# Patient Record
Sex: Female | Born: 1980
Health system: Southern US, Community
[De-identification: ages and names within clinical notes are randomized; demographics above are authoritative.]

## PROBLEM LIST (undated history)

## (undated) ENCOUNTER — Emergency Department (HOSPITAL_BASED_OUTPATIENT_CLINIC_OR_DEPARTMENT_OTHER): Admission: EM | Payer: BLUE CROSS/BLUE SHIELD | Source: Home / Self Care

## (undated) DIAGNOSIS — M1990 Unspecified osteoarthritis, unspecified site: Secondary | ICD-10-CM

## (undated) DIAGNOSIS — G932 Benign intracranial hypertension: Secondary | ICD-10-CM

## (undated) DIAGNOSIS — Z87442 Personal history of urinary calculi: Secondary | ICD-10-CM

## (undated) DIAGNOSIS — Z9889 Other specified postprocedural states: Secondary | ICD-10-CM

## (undated) DIAGNOSIS — Z8719 Personal history of other diseases of the digestive system: Secondary | ICD-10-CM

## (undated) DIAGNOSIS — G43909 Migraine, unspecified, not intractable, without status migrainosus: Secondary | ICD-10-CM

## (undated) DIAGNOSIS — F32A Depression, unspecified: Secondary | ICD-10-CM

## (undated) DIAGNOSIS — F419 Anxiety disorder, unspecified: Secondary | ICD-10-CM

## (undated) DIAGNOSIS — M722 Plantar fascial fibromatosis: Secondary | ICD-10-CM

## (undated) DIAGNOSIS — R112 Nausea with vomiting, unspecified: Secondary | ICD-10-CM

## (undated) DIAGNOSIS — Z8711 Personal history of peptic ulcer disease: Secondary | ICD-10-CM

## (undated) DIAGNOSIS — E785 Hyperlipidemia, unspecified: Secondary | ICD-10-CM

## (undated) DIAGNOSIS — T7840XA Allergy, unspecified, initial encounter: Secondary | ICD-10-CM

## (undated) DIAGNOSIS — D649 Anemia, unspecified: Secondary | ICD-10-CM

## (undated) DIAGNOSIS — J329 Chronic sinusitis, unspecified: Secondary | ICD-10-CM

## (undated) DIAGNOSIS — Z8489 Family history of other specified conditions: Secondary | ICD-10-CM

## (undated) DIAGNOSIS — F329 Major depressive disorder, single episode, unspecified: Secondary | ICD-10-CM

## (undated) DIAGNOSIS — K589 Irritable bowel syndrome without diarrhea: Secondary | ICD-10-CM

## (undated) DIAGNOSIS — M869 Osteomyelitis, unspecified: Secondary | ICD-10-CM

## (undated) DIAGNOSIS — T17308A Unspecified foreign body in larynx causing other injury, initial encounter: Secondary | ICD-10-CM

## (undated) DIAGNOSIS — I739 Peripheral vascular disease, unspecified: Secondary | ICD-10-CM

## (undated) DIAGNOSIS — R1011 Right upper quadrant pain: Secondary | ICD-10-CM

## (undated) DIAGNOSIS — Z972 Presence of dental prosthetic device (complete) (partial): Secondary | ICD-10-CM

## (undated) DIAGNOSIS — F909 Attention-deficit hyperactivity disorder, unspecified type: Secondary | ICD-10-CM

## (undated) DIAGNOSIS — G4733 Obstructive sleep apnea (adult) (pediatric): Secondary | ICD-10-CM

## (undated) DIAGNOSIS — K219 Gastro-esophageal reflux disease without esophagitis: Secondary | ICD-10-CM

## (undated) DIAGNOSIS — N393 Stress incontinence (female) (male): Secondary | ICD-10-CM

## (undated) DIAGNOSIS — N946 Dysmenorrhea, unspecified: Secondary | ICD-10-CM

## (undated) DIAGNOSIS — M899 Disorder of bone, unspecified: Secondary | ICD-10-CM

## (undated) DIAGNOSIS — Z973 Presence of spectacles and contact lenses: Secondary | ICD-10-CM

## (undated) DIAGNOSIS — M949 Disorder of cartilage, unspecified: Secondary | ICD-10-CM

## (undated) HISTORY — DX: Major depressive disorder, single episode, unspecified: F32.9

## (undated) HISTORY — DX: Hyperlipidemia, unspecified: E78.5

## (undated) HISTORY — DX: Depression, unspecified: F32.A

## (undated) HISTORY — PX: ANKLE ARTHROSCOPY: SHX545

## (undated) HISTORY — DX: Allergy, unspecified, initial encounter: T78.40XA

## (undated) HISTORY — DX: Right upper quadrant pain: R10.11

## (undated) HISTORY — DX: Irritable bowel syndrome, unspecified: K58.9

## (undated) HISTORY — DX: Attention-deficit hyperactivity disorder, unspecified type: F90.9

## (undated) HISTORY — PX: BLADDER SURGERY: SHX569

## (undated) HISTORY — DX: Unspecified foreign body in larynx causing other injury, initial encounter: T17.308A

## (undated) HISTORY — PX: TONSILLECTOMY AND ADENOIDECTOMY: SUR1326

## (undated) HISTORY — PX: WISDOM TOOTH EXTRACTION: SHX21

---
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
## 2001-03-22 ENCOUNTER — Other Ambulatory Visit: Admission: RE | Admit: 2001-03-22 | Discharge: 2001-03-22 | Payer: Self-pay | Admitting: Obstetrics and Gynecology

## 2001-03-23 ENCOUNTER — Encounter (INDEPENDENT_AMBULATORY_CARE_PROVIDER_SITE_OTHER): Payer: Self-pay | Admitting: Specialist

## 2001-03-23 ENCOUNTER — Other Ambulatory Visit: Admission: RE | Admit: 2001-03-23 | Discharge: 2001-03-23 | Payer: Self-pay | Admitting: Obstetrics and Gynecology

## 2001-11-15 ENCOUNTER — Other Ambulatory Visit: Admission: RE | Admit: 2001-11-15 | Discharge: 2001-11-15 | Payer: Self-pay | Admitting: Obstetrics and Gynecology

## 2001-12-29 ENCOUNTER — Other Ambulatory Visit: Admission: RE | Admit: 2001-12-29 | Discharge: 2001-12-29 | Payer: Self-pay | Admitting: Obstetrics and Gynecology

## 2002-01-28 ENCOUNTER — Ambulatory Visit (HOSPITAL_COMMUNITY): Admission: RE | Admit: 2002-01-28 | Discharge: 2002-01-28 | Payer: Self-pay | Admitting: Gastroenterology

## 2002-01-29 ENCOUNTER — Emergency Department (HOSPITAL_COMMUNITY): Admission: EM | Admit: 2002-01-29 | Discharge: 2002-01-29 | Payer: Self-pay | Admitting: Emergency Medicine

## 2002-03-31 ENCOUNTER — Ambulatory Visit (HOSPITAL_COMMUNITY): Admission: RE | Admit: 2002-03-31 | Discharge: 2002-03-31 | Payer: Self-pay | Admitting: Gastroenterology

## 2002-09-29 ENCOUNTER — Other Ambulatory Visit: Admission: RE | Admit: 2002-09-29 | Discharge: 2002-09-29 | Payer: Self-pay | Admitting: Obstetrics and Gynecology

## 2002-11-07 ENCOUNTER — Emergency Department (HOSPITAL_COMMUNITY): Admission: EM | Admit: 2002-11-07 | Discharge: 2002-11-07 | Payer: Self-pay | Admitting: Emergency Medicine

## 2003-03-02 ENCOUNTER — Ambulatory Visit (HOSPITAL_COMMUNITY): Admission: RE | Admit: 2003-03-02 | Discharge: 2003-03-02 | Payer: Self-pay | Admitting: Pediatrics

## 2003-03-23 ENCOUNTER — Other Ambulatory Visit: Admission: RE | Admit: 2003-03-23 | Discharge: 2003-03-23 | Payer: Self-pay | Admitting: Obstetrics and Gynecology

## 2004-03-20 ENCOUNTER — Observation Stay (HOSPITAL_COMMUNITY): Admission: AD | Admit: 2004-03-20 | Discharge: 2004-03-21 | Payer: Self-pay | Admitting: Pediatrics

## 2004-06-19 ENCOUNTER — Encounter: Admission: RE | Admit: 2004-06-19 | Discharge: 2004-06-19 | Payer: Self-pay | Admitting: Internal Medicine

## 2004-08-19 ENCOUNTER — Other Ambulatory Visit: Admission: RE | Admit: 2004-08-19 | Discharge: 2004-08-19 | Payer: Self-pay | Admitting: Obstetrics and Gynecology

## 2004-10-16 ENCOUNTER — Ambulatory Visit: Payer: Self-pay | Admitting: Family Medicine

## 2004-11-13 ENCOUNTER — Ambulatory Visit: Payer: Self-pay | Admitting: Internal Medicine

## 2004-12-25 ENCOUNTER — Encounter: Admission: RE | Admit: 2004-12-25 | Discharge: 2004-12-25 | Payer: Self-pay | Admitting: Obstetrics and Gynecology

## 2005-01-11 ENCOUNTER — Ambulatory Visit: Payer: Self-pay | Admitting: Family Medicine

## 2005-01-19 ENCOUNTER — Emergency Department (HOSPITAL_COMMUNITY): Admission: EM | Admit: 2005-01-19 | Discharge: 2005-01-20 | Payer: Self-pay | Admitting: Emergency Medicine

## 2005-01-20 ENCOUNTER — Emergency Department (HOSPITAL_COMMUNITY): Admission: EM | Admit: 2005-01-20 | Discharge: 2005-01-20 | Payer: Self-pay | Admitting: Emergency Medicine

## 2005-01-31 ENCOUNTER — Ambulatory Visit: Payer: Self-pay | Admitting: Internal Medicine

## 2005-04-22 ENCOUNTER — Encounter: Admission: RE | Admit: 2005-04-22 | Discharge: 2005-04-22 | Payer: Self-pay | Admitting: Obstetrics and Gynecology

## 2005-04-24 ENCOUNTER — Ambulatory Visit: Payer: Self-pay | Admitting: Internal Medicine

## 2005-07-04 ENCOUNTER — Ambulatory Visit: Payer: Self-pay | Admitting: Hematology & Oncology

## 2005-09-02 ENCOUNTER — Ambulatory Visit: Payer: Self-pay | Admitting: Hematology & Oncology

## 2005-10-31 ENCOUNTER — Ambulatory Visit: Payer: Self-pay | Admitting: Hematology & Oncology

## 2005-11-01 ENCOUNTER — Emergency Department (HOSPITAL_COMMUNITY): Admission: EM | Admit: 2005-11-01 | Discharge: 2005-11-02 | Payer: Self-pay | Admitting: Emergency Medicine

## 2005-11-03 ENCOUNTER — Inpatient Hospital Stay (HOSPITAL_COMMUNITY): Admission: AD | Admit: 2005-11-03 | Discharge: 2005-11-06 | Payer: Self-pay | Admitting: Pediatrics

## 2005-12-03 ENCOUNTER — Ambulatory Visit: Payer: Self-pay | Admitting: Internal Medicine

## 2006-02-13 ENCOUNTER — Ambulatory Visit: Payer: Self-pay | Admitting: Internal Medicine

## 2006-07-31 ENCOUNTER — Ambulatory Visit: Payer: Self-pay | Admitting: Internal Medicine

## 2006-07-31 ENCOUNTER — Encounter: Admission: RE | Admit: 2006-07-31 | Discharge: 2006-07-31 | Payer: Self-pay | Admitting: Internal Medicine

## 2006-08-01 ENCOUNTER — Emergency Department (HOSPITAL_COMMUNITY): Admission: EM | Admit: 2006-08-01 | Discharge: 2006-08-02 | Payer: Self-pay | Admitting: Emergency Medicine

## 2006-08-01 ENCOUNTER — Ambulatory Visit: Payer: Self-pay | Admitting: Family Medicine

## 2006-08-10 ENCOUNTER — Ambulatory Visit (HOSPITAL_BASED_OUTPATIENT_CLINIC_OR_DEPARTMENT_OTHER): Admission: RE | Admit: 2006-08-10 | Discharge: 2006-08-10 | Payer: Self-pay | Admitting: Urology

## 2006-08-10 HISTORY — PX: CYSTOSCOPY WITH RETROGRADE PYELOGRAM, URETEROSCOPY AND STENT PLACEMENT: SHX5789

## 2006-12-17 ENCOUNTER — Ambulatory Visit: Payer: Self-pay | Admitting: Internal Medicine

## 2007-03-05 HISTORY — PX: OVARIAN CYST SURGERY: SHX726

## 2007-03-09 ENCOUNTER — Ambulatory Visit: Payer: Self-pay | Admitting: Internal Medicine

## 2007-06-07 ENCOUNTER — Telehealth: Payer: Self-pay | Admitting: Internal Medicine

## 2007-07-06 ENCOUNTER — Telehealth: Payer: Self-pay | Admitting: Internal Medicine

## 2007-07-09 DIAGNOSIS — R51 Headache: Secondary | ICD-10-CM | POA: Insufficient documentation

## 2007-07-09 DIAGNOSIS — R519 Headache, unspecified: Secondary | ICD-10-CM

## 2007-07-09 HISTORY — DX: Headache, unspecified: R51.9

## 2007-07-15 ENCOUNTER — Ambulatory Visit: Payer: Self-pay | Admitting: Internal Medicine

## 2007-07-17 ENCOUNTER — Emergency Department (HOSPITAL_COMMUNITY): Admission: EM | Admit: 2007-07-17 | Discharge: 2007-07-17 | Payer: Self-pay | Admitting: Emergency Medicine

## 2007-08-18 ENCOUNTER — Ambulatory Visit: Payer: Self-pay | Admitting: Internal Medicine

## 2007-11-09 ENCOUNTER — Ambulatory Visit: Payer: Self-pay | Admitting: Internal Medicine

## 2007-11-09 DIAGNOSIS — F411 Generalized anxiety disorder: Secondary | ICD-10-CM | POA: Insufficient documentation

## 2007-11-23 ENCOUNTER — Telehealth: Payer: Self-pay | Admitting: Internal Medicine

## 2007-12-14 ENCOUNTER — Ambulatory Visit: Payer: Self-pay | Admitting: Internal Medicine

## 2007-12-21 ENCOUNTER — Encounter: Payer: Self-pay | Admitting: Internal Medicine

## 2007-12-24 ENCOUNTER — Encounter: Payer: Self-pay | Admitting: Internal Medicine

## 2007-12-25 ENCOUNTER — Emergency Department (HOSPITAL_COMMUNITY): Admission: EM | Admit: 2007-12-25 | Discharge: 2007-12-26 | Payer: Self-pay | Admitting: Emergency Medicine

## 2008-01-04 ENCOUNTER — Telehealth: Payer: Self-pay | Admitting: Internal Medicine

## 2008-01-08 ENCOUNTER — Ambulatory Visit: Payer: Self-pay | Admitting: Internal Medicine

## 2008-01-09 ENCOUNTER — Telehealth: Payer: Self-pay | Admitting: Internal Medicine

## 2008-01-11 ENCOUNTER — Ambulatory Visit: Payer: Self-pay | Admitting: Internal Medicine

## 2008-01-18 ENCOUNTER — Telehealth: Payer: Self-pay | Admitting: Internal Medicine

## 2008-01-18 ENCOUNTER — Encounter: Payer: Self-pay | Admitting: Internal Medicine

## 2008-01-20 ENCOUNTER — Ambulatory Visit: Payer: Self-pay | Admitting: Internal Medicine

## 2008-01-25 ENCOUNTER — Ambulatory Visit: Payer: Self-pay | Admitting: Internal Medicine

## 2008-01-25 DIAGNOSIS — R635 Abnormal weight gain: Secondary | ICD-10-CM | POA: Insufficient documentation

## 2008-01-25 DIAGNOSIS — E669 Obesity, unspecified: Secondary | ICD-10-CM | POA: Insufficient documentation

## 2008-01-26 ENCOUNTER — Telehealth (INDEPENDENT_AMBULATORY_CARE_PROVIDER_SITE_OTHER): Payer: Self-pay | Admitting: *Deleted

## 2008-01-27 LAB — CONVERTED CEMR LAB
ALT: 95 units/L — ABNORMAL HIGH (ref 0–35)
AST: 68 units/L — ABNORMAL HIGH (ref 0–37)
Alkaline Phosphatase: 74 units/L (ref 39–117)
BUN: 13 mg/dL (ref 6–23)
Basophils Absolute: 0 10*3/uL (ref 0.0–0.1)
Calcium: 9.3 mg/dL (ref 8.4–10.5)
Creatinine, Ser: 0.7 mg/dL (ref 0.4–1.2)
Eosinophils Absolute: 0.4 10*3/uL (ref 0.0–0.6)
GFR calc Af Amer: 131 mL/min
Glucose, Bld: 100 mg/dL — ABNORMAL HIGH (ref 70–99)
MCHC: 33.8 g/dL (ref 30.0–36.0)
MCV: 88.3 fL (ref 78.0–100.0)
Monocytes Absolute: 0.3 10*3/uL (ref 0.2–0.7)
Neutro Abs: 5.4 10*3/uL (ref 1.4–7.7)
Potassium: 4.4 meq/L (ref 3.5–5.1)
RDW: 13.6 % (ref 11.5–14.6)
Sodium: 137 meq/L (ref 135–145)
Total Protein: 6.5 g/dL (ref 6.0–8.3)

## 2008-02-09 ENCOUNTER — Encounter: Payer: Self-pay | Admitting: Internal Medicine

## 2008-02-14 ENCOUNTER — Ambulatory Visit: Payer: Self-pay | Admitting: Internal Medicine

## 2008-02-14 LAB — CONVERTED CEMR LAB
Bilirubin Urine: NEGATIVE
Inflenza A Ag: NEGATIVE
Nitrite: NEGATIVE
Urobilinogen, UA: 0.2

## 2008-02-16 ENCOUNTER — Ambulatory Visit: Payer: Self-pay | Admitting: Internal Medicine

## 2008-02-17 ENCOUNTER — Telehealth: Payer: Self-pay | Admitting: Internal Medicine

## 2008-02-18 LAB — CONVERTED CEMR LAB
ALT: 21 units/L (ref 0–35)
Alkaline Phosphatase: 61 units/L (ref 39–117)
Basophils Absolute: 0 10*3/uL (ref 0.0–0.1)
CO2: 28 meq/L (ref 19–32)
Calcium: 8.9 mg/dL (ref 8.4–10.5)
Eosinophils Absolute: 0.3 10*3/uL (ref 0.0–0.7)
Eosinophils Relative: 4.7 % (ref 0.0–5.0)
GFR calc Af Amer: 98 mL/min
Glucose, Bld: 95 mg/dL (ref 70–99)
Hemoglobin: 12.4 g/dL (ref 12.0–15.0)
Lymphocytes Relative: 33.8 % (ref 12.0–46.0)
Monocytes Absolute: 0.5 10*3/uL (ref 0.1–1.0)
Monocytes Relative: 8.5 % (ref 3.0–12.0)
Neutro Abs: 2.8 10*3/uL (ref 1.4–7.7)
Sodium: 137 meq/L (ref 135–145)

## 2008-03-08 ENCOUNTER — Encounter: Payer: Self-pay | Admitting: Internal Medicine

## 2008-03-11 ENCOUNTER — Emergency Department (HOSPITAL_COMMUNITY): Admission: EM | Admit: 2008-03-11 | Discharge: 2008-03-11 | Payer: Self-pay | Admitting: Emergency Medicine

## 2008-03-16 ENCOUNTER — Telehealth: Payer: Self-pay | Admitting: Internal Medicine

## 2008-03-28 ENCOUNTER — Ambulatory Visit: Payer: Self-pay | Admitting: Internal Medicine

## 2008-03-28 DIAGNOSIS — M549 Dorsalgia, unspecified: Secondary | ICD-10-CM | POA: Insufficient documentation

## 2008-03-28 LAB — CONVERTED CEMR LAB
Bilirubin Urine: NEGATIVE
Blood in Urine, dipstick: NEGATIVE
Glucose, Urine, Semiquant: NEGATIVE
Nitrite: NEGATIVE
Protein, U semiquant: NEGATIVE
Urobilinogen, UA: 0.2
pH: 5.5

## 2008-04-13 ENCOUNTER — Encounter: Admission: RE | Admit: 2008-04-13 | Discharge: 2008-04-13 | Payer: Self-pay | Admitting: Neurology

## 2008-04-17 ENCOUNTER — Telehealth: Payer: Self-pay | Admitting: Internal Medicine

## 2008-04-18 ENCOUNTER — Ambulatory Visit (HOSPITAL_COMMUNITY): Admission: RE | Admit: 2008-04-18 | Discharge: 2008-04-18 | Payer: Self-pay | Admitting: Neurology

## 2008-04-20 ENCOUNTER — Encounter: Payer: Self-pay | Admitting: Internal Medicine

## 2008-04-24 ENCOUNTER — Encounter (INDEPENDENT_AMBULATORY_CARE_PROVIDER_SITE_OTHER): Payer: Self-pay | Admitting: Radiology

## 2008-04-24 ENCOUNTER — Ambulatory Visit (HOSPITAL_COMMUNITY): Admission: RE | Admit: 2008-04-24 | Discharge: 2008-04-24 | Payer: Self-pay | Admitting: Neurology

## 2008-04-25 ENCOUNTER — Encounter: Payer: Self-pay | Admitting: Internal Medicine

## 2008-05-02 ENCOUNTER — Encounter: Admission: RE | Admit: 2008-05-02 | Discharge: 2008-05-02 | Payer: Self-pay | Admitting: Neurology

## 2008-05-03 ENCOUNTER — Telehealth: Payer: Self-pay | Admitting: Internal Medicine

## 2008-05-11 ENCOUNTER — Encounter: Payer: Self-pay | Admitting: Internal Medicine

## 2008-05-16 ENCOUNTER — Encounter: Payer: Self-pay | Admitting: Internal Medicine

## 2008-05-23 ENCOUNTER — Encounter: Payer: Self-pay | Admitting: Internal Medicine

## 2008-05-24 ENCOUNTER — Encounter: Payer: Self-pay | Admitting: Internal Medicine

## 2008-06-08 ENCOUNTER — Encounter: Payer: Self-pay | Admitting: Internal Medicine

## 2008-06-16 ENCOUNTER — Ambulatory Visit: Payer: Self-pay | Admitting: Internal Medicine

## 2008-06-16 DIAGNOSIS — G932 Benign intracranial hypertension: Secondary | ICD-10-CM

## 2008-06-16 HISTORY — DX: Benign intracranial hypertension: G93.2

## 2008-06-16 LAB — CONVERTED CEMR LAB
Bilirubin Urine: NEGATIVE
Ketones, urine, test strip: NEGATIVE
Protein, U semiquant: NEGATIVE
pH: 5.5

## 2008-06-18 LAB — CONVERTED CEMR LAB
BUN: 15 mg/dL (ref 6–23)
Calcium: 8.6 mg/dL (ref 8.4–10.5)
Chloride: 111 meq/L (ref 96–112)
Creatinine, Ser: 0.68 mg/dL (ref 0.40–1.20)
Eosinophils Absolute: 0.5 10*3/uL (ref 0.0–0.7)
Eosinophils Relative: 8 % — ABNORMAL HIGH (ref 0–5)
HCT: 33.5 % — ABNORMAL LOW (ref 36.0–46.0)
Sodium, Ur: 58 meq/L
Sodium: 138 meq/L (ref 135–145)

## 2008-06-21 ENCOUNTER — Telehealth: Payer: Self-pay | Admitting: Internal Medicine

## 2008-06-22 ENCOUNTER — Encounter: Payer: Self-pay | Admitting: Internal Medicine

## 2008-06-26 ENCOUNTER — Encounter: Payer: Self-pay | Admitting: Internal Medicine

## 2008-06-30 ENCOUNTER — Emergency Department (HOSPITAL_COMMUNITY): Admission: EM | Admit: 2008-06-30 | Discharge: 2008-06-30 | Payer: Self-pay | Admitting: Emergency Medicine

## 2008-07-07 ENCOUNTER — Encounter: Payer: Self-pay | Admitting: Internal Medicine

## 2008-09-01 ENCOUNTER — Ambulatory Visit: Payer: Self-pay | Admitting: Family Medicine

## 2008-09-01 DIAGNOSIS — J209 Acute bronchitis, unspecified: Secondary | ICD-10-CM | POA: Insufficient documentation

## 2008-09-29 ENCOUNTER — Telehealth: Payer: Self-pay | Admitting: Family Medicine

## 2008-10-02 ENCOUNTER — Ambulatory Visit: Payer: Self-pay | Admitting: Family Medicine

## 2008-10-02 DIAGNOSIS — F1191 Opioid use, unspecified, in remission: Secondary | ICD-10-CM | POA: Insufficient documentation

## 2008-10-02 DIAGNOSIS — F1120 Opioid dependence, uncomplicated: Secondary | ICD-10-CM | POA: Insufficient documentation

## 2008-10-02 DIAGNOSIS — F32A Depression, unspecified: Secondary | ICD-10-CM | POA: Insufficient documentation

## 2008-10-02 DIAGNOSIS — F322 Major depressive disorder, single episode, severe without psychotic features: Secondary | ICD-10-CM | POA: Insufficient documentation

## 2008-10-04 ENCOUNTER — Ambulatory Visit: Payer: Self-pay | Admitting: Family Medicine

## 2008-10-06 ENCOUNTER — Telehealth: Payer: Self-pay | Admitting: Family Medicine

## 2008-10-09 ENCOUNTER — Telehealth: Payer: Self-pay | Admitting: Family Medicine

## 2008-10-23 ENCOUNTER — Encounter: Payer: Self-pay | Admitting: Family Medicine

## 2008-11-21 ENCOUNTER — Ambulatory Visit: Payer: Self-pay | Admitting: Family Medicine

## 2008-11-29 ENCOUNTER — Ambulatory Visit: Payer: Self-pay | Admitting: Family Medicine

## 2008-12-26 ENCOUNTER — Ambulatory Visit: Payer: Self-pay | Admitting: Family Medicine

## 2009-01-15 ENCOUNTER — Other Ambulatory Visit: Admission: RE | Admit: 2009-01-15 | Discharge: 2009-01-15 | Payer: Self-pay | Admitting: Obstetrics & Gynecology

## 2009-05-11 ENCOUNTER — Ambulatory Visit (HOSPITAL_COMMUNITY): Admission: RE | Admit: 2009-05-11 | Discharge: 2009-05-11 | Payer: Self-pay | Admitting: Obstetrics & Gynecology

## 2009-08-03 ENCOUNTER — Ambulatory Visit: Payer: Self-pay | Admitting: Family Medicine

## 2009-08-18 ENCOUNTER — Emergency Department (HOSPITAL_COMMUNITY): Admission: EM | Admit: 2009-08-18 | Discharge: 2009-08-19 | Payer: Self-pay | Admitting: Emergency Medicine

## 2009-12-24 ENCOUNTER — Inpatient Hospital Stay (HOSPITAL_COMMUNITY): Admission: RE | Admit: 2009-12-24 | Discharge: 2009-12-27 | Payer: Self-pay | Admitting: Obstetrics and Gynecology

## 2009-12-30 ENCOUNTER — Encounter: Admission: RE | Admit: 2009-12-30 | Discharge: 2010-01-07 | Payer: Self-pay | Admitting: Obstetrics and Gynecology

## 2010-04-02 ENCOUNTER — Telehealth: Payer: Self-pay | Admitting: Family Medicine

## 2010-05-01 ENCOUNTER — Telehealth: Payer: Self-pay | Admitting: Family Medicine

## 2010-07-01 ENCOUNTER — Encounter: Admission: RE | Admit: 2010-07-01 | Discharge: 2010-07-01 | Payer: Self-pay | Admitting: Obstetrics and Gynecology

## 2010-07-02 ENCOUNTER — Encounter: Admission: RE | Admit: 2010-07-02 | Discharge: 2010-07-02 | Payer: Self-pay | Admitting: Obstetrics and Gynecology

## 2010-11-29 ENCOUNTER — Ambulatory Visit
Admission: RE | Admit: 2010-11-29 | Discharge: 2010-11-29 | Payer: Self-pay | Source: Home / Self Care | Attending: Family Medicine | Admitting: Family Medicine

## 2010-12-05 ENCOUNTER — Ambulatory Visit
Admission: RE | Admit: 2010-12-05 | Discharge: 2010-12-05 | Payer: Self-pay | Source: Home / Self Care | Attending: Internal Medicine | Admitting: Internal Medicine

## 2010-12-08 ENCOUNTER — Encounter: Payer: Self-pay | Admitting: Internal Medicine

## 2010-12-09 ENCOUNTER — Encounter: Payer: Self-pay | Admitting: Obstetrics & Gynecology

## 2010-12-16 ENCOUNTER — Telehealth: Payer: Self-pay | Admitting: Family Medicine

## 2010-12-17 NOTE — Progress Notes (Signed)
Summary: refill Topamax  Phone Note Call from Patient   Caller: Patient Call For: [name] Summary of Call: Pt is asking that [name] refill her Topamax.  She is completely out, and her Neurologist will not fill it until she sees them.  They gave her an appt in 2 weeks, and she cannot wait that long. Please call. [phone] Initial call taken by: Lynann Beaver CMA,  Apr 02, 2010 12:24 PM  Follow-up for Phone Call        what is her dose?  Topamax 200 mg. one q hs [facility] ([place])  LMTCB for dosage and pharmacy.   Follow-up by: [name],  Apr 02, 2010 1:21 PM  Additional Follow-up for Phone Call Additional follow up Details #1::        call in #30 with no rf Additional Follow-up by: [name],  Apr 02, 2010 2:34 PM    New/Updated Medications: TOPAMAX 200 MG TABS (TOPIRAMATE) 1 at bedtime Prescriptions: TOPAMAX 200 MG TABS (TOPIRAMATE) 1 at bedtime  #30 x 0   Entered by:   Raechel Ache, RN   Authorized by:   [name]   Signed by:   Raechel Ache, RN on 04/02/2010   Method used:   Electronically to        [facility] [address] [phone]* (retail)       [address] [place], [place]  [address]       Ph: [phone]       Fax: [phone]   RxID:   [id number]

## 2010-12-17 NOTE — Progress Notes (Signed)
Summary: refill  Phone Note Call from Patient Call back at Home Phone [phone]   Caller: vm Reason for Call: Refill Medication Summary of Call: Topamax 200mg  [facility] [place].  Please call me back.   Initial call taken by: Rudy Jew, RN,  May 01, 2010 1:14 PM  Follow-up for Phone Call        this was being prescribed by her neurologist, so ask them  Follow-up by: [name],  May 02, 2010 8:20 AM  Additional Follow-up for Phone Call Additional follow up Details #1::        Phone Call Completed Additional Follow-up by: Raechel Ache, RN,  May 02, 2010 9:49 AM

## 2010-12-18 ENCOUNTER — Ambulatory Visit (INDEPENDENT_AMBULATORY_CARE_PROVIDER_SITE_OTHER): Payer: BC Managed Care – PPO | Admitting: Family Medicine

## 2010-12-18 ENCOUNTER — Encounter: Payer: Self-pay | Admitting: Family Medicine

## 2010-12-18 VITALS — BP 110/76 | HR 102 | Temp 98.5°F | Wt 158.0 lb

## 2010-12-18 DIAGNOSIS — J40 Bronchitis, not specified as acute or chronic: Secondary | ICD-10-CM

## 2010-12-18 MED ORDER — CHLORPHENIRAMINE-HYDROCODONE 8-10 MG/5ML PO LQCR: 5.0000 mL | Freq: Two times a day (BID) | ORAL | Status: DC | PRN

## 2010-12-18 MED ORDER — PREDNISONE (PAK) 10 MG PO TABS: ORAL_TABLET | ORAL | Status: DC

## 2010-12-18 NOTE — Progress Notes (Signed)
  Subjective:    Patient ID: Diane [name], female    DOB: 12-10-1980, 30 y.o.   MRN: [id number]  HPI Here for 2 days of ST, stuffy head, and a dry cough. No fever. She finished a course of Biaxin several weeks ago for a bronchitis, and this completely resolved.    Review of Systems  Constitutional: Negative.   HENT: Positive for sore throat.   Eyes: Negative.   Respiratory: Positive for cough. Negative for chest tightness, shortness of breath, wheezing and stridor.   Cardiovascular: Negative.        Objective:   Physical Exam  Constitutional: She appears well-developed and well-nourished.  HENT:  Head: Normocephalic and atraumatic.  Right Ear: External ear normal.  Left Ear: External ear normal.  Nose: Nose normal.  Mouth/Throat: Oropharynx is clear and moist.  Eyes: Conjunctivae and EOM are normal.  Neck: Normal range of motion. Neck supple. No thyromegaly present.  Cardiovascular: Normal rate, regular rhythm and normal heart sounds.  Exam reveals no gallop and no friction rub.   No murmur heard. Pulmonary/Chest: Effort normal and breath sounds normal. No respiratory distress. She has no wheezes. She has no rales. She exhibits no tenderness.  Lymphadenopathy:    She has no cervical adenopathy.          Assessment & Plan:  This sounds like a viral URI. Drink fluids. Try a steroid dose pack and cough meds.

## 2010-12-19 NOTE — Miscellaneous (Signed)
  Medications Added TARON-C DHA 53.5-38-1 MG CAPS (PRENAT-FEFUM-FEPO-FA-OMEGA 3) once daily       Clinical Lists Changes  Medications: Added new medication of TARON-C DHA 53.5-38-1 MG CAPS (PRENAT-FEFUM-FEPO-FA-OMEGA 3) once daily - Signed Removed medication of GNP PRENATAL VITAMINS   TABS (PRENATAL VIT-FE FUMARATE-FA) once daily Rx of TARON-C DHA 53.5-38-1 MG CAPS (PRENAT-FEFUM-FEPO-FA-OMEGA 3) once daily;  #30 x 6;  Signed;  Entered by: Kyung Rudd, CMA;  Authorized by: Edwyna Perfect MD;  Method used[facility]  [place] [address], [place], [place]  [address], Ph: [phone] or [phone], Fax: [phone]    Prescriptions: TARON-C DHA 53.5-38-1 MG CAPS (PRENAT-FEFUM-FEPO-FA-OMEGA 3) once daily  #30 x 6   Entered by:   Kyung Rudd, CMA   Authorized by:   Edwyna Perfect MD   Signed by:   Kyung Rudd, CMA on 12/12/2010   Method used:   [facility]        [facility]  [place] [address] [facility]       [address]       [place], [place]  [address]       Ph: [phone] or [phone]       Fax: [phone]   RxID:   [id number]

## 2010-12-19 NOTE — Assessment & Plan Note (Signed)
Summary: vomitting/diarrhea/bronchitus/cjr   Vital Signs:  Patient profile:   30 year old female Weight:      158 pounds O2 Sat:      97 % on Room air Temp:     98.1 degrees F oral Pulse rate:   97 / minute Pulse rhythm:   regular BP supine:   100 / 70  (right arm)  Vitals Entered By: Kyung Rudd, CMA (December 05, 2010 11:44 AM)  O2 Flow:  Room air CC: pt c/o vomiting and diarrhea this morning, has had bronchitis for 2wks    CC:  pt c/o vomiting and diarrhea this morning and has had bronchitis for 2wks .  History of Present Illness: Patient presents to clinic as a workin for evaluation of n/v. Notes 1d h/o n/v/d. +sick exposure with child having diarrhea. Recently treated for bronchitis with augmentin now biaxin. Sx predated abx.  Requests rf of tussionex for continued cough. No dizziness/syncope. Unable to keep all medications down.   Current Medications (verified): 1)  Prevacid 30 Mg  Cpdr (Lansoprazole) .... Take 1 Tablet By Mouth Twice A Day 2)  Labetalol Hcl 200 Mg Tabs (Labetalol Hcl) .... Take Half Tab By Mouth Two Times A Day 3)  Ferrous Sulfate 325 (65 Fe) Mg  Tabs (Ferrous Sulfate) .... One Tab By Mouth Two Times A Day 4)  Gnp Prenatal Vitamins   Tabs (Prenatal Vit-Fe Fumarate-Fa) .... Once Daily 5)  Wellbutrin Xl 300 Mg Xr24h-Tab (Bupropion Hcl) .... Once Daily 6)  Topamax 200 Mg Tabs (Topiramate) .Marland Kitchen.. 1 At Bedtime 7)  Biaxin 500 Mg Tabs (Clarithromycin) .... Two Times A Day 8)  Proair Hfa 108 (90 Base) Mcg/act Aers (Albuterol Sulfate) .... 2 Puffs Q 4 Hours As Needed For Sob 9)  Hydromet 5-1.5 Mg/77ml Syrp (Hydrocodone-Homatropine) .Marland Kitchen.. 1 Tsp Q 4 Hours As Needed Cough  Allergies (verified): 1)  ! Reglan 2)  ! Compazine  Past History:  Past medical, surgical, family and social histories (including risk factors) reviewed for relevance to current acute and chronic problems.  Past Medical History: Reviewed history from 08/03/2009 and no changes  required. ADD Depression ? PTSD IBS Headache, sees Dr. Annia Belt at [facility] narcotic abuse sees Dr. Billy Coast for ObGyn care pseudotumor cerebri  Past Surgical History: Reviewed history from 07/09/2007 and no changes required. EGD-03/31/2002  Family History: Reviewed history from 07/09/2007 and no changes required. Family History of Alcoholism/Addiction Family History of Arthritis Family Hsitory Headaches Family History of Melanoma Fam hx Cerebral aneurysm Father-brain tumor  Social History: Reviewed history from 01/08/2008 and no changes required. Married Alcohol use-yes former smoker-2009  Review of Systems General:  Complains of chills and weakness; denies fever and sweats. Eyes:  Denies discharge, light sensitivity, and red eye. ENT:  Denies ear discharge, earache, and sinus pressure. Resp:  Complains of cough and wheezing; denies chest discomfort, coughing up blood, and shortness of breath. GI:  Complains of diarrhea, nausea, and vomiting; denies abdominal pain, bloody stools, dark tarry stools, vomiting blood, and yellowish skin color.  Physical Exam  General:  Well-developed,well-nourished,in no acute distress; alert,appropriate and cooperative throughout examinationwell-hydrated.   Head:  Normocephalic and atraumatic without obvious abnormalities. No apparent alopecia or balding. Eyes:  pupils equal, pupils round, and corneas and lenses clear.   Ears:  External ear exam shows no significant lesions or deformities.  Otoscopic examination reveals clear canals, tympanic membranes are intact bilaterally without bulging, retraction, inflammation or discharge. Hearing is grossly normal bilaterally. Nose:  External  nasal examination shows no deformity or inflammation. Nasal mucosa are pink and moist without lesions or exudates. Mouth:  Oral mucosa and oropharynx without lesions or exudates.  Teeth in good repair. Neck:  No deformities, masses, or tenderness  noted. Lungs:  Normal respiratory effort, chest expands symmetrically. Lungs are clear to auscultation, no crackles or wheezes. Heart:  Normal rate and regular rhythm. S1 and S2 normal without gallop, murmur, click, rub or other extra sounds. Abdomen:  Bowel sounds positive,abdomen soft and non-tender without masses, organomegaly or hernias noted. Neurologic:  alert & oriented X3 and gait normal.   Skin:  turgor normal, color normal, and no rashes.   Psych:  Oriented X3, normally interactive, good eye contact, not anxious appearing, and not depressed appearing.     Impression & Recommendations:  Problem # 1:  NAUSEA WITH VOMITING (ICD-787.01) Assessment New Suspect viral gastroenteritis.Phenergan IM given (has ride home). Phenregan supp as needed. Cautioned ZO:XWRUEAVW sedation. Maintain adequate hydration.Followup if no improvement or worsening.  Orders: Promethazine up to 50mg  (J2550) Admin of Therapeutic Inj  intramuscular or subcutaneous ([address])  Problem # 2:  ACUTE BRONCHITIS (ICD-466.0) Assessment: Unchanged Continue abx. Rf tussionex but cautioned with h/o narcotic dependence should be used very short term only. States understanding. Followup if no improvement or worsening.  The following medications were removed from the medication list:    Hydromet 5-1.5 Mg/76ml Syrp (Hydrocodone-homatropine) .Marland Kitchen... 1 tsp q 4 hours as needed cough Her updated medication list for this problem includes:    Biaxin 500 Mg Tabs (Clarithromycin) .Marland Kitchen..Marland Kitchen Two times a day    Proair Hfa 108 (90 Base) Mcg/act Aers (Albuterol sulfate) .Marland Kitchen... 2 puffs q 4 hours as needed for sob    Tussionex Pennkinetic Er 10-8 Mg/71ml Lqcr (Hydrocod polst-chlorphen polst) .Marland KitchenMarland KitchenMarland KitchenMarland Kitchen 5ml poq12 hours as needed cough  Complete Medication List: 1)  Prevacid 30 Mg Cpdr (Lansoprazole) .... Take 1 tablet by mouth twice a day 2)  Labetalol Hcl 200 Mg Tabs (Labetalol hcl) .... Take half tab by mouth two times a day 3)  Ferrous Sulfate 325  (65 Fe) Mg Tabs (Ferrous sulfate) .... One tab by mouth two times a day 4)  Gnp Prenatal Vitamins Tabs (Prenatal vit-fe fumarate-fa) .... Once daily 5)  Wellbutrin Xl 300 Mg Xr24h-tab (Bupropion hcl) .... Once daily 6)  Topamax 200 Mg Tabs (Topiramate) .Marland Kitchen.. 1 at bedtime 7)  Biaxin 500 Mg Tabs (Clarithromycin) .... Two times a day 8)  Proair Hfa 108 (90 Base) Mcg/act Aers (Albuterol sulfate) .... 2 puffs q 4 hours as needed for sob 9)  Tussionex Pennkinetic Er 10-8 Mg/71ml Lqcr (Hydrocod polst-chlorphen polst) .... 5ml poq12 hours as needed cough 10)  Promethazine Hcl 25 Mg Supp (Promethazine hcl) .Marland Kitchen.. 1 pr q 4-6 hours as needed n/v Prescriptions: WELLBUTRIN XL 300 MG XR24H-TAB (BUPROPION HCL) once daily  #30 Tablet x 3   Entered and Authorized by:   Edwyna Perfect MD   Signed by:   Edwyna Perfect MD on 12/05/2010   Method used:   [facility]  [address] [address]       [address]       [place], [place]  [address]       Ph: [phone] or [phone]       Fax: [phone]   RxID:   [id number] LABETALOL HCL 200 MG TABS (LABETALOL HCL) take half tab by mouth two times a day  #60 Tablet  x 3   Entered and Authorized by:   Edwyna Perfect MD   Signed by:   Edwyna Perfect MD on 12/05/2010   Method used:   [facility]  [place] [address]       [address]       [place], [place]  [address]       Ph: [phone] or [phone]       Fax: [phone]   RxID:   [id number] GNP PRENATAL VITAMINS   TABS (PRENATAL VIT-FE FUMARATE-FA) once daily  #30 x 6   Entered and Authorized by:   Edwyna Perfect MD   Signed by:   Edwyna Perfect MD on 12/05/2010   Method used:   [facility]        [facility]  [place] [address] [facility]       [address]       [place], [place]  [address]       Ph: [phone] or [phone]       Fax: [phone]   RxID:   [id number] FERROUS SULFATE 325 (65 FE) MG  TABS (FERROUS SULFATE) One tab by mouth  two times a day  #60 Tablet x 6   Entered and Authorized by:   Edwyna Perfect MD   Signed by:   Edwyna Perfect MD on 12/05/2010   Method used:   [facility]        [facility]  [place] [address]       [address]       [place], [place]  [address]       Ph: [phone] or [phone]       Fax: [phone]   RxID:   [id number] PROMETHAZINE HCL 25 MG SUPP (PROMETHAZINE HCL) 1 pr q 4-6 hours as needed n/v  #12 x 0   Entered and Authorized by:   Edwyna Perfect MD   Signed by:   Edwyna Perfect MD on 12/05/2010   Method used:   [facility]        [facility]  [place] [address]       [address]       [place], [place]  [address]       Ph: [phone] or [phone]       Fax: [phone]   RxID:   [id number] Sandria Senter ER 10-8 MG/5ML LQCR (HYDROCOD POLST-CHLORPHEN POLST) 5ml poq12 hours as needed cough  #2 ounces x 0   Entered and Authorized by:   Edwyna Perfect MD   Signed by:   Edwyna Perfect MD on 12/05/2010   Method used:   Print then Give to Patient   RxID:   [id number]    Medication Administration  Injection # 1:    Medication: Promethazine up to 50mg     Diagnosis: NAUSEA WITH VOMITING (ICD-787.01)    Route: IM    Site: RUOQ gluteus    Exp Date: 06/2012    Lot #: [id number]    Mfr: [employer]    Patient tolerated injection without complications    Given by: Kyung Rudd, CMA (December 05, 2010 12:56 PM)  Orders Added: 1)  Promethazine up to 50mg  [J2550] 2)  Admin of Therapeutic Inj  intramuscular or subcutaneous [[address]] 3)  Est. Patient Level IV [700[address]]

## 2010-12-19 NOTE — Assessment & Plan Note (Signed)
Summary: ? bronchitis//ccm   Vital Signs:  Patient profile:   30 year old female Weight:      161 pounds O2 Sat:      97 % Temp:     98.5 degrees F BP sitting:   120 / 76  (left arm) Cuff size:   regular  Vitals Entered By: Pura Spice, RN (November 29, 2010 3:18 PM) CC: cough sore throat went to [facility] weekend toldy had bronchitis heard something pop in rt ear    History of Present Illness: Here for 10 days of sinus pressure, ST, HA, chest congesiton, SOB, and coughing up yellow sputum. She saw Urgent Care last weekend and was given Augmentin. She has been on this for 5 days with no improvement. On fluids.   Allergies: 1)  ! Reglan 2)  ! Compazine  Past History:  Past Medical History: Reviewed history from 08/03/2009 and no changes required. ADD Depression ? PTSD IBS Headache, sees Dr. Annia Belt at [facility] narcotic abuse sees Dr. Billy Coast for ObGyn care pseudotumor cerebri  Review of Systems  The patient denies anorexia, weight loss, weight gain, vision loss, decreased hearing, hoarseness, syncope, dyspnea on exertion, peripheral edema, hemoptysis, abdominal pain, melena, hematochezia, severe indigestion/heartburn, hematuria, incontinence, genital sores, muscle weakness, suspicious skin lesions, transient blindness, difficulty walking, depression, unusual weight change, abnormal bleeding, enlarged lymph nodes, angioedema, breast masses, and testicular masses.    Physical Exam  General:  Well-developed,well-nourished,in no acute distress; alert,appropriate and cooperative throughout examination Head:  Normocephalic and atraumatic without obvious abnormalities. No apparent alopecia or balding. Eyes:  No corneal or conjunctival inflammation noted. EOMI. Perrla. Funduscopic exam benign, without hemorrhages, exudates or papilledema. Vision grossly normal. Ears:  External ear exam shows no significant lesions or deformities.  Otoscopic examination reveals  clear canals, tympanic membranes are intact bilaterally without bulging, retraction, inflammation or discharge. Hearing is grossly normal bilaterally. Nose:  External nasal examination shows no deformity or inflammation. Nasal mucosa are pink and moist without lesions or exudates. Mouth:  Oral mucosa and oropharynx without lesions or exudates.  Teeth in good repair. Neck:  No deformities, masses, or tenderness noted. Lungs:  scattered rhonchi and wheezes   Impression & Recommendations:  Problem # 1:  ACUTE BRONCHITIS (ICD-466.0)  Her updated medication list for this problem includes:    Biaxin 500 Mg Tabs (Clarithromycin) .Marland Kitchen..Marland Kitchen Two times a day    Proair Hfa 108 (90 Base) Mcg/act Aers (Albuterol sulfate) .Marland Kitchen... 2 puffs q 4 hours as needed for sob    Hydromet 5-1.5 Mg/8ml Syrp (Hydrocodone-homatropine) .Marland Kitchen... 1 tsp q 4 hours as needed cough  Complete Medication List: 1)  Prevacid 30 Mg Cpdr (Lansoprazole) .... Take 1 tablet by mouth twice a day 2)  Labetalol Hcl 200 Mg Tabs (Labetalol hcl) .... Take half tab by mouth two times a day 3)  Ferrous Sulfate 325 (65 Fe) Mg Tabs (Ferrous sulfate) .... One tab by mouth two times a day 4)  Gnp Prenatal Vitamins Tabs (Prenatal vit-fe fumarate-fa) .... Once daily 5)  Wellbutrin Xl 300 Mg Xr24h-tab (Bupropion hcl) .... Once daily 6)  Topamax 200 Mg Tabs (Topiramate) .Marland Kitchen.. 1 at bedtime 7)  Biaxin 500 Mg Tabs (Clarithromycin) .... Two times a day 8)  Proair Hfa 108 (90 Base) Mcg/act Aers (Albuterol sulfate) .... 2 puffs q 4 hours as needed for sob 9)  Hydromet 5-1.5 Mg/57ml Syrp (Hydrocodone-homatropine) .Marland Kitchen.. 1 tsp q 4 hours as needed cough  Other Orders: Depo- Medrol  40mg  (J1030) Depo- Medrol 80mg  (J1040) Admin of Therapeutic Inj  intramuscular or subcutaneous ([address])  Patient Instructions: 1)  Please schedule a follow-up appointment as needed .  Prescriptions: HYDROMET 5-1.5 MG/5ML SYRP (HYDROCODONE-HOMATROPINE) 1 tsp q 4 hours as needed cough  #240  x 0   Entered and Authorized by:   [name]   Signed by:   [name] on 11/29/2010   Method used:   Print then Give to Patient   RxID:   [id number] PROAIR HFA 108 (90 BASE) MCG/ACT AERS (ALBUTEROL SULFATE) 2 puffs q 4 hours as needed for sob  #1 x 2   Entered and Authorized by:   [name]   Signed by:   [name] on 11/29/2010   Method used:   Print then Give to Patient   RxID:   [id number] BIAXIN 500 MG TABS (CLARITHROMYCIN) two times a day  #20 x 0   Entered and Authorized by:   [name]   Signed by:   [name] on 11/29/2010   Method used:   Print then Give to Patient   RxID:   [id number]    Medication Administration  Injection # 1:    Medication: Depo- Medrol 40mg     Diagnosis: ACUTE SINUSITIS, UNSPECIFIED (ICD-461.9)    Route: IM    Site: RUOQ gluteus    Exp Date: 05/2013    Lot #: Gaspar Bidding    Mfr: [employer]    Patient tolerated injection without complications    Given by: Pura Spice, RN (November 29, 2010 4:20 PM)  Injection # 2:    Medication: Depo- Medrol 80mg     Diagnosis: ACUTE SINUSITIS, UNSPECIFIED (ICD-461.9)    Route: IM    Site: RUOQ gluteus    Exp Date: 05/2013    Lot #: Gaspar Bidding    Mfr: [employer]    Patient tolerated injection without complications    Given by: Pura Spice, RN (November 29, 2010 4:20 PM)  Orders Added: 1)  Est. Patient Level IV [25366] 2)  Depo- Medrol 40mg  [J1030] 3)  Depo- Medrol 80mg  [J1040] 4)  Admin of Therapeutic Inj  intramuscular or subcutaneous [[address]]

## 2010-12-23 ENCOUNTER — Other Ambulatory Visit: Payer: Self-pay | Admitting: Family Medicine

## 2010-12-23 DIAGNOSIS — K219 Gastro-esophageal reflux disease without esophagitis: Secondary | ICD-10-CM

## 2010-12-25 NOTE — Progress Notes (Signed)
Summary: new rx [facility] [place]   Phone Note From Pharmacy   Caller: [facility]  Summary of Call: new rx for labetol 200mg   transferred [facility] to [facility] Initial call taken by: Pura Spice, RN,  December 16, 2010 5:05 PM  Follow-up for Phone Call        completed  Follow-up by: Pura Spice, RN,  December 16, 2010 5:05 PM    Prescriptions: LABETALOL HCL 200 MG TABS (LABETALOL HCL) take half tab by mouth two times a day  #30 x 6   Entered by:   Pura Spice, RN   Authorized by:   [name]   Signed by:   Pura Spice, RN on 12/16/2010   Method used:   [facility]  [place] [address]       [address]       [place], [place]  [address]       Ph: [phone] or [phone]       Fax: [phone]   RxID:   [id number]

## 2011-01-27 ENCOUNTER — Other Ambulatory Visit: Payer: Self-pay | Admitting: Obstetrics and Gynecology

## 2011-02-05 LAB — CBC
HCT: 28.7 % — ABNORMAL LOW (ref 36.0–46.0)
HCT: 31.3 % — ABNORMAL LOW (ref 36.0–46.0)
Hemoglobin: 10.5 g/dL — ABNORMAL LOW (ref 12.0–15.0)
Hemoglobin: 9.8 g/dL — ABNORMAL LOW (ref 12.0–15.0)
MCHC: 33.6 g/dL (ref 30.0–36.0)
MCHC: 34.3 g/dL (ref 30.0–36.0)
MCV: 88.5 fL (ref 78.0–100.0)
MCV: 88.6 fL (ref 78.0–100.0)
Platelets: 160 10*3/uL (ref 150–400)
RBC: 3.53 MIL/uL — ABNORMAL LOW (ref 3.87–5.11)
RDW: 12.7 % (ref 11.5–15.5)
WBC: 9.4 10*3/uL (ref 4.0–10.5)

## 2011-02-05 LAB — RPR: RPR Ser Ql: NONREACTIVE

## 2011-02-06 ENCOUNTER — Telehealth: Payer: Self-pay | Admitting: Family Medicine

## 2011-02-06 NOTE — Telephone Encounter (Signed)
Pt has sore throat, cough and chest congestion. [facility] [place]. Pt going to to go to walk in clinic if med can't be called in. Walk in Clinic closes at 4:30pm and needs to know if med can be called in before then. Pls advise.

## 2011-02-07 NOTE — Telephone Encounter (Signed)
She needs an OV first. I  suppose she went to Urgent Care already

## 2011-02-07 NOTE — Telephone Encounter (Signed)
Close chart

## 2011-02-10 NOTE — Telephone Encounter (Signed)
Mess left on cell phone that she would need office visit and to call back if we could assist it with that appt.

## 2011-02-19 ENCOUNTER — Other Ambulatory Visit: Payer: Self-pay

## 2011-02-19 DIAGNOSIS — K219 Gastro-esophageal reflux disease without esophagitis: Secondary | ICD-10-CM

## 2011-02-19 MED ORDER — LABETALOL HCL 200 MG PO TABS: 200.0000 mg | ORAL_TABLET | Freq: Two times a day (BID) | ORAL | Status: DC

## 2011-02-19 MED ORDER — BUPROPION HCL ER (XL) 300 MG PO TB24: 300.0000 mg | ORAL_TABLET | Freq: Every day | ORAL | Status: DC

## 2011-02-19 MED ORDER — LANSOPRAZOLE 30 MG PO CPDR: 30.0000 mg | DELAYED_RELEASE_CAPSULE | Freq: Two times a day (BID) | ORAL | Status: DC

## 2011-02-19 MED ORDER — TARON-C DHA 53.5-38-1 MG PO CAPS: 1.0000 | ORAL_CAPSULE | Freq: Every day | ORAL | Status: DC

## 2011-02-19 NOTE — Telephone Encounter (Signed)
[facility]-- all ok'd #90 and no refills by Dr Clent Ridges

## 2011-02-20 LAB — URINALYSIS, ROUTINE W REFLEX MICROSCOPIC
Bilirubin Urine: NEGATIVE
Glucose, UA: NEGATIVE mg/dL
Hgb urine dipstick: NEGATIVE
Specific Gravity, Urine: 1.008 (ref 1.005–1.030)

## 2011-02-20 LAB — COMPREHENSIVE METABOLIC PANEL
ALT: 20 U/L (ref 0–35)
Albumin: 3.4 g/dL — ABNORMAL LOW (ref 3.5–5.2)
Alkaline Phosphatase: 78 U/L (ref 39–117)
Calcium: 8.9 mg/dL (ref 8.4–10.5)
GFR calc Af Amer: >60 mL/min (ref >60)
Glucose, Bld: 101 mg/dL — ABNORMAL HIGH (ref 70–99)
Potassium: 3.4 mEq/L — ABNORMAL LOW (ref 3.5–5.1)
Sodium: 133 mEq/L — ABNORMAL LOW (ref 135–145)
Total Protein: 7.2 g/dL (ref 6.0–8.3)

## 2011-02-20 LAB — DIFFERENTIAL
Basophils Relative: 0 % (ref 0–1)
Eosinophils Absolute: 0.5 10*3/uL (ref 0.0–0.7)
Lymphs Abs: 2.5 10*3/uL (ref 0.7–4.0)
Monocytes Absolute: 0.5 10*3/uL (ref 0.1–1.0)
Monocytes Relative: 5 % (ref 3–12)
Neutrophils Relative %: 64 % (ref 43–77)

## 2011-02-20 LAB — CBC
MCHC: 34.3 g/dL (ref 30.0–36.0)
Platelets: 225 10*3/uL (ref 150–400)
RDW: 13.2 % (ref 11.5–15.5)

## 2011-04-04 NOTE — Assessment & Plan Note (Signed)
[facility]                                   ON-CALL NOTE   Diane [name], Diane [name]                 MRN:          [id number]  DATE:07/30/2006                            DOB:          November 02, 1981    TIME:  Was 8:58 p.m.   PHONE NUMBER:  Is [phone].   OBJECTIVE:  The patient has abdominal pain on the left side laterally.  It  has been going on since last night, not sure why she did not call today.  It  goes through to the back.  She has chills.  Her thermometer is not working.  She is not eating.  Her last bowel movement was actually this afternoon.  Urination is okay with no pain and no blood.  She also just stopped  menstruating for over 1 year, is currently on Premarin, is not hungry, does  not have fever at the current time, has no other real symptoms, does not  think she has done anything to cause muscular strain or pain.   ASSESSMENT:  Left flank discomfort, suprapelvic.   PLAN:  It sounds presently like she could wait till tomorrow to be seen in  the office.  She does not have insurance and would rather not go to the  emergency room.  She can take Tylenol for pain, nothing stronger than that  as it will mask symptoms that we need to know.  If her symptoms increase or  she has fever she needs to go to the emergency room this evening.   PRIMARY CARE Zachariah Pavek:  Valetta Mole. Swords, M.D.   HOME OFFICE:  [facility].                                   Arta Silence, MD   RNS/MedQ  DD:  07/30/2006  DT:  07/31/2006  Job #:  [id number]   cc:   Valetta Mole. Swords, MD

## 2011-04-04 NOTE — H&P (Signed)
Diane [name], Diane [name]               ACCOUNT NO.:  [id number]   MEDICAL RECORD NO.:  [id number]          PATIENT TYPE:  INP   LOCATION:  5730                         FACILITY:  [facility]   PHYSICIAN:  Deanna Artis. Hickling, M.D.DATE OF BIRTH:  11-Oct-1981   DATE OF ADMISSION:  11/03/2005  DATE OF DISCHARGE:                                HISTORY & PHYSICAL   CHIEF COMPLAINT:  One week of migraines.   HISTORY OF PRESENT CONDITION:  Diane [name] is a 30 year old right-handed single  Caucasian woman with a one-week history of migraines.  She has had severe  pounding frontally predominant headaches associated with nausea and  vomiting, intolerance to light.  Patient has come to the office on one  occasion and responded modestly to dihydroxyergotamine and Phenergan.  Headaches did not resolve.  The patient was in the emergency room on  Saturday night, given Decadron two treatments as well as Phenergan which  made her headache tolerable, but did not resolve it.  She has had persistent  pain and presented to the office today.  She was treated with IV  dihydroxyergotamine and Phenergan which failed to improve her headache.  She  was then given 500 mg of IV Depacon which helped, but did not relieve her  headaches.  Nausea continues.  Decision was made to admit her to the  hospital for prolonged dihydroxyergotamine and Phenergan therapy to abolish  the headache.   PAST MEDICAL HISTORY:  In addition to migraines which began in 53 when she  was 30 years of age, she has had a mixture of migraine without aura and  tension-type headaches.  CT scan of the brain May 08, 1998 and May 2005  have been normal.  She has been responsive to IV and nasal DHE, to high-dose  prednisone, to Imitrex subcutaneous and nasal if she takes it early in her  course.  She has not responded to any oral triptans.  She has had failure of  amitriptyline and propranolol prophylaxis.  Patient has never had a closed  head injury or  nervous system infection.  She developed ulcers from  excessive use of Excedrin and also had rebound headaches.  She had endoscopy  to evaluate her gastritis.   Patient's last hospitalization was Mar 20, 2004.  She has had several trips  to the office since that time and has responded very nicely to IV DHE.  Patient has developed significant menstrual bleeding and with this a  diagnosis of Von Willebrand's disease was made.  She has been followed  initially by the [facility] at [facility] and has been referred  to [facility] for further evaluation and treatment.  DDAVP helped her  monthly menstrual cycles, but unfortunately worsened her headaches.  Every  time it was given she had a severe migraine.   Patient's hemoglobin had dropped to 10.5 as of November 2.  Plans were made  to try tranexamic acid.  If that failed factor VIII concentrate containing  Von Willebrand's factor (Humate P) was also another option.  I think that  these options are being evaluated at this  time.   PAST SURGICAL HISTORY:  None.   CURRENT MEDICATIONS:  1.  Atenolol 50 mg per day.  2.  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  3.  Levbid 0.375 mg at bedtime for gastric motility problems.  4.  Oral contraceptives.   ALLERGIES:  None.  Intolerances VICODIN (nausea) and REGLAN (jitteriness).   REVIEW OF SYSTEMS:  12-system review is negative except as noted above.   FAMILY HISTORY:  Positive for migraines in her mother and other family  members.   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  She  will transfer to [employer].  She has a stable boyfriend.  She does not use  tobacco and rarely drinks alcohol.   PHYSICAL EXAMINATION:  GENERAL:  Attractive blonde-haired, blue-eyed young  woman in severe distress.  VITAL SIGNS:  Blood pressure 118/74, resting pulse 70, respirations 18,  temperature 98.7.  HEENT:  No signs of  infection.  NECK:  Supple.  No cranial or cervical bruits.  LUNGS:  Clear to auscultation.  HEART:  No murmurs.  Pulses normal.  ABDOMEN:  Soft, nontender.  Bowel sounds normal.  EXTREMITIES:  Well formed without edema, cyanosis, alterations in tone or  tight heel cords.  NEUROLOGIC:  Mental status:  Awake, alert, attentive, appropriate, subdued  because of her pain without dysphagia or dyspraxia.  Cranial nerves:  Severe  photophobia.  Did not examine her eyes until the day after admission.  Fundi  at that time were normal with sharp disk margins.  Visual fields full to  double simultaneous stimuli.  Extraocular movements full and conjugate.  Symmetric facial strength and sensation.  Air conduction greater than bone  conduction.  She is able to protrude her tongue and elevate her uvula in  midline.  Motor examination:  Normal strength, tone, and mass.  Good fine  motor movements.  No pronator drift.  Sensation intact to cold, vibration,  stereoagnosis.  Cerebellar examination:  Good finger to nose.  Rapid  repetitive movements.  No tremor.  Dystaxia, dysmetria, gait, and station  normal.  She is able to walk on her heels, toes, perform a tandem without  difficulty.  Reflexes were symmetric, diminished.  Patient had bilateral  flexor plantar responses.   IMPRESSION:  1.  Intractable migraines (346.11).  2.  Tension type headaches (307.81).   PLAN:  Patient will be admitted for DHE-45 1 mg IV q.8h. preceded by  Phenergan 25 mg IV q.8h.  We will also give a single dose of Decadron 10 mg  IV.  Will observe her response and if she progresses well will discharge her  from home when the headaches have subsided completely.  I do not think that  she needs any further neuro diagnostic work-up.  This is a primary headache  disorder.  I appreciate the opportunity to participate in her care.      Deanna Artis. Sharene Skeans, M.D.  Electronically Signed     [facility]  D:  11/04/2005  T:   11/05/2005  Job:  [id number]

## 2011-04-04 NOTE — H&P (Signed)
[name], Diane [name]                         ACCOUNT NO.:  [id number]   MEDICAL RECORD NO.:  [id number]                   PATIENT TYPE:  INP   LOCATION:  3001                                 FACILITY:  [facility]   PHYSICIAN:  Deanna Artis. Sharene Skeans, M.D.           DATE OF BIRTH:  Jan 15, 1981   DATE OF ADMISSION:  03/20/2004  DATE OF DISCHARGE:                                HISTORY & PHYSICAL   CHIEF COMPLAINT:  Migraines since Friday (sixth day).   HISTORY OF THE PRESENT CONDITION:  Diane [name] is a 30 year old right handed,  single, Caucasian woman with a six day history of migraines.  The likely  precipitating factor is a serious illness in her maternal grandmother who is  hospitalized at [facility] with probable ovarian cancer.  The patient complains of  bifrontal radiating to the occipital region pounding pain that it is very  intense.  She has sensitive to light, sound, and movement, nausea and  vomiting.  CT scan of the brain was negative.  She was seen on two occasions  at [facility], on Friday when the episode started and again on Sunday.  She was  given narcotics and IV caffeine on both occasions, despite the fact that she  mentioned that she has responded to IV D.H.E. on other occasions.  She  called yesterday and was advised to come in today for admission to [facility] for D.H.E. protocol.  She tried Imitrex twice on Saturday with  no help.  It is not uncommon for her to have poor response to Imitrex after  her headache has been well established.  Her last serious headache was a  year and a half ago.  Her last office visit was February 08, 2002.   PAST MEDICAL HISTORY:  1. Onset of migraines, in 1990, when she was 8.  She has had a mixture  of     migraine without aura and tension type headaches.     a. CT scan of the brain, May 08, 1998, normal, also normal in May 2005.     b. The patient  has been responsive to DHE, both IV and nasal (after IV).     c. She has also been responsive to  high dose prednisone.     d. She has responded to Imitrex both subcutaneous and nasal, if she takes        it early enough in the course.     e. She has not responded to any of the oral triptans.     f. She has had failure of amitriptyline and propranolol prophylaxis.     g. She has never had a closed head injury, nervous system infection.  2. She developed ulcers from excessive use of Excedrin and had rebound     headaches.  She needed endoscopy to evaluate and treatment to heal her     stomach.   PAST SURGICAL HISTORY:  None.  CURRENT MEDICATIONS:  1. Atenolol 50 mg per day.  2. Adderall 20 XR in the morning.  3. Levbid 0.375 mg at bedtime.  4. She is also on oral contraceptives.  There may be other medicines, but she can not recall them at this time.   DRUG ALLERGIES:  None.   INTOLERANCES:  VICODIN (nausea.   REVIEW OF SYSTEMS:  No fever or sinus infection.  No arthritis, rash,  bleeding dyscrasias, diabetes, thyroid disease.  A 12 system review is  otherwise negative.   FAMILY HISTORY:  Positive for migraines.   SOCIAL HISTORY:  This is her second year at [employer].  She will be transferring  to [employer].  She has finals next week.  She is worried about her classes at  this time.  She has had a stable boyfriend for five years and there are no  problems with the relationship.  She does not use tobacco and rarely drinks  alcohol.   PHYSICAL EXAMINATION:  GENERAL:  Attractive, blond-haired, blue-eyed, right-  handed, young woman in no distress.  VITAL SIGNS:  Temperature 98.8, pulse 73, respirations 18, blood pressure  129/69, pulse oximetry 99% on room air.  EARS/NOSE/THROAT:  Supple neck.  No bruits.  No infections.  LUNGS:  Clear.  HEART:  No murmurs.  Pulses normal.  ABDOMEN:  Soft.  Bowel sounds normal.  No hepatosplenomegaly.  EXTREMITIES:  Unremarkable.  MENTAL STATUS:  Awake, alert, no dysphagia, dyspraxia.  Normal fund of  knowledge, memory, orientation.  Cranial  nerves:  Round reactive pupils.  Fundi normal.  Visual fields full to double simultaneous stimuli.  She had  photophobia to eye exam.  Symmetric facial strength.  Midline tongue and  uvula.  Air conduction greater than bone conduction bilaterally.  MOTOR EXAMINATION:  Normal strength, tone, and mass.  Good fine motor  movements.  No pronator drift.  Sensation intact to cold, vibration,  stereognosis.  CEREBELLAR EXAMINATION:  Good finger-to-nose, rapid repetitive movements.  Gait was not tested.  Deep tendon reflexes were normal to diminished.  The  patient had bilateral flexor plantar responses.   IMPRESSION:  Status migrainous.   PLAN:  D.H.E. protocol.  We will also check basic labs including CBC with  diff, sedimentation rate, and comprehensive metabolic, although I have every  confidence that this is just a straight forward persistent migraine which  has happened to [place] on numerous other occasions.   I appreciate the opportunity to see her.  I hope the D.H.E. will work within  a short, and that she can go home.                                                Deanna Artis. Sharene Skeans, M.D.    [facility]  D:  03/20/2004  T:  03/20/2004  Job:  [id number]   cc:   Valetta Mole. Swords, M.D. [facility]

## 2011-04-04 NOTE — Op Note (Signed)
NAMEIEISHA, GAO               ACCOUNT NO.:  [id number]   MEDICAL RECORD NO.:  [id number]          PATIENT TYPE:  AMB   LOCATION:  NESC                         FACILITY:  [facility]   PHYSICIAN:  Valetta Fuller, M.D.  DATE OF BIRTH:  23-Jan-1981   DATE OF PROCEDURE:  08/10/2006  DATE OF DISCHARGE:                                 OPERATIVE REPORT   PREOPERATIVE DIAGNOSIS:  1. Left hydronephrosis.  2. Distal left ureteral calculus.   POSTOPERATIVE DIAGNOSIS:  1. Left hydronephrosis.  2. No stone definitively seen.   PROCEDURE:  Cystoscopy, left retrograde pyelography, ureteroscopy, double-J  stent placement.   SURGEON:  Valetta Fuller, M.D.   ANESTHESIA:  General.   INDICATIONS:  Ms. Gillentine is a 30 year old female who had developed sudden  onset of fairly severe left sided flank pain approximately ten days ago.  The description of the discomfort seemed very typical for renal colic.  She  was noted to have microhematuria and was sent for a CT scan at [facility].  Initially that had been written by her primary care doctor for IV  contrast, but they were unable to get access, and so a stone protocol CT was  performed.  This showed what was felt to be mild left sided hydronephrosis  and hydroureter.  They did not see a definitive ureteral calculus, however,  although multiple phleboliths were noted.  The patient persisted in having  pain and presented to one of the emergency rooms.  I believe it was at  [facility] and a CT was repeated and, at this time, she was felt to have a  very small distal left ureteral calculus.  The patient subsequently came in  to see me in the [place] office.  At that time, she was doing better  clinically although continuing to have discomfort.  We felt that she,  indeed, based on the description of the symptoms, had very classic ureteral  colic.  She had had hydronephrosis noted on two CTs and the second CT did  suggest, again, a distal  ureteral calculus which certainly made sense..  We  felt given the small size of the stone that she had an excellent chance for  spontaneous passage and really encouraged her to do so.  We gave her some  Flomax samples and asked her to strain her urine and continue to try to pass  the stone spontaneously.  The patient ended up calling several times last  week complaining of ongoing pain and requested that something be done.  We  suggested to her that we wait until at least today to see if she can pass  the stone.  She tells me that she has had continued pressure on the left  side and has been straining her urine and has not noted calculus at this  point.  She continues to request intervention.  She appears to understand  the advantages and disadvantages of this approach.   TECHNIQUE AND FINDINGS:  The patient was brought to the operating room where  she had successful induction of general anesthesia.  She was  placed in the  lithotomy position, prepped and draped in the usual manner.  Cystoscopy  showed an unremarkable urethra and normal appearing bladder.  I did not  appreciate any evidence of significant trigonal edema.   Attention was first turned towards retrograde pyelogram on the left.  An 8-  [place] cone-tip catheter was used.  The ureter distally seemed to be quite  delicate.  There was a little area of narrowing about 2 cm above the  ureterovesical junction but not a definitive filling defect.  More  proximally, the ureter seemed to be of reasonably normal caliber, but the  collecting system and calices appeared to be minimally dilated.  Because of  the question of whether a stone was present or not, we went ahead and placed  a sensor wire up to the renal pelvis with fluoroscopic guidance.   Initial attempts to engage the distal ureter with the ureteroscope were  successful in that we were able to get into the most distal aspect of the  ureter but then about 2 cm above that, the  area narrowed and we did not feel  we could safely proceed.  For that reason, the ureteroscope was removed and  we used the inside portion of an access sheath to provide one-step gentle  ureteral dilation of the distal ureter which occurred without any  difficulties or problems.  The ureteroscope was then re-engaged in the  distal ureter.  One could see some areas where there was some mucosal edema  and erythema.  Certainly, this could have been secondary to the dilator but  it seemed to be located in one focal area and I suspect the patient did,  indeed, have a stone which probably has passed at some point and she gives  has residual edema and inflammation.  We went ahead ureteroscoped the entire  ureter up to just beneath the ureteropelvic junction and saw no evidence of  a stone.  The rest of the ureter was completely normal.  Again, it is  conceivable that the stone broke up partly with the dilation, it is also  conceivable that the stone did pass prior to initiation of the procedure  today.  Again, the only objective finding was some ureteral edema and  mucosal inflammation in that area.  We felt that given the need for the  dilation, it would be prudent to leave a stent in at least for a few days.  Over the guidewire, we placed a 4.8 French 24 cm stent and left the dangle  string attached to her inner thigh.  Lidocaine jelly and a B&O suppository  were used to help with any discomfort.  The patient appeared to tolerate the  procedure well.  There were no obvious complications or problems.           ______________________________  Valetta Fuller, M.D.  Electronically Signed     DSG/MEDQ  D:  08/10/2006  T:  08/11/2006  Job:  [id number]

## 2011-04-04 NOTE — Discharge Summary (Signed)
NAMEDARCELLA, SHIFFMAN               ACCOUNT NO.:  [id number]   MEDICAL RECORD NO.:  [id number]          PATIENT TYPE:  INP   LOCATION:  5730                         FACILITY:  [facility]   PHYSICIAN:  Deanna Artis. Hickling, M.D.DATE OF BIRTH:  [birth year]   DATE OF ADMISSION:  11/03/2005  DATE OF DISCHARGE:  11/06/2005                                 DISCHARGE SUMMARY   FINAL DIAGNOSIS:  1.  Intractable migraines.  2.  Von Willebrand's disease.  3.  Severe menstrual bleeding.  4.  Hypoalbuminemia.   PROCEDURES:  Nine doses of dihydroergotamine at eight hour intervals over  three days.   COMPLICATIONS:  None.   HOSPITAL COURSE:  The patient is a 30 year old woman with intermittent  migraines that have become intractable.  This time, she had a one week  history of migraine with nausea, vomiting, and inability to keep down  fluids.  The patient had received treatments both in our office and in the  emergency room  and was admitted for definitive treatment.   She responded well to the dihydroergotamine, but Phenergan was not enough to  prevent significant nausea from it, so Zofran was added to the treatment.  Whether or not Zofran could be used alone is not clear.  At the time of this  dictation, the patient has one more dose of dihydroergotamine and will be  discharged.  During the hospitalization, the patient complained of myalgias  in her legs and was treated with Flexeril which helped.  She also had a sore  throat and low grade fever which suggested a viral pharyngitis which has  improved.  During the hospitalization, the patient required insertion of a  PICC line for IV access.  She also had smoking cessation consultation placed  due to her 1/2 pack per day smoking history.  I also counseled her  concerning improving her diet, increasing the amount of protein in it given  her hypoalbuminemia.   On examination today, blood pressure 112/78, resting pulse 78, respirations  18,  temperature 98, oxygen saturation 98%.  HEENT:  No signs of infection.  Lungs clear.  Heart no murmurs.  Pulses normal.  Abdomen soft, nontender,  bowel sounds normal.  Extremities are well formed without edema, cyanosis.  Neurologic examination:  Awake, alert, appropriate, no dysphagia.  Cranial  nerves with round reactive pupils, visual fields full, extraocular movements  full, symmetric facial strength, midline tongue and uvula, air conduction  greater than bone conduction.  Motor examination with normal strength, tone,  and mass, good fine motor movements, no pronator drift.  Sensation intact to  cold, vibration.  Cerebellar examination with good finger-to-nose, rapid  alternating movements.  Gait was not tested.  Deep tendon reflexes were  diminished or absent.  The patient had bilateral flexor plantar responses.   Review of her laboratory revealed sodium 138, potassium 3.9, chloride 105,  CO2 28, glucose slightly elevated at 121, this was random level.  BUN 7,  creatinine 0.8, calcium 8.8, total protein 5.6, albumin 3.2, AST 27, ALT 14,  alkaline phos 34, total bilirubin 0.5.  CBC showed white count  elevated at  12,100, hemoglobin 11.1, hematocrit 31.5, MCV 88.4, platelet count 297,000,  74% neutrophils (total absolute granulocyte count 9000), 19% lymphs, 3%  monos, 3% eosinophils.   The patient is discharged in improved condition.  We will add Flexeril to  her other medications which include Adoral XR 20 daily, Atenolol 50 mg  daily, AcipHex 20 mg daily, MiraLax 17 grams daily, Levbid 0.375 mg b.i.d.  The Flexeril dose will be 10 mg t.i.d. as needed for cramps.  During this  hospitalization, she had blood drawn and sent to the [facility] that included one purple top and ten blue top tubes to help assist in  her further workup and treatment of her condition.      Deanna Artis. Sharene Skeans, M.D.  Electronically Signed     [facility]/MED[facility]  D:  11/06/2005  T:  11/07/2005   Job:  [id number]

## 2011-05-29 ENCOUNTER — Telehealth: Payer: Self-pay | Admitting: Family Medicine

## 2011-05-29 MED ORDER — BUPROPION HCL ER (XL) 300 MG PO TB24: 300.0000 mg | ORAL_TABLET | Freq: Every day | ORAL | Status: DC

## 2011-05-29 NOTE — Telephone Encounter (Signed)
90 day refills should come through Dr Clent Ridges. Can call in local   1 month in meantime if cant wait until Dr Clent Ridges gets back.

## 2011-05-29 NOTE — Telephone Encounter (Signed)
Pt is requesting refills thru [facility] for Wellbutrin and Labetalol. Pt last here on 02/19/11 and last filled on that same day.

## 2011-05-29 NOTE — Telephone Encounter (Signed)
I spoke with pt and gave message. Pt requested that I call in the 30 day supply of Wellbutrin and then do a mail order next week for both medication. ( called in Wellbutrin )

## 2011-06-02 ENCOUNTER — Other Ambulatory Visit: Payer: Self-pay | Admitting: Family Medicine

## 2011-06-02 MED ORDER — BUPROPION HCL ER (XL) 300 MG PO TB24: 300.0000 mg | ORAL_TABLET | Freq: Every day | ORAL | Status: DC

## 2011-06-02 MED ORDER — LABETALOL HCL 200 MG PO TABS: 200.0000 mg | ORAL_TABLET | Freq: Two times a day (BID) | ORAL | Status: DC

## 2011-06-02 NOTE — Telephone Encounter (Signed)
Addended by: Beverely Low on: 06/02/2011 12:01 PM   Modules accepted: Orders

## 2011-06-02 NOTE — Telephone Encounter (Signed)
Please call in 90 days of each with refills for one year

## 2011-06-02 NOTE — Telephone Encounter (Signed)
Addended by: Aniceto Boss A on: 06/02/2011 01:09 PM   Modules accepted: Orders

## 2011-06-02 NOTE — Telephone Encounter (Signed)
Both scripts were approved and sent to [facility] and I left a voice message with this info.

## 2011-06-28 ENCOUNTER — Other Ambulatory Visit: Payer: Self-pay | Admitting: Internal Medicine

## 2011-07-14 ENCOUNTER — Encounter: Payer: Self-pay | Admitting: Family Medicine

## 2011-07-14 ENCOUNTER — Telehealth: Payer: Self-pay | Admitting: Family Medicine

## 2011-07-14 ENCOUNTER — Ambulatory Visit (INDEPENDENT_AMBULATORY_CARE_PROVIDER_SITE_OTHER): Payer: BC Managed Care – PPO | Admitting: Family Medicine

## 2011-07-14 VITALS — BP 108/70 | HR 98 | Temp 98.6°F | Wt 178.0 lb

## 2011-07-14 DIAGNOSIS — F429 Obsessive-compulsive disorder, unspecified: Secondary | ICD-10-CM

## 2011-07-14 DIAGNOSIS — R4689 Other symptoms and signs involving appearance and behavior: Secondary | ICD-10-CM

## 2011-07-14 DIAGNOSIS — F411 Generalized anxiety disorder: Secondary | ICD-10-CM

## 2011-07-14 DIAGNOSIS — F419 Anxiety disorder, unspecified: Secondary | ICD-10-CM

## 2011-07-14 MED ORDER — LABETALOL HCL 100 MG PO TABS: 100.0000 mg | ORAL_TABLET | Freq: Two times a day (BID) | ORAL | Status: DC

## 2011-07-14 MED ORDER — BUPROPION HCL ER (XL) 150 MG PO TB24: 150.0000 mg | ORAL_TABLET | Freq: Two times a day (BID) | ORAL | Status: DC

## 2011-07-14 NOTE — Telephone Encounter (Signed)
Pharmacy needs clarification on buPROPion (WELLBUTRIN XL) 150 MG  Please contact pharmacy

## 2011-07-14 NOTE — Progress Notes (Signed)
  Subjective:    Patient ID: Diane [name], female    DOB: 05-18-1981, 30 y.o.   MRN: [id number]  HPI Here for anxiety and for some compulsive noises she is making with her mouth. She is [redacted] weeks pregnant with her second child, and she is seeing Dr. Billy Coast for this. For the past few weeks she has felt very anxious, especially at night. She has also started making a clicking noise with her tongue against the roof of her mouth. She tries to stop this but  cannot. She is still taking Wellbutrin each morning, and Dr. Billy Coast told her that this would be safe for the pregnancy. She feels better in the mornings but then gets worse at night. She is sleeping well.    Review of Systems  Constitutional: Negative.   Neurological: Negative.   Psychiatric/Behavioral: Positive for behavioral problems. The patient is nervous/anxious.        Objective:   Physical Exam  Constitutional: She is oriented to person, place, and time. She appears well-developed and well-nourished.  Neurological: She is alert and oriented to person, place, and time. No cranial nerve deficit. She exhibits normal muscle tone. Coordination normal.  Psychiatric: Judgment and thought content normal.       Anxious, good eye contact, She does "click" frequently with her mouth           Assessment & Plan:  This seems to be a compulsive behavior that is rooted in some anxiety. We will rearrange her Wellbutrin to take a 150 mg XL tablet bid to avoid drops in her blood levels. I also suggested she see a therapist to work on the anxiety and on the behaviors.

## 2011-07-15 ENCOUNTER — Telehealth: Payer: Self-pay | Admitting: Family Medicine

## 2011-07-15 NOTE — Telephone Encounter (Signed)
[facility] left message on triage line. Need clarification on Wellbutrin. Is it XL 150mg  or SR 150mg ? Please call.

## 2011-07-15 NOTE — Telephone Encounter (Signed)
[facility] called back again to ask if Wellbutrin is XL or SR. Need to know asap. Pharmacist has to call pt back before end of day today.

## 2011-07-15 NOTE — Telephone Encounter (Signed)
Spoke with pharmacy and directions are bid for the XL per [name]

## 2011-07-18 ENCOUNTER — Telehealth: Payer: Self-pay | Admitting: Family Medicine

## 2011-07-18 NOTE — Telephone Encounter (Signed)
Pt said that insurance will only cover Wellbutrin for 1 x a day, not 2 x a day. I will resend the new order to [facility].

## 2011-07-18 NOTE — Telephone Encounter (Signed)
We did get a prior authorization fax and filled out. [name] wants script for 1 po bid and form was faxed back.

## 2011-08-14 LAB — BASIC METABOLIC PANEL
Calcium: 9.2
GFR calc Af Amer: >60
GFR calc non Af Amer: >60
Glucose, Bld: 87
Sodium: 138

## 2011-08-14 LAB — CSF CELL COUNT WITH DIFFERENTIAL
RBC Count, CSF: 21 — ABNORMAL HIGH
RBC Count, CSF: 238 — ABNORMAL HIGH
Tube #: 1
WBC, CSF: 2
WBC, CSF: 6 — ABNORMAL HIGH

## 2011-08-14 LAB — CBC
Hemoglobin: 11.8 — ABNORMAL LOW
RBC: 3.82 — ABNORMAL LOW
RDW: 13.7
WBC: 9.1

## 2011-08-14 LAB — INDIA INK, CSF (CRYPTOCOCCUS) (CONVERTED LAB): India Ink:: NONE SEEN

## 2011-08-14 LAB — BLEEDING TIME: Bleeding Time: 11 — ABNORMAL HIGH

## 2011-08-14 LAB — PROTEIN AND GLUCOSE, CSF: Glucose, CSF: 55

## 2011-08-14 LAB — CSF CULTURE W GRAM STAIN

## 2011-08-14 LAB — APTT: aPTT: 29

## 2011-08-14 LAB — PROTIME-INR: Prothrombin Time: 12.4

## 2011-08-29 LAB — PREGNANCY, URINE: Preg Test, Ur: NEGATIVE

## 2011-09-02 ENCOUNTER — Encounter: Payer: Self-pay | Admitting: Nurse Practitioner

## 2011-09-02 ENCOUNTER — Ambulatory Visit (INDEPENDENT_AMBULATORY_CARE_PROVIDER_SITE_OTHER): Payer: BC Managed Care – PPO | Admitting: Nurse Practitioner

## 2011-09-02 DIAGNOSIS — F32A Depression, unspecified: Secondary | ICD-10-CM

## 2011-09-02 DIAGNOSIS — F329 Major depressive disorder, single episode, unspecified: Secondary | ICD-10-CM

## 2011-09-02 DIAGNOSIS — F411 Generalized anxiety disorder: Secondary | ICD-10-CM

## 2011-09-02 DIAGNOSIS — F419 Anxiety disorder, unspecified: Secondary | ICD-10-CM

## 2011-09-02 DIAGNOSIS — G43909 Migraine, unspecified, not intractable, without status migrainosus: Secondary | ICD-10-CM

## 2011-09-02 MED ORDER — PREDNISONE 20 MG PO TABS: 20.0000 mg | ORAL_TABLET | Freq: Two times a day (BID) | ORAL | Status: AC

## 2011-09-02 MED ORDER — VENLAFAXINE HCL ER 75 MG PO CP24: 75.0000 mg | ORAL_CAPSULE | Freq: Every day | ORAL | Status: DC

## 2011-09-02 MED ORDER — SUMATRIPTAN SUCCINATE 100 MG PO TABS: 100.0000 mg | ORAL_TABLET | Freq: Once | ORAL | Status: DC | PRN

## 2011-09-02 NOTE — Progress Notes (Signed)
Diagnosis: Migraine without aura in [redacted] week pregnant female  History: Diane [name] was diagnosed with migraine at age 30. She has been well controlled up until pregnancy on Topamax 200mg  and Maxalt. She has been [facility] [place] who gave her a prednisone taper which helped for 4 days. She has a planned pregnancy with [facility] and sees Dr Rosemary Holms. She tapered off the topamax with no difficulty and remained on Labetatol. She remained on Wellbutrin and the dose has been increased to 300mg . She has significant anxiety. Her headaches became progressively worse in second trimester. She is now taking 1-2 Fioricet multiple times per day and was given  vicoden in addition for headache. There is some suggestion from a past note that there may be some issue with narcotic overuse.    Location: bilateral temples and occipital regions  Number of Headache days/month Daily migraine since second trimester Severe:daily Moderate:daily Mild:daily  Current Outpatient Prescriptions on File Prior to Visit  Medication Sig Dispense Refill  . buPROPion (WELLBUTRIN XL) 150 MG 24 hr tablet Take 1 tablet (150 mg total) by mouth 2 (two) times daily.  180 tablet  3  . butalbital-acetaminophen-caffeine (FIORICET, ESGIC) 50-325-40 MG per tablet Take 1 tablet by mouth as needed.        . Ferrous Fumarate 150 MG TABS Take 1 each by mouth daily.        Marland Kitchen labetalol (NORMODYNE) 100 MG tablet Take 1 tablet (100 mg total) by mouth 2 (two) times daily.  180 tablet  3  . lansoprazole (PREVACID) 30 MG capsule Take 1 capsule (30 mg total) by mouth 2 (two) times daily at 10 AM and 5 PM.  90 capsule  0  . ondansetron (ZOFRAN) 8 MG tablet Take by mouth as needed.        . Prenat-FeFum-FePo-FA-Omega 3 (TARON-C DHA) 53.5-38-1 MG CAPS Take 1 capsule by mouth at bedtime.  90 capsule  0  . promethazine (PHENERGAN) 25 MG suppository Place 25 mg rectally every 4 (four) hours as needed.        Marland Kitchen albuterol  (PROAIR HFA) 108 (90 BASE) MCG/ACT inhaler Inhale 2 puffs into the lungs every 4 (four) hours as needed.        . chlorpheniramine-hydrocodone (TUSSIONEX) 8-10 MG/5ML suspension Take 5 mLs by mouth every 12 (twelve) hours as needed for Cough.  240 mL  0  . ferrous gluconate (FERGON) 325 MG tablet Take 325 mg by mouth daily with breakfast.        . predniSONE (DELTASONE) 10 MG tablet pack Take as directed for 12 days. 10 mg tablets  1 tablet  0  . topiramate (TOPAMAX) 200 MG tablet Take 200 mg by mouth at bedtime.          Acute prevention: Fioricet and Vicoden  Past Medical History  Diagnosis Date  . ADD (attention deficit disorder with hyperactivity)   . Depression   . IBS (irritable bowel syndrome)   . Headache     Dr Annia Belt at  [facility]   History reviewed. No pertinent past surgical history. Family History  Problem Relation Age of Onset  . Alcohol abuse      family hx  . Arthritis      family hx  . Melanoma      family hx  . Aneurysm      family hx  . Brain cancer Father     TUMOR   Social History:  reports that she quit smoking about 3 years ago. She has never used smokeless tobacco. She reports that she does not drink alcohol or use illicit drugs.  She works one day per week out of home in [employer]. She has a 24 month old son who is very active. She does have good support from husband and mother Allergies: compazine and reglan Allergies  Allergen Reactions  . Metoclopramide Hcl   . Prochlorperazine Edisylate     Triggers: Stress  Birth control:pregnant now, will consider paraguard iud after delivery  ROS: positive for headache, pregnancy, anemia, anxiety, depression, GERD, endometrosis  Exam: General alert, oriented, pregnant Cauc female in NAD HEENT negative Neuro negative Cardiac RRR Lungs: Clear FHT 138 Skin warm and dry  Impression:migraine - common. Chronic migraine in Pregnancy. Anxiety, depression. Narcotic overuse  Plan We had a very  lengthy discussion today weighing the risk and benefit ratio of medications and their safety in pregnancy. We will use a predinsone taper today to help get her off the narcotics. She is asked to stop the evening dose of Wellbutrin and start Effexor 75mg  in am. Our goal is to discontinue the Wellbutrin completely as it is not an effective headache prevention and maybe making her more anxious. She will be given a prescription for Imitrex to use when she has severe migraine. She should use narcotics for rescue only. She may in fact be rebounding for high doses of fioricet.  We also discussed at length non medication management of anxiety including walking for one hour daily and she will be referred to [facility] for massage, stress management, biofeedback. She will return in one week and call if she needs anything   Time Spent:over one hour

## 2011-09-02 NOTE — Progress Notes (Signed)
New headache consult.

## 2011-09-06 ENCOUNTER — Other Ambulatory Visit: Payer: Self-pay | Admitting: Family Medicine

## 2011-09-08 NOTE — Telephone Encounter (Signed)
There are still refills left.

## 2011-09-09 ENCOUNTER — Ambulatory Visit (INDEPENDENT_AMBULATORY_CARE_PROVIDER_SITE_OTHER): Payer: BC Managed Care – PPO | Admitting: Nurse Practitioner

## 2011-09-09 ENCOUNTER — Encounter: Payer: Self-pay | Admitting: Nurse Practitioner

## 2011-09-09 DIAGNOSIS — F419 Anxiety disorder, unspecified: Secondary | ICD-10-CM

## 2011-09-09 DIAGNOSIS — G43909 Migraine, unspecified, not intractable, without status migrainosus: Secondary | ICD-10-CM

## 2011-09-09 DIAGNOSIS — F411 Generalized anxiety disorder: Secondary | ICD-10-CM

## 2011-09-09 MED ORDER — VENLAFAXINE HCL ER 150 MG PO CP24: 150.0000 mg | ORAL_CAPSULE | Freq: Every day | ORAL | Status: DC

## 2011-09-09 MED ORDER — SUMATRIPTAN SUCCINATE 100 MG PO TABS: 100.0000 mg | ORAL_TABLET | Freq: Once | ORAL | Status: DC | PRN

## 2011-09-09 NOTE — Progress Notes (Signed)
S: Pt returns to office today for follow up on migraine headaches. She has had 2-3 headaches since our last visit. Today is her last dose of prednisone. She has decreased the dose of wellbutrin to half and started effexor 75mg . Other than slight nausea she has done well with effexor. She has only taken fiorcet once yesterday. She had been taking up to 6 per day. She takes 1/2 tab of imitrex when she gets a migraine. She has met with Dr Rosemary Holms and explained care plan to him and he has agreed with treatments. She has an appointment with [facility] Thursday and they will evaluate and treat anxiety and headaches. She has been walking daily  O: Alert, oriented, NAD. Seems to be feeling much better today  A: Migraine and anxiety in pregnancy  P: She is really doing better with narcotic overuse. She will stop Wellbutrin and go up on Effexor to 150mg . Continue walking and non medication management of anxiety. She can call if any problems arise otherwise we will see her back in one month

## 2011-10-01 ENCOUNTER — Telehealth: Payer: Self-pay

## 2011-10-01 DIAGNOSIS — F419 Anxiety disorder, unspecified: Secondary | ICD-10-CM

## 2011-10-01 MED ORDER — VENLAFAXINE HCL ER 75 MG PO CP24: 225.0000 mg | ORAL_CAPSULE | Freq: Every day | ORAL | Status: DC

## 2011-10-01 NOTE — Telephone Encounter (Signed)
PATIENT WAS SUPPOSE TO COME IN TO DISCUSS INCREASING HER DOSE ON THE EFFEXOR 150MG , 300. [facility] OF [facility]

## 2011-10-01 NOTE — Telephone Encounter (Signed)
Medication was called in as directed.

## 2011-10-01 NOTE — Telephone Encounter (Signed)
PLEASE CALL PATIENT EFFXOR XR 75MG , TAKE 3 TAB IN THE AM #90 WITH 3 REFILLS. PLEASE SEND IT TO HER PHARMACY IT SHOULD BE IN THE SYSTEM PER LINDA BAREFOOT, THANKS!

## 2011-10-01 NOTE — Telephone Encounter (Signed)
Addended by: Barbara Cower on: 10/01/2011 05:38 PM   Modules accepted: Orders

## 2011-10-07 ENCOUNTER — Encounter: Payer: BC Managed Care – PPO | Admitting: Nurse Practitioner

## 2011-10-21 ENCOUNTER — Telehealth: Payer: Self-pay

## 2011-10-21 NOTE — Telephone Encounter (Signed)
Patient spoke with someone on Monday about her Effexor, pharmacy is wanting pre-authorization and will not fill it till someone calls. She only enough for Wednesday. Please call her pharmacy to pre-authorize. Jannifer Rodney prescribed it.

## 2011-11-08 ENCOUNTER — Encounter (HOSPITAL_COMMUNITY): Payer: Self-pay | Admitting: *Deleted

## 2011-11-08 ENCOUNTER — Inpatient Hospital Stay (HOSPITAL_COMMUNITY)
Admission: AD | Admit: 2011-11-08 | Discharge: 2011-11-08 | Disposition: A | Discharge status: Home / Self Care | Payer: BC Managed Care – PPO | Source: Ambulatory Visit | Attending: Obstetrics & Gynecology | Admitting: Obstetrics & Gynecology

## 2011-11-08 DIAGNOSIS — O4700 False labor before 37 completed weeks of gestation, unspecified trimester: Secondary | ICD-10-CM | POA: Insufficient documentation

## 2011-11-08 HISTORY — DX: Other specified postprocedural states: Z98.890

## 2011-11-08 HISTORY — DX: Nausea with vomiting, unspecified: R11.2

## 2011-11-08 NOTE — H&P (Signed)
[name], Diane [name]               ACCOUNT NO.:  [id number]  MEDICAL RECORD NO.:  [id number]  LOCATION:  JX91                          FACILITY:  [facility]  PHYSICIAN:  Lenoard Aden, M.D.DATE OF BIRTH:  11-24-1980  DATE OF ADMISSION:  11/08/2011 DATE OF DISCHARGE:                             HISTORY & PHYSICAL   CHIEF COMPLAINT:  Premature contractions.  HISTORY OF PRESENT ILLNESS:  She is a 30 year old white female, G2, P1, at 33-2/7th weeks' gestation who presents with increased frequency of contractions today.  She denies bleeding or leakage of fluid.  ALLERGIES:  She has allergies to ADHESIVE TAPE, REGLAN, and COMPAZINE.  MEDICATIONS:  Fioricet as needed, Tussionex as needed for cough, Zofran as needed, Valtrex as needed, iron and prenatal vitamins, labetalol, Effexor, Prevacid, Phenergan as needed, and magnesium.  Her prenatal course today has been uncomplicated.  Her family history is remarkable for COPD, breast cancer, rheumatoid arthritis, melanoma, heart disease, migraines, ovarian cancer, drug abuse, prostate cancer, anemia, chronic hypertension, diabetes, and depression.  PHYSICAL EXAMINATION:  GENERAL:  This is a well-developed, well- nourished, white female, in no acute distress. VITAL SIGNS:  Stable.  Patient is afebrile. HEENT:  Normal. NECK:  Supple.  Full range of motion. LUNGS:  Clear. HEART:  Regular rhythm. ABDOMEN:  Soft, gravid, nontender.  No CVA tenderness. EXTREMITIES:  No cords. NEUROLOGIC:  Nonfocal. SKIN:  Intact.  IMPRESSION:  A 33-2/7th week intrauterine pregnancy with no evidence of preterm cervical change.  Fetal fibronectin and urine culture both negative on November 05, 2011.  PLAN:  We will monitor tocolytics as needed.  Likely discharge home.     Lenoard Aden, M.D.     RJT/MEDQ  D:  11/08/2011  T:  11/08/2011  Job:  [id number]

## 2011-11-08 NOTE — Progress Notes (Signed)
Pt reports having contractions  Since Tuesday night 9 min apart. Today they are closer together 4-6 min apart and feel stronger.

## 2011-11-08 NOTE — ED Notes (Signed)
Dr. Billy Coast in with pt

## 2011-11-08 NOTE — ED Provider Notes (Signed)
History   G2P1 with premature contractions. No bleeding or LOF. Neg FFN and neg Urine CS on 12/19. Chief Complaint  Patient presents with  . Contractions   HPI Per above.  OB History    Grav Para Term Preterm Abortions TAB SAB Ect Mult Living   2 1 1  0 0 0 0 0 0 1      Past Medical History  Diagnosis Date  . ADD (attention deficit disorder with hyperactivity)   . Depression   . IBS (irritable bowel syndrome)   . Headache     Dr Annia Belt at  [facility]  . PONV (postoperative nausea and vomiting)     Past Surgical History  Procedure Date  . Cesarean section   . Extraction of wisdom teeth    . Tonsillectomy     Family History  Problem Relation Age of Onset  . Alcohol abuse      family hx  . Arthritis      family hx  . Melanoma      family hx  . Aneurysm      family hx  . Brain cancer Father     TUMOR  . Anesthesia problems Father   . Anesthesia problems Mother     History  Substance Use Topics  . Smoking status: Former Smoker    Quit date: 12/19/2007  . Smokeless tobacco: Never Used  . Alcohol Use: No    Allergies:  Allergies  Allergen Reactions  . Metoclopramide Hcl   . Prochlorperazine Edisylate     Prescriptions prior to admission  Medication Sig Dispense Refill  . albuterol (PROAIR HFA) 108 (90 BASE) MCG/ACT inhaler Inhale 2 puffs into the lungs every 4 (four) hours as needed.        Marland Kitchen buPROPion (WELLBUTRIN XL) 150 MG 24 hr tablet Take 1 tablet (150 mg total) by mouth 2 (two) times daily.  180 tablet  3  . butalbital-acetaminophen-caffeine (FIORICET, ESGIC) 50-325-40 MG per tablet Take 1 tablet by mouth as needed.        . chlorpheniramine-hydrocodone (TUSSIONEX) 8-10 MG/5ML suspension Take 5 mLs by mouth every 12 (twelve) hours as needed for Cough.  240 mL  0  . Ferrous Fumarate 150 MG TABS Take 1 each by mouth daily.        . ferrous gluconate (FERGON) 325 MG tablet Take 325 mg by mouth daily with breakfast.        . labetalol  (NORMODYNE) 100 MG tablet Take 1 tablet (100 mg total) by mouth 2 (two) times daily.  180 tablet  3  . lansoprazole (PREVACID) 30 MG capsule Take 1 capsule (30 mg total) by mouth 2 (two) times daily at 10 AM and 5 PM.  90 capsule  0  . ondansetron (ZOFRAN) 8 MG tablet Take by mouth as needed.        . predniSONE (DELTASONE) 10 MG tablet pack Take as directed for 12 days. 10 mg tablets  1 tablet  0  . Prenat-FeFum-FePo-FA-Omega 3 (TARON-C DHA) 53.5-38-1 MG CAPS Take 1 capsule by mouth at bedtime.  90 capsule  0  . promethazine (PHENERGAN) 25 MG suppository Place 25 mg rectally every 4 (four) hours as needed.        . SUMAtriptan (IMITREX) 100 MG tablet Take 1 tablet (100 mg total) by mouth once as needed for migraine.  27 tablet  1  . topiramate (TOPAMAX) 200 MG tablet Take 200 mg by mouth at bedtime.       Marland Kitchen  venlafaxine (EFFEXOR-XR) 75 MG 24 hr capsule Take 3 capsules (225 mg total) by mouth daily.  90 capsule  6    ROS: otherwise negative Physical Exam Dictated   Blood pressure 116/79, pulse 92, temperature 98.6 F (37 C), temperature source Oral, resp. rate 18, height 5\' 7"  (1.702 m), weight 97.796 kg (215 lb 9.6 oz).  Physical Exam dictated.  MAU Course  Procedures NST  MDM na  Assessment and Plan  Premature contractions- no cervical change DC home  Camden Mazzaferro J 11/08/2011, 5:31 PM

## 2011-11-20 ENCOUNTER — Encounter (HOSPITAL_COMMUNITY): Payer: Self-pay | Admitting: Anesthesiology

## 2011-11-20 ENCOUNTER — Inpatient Hospital Stay (HOSPITAL_COMMUNITY): Payer: BC Managed Care – PPO

## 2011-11-20 ENCOUNTER — Inpatient Hospital Stay (HOSPITAL_COMMUNITY)
Admission: AD | Admit: 2011-11-20 | Discharge: 2011-11-23 | DRG: 650 | Disposition: A | Discharge status: Home / Self Care | Payer: BC Managed Care – PPO | Source: Ambulatory Visit | Attending: Obstetrics and Gynecology | Admitting: Obstetrics and Gynecology

## 2011-11-20 ENCOUNTER — Inpatient Hospital Stay (HOSPITAL_COMMUNITY): Payer: BC Managed Care – PPO | Admitting: Anesthesiology

## 2011-11-20 ENCOUNTER — Encounter (HOSPITAL_COMMUNITY): Payer: Self-pay | Admitting: *Deleted

## 2011-11-20 ENCOUNTER — Encounter (HOSPITAL_COMMUNITY)
Admission: AD | Disposition: A | Discharge status: Home / Self Care | Payer: Self-pay | Source: Ambulatory Visit | Attending: Obstetrics and Gynecology

## 2011-11-20 ENCOUNTER — Encounter (HOSPITAL_COMMUNITY): Payer: Self-pay | Admitting: Obstetrics and Gynecology

## 2011-11-20 ENCOUNTER — Other Ambulatory Visit: Payer: Self-pay | Admitting: Obstetrics and Gynecology

## 2011-11-20 DIAGNOSIS — F419 Anxiety disorder, unspecified: Secondary | ICD-10-CM

## 2011-11-20 DIAGNOSIS — O9903 Anemia complicating the puerperium: Secondary | ICD-10-CM | POA: Diagnosis not present

## 2011-11-20 DIAGNOSIS — J40 Bronchitis, not specified as acute or chronic: Secondary | ICD-10-CM

## 2011-11-20 DIAGNOSIS — O9902 Anemia complicating childbirth: Secondary | ICD-10-CM

## 2011-11-20 DIAGNOSIS — O4590 Premature separation of placenta, unspecified, unspecified trimester: Principal | ICD-10-CM | POA: Diagnosis present

## 2011-11-20 DIAGNOSIS — O1092 Unspecified pre-existing hypertension complicating childbirth: Secondary | ICD-10-CM | POA: Diagnosis present

## 2011-11-20 DIAGNOSIS — G932 Benign intracranial hypertension: Secondary | ICD-10-CM | POA: Diagnosis present

## 2011-11-20 DIAGNOSIS — O34219 Maternal care for unspecified type scar from previous cesarean delivery: Secondary | ICD-10-CM | POA: Diagnosis present

## 2011-11-20 DIAGNOSIS — D649 Anemia, unspecified: Secondary | ICD-10-CM | POA: Diagnosis not present

## 2011-11-20 DIAGNOSIS — O10919 Unspecified pre-existing hypertension complicating pregnancy, unspecified trimester: Secondary | ICD-10-CM | POA: Diagnosis present

## 2011-11-20 LAB — COMPREHENSIVE METABOLIC PANEL
AST: 14 U/L (ref 0–37)
Albumin: 2.7 g/dL — ABNORMAL LOW (ref 3.5–5.2)
Alkaline Phosphatase: 103 U/L (ref 39–117)
Chloride: 105 mEq/L (ref 96–112)
Potassium: 3.7 mEq/L (ref 3.5–5.1)
Sodium: 133 mEq/L — ABNORMAL LOW (ref 135–145)
Total Bilirubin: 0.1 mg/dL — ABNORMAL LOW (ref 0.3–1.2)
Total Protein: 5.9 g/dL — ABNORMAL LOW (ref 6.0–8.3)

## 2011-11-20 LAB — CBC
HCT: 31.3 % — ABNORMAL LOW (ref 36.0–46.0)
Hemoglobin: 10.6 g/dL — ABNORMAL LOW (ref 12.0–15.0)
RBC: 3.46 MIL/uL — ABNORMAL LOW (ref 3.87–5.11)
WBC: 12.2 10*3/uL — ABNORMAL HIGH (ref 4.0–10.5)

## 2011-11-20 LAB — TYPE AND SCREEN
ABO/RH(D): A POS
Antibody Screen: NEGATIVE

## 2011-11-20 LAB — DIC (DISSEMINATED INTRAVASCULAR COAGULATION)PANEL
Smear Review: NONE SEEN
aPTT: 28 seconds (ref 24–37)

## 2011-11-20 LAB — ABO/RH: ABO/RH(D): A POS

## 2011-11-20 SURGERY — Surgical Case
Anesthesia: Spinal | Site: Abdomen | Wound class: Clean Contaminated

## 2011-11-20 MED ORDER — SCOPOLAMINE 1 MG/3DAYS TD PT72
MEDICATED_PATCH | TRANSDERMAL | Status: AC
  Filled 2011-11-20: qty 1

## 2011-11-20 MED ORDER — ONDANSETRON HCL 4 MG/2ML IJ SOLN
INTRAMUSCULAR | Status: AC
  Administered 2011-11-20: 4 mg via INTRAVENOUS
  Filled 2011-11-20: qty 2

## 2011-11-20 MED ORDER — NALBUPHINE HCL 10 MG/ML IJ SOLN
5.0000 mg | INTRAMUSCULAR | Status: DC | PRN
  Filled 2011-11-20: qty 1

## 2011-11-20 MED ORDER — PHENYLEPHRINE HCL 10 MG/ML IJ SOLN
INTRAMUSCULAR | Status: DC | PRN
  Administered 2011-11-20: 40 ug via INTRAVENOUS
  Administered 2011-11-20: 120 ug via INTRAVENOUS

## 2011-11-20 MED ORDER — FAMOTIDINE IN NACL 20-0.9 MG/50ML-% IV SOLN
INTRAVENOUS | Status: AC
  Administered 2011-11-20: 20 mg
  Filled 2011-11-20: qty 50

## 2011-11-20 MED ORDER — IBUPROFEN 600 MG PO TABS: 600.0000 mg | ORAL_TABLET | Freq: Four times a day (QID) | ORAL | Status: DC

## 2011-11-20 MED ORDER — IBUPROFEN 600 MG PO TABS: 600.0000 mg | ORAL_TABLET | Freq: Four times a day (QID) | ORAL | Status: DC | PRN

## 2011-11-20 MED ORDER — SIMETHICONE 80 MG PO CHEW
80.0000 mg | CHEWABLE_TABLET | Freq: Three times a day (TID) | ORAL | Status: DC
  Administered 2011-11-21 – 2011-11-23 (×8): 80 mg via ORAL

## 2011-11-20 MED ORDER — CEFAZOLIN SODIUM 1-5 GM-% IV SOLN
INTRAVENOUS | Status: AC
  Administered 2011-11-20: 1000 mg
  Filled 2011-11-20: qty 50

## 2011-11-20 MED ORDER — OXYTOCIN 20 UNITS IN LACTATED RINGERS INFUSION - SIMPLE
INTRAVENOUS | Status: DC | PRN
  Administered 2011-11-20: 20 [IU] via INTRAVENOUS

## 2011-11-20 MED ORDER — FAMOTIDINE IN NACL 20-0.9 MG/50ML-% IV SOLN
INTRAVENOUS | Status: AC
  Filled 2011-11-20: qty 50

## 2011-11-20 MED ORDER — TETANUS-DIPHTH-ACELL PERTUSSIS 5-2.5-18.5 LF-MCG/0.5 IM SUSP
0.5000 mL | Freq: Once | INTRAMUSCULAR | Status: AC
  Administered 2011-11-21: 0.5 mL via INTRAMUSCULAR
  Filled 2011-11-20: qty 0.5

## 2011-11-20 MED ORDER — DIPHENHYDRAMINE HCL 50 MG/ML IJ SOLN: 25.0000 mg | INTRAMUSCULAR | Status: DC | PRN

## 2011-11-20 MED ORDER — HYDROMORPHONE HCL PF 1 MG/ML IJ SOLN
INTRAMUSCULAR | Status: AC
  Filled 2011-11-20: qty 1

## 2011-11-20 MED ORDER — OXYTOCIN 20 UNITS IN LACTATED RINGERS INFUSION - SIMPLE
INTRAVENOUS | Status: AC
  Filled 2011-11-20: qty 1000

## 2011-11-20 MED ORDER — LACTATED RINGERS IV SOLN
INTRAVENOUS | Status: DC
  Administered 2011-11-21: 04:00:00 via INTRAVENOUS

## 2011-11-20 MED ORDER — SODIUM CHLORIDE 0.9 % IJ SOLN
INTRAMUSCULAR | Status: AC
  Filled 2011-11-20: qty 3

## 2011-11-20 MED ORDER — SODIUM CHLORIDE 0.9 % IJ SOLN: 3.0000 mL | INTRAMUSCULAR | Status: DC | PRN

## 2011-11-20 MED ORDER — PRENATAL MULTIVITAMIN CH
1.0000 | ORAL_TABLET | Freq: Every day | ORAL | Status: DC
  Administered 2011-11-21 – 2011-11-23 (×3): 1 via ORAL
  Filled 2011-11-20 (×3): qty 1

## 2011-11-20 MED ORDER — KETOROLAC TROMETHAMINE 30 MG/ML IJ SOLN: 30.0000 mg | Freq: Four times a day (QID) | INTRAMUSCULAR | Status: DC | PRN

## 2011-11-20 MED ORDER — ONDANSETRON HCL 4 MG/2ML IJ SOLN
INTRAMUSCULAR | Status: DC | PRN
  Administered 2011-11-20: 4 mg via INTRAVENOUS

## 2011-11-20 MED ORDER — LACTATED RINGERS IV SOLN: INTRAVENOUS | Status: DC

## 2011-11-20 MED ORDER — SCOPOLAMINE 1 MG/3DAYS TD PT72
1.0000 | MEDICATED_PATCH | Freq: Once | TRANSDERMAL | Status: DC
  Administered 2011-11-20: 1.5 mg via TRANSDERMAL

## 2011-11-20 MED ORDER — SENNOSIDES-DOCUSATE SODIUM 8.6-50 MG PO TABS
2.0000 | ORAL_TABLET | Freq: Every day | ORAL | Status: DC
  Administered 2011-11-21 – 2011-11-22 (×2): 2 via ORAL

## 2011-11-20 MED ORDER — KETOROLAC TROMETHAMINE 60 MG/2ML IM SOLN
60.0000 mg | Freq: Once | INTRAMUSCULAR | Status: AC | PRN
  Administered 2011-11-20: 60 mg via INTRAMUSCULAR

## 2011-11-20 MED ORDER — FENTANYL CITRATE 0.05 MG/ML IJ SOLN
INTRAMUSCULAR | Status: DC | PRN
  Administered 2011-11-20: 75 ug via INTRAVENOUS

## 2011-11-20 MED ORDER — HYDROMORPHONE HCL PF 1 MG/ML IJ SOLN
0.2500 mg | INTRAMUSCULAR | Status: DC | PRN
  Administered 2011-11-20: 0.5 mg via INTRAVENOUS

## 2011-11-20 MED ORDER — NALOXONE HCL 0.4 MG/ML IJ SOLN: 0.4000 mg | INTRAMUSCULAR | Status: DC | PRN

## 2011-11-20 MED ORDER — MENTHOL 3 MG MT LOZG: 1.0000 | LOZENGE | OROMUCOSAL | Status: DC | PRN

## 2011-11-20 MED ORDER — LACTATED RINGERS IV SOLN
INTRAVENOUS | Status: DC | PRN
  Administered 2011-11-20: 20:00:00 via INTRAVENOUS

## 2011-11-20 MED ORDER — ONDANSETRON HCL 4 MG PO TABS: 4.0000 mg | ORAL_TABLET | ORAL | Status: DC | PRN

## 2011-11-20 MED ORDER — DIPHENHYDRAMINE HCL 50 MG/ML IJ SOLN
12.5000 mg | INTRAMUSCULAR | Status: DC | PRN
  Administered 2011-11-20 – 2011-11-21 (×2): 12.5 mg via INTRAVENOUS
  Filled 2011-11-20: qty 1

## 2011-11-20 MED ORDER — CEFAZOLIN SODIUM 1-5 GM-% IV SOLN: 1.0000 g | INTRAVENOUS | Status: DC

## 2011-11-20 MED ORDER — SODIUM CHLORIDE 0.9 % IV SOLN
1.0000 ug/kg/h | INTRAVENOUS | Status: DC | PRN
  Filled 2011-11-20: qty 2.5

## 2011-11-20 MED ORDER — DIPHENHYDRAMINE HCL 25 MG PO CAPS
25.0000 mg | ORAL_CAPSULE | ORAL | Status: DC | PRN
  Filled 2011-11-20: qty 1

## 2011-11-20 MED ORDER — DIBUCAINE 1 % RE OINT: 1.0000 "application " | TOPICAL_OINTMENT | RECTAL | Status: DC | PRN

## 2011-11-20 MED ORDER — METOCLOPRAMIDE HCL 5 MG/ML IJ SOLN: 10.0000 mg | Freq: Three times a day (TID) | INTRAMUSCULAR | Status: DC | PRN

## 2011-11-20 MED ORDER — HYDROMORPHONE HCL PF 1 MG/ML IJ SOLN
0.2500 mg | INTRAMUSCULAR | Status: DC | PRN
  Administered 2011-11-20 (×7): 0.5 mg via INTRAVENOUS

## 2011-11-20 MED ORDER — ONDANSETRON HCL 4 MG/2ML IJ SOLN: 4.0000 mg | INTRAMUSCULAR | Status: DC | PRN

## 2011-11-20 MED ORDER — BUPIVACAINE HCL (PF) 0.25 % IJ SOLN
INTRAMUSCULAR | Status: DC | PRN
  Administered 2011-11-20: 30 mL

## 2011-11-20 MED ORDER — OXYCODONE-ACETAMINOPHEN 5-325 MG PO TABS
1.0000 | ORAL_TABLET | ORAL | Status: DC | PRN
  Administered 2011-11-21 – 2011-11-23 (×13): 2 via ORAL
  Filled 2011-11-20 (×13): qty 2

## 2011-11-20 MED ORDER — KETOROLAC TROMETHAMINE 60 MG/2ML IM SOLN
INTRAMUSCULAR | Status: AC
  Administered 2011-11-20: 60 mg via INTRAMUSCULAR
  Filled 2011-11-20: qty 2

## 2011-11-20 MED ORDER — METOCLOPRAMIDE HCL 5 MG/ML IJ SOLN
INTRAMUSCULAR | Status: AC
  Filled 2011-11-20: qty 2

## 2011-11-20 MED ORDER — MORPHINE SULFATE (PF) 0.5 MG/ML IJ SOLN
INTRAMUSCULAR | Status: DC | PRN
  Administered 2011-11-20: 4.85 mg via INTRAVENOUS

## 2011-11-20 MED ORDER — OXYTOCIN 20 UNITS IN LACTATED RINGERS INFUSION - SIMPLE
125.0000 mL/h | INTRAVENOUS | Status: DC
  Administered 2011-11-20 – 2011-11-21 (×2): 125 mL/h via INTRAVENOUS
  Filled 2011-11-20: qty 1000

## 2011-11-20 MED ORDER — VENLAFAXINE HCL ER 75 MG PO CP24
225.0000 mg | ORAL_CAPSULE | Freq: Every day | ORAL | Status: DC
  Administered 2011-11-21 – 2011-11-23 (×3): 225 mg via ORAL
  Filled 2011-11-20 (×3): qty 3

## 2011-11-20 MED ORDER — MEPERIDINE HCL 25 MG/ML IJ SOLN: 6.2500 mg | INTRAMUSCULAR | Status: DC | PRN

## 2011-11-20 MED ORDER — DEXAMETHASONE SODIUM PHOSPHATE 10 MG/ML IJ SOLN
INTRAMUSCULAR | Status: DC | PRN
  Administered 2011-11-20: 10 mg via INTRAVENOUS

## 2011-11-20 MED ORDER — ONDANSETRON HCL 4 MG/2ML IJ SOLN
4.0000 mg | Freq: Three times a day (TID) | INTRAMUSCULAR | Status: DC | PRN
  Administered 2011-11-20: 4 mg via INTRAVENOUS

## 2011-11-20 MED ORDER — LABETALOL HCL 100 MG PO TABS
100.0000 mg | ORAL_TABLET | Freq: Two times a day (BID) | ORAL | Status: DC
  Administered 2011-11-21 – 2011-11-23 (×6): 100 mg via ORAL
  Filled 2011-11-20 (×6): qty 1

## 2011-11-20 MED ORDER — FENTANYL CITRATE 0.05 MG/ML IJ SOLN
INTRAMUSCULAR | Status: DC | PRN
  Administered 2011-11-20: 25 ug via INTRATHECAL

## 2011-11-20 MED ORDER — ZOLPIDEM TARTRATE 5 MG PO TABS: 5.0000 mg | ORAL_TABLET | Freq: Every evening | ORAL | Status: DC | PRN

## 2011-11-20 MED ORDER — DIPHENHYDRAMINE HCL 25 MG PO CAPS: 25.0000 mg | ORAL_CAPSULE | Freq: Four times a day (QID) | ORAL | Status: DC | PRN

## 2011-11-20 MED ORDER — DIPHENHYDRAMINE HCL 50 MG/ML IJ SOLN
INTRAMUSCULAR | Status: AC
  Filled 2011-11-20: qty 1

## 2011-11-20 MED ORDER — WITCH HAZEL-GLYCERIN EX PADS: 1.0000 "application " | MEDICATED_PAD | CUTANEOUS | Status: DC | PRN

## 2011-11-20 MED ORDER — SIMETHICONE 80 MG PO CHEW: 80.0000 mg | CHEWABLE_TABLET | ORAL | Status: DC | PRN

## 2011-11-20 MED ORDER — LANOLIN HYDROUS EX OINT: 1.0000 "application " | TOPICAL_OINTMENT | CUTANEOUS | Status: DC | PRN

## 2011-11-20 MED ORDER — NALBUPHINE HCL 10 MG/ML IJ SOLN
5.0000 mg | INTRAMUSCULAR | Status: DC | PRN
  Administered 2011-11-21: 10 mg via INTRAVENOUS
  Filled 2011-11-20 (×2): qty 1

## 2011-11-20 MED ORDER — CITRIC ACID-SODIUM CITRATE 334-500 MG/5ML PO SOLN
ORAL | Status: AC
  Administered 2011-11-20: 30 mL
  Filled 2011-11-20: qty 15

## 2011-11-20 MED ORDER — LACTATED RINGERS IV SOLN
INTRAVENOUS | Status: DC | PRN
  Administered 2011-11-20 (×2): via INTRAVENOUS

## 2011-11-20 MED ORDER — CITRIC ACID-SODIUM CITRATE 334-500 MG/5ML PO SOLN
ORAL | Status: AC
  Filled 2011-11-20: qty 15

## 2011-11-20 MED ORDER — MORPHINE SULFATE (PF) 0.5 MG/ML IJ SOLN
INTRAMUSCULAR | Status: DC | PRN
  Administered 2011-11-20: .15 mg via INTRATHECAL

## 2011-11-20 SURGICAL SUPPLY — 30 items
CLOTH BEACON ORANGE TIMEOUT ST (SAFETY) ×2 IMPLANT
CONTAINER PREFILL 10% NBF 15ML (MISCELLANEOUS) IMPLANT
DRESSING TELFA 8X3 (GAUZE/BANDAGES/DRESSINGS) ×1 IMPLANT
ELECT REM PT RETURN 9FT ADLT (ELECTROSURGICAL) ×2
ELECTRODE REM PT RTRN 9FT ADLT (ELECTROSURGICAL) ×1 IMPLANT
EXTRACTOR VACUUM M CUP 4 TUBE (SUCTIONS) IMPLANT
GAUZE SPONGE 4X4 12PLY STRL LF (GAUZE/BANDAGES/DRESSINGS) ×4 IMPLANT
GLOVE BIO SURGEON STRL SZ7.5 (GLOVE) ×4 IMPLANT
GOWN PREVENTION PLUS LG XLONG (DISPOSABLE) ×4 IMPLANT
GOWN PREVENTION PLUS XLARGE (GOWN DISPOSABLE) ×2 IMPLANT
KIT ABG SYR 3ML LUER SLIP (SYRINGE) ×1 IMPLANT
NDL HYPO 25X1 1.5 SAFETY (NEEDLE) ×1 IMPLANT
NDL HYPO 25X5/8 SAFETYGLIDE (NEEDLE) IMPLANT
NEEDLE HYPO 25X1 1.5 SAFETY (NEEDLE) ×2 IMPLANT
NEEDLE HYPO 25X5/8 SAFETYGLIDE (NEEDLE) ×2 IMPLANT
NS IRRIG 1000ML POUR BTL (IV SOLUTION) ×2 IMPLANT
PACK C SECTION WH (CUSTOM PROCEDURE TRAY) ×2 IMPLANT
PAD ABD 7.5X8 STRL (GAUZE/BANDAGES/DRESSINGS) ×1 IMPLANT
SLEEVE SCD COMPRESS KNEE MED (MISCELLANEOUS) ×2 IMPLANT
STAPLER VISISTAT 35W (STAPLE) ×2 IMPLANT
SUT MNCRL 0 VIOLET CTX 36 (SUTURE) ×2 IMPLANT
SUT MON AB 2-0 CT1 27 (SUTURE) ×2 IMPLANT
SUT MON AB-0 CT1 36 (SUTURE) ×4 IMPLANT
SUT MONOCRYL 0 CTX 36 (SUTURE) ×3
SUT PLAIN 0 NONE (SUTURE) IMPLANT
SUT PLAIN 2 0 XLH (SUTURE) ×1 IMPLANT
SYR CONTROL 10ML LL (SYRINGE) ×2 IMPLANT
TOWEL OR 17X24 6PK STRL BLUE (TOWEL DISPOSABLE) ×4 IMPLANT
TRAY FOLEY CATH 14FR (SET/KITS/TRAYS/PACK) ×2 IMPLANT
WATER STERILE IRR 1000ML POUR (IV SOLUTION) ×2 IMPLANT

## 2011-11-20 NOTE — Anesthesia Preprocedure Evaluation (Signed)
Anesthesia Evaluation  Patient identified by MRN, date of birth, ID band Patient awake    Reviewed: Allergy & Precautions, H&P , Patient's Chart, lab work & pertinent test results  Airway Mallampati: II TM Distance: >3 FB Neck ROM: full    Dental No notable dental hx.    Pulmonary  clear to auscultation  Pulmonary exam normal       Cardiovascular Exercise Tolerance: Good regular Normal    Neuro/Psych    GI/Hepatic   Endo/Other    Renal/GU      Musculoskeletal   Abdominal   Peds  Hematology   Anesthesia Other Findings   Reproductive/Obstetrics                           Anesthesia Physical Anesthesia Plan  ASA: II  Anesthesia Plan: Spinal   Post-op Pain Management:    Induction:   Airway Management Planned:   Additional Equipment:   Intra-op Plan:   Post-operative Plan:   Informed Consent: I have reviewed the patients History and Physical, chart, labs and discussed the procedure including the risks, benefits and alternatives for the proposed anesthesia with the patient or authorized representative who has indicated his/her understanding and acceptance.   Dental Advisory Given  Plan Discussed with: CRNA  Anesthesia Plan Comments: (Lab work confirmed with CRNA in room. Platelets okay. Discussed spinal anesthetic, and patient consents to the procedure:  included risk of possible headache,backache, failed block, allergic reaction, and nerve injury. This patient was asked if she had any questions or concerns before the procedure started. )        Anesthesia Quick Evaluation  

## 2011-11-20 NOTE — ED Provider Notes (Signed)
Diane [name] is a 31 y.o. female @ [redacted]w[redacted]d gestation who presents to MAU via EMS for sudden onset of heavy vaginal bleeding and abdominal pain. Medical screening exam completed. Marlinda Mike, CNM here to evaluate patient.  Labs and ultrasound ordered.  [place], [place] 11/20/11 1918

## 2011-11-20 NOTE — Progress Notes (Signed)
T. Baily CNM seeing another patient at this time and will be by to see patient when finished.

## 2011-11-20 NOTE — Progress Notes (Signed)
Called to evaluate patient - presents with SROM with gross bleeding at 5:30pm tonight. HX previous cesarean section for breech - planned repeat c-section.  No hx previa or low-lying placenta per prenatals / patient confirms no issues with low placenta  + FM   Constant contractions  Bloody gushes of fluid and moderate bleeding on arrival to MAU  SONO ordered by NP - cancelled - not appropriate evaluation of acute bleeding with ctx / SROM  Spec exam - moderate bloody watery discharge Clots visible in cervical OS Sample obtained for ferning - Fern +  Cervix - 1cm / 50% - clots felt in in os  FHR 130 baseline  TOCO ctx every 1-2 minutes / firm to palpation   Fundus firm between contractions   A/P : 35 weeks SROM confirmed          Marginal abruption          Previous cesarean section  1) Dr Billy Coast notifed / OR notifed 2) move for immediate cesarean section

## 2011-11-20 NOTE — Progress Notes (Signed)
Dr. Billy Coast notified of patient arrival by EMS, bright red bleeding, 35 weeks, positive fetal heart tones. He will call Kenney Houseman to come.

## 2011-11-20 NOTE — Op Note (Signed)
Cesarean Section Procedure Note  Indications: abruptio placenta  Pre-operative Diagnosis: 35 week 3 day pregnancy.  Post-operative Diagnosis: same  Surgeon: Lenoard Aden   Assistants: Fredric Mare, CNM  Anesthesia: Local anesthesia 0.25.% bupivacaine and Spinal anesthesia  ASA Class: 2  Procedure Details  The patient was seen in the Holding Room. The risks, benefits, complications, treatment options, and expected outcomes were discussed with the patient.  The patient concurred with the proposed plan, giving informed consent. The risks of anesthesia, infection, bleeding and possible injury to other organs discussed. Injury to bowel, bladder, or ureter with possible need for repair discussed. Possible need for transfusion with secondary risks of hepatitis or HIV acquisition discussed. Post operative complications to include but not limited to DVT, PE and Pneumonia noted. The site of surgery properly noted/marked. The patient was taken to Operating Room # 1, identified as Diane [name] and the procedure verified as C-Section Delivery. A Time Out was held and the above information confirmed.  After induction of anesthesia, the patient was draped and prepped in the usual sterile manner. A Pfannenstiel incision was made and carried down through the subcutaneous tissue to the fascia. Fascial incision was made and extended transversely using Mayo scissors. The fascia was separated from the underlying rectus tissue superiorly and inferiorly. The peritoneum was identified and entered. Peritoneal incision was extended longitudinally. The utero-vesical peritoneal reflection was incised transversely and the bladder flap was bluntly freed from the lower uterine segment. A low transverse uterine incision(Kerr hysterotomy) was made.Large amount of blood encountered upon entry.Delivered from OA presentation was a  Female with Apgar scores of 9 at one minute and 9 at five minutes. After the umbilical cord was clamped  and cut cord blood was obtained for evaluation. The placenta was removed intact and appeared abnormal c/w abruptio. The uterine outline, tubes and ovaries appeared normal. The uterine incision was closed with running locked sutures of 0 Monocryl x 2 layers. Hemostasis was observed. Lavage was carried out until clear. The fascia was then reapproximated with running sutures of 0 Monocryl. The skin was reapproximated with staples. Cord ph pending.  Instrument, sponge, and needle counts were correct prior the abdominal closure and at the conclusion of the case.   Findings: abruption  Estimated Blood Loss:  500         Drains: foley                 Specimens: placenta                 Complications:  None; patient tolerated the procedure well.         Disposition: PACU - hemodynamically stable.         Condition: stable  Attending Attestation: I performed the procedure.

## 2011-11-20 NOTE — Progress Notes (Signed)
Pt presents to MAU with chief complaint of vaginal bleeding, pt presents by EMS. Pt says she first noticed the bright red bleeding around 1530. Pt is [redacted]w[redacted]d, G2P1 prior c-section for breech presentation. Regular prenatal care, pt of Dr. Billy Coast.

## 2011-11-20 NOTE — Transfer of Care (Signed)
Immediate Anesthesia Transfer of Care Note  Patient: Diane [name]  Procedure(s) Performed: * No procedures listed *  Patient Location: PACU  Anesthesia Type: Spinal  Level of Consciousness: alert  and oriented  Airway & Oxygen Therapy: Patient Spontanous Breathing  Post-op Assessment: Report given to PACU RN and Post -op Vital signs reviewed and stable  Post vital signs: stable  Complications: No apparent anesthesia complications

## 2011-11-20 NOTE — Anesthesia Postprocedure Evaluation (Signed)
  Anesthesia Post-op Note  Patient: Diane [name]  Procedure(s) Performed:  CESAREAN SECTION  Patient is awake, responsive, moving her legs, and has signs of resolution of her numbness. Pain and nausea are reasonably well controlled. Vital signs are stable and clinically acceptable. Oxygen saturation is clinically acceptable. There are no apparent anesthetic complications at this time. Patient is ready for discharge.

## 2011-11-20 NOTE — Anesthesia Procedure Notes (Signed)
Spinal  Patient location during procedure: OR Preanesthetic Checklist Completed: patient identified, site marked, surgical consent, pre-op evaluation, timeout performed, IV checked, risks and benefits discussed and monitors and equipment checked Spinal Block Patient position: sitting Prep: DuraPrep Patient monitoring: heart rate, cardiac monitor, continuous pulse ox and blood pressure Approach: midline Location: L3-4 Injection technique: single-shot Needle Needle type: Sprotte  Needle gauge: 24 G Needle length: 9 cm Assessment Sensory level: T4 Additional Notes Spinal Dosage in OR  Bupivicaine ml       1.6 PFMS04   mcg        150 Fentanyl mcg            25    

## 2011-11-21 ENCOUNTER — Encounter (HOSPITAL_COMMUNITY): Payer: Self-pay | Admitting: Anesthesiology

## 2011-11-21 LAB — CBC
Hemoglobin: 9.4 g/dL — ABNORMAL LOW (ref 12.0–15.0)
MCH: 30 pg (ref 26.0–34.0)
MCHC: 33.6 g/dL (ref 30.0–36.0)
Platelets: 150 10*3/uL (ref 150–400)
RDW: 12.7 % (ref 11.5–15.5)

## 2011-11-21 MED ORDER — POLYSACCHARIDE IRON 150 MG PO CAPS
150.0000 mg | ORAL_CAPSULE | Freq: Every day | ORAL | Status: DC
  Administered 2011-11-21 – 2011-11-23 (×3): 150 mg via ORAL
  Filled 2011-11-21 (×3): qty 1

## 2011-11-21 MED ORDER — LACTATED RINGERS IV SOLN
Freq: Once | INTRAVENOUS | Status: AC
  Administered 2011-11-21: 11:00:00 via INTRAVENOUS

## 2011-11-21 NOTE — Anesthesia Postprocedure Evaluation (Signed)
  Anesthesia Post-op Note  Patient: Diane [name]  Procedure(s) Performed:  CESAREAN SECTION  Patient Location: PACU and Mother/Baby  Anesthesia Type: Spinal  Level of Consciousness: awake, alert  and oriented  Airway and Oxygen Therapy: Patient Spontanous Breathing  Post-op Pain: none  Post-op Assessment: Post-op Vital signs reviewed  Post-op Vital Signs: Reviewed and stable  Complications: No apparent anesthesia complications

## 2011-11-21 NOTE — Plan of Care (Signed)
Problem: Phase II Progression Outcomes Goal: Incision intact & without signs/symptoms of infection Outcome: Completed/Met Date Met:  11/21/11 staples

## 2011-11-21 NOTE — Progress Notes (Signed)
Patient was referred for history of depression/anxiety. * Referral screened out by Clinical Social Worker because none of the following criteria appear to apply: ~ History of anxiety/depression during this pregnancy, or of post-partum depression. ~ Diagnosis of anxiety and/or depression within last 3 years ~ History of depression due to pregnancy loss/loss of child OR * Patient's symptoms currently being treated with medication and/or therapy. Please contact the Clinical Social Worker if needs arise, or if patient requests.  Patient is currently taking Effexor.  Pt was also referred for rx drug abuse, but chart notes that she received treatment and this is a past hx.  The baby was positive for Barbiturates so SW contacted [facility] pharmacist to see if there is anything in pt's list of medications that would cause this.  She verified that pt is on a medication for headaches that would cause a positive screen for barbiturates.  Therefore, SW has screened out this referral at this time. 

## 2011-11-21 NOTE — Progress Notes (Signed)
Patient ID: Diane Diane [name] Diane [name], female   DOB: 03/23/1981, 31 y.o.   MRN: [id number] POD # 1  Subjective: Pt reports feeling well/ Pain controlled with narcotic analgesics including percocet Tolerating po/ Foley patent/ No n/v/Flatus neg Activity: out of bed and ambulate Bleeding is light Newborn info:  Information for the patient's newborn:  Diane Diane [name], Diane [name] [[id number]]  female Feeding: breast   Objective: VS: Blood pressure 130/69, pulse 75, temperature 98.8 F (37.1 C), temperature source Oral, resp. rate 18, height 5\' 7"  (1.702 m), weight 93.441 kg (206 lb), SpO2 96.00%, unknown if currently breastfeeding.    Intake/Output Summary (Last 24 hours) at 11/21/11 1020 Last data filed at 11/21/11 0650  Gross per 24 hour  Intake 4054.57 ml  Output   2250 ml  Net 1804.57 ml      Basename 11/21/11 0525 11/20/11 1900  WBC 14.2* 12.2*  HGB 9.4* 10.6*  HCT 28.0* 31.3*  PLT 150 [id number]    Blood type: --/--/A POS, A POS (01/03 1900) Rubella:      Physical Exam:  General: alert, cooperative and no distress CV: Regular rate and rhythm Resp: clear Abdomen: soft, nontender, normal bowel sounds Incision: Dressing in place and dry/intact Uterine Fundus: firm, below umbilicus, nontender Lochia: minimal Ext: edema trace and Homans sign is negative, no sign of DVT    A/P: POD # 1/ [id number] S/P C/Section d/t Mild Abruption @ 35 weeks and planned repeat Mild ABL Anemia; will start Fe supplement Will d/c pitocin and give IV bolus; observe output Hx HA's (Benign Intracranial HTN) and on labetalol Hx depression and anxiety; on Effexor Doing well Continue routine post op orders   Sign: Diane Diane [name] Diane [name], [facility] 11/21/11 1015

## 2011-11-22 ENCOUNTER — Encounter (HOSPITAL_COMMUNITY): Payer: Self-pay

## 2011-11-22 MED ORDER — PANTOPRAZOLE SODIUM 20 MG PO TBEC
20.0000 mg | DELAYED_RELEASE_TABLET | Freq: Every day | ORAL | Status: DC
  Administered 2011-11-22: 20 mg via ORAL
  Filled 2011-11-22 (×2): qty 1

## 2011-11-22 MED ORDER — IBUPROFEN 600 MG PO TABS
600.0000 mg | ORAL_TABLET | Freq: Four times a day (QID) | ORAL | Status: DC
  Administered 2011-11-22 – 2011-11-23 (×3): 600 mg via ORAL
  Filled 2011-11-22 (×3): qty 1

## 2011-11-22 NOTE — Progress Notes (Signed)
Subjective: POD# 2 Information for the patient's newborn:  Diane [name], Diane [name] [[id number]]  female   Reports feeling well. Feeding: breast, working on latch Patient reports tolerating PO.  Breast symptoms: (+) colostrum Pain controlled with Percocet and motrin Denies HA/SOB/C/P/N/V/dizziness. Flatus present. She reports vaginal bleeding as normal, without clots.  She is ambulating, urinating without difficult.     Objective:   VS:  Filed Vitals:   11/21/11 1245 11/21/11 1700 11/21/11 2100 11/22/11 0651  BP: 124/71 110/68 110/68 122/78  Pulse: 82 74 75 75  Temp: 99.1 F (37.3 C) 98.2 F (36.8 C) 98.7 F (37.1 C) 98.2 F (36.8 C)  TempSrc: Oral Oral Oral Oral  Resp: 18 24 20 18   Height:      Weight:      SpO2: 97%        Intake/Output Summary (Last 24 hours) at 11/22/11 1001 Last data filed at 11/21/11 1700  Gross per 24 hour  Intake      0 ml  Output   1950 ml  Net  -1950 ml        Basename 11/21/11 0525 11/20/11 1900  WBC 14.2* 12.2*  HGB 9.4* 10.6*  HCT 28.0* 31.3*  PLT 150 [id number]     Blood type: --/--/A POS, A POS (01/03 1900)  Rubella:   immune    Physical Exam:  General: alert, cooperative, no distress and appears tired CV: Regular rate and rhythm Resp: clear Abdomen: soft, nontender, normal bowel sounds Incision: clean, dry, intact, no drainage present and staples in place Uterine Fundus: firm, below umbilicus, nontender Lochia: minimal Ext: edema trace and Homans sign is negative, no sign of DVT      Assessment/Plan: 31 y.o.  status post Cesarean section. POD# 2.  s/p Cesarean Delivery.  Indications: partial abruption at 35 wks, previous c/s x1                Active Problems:  Postpartum care following cesarean delivery (1/3)  Maternal anemia, with delivery  -asymptomatic, continue oral Fe and colace Doing well, stable.    Ambulate Routine post-op care Lactation support today for latch  PAUL,DANIELA 11/22/2011, 10:01 AM

## 2011-11-23 MED ORDER — IBUPROFEN 600 MG PO TABS: 600.0000 mg | ORAL_TABLET | Freq: Four times a day (QID) | ORAL | Status: AC

## 2011-11-23 MED ORDER — OXYCODONE-ACETAMINOPHEN 5-325 MG PO TABS: 1.0000 | ORAL_TABLET | ORAL | Status: AC | PRN

## 2011-11-23 NOTE — Discharge Summary (Signed)
POSTOPERATIVE DISCHARGE SUMMARY:  Patient ID: Diane [name] MRN: [id number] DOB/AGE: June 26, 1981 31 y.o.  Admit date: 11/20/2011 Discharge date:  11/23/2011   Admission Diagnoses: 1. 35 weeks SROM confirmed  2. Marginal abruption  3. Previous cesarean section   Discharge Diagnoses:  1. Post-op day 3, s/p emergent cesarean section, repeat cesarean section 2. Maternal anemia - asymptomatic  Prenatal history: [id number]   EDC : 12/25/2011, Alternate EDD Entry  Prenatal care at [facility] since 1st trimester  Prenatal course complicated by hx previous cesarean section, chronic hypertension, benign intracranial hypertension, preterm contractions, hx of prescription drug abuse.   Prenatal Labs: ABO, Rh: A (01/03 0000)  Antibody: NEG (01/03 1900) Rubella:   immune RPR: Nonreactive (07/23 0000)  HBsAg: Negative (07/23 0000)  HIV: Non-reactive (07/23 0000)  GBS:   unknown 1 hr Glucola : normal   Medical / Surgical History :  Past medical history:  Past Medical History  Diagnosis Date  . ADD (attention deficit disorder with hyperactivity)   . Depression   . IBS (irritable bowel syndrome)   . Headache     Dr Annia Belt at  [facility]  . PONV (postoperative nausea and vomiting)   . Maternal anemia, with delivery 11/22/2011    Past surgical history:  Past Surgical History  Procedure Date  . Cesarean section   . Extraction of wisdom teeth    . Tonsillectomy     Family History:  Family History  Problem Relation Age of Onset  . Alcohol abuse      family hx  . Arthritis      family hx  . Melanoma      family hx  . Aneurysm      family hx  . Brain cancer Father     TUMOR  . Anesthesia problems Father   . Anesthesia problems Mother     Social History:  reports that she quit smoking about 3 years ago. She has never used smokeless tobacco. She reports that she does not drink alcohol or use illicit drugs.   Allergies: Metoclopramide hcl;  Nsaids; and Prochlorperazine edisylate    Current Medications at time of admission:  Prescriptions prior to admission  Medication Sig Dispense Refill  . butalbital-acetaminophen-caffeine (FIORICET, ESGIC) 50-325-40 MG per tablet Take 1 tablet by mouth daily as needed. For headache       . chlorpheniramine-hydrocodone (TUSSIONEX) 8-10 MG/5ML suspension Take 5 mLs by mouth every 12 (twelve) hours as needed for Cough.  240 mL  0  . cyclobenzaprine (FLEXERIL) 10 MG tablet Take 1 tablet by mouth Every 6 hours as needed. For headache      . Fe Bisgly-Succ-C-Thre-B12-FA (IRON-150 PO) Take 1 capsule by mouth daily.        Marland Kitchen labetalol (NORMODYNE) 100 MG tablet Take 1 tablet (100 mg total) by mouth 2 (two) times daily.  180 tablet  3  . lansoprazole (PREVACID) 30 MG capsule Take 1 capsule (30 mg total) by mouth 2 (two) times daily at 10 AM and 5 PM.  90 capsule  0  . loratadine (CLARITIN) 10 MG tablet Take 10 mg by mouth daily as needed. For allergies       . ondansetron (ZOFRAN-ODT) 8 MG disintegrating tablet Take 8 mg by mouth every 12 (twelve) hours as needed. For nausea       . polyethylene glycol powder (GLYCOLAX/MIRALAX) powder Take 17 g by mouth daily as needed. For constipation      . Prenat-FeFum-FePo-FA-Omega  3 (TARON-C DHA) 53.5-38-1 MG CAPS Take 1 capsule by mouth at bedtime.  90 capsule  0  . pyridoxine (B-6) 100 MG tablet Take 100 mg by mouth daily.        . Riboflavin (VITAMIN B-2 PO) Take 1 tablet by mouth 2 (two) times daily. To prevent migraine       . SUMAtriptan (IMITREX) 100 MG tablet Take 1 tablet (100 mg total) by mouth once as needed for migraine.  27 tablet  1  . venlafaxine (EFFEXOR-XR) 75 MG 24 hr capsule Take 3 capsules (225 mg total) by mouth daily.  90 capsule  6  . DISCONTD: Magnesium 500 MG TABS Take 1,000 mg by mouth daily.        Marland Kitchen DISCONTD: NIFEdipine (PROCARDIA) 10 MG capsule Take 10 mg by mouth 3 (three) times daily as needed. For contractions             Intrapartum Course: Patient presented to [facility] via emergency medical transport with acute vaginal bleeding and spontaneous preterm rupture of membranes, uterine contractions every 1-2 minutes and uterus palpated firm. Fetal heart tones stable at 130 BPM but given history of previous cesarean section and suspect abruptio, patient was counseled and prepared for emergent cesarean section.   Procedures: Cesarean section delivery of female newborn by Dr Billy Coast  See operative report for further details  Postoperative / postpartum course:  Uneventful. Mild maternal anemia post-operative managed with oral iron supplementation. Patient experienced moderate pain control until non-steroidal antiinflammatory regimen was added which patient had initially declined due to a remote history of gastric ulcers. Adequate pain control reported after ibuprofen regimen was started.   Physical Exam:  VSS: Blood pressure 132/85, pulse 75, temperature 98.6 F (37 C), temperature source Oral, resp. rate 18, height 5\' 7"  (1.702 m), weight 93.441 kg (206 lb), SpO2 97.00%, unknown if currently breastfeeding.  Admit labs: hgb 10.6, hct 31.3, plts 167  LABS:  Lab Results  Component Value Date   WBC 14.2* 11/21/2011   HGB 9.4* 11/21/2011   HCT 28.0* 11/21/2011   MCV 89.5 11/21/2011   PLT 150 11/21/2011      Incision:  approximated with staples / no erythema / no ecchymosis / no drainage Staples: for removal in office on post-op day 5  Discharge Instructions:  Discharged Condition: good Activity: pelvic rest, weight lifting restrictions x 2 weeks Diet: routine Medications:  Current Discharge Medication List    START taking these medications   Details  ibuprofen (ADVIL,MOTRIN) 600 MG tablet Take 1 tablet (600 mg total) by mouth every 6 (six) hours. Qty: 60 tablet, Refills: 0    oxyCODONE-acetaminophen (PERCOCET) 5-325 MG per tablet Take 1-2 tablets by mouth every 3 (three) hours as needed (moderate - severe  pain). Qty: 30 tablet, Refills: 0      CONTINUE these medications which have NOT CHANGED   Details  butalbital-acetaminophen-caffeine (FIORICET, ESGIC) 50-325-40 MG per tablet Take 1 tablet by mouth daily as needed. For headache     chlorpheniramine-hydrocodone (TUSSIONEX) 8-10 MG/5ML suspension Take 5 mLs by mouth every 12 (twelve) hours as needed for Cough. Qty: 240 mL, Refills: 0   Associated Diagnoses: Bronchitis    cyclobenzaprine (FLEXERIL) 10 MG tablet Take 1 tablet by mouth Every 6 hours as needed. For headache    Fe Bisgly-Succ-C-Thre-B12-FA (IRON-150 PO) Take 1 capsule by mouth daily.      labetalol (NORMODYNE) 100 MG tablet Take 1 tablet (100 mg total) by mouth 2 (two) times daily. Qty:  180 tablet, Refills: 3    lansoprazole (PREVACID) 30 MG capsule Take 1 capsule (30 mg total) by mouth 2 (two) times daily at 10 AM and 5 PM. Qty: 90 capsule, Refills: 0   Associated Diagnoses: GERD (gastroesophageal reflux disease)    loratadine (CLARITIN) 10 MG tablet Take 10 mg by mouth daily as needed. For allergies     ondansetron (ZOFRAN-ODT) 8 MG disintegrating tablet Take 8 mg by mouth every 12 (twelve) hours as needed. For nausea     polyethylene glycol powder (GLYCOLAX/MIRALAX) powder Take 17 g by mouth daily as needed. For constipation    Prenat-FeFum-FePo-FA-Omega 3 (TARON-C DHA) 53.5-38-1 MG CAPS Take 1 capsule by mouth at bedtime. Qty: 90 capsule, Refills: 0    pyridoxine (B-6) 100 MG tablet Take 100 mg by mouth daily.      Riboflavin (VITAMIN B-2 PO) Take 1 tablet by mouth 2 (two) times daily. To prevent migraine     SUMAtriptan (IMITREX) 100 MG tablet Take 1 tablet (100 mg total) by mouth once as needed for migraine. Qty: 27 tablet, Refills: 1   Associated Diagnoses: Migraine    venlafaxine (EFFEXOR-XR) 75 MG 24 hr capsule Take 3 capsules (225 mg total) by mouth daily. Qty: 90 capsule, Refills: 6   Associated Diagnoses: Migraine; Anxiety      STOP taking these  medications     Magnesium 500 MG TABS      NIFEdipine (PROCARDIA) 10 MG capsule        Condition: stable Postpartum Instructions: refer to practice specific booklet Discharge to: home Disposition: Home or Self Care Follow up :  Follow-up Information    Follow up with Lenoard Aden, MD in 6 weeks.   Contact information:   [address] [place] [address] [phone]       Follow up with [facility] in 2 days. (for staple removal)           Signed: Jermon Chalfant 11/23/2011, 8:55 AM

## 2011-11-23 NOTE — Progress Notes (Signed)
Subjective: POD# 3 Information for the patient's newborn:  Diane [name], Diane [name] [[id number]]  female   Reports feeling better today, pain better-controlled since adding Motrin last night. Patient had refused NSAID d/t hx of gastric ulcers in remote past. Hx narcotics abuse in the past, patient had max'ed out on Percocet last night Feeding: breast / pumping and finger feeding Patient reports tolerating PO.  Breast symptoms: milk is in  Denies HA/SOB/C/P/N/V/dizziness. Flatus present, no BM. She reports vaginal bleeding as normal, without clots.  She is ambulating, urinating without difficult.     Objective:   VS:  Filed Vitals:   11/22/11 0651 11/22/11 1500 11/22/11 2120 11/23/11 0510  BP: 122/78 121/73 128/76 132/85  Pulse: 75 77 83 75  Temp: 98.2 F (36.8 C) 97.8 F (36.6 C) 98.6 F (37 C) 98.6 F (37 C)  TempSrc: Oral Oral Oral Oral  Resp: 18 20 20 18   Height:      Weight:      SpO2:        No intake or output data in the 24 hours ending 11/23/11 0839      Basename 11/21/11 0525 11/20/11 1900  WBC 14.2* 12.2*  HGB 9.4* 10.6*  HCT 28.0* 31.3*  PLT 150 [id number]     Blood type: --/--/A POS, A POS (01/03 1900)  Rubella:   immune    Physical Exam:  General: alert, cooperative and no distress CV: Regular rate and rhythm Resp: clear Abdomen: soft, nontender, normal bowel sounds Incision: clean, dry, intact and staples in place Uterine Fundus: firm, below umbilicus, appropriately tender Lochia: minimal Ext: edema trace and Homans sign is negative, no sign of DVT   Assessment/Plan: 31 y.o.  status post Cesarean section. POD# 3.  s/p Cesarean Delivery.  Indications: abruptio at 35 wks                Active Problems:  Postpartum care following cesarean delivery (1/3)  Maternal anemia, with delivery  -asymptomatic, continue oral Fe and colace Doing well, stable post-op Pain controlled improved with added NSAID Protonix added for gastric protection                Routine post-op care D/C home  Staples for removal at [facility] in 2 days  Nocholas Damaso 11/23/2011, 8:39 AM

## 2011-11-24 ENCOUNTER — Encounter (HOSPITAL_COMMUNITY): Payer: Self-pay | Admitting: Obstetrics and Gynecology

## 2011-11-24 ENCOUNTER — Encounter (HOSPITAL_COMMUNITY)
Admission: RE | Admit: 2011-11-24 | Discharge: 2011-11-24 | Disposition: A | Discharge status: Home / Self Care | Payer: BC Managed Care – PPO | Source: Ambulatory Visit | Attending: Obstetrics and Gynecology | Admitting: Obstetrics and Gynecology

## 2011-11-24 DIAGNOSIS — O923 Agalactia: Secondary | ICD-10-CM | POA: Insufficient documentation

## 2011-12-03 ENCOUNTER — Encounter (HOSPITAL_COMMUNITY): Payer: BC Managed Care – PPO

## 2011-12-12 ENCOUNTER — Ambulatory Visit (INDEPENDENT_AMBULATORY_CARE_PROVIDER_SITE_OTHER): Payer: BC Managed Care – PPO | Admitting: Family Medicine

## 2011-12-12 ENCOUNTER — Encounter: Payer: Self-pay | Admitting: Family Medicine

## 2011-12-12 DIAGNOSIS — J111 Influenza due to unidentified influenza virus with other respiratory manifestations: Secondary | ICD-10-CM

## 2011-12-12 MED ORDER — HYDROCODONE-HOMATROPINE 5-1.5 MG/5ML PO SYRP: 5.0000 mL | ORAL_SOLUTION | ORAL | Status: AC | PRN

## 2011-12-12 MED ORDER — PROMETHAZINE HCL 50 MG/ML IJ SOLN
50.0000 mg | Freq: Once | INTRAMUSCULAR | Status: AC
  Administered 2011-12-12: 50 mg via INTRAMUSCULAR

## 2011-12-12 MED ORDER — PROMETHAZINE HCL 25 MG RE SUPP: 25.0000 mg | RECTAL | Status: DC | PRN

## 2011-12-12 NOTE — Progress Notes (Signed)
  Subjective:    Patient ID: Diane [name], female    DOB: 1981/07/22, 31 y.o.   MRN: [id number]  HPI Here with her husband for 2 days of fevers, body aches, HA, ST, dry cough, nausea and vomiting, and diarrhea. She did get a flu shot last fall. Drinking fluids. Taking Ibuprofen and Mucinex. She had a baby several weeks ago, and this went well. She is not breast feeding.    Review of Systems  Constitutional: Positive for fever.  HENT: Positive for sore throat.   Eyes: Negative.   Respiratory: Positive for cough.   Gastrointestinal: Positive for nausea, vomiting and diarrhea. Negative for abdominal pain, constipation and blood in stool.       Objective:   Physical Exam  Constitutional: She appears well-developed and well-nourished.  HENT:  Right Ear: External ear normal.  Left Ear: External ear normal.  Nose: Nose normal.  Mouth/Throat: Oropharynx is clear and moist. No oropharyngeal exudate.  Eyes: Conjunctivae are normal.  Pulmonary/Chest: Effort normal and breath sounds normal.  Abdominal: Soft. Bowel sounds are normal. She exhibits no distension and no mass. There is no tenderness. There is no rebound and no guarding.  Lymphadenopathy:    She has no cervical adenopathy.          Assessment & Plan:  Influenza. Continue fluids at home. Given a shot of Phenergan, as well as suppositories. Recheck prn

## 2011-12-19 ENCOUNTER — Encounter (HOSPITAL_COMMUNITY)
Admission: AD | Disposition: A | Discharge status: Home / Self Care | Payer: Self-pay | Source: Ambulatory Visit | Attending: Obstetrics and Gynecology

## 2011-12-19 SURGERY — Surgical Case
Anesthesia: Spinal

## 2011-12-25 ENCOUNTER — Encounter (HOSPITAL_COMMUNITY)
Admission: RE | Admit: 2011-12-25 | Discharge: 2011-12-25 | Disposition: A | Discharge status: Home / Self Care | Payer: BC Managed Care – PPO | Source: Ambulatory Visit | Attending: Obstetrics and Gynecology | Admitting: Obstetrics and Gynecology

## 2011-12-25 DIAGNOSIS — O923 Agalactia: Secondary | ICD-10-CM | POA: Insufficient documentation

## 2012-01-23 ENCOUNTER — Encounter (HOSPITAL_COMMUNITY)
Admission: RE | Admit: 2012-01-23 | Discharge: 2012-01-23 | Disposition: A | Discharge status: Home / Self Care | Payer: BC Managed Care – PPO | Source: Ambulatory Visit | Attending: Obstetrics and Gynecology | Admitting: Obstetrics and Gynecology

## 2012-01-23 DIAGNOSIS — O923 Agalactia: Secondary | ICD-10-CM | POA: Insufficient documentation

## 2012-02-23 ENCOUNTER — Encounter (HOSPITAL_COMMUNITY)
Admission: RE | Admit: 2012-02-23 | Discharge: 2012-02-23 | Disposition: A | Discharge status: Home / Self Care | Payer: BC Managed Care – PPO | Source: Ambulatory Visit | Attending: Obstetrics and Gynecology | Admitting: Obstetrics and Gynecology

## 2012-02-23 DIAGNOSIS — O923 Agalactia: Secondary | ICD-10-CM | POA: Insufficient documentation

## 2012-03-12 ENCOUNTER — Other Ambulatory Visit: Payer: Self-pay | Admitting: Family Medicine

## 2012-03-25 ENCOUNTER — Encounter (HOSPITAL_COMMUNITY)
Admission: RE | Admit: 2012-03-25 | Discharge: 2012-03-25 | Disposition: A | Discharge status: Home / Self Care | Payer: BC Managed Care – PPO | Source: Ambulatory Visit | Attending: Obstetrics and Gynecology | Admitting: Obstetrics and Gynecology

## 2012-03-25 DIAGNOSIS — O923 Agalactia: Secondary | ICD-10-CM | POA: Insufficient documentation

## 2012-04-25 ENCOUNTER — Encounter (HOSPITAL_COMMUNITY)
Admission: RE | Admit: 2012-04-25 | Discharge: 2012-04-25 | Disposition: A | Discharge status: Home / Self Care | Payer: BC Managed Care – PPO | Source: Ambulatory Visit | Attending: Obstetrics and Gynecology | Admitting: Obstetrics and Gynecology

## 2012-04-25 DIAGNOSIS — O923 Agalactia: Secondary | ICD-10-CM | POA: Insufficient documentation

## 2012-04-30 ENCOUNTER — Ambulatory Visit (INDEPENDENT_AMBULATORY_CARE_PROVIDER_SITE_OTHER): Payer: BC Managed Care – PPO | Admitting: Family Medicine

## 2012-04-30 ENCOUNTER — Encounter: Payer: Self-pay | Admitting: Family Medicine

## 2012-04-30 VITALS — BP 116/70 | HR 84 | Temp 98.3°F | Wt 192.0 lb

## 2012-04-30 DIAGNOSIS — D649 Anemia, unspecified: Secondary | ICD-10-CM

## 2012-04-30 DIAGNOSIS — R5383 Other fatigue: Secondary | ICD-10-CM

## 2012-04-30 LAB — BASIC METABOLIC PANEL
BUN: 14 mg/dL (ref 6–23)
Chloride: 109 mEq/L (ref 96–112)
Glucose, Bld: 55 mg/dL — ABNORMAL LOW (ref 70–99)
Potassium: 4.1 mEq/L (ref 3.5–5.1)

## 2012-04-30 LAB — HEPATIC FUNCTION PANEL
ALT: 20 U/L (ref 0–35)
AST: 22 U/L (ref 0–37)
Albumin: 4 g/dL (ref 3.5–5.2)
Total Bilirubin: 0.4 mg/dL (ref 0.3–1.2)

## 2012-04-30 LAB — CBC WITH DIFFERENTIAL/PLATELET
Basophils Absolute: 0 10*3/uL (ref 0.0–0.1)
Eosinophils Absolute: 0.4 10*3/uL (ref 0.0–0.7)
HCT: 33.1 % — ABNORMAL LOW (ref 36.0–46.0)
Lymphs Abs: 1.5 10*3/uL (ref 0.7–4.0)
MCHC: 33.7 g/dL (ref 30.0–36.0)
Monocytes Relative: 8.1 % (ref 3.0–12.0)
Platelets: 219 10*3/uL (ref 150.0–400.0)
RDW: 12.8 % (ref 11.5–14.6)

## 2012-04-30 LAB — VITAMIN B12: Vitamin B-12: 535 pg/mL (ref 211–911)

## 2012-04-30 LAB — TSH: TSH: 0.79 u[IU]/mL (ref 0.35–5.50)

## 2012-04-30 NOTE — Progress Notes (Signed)
  Subjective:    Patient ID: Diane [name], female    DOB: 1981/07/26, 31 y.o.   MRN: [id number]  HPI Here to discuss fatigue. She feels tired all the time despite getting plenty of sleep. No other specific issues. She is no longer breast feeding. Trying to get some exercise. She has known anemia, and she takes one iron capsule daily. The last Hgb I can find on her chart was a 9.4 in January around the time she delivered her baby.    Review of Systems  Constitutional: Positive for fatigue. Negative for activity change, appetite change and unexpected weight change.  Respiratory: Negative.   Cardiovascular: Negative.        Objective:   Physical Exam  Constitutional: She appears well-developed and well-nourished.  Neck: No thyromegaly present.  Cardiovascular: Normal rate, regular rhythm, normal heart sounds and intact distal pulses.   Pulmonary/Chest: Effort normal and breath sounds normal.  Lymphadenopathy:    She has no cervical adenopathy.          Assessment & Plan:  Increase the iron capsules to twice a day. Get labs

## 2012-05-04 ENCOUNTER — Telehealth: Payer: Self-pay | Admitting: Family Medicine

## 2012-05-04 NOTE — Telephone Encounter (Signed)
Pt called req to get lab results.  °

## 2012-05-04 NOTE — Telephone Encounter (Signed)
I spoke with pt's mom and gave results, okay per the pt.

## 2012-05-04 NOTE — Progress Notes (Signed)
Quick Note:  I spoke with pt's mom and gave results. ______ 

## 2012-05-26 ENCOUNTER — Encounter (HOSPITAL_COMMUNITY)
Admission: RE | Admit: 2012-05-26 | Discharge: 2012-05-26 | Disposition: A | Discharge status: Home / Self Care | Payer: BC Managed Care – PPO | Source: Ambulatory Visit | Attending: Obstetrics and Gynecology | Admitting: Obstetrics and Gynecology

## 2012-05-26 DIAGNOSIS — O923 Agalactia: Secondary | ICD-10-CM | POA: Insufficient documentation

## 2012-06-26 ENCOUNTER — Encounter (HOSPITAL_COMMUNITY)
Admission: RE | Admit: 2012-06-26 | Discharge: 2012-06-26 | Disposition: A | Discharge status: Home / Self Care | Payer: BC Managed Care – PPO | Source: Ambulatory Visit | Attending: Obstetrics and Gynecology | Admitting: Obstetrics and Gynecology

## 2012-06-26 DIAGNOSIS — O923 Agalactia: Secondary | ICD-10-CM | POA: Insufficient documentation

## 2012-07-11 ENCOUNTER — Other Ambulatory Visit: Payer: Self-pay | Admitting: Family Medicine

## 2012-07-27 ENCOUNTER — Encounter (HOSPITAL_COMMUNITY)
Admission: RE | Admit: 2012-07-27 | Discharge: 2012-07-27 | Disposition: A | Discharge status: Home / Self Care | Payer: BC Managed Care – PPO | Source: Ambulatory Visit | Attending: Obstetrics and Gynecology | Admitting: Obstetrics and Gynecology

## 2012-07-27 DIAGNOSIS — O923 Agalactia: Secondary | ICD-10-CM | POA: Insufficient documentation

## 2012-08-27 ENCOUNTER — Encounter (HOSPITAL_COMMUNITY)
Admission: RE | Admit: 2012-08-27 | Discharge: 2012-08-27 | Disposition: A | Discharge status: Home / Self Care | Payer: BC Managed Care – PPO | Source: Ambulatory Visit | Attending: Obstetrics and Gynecology | Admitting: Obstetrics and Gynecology

## 2012-08-27 DIAGNOSIS — O923 Agalactia: Secondary | ICD-10-CM | POA: Insufficient documentation

## 2012-09-27 ENCOUNTER — Encounter (HOSPITAL_COMMUNITY)
Admission: RE | Admit: 2012-09-27 | Discharge: 2012-09-27 | Disposition: A | Discharge status: Home / Self Care | Payer: BC Managed Care – PPO | Source: Ambulatory Visit | Attending: Obstetrics and Gynecology | Admitting: Obstetrics and Gynecology

## 2012-09-27 DIAGNOSIS — O923 Agalactia: Secondary | ICD-10-CM | POA: Insufficient documentation

## 2012-09-28 ENCOUNTER — Encounter: Payer: Self-pay | Admitting: Family Medicine

## 2012-09-28 ENCOUNTER — Ambulatory Visit (INDEPENDENT_AMBULATORY_CARE_PROVIDER_SITE_OTHER): Payer: Self-pay | Admitting: Family Medicine

## 2012-09-28 VITALS — BP 110/70 | HR 85 | Temp 98.7°F | Wt 194.0 lb

## 2012-09-28 DIAGNOSIS — M722 Plantar fascial fibromatosis: Secondary | ICD-10-CM

## 2012-09-28 DIAGNOSIS — Z23 Encounter for immunization: Secondary | ICD-10-CM

## 2012-09-28 MED ORDER — DICLOFENAC SODIUM 75 MG PO TBEC: 75.0000 mg | DELAYED_RELEASE_TABLET | Freq: Two times a day (BID) | ORAL | Status: DC

## 2012-09-28 MED ORDER — FLUTICASONE PROPIONATE 50 MCG/ACT NA SUSP: 2.0000 | Freq: Every day | NASAL | Status: DC

## 2012-09-28 NOTE — Addendum Note (Signed)
Addended by: Aniceto Boss A on: 09/28/2012 12:16 PM   Modules accepted: Orders

## 2012-09-28 NOTE — Progress Notes (Signed)
  Subjective:    Patient ID: Diane [name], female    DOB: [birth year], 31 y.o.   MRN: [id number]  HPI Here for 2 weeks of intermittent pains in the left heel and arch. No recent trauma. She wears mostly athletic shoes or boots. She has tried Advil for the pain.    Review of Systems  Constitutional: Negative.        Objective:   Physical Exam  Constitutional: She appears well-developed and well-nourished.  Musculoskeletal:       Tender over the inferior left heel, no swelling           Assessment & Plan:  Wear gel arch supports, try hot soaks, and use Diclofenac. Recheck prn

## 2012-10-27 ENCOUNTER — Encounter (HOSPITAL_COMMUNITY): Payer: BC Managed Care – PPO

## 2012-10-27 ENCOUNTER — Encounter (HOSPITAL_COMMUNITY)
Admission: RE | Admit: 2012-10-27 | Discharge: 2012-10-27 | Disposition: A | Discharge status: Home / Self Care | Payer: BC Managed Care – PPO | Source: Ambulatory Visit | Attending: Obstetrics and Gynecology | Admitting: Obstetrics and Gynecology

## 2012-10-27 DIAGNOSIS — O923 Agalactia: Secondary | ICD-10-CM | POA: Insufficient documentation

## 2012-11-26 ENCOUNTER — Encounter (HOSPITAL_COMMUNITY): Payer: BC Managed Care – PPO

## 2012-11-27 ENCOUNTER — Encounter (HOSPITAL_COMMUNITY)
Admission: RE | Admit: 2012-11-27 | Discharge: 2012-11-27 | Disposition: A | Discharge status: Home / Self Care | Payer: BC Managed Care – PPO | Source: Ambulatory Visit | Attending: Obstetrics and Gynecology | Admitting: Obstetrics and Gynecology

## 2012-11-27 DIAGNOSIS — O923 Agalactia: Secondary | ICD-10-CM | POA: Insufficient documentation

## 2012-12-27 ENCOUNTER — Ambulatory Visit (INDEPENDENT_AMBULATORY_CARE_PROVIDER_SITE_OTHER): Payer: Self-pay | Admitting: Family Medicine

## 2012-12-27 ENCOUNTER — Encounter: Payer: Self-pay | Admitting: Family Medicine

## 2012-12-27 VITALS — BP 114/68 | HR 101 | Temp 98.5°F | Wt 198.0 lb

## 2012-12-27 DIAGNOSIS — J209 Acute bronchitis, unspecified: Secondary | ICD-10-CM

## 2012-12-27 MED ORDER — FE BISGLY-FE POLYSAC-C-B12-FA PO CAPS: 1.0000 | ORAL_CAPSULE | Freq: Every day | ORAL | Status: DC

## 2012-12-27 MED ORDER — CLARITHROMYCIN 500 MG PO TABS: 500.0000 mg | ORAL_TABLET | Freq: Two times a day (BID) | ORAL | Status: DC

## 2012-12-27 MED ORDER — HYDROCOD POLST-CHLORPHEN POLST 10-8 MG/5ML PO LQCR: 5.0000 mL | Freq: Two times a day (BID) | ORAL | Status: DC | PRN

## 2012-12-27 MED ORDER — METHYLPREDNISOLONE 4 MG PO KIT: PACK | ORAL | Status: AC

## 2012-12-27 NOTE — Progress Notes (Signed)
  Subjective:    Patient ID: Diane [name], female    DOB: Jan 19, 1981, 32 y.o.   MRN: [id number]  HPI Here for 4 days of sinus pressure, PND, hoarseness, and a dry cough. No fever.    Review of Systems  Constitutional: Negative.   HENT: Positive for congestion, postnasal drip and sinus pressure.   Eyes: Negative.   Respiratory: Positive for cough.        Objective:   Physical Exam  Constitutional: She appears well-developed and well-nourished.  HENT:  Right Ear: External ear normal.  Left Ear: External ear normal.  Nose: Nose normal.  Mouth/Throat: Oropharynx is clear and moist.  Eyes: Conjunctivae are normal.  Pulmonary/Chest: Effort normal and breath sounds normal.  Lymphadenopathy:    She has no cervical adenopathy.          Assessment & Plan:  Drink fluids and add Mucinex.

## 2012-12-28 ENCOUNTER — Encounter (HOSPITAL_COMMUNITY)
Admission: RE | Admit: 2012-12-28 | Discharge: 2012-12-28 | Disposition: A | Discharge status: Home / Self Care | Payer: Self-pay | Source: Ambulatory Visit | Attending: Obstetrics and Gynecology | Admitting: Obstetrics and Gynecology

## 2012-12-28 DIAGNOSIS — O923 Agalactia: Secondary | ICD-10-CM | POA: Insufficient documentation

## 2012-12-29 ENCOUNTER — Telehealth: Payer: Self-pay | Admitting: Family Medicine

## 2012-12-29 NOTE — Telephone Encounter (Signed)
Patient seen in office on Mon 2/10, started and took all of Medrol Dose Pak for first day dosage yesterday 2/11. Took morning dose this am 2/12,  Now 31 y.o. son has taken Rx and hidden it. Can't find it anywhere, even when he said threw it outside in snow.  Spoke [facility] to [facility] [phone], okay remainder of Medrol Dosepak 4mg  that has been lost.  Patient connected to pharmacist to arrange pickup of med due to snow/transportation.

## 2012-12-29 NOTE — Telephone Encounter (Signed)
Opened in error//slm

## 2013-01-03 NOTE — Telephone Encounter (Signed)
noted 

## 2013-01-12 ENCOUNTER — Telehealth: Payer: Self-pay | Admitting: Family Medicine

## 2013-01-12 MED ORDER — CEFUROXIME AXETIL 500 MG PO TABS: 500.0000 mg | ORAL_TABLET | Freq: Two times a day (BID) | ORAL | Status: DC

## 2013-01-12 MED ORDER — HYDROCOD POLST-CHLORPHEN POLST 10-8 MG/5ML PO LQCR: 5.0000 mL | Freq: Two times a day (BID) | ORAL | Status: DC | PRN

## 2013-01-12 MED ORDER — FLUCONAZOLE 150 MG PO TABS: 150.0000 mg | ORAL_TABLET | Freq: Once | ORAL | Status: DC

## 2013-01-12 NOTE — Telephone Encounter (Signed)
[facility] Triage Call Report Triage Record Num: [id number] Operator: Sula Rumple Patient Name: Diane [name] Call Date & Time: 01/11/2013 5:18:11PM Patient Phone: [phone] PCP: Tera Mater. Clent Ridges Patient Gender: Female PCP Fax : [phone] Patient DOB: 15-Sep-1981 Practice Name: [facility] Reason for Call: Caller: [name]/Patient; PCP: Gershon Cr[facility]); CB#: [phone]; Call regarding Cough/Congestion; Pt calling on 01/11/13 states she had bronchitis 2 wks ago/on antibiotic/prednisone/pt states she improved but symptoms did not go away/ today she is worse again/ husband is also sick/ pt is afebrile/ is coughing/no energy/congestion/stuffy nose/iexpectorating green. Pharmacy [facility] [phone]/ LMP 4 wks ago due to financial concerns pt is asking to have something called in. per Dr Berenice Bouton not order any medication/advised Mucinex DM and pt to call and speak with PCP/all emergent sx ruled out per Cough Protocol Protocol(s) Used: Cough - Adult Recommended Outcome per Protocol: See Provider within 24 hours Reason for Outcome: Productive cough with colored sputum (other than clear or white sputum) Care Advice: ~ 02/25/

## 2013-01-12 NOTE — Telephone Encounter (Signed)
I sent in scripts and spoke with pt.

## 2013-01-12 NOTE — Telephone Encounter (Signed)
Per [name] okay to fill.

## 2013-01-12 NOTE — Telephone Encounter (Signed)
Please see CAN progress note from yesterday. Pt decline appt due to no Insurance . [facility]

## 2013-01-12 NOTE — Telephone Encounter (Signed)
I sent script e-scribe and spoke with pt. She is still coughing and would like a refill on the Tussionex and also a script for Diflucan, she did get a yeast infection after taking the last antibiotic.

## 2013-01-12 NOTE — Telephone Encounter (Signed)
Call in Ceftin 500 mg bid for 10 days  

## 2013-01-25 ENCOUNTER — Encounter (HOSPITAL_COMMUNITY)
Admission: RE | Admit: 2013-01-25 | Discharge: 2013-01-25 | Disposition: A | Discharge status: Home / Self Care | Payer: Self-pay | Source: Ambulatory Visit | Attending: Obstetrics and Gynecology | Admitting: Obstetrics and Gynecology

## 2013-01-25 DIAGNOSIS — O923 Agalactia: Secondary | ICD-10-CM | POA: Insufficient documentation

## 2013-02-26 ENCOUNTER — Encounter (HOSPITAL_COMMUNITY)
Admission: RE | Admit: 2013-02-26 | Discharge: 2013-02-26 | Disposition: A | Discharge status: Home / Self Care | Payer: Self-pay | Source: Ambulatory Visit | Attending: Obstetrics and Gynecology | Admitting: Obstetrics and Gynecology

## 2013-02-26 DIAGNOSIS — O923 Agalactia: Secondary | ICD-10-CM | POA: Insufficient documentation

## 2013-03-29 ENCOUNTER — Encounter (HOSPITAL_COMMUNITY)
Admission: RE | Admit: 2013-03-29 | Discharge: 2013-03-29 | Disposition: A | Discharge status: Home / Self Care | Payer: Self-pay | Source: Ambulatory Visit | Attending: Obstetrics and Gynecology | Admitting: Obstetrics and Gynecology

## 2013-03-29 DIAGNOSIS — O923 Agalactia: Secondary | ICD-10-CM | POA: Insufficient documentation

## 2013-04-01 DIAGNOSIS — G43709 Chronic migraine without aura, not intractable, without status migrainosus: Secondary | ICD-10-CM | POA: Insufficient documentation

## 2013-04-12 ENCOUNTER — Other Ambulatory Visit: Payer: Self-pay | Admitting: Obstetrics and Gynecology

## 2013-04-12 DIAGNOSIS — N63 Unspecified lump in unspecified breast: Secondary | ICD-10-CM

## 2013-04-25 ENCOUNTER — Other Ambulatory Visit: Payer: Self-pay | Admitting: *Deleted

## 2013-04-25 DIAGNOSIS — N6320 Unspecified lump in the left breast, unspecified quadrant: Secondary | ICD-10-CM

## 2013-04-26 ENCOUNTER — Ambulatory Visit (HOSPITAL_COMMUNITY): Payer: Self-pay

## 2013-04-29 ENCOUNTER — Encounter (HOSPITAL_COMMUNITY)
Admission: RE | Admit: 2013-04-29 | Discharge: 2013-04-29 | Disposition: A | Discharge status: Home / Self Care | Payer: Self-pay | Source: Ambulatory Visit | Attending: Obstetrics and Gynecology | Admitting: Obstetrics and Gynecology

## 2013-04-29 DIAGNOSIS — O923 Agalactia: Secondary | ICD-10-CM | POA: Insufficient documentation

## 2013-05-09 ENCOUNTER — Other Ambulatory Visit: Payer: Self-pay | Admitting: Obstetrics and Gynecology

## 2013-05-09 ENCOUNTER — Ambulatory Visit
Admission: RE | Admit: 2013-05-09 | Discharge: 2013-05-09 | Disposition: A | Discharge status: Home / Self Care | Payer: No Typology Code available for payment source | Source: Ambulatory Visit | Attending: Obstetrics and Gynecology | Admitting: Obstetrics and Gynecology

## 2013-05-09 DIAGNOSIS — N6320 Unspecified lump in the left breast, unspecified quadrant: Secondary | ICD-10-CM

## 2013-05-29 ENCOUNTER — Encounter (HOSPITAL_COMMUNITY)
Admission: RE | Admit: 2013-05-29 | Discharge: 2013-05-29 | Disposition: A | Discharge status: Home / Self Care | Payer: Self-pay | Source: Ambulatory Visit | Attending: Obstetrics and Gynecology | Admitting: Obstetrics and Gynecology

## 2013-05-29 DIAGNOSIS — O923 Agalactia: Secondary | ICD-10-CM | POA: Insufficient documentation

## 2013-06-29 ENCOUNTER — Encounter (HOSPITAL_COMMUNITY)
Admission: RE | Admit: 2013-06-29 | Discharge: 2013-06-29 | Disposition: A | Discharge status: Home / Self Care | Payer: Self-pay | Source: Ambulatory Visit | Attending: Obstetrics and Gynecology | Admitting: Obstetrics and Gynecology

## 2013-06-29 DIAGNOSIS — O923 Agalactia: Secondary | ICD-10-CM | POA: Insufficient documentation

## 2013-07-03 ENCOUNTER — Other Ambulatory Visit: Payer: Self-pay | Admitting: Family Medicine

## 2013-07-27 ENCOUNTER — Telehealth: Payer: Self-pay | Admitting: Family Medicine

## 2013-07-27 NOTE — Telephone Encounter (Signed)
Pt has constant stomach pain and diarrhea. Dr Renella Cunas has pt do cat scan and advised pt to follow up w/ pcp or GI doc, which is Dr Ky Barban. Pt would like to know if she needs referral to GI doc medolf, due to the fact she has not seen him since 2003. Also, pt would like to know if she should see you first. Pt requested Fri appt. Only same day appts. pls advise.

## 2013-07-27 NOTE — Telephone Encounter (Signed)
She would need an OV with me for this first. A Friday appt is okay

## 2013-07-28 NOTE — Telephone Encounter (Signed)
appt scheduled

## 2013-07-29 ENCOUNTER — Encounter: Payer: Self-pay | Admitting: Family Medicine

## 2013-07-29 ENCOUNTER — Ambulatory Visit (INDEPENDENT_AMBULATORY_CARE_PROVIDER_SITE_OTHER): Payer: BC Managed Care – PPO | Admitting: Family Medicine

## 2013-07-29 VITALS — BP 116/70 | HR 82 | Temp 98.5°F | Wt 198.0 lb

## 2013-07-29 DIAGNOSIS — R197 Diarrhea, unspecified: Secondary | ICD-10-CM

## 2013-07-29 DIAGNOSIS — R51 Headache: Secondary | ICD-10-CM

## 2013-07-29 MED ORDER — TIZANIDINE HCL 4 MG PO CAPS: 4.0000 mg | ORAL_CAPSULE | Freq: Three times a day (TID) | ORAL | Status: DC | PRN

## 2013-07-29 MED ORDER — TOPIRAMATE 100 MG PO TABS: 400.0000 mg | ORAL_TABLET | Freq: Every day | ORAL | Status: DC

## 2013-07-29 MED ORDER — GABAPENTIN 300 MG PO CAPS: 300.0000 mg | ORAL_CAPSULE | Freq: Three times a day (TID) | ORAL | Status: DC

## 2013-07-29 NOTE — Progress Notes (Signed)
  Subjective:    Patient ID: Diane [name], female    DOB: [birth year], 32 y.o.   MRN: [id number]  HPI Here asking for a referral to see Dr. Kinnie Scales for chronic diarrhea and abdominal pain. She has a hx of GERD and ulcers, and she is supposed to avoid taking any NSAID medications. However she has daily HAs and has no choice but to take [facility] daily for the pain. She sees Dr. Catalina Lunger for her chronic HAs, and is on a number of medications for this. She gets Botox injections every 3 months. She cannot take narcotic meds due to a hx of narcotic abuse. She does take Prevacid.   Review of Systems  Constitutional: Negative.   Gastrointestinal: Positive for abdominal pain and diarrhea. Negative for nausea, vomiting, constipation, blood in stool, abdominal distention and rectal pain.  Neurological: Positive for headaches.       Objective:   Physical Exam  Constitutional: She is oriented to person, place, and time. She appears well-developed and well-nourished. No distress.  Abdominal: Soft. Bowel sounds are normal. She exhibits no distension and no mass. There is no tenderness. There is no rebound and no guarding.  Neurological: She is alert and oriented to person, place, and time.          Assessment & Plan:  I cannot give her any better ideas about pain control today, although I did tell her to ask Dr. Catalina Lunger if she thought either Diclofenac gel or Lidoderm patches may be possible options for pain relief. Refer to Dr. Kinnie Scales

## 2013-07-30 ENCOUNTER — Encounter (HOSPITAL_COMMUNITY)
Admission: RE | Admit: 2013-07-30 | Discharge: 2013-07-30 | Disposition: A | Discharge status: Home / Self Care | Payer: BC Managed Care – PPO | Source: Ambulatory Visit | Attending: Obstetrics and Gynecology | Admitting: Obstetrics and Gynecology

## 2013-07-30 DIAGNOSIS — O923 Agalactia: Secondary | ICD-10-CM | POA: Insufficient documentation

## 2013-08-01 ENCOUNTER — Telehealth: Payer: Self-pay | Admitting: Family Medicine

## 2013-08-01 MED ORDER — LORAZEPAM 1 MG PO TABS: 1.0000 mg | ORAL_TABLET | Freq: Four times a day (QID) | ORAL | Status: DC | PRN
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
## 2013-08-01 NOTE — Telephone Encounter (Signed)
Call in Lorazepam 1 mg q 6 hours prn anxiety, #60 with no rf. Make an OV sometime soon for this

## 2013-08-01 NOTE — Telephone Encounter (Signed)
I called in script and spoke with pt. 

## 2013-08-03 ENCOUNTER — Other Ambulatory Visit: Payer: Self-pay | Admitting: Family Medicine

## 2013-08-03 ENCOUNTER — Ambulatory Visit: Payer: BC Managed Care – PPO | Admitting: Family Medicine

## 2013-08-12 ENCOUNTER — Telehealth: Payer: Self-pay | Admitting: Family Medicine

## 2013-08-12 MED ORDER — HYDROCODONE-HOMATROPINE 5-1.5 MG/5ML PO SYRP: 5.0000 mL | ORAL_SOLUTION | ORAL | Status: DC | PRN

## 2013-08-12 NOTE — Telephone Encounter (Signed)
Call in Hydromet 5 ml q 4 hours prn cough, 240 ml with no rf

## 2013-08-12 NOTE — Telephone Encounter (Signed)
Pt decline appt. Pt has a cough and chest congestion requesting cough med call [facility].  Pt is going out of town today for 9 days.

## 2013-08-12 NOTE — Telephone Encounter (Signed)
I called in script and spoke with pt. 

## 2013-08-22 ENCOUNTER — Telehealth: Payer: Self-pay | Admitting: Family Medicine

## 2013-08-22 NOTE — Telephone Encounter (Signed)
Refill request for Ferrex 150 Forte Plus Capsule, pt says she has been increased to 2 daily. [facility]?

## 2013-08-23 NOTE — Telephone Encounter (Signed)
Call in #60 with 11 rf 

## 2013-08-24 MED ORDER — FERREX 150 FORTE PLUS 50-100 MG PO CAPS: 150.0000 mg | ORAL_CAPSULE | Freq: Two times a day (BID) | ORAL | Status: DC

## 2013-08-24 NOTE — Telephone Encounter (Signed)
I sent script e-scribe. 

## 2013-08-25 ENCOUNTER — Telehealth: Payer: Self-pay | Admitting: Family Medicine

## 2013-08-25 NOTE — Telephone Encounter (Signed)
Pt has upper respiratory, cough, bad sore throat,sounds awlful. Needs appt fri afternoon. 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 Is it ok to sche?

## 2013-08-25 NOTE — Telephone Encounter (Signed)
yes

## 2013-08-25 NOTE — Telephone Encounter (Signed)
Done

## 2013-08-26 ENCOUNTER — Ambulatory Visit (INDEPENDENT_AMBULATORY_CARE_PROVIDER_SITE_OTHER): Payer: BC Managed Care – PPO | Admitting: Family Medicine

## 2013-08-26 ENCOUNTER — Encounter: Payer: Self-pay | Admitting: Family Medicine

## 2013-08-26 VITALS — BP 110/64 | HR 92 | Temp 98.3°F | Wt 199.0 lb

## 2013-08-26 DIAGNOSIS — J209 Acute bronchitis, unspecified: Secondary | ICD-10-CM

## 2013-08-26 MED ORDER — HYDROCOD POLST-CHLORPHEN POLST 10-8 MG/5ML PO LQCR: 5.0000 mL | Freq: Two times a day (BID) | ORAL | Status: DC | PRN

## 2013-08-26 MED ORDER — AZITHROMYCIN 250 MG PO TABS: ORAL_TABLET | ORAL | Status: DC

## 2013-08-26 NOTE — Progress Notes (Signed)
  Subjective:    Patient ID: Diane [name], female    DOB: 1981/07/28, 32 y.o.   MRN: [id number]  HPI Here for 4 days of PND, ST, and coughing up green sputum. No fever.    Review of Systems  Constitutional: Negative.   HENT: Positive for congestion and postnasal drip.   Eyes: Negative.   Respiratory: Positive for cough.        Objective:   Physical Exam  Constitutional: She appears well-developed and well-nourished.  HENT:  Right Ear: External ear normal.  Left Ear: External ear normal.  Nose: Nose normal.  Mouth/Throat: Oropharynx is clear and moist.  Eyes: Conjunctivae are normal.  Pulmonary/Chest: Effort normal. No respiratory distress. She has no wheezes. She has no rales.  Scattered rhonchi  Lymphadenopathy:    She has no cervical adenopathy.          Assessment & Plan:  Add Mucinex

## 2013-08-30 ENCOUNTER — Encounter (HOSPITAL_COMMUNITY)
Admission: RE | Admit: 2013-08-30 | Discharge: 2013-08-30 | Disposition: A | Discharge status: Home / Self Care | Payer: BC Managed Care – PPO | Source: Ambulatory Visit | Attending: Obstetrics and Gynecology | Admitting: Obstetrics and Gynecology

## 2013-08-30 DIAGNOSIS — O923 Agalactia: Secondary | ICD-10-CM | POA: Insufficient documentation

## 2013-09-06 ENCOUNTER — Telehealth: Payer: Self-pay | Admitting: *Deleted

## 2013-09-06 MED ORDER — HYDROCOD POLST-CHLORPHEN POLST 10-8 MG/5ML PO LQCR: 5.0000 mL | Freq: Two times a day (BID) | ORAL | Status: DC | PRN

## 2013-09-06 NOTE — Telephone Encounter (Signed)
Done

## 2013-09-06 NOTE — Telephone Encounter (Signed)
Pt would like a refill of chlorpheniramine-HYDROcodone (TUSSIONEX PENNKINETIC ER) 10-8 MG/5ML LQCR Pharm states she needs to pu rx.

## 2013-09-07 NOTE — Telephone Encounter (Signed)
Script is ready for pick up and I left a voice message.  

## 2013-09-08 ENCOUNTER — Telehealth: Payer: Self-pay | Admitting: Family Medicine

## 2013-09-08 NOTE — Telephone Encounter (Signed)
I spoke with pharmacy and they are going to fill script.  

## 2013-09-08 NOTE — Telephone Encounter (Signed)
Pt dropped off rx for tussionex and pharm said its to early to refill unless MD office call. [facility]

## 2013-09-08 NOTE — Telephone Encounter (Signed)
I spoke with pt  

## 2013-09-15 ENCOUNTER — Telehealth: Payer: Self-pay | Admitting: Family Medicine

## 2013-09-15 NOTE — Telephone Encounter (Signed)
We have a order it looks like from Dr. Kinnie Scales for labs. I left a voice message to confirm that pt does indeed need to have lab order put in computer and where is she going to have labs drawn at?

## 2013-09-16 NOTE — Telephone Encounter (Signed)
I spoke with pt and she has already had these labs drawn.

## 2013-09-20 ENCOUNTER — Encounter: Payer: Self-pay | Admitting: Family Medicine

## 2013-09-30 ENCOUNTER — Encounter (HOSPITAL_COMMUNITY)
Admission: RE | Admit: 2013-09-30 | Discharge: 2013-09-30 | Disposition: A | Discharge status: Home / Self Care | Payer: BC Managed Care – PPO | Source: Ambulatory Visit | Attending: Obstetrics and Gynecology | Admitting: Obstetrics and Gynecology

## 2013-09-30 DIAGNOSIS — O923 Agalactia: Secondary | ICD-10-CM | POA: Insufficient documentation

## 2013-10-31 ENCOUNTER — Other Ambulatory Visit (HOSPITAL_COMMUNITY): Payer: Self-pay | Admitting: Chiropractic Medicine

## 2013-10-31 ENCOUNTER — Encounter (HOSPITAL_COMMUNITY)
Admission: RE | Admit: 2013-10-31 | Discharge: 2013-10-31 | Disposition: A | Discharge status: Home / Self Care | Payer: BC Managed Care – PPO | Source: Ambulatory Visit | Attending: Obstetrics and Gynecology | Admitting: Obstetrics and Gynecology

## 2013-10-31 ENCOUNTER — Other Ambulatory Visit: Payer: Self-pay | Admitting: Family Medicine

## 2013-10-31 ENCOUNTER — Ambulatory Visit (HOSPITAL_COMMUNITY)
Admission: RE | Admit: 2013-10-31 | Discharge: 2013-10-31 | Disposition: A | Discharge status: Home / Self Care | Payer: BC Managed Care – PPO | Source: Ambulatory Visit | Attending: Chiropractic Medicine | Admitting: Chiropractic Medicine

## 2013-10-31 DIAGNOSIS — M545 Low back pain, unspecified: Secondary | ICD-10-CM | POA: Insufficient documentation

## 2013-10-31 DIAGNOSIS — O923 Agalactia: Secondary | ICD-10-CM | POA: Insufficient documentation

## 2013-10-31 DIAGNOSIS — W1789XA Other fall from one level to another, initial encounter: Secondary | ICD-10-CM | POA: Insufficient documentation

## 2013-10-31 DIAGNOSIS — Z975 Presence of (intrauterine) contraceptive device: Secondary | ICD-10-CM | POA: Insufficient documentation

## 2013-10-31 DIAGNOSIS — M4120 Other idiopathic scoliosis, site unspecified: Secondary | ICD-10-CM | POA: Insufficient documentation

## 2013-11-08 ENCOUNTER — Ambulatory Visit (INDEPENDENT_AMBULATORY_CARE_PROVIDER_SITE_OTHER): Payer: BC Managed Care – PPO | Admitting: Family Medicine

## 2013-11-08 ENCOUNTER — Telehealth: Payer: Self-pay | Admitting: Family Medicine

## 2013-11-08 VITALS — BP 90/62 | HR 97 | Temp 99.2°F | Wt 200.0 lb

## 2013-11-08 DIAGNOSIS — J069 Acute upper respiratory infection, unspecified: Secondary | ICD-10-CM

## 2013-11-08 MED ORDER — HYDROCOD POLST-CHLORPHEN POLST 10-8 MG/5ML PO LQCR: 5.0000 mL | Freq: Two times a day (BID) | ORAL | Status: DC | PRN

## 2013-11-08 NOTE — Patient Instructions (Signed)
INSTRUCTIONS FOR UPPER RESPIRATORY INFECTION:  -plenty of rest and fluids  -nasal saline wash 2-3 times daily (use prepackaged nasal saline or bottled/distilled water if making your own)   -clean nose with nasal saline before using the nasal steroid or sinex  -can use afrin or sinex nasal spray for drainage and nasal congestion - but do NOT use longer then 3-4 days  -can use tylenol or ibuprofen as directed for aches and sorethroat  -in the winter time, using a humidifier at night is helpful (please follow cleaning instructions)  -if you are taking a cough medication - use only as directed, may also try a teaspoon of honey to coat the throat and throat lozenges  -for sore throat, salt water gargles can help  -follow up if you have fevers, facial pain, tooth pain, difficulty breathing or are worsening or not getting better in 5-7 days  

## 2013-11-08 NOTE — Telephone Encounter (Signed)
FYI

## 2013-11-08 NOTE — Progress Notes (Signed)
Chief Complaint  Patient presents with  . Cough    congestion    HPI:  -started: 2 days ago -symptoms:nasal congestion, sore throat, cough -denies:fever, SOB, NVD, tooth pain, body aches -has tried: flonase -sick contacts/travel/risks: denies flu exposure or Ebola risks, rest of family with a cold -Hx of: allergies ROS: See pertinent positives and negatives per HPI.  Past Medical History  Diagnosis Date  . ADD (attention deficit disorder with hyperactivity)   . Depression   . IBS (irritable bowel syndrome)   . ZOXWRUEA(540.9)     Dr Annia Belt at  [facility]  . PONV (postoperative nausea and vomiting)   . Maternal anemia, with delivery 11/22/2011    Past Surgical History  Procedure Laterality Date  . Cesarean section    . Extraction of wisdom teeth     . Tonsillectomy    . Cesarean section  11/20/2011    Procedure: CESAREAN SECTION;  Surgeon: Lenoard Aden, MD;  Location: [facility];  Service: Gynecology;  Laterality: N/A;    Family History  Problem Relation Age of Onset  . Alcohol abuse      family hx  . Arthritis      family hx  . Melanoma      family hx  . Aneurysm      family hx  . Brain cancer Father     TUMOR  . Anesthesia problems Father   . Anesthesia problems Mother     History   Social History  . Marital Status: Married    Spouse Name: N/A    Number of Children: N/A  . Years of Education: N/A   Social History Main Topics  . Smoking status: Current Some Day Smoker    Last Attempt to Quit: 12/19/2007  . Smokeless tobacco: Never Used     Comment: occ  . Alcohol Use: Yes     Comment: once a month  . Drug Use: No     Comment: DO NOT PRESCRIBE PAIN MEDS OR ANTIANXIETY MEDS  . Sexual Activity: Yes   Other Topics Concern  . Not on file   Social History Narrative  . No narrative on file    Current outpatient prescriptions:azithromycin (ZITHROMAX) 250 MG tablet, As directed, Disp: 6 tablet, Rfl: 0;  b complex vitamins tablet, Take  2 tablets by mouth daily. , Disp: , Rfl: ;  chlorpheniramine-HYDROcodone (TUSSIONEX PENNKINETIC ER) 10-8 MG/5ML LQCR, Take 5 mLs by mouth every 12 (twelve) hours as needed (cough)., Disp: 240 mL, Rfl: 0;  dexlansoprazole (DEXILANT) 60 MG capsule, Take 60 mg by mouth 2 (two) times daily., Disp: , Rfl:  Fe-Succ Ac-C-Thre Ac-B12-FA (FERREX 150 FORTE PLUS) 50-100 MG CAPS, Take 150 mg by mouth 2 (two) times daily., Disp: 60 each, Rfl: 11;  fluticasone (FLONASE) 50 MCG/ACT nasal spray, USE 2 SPRAYS IN EACH NOSTRIL EVERY DAY, Disp: 16 g, Rfl: 0;  gabapentin (NEURONTIN) 300 MG capsule, Take 1 capsule (300 mg total) by mouth 3 (three) times daily., Disp: 90 capsule, Rfl: 0 IUD's (PARAGARD INTRAUTERINE COPPER) IUD IUD, 1 each by Intrauterine route once., Disp: , Rfl: ;  labetalol (NORMODYNE) 200 MG tablet, TAKE 1/2 TABLET BY MOUTH TWICE A DAY, Disp: 30 tablet, Rfl: 3;  loratadine (CLARITIN) 10 MG tablet, Take 10 mg by mouth daily as needed. For allergies , Disp: , Rfl: ;  LORazepam (ATIVAN) 1 MG tablet, Take 1 tablet (1 mg total) by mouth every 6 (six) hours as needed for anxiety., Disp: 60 tablet,  Rfl: 0 Magnesium 500 MG CAPS, Take 1,000 mg by mouth daily., Disp: , Rfl: ;  Probiotic Product (ALIGN PO), Take by mouth., Disp: , Rfl: ;  promethazine (PHENERGAN) 25 MG suppository, Place 1 suppository (25 mg total) rectally every 4 (four) hours as needed for nausea., Disp: 12 each, Rfl: 2;  promethazine (PHENERGAN) 25 MG tablet, Take 25 mg by mouth every 6 (six) hours as needed for nausea., Disp: , Rfl:  tiZANidine (ZANAFLEX) 4 MG capsule, Take 1 capsule (4 mg total) by mouth 3 (three) times daily as needed for muscle spasms., Disp: 3 capsule, Rfl: 0;  topiramate (TOPAMAX) 100 MG tablet, Take 4 tablets (400 mg total) by mouth daily., Disp: 4 tablet, Rfl: 0;  venlafaxine XR (EFFEXOR-XR) 150 MG 24 hr capsule, Take 150 mg by mouth daily. Take 3 capsules every morning, Disp: , Rfl:   EXAM:  Filed Vitals:   11/08/13 1603   BP: 90/62  Pulse: 97  Temp: 99.2 F (37.3 C)    Body mass index is 31.32 kg/(m^2).  GENERAL: vitals reviewed and listed above, alert, oriented, appears well hydrated and in no acute distress  HEENT: atraumatic, conjunttiva clear, no obvious abnormalities on inspection of external nose and ears, normal appearance of ear canals and TMs, clear nasal congestion, mild post oropharyngeal erythema with PND, no tonsillar edema or exudate, no sinus TTP  NECK: no obvious masses on inspection  LUNGS: clear to auscultation bilaterally, no wheezes, rales or rhonchi, good air movement  CV: HRRR, no peripheral edema  MS: moves all extremities without noticeable abnormality  PSYCH: pleasant and cooperative, no obvious depression or anxiety  ASSESSMENT AND PLAN:  Discussed the following assessment and plan:  Upper respiratory infection - Plan: chlorpheniramine-HYDROcodone (TUSSIONEX PENNKINETIC ER) 10-8 MG/5ML LQCR  -given HPI and exam findings today, a serious infection or illness is unlikely. We discussed potential etiologies, with VURI being most likely, and advised supportive care and monitoring. We discussed treatment side effects, likely course, antibiotic misuse, transmission, and signs of developing a serious illness. -of course, we advised to return or notify a doctor immediately if symptoms worsen or persist or new concerns arise.    Patient Instructions  INSTRUCTIONS FOR UPPER RESPIRATORY INFECTION:  -plenty of rest and fluids  -nasal saline wash 2-3 times daily (use prepackaged nasal saline or bottled/distilled water if making your own)   -clean nose with nasal saline before using the nasal steroid or sinex  -can use afrin or sinex nasal spray for drainage and nasal congestion - but do NOT use longer then 3-4 days  -can use tylenol or ibuprofen as directed for aches and sorethroat  -in the winter time, using a humidifier at night is helpful (please follow cleaning  instructions)  -if you are taking a cough medication - use only as directed, may also try a teaspoon of honey to coat the throat and throat lozenges  -for sore throat, salt water gargles can help  -follow up if you have fevers, facial pain, tooth pain, difficulty breathing or are worsening or not getting better in 5-7 days      Maalik Pinn R.

## 2013-11-08 NOTE — Telephone Encounter (Signed)
Patient Information:  Caller Name: Corrine  Phone: [phone]  Patient: Diane [name]  Gender: Female  DOB: Mar 23, 1981  Age: 32 Years  PCP: Gershon Crane [facility])  Pregnant: No  Office Follow Up:  Does the office need to follow up with this patient?: No  Instructions For The Office: N/A  RN Note:  Reports DM never helps with cough at night; asking for Tussinex and antibiotic without visit.  Informed Dr Clent Ridges is out of the office.  MD orders are must be seen for antibiotics. Agreed to appointment.   Symptoms  Reason For Call & Symptoms: Emergent call:  Chest congestion, frequent cough, head congestion,.  Larey Seat off ladder last week so unable to come into office.  Called to request antibiotic and Tussinex to be called in.  Reviewed Health History In EMR: Yes  Reviewed Medications In EMR: Yes  Reviewed Allergies In EMR: Yes  Reviewed Surgeries / Procedures: Yes  Date of Onset of Symptoms: 11/07/2013  Treatments Tried: Tylenol Cold  Treatments Tried Worked: No OB / GYN:  LMP: 11/07/2013  Guideline(s) Used:  Cough  Disposition Per Guideline:   Home Care  Reason For Disposition Reached:   Cough with cold symptoms (e.g., runny nose, postnasal drip, throat clearing)  Advice Given:  Reassurance  Coughing is the way that our lungs remove irritants and mucus. It helps protect our lungs from getting pneumonia.  You can get a dry hacking cough after a chest cold. Sometimes this type of cough can last 1-3 weeks, and be worse at night.  Cough Medicines:  OTC Cough Syrups: The most common cough suppressant in OTC cough medications is dextromethorphan. Often the letters "DM" appear in the name.  Cough Medicines:  OTC Cough Syrups: The most common cough suppressant in OTC cough medications is dextromethorphan. Often the letters "DM" appear in the name.  Home Remedy - Honey: This old home remedy has been shown to help decrease coughing at night. The adult dosage is 2 teaspoons (10  ml) at bedtime. Honey should not be given to infants under one year of age.  RN Overrode Recommendation:  Make Appointment  Agreed to be seen for Rx  Appointment Scheduled:  11/08/2013 15:45:00 Appointment Scheduled Provider:  [name] (TEXT 1st, after 20 mins can call), Dahlia Client [facility])

## 2013-11-11 ENCOUNTER — Telehealth: Payer: Self-pay | Admitting: Family Medicine

## 2013-11-11 NOTE — Telephone Encounter (Signed)
Pt was seen on 11/08/13 by Dr [name] and was told to call back if she was not feeling any better.  Told pt that Dr Clent Ridges would be in tomorrow running the Saturday clinic and would forward message to him for review.  Told pt if she did not hear back from anyone about mid morning to call back.  She is too weak to get out of the bed and go to urgent care.  Pt has not had flu shot.  Pt verbalized understanding and had no questions.

## 2013-11-11 NOTE — Telephone Encounter (Signed)
Patient Information:  Caller Name: Kajsa  Phone: [phone]  Patient: Diane [name]  Gender: Female  DOB: 06-11-1981  Age: 32 Years  PCP: Gershon Crane [facility])  Pregnant: No  Office Follow Up:  Does the office need to follow up with this patient?: Yes  Instructions For The Office: Pt asking for Tamiflu   Symptoms  Reason For Call & Symptoms: Pt calling with flu sx. Congestion, fever, body aches, chills. Pt seen on 11/09/13 for similar but no body aches or fever then. Please call in Tamiflu per pt's request if appropriate.  Reviewed Health History In EMR: Yes  Reviewed Medications In EMR: Yes  Reviewed Allergies In EMR: Yes  Reviewed Surgeries / Procedures: Yes  Date of Onset of Symptoms: 11/09/2013  Treatments Tried: Cough Syrup Rx., Advil 600mg  po Q 4 hrs.  Treatments Tried Worked: Yes  Any Fever: Yes  Fever Taken: Ear Thermometer  Fever Time Of Reading: 13:00:00  Fever Last Reading: 101 OB / GYN:  LMP: 11/07/2013  Guideline(s) Used:  Influenza - Seasonal  Disposition Per Guideline:   Discuss with PCP and Callback by Nurse Today  Reason For Disposition Reached:   Patient requests antiviral medicine for influenza and flu symptoms present < 48 hours  Advice Given:  N/A  Patient Will Follow Care Advice:  YES

## 2013-11-14 NOTE — Telephone Encounter (Signed)
She apparently did not call back. I hope she has seen a doctor by now

## 2013-11-23 ENCOUNTER — Telehealth: Payer: Self-pay | Admitting: Family Medicine

## 2013-11-23 NOTE — Telephone Encounter (Signed)
Patient Information:  Caller Name: [name]  Phone: [phone]  Patient: Diane [name], Diane [name]  Gender: Female  DOB: 01/13/1981  Age: 33 Years  PCP: [facility]ershon [facility]aneFry, [facility]  Pregnant: No  Office Follow Up:  Does the office need to follow up with this patient?: Yes  Instructions For The Office: Patient requests Rx Tussionex to be sent to her pharmacy.   Symptoms  Reason For Call & Symptoms: Patient reports she had flu like symptoms that turned into bronchitis.  She was seen at  Urgent Care on 11/17/13 for her cough and received Rx cough medication.  Frequent cough noted during the call. See Today in Office per Cough protocol due to Severe coughing spells (caller reports the coughing causes her to have headaches).  Reviewed Health History In EMR: Yes  Reviewed Medications In EMR: Yes  Reviewed Allergies In EMR: Yes  Reviewed Surgeries / Procedures: Yes  Date of Onset of Symptoms: 11/08/2013  Treatments Tried: Rx Tussionex with relief of cough  Treatments Tried Worked: Yes OB / GYN:  LMP: 11/08/2013  Guideline([name]) Used:  Cough  Disposition Per Guideline:   See Today in Office  Reason For Disposition Reached:   Severe coughing spells (e.g., whooping sound after coughing, vomiting after coughing)  Advice Given:  Reassurance  Coughing is the way that our lungs remove irritants and mucus. It helps protect our lungs from getting pneumonia.  Cough Medicines:  OTC Cough Drops: Cough drops can help a lot, especially for mild coughs. They reduce coughing by soothing your irritated throat and removing that tickle sensation in the back of the throat. Cough drops also have the advantage of portability - you can carry them with you.  Home Remedy - Hard Candy: Hard candy works just as well as medicine-flavored OTC cough drops. Diabetics should use sugar-free candy.  Home Remedy - Honey: This old home remedy has been shown to help decrease coughing at night. The adult dosage is 2 teaspoons  (10 ml) at bedtime. Honey should not be given to infants under one year of age.  Coughing Spasms:  Drink warm fluids. Inhale warm mist (Reason: both relax the airway and loosen up the phlegm).  Suck on cough drops or hard candy to coat the irritated throat.  Prevent Dehydration:  Drink adequate liquids.  This will help soothe an irritated or dry throat and loosen up the phlegm.  Avoid Tobacco Smoke:  Smoking or being exposed to smoke makes coughs much worse.  Call Back If:  Difficulty breathing  You become worse.  Patient Refused Recommendation:  Patient Requests Prescription  Caller states she has no babysitter and is not able to come for appointment on 11/23/13 or 11/24/13.   She relates that she has history of bronchitis that requires more than one Rx for Tussionex and requests same.

## 2013-11-23 NOTE — Telephone Encounter (Signed)
She would need an OV for this  

## 2013-11-24 NOTE — Telephone Encounter (Signed)
I spoke with pt and she is going to schedule a office visit.

## 2013-11-25 ENCOUNTER — Ambulatory Visit (INDEPENDENT_AMBULATORY_CARE_PROVIDER_SITE_OTHER): Payer: BC Managed Care – PPO | Admitting: Family Medicine

## 2013-11-25 ENCOUNTER — Encounter: Payer: Self-pay | Admitting: Family Medicine

## 2013-11-25 VITALS — BP 108/64 | HR 89 | Temp 99.5°F | Wt 203.0 lb

## 2013-11-25 DIAGNOSIS — J209 Acute bronchitis, unspecified: Secondary | ICD-10-CM

## 2013-11-25 DIAGNOSIS — J069 Acute upper respiratory infection, unspecified: Secondary | ICD-10-CM

## 2013-11-25 MED ORDER — LEVOFLOXACIN 500 MG PO TABS: 500.0000 mg | ORAL_TABLET | Freq: Every day | ORAL | Status: AC

## 2013-11-25 MED ORDER — HYDROCOD POLST-CHLORPHEN POLST 10-8 MG/5ML PO LQCR: 5.0000 mL | Freq: Two times a day (BID) | ORAL | Status: DC | PRN

## 2013-11-25 NOTE — Progress Notes (Signed)
Pre visit review using our clinic review tool, if applicable. No additional management support is needed unless otherwise documented below in the visit note. 

## 2013-11-25 NOTE — Progress Notes (Signed)
   Subjective:    Patient ID: Diane [name], female    DOB: 03/16/1981, 33 y.o.   MRN: [id number]  HPI Here for continued chest tightness and dry coughing. This started 2 weeks ago with sounds like influenza. She was seen here and given some cough meds but did not improve. She went to Urgent Care and was given Amoxicillin, but this has not helped.    Review of Systems  Constitutional: Negative.   HENT: Negative.   Eyes: Negative.   Respiratory: Positive for cough and chest tightness. Negative for wheezing.        Objective:   Physical Exam  Constitutional: She appears well-developed and well-nourished.  HENT:  Right Ear: External ear normal.  Left Ear: External ear normal.  Nose: Nose normal.  Mouth/Throat: Oropharynx is clear and moist.  Eyes: Conjunctivae are normal.  Pulmonary/Chest: Effort normal. No respiratory distress. She has no wheezes. She has no rales.  Scattered rhonchi   Lymphadenopathy:    She has no cervical adenopathy.          Assessment & Plan:  Add Mucinex

## 2013-12-01 ENCOUNTER — Encounter (HOSPITAL_COMMUNITY): Payer: BC Managed Care – PPO | Attending: Obstetrics and Gynecology

## 2013-12-01 DIAGNOSIS — O923 Agalactia: Secondary | ICD-10-CM | POA: Insufficient documentation

## 2013-12-04 ENCOUNTER — Other Ambulatory Visit: Payer: Self-pay | Admitting: Family Medicine

## 2013-12-06 ENCOUNTER — Encounter: Payer: Self-pay | Admitting: Family Medicine

## 2013-12-06 ENCOUNTER — Ambulatory Visit (INDEPENDENT_AMBULATORY_CARE_PROVIDER_SITE_OTHER): Payer: BC Managed Care – PPO | Admitting: Family Medicine

## 2013-12-06 VITALS — BP 110/68 | HR 95 | Temp 99.1°F | Ht 67.0 in | Wt 203.0 lb

## 2013-12-06 DIAGNOSIS — R0602 Shortness of breath: Secondary | ICD-10-CM

## 2013-12-06 DIAGNOSIS — J069 Acute upper respiratory infection, unspecified: Secondary | ICD-10-CM

## 2013-12-06 MED ORDER — FLUCONAZOLE 150 MG PO TABS: 150.0000 mg | ORAL_TABLET | Freq: Once | ORAL | Status: DC

## 2013-12-06 MED ORDER — HYDROCOD POLST-CHLORPHEN POLST 10-8 MG/5ML PO LQCR: 5.0000 mL | Freq: Two times a day (BID) | ORAL | Status: DC | PRN

## 2013-12-06 MED ORDER — DOXYCYCLINE HYCLATE 100 MG PO CAPS: 100.0000 mg | ORAL_CAPSULE | Freq: Two times a day (BID) | ORAL | Status: AC

## 2013-12-06 MED ORDER — CLARITHROMYCIN 500 MG PO TABS: 500.0000 mg | ORAL_TABLET | Freq: Two times a day (BID) | ORAL | Status: DC

## 2013-12-06 NOTE — Progress Notes (Signed)
Pre visit review using our clinic review tool, if applicable. No additional management support is needed unless otherwise documented below in the visit note. 

## 2013-12-06 NOTE — Progress Notes (Signed)
   Subjective:    Patient ID: Diane [name], female    DOB: 11/21/1980, 33 y.o.   MRN: [id number]  HPI Here for continued upper respiratory problems. This started about a month ago with a deep cough. She has taken Amoxicillin and then Levaquin with no improvement. She is still coughing and is very SOB, even with minimal exertion. No chest pain. She still has fevers.    Review of Systems  Constitutional: Positive for fever.  HENT: Negative.   Eyes: Negative.   Respiratory: Positive for cough, chest tightness and shortness of breath.   Cardiovascular: Negative.        Objective:   Physical Exam  Constitutional:  Appears ill, she is hoarse   HENT:  Right Ear: External ear normal.  Left Ear: External ear normal.  Nose: Nose normal.  Eyes: Conjunctivae are normal.  Pulmonary/Chest: Effort normal and breath sounds normal. No respiratory distress. She has no wheezes. She has no rales.  Lymphadenopathy:    She has no cervical adenopathy.          Assessment & Plan:  It is unclear as to why she still feels bad. She could have a patch of pneumonia, so we will start her on Biaxin and Doxycycline. I am concerned about a possible PE as well due to her SOB, so we will set up a chest CT tomorrow.

## 2013-12-07 ENCOUNTER — Ambulatory Visit: Payer: BC Managed Care – PPO | Admitting: Family Medicine

## 2013-12-07 ENCOUNTER — Telehealth: Payer: Self-pay | Admitting: Family Medicine

## 2013-12-07 NOTE — Telephone Encounter (Signed)
Pt has called stating that if dr fry could increase the dosage amount to every 8 hrs, pharm will fill.pt would like you to call her.

## 2013-12-07 NOTE — Telephone Encounter (Signed)
No we cannot dose this any more frequently. Since she is using so much of this, tell the pharmacy to not fill it at all. We are still waiting for the chest CT report. Based on that, I anticipate referring her to a specialist, either Pulmonary or Allergy.

## 2013-12-07 NOTE — Telephone Encounter (Signed)
Pharm states this refill chlorpheniramine-HYDROcodone (TUSSIONEX PENNKINETIC ER) 10-8 MG/5ML LQCR Is the second early refill for this med. This is the 3rd time since the 12/23 that pt would have this rx.Diane [name]. Pharm states he is not comfortable filling this med for pt. this would be a 72 day supply for pt.  Pharm also states this happened in oct that this med was filled early.  Pharm will not fill.

## 2013-12-08 NOTE — Telephone Encounter (Signed)
I spoke with pt, Dr. Clent RidgesFry is only recommends taking the cough syrup every 12 hours. The CT order is in the computer and pt should here something soon about the appointment for the scan.

## 2013-12-13 ENCOUNTER — Ambulatory Visit (INDEPENDENT_AMBULATORY_CARE_PROVIDER_SITE_OTHER)
Admission: RE | Admit: 2013-12-13 | Discharge: 2013-12-13 | Disposition: A | Discharge status: Home / Self Care | Payer: BC Managed Care – PPO | Source: Ambulatory Visit | Attending: Family Medicine | Admitting: Family Medicine

## 2013-12-13 DIAGNOSIS — R0602 Shortness of breath: Secondary | ICD-10-CM

## 2013-12-13 MED ORDER — IOHEXOL 350 MG/ML SOLN
80.0000 mL | Freq: Once | INTRAVENOUS | Status: AC | PRN
  Administered 2013-12-13: 80 mL via INTRAVENOUS

## 2013-12-14 ENCOUNTER — Institutional Professional Consult (permissible substitution): Payer: BC Managed Care – PPO | Admitting: Internal Medicine

## 2013-12-14 NOTE — Addendum Note (Signed)
Addended by: Gershon CraneFRY, STEPHEN A on: 12/14/2013 08:50 AM   Modules accepted: Orders

## 2013-12-15 ENCOUNTER — Ambulatory Visit (INDEPENDENT_AMBULATORY_CARE_PROVIDER_SITE_OTHER): Payer: BC Managed Care – PPO | Admitting: Pulmonary Disease

## 2013-12-15 ENCOUNTER — Encounter (INDEPENDENT_AMBULATORY_CARE_PROVIDER_SITE_OTHER): Payer: Self-pay

## 2013-12-15 ENCOUNTER — Other Ambulatory Visit (INDEPENDENT_AMBULATORY_CARE_PROVIDER_SITE_OTHER): Payer: BC Managed Care – PPO

## 2013-12-15 ENCOUNTER — Telehealth: Payer: Self-pay | Admitting: Pulmonary Disease

## 2013-12-15 ENCOUNTER — Encounter: Payer: Self-pay | Admitting: Pulmonary Disease

## 2013-12-15 VITALS — BP 102/78 | HR 88 | Temp 97.9°F | Ht 67.0 in | Wt 201.4 lb

## 2013-12-15 DIAGNOSIS — J111 Influenza due to unidentified influenza virus with other respiratory manifestations: Secondary | ICD-10-CM

## 2013-12-15 DIAGNOSIS — J90 Pleural effusion, not elsewhere classified: Secondary | ICD-10-CM

## 2013-12-15 HISTORY — DX: Pleural effusion, not elsewhere classified: J90

## 2013-12-15 LAB — RHEUMATOID FACTOR

## 2013-12-15 LAB — ANGIOTENSIN CONVERTING ENZYME: Angiotensin-Converting Enzyme: 31 U/L (ref 8–52)

## 2013-12-15 LAB — SEDIMENTATION RATE: Sed Rate: 28 mm/hr — ABNORMAL HIGH (ref 0–22)

## 2013-12-15 MED ORDER — PREDNISONE 10 MG PO TABS: ORAL_TABLET | ORAL | Status: DC

## 2013-12-15 MED ORDER — DEXLANSOPRAZOLE 60 MG PO CPDR: 60.0000 mg | DELAYED_RELEASE_CAPSULE | Freq: Two times a day (BID) | ORAL | Status: DC

## 2013-12-15 NOTE — Telephone Encounter (Signed)
I called and spoke with pt. She is requesting an rx for nicotrol inhaler. She is aware KC is out of the office until tomorrow. Please advise KC thanks

## 2013-12-15 NOTE — Assessment & Plan Note (Signed)
The patient's history is most consistent with probable influenza, complicated by a postinfectious bronchiolitis. She has persistent airway symptoms that are improved, but she is not returning to baseline. I think she would benefit from a trial of steroids, and will initiate this. I think her cough also has upper airway sources, including postnasal drip and reflux. She admits that her reflux has been fairly severe recently. I have asked her to increase her proton pump inhibitor to twice a day, with H2 blocker at bedtime. I've also discussed with her the behavioral therapies which can help cyclical coughing.

## 2013-12-15 NOTE — Assessment & Plan Note (Signed)
The patient has tiny bilateral pleural effusions and a pericardial effusion by recent chest CT. This may be all secondary to a viral pleuropericarditis, although I cannot exclude the possibility of an autoimmune process. This may have absolutely nothing to do with her acute symptoms. I will check some autoimmune blood work, and have reassured her this will resolve on its own if it is related to her viral infection. She will need a followup at some point to ensure this has improved.

## 2013-12-15 NOTE — Progress Notes (Signed)
   Subjective:    Patient ID: Diane [name], female    DOB: 07/17/1981, 33 y.o.   MRN: [id number]  HPI The patient is a 33 year old female who I've been asked to see for persistent cough and an abnormal CT chest. She was in her usual state of health with no breathing issues and told December 23 last year, when she developed body aches, temperature 203, as well as chills. She then developed cough with some chest tightness, and ultimately was treated with amoxicillin. She had no improvement in her symptoms, and began to develop some shortness of breath. She also developed laryngitis-like symptoms, and was treated with a no other antibiotic. She had not produced any mucus during this time, but most recently began to cough up scant slightly discolored mucus. She is now back on Biaxin and doxycycline, and has not been treated with a course of prednisone. She continues to have primarily a nonproductive cough with chest congestion and some shortness of breath. She is having bloody discharge from her nose, along with mild sinus pressure and congestion. She denies any postnasal drip, but does have severe reflux symptoms. She has no significant lower extremity edema. There is no history of childhood asthma, nor has she had spirometry. The patient has had a recent chest x-ray followed by a CT chest which showed very tiny bilateral pleural effusions, a small pericardial effusion, and the question was raised of right atrial enlargement.  She has no personal history of autoimmune disease, but her father does. Diagnosis of rheumatoid arthritis. It should also be noted the patient has a history of smoking, and continues to do so.   Review of Systems  Constitutional: Negative for fever and unexpected weight change.  HENT: Negative for congestion, dental problem, ear pain, nosebleeds, postnasal drip, rhinorrhea, sinus pressure, sneezing, sore throat and trouble swallowing.   Eyes: Negative for redness and itching.   Respiratory: Positive for cough, chest tightness, shortness of breath and wheezing.   Cardiovascular: Negative for palpitations and leg swelling.  Gastrointestinal: Negative for nausea and vomiting.  Genitourinary: Negative for dysuria.  Musculoskeletal: Negative for joint swelling.  Skin: Negative for rash.  Neurological: Positive for dizziness and syncope ( episodes). Negative for headaches.  Hematological: Does not bruise/bleed easily.  Psychiatric/Behavioral: Positive for dysphoric mood. The patient is nervous/anxious.        Objective:   Physical Exam Constitutional:  Obese female, no acute distress  HENT:  Nares patent but bloody crusted secretions right > left  Oropharynx without exudate, palate and uvula are normal  Eyes:  Perrla, eomi, no scleral icterus  Neck:  No JVD, no TMG  Cardiovascular:  Normal rate, regular rhythm, no rubs or gallops.  No murmurs        Intact distal pulses  Pulmonary :  Normal breath sounds, no stridor or respiratory distress   No rales, rhonchi, or wheezing  Abdominal:  Soft, nondistended, bowel sounds present.  No tenderness noted.   Musculoskeletal:  No lower extremity edema noted.  Lymph Nodes:  No cervical lymphadenopathy noted  Skin:  No cyanosis noted  Neurologic:  Alert, appropriate, moves all 4 extremities without obvious deficit.         Assessment & Plan:

## 2013-12-15 NOTE — Patient Instructions (Signed)
Will check bloodwork today for autoimmune disease, and will call you with results. Will treat with a course of prednisone Use neilmed sinus rinses am and pm for next 1-2 weeks to help sinus symptoms. Get zyrtec 10mg  otc, and take each night at bedtime. Increase dexilant to am and pm until next visit, and take zantac 150 or 300mg  at bedtime Try and avoid throat clearing, use hard candy to keep tickle minimized. Would like to see you back in 2-3 weeks to check on progress, but call if having worsening symptoms.

## 2013-12-16 LAB — ANA: Anti Nuclear Antibody(ANA): NEGATIVE

## 2013-12-16 MED ORDER — NICOTINE 10 MG IN INHA: RESPIRATORY_TRACT | Status: DC

## 2013-12-16 NOTE — Addendum Note (Signed)
Addended by: Maisie FusGREEN, ASHTYN M on: 12/16/2013 05:23 PM   Modules accepted: Orders

## 2013-12-16 NOTE — Telephone Encounter (Signed)
Called and spoke with pt aware of recs. RX has been called in. Nothing further needed

## 2013-12-16 NOTE — Telephone Encounter (Signed)
Ok to try nicotrol inhaler, just need to start at lower dose range.

## 2013-12-16 NOTE — Telephone Encounter (Addendum)
Spoke with pt--pharmacy did not get Nicotrol inhaler This has been called into [facility] per pt request

## 2013-12-18 ENCOUNTER — Telehealth: Payer: Self-pay | Admitting: Family Medicine

## 2013-12-18 NOTE — Telephone Encounter (Signed)
Relevant patient education mailed to patient.  

## 2013-12-26 ENCOUNTER — Telehealth: Payer: Self-pay | Admitting: Pulmonary Disease

## 2013-12-27 ENCOUNTER — Other Ambulatory Visit: Payer: Self-pay | Admitting: Pulmonary Disease

## 2013-12-27 DIAGNOSIS — R05 Cough: Secondary | ICD-10-CM

## 2013-12-27 DIAGNOSIS — J019 Acute sinusitis, unspecified: Secondary | ICD-10-CM

## 2013-12-27 DIAGNOSIS — R053 Chronic cough: Secondary | ICD-10-CM

## 2013-12-27 HISTORY — DX: Chronic cough: R05.3

## 2013-12-27 MED ORDER — HYDROCODONE-HOMATROPINE 5-1.5 MG/5ML PO SYRP: 5.0000 mL | ORAL_SOLUTION | Freq: Four times a day (QID) | ORAL | Status: DC | PRN

## 2013-12-27 NOTE — Telephone Encounter (Signed)
Part of the problem here is her ongoing smoking.  If she does not stop, this will take FOREVER to go away.  Since she has not responded well, will need to check an xray of her sinuses to see if this is all sinusitis.  I will send in ct request, and call her with results when available.   Ok to send in hydromet 5 cc q6hrs if needed.  4 ounces and NO FILLS.

## 2013-12-27 NOTE — Telephone Encounter (Signed)
lmtcb x1 

## 2013-12-27 NOTE — Telephone Encounter (Signed)
Pt called back. C/o a lot of cough-productive at times green mixed w/ ting of blood, PND, nasal congestion.denies any increase SOB, No wheeznig, no chest tx. She has finished her prednisone. She is doing neil med rinses but reports it makes her nasal congestion worse. She also states her son got into her cabinet and her tussionex cough syrup spilled down the sink drain. She is out of this now. Please advise KC  Thanks  Allergies  Allergen Reactions  . Metoclopramide Hcl Other (See Comments)    Anxiety/restlessness  . Nsaids Other (See Comments)    Pt does not take nsaids bc of a history of ulcers  . Prochlorperazine Edisylate Other (See Comments)    Anxiety/restlessness

## 2013-12-27 NOTE — Telephone Encounter (Signed)
Spoke with pt. Aware of KC recs. She reports she is using ecig now. Aware CT sinus has been ordered. RX for cough signed and palced for pick up. Nothing further needed

## 2014-01-02 ENCOUNTER — Inpatient Hospital Stay: Admission: RE | Admit: 2014-01-02 | Payer: BC Managed Care – PPO | Source: Ambulatory Visit

## 2014-01-05 ENCOUNTER — Other Ambulatory Visit: Payer: BC Managed Care – PPO

## 2014-01-09 ENCOUNTER — Ambulatory Visit (INDEPENDENT_AMBULATORY_CARE_PROVIDER_SITE_OTHER)
Admission: RE | Admit: 2014-01-09 | Discharge: 2014-01-09 | Disposition: A | Discharge status: Home / Self Care | Payer: BC Managed Care – PPO | Source: Ambulatory Visit | Attending: Pulmonary Disease | Admitting: Pulmonary Disease

## 2014-01-09 DIAGNOSIS — J019 Acute sinusitis, unspecified: Secondary | ICD-10-CM

## 2014-01-09 DIAGNOSIS — R05 Cough: Secondary | ICD-10-CM

## 2014-01-09 DIAGNOSIS — R059 Cough, unspecified: Secondary | ICD-10-CM

## 2014-01-10 ENCOUNTER — Ambulatory Visit: Payer: BC Managed Care – PPO | Admitting: Pulmonary Disease

## 2014-01-11 ENCOUNTER — Ambulatory Visit (INDEPENDENT_AMBULATORY_CARE_PROVIDER_SITE_OTHER)
Admission: RE | Admit: 2014-01-11 | Discharge: 2014-01-11 | Disposition: A | Discharge status: Home / Self Care | Payer: BC Managed Care – PPO | Source: Ambulatory Visit | Attending: Pulmonary Disease | Admitting: Pulmonary Disease

## 2014-01-11 ENCOUNTER — Ambulatory Visit (INDEPENDENT_AMBULATORY_CARE_PROVIDER_SITE_OTHER): Payer: BC Managed Care – PPO | Admitting: Pulmonary Disease

## 2014-01-11 ENCOUNTER — Encounter: Payer: Self-pay | Admitting: Pulmonary Disease

## 2014-01-11 VITALS — BP 108/70 | HR 89 | Temp 98.0°F | Ht 67.0 in | Wt 205.0 lb

## 2014-01-11 DIAGNOSIS — R059 Cough, unspecified: Secondary | ICD-10-CM

## 2014-01-11 DIAGNOSIS — R05 Cough: Secondary | ICD-10-CM

## 2014-01-11 DIAGNOSIS — J90 Pleural effusion, not elsewhere classified: Secondary | ICD-10-CM

## 2014-01-11 DIAGNOSIS — R053 Chronic cough: Secondary | ICD-10-CM

## 2014-01-11 MED ORDER — TRAMADOL HCL 50 MG PO TABS: 50.0000 mg | ORAL_TABLET | Freq: Four times a day (QID) | ORAL | Status: DC | PRN

## 2014-01-11 NOTE — Assessment & Plan Note (Signed)
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 I think we need to continue aggressive treatment of the various issues, and will also work on that her cough suppression with tramadol rather than narcotic cough syrups. I have told the patient this may be a slow process, and hope for resolution over time if she remains compliant with the recommendations.

## 2014-01-11 NOTE — Progress Notes (Signed)
   Subjective:    Patient ID: Diane [name], female    DOB: 01/03/1981, 33 y.o.   MRN: [id number]  HPI Patient comes in today for followup of her chronic cough. Last visit, she was felt to have a post viral bronchiolitis, and was treated with a course of prednisone. She was also felt to have cyclical coughing related to postnasal drip, reflux disease, and the irritable larynx syndrome. We increased her meds for reflux, and also discussed behavioral therapies to help with cyclical coughing. She is also had a scan of her sinuses that showed no evidence for acute or chronic sinusitis. She comes in today where her cough has mildly improved, but not by much. She continues to have coughing at night that is keeping her awake, along with hoarseness. Unfortunately, she is continuing to clear her throat, and have counseled her on this again. She is asking for more narcotic cough syrup. She feels that her reflux is better since adding Zantac at bedtime.   Review of Systems  Constitutional: Positive for fatigue. Negative for fever and unexpected weight change.  HENT: Positive for voice change. Negative for congestion, dental problem, ear pain, nosebleeds, postnasal drip, rhinorrhea, sinus pressure, sneezing, sore throat and trouble swallowing.   Eyes: Negative for redness and itching.  Respiratory: Positive for cough and shortness of breath. Negative for chest tightness and wheezing.   Cardiovascular: Negative for palpitations and leg swelling.  Gastrointestinal: Negative for nausea and vomiting.       ACID HEARTBURN/REFLUX  Genitourinary: Negative for dysuria.  Musculoskeletal: Negative for joint swelling.  Skin: Negative for rash.  Neurological: Positive for weakness. Negative for headaches.  Hematological: Does not bruise/bleed easily.  Psychiatric/Behavioral: Negative for dysphoric mood. The patient is not nervous/anxious.        Objective:   Physical Exam Obese female in no acute distress Nose  without purulence or discharge noted Oropharynx clear Neck without lymphadenopathy or thyromegaly Chest totally clear to auscultation, with no wheezing Cardiac exam with regular rate and rhythm Lower extremities without edema, no cyanosis Alert and oriented, moves all 4 extremities.       Assessment & Plan:

## 2014-01-11 NOTE — Patient Instructions (Signed)
Continue with dexilant am and pm, and zantac at bedtime Would take zyrtec d in the am, but would not take at night.  Instead, take chlorpheniramine 4mg ,  2 tablets after dinner.   Make sure you are sleeping upright  No throat clearing, keep using hard candy, minimize voice use. Can use tramadol 50mg  up to every 6 hrs if needed for cough. Will check chest xray today to make sure fluid has resolved on lungs.  Please call me in 2 weeks with how things are going.  I suspect this is going to be a slow process

## 2014-01-13 ENCOUNTER — Telehealth: Payer: Self-pay | Admitting: Pulmonary Disease

## 2014-01-13 NOTE — Telephone Encounter (Signed)
RX for tramadol looks like it was printed on 01/11/14.  [facility]. I was advised no RX was ever brought into them i called and spoke with pt. She was never given RX i called and gave VO to [facility]. Nothing further needed

## 2014-01-17 ENCOUNTER — Telehealth: Payer: Self-pay | Admitting: Pulmonary Disease

## 2014-01-17 MED ORDER — DEXLANSOPRAZOLE 60 MG PO CPDR: 60.0000 mg | DELAYED_RELEASE_CAPSULE | Freq: Two times a day (BID) | ORAL | Status: DC

## 2014-01-17 NOTE — Telephone Encounter (Signed)
Rx has been sent to [facility].

## 2014-01-20 ENCOUNTER — Telehealth: Payer: Self-pay | Admitting: Pulmonary Disease

## 2014-01-20 MED ORDER — DEXLANSOPRAZOLE 60 MG PO CPDR: 60.0000 mg | DELAYED_RELEASE_CAPSULE | Freq: Two times a day (BID) | ORAL | Status: DC

## 2014-01-20 NOTE — Telephone Encounter (Signed)
Pt needed her RX for dexilant sent for #60 and not #30. i advised will send this in. Nothing further needed

## 2014-01-23 ENCOUNTER — Telehealth: Payer: Self-pay | Admitting: Pulmonary Disease

## 2014-01-23 NOTE — Telephone Encounter (Signed)
Called spoke with pt. She reports her reflux is under control. She reports this is coming from her chest. She has seen a GI doc in the past but again she stated she knows this is not from her reflux. She reports she has "not used' in over 5 years and have been giving tussionex to help with bronchitis.  Please advise KC thanks  Allergies  Allergen Reactions  . Metoclopramide Hcl Other (See Comments)    Anxiety/restlessness  . Nsaids Other (See Comments)    Pt does not take nsaids bc of a history of ulcers  . Prochlorperazine Edisylate Other (See Comments)    Anxiety/restlessness

## 2014-01-23 NOTE — Telephone Encounter (Signed)
I am not going to prescribe a narcotic, and do think this is coming from her chest.  Options are for me to refer to GI, or can have an OV.

## 2014-01-23 NOTE — Telephone Encounter (Signed)
Spoke with pt - She states that cough is still not controlled on Tramadol every 6 hours - Only getting 4 hrs sleep per night - Hoarseness - SOB about the same - Some wheezing - Has tried Tussionex in the past and it helped - Pt wanting to know if she can try this - Please advise Allergies  Allergen Reactions  . Metoclopramide Hcl Other (See Comments)    Anxiety/restlessness  . Nsaids Other (See Comments)    Pt does not take nsaids bc of a history of ulcers  . Prochlorperazine Edisylate Other (See Comments)    Anxiety/restlessness
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
## 2014-01-23 NOTE — Telephone Encounter (Signed)
Spoke with pt. appt scheduled with TP tomorrow afternoon. Nothing further needed

## 2014-01-24 ENCOUNTER — Encounter: Payer: Self-pay | Admitting: Adult Health

## 2014-01-24 ENCOUNTER — Ambulatory Visit (INDEPENDENT_AMBULATORY_CARE_PROVIDER_SITE_OTHER): Payer: BC Managed Care – PPO | Admitting: Adult Health

## 2014-01-24 VITALS — BP 102/72 | HR 88 | Ht 67.0 in | Wt 206.2 lb

## 2014-01-24 DIAGNOSIS — R059 Cough, unspecified: Secondary | ICD-10-CM

## 2014-01-24 DIAGNOSIS — R053 Chronic cough: Secondary | ICD-10-CM

## 2014-01-24 DIAGNOSIS — R05 Cough: Secondary | ICD-10-CM

## 2014-01-24 MED ORDER — TRAMADOL HCL 50 MG PO TABS: 50.0000 mg | ORAL_TABLET | Freq: Four times a day (QID) | ORAL | Status: DC | PRN

## 2014-01-24 MED ORDER — PREDNISONE 10 MG PO TABS: ORAL_TABLET | ORAL | Status: DC

## 2014-01-24 MED ORDER — BENZONATATE 200 MG PO CAPS: 200.0000 mg | ORAL_CAPSULE | Freq: Three times a day (TID) | ORAL | Status: DC | PRN

## 2014-01-24 NOTE — Assessment & Plan Note (Signed)
Continue with dexilant am and pm, and zantac at bedtime Would take zyrtec d in the am, but would not take at night.  Instead, take chlorpheniramine 4mg ,  2 tablets after dinner.   No throat clearing, keep using hard candy, minimize voice use. (no mint products)  Begin Delsym 2 tsp Twice daily  For cough  Begin Tessalon 200mg  Three times a day  For cough.  Can use tramadol 50mg  1-2  every 6 hrs if needed for cough. Prednisone taper over next week.  Please contact office for sooner follow up if symptoms do not improve or worsen or seek emergency care  follow up Dr. Shelle Ironlance in 4 weeks .

## 2014-01-24 NOTE — Progress Notes (Signed)
Ov reviewed and agree with plan as outlined.  

## 2014-01-24 NOTE — Patient Instructions (Signed)
Continue with dexilant am and pm, and zantac at bedtime Would take zyrtec d in the am, but would not take at night.  Instead, take chlorpheniramine 4mg,  2 tablets after dinner.   No throat clearing, keep using hard candy, minimize voice use. (no mint products)  Begin Delsym 2 tsp Twice daily  For cough  Begin Tessalon 200mg Three times a day  For cough.  Can use tramadol 50mg 1-2  every 6 hrs if needed for cough. Prednisone taper over next week.  Please contact office for sooner follow up if symptoms do not improve or worsen or seek emergency care  follow up Dr. Clance in 4 weeks .  

## 2014-01-24 NOTE — Progress Notes (Signed)
Subjective:    Patient ID: Diane [name], female    DOB: 11/13/1981, 33 y.o.   MRN: [id number]  HPI The patient is a 33 year old female who I've been asked to see for persistent cough and an abnormal CT chest.   12/15/13 IOV  She was in her usual state of health with no breathing issues and told December 23 last year, when she developed body aches, temperature  103, as well as chills. She then developed cough with some chest tightness, and ultimately was treated with amoxicillin. She had no improvement in her symptoms, and began to develop some shortness of breath. She also developed laryngitis-like symptoms, and was treated with a no other antibiotic. She had not produced any mucus during this time, but most recently began to cough up scant slightly discolored mucus. She is now back on Biaxin and doxycycline, and has not been treated with a course of prednisone. She continues to have primarily a nonproductive cough with chest congestion and some shortness of breath. She is having bloody discharge from her nose, along with mild sinus pressure and congestion. She denies any postnasal drip, but does have severe reflux symptoms. She has no significant lower extremity edema. There is no history of childhood asthma, nor has she had spirometry. The patient has had a recent chest x-ray followed by a CT chest which showed very tiny bilateral pleural effusions, a small pericardial effusion, and the question was raised of right atrial enlargement.  She has no personal history of autoimmune disease, but her father does. Diagnosis of rheumatoid arthritis. It should also be noted the patient has a history of smoking, and continues to do so. >>cyclical cough w/ tramadol   01/24/2014 Acute OV  Complains of producitve cough with "cloudy" white mucous, becoming hoarse x 2 months.  Became dizzy with near syncope this morning with coughing fit.  Has been treated for cyclical cough with tramadol. Patient says it only  helps for short periods have and then cough returned. Patient underwent and autoimmune workup has been unrevealing. Chest x-ray last month showed a resolved pleural effusions. No fever , hemoptysis, chest pain or orthopnea or edema.    Review of Systems Constitutional:   No  weight loss, night sweats,  Fevers, chills, fatigue, or  lassitude.  HEENT:   No headaches,  Difficulty swallowing,  Tooth/dental problems, or  Sore throat,                No sneezing, itching, ear ache,  ++nasal congestion, post nasal drip,   CV:  No chest pain,  Orthopnea, PND, swelling in lower extremities, anasarca, dizziness, palpitations, syncope.   GI  No heartburn, indigestion, abdominal pain, nausea, vomiting, diarrhea, change in bowel habits, loss of appetite, bloody stools.   Resp:    No chest wall deformity  Skin: no rash or lesions.  GU: no dysuria, change in color of urine, no urgency or frequency.  No flank pain, no hematuria   MS:  No joint pain or swelling.  No decreased range of motion.  No back pain.  Psych:  No change in mood or affect. No depression or anxiety.  No memory loss.         Objective:   Physical Exam GEN: A/Ox3; pleasant , NAD, well nourished -hoarse   HEENT:  [place]/AT,  EACs-clear, TMs-wnl, NOSE-clear, THROAT-clear, no lesions, no postnasal drip or exudate noted.   NECK:  Supple w/ fair ROM; no JVD; normal carotid impulses w/o bruits; no thyromegaly  or nodules palpated; no lymphadenopathy.  RESP  Clear  P & A; w/o, wheezes/ rales/ or rhonchi.no accessory muscle use, no dullness to percussion  CARD:  RRR, no m/r/g  , no peripheral edema, pulses intact, no cyanosis or clubbing.  GI:   Soft & nt; nml bowel sounds; no organomegaly or masses detected.  Musco: Warm bil, no deformities or joint swelling noted.   Neuro: alert, no focal deficits noted.    Skin: Warm, no lesions or rashes         Assessment & Plan:

## 2014-02-06 ENCOUNTER — Other Ambulatory Visit: Payer: Self-pay | Admitting: Family Medicine

## 2014-02-13 ENCOUNTER — Ambulatory Visit: Payer: BC Managed Care – PPO | Admitting: Pulmonary Disease

## 2014-03-31 ENCOUNTER — Ambulatory Visit (INDEPENDENT_AMBULATORY_CARE_PROVIDER_SITE_OTHER): Payer: BC Managed Care – PPO | Admitting: Family Medicine

## 2014-03-31 ENCOUNTER — Encounter: Payer: Self-pay | Admitting: Family Medicine

## 2014-03-31 VITALS — BP 100/74 | HR 87 | Temp 98.9°F | Ht 67.0 in | Wt 196.0 lb

## 2014-03-31 DIAGNOSIS — J40 Bronchitis, not specified as acute or chronic: Secondary | ICD-10-CM

## 2014-03-31 DIAGNOSIS — R5381 Other malaise: Secondary | ICD-10-CM

## 2014-03-31 DIAGNOSIS — R5383 Other fatigue: Secondary | ICD-10-CM

## 2014-03-31 LAB — BASIC METABOLIC PANEL
BUN: 16 mg/dL (ref 6–23)
CALCIUM: 8.7 mg/dL (ref 8.4–10.5)
CHLORIDE: 110 meq/L (ref 96–112)
CO2: 21 meq/L (ref 19–32)
Creatinine, Ser: 0.9 mg/dL (ref 0.4–1.2)
GFR: 82.59 mL/min (ref >60.00)
GLUCOSE: 90 mg/dL (ref 70–99)
Potassium: 3.7 mEq/L (ref 3.5–5.1)
SODIUM: 139 meq/L (ref 135–145)

## 2014-03-31 LAB — CBC WITH DIFFERENTIAL/PLATELET
BASOS PCT: 0.8 % (ref 0.0–3.0)
Basophils Absolute: 0.1 10*3/uL (ref 0.0–0.1)
EOS ABS: 1.2 10*3/uL — AB (ref 0.0–0.7)
HCT: 34.9 % — ABNORMAL LOW (ref 36.0–46.0)
Hemoglobin: 11.7 g/dL — ABNORMAL LOW (ref 12.0–15.0)
LYMPHS ABS: 2.1 10*3/uL (ref 0.7–4.0)
Lymphocytes Relative: 30.1 % (ref 12.0–46.0)
MCHC: 33.5 g/dL (ref 30.0–36.0)
MCV: 89.8 fl (ref 78.0–100.0)
Monocytes Absolute: 0.5 10*3/uL (ref 0.1–1.0)
Monocytes Relative: 6.6 % (ref 3.0–12.0)
NEUTROS PCT: 46.2 % (ref 43.0–77.0)
Neutro Abs: 3.3 10*3/uL (ref 1.4–7.7)
Platelets: 265 10*3/uL (ref 150.0–400.0)
RBC: 3.89 Mil/uL (ref 3.87–5.11)
RDW: 13.1 % (ref 11.5–15.5)
WBC: 7.1 10*3/uL (ref 4.0–10.5)

## 2014-03-31 LAB — TSH: TSH: 0.3 u[IU]/mL — ABNORMAL LOW (ref 0.35–4.50)

## 2014-03-31 MED ORDER — AMOXICILLIN-POT CLAVULANATE 875-125 MG PO TABS: 1.0000 | ORAL_TABLET | Freq: Two times a day (BID) | ORAL | Status: DC

## 2014-03-31 MED ORDER — HYDROCODONE-HOMATROPINE 5-1.5 MG/5ML PO SYRP: 5.0000 mL | ORAL_SOLUTION | ORAL | Status: DC | PRN

## 2014-03-31 MED ORDER — METHYLPREDNISOLONE 4 MG PO KIT: PACK | ORAL | Status: AC

## 2014-03-31 NOTE — Progress Notes (Signed)
   Subjective:    Patient ID: Diane [name], female    DOB: 03/23/1981, 33 y.o.   MRN: [id number]  HPI Here for 10 days of low grade fevers, chest tightness, PND, and coughing up green sputum. She saw Urgent Care last Saturday and was given a Zpack. This did not help.    Review of Systems  Constitutional: Positive for fever and fatigue.  HENT: Positive for congestion and postnasal drip.   Eyes: Negative.   Respiratory: Positive for cough.        Objective:   Physical Exam  Constitutional: She appears well-developed and well-nourished. No distress.  HENT:  Right Ear: External ear normal.  Left Ear: External ear normal.  Nose: Nose normal.  Mouth/Throat: Oropharynx is clear and moist.  Eyes: Conjunctivae are normal.  Pulmonary/Chest: Effort normal. No respiratory distress. She has no wheezes. She has no rales.  Scattered rhonchi   Lymphadenopathy:    She has no cervical adenopathy.          Assessment & Plan:  Recheck prn.

## 2014-03-31 NOTE — Progress Notes (Signed)
Pre visit review using our clinic review tool, if applicable. No additional management support is needed unless otherwise documented below in the visit note. 

## 2014-04-04 ENCOUNTER — Telehealth: Payer: Self-pay | Admitting: Family Medicine

## 2014-04-04 NOTE — Telephone Encounter (Signed)
[facility] [place] - [address] is requesting re-fill on labetalol (NORMODYNE) 200 MG tablet ° °

## 2014-04-05 MED ORDER — LABETALOL HCL 200 MG PO TABS: ORAL_TABLET | ORAL | Status: DC

## 2014-04-05 NOTE — Telephone Encounter (Signed)
I sent script e-scribe. 

## 2014-04-12 ENCOUNTER — Telehealth: Payer: Self-pay | Admitting: Family Medicine

## 2014-04-12 NOTE — Telephone Encounter (Signed)
Pt said cough is better requesting a new rx for hydromet

## 2014-04-12 NOTE — Telephone Encounter (Signed)
I spoke with pt  

## 2014-04-12 NOTE — Telephone Encounter (Signed)
NO she should not use any more narcotic cough meds at this time. Follow up with Pulmonary if needed

## 2014-06-05 ENCOUNTER — Ambulatory Visit (INDEPENDENT_AMBULATORY_CARE_PROVIDER_SITE_OTHER): Payer: 59 | Admitting: Family Medicine

## 2014-06-05 ENCOUNTER — Encounter: Payer: Self-pay | Admitting: Family Medicine

## 2014-06-05 VITALS — BP 110/66 | HR 99 | Temp 99.5°F | Ht 67.0 in | Wt 189.0 lb

## 2014-06-05 DIAGNOSIS — J209 Acute bronchitis, unspecified: Secondary | ICD-10-CM

## 2014-06-05 MED ORDER — HYDROCODONE-HOMATROPINE 5-1.5 MG/5ML PO SYRP: 5.0000 mL | ORAL_SOLUTION | ORAL | Status: DC | PRN

## 2014-06-05 MED ORDER — AMOXICILLIN-POT CLAVULANATE 875-125 MG PO TABS: 1.0000 | ORAL_TABLET | Freq: Two times a day (BID) | ORAL | Status: DC

## 2014-06-05 MED ORDER — FLUCONAZOLE 150 MG PO TABS: 150.0000 mg | ORAL_TABLET | Freq: Once | ORAL | Status: DC

## 2014-06-05 MED ORDER — ALBUTEROL SULFATE HFA 108 (90 BASE) MCG/ACT IN AERS: 2.0000 | INHALATION_SPRAY | RESPIRATORY_TRACT | Status: DC | PRN

## 2014-06-05 MED ORDER — METHYLPREDNISOLONE ACETATE 40 MG/ML IJ SUSP
120.0000 mg | Freq: Once | INTRAMUSCULAR | Status: AC
  Administered 2014-06-05: 120 mg via INTRAMUSCULAR

## 2014-06-05 NOTE — Progress Notes (Signed)
   Subjective:    Patient ID: Diane [name], female    DOB: 03/13/1981, 33 y.o.   MRN: [id number]  HPI Here for 3 days of fever, wheezing and coughing up green sputum. She had done well with no coughing for several months, but then her child got a cold and she was exposed.    Review of Systems  Constitutional: Positive for fever.  HENT: Negative.   Eyes: Negative.   Respiratory: Positive for cough, chest tightness, shortness of breath and wheezing.        Objective:   Physical Exam  Constitutional: She appears well-developed and well-nourished.  Cardiovascular: Normal rate, regular rhythm, normal heart sounds and intact distal pulses.   Pulmonary/Chest: Effort normal. No respiratory distress. She has no rales.  Scattered wheezes and rhonchi           Assessment & Plan:  Given a steroid shot and Augmentin. Use albuterol prn.

## 2014-06-05 NOTE — Progress Notes (Signed)
Pre visit review using our clinic review tool, if applicable. No additional management support is needed unless otherwise documented below in the visit note. 

## 2014-06-05 NOTE — Addendum Note (Signed)
Addended by: Aniceto BossNIMMONS, SYLVIA A on: 06/05/2014 11:59 AM   Modules accepted: Orders

## 2014-06-08 ENCOUNTER — Telehealth: Payer: Self-pay | Admitting: *Deleted

## 2014-06-08 NOTE — Telephone Encounter (Signed)
Form for the dexilant PA has been filled out and faxed.  Will forward to jennifer to follow up on.

## 2014-06-11 ENCOUNTER — Encounter (HOSPITAL_COMMUNITY): Payer: 59 | Admitting: Anesthesiology

## 2014-06-11 ENCOUNTER — Encounter (HOSPITAL_BASED_OUTPATIENT_CLINIC_OR_DEPARTMENT_OTHER): Payer: Self-pay | Admitting: Emergency Medicine

## 2014-06-11 ENCOUNTER — Encounter (HOSPITAL_COMMUNITY)
Admission: EM | Disposition: A | Discharge status: Home / Self Care | Payer: Self-pay | Source: Home / Self Care | Attending: Emergency Medicine

## 2014-06-11 ENCOUNTER — Emergency Department (HOSPITAL_BASED_OUTPATIENT_CLINIC_OR_DEPARTMENT_OTHER): Payer: 59

## 2014-06-11 ENCOUNTER — Observation Stay (HOSPITAL_BASED_OUTPATIENT_CLINIC_OR_DEPARTMENT_OTHER)
Admission: EM | Admit: 2014-06-11 | Discharge: 2014-06-13 | Disposition: A | Discharge status: Home / Self Care | Payer: 59 | Attending: Surgery | Admitting: Surgery

## 2014-06-11 ENCOUNTER — Emergency Department (HOSPITAL_COMMUNITY): Payer: 59 | Admitting: Anesthesiology

## 2014-06-11 DIAGNOSIS — F17200 Nicotine dependence, unspecified, uncomplicated: Secondary | ICD-10-CM | POA: Insufficient documentation

## 2014-06-11 DIAGNOSIS — F329 Major depressive disorder, single episode, unspecified: Secondary | ICD-10-CM | POA: Insufficient documentation

## 2014-06-11 DIAGNOSIS — K3533 Acute appendicitis with perforation and localized peritonitis, with abscess: Secondary | ICD-10-CM | POA: Diagnosis present

## 2014-06-11 DIAGNOSIS — R1031 Right lower quadrant pain: Secondary | ICD-10-CM

## 2014-06-11 DIAGNOSIS — R110 Nausea: Secondary | ICD-10-CM

## 2014-06-11 DIAGNOSIS — K219 Gastro-esophageal reflux disease without esophagitis: Secondary | ICD-10-CM | POA: Insufficient documentation

## 2014-06-11 DIAGNOSIS — E669 Obesity, unspecified: Secondary | ICD-10-CM | POA: Insufficient documentation

## 2014-06-11 DIAGNOSIS — D649 Anemia, unspecified: Secondary | ICD-10-CM | POA: Insufficient documentation

## 2014-06-11 DIAGNOSIS — F3289 Other specified depressive episodes: Secondary | ICD-10-CM | POA: Insufficient documentation

## 2014-06-11 DIAGNOSIS — K3580 Unspecified acute appendicitis: Secondary | ICD-10-CM | POA: Diagnosis not present

## 2014-06-11 DIAGNOSIS — F411 Generalized anxiety disorder: Secondary | ICD-10-CM | POA: Insufficient documentation

## 2014-06-11 DIAGNOSIS — K353 Acute appendicitis with localized peritonitis, without perforation or gangrene: Secondary | ICD-10-CM

## 2014-06-11 HISTORY — PX: LAPAROSCOPIC APPENDECTOMY: SHX408

## 2014-06-11 LAB — CBC WITH DIFFERENTIAL/PLATELET
BASOS PCT: 0 % (ref 0–1)
Basophils Absolute: 0 10*3/uL (ref 0.0–0.1)
EOS ABS: 0.6 10*3/uL (ref 0.0–0.7)
Eosinophils Relative: 5 % (ref 0–5)
HCT: 35.1 % — ABNORMAL LOW (ref 36.0–46.0)
HEMOGLOBIN: 11.5 g/dL — AB (ref 12.0–15.0)
Lymphocytes Relative: 17 % (ref 12–46)
Lymphs Abs: 2.2 10*3/uL (ref 0.7–4.0)
MCH: 30.2 pg (ref 26.0–34.0)
MCHC: 32.8 g/dL (ref 30.0–36.0)
MCV: 92.1 fL (ref 78.0–100.0)
Monocytes Absolute: 1.2 10*3/uL — ABNORMAL HIGH (ref 0.1–1.0)
Monocytes Relative: 9 % (ref 3–12)
NEUTROS PCT: 69 % (ref 43–77)
Neutro Abs: 9.2 10*3/uL — ABNORMAL HIGH (ref 1.7–7.7)
Platelets: 264 10*3/uL (ref 150–400)
RBC: 3.81 MIL/uL — ABNORMAL LOW (ref 3.87–5.11)
RDW: 13.1 % (ref 11.5–15.5)
WBC: 13.3 10*3/uL — ABNORMAL HIGH (ref 4.0–10.5)

## 2014-06-11 LAB — COMPREHENSIVE METABOLIC PANEL
ALK PHOS: 91 U/L (ref 39–117)
ALT: 22 U/L (ref 0–35)
AST: 24 U/L (ref 0–37)
Albumin: 4.2 g/dL (ref 3.5–5.2)
Anion gap: 13 (ref 5–15)
BUN: 14 mg/dL (ref 6–23)
CO2: 20 meq/L (ref 19–32)
Calcium: 9.2 mg/dL (ref 8.4–10.5)
Chloride: 106 mEq/L (ref 96–112)
Creatinine, Ser: 0.7 mg/dL (ref 0.50–1.10)
GFR calc Af Amer: >90 mL/min (ref >90)
Glucose, Bld: 96 mg/dL (ref 70–99)
POTASSIUM: 4 meq/L (ref 3.7–5.3)
SODIUM: 139 meq/L (ref 137–147)
Total Bilirubin: 0.3 mg/dL (ref 0.3–1.2)
Total Protein: 7.5 g/dL (ref 6.0–8.3)

## 2014-06-11 LAB — URINALYSIS, ROUTINE W REFLEX MICROSCOPIC
Bilirubin Urine: NEGATIVE
GLUCOSE, UA: NEGATIVE mg/dL
Hgb urine dipstick: NEGATIVE
KETONES UR: NEGATIVE mg/dL
LEUKOCYTES UA: NEGATIVE
Nitrite: NEGATIVE
PH: 7.5 (ref 5.0–8.0)
Protein, ur: NEGATIVE mg/dL
Specific Gravity, Urine: 1.006 (ref 1.005–1.030)
Urobilinogen, UA: 0.2 mg/dL (ref 0.0–1.0)

## 2014-06-11 LAB — PREGNANCY, URINE: Preg Test, Ur: NEGATIVE

## 2014-06-11 LAB — LIPASE, BLOOD: Lipase: 105 U/L — ABNORMAL HIGH (ref 11–59)

## 2014-06-11 SURGERY — APPENDECTOMY, LAPAROSCOPIC
Anesthesia: General

## 2014-06-11 MED ORDER — MORPHINE SULFATE 4 MG/ML IJ SOLN
4.0000 mg | Freq: Once | INTRAMUSCULAR | Status: AC
  Administered 2014-06-11: 4 mg via INTRAVENOUS
  Filled 2014-06-11: qty 1

## 2014-06-11 MED ORDER — SODIUM CHLORIDE 0.9 % IV BOLUS (SEPSIS)
1000.0000 mL | Freq: Once | INTRAVENOUS | Status: AC
  Administered 2014-06-11: 1000 mL via INTRAVENOUS

## 2014-06-11 MED ORDER — HYDROMORPHONE HCL PF 1 MG/ML IJ SOLN
1.0000 mg | Freq: Once | INTRAMUSCULAR | Status: AC
  Administered 2014-06-11: 1 mg via INTRAVENOUS
  Filled 2014-06-11: qty 1

## 2014-06-11 MED ORDER — NEOSTIGMINE METHYLSULFATE 10 MG/10ML IV SOLN
INTRAVENOUS | Status: AC
  Filled 2014-06-11: qty 1

## 2014-06-11 MED ORDER — ONDANSETRON HCL 4 MG/2ML IJ SOLN
INTRAMUSCULAR | Status: AC
  Filled 2014-06-11: qty 2

## 2014-06-11 MED ORDER — BUPIVACAINE-EPINEPHRINE 0.25% -1:200000 IJ SOLN
INTRAMUSCULAR | Status: AC
  Filled 2014-06-11: qty 1

## 2014-06-11 MED ORDER — DEXAMETHASONE SODIUM PHOSPHATE 10 MG/ML IJ SOLN
INTRAMUSCULAR | Status: AC
  Filled 2014-06-11: qty 1

## 2014-06-11 MED ORDER — MIDAZOLAM HCL 2 MG/2ML IJ SOLN
INTRAMUSCULAR | Status: AC
  Filled 2014-06-11: qty 2

## 2014-06-11 MED ORDER — GLYCOPYRROLATE 0.2 MG/ML IJ SOLN
INTRAMUSCULAR | Status: AC
  Filled 2014-06-11: qty 3

## 2014-06-11 MED ORDER — PROPOFOL 10 MG/ML IV BOLUS
INTRAVENOUS | Status: AC
  Filled 2014-06-11: qty 20

## 2014-06-11 MED ORDER — ROCURONIUM BROMIDE 100 MG/10ML IV SOLN
INTRAVENOUS | Status: AC
  Filled 2014-06-11: qty 1

## 2014-06-11 MED ORDER — ONDANSETRON HCL 4 MG/2ML IJ SOLN
4.0000 mg | Freq: Once | INTRAMUSCULAR | Status: AC
  Administered 2014-06-11: 4 mg via INTRAVENOUS
  Filled 2014-06-11: qty 2

## 2014-06-11 MED ORDER — PIPERACILLIN-TAZOBACTAM 3.375 G IVPB 30 MIN
3.3750 g | Freq: Once | INTRAVENOUS | Status: AC
  Administered 2014-06-11: 3.375 g via INTRAVENOUS
  Filled 2014-06-11 (×2): qty 50

## 2014-06-11 MED ORDER — LACTATED RINGERS IV SOLN
INTRAVENOUS | Status: DC | PRN
  Administered 2014-06-11 – 2014-06-12 (×2): via INTRAVENOUS

## 2014-06-11 MED ORDER — FENTANYL CITRATE 0.05 MG/ML IJ SOLN
INTRAMUSCULAR | Status: AC
  Filled 2014-06-11: qty 5

## 2014-06-11 SURGICAL SUPPLY — 42 items
APL SKNCLS STERI-STRIP NONHPOA (GAUZE/BANDAGES/DRESSINGS) ×1
APPLIER CLIP ROT 10 11.4 M/L (STAPLE)
APR CLP MED LRG 11.4X10 (STAPLE)
BAG SPEC RTRVL LRG 6X4 10 (ENDOMECHANICALS) ×1
BENZOIN TINCTURE PRP APPL 2/3 (GAUZE/BANDAGES/DRESSINGS) ×2 IMPLANT
CANISTER SUCTION 2500CC (MISCELLANEOUS) ×2 IMPLANT
CLIP APPLIE ROT 10 11.4 M/L (STAPLE) IMPLANT
CUTTER FLEX LINEAR 45M (STAPLE) ×1 IMPLANT
DECANTER SPIKE VIAL GLASS SM (MISCELLANEOUS) ×2 IMPLANT
DRAPE LAPAROSCOPIC ABDOMINAL (DRAPES) ×2 IMPLANT
ELECT REM PT RETURN 9FT ADLT (ELECTROSURGICAL) ×2
ELECTRODE REM PT RTRN 9FT ADLT (ELECTROSURGICAL) ×1 IMPLANT
ENDOLOOP SUT PDS II  0 18 (SUTURE)
ENDOLOOP SUT PDS II 0 18 (SUTURE) IMPLANT
GAUZE SPONGE 2X2 8PLY STRL LF (GAUZE/BANDAGES/DRESSINGS) IMPLANT
GLOVE BIOGEL PI IND STRL 7.0 (GLOVE) ×1 IMPLANT
GLOVE BIOGEL PI INDICATOR 7.0 (GLOVE) ×1
GLOVE SURG ORTHO 8.0 STRL STRW (GLOVE) ×2 IMPLANT
GOWN STRL REUS W/TWL LRG LVL3 (GOWN DISPOSABLE) ×2 IMPLANT
GOWN STRL REUS W/TWL XL LVL3 (GOWN DISPOSABLE) ×4 IMPLANT
KIT BASIN OR (CUSTOM PROCEDURE TRAY) ×2 IMPLANT
PENCIL BUTTON HOLSTER BLD 10FT (ELECTRODE) IMPLANT
POUCH SPECIMEN RETRIEVAL 10MM (ENDOMECHANICALS) ×1 IMPLANT
RELOAD 45 VASCULAR/THIN (ENDOMECHANICALS) IMPLANT
RELOAD STAPLE 45 2.5 WHT GRN (ENDOMECHANICALS) IMPLANT
RELOAD STAPLE 45 3.5 BLU ETS (ENDOMECHANICALS) IMPLANT
RELOAD STAPLE TA45 3.5 REG BLU (ENDOMECHANICALS) ×2 IMPLANT
SET IRRIG TUBING LAPAROSCOPIC (IRRIGATION / IRRIGATOR) ×1 IMPLANT
SHEARS HARMONIC ACE PLUS 36CM (ENDOMECHANICALS) ×1 IMPLANT
SOLUTION ANTI FOG 6CC (MISCELLANEOUS) ×2 IMPLANT
SPONGE GAUZE 2X2 STER 10/PKG (GAUZE/BANDAGES/DRESSINGS) ×1
STRIP CLOSURE SKIN 1/2X4 (GAUZE/BANDAGES/DRESSINGS) ×2 IMPLANT
SUT MNCRL AB 4-0 PS2 18 (SUTURE) ×2 IMPLANT
TAPE CLOTH SURG 4X10 WHT LF (GAUZE/BANDAGES/DRESSINGS) ×1 IMPLANT
TOWEL OR 17X26 10 PK STRL BLUE (TOWEL DISPOSABLE) ×2 IMPLANT
TRAY FOLEY CATH 14FRSI W/METER (CATHETERS) ×2 IMPLANT
TRAY LAP CHOLE (CUSTOM PROCEDURE TRAY) ×2 IMPLANT
TROCAR BLADELESS OPT 5 75 (ENDOMECHANICALS) ×2 IMPLANT
TROCAR XCEL BLUNT TIP 100MML (ENDOMECHANICALS) ×2 IMPLANT
TROCAR XCEL NON-BLD 11X100MML (ENDOMECHANICALS) ×2 IMPLANT
TUBING INSUFFLATION 10FT LAP (TUBING) ×2 IMPLANT
WATER STERILE IRR 1500ML POUR (IV SOLUTION) ×2 IMPLANT

## 2014-06-11 NOTE — ED Notes (Signed)
Bed: WA21 Expected date:  Expected time:  Means of arrival:  Comments: Pt from [facility]

## 2014-06-11 NOTE — ED Provider Notes (Signed)
CSN: [id number]     Arrival date & time 06/11/14  1746 History  This chart was scribed for Rolan BuccoMelanie Kamdyn Colborn, MD by Nicholos Johnsenise Iheanachor, ED scribe. This patient was seen in room MH09/MH09 and the patient's care was started at 6:20 PM.    Chief Complaint  Patient presents with  . Abdominal Pain   The history is provided by the patient and a parent. No language interpreter was used.   HPI Comments: Diane [name] is a 33 y.o. female w/ hx of kidney stones presents to the Emergency Department complaining of gradual onset, gradually worsening, constant RLQ abdominal pain w/ nausea; onset last PM. Also reports some mild back pain. Took a Phenergan last night without relief. On menstrual cycle; reports it is on time. Last kidney stone had to have a stent placed and lithotripsy. Currently treating bronchitis with amoxicillin. No hx of ovarian cyst. Recovering pain medication addict. Denies fever, dysuria, hematuria, vaginal discharge, or diarrhea.  Past Medical History  Diagnosis Date  . ADD (attention deficit disorder with hyperactivity)   . Depression   . IBS (irritable bowel syndrome)   . WUJWJXBJ(478.2Headache(784.0)     [facility]ine [facility]  [facility]  . PONV (postoperative nausea and vomiting)   . Maternal anemia, with delivery 11/22/2011  . Migraine    Past Surgical History  Procedure Laterality Date  . Cesarean section    . Extraction of wisdom teeth     . Tonsillectomy    . Cesarean section  11/20/2011    Procedure: CESAREAN SECTION;  Surgeon: Lenoard Adenichard J Taavon, MD;  Location: [facility];  Service: Gynecology;  Laterality: N/A;   Family History  Problem Relation Age of Onset  . Alcohol abuse      family hx  . Arthritis      family hx  . Melanoma      family hx  . Aneurysm      family hx  . Brain cancer Father     TUMOR  . Anesthesia problems Father   . Anesthesia problems Mother    History  Substance Use Topics  . Smoking status: Current Every Day Smoker -- 1.00 packs/day for 13 years    Types: Cigarettes    Last Attempt to Quit: 12/28/2013  . Smokeless tobacco: Never Used     Comment: USES VAPOR occ  . Alcohol Use: Yes     Comment: once a month   OB History   Grav Para Term Preterm Abortions TAB SAB Ect Mult Living   2 2 1 1  0 0 0 0 0 2     Review of Systems  Constitutional: Negative for fever, chills, diaphoresis and fatigue.  HENT: Negative for congestion, rhinorrhea and sneezing.   Eyes: Negative.   Respiratory: Negative for cough, chest tightness and shortness of breath.   Cardiovascular: Negative for chest pain and leg swelling.  Gastrointestinal: Positive for nausea and abdominal pain. Negative for vomiting, diarrhea and blood in stool.  Genitourinary: Negative for dysuria, frequency, hematuria, flank pain, vaginal discharge and difficulty urinating.  Musculoskeletal: Positive for back pain. Negative for arthralgias.  Skin: Negative for rash.  Neurological: Negative for dizziness, speech difficulty, weakness, numbness and headaches.   Allergies  Metoclopramide hcl; Nsaids; and Prochlorperazine edisylate  Home Medications   Prior to Admission medications   Medication Sig Start Date End Date Taking? Authorizing Provider  albuterol (PROVENTIL HFA;VENTOLIN HFA) 108 (90 BASE) MCG/ACT inhaler Inhale 2 puffs into the lungs every 4 (four) hours as  needed for wheezing or shortness of breath. 06/05/14  Yes Nelwyn Salisbury, MD  amoxicillin-clavulanate (AUGMENTIN) 875-125 MG per tablet Take 1 tablet by mouth 2 (two) times daily. 06/05/14  Yes Nelwyn Salisbury, MD  b complex vitamins tablet Take 2 tablets by mouth daily.    Yes Historical Provider, MD  cetirizine-pseudoephedrine (ZYRTEC-D) 5-120 MG per tablet Take 1 tablet by mouth daily.   Yes Historical Provider, MD  dexlansoprazole (DEXILANT) 60 MG capsule Take 1 capsule (60 mg total) by mouth 2 (two) times daily. 01/20/14  Yes Barbaraann Share, MD  Fe-Succ Ac-C-Thre Ac-B12-FA (FERREX 150 FORTE PLUS) 50-100 MG CAPS Take 150 mg  by mouth 2 (two) times daily. 08/24/13  Yes Nelwyn Salisbury, MD  fluconazole (DIFLUCAN) 150 MG tablet Take 1 tablet (150 mg total) by mouth once. 06/05/14  Yes Nelwyn Salisbury, MD  fluticasone (FLONASE) 50 MCG/ACT nasal spray as needed.   Yes Historical Provider, MD  gabapentin (NEURONTIN) 300 MG capsule Take 150 mg by mouth 2 (two) times daily.  07/29/13  Yes Nelwyn Salisbury, MD  HYDROcodone-homatropine (HYDROMET) 5-1.5 MG/5ML syrup Take 5 mLs by mouth every 4 (four) hours as needed for cough. 06/05/14  Yes Nelwyn Salisbury, MD  IUD's [facility] INTRAUTERINE COPPER) IUD IUD 1 each by Intrauterine route once.   Yes Historical Provider, MD  labetalol (NORMODYNE) 200 MG tablet TAKE 1/2 TABLET BY MOUTH TWICE DAILY 04/05/14  Yes Nelwyn Salisbury, MD  LORazepam (ATIVAN) 1 MG tablet Take 1 tablet (1 mg total) by mouth every 6 (six) hours as needed for anxiety. 08/01/13  Yes Nelwyn Salisbury, MD  Magnesium 500 MG CAPS Take 1,000 mg by mouth daily.   Yes Historical Provider, MD  promethazine (PHENERGAN) 25 MG tablet Take 25 mg by mouth every 6 (six) hours as needed for nausea.   Yes Historical Provider, MD  ranitidine (ZANTAC) 150 MG capsule Take 150 mg by mouth 2 (two) times daily.   Yes Historical Provider, MD  tiZANidine (ZANAFLEX) 4 MG capsule Take 1 capsule (4 mg total) by mouth 3 (three) times daily as needed for muscle spasms. 07/29/13  Yes Nelwyn Salisbury, MD  topiramate (TOPAMAX) 100 MG tablet Take 4 tablets (400 mg total) by mouth daily. 07/29/13  Yes Nelwyn Salisbury, MD  TOVIAZ 4 MG TB24 tablet Take 1 tablet by mouth daily.   Yes Historical Provider, MD  traMADol (ULTRAM) 50 MG tablet Take 1 tablet (50 mg total) by mouth every 6 (six) hours as needed (cough). 01/24/14  Yes Tammy S Parrett, NP  venlafaxine XR (EFFEXOR-XR) 150 MG 24 hr capsule Take 150 mg by mouth daily. Take 2 capsules every morning   Yes Historical Provider, MD   Triage vitals: BP 111/67  Pulse 88  Resp 18  Ht 5\' 7"  (1.702 m)  Wt 186 lb (84.369 kg)  BMI  29.12 kg/m2  SpO2 100%  Physical Exam  Nursing note and vitals reviewed. Constitutional: She is oriented to person, place, and time. She appears well-developed and well-nourished.  HENT:  Head: Normocephalic and atraumatic.  Eyes: Pupils are equal, round, and reactive to light.  Neck: Normal range of motion. Neck supple.  Cardiovascular: Normal rate, regular rhythm and normal heart sounds.   Pulmonary/Chest: Effort normal and breath sounds normal. No respiratory distress. She has no wheezes. She has no rales. She exhibits no tenderness.  Abdominal: Soft. Bowel sounds are normal. There is tenderness in the right lower quadrant. There is CVA  tenderness. There is no rebound and no guarding.  Moderate tenderness in RLQ.   Musculoskeletal: Normal range of motion. She exhibits no edema.  Lymphadenopathy:    She has no cervical adenopathy.  Neurological: She is alert and oriented to person, place, and time.  Skin: Skin is warm and dry. No rash noted.  Psychiatric: She has a normal mood and affect.    ED Course  Procedures (including critical care time) DIAGNOSTIC STUDIES: Oxygen Saturation is 100% on room air, normal by my interpretation.    COORDINATION OF CARE: At 6:23 PM: Discussed treatment plan with patient which includes CT scan of the abdomen. Patient agrees.    Labs Review Labs Reviewed  CBC WITH DIFFERENTIAL - Abnormal; Notable for the following:    WBC 13.3 (*)    RBC 3.81 (*)    Hemoglobin 11.5 (*)    HCT 35.1 (*)    Neutro Abs 9.2 (*)    Monocytes Absolute 1.2 (*)    All other components within normal limits  LIPASE, BLOOD - Abnormal; Notable for the following:    Lipase 105 (*)    All other components within normal limits  COMPREHENSIVE METABOLIC PANEL  URINALYSIS, ROUTINE W REFLEX MICROSCOPIC  PREGNANCY, URINE    Imaging Review Ct Abdomen Pelvis Wo Contrast  06/11/2014   CLINICAL DATA:  Complaining of right lower quadrant pain since last night, with nausea   EXAM: CT ABDOMEN AND PELVIS WITHOUT CONTRAST  TECHNIQUE: Multidetector CT imaging of the abdomen and pelvis was performed following the standard protocol without IV contrast.  COMPARISON:  07/26/2013  FINDINGS: Minimal dependent atelectasis bilaterally. Lung bases otherwise clear. No acute musculoskeletal findings.  Liver and gallbladder are normal. The gallbladder is mildly distended. Spleen and pancreas are normal.  Adrenal glands are normal. Kidneys are normal. Abdominal aorta shows no dilatation or calcification.  Bladder is normal. There is an intrauterine device present. Reproductive organs otherwise normal.  There stool retained throughout the colon. Small bowel is normal. The appendix is markedly inflamed and distended to 2 cm. There is an at appendicolith in the tip of the appendix. The appendix arises off of the cecum and extends posteriorly and superiorly in a retrocecal position. There is moderate inflammatory change around the appendix with a small volume of free fluid in the pericolic gutter and in the cul-de-sac of the pelvis. There is no evidence of free air or abscess.  IMPRESSION: Acute appendicitis   Electronically Signed   By: Esperanza Heir M.D.   On: 06/11/2014 20:57     EKG Interpretation None      MDM   Final diagnoses:  Acute appendicitis with localized peritonitis   Pt was started on zosyn, given IVF.  Discussed with Dr. Gerrit Friends who has accepted pt for transfer to [facility] ED.  Notified Dr. Fayrene Fearing, EDP at [facility].  I personally performed the services described in this documentation, which was scribed in my presence. The recorded information has been reviewed and is accurate.     Rolan Bucco, MD 06/11/14 2132

## 2014-06-11 NOTE — H&P (Signed)
Diane [name] is an 33 y.o. female.    General Surgery [facility]  Chief Complaint: abdominal pain, acute appendicitis  HPI: patient is a 33 yo WF with greater than 24 hour hx of abdominal pain localized to the RLQ and radiating to the back.  Patient has had nausea but no emesis.  Denies fever or chills.  Presented to the [facility] ER for evaluation.  WBC elevated. CT abd positive for acute appendicitis with a retrocecal appendix.  Previous abdominal surgery includes C-sections.  Unasyn started by ER.  Past Medical History  Diagnosis Date  . ADD (attention deficit disorder with hyperactivity)   . Depression   . IBS (irritable bowel syndrome)   . MBWGYKZL(935.7)     Dr Amil Amen at  [facility]  . PONV (postoperative nausea and vomiting)   . Maternal anemia, with delivery 11/22/2011  . Migraine     Past Surgical History  Procedure Laterality Date  . Cesarean section    . Extraction of wisdom teeth     . Tonsillectomy    . Cesarean section  11/20/2011    Procedure: CESAREAN SECTION;  Surgeon: Lovenia Kim, MD;  Location: [facility];  Service: Gynecology;  Laterality: N/A;    Family History  Problem Relation Age of Onset  . Alcohol abuse      family hx  . Arthritis      family hx  . Melanoma      family hx  . Aneurysm      family hx  . Brain cancer Father     TUMOR  . Anesthesia problems Father   . Anesthesia problems Mother    Social History:  reports that she has been smoking Cigarettes.  She has a 13 pack-year smoking history. She has never used smokeless tobacco. She reports that she drinks alcohol. She reports that she does not use illicit drugs.  Allergies:  Allergies  Allergen Reactions  . Metoclopramide Hcl Other (See Comments)    Anxiety/restlessness  . Nsaids Other (See Comments)    Pt does not take nsaids bc of a history of ulcers  . Prochlorperazine Edisylate Other (See Comments)    Anxiety/restlessness       (Not in a hospital admission)  Results for orders placed during the hospital encounter of 06/11/14 (from the past 48 hour(s))  CBC WITH DIFFERENTIAL     Status: Abnormal   Collection Time    06/11/14  6:14 PM      Result Value Ref Range   WBC 13.3 (*) 4.0 - 10.5 K/uL   RBC 3.81 (*) 3.87 - 5.11 MIL/uL   Hemoglobin 11.5 (*) 12.0 - 15.0 g/dL   HCT 35.1 (*) 36.0 - 46.0 %   MCV 92.1  78.0 - 100.0 fL   MCH 30.2  26.0 - 34.0 pg   MCHC 32.8  30.0 - 36.0 g/dL   RDW 13.1  11.5 - 15.5 %   Platelets 264  150 - 400 K/uL   Neutrophils Relative % 69  43 - 77 %   Neutro Abs 9.2 (*) 1.7 - 7.7 K/uL   Lymphocytes Relative 17  12 - 46 %   Lymphs Abs 2.2  0.7 - 4.0 K/uL   Monocytes Relative 9  3 - 12 %   Monocytes Absolute 1.2 (*) 0.1 - 1.0 K/uL   Eosinophils Relative 5  0 - 5 %   Eosinophils Absolute 0.6  0.0 - 0.7 K/uL  Basophils Relative 0  0 - 1 %   Basophils Absolute 0.0  0.0 - 0.1 K/uL  COMPREHENSIVE METABOLIC PANEL     Status: None   Collection Time    06/11/14  6:14 PM      Result Value Ref Range   Sodium 139  137 - 147 mEq/L   Potassium 4.0  3.7 - 5.3 mEq/L   Chloride 106  96 - 112 mEq/L   CO2 20  19 - 32 mEq/L   Glucose, Bld 96  70 - 99 mg/dL   BUN 14  6 - 23 mg/dL   Creatinine, Ser 0.70  0.50 - 1.10 mg/dL   Calcium 9.2  8.4 - 10.5 mg/dL   Total Protein 7.5  6.0 - 8.3 g/dL   Albumin 4.2  3.5 - 5.2 g/dL   AST 24  0 - 37 U/L   ALT 22  0 - 35 U/L   Alkaline Phosphatase 91  39 - 117 U/L   Total Bilirubin 0.3  0.3 - 1.2 mg/dL   GFR calc non Af Amer >90  >90 mL/min   GFR calc Af Amer >90  >90 mL/min   Comment: (NOTE)     The eGFR has been calculated using the CKD EPI equation.     This calculation has not been validated in all clinical situations.     eGFR's persistently <90 mL/min signify possible Chronic Kidney     Disease.   Anion gap 13  5 - 15  LIPASE, BLOOD     Status: Abnormal   Collection Time    06/11/14  6:14 PM      Result Value Ref Range   Lipase 105 (*) 11  - 59 U/L  URINALYSIS, ROUTINE W REFLEX MICROSCOPIC     Status: None   Collection Time    06/11/14  7:43 PM      Result Value Ref Range   Color, Urine YELLOW  YELLOW   APPearance CLEAR  CLEAR   Specific Gravity, Urine 1.006  1.005 - 1.030   pH 7.5  5.0 - 8.0   Glucose, UA NEGATIVE  NEGATIVE mg/dL   Hgb urine dipstick NEGATIVE  NEGATIVE   Bilirubin Urine NEGATIVE  NEGATIVE   Ketones, ur NEGATIVE  NEGATIVE mg/dL   Protein, ur NEGATIVE  NEGATIVE mg/dL   Urobilinogen, UA 0.2  0.0 - 1.0 mg/dL   Nitrite NEGATIVE  NEGATIVE   Leukocytes, UA NEGATIVE  NEGATIVE   Comment: MICROSCOPIC NOT DONE ON URINES WITH NEGATIVE PROTEIN, BLOOD, LEUKOCYTES, NITRITE, OR GLUCOSE <1000 mg/dL.  PREGNANCY, URINE     Status: None   Collection Time    06/11/14  7:43 PM      Result Value Ref Range   Preg Test, Ur NEGATIVE  NEGATIVE   Comment:            THE SENSITIVITY OF THIS     METHODOLOGY IS >20 mIU/mL.   Ct Abdomen Pelvis Wo Contrast  06/11/2014   CLINICAL DATA:  Complaining of right lower quadrant pain since last night, with nausea  EXAM: CT ABDOMEN AND PELVIS WITHOUT CONTRAST  TECHNIQUE: Multidetector CT imaging of the abdomen and pelvis was performed following the standard protocol without IV contrast.  COMPARISON:  07/26/2013  FINDINGS: Minimal dependent atelectasis bilaterally. Lung bases otherwise clear. No acute musculoskeletal findings.  Liver and gallbladder are normal. The gallbladder is mildly distended. Spleen and pancreas are normal.  Adrenal glands are normal. Kidneys are normal. Abdominal aorta shows  no dilatation or calcification.  Bladder is normal. There is an intrauterine device present. Reproductive organs otherwise normal.  There stool retained throughout the colon. Small bowel is normal. The appendix is markedly inflamed and distended to 2 cm. There is an at appendicolith in the tip of the appendix. The appendix arises off of the cecum and extends posteriorly and superiorly in a retrocecal  position. There is moderate inflammatory change around the appendix with a small volume of free fluid in the pericolic gutter and in the cul-de-sac of the pelvis. There is no evidence of free air or abscess.  IMPRESSION: Acute appendicitis   Electronically Signed   By: Skipper Cliche M.D.   On: 06/11/2014 20:57    Review of Systems  Constitutional: Positive for diaphoresis.  HENT: Negative.   Eyes: Negative.   Respiratory: Positive for cough.   Cardiovascular: Negative.   Gastrointestinal: Positive for nausea and abdominal pain (RLQ). Negative for diarrhea and constipation.  Genitourinary: Negative.   Musculoskeletal: Positive for back pain.  Skin: Negative.   Neurological: Negative.   Endo/Heme/Allergies: Negative.   Psychiatric/Behavioral: Negative.     Blood pressure 93/67, pulse 78, temperature 98.7 F (37.1 C), temperature source Oral, resp. rate 22, height _0  (1.702 m), weight 186 lb (84.369 kg), SpO2 99.00%. Physical Exam  Constitutional: She is oriented to person, place, and time. She appears well-developed and well-nourished. She appears distressed.  HENT:  Head: Normocephalic and atraumatic.  Right Ear: External ear normal.  Left Ear: External ear normal.  Eyes: Conjunctivae are normal. Pupils are equal, round, and reactive to light. No scleral icterus.  Neck: Normal range of motion. Neck supple. No thyromegaly present.  Cardiovascular: Normal rate, regular rhythm and normal heart sounds.   No murmur heard. Respiratory: Effort normal and breath sounds normal. She has no wheezes. She has no rales.  GI: Soft. She exhibits no distension and no mass. There is tenderness (RLQ). There is guarding. There is no rebound.  Musculoskeletal: Normal range of motion. She exhibits no edema.  Lymphadenopathy:    She has no cervical adenopathy.  Neurological: She is alert and oriented to person, place, and time.  Skin: Skin is warm and dry.  Psychiatric: She has a normal mood and  affect. Her behavior is normal.     Assessment/Plan Acute appendicitis  Plan lap appendectomy, possible open  Discussed need for surgery with patient and her mother.  Discussed retrocecal location and possible need for open surgery.  Discussed hospital stay to be anticipated.  They understand and wish to proceed.  The risks and benefits of the procedure have been discussed at length with the patient.  The patient understands the proposed procedure, potential alternative treatments, and the course of recovery to be expected.  All of the patient's questions have been answered at this time.  The patient wishes to proceed with surgery.  Earnstine Regal, MD, [facility] Office: [place] 06/11/2014, 11:11 PM

## 2014-06-11 NOTE — Anesthesia Preprocedure Evaluation (Addendum)
Anesthesia Evaluation  Patient identified by MRN, date of birth, ID band Patient awake    Reviewed: Allergy & Precautions, H&P , NPO status , Patient's Chart, lab work & pertinent test results  History of Anesthesia Complications (+) PONV and history of anesthetic complications  Airway Mallampati: II TM Distance: >3 FB Neck ROM: Full    Dental no notable dental hx. (+) Teeth Intact   Pulmonary neg pulmonary ROS, Current Smoker,  breath sounds clear to auscultation  Pulmonary exam normal       Cardiovascular negative cardio ROS  Rhythm:Regular Rate:Normal     Neuro/Psych  Headaches, PSYCHIATRIC DISORDERS Anxiety Depression    GI/Hepatic Neg liver ROS, GERD-  Medicated and Controlled,(+)     substance abuse  , Acute Appendicitis   Endo/Other  negative endocrine ROS  Renal/GU negative Renal ROS  negative genitourinary   Musculoskeletal negative musculoskeletal ROS (+)   Abdominal (+) + obese,   Peds  Hematology  (+) anemia ,   Anesthesia Other Findings   Reproductive/Obstetrics negative OB ROS                          Anesthesia Physical Anesthesia Plan  ASA: II and emergent  Anesthesia Plan: General   Post-op Pain Management:    Induction: Rapid sequence, Cricoid pressure planned and Intravenous  Airway Management Planned: Oral ETT  Additional Equipment:   Intra-op Plan:   Post-operative Plan: Extubation in OR  Informed Consent: I have reviewed the patients History and Physical, chart, labs and discussed the procedure including the risks, benefits and alternatives for the proposed anesthesia with the patient or authorized representative who has indicated his/her understanding and acceptance.   Dental advisory given  Plan Discussed with: Anesthesiologist, CRNA and Surgeon  Anesthesia Plan Comments:         Anesthesia Quick Evaluation

## 2014-06-11 NOTE — ED Notes (Signed)
Bed: WA21 Expected date:  Expected time:  Means of arrival:  Comments: Transfer from [facility] - acute appendicits

## 2014-06-11 NOTE — ED Notes (Signed)
[facility] on site for transport. Report given.

## 2014-06-11 NOTE — ED Notes (Signed)
[facility],RN [facility]

## 2014-06-11 NOTE — ED Notes (Signed)
Pt presents to ED with complaints of rt lower quadrant pain since last night with nausea.

## 2014-06-12 ENCOUNTER — Encounter (HOSPITAL_COMMUNITY): Payer: Self-pay | Admitting: *Deleted

## 2014-06-12 DIAGNOSIS — K3580 Unspecified acute appendicitis: Secondary | ICD-10-CM | POA: Diagnosis present

## 2014-06-12 MED ORDER — HYDROMORPHONE HCL PF 1 MG/ML IJ SOLN
INTRAMUSCULAR | Status: AC
  Filled 2014-06-12: qty 1

## 2014-06-12 MED ORDER — BUPIVACAINE-EPINEPHRINE 0.25% -1:200000 IJ SOLN
INTRAMUSCULAR | Status: DC | PRN
  Administered 2014-06-12: 50 mL

## 2014-06-12 MED ORDER — FESOTERODINE FUMARATE ER 4 MG PO TB24
4.0000 mg | ORAL_TABLET | Freq: Every day | ORAL | Status: DC
  Administered 2014-06-12 – 2014-06-13 (×2): 4 mg via ORAL
  Filled 2014-06-12 (×2): qty 1

## 2014-06-12 MED ORDER — LORAZEPAM 1 MG PO TABS
1.0000 mg | ORAL_TABLET | Freq: Four times a day (QID) | ORAL | Status: DC | PRN
  Administered 2014-06-12: 1 mg via ORAL
  Filled 2014-06-12: qty 1

## 2014-06-12 MED ORDER — NEOSTIGMINE METHYLSULFATE 10 MG/10ML IV SOLN
INTRAVENOUS | Status: DC | PRN
  Administered 2014-06-12: 4 mg via INTRAVENOUS

## 2014-06-12 MED ORDER — FENTANYL CITRATE 0.05 MG/ML IJ SOLN
INTRAMUSCULAR | Status: DC | PRN
  Administered 2014-06-11: 100 ug via INTRAVENOUS
  Administered 2014-06-12 (×3): 50 ug via INTRAVENOUS

## 2014-06-12 MED ORDER — ONDANSETRON HCL 4 MG PO TABS: 4.0000 mg | ORAL_TABLET | Freq: Four times a day (QID) | ORAL | Status: DC | PRN

## 2014-06-12 MED ORDER — MORPHINE SULFATE 2 MG/ML IJ SOLN: 1.0000 mg | Freq: Four times a day (QID) | INTRAMUSCULAR | Status: DC | PRN

## 2014-06-12 MED ORDER — ONDANSETRON HCL 4 MG/2ML IJ SOLN
INTRAMUSCULAR | Status: DC | PRN
  Administered 2014-06-12: 4 mg via INTRAVENOUS

## 2014-06-12 MED ORDER — DEXAMETHASONE SODIUM PHOSPHATE 10 MG/ML IJ SOLN
INTRAMUSCULAR | Status: DC | PRN
  Administered 2014-06-12: 10 mg via INTRAVENOUS

## 2014-06-12 MED ORDER — PROMETHAZINE HCL 25 MG/ML IJ SOLN: 12.5000 mg | Freq: Once | INTRAMUSCULAR | Status: DC | PRN

## 2014-06-12 MED ORDER — METHOCARBAMOL 500 MG PO TABS
1000.0000 mg | ORAL_TABLET | Freq: Three times a day (TID) | ORAL | Status: DC | PRN
  Administered 2014-06-12 – 2014-06-13 (×3): 1000 mg via ORAL
  Filled 2014-06-12 (×4): qty 2

## 2014-06-12 MED ORDER — HYDROCODONE-ACETAMINOPHEN 5-325 MG PO TABS
1.0000 | ORAL_TABLET | ORAL | Status: DC | PRN
  Administered 2014-06-12 – 2014-06-13 (×6): 2 via ORAL
  Filled 2014-06-12 (×6): qty 2

## 2014-06-12 MED ORDER — PROMETHAZINE HCL 25 MG/ML IJ SOLN
INTRAMUSCULAR | Status: AC
  Filled 2014-06-12: qty 1

## 2014-06-12 MED ORDER — ONDANSETRON HCL 4 MG/2ML IJ SOLN
4.0000 mg | Freq: Four times a day (QID) | INTRAMUSCULAR | Status: DC | PRN
  Administered 2014-06-12 (×2): 4 mg via INTRAVENOUS
  Filled 2014-06-12: qty 2

## 2014-06-12 MED ORDER — ENOXAPARIN SODIUM 40 MG/0.4ML ~~LOC~~ SOLN
40.0000 mg | SUBCUTANEOUS | Status: DC
  Administered 2014-06-12 – 2014-06-13 (×2): 40 mg via SUBCUTANEOUS
  Filled 2014-06-12 (×3): qty 0.4

## 2014-06-12 MED ORDER — LACTATED RINGERS IR SOLN
Status: DC | PRN
  Administered 2014-06-12: 1000 mL

## 2014-06-12 MED ORDER — PROMETHAZINE HCL 25 MG/ML IJ SOLN
6.2500 mg | INTRAMUSCULAR | Status: DC | PRN
  Administered 2014-06-12: 12.5 mg via INTRAVENOUS

## 2014-06-12 MED ORDER — VENLAFAXINE HCL ER 150 MG PO CP24
300.0000 mg | ORAL_CAPSULE | Freq: Every day | ORAL | Status: DC
  Administered 2014-06-12 – 2014-06-13 (×2): 300 mg via ORAL
  Filled 2014-06-12 (×2): qty 2

## 2014-06-12 MED ORDER — ROCURONIUM BROMIDE 100 MG/10ML IV SOLN
INTRAVENOUS | Status: DC | PRN
  Administered 2014-06-12: 20 mg via INTRAVENOUS
  Administered 2014-06-12: 10 mg via INTRAVENOUS

## 2014-06-12 MED ORDER — HYDROMORPHONE HCL PF 1 MG/ML IJ SOLN
0.2500 mg | INTRAMUSCULAR | Status: DC | PRN
  Administered 2014-06-12: 0.5 mg via INTRAVENOUS

## 2014-06-12 MED ORDER — GABAPENTIN 300 MG PO CAPS
300.0000 mg | ORAL_CAPSULE | Freq: Two times a day (BID) | ORAL | Status: DC
  Administered 2014-06-12 – 2014-06-13 (×3): 300 mg via ORAL
  Filled 2014-06-12 (×4): qty 1

## 2014-06-12 MED ORDER — MEPERIDINE HCL 50 MG/ML IJ SOLN: 6.2500 mg | INTRAMUSCULAR | Status: DC | PRN

## 2014-06-12 MED ORDER — LABETALOL HCL 100 MG PO TABS
100.0000 mg | ORAL_TABLET | Freq: Two times a day (BID) | ORAL | Status: DC
  Filled 2014-06-12 (×4): qty 1

## 2014-06-12 MED ORDER — PNEUMOCOCCAL VAC POLYVALENT 25 MCG/0.5ML IJ INJ
0.5000 mL | INJECTION | INTRAMUSCULAR | Status: DC
  Filled 2014-06-12 (×2): qty 0.5

## 2014-06-12 MED ORDER — GLYCOPYRROLATE 0.2 MG/ML IJ SOLN
INTRAMUSCULAR | Status: DC | PRN
  Administered 2014-06-12: 0.6 mg via INTRAVENOUS

## 2014-06-12 MED ORDER — PANTOPRAZOLE SODIUM 40 MG PO TBEC
40.0000 mg | DELAYED_RELEASE_TABLET | Freq: Two times a day (BID) | ORAL | Status: DC
  Administered 2014-06-12 – 2014-06-13 (×3): 40 mg via ORAL
  Filled 2014-06-12 (×4): qty 1

## 2014-06-12 MED ORDER — PROPOFOL 10 MG/ML IV BOLUS
INTRAVENOUS | Status: DC | PRN
  Administered 2014-06-11: 150 mg via INTRAVENOUS

## 2014-06-12 MED ORDER — KCL IN DEXTROSE-NACL 30-5-0.45 MEQ/L-%-% IV SOLN
INTRAVENOUS | Status: DC
  Administered 2014-06-12: 75 mL/h via INTRAVENOUS
  Filled 2014-06-12 (×3): qty 1000

## 2014-06-12 MED ORDER — ALBUTEROL SULFATE HFA 108 (90 BASE) MCG/ACT IN AERS: 2.0000 | INHALATION_SPRAY | RESPIRATORY_TRACT | Status: DC | PRN

## 2014-06-12 MED ORDER — FAMOTIDINE 20 MG PO TABS
20.0000 mg | ORAL_TABLET | Freq: Two times a day (BID) | ORAL | Status: DC
  Administered 2014-06-12 – 2014-06-13 (×3): 20 mg via ORAL
  Filled 2014-06-12 (×4): qty 1

## 2014-06-12 MED ORDER — LACTATED RINGERS IV BOLUS (SEPSIS)
1000.0000 mL | Freq: Once | INTRAVENOUS | Status: AC
  Administered 2014-06-12: 1000 mL via INTRAVENOUS

## 2014-06-12 MED ORDER — PIPERACILLIN-TAZOBACTAM 3.375 G IVPB
3.3750 g | Freq: Three times a day (TID) | INTRAVENOUS | Status: DC
  Administered 2014-06-12 – 2014-06-13 (×4): 3.375 g via INTRAVENOUS
  Filled 2014-06-12 (×5): qty 50

## 2014-06-12 MED ORDER — ACETAMINOPHEN 325 MG PO TABS
650.0000 mg | ORAL_TABLET | ORAL | Status: DC | PRN
  Administered 2014-06-12: 650 mg via ORAL

## 2014-06-12 MED ORDER — MIDAZOLAM HCL 5 MG/5ML IJ SOLN
INTRAMUSCULAR | Status: DC | PRN
  Administered 2014-06-11: 2 mg via INTRAVENOUS

## 2014-06-12 MED ORDER — ONDANSETRON HCL 4 MG/2ML IJ SOLN: 4.0000 mg | Freq: Once | INTRAMUSCULAR | Status: DC | PRN

## 2014-06-12 MED ORDER — TOPIRAMATE 100 MG PO TABS
400.0000 mg | ORAL_TABLET | Freq: Every evening | ORAL | Status: DC | PRN
  Administered 2014-06-12: 400 mg via ORAL
  Filled 2014-06-12: qty 4

## 2014-06-12 MED ORDER — 0.9 % SODIUM CHLORIDE (POUR BTL) OPTIME
TOPICAL | Status: DC | PRN
  Administered 2014-06-12: 1000 mL

## 2014-06-12 MED ORDER — SUCCINYLCHOLINE CHLORIDE 20 MG/ML IJ SOLN
INTRAMUSCULAR | Status: DC | PRN
  Administered 2014-06-11: 100 mg via INTRAVENOUS

## 2014-06-12 MED ORDER — ALBUTEROL SULFATE (2.5 MG/3ML) 0.083% IN NEBU: 2.5000 mg | INHALATION_SOLUTION | RESPIRATORY_TRACT | Status: DC | PRN

## 2014-06-12 MED ORDER — MORPHINE SULFATE 2 MG/ML IJ SOLN
1.0000 mg | INTRAMUSCULAR | Status: DC | PRN
  Administered 2014-06-12: 2 mg via INTRAVENOUS
  Filled 2014-06-12: qty 1

## 2014-06-12 NOTE — Progress Notes (Signed)
Utilization review completed.  

## 2014-06-12 NOTE — Anesthesia Postprocedure Evaluation (Signed)
  Anesthesia Post-op Note  Patient: Diane [name]  Procedure(s) Performed: Procedure(s): APPENDECTOMY LAPAROSCOPIC (N/A)  Patient Location: PACU  Anesthesia Type:General  Level of Consciousness: awake, alert  and oriented  Airway and Oxygen Therapy: Patient Spontanous Breathing  Post-op Pain: mild  Post-op Assessment: Post-op Vital signs reviewed, Patient's Cardiovascular Status Stable, Respiratory Function Stable, Patent Airway, No signs of Nausea or vomiting and Pain level controlled . Had some mild nausea in PACU, treated with Phenergan IV with improvement.  Post-op Vital Signs: Reviewed and stable  Last Vitals:  Filed Vitals:   06/12/14 0215  BP: 93/47  Pulse: 68  Temp:   Resp: 12    Complications: No apparent anesthesia complications

## 2014-06-12 NOTE — Progress Notes (Signed)
Pt feeling nauseated and very sore after walking.  Prob not able to go home today.

## 2014-06-12 NOTE — Transfer of Care (Signed)
Immediate Anesthesia Transfer of Care Note  Patient: Diane [name]  Procedure(s) Performed: Procedure(s): APPENDECTOMY LAPAROSCOPIC (N/A)  Patient Location: PACU  Anesthesia Type:General  Level of Consciousness: awake, alert , oriented and patient cooperative  Airway & Oxygen Therapy: Patient Spontanous Breathing and Patient connected to face mask oxygen  Post-op Assessment: Report given to PACU RN and Post -op Vital signs reviewed and stable  Post vital signs: Reviewed and stable  Complications: No apparent anesthesia complications

## 2014-06-12 NOTE — Op Note (Signed)
OPERATIVE REPORT - LAPAROSCOPIC APPENDECTOMY  Preop diagnosis: Acute appendicitis  Postop diagnosis: Same  Procedure: Laparoscopic appendectomy  Surgeon:  Velora Hecklerodd M. Tyrome Donatelli, MD, FACS  Anesthesia: General endotracheal  Estimated blood loss: Minimal  Preparation: Chlora-prep  Complications: None  Indications:  Patient is a 33 yo WF with greater than 24 hour hx of abdominal pain localized in the RLQ.  CT scan shows acute appendicitis with a retrocecal appendix.  Procedure:  Patient is brought to the operating room and placed in a supine position on the operating room table. Following administration of general anesthesia, a time out was held and the patient's name and procedure is confirmed. Patient is then prepped and draped in the usual strict aseptic fashion.  After ascertaining that an adequate level of anesthesia has been achieved, a peri-umbilical incision is made with a #15 blade. Dissection is carried down to the fascia. Fascia is incised in the midline and the peritoneal cavity is entered cautiously. A #0-vicryl pursestring suture is placed in the fascia. An Hassan cannula is introduced under direct vision and secured with the pursestring suture. The abdomen is insufflated with carbon dioxide. The laparoscope is introduced and the abdomen is explored. Operative ports are placed in the right upper quadrant and left lower quadrant. The appendix is obscured by the cecum and the adherent terminal ileum.  Bowel is gently mobilized and peritoneal attachments to the cecum are divided with the harmonic scalpel.  The base of the cecum is mobilized and the base of the appendix is identified.  A window is created in the mesoappendix and the base is transected with the Endo-GIA stapler. The mesoappendix is then divided with the harmonic scalpel. Dissection is carried down to the tip of the appendix. There is good approximation of tissue along the staple line. There is good hemostasis along the staple  line. The appendix is placed into an endo-catch bag and withdrawn through the umbilical port. The #0-vicryl pursestring suture is tied securely.  Right lower quadrant is irrigated with warm saline which is evacuated. Good hemostasis is noted. Ports are removed under direct vision. Good hemostasis is noted at the port sites. Pneumoperitoneum is released.  Skin incisions are anesthetized with local anesthetic. Wounds are closed with interrupted 4-0 Monocryl subcuticular sutures. Wounds are washed and dried and benzoin and Steri-Strips are applied. Dressings are applied. The patient is awakened from anesthesia and brought to the recovery room. The patient tolerated the procedure well.  Velora Hecklerodd M. Kacey Dysert, MD, [facility] Office: [phone]

## 2014-06-12 NOTE — Progress Notes (Signed)
[facility] Progress Note  1 Day Post-Op  Subjective: Very stiff, hadn't been OOB yet (now walked 2 laps in hall).  Tolerating clears.  No N/V.  Urinating well.  No flatus or BM yet.  Mom at bedside.    Objective: Vital signs in last 24 hours: Temp:  [97.9 F (36.6 C)-98.7 F (37.1 C)] 98.6 F (37 C) (07/27 0609) Pulse Rate:  [62-88] 62 (07/27 0609) Resp:  [12-22] 14 (07/27 0609) BP: (91-127)/(47-84) 105/59 mmHg (07/27 0609) SpO2:  [99 %-100 %] 100 % (07/27 0609) Weight:  [186 lb (84.369 kg)] 186 lb (84.369 kg) (07/26 1758)    Intake/Output from previous day: 07/26 0701 - 07/27 0700 In: 1730 [P.O.:380; I.V.:1300; IV Piggyback:50] Out: 150 [Urine:50; Blood:100] Intake/Output this shift:    PE: Gen:  Alert, NAD, pleasant Abd: Soft, mild distension, mild diffuse tenderness >RLQ, +BS, no HSM, incisions C/D/I   Lab Results:   Recent Labs  06/11/14 1814  WBC 13.3*  HGB 11.5*  HCT 35.1*  PLT 264   BMET  Recent Labs  06/11/14 1814  NA 139  K 4.0  CL 106  CO2 20  GLUCOSE 96  BUN 14  CREATININE 0.70  CALCIUM 9.2   PT/INR No results found for this basename: LABPROT, INR,  in the last 72 hours CMP     Component Value Date/Time   NA 139 06/11/2014 1814   K 4.0 06/11/2014 1814   CL 106 06/11/2014 1814   CO2 20 06/11/2014 1814   GLUCOSE 96 06/11/2014 1814   BUN 14 06/11/2014 1814   CREATININE 0.70 06/11/2014 1814   CALCIUM 9.2 06/11/2014 1814   PROT 7.5 06/11/2014 1814   ALBUMIN 4.2 06/11/2014 1814   AST 24 06/11/2014 1814   ALT 22 06/11/2014 1814   ALKPHOS 91 06/11/2014 1814   BILITOT 0.3 06/11/2014 1814   GFRNONAA >90 06/11/2014 1814   GFRAA >90 06/11/2014 1814   Lipase     Component Value Date/Time   LIPASE 105* 06/11/2014 1814       Studies/Results: Ct Abdomen Pelvis Wo Contrast  06/11/2014   CLINICAL DATA:  Complaining of right lower quadrant pain since last night, with nausea  EXAM: CT ABDOMEN AND PELVIS WITHOUT CONTRAST  TECHNIQUE:  Multidetector CT imaging of the abdomen and pelvis was performed following the standard protocol without IV contrast.  COMPARISON:  07/26/2013  FINDINGS: Minimal dependent atelectasis bilaterally. Lung bases otherwise clear. No acute musculoskeletal findings.  Liver and gallbladder are normal. The gallbladder is mildly distended. Spleen and pancreas are normal.  Adrenal glands are normal. Kidneys are normal. Abdominal aorta shows no dilatation or calcification.  Bladder is normal. There is an intrauterine device present. Reproductive organs otherwise normal.  There stool retained throughout the colon. Small bowel is normal. The appendix is markedly inflamed and distended to 2 cm. There is an at appendicolith in the tip of the appendix. The appendix arises off of the cecum and extends posteriorly and superiorly in a retrocecal position. There is moderate inflammatory change around the appendix with a small volume of free fluid in the pericolic gutter and in the cul-de-sac of the pelvis. There is no evidence of free air or abscess.  IMPRESSION: Acute appendicitis   Electronically Signed   By: Esperanza Heiraymond  Rubner M.D.   On: 06/11/2014 20:57    Anti-infectives: Anti-infectives   Start     Dose/Rate Route Frequency Ordered Stop   06/12/14 0500  piperacillin-tazobactam (ZOSYN) IVPB 3.375 g  3.375 g 12.5 mL/hr over 240 Minutes Intravenous 3 times per day 06/12/14 0332     06/11/14 2130  piperacillin-tazobactam (ZOSYN) IVPB 3.375 g     3.375 g 100 mL/hr over 30 Minutes Intravenous  Once 06/11/14 2116 06/11/14 2154       Assessment/Plan POD 0/1 s/p lap appy Acute appendicitis with retrocecal appendix   Plan: 1.  Advance diet to regular 2.  Ambulate and IS 3.  SCD's and lovenox 4.  May be able to d/c later today or tomorrow am depending on progress 5.  Continue IV antibiotics and send home on 5-7 days of Augmentin (she was already on Augmentin for bronchitis)    LOS: 1 day    DORT,  Wilford Merryfield 06/12/2014, 8:50 AM Pager: [phone]

## 2014-06-13 MED ORDER — HYDROCODONE-ACETAMINOPHEN 5-325 MG PO TABS: 1.0000 | ORAL_TABLET | ORAL | Status: DC | PRN

## 2014-06-13 MED ORDER — ACETAMINOPHEN 325 MG PO TABS: 650.0000 mg | ORAL_TABLET | ORAL | Status: DC | PRN

## 2014-06-13 MED ORDER — AMOXICILLIN-POT CLAVULANATE 875-125 MG PO TABS: 1.0000 | ORAL_TABLET | Freq: Two times a day (BID) | ORAL | Status: DC

## 2014-06-13 NOTE — Discharge Instructions (Signed)

## 2014-06-13 NOTE — Discharge Summary (Signed)
Physician Discharge Summary  Patient ID: Diane [name] MRN: [id number] DOB/AGE: February 19, 1981 33 y.o.  Admit date: 06/11/2014 Discharge date: 06/13/2014  Admitting Diagnosis: Acute appendicitis  Discharge Diagnosis Patient Active Problem List   Diagnosis Date Noted  . Acute appendicitis 06/12/2014  . Chronic cough 12/27/2013  . Pleural effusion 12/15/2013  . Bronchiolitis with influenza 12/15/2013  . Maternal anemia, with delivery 11/22/2011  . Postpartum care following cesarean delivery (1/3) 11/20/2011  . ACUTE SINUSITIS, UNSPECIFIED 11/21/2008  . DEPRESSION, ACUTE 10/02/2008  . NARCOTIC ABUSE 10/02/2008  . ACUTE BRONCHITIS 09/01/2008  . BENIGN INTRACRANIAL HYPERTENSION 06/16/2008  . BACK PAIN 03/28/2008  . WEIGHT GAIN 01/25/2008  . ANXIETY STATE, UNSPECIFIED 11/09/2007  . URI 08/18/2007  . HEADACHE 07/09/2007    Consultants none  Imaging: Ct Abdomen Pelvis Wo Contrast  06/11/2014   CLINICAL DATA:  Complaining of right lower quadrant pain since last night, with nausea  EXAM: CT ABDOMEN AND PELVIS WITHOUT CONTRAST  TECHNIQUE: Multidetector CT imaging of the abdomen and pelvis was performed following the standard protocol without IV contrast.  COMPARISON:  07/26/2013  FINDINGS: Minimal dependent atelectasis bilaterally. Lung bases otherwise clear. No acute musculoskeletal findings.  Liver and gallbladder are normal. The gallbladder is mildly distended. Spleen and pancreas are normal.  Adrenal glands are normal. Kidneys are normal. Abdominal aorta shows no dilatation or calcification.  Bladder is normal. There is an intrauterine device present. Reproductive organs otherwise normal.  There stool retained throughout the colon. Small bowel is normal. The appendix is markedly inflamed and distended to 2 cm. There is an at appendicolith in the tip of the appendix. The appendix arises off of the cecum and extends posteriorly and superiorly in a retrocecal position. There is moderate  inflammatory change around the appendix with a small volume of free fluid in the pericolic gutter and in the cul-de-sac of the pelvis. There is no evidence of free air or abscess.  IMPRESSION: Acute appendicitis   Electronically Signed   By: Esperanza Heir M.D.   On: 06/11/2014 20:57    Procedures Laparoscopic appendectomy---Dr. Gerrit Friends 06/12/14  Hospital Course:  Diane [name] is a 33 year old female with a history of depression, migraine headaches, IBS who presented to [facility] with abdominal pain.  Workup showed acute appendicitis.  Patient was admitted and underwent procedure listed above.  Tolerated procedure well and was transferred to the floor.  Diet was advanced as tolerated.  On POD#2, the patient was voiding well, tolerating diet, ambulating well, pain well controlled, vital signs stable, incisions c/d/i and felt stable for discharge home.  Patient will follow up in our office in 3 weeks and knows to call with questions or concerns.  Physical Exam: General:  Alert, NAD, pleasant, comfortable Abd:  Soft, ND, mild tenderness, incisions C/D/I    Medication List    STOP taking these medications       HYDROcodone-homatropine 5-1.5 MG/5ML syrup  Commonly known as:  HYDROMET      TAKE these medications       acetaminophen 325 MG tablet  Commonly known as:  TYLENOL  Take 2 tablets (650 mg total) by mouth every 4 (four) hours as needed for mild pain, fever or headache.     albuterol 108 (90 BASE) MCG/ACT inhaler  Commonly known as:  PROVENTIL HFA;VENTOLIN HFA  Inhale 2 puffs into the lungs every 4 (four) hours as needed for wheezing or shortness of breath.     amoxicillin-clavulanate 875-125 MG per tablet  Commonly known as:  AUGMENTIN  Take 1 tablet by mouth 2 (two) times daily.     b complex vitamins tablet  Take 2 tablets by mouth daily.     dexlansoprazole 60 MG capsule  Commonly known as:  DEXILANT  Take 1 capsule (60 mg total) by mouth 2 (two) times daily.     FERREX 150  FORTE PLUS 50-100 MG Caps  Take 150 mg by mouth 2 (two) times daily.     fluticasone 50 MCG/ACT nasal spray  Commonly known as:  FLONASE  Place 1 spray into the nose daily as needed for allergies.     gabapentin 300 MG capsule  Commonly known as:  NEURONTIN  Take 300 mg by mouth 2 (two) times daily.     HYDROcodone-acetaminophen 5-325 MG per tablet  Commonly known as:  NORCO/VICODIN  Take 1-2 tablets by mouth every 4 (four) hours as needed for moderate pain.     labetalol 200 MG tablet  Commonly known as:  NORMODYNE  Take 100 mg by mouth 2 (two) times daily.     LORazepam 1 MG tablet  Commonly known as:  ATIVAN  Take 1 tablet (1 mg total) by mouth every 6 (six) hours as needed for anxiety.     Magnesium 500 MG Caps  Take 1,000 mg by mouth daily.     PARAGARD INTRAUTERINE COPPER Iud IUD  1 each by Intrauterine route once.     promethazine 25 MG tablet  Commonly known as:  PHENERGAN  Take 25 mg by mouth every 6 (six) hours as needed for nausea.     ranitidine 150 MG capsule  Commonly known as:  ZANTAC  Take 150 mg by mouth 2 (two) times daily.     tiZANidine 4 MG capsule  Commonly known as:  ZANAFLEX  Take 1 capsule (4 mg total) by mouth 3 (three) times daily as needed for muscle spasms.     topiramate 100 MG tablet  Commonly known as:  TOPAMAX  Take 400 mg by mouth daily.     topiramate 100 MG tablet  Commonly known as:  TOPAMAX  Take 4 tablets (400 mg total) by mouth daily.     TOVIAZ 4 MG Tb24 tablet  Generic drug:  fesoterodine  Take 1 tablet by mouth daily.     traMADol 50 MG tablet  Commonly known as:  ULTRAM  Take 1 tablet (50 mg total) by mouth every 6 (six) hours as needed (cough).     venlafaxine XR 150 MG 24 hr capsule  Commonly known as:  EFFEXOR-XR  Take 300 mg by mouth daily. Take 2 capsules every morning             Follow-up Information   Follow up with [facility] On 07/04/2014. (arrive by 3:15PM for a 3:45PM appt for a  post op check)    Contact information:   [address]   [place] [address] [phone]       Signed: Ashok Norrismina Chariah Bailey, [facility]NP-BC [facility] [phone]  06/13/2014, 8:29 AM

## 2014-06-13 NOTE — Discharge Summary (Signed)
Agree with above. 1 week total antibiotics.

## 2014-06-14 NOTE — Progress Notes (Signed)
Late note:  Dilaudid .5mg  in dummy drawer of PYXIS, waste not recorded while pt in house.  Wasted at 2036 by Rolene ArbourK Gigliotti RN and Cindy HazyBeth George RN after phone clarification with Geanie CooleyK Jablonski RN.

## 2014-06-15 ENCOUNTER — Ambulatory Visit (INDEPENDENT_AMBULATORY_CARE_PROVIDER_SITE_OTHER): Payer: 59 | Admitting: General Surgery

## 2014-06-15 ENCOUNTER — Encounter (INDEPENDENT_AMBULATORY_CARE_PROVIDER_SITE_OTHER): Payer: Self-pay | Admitting: General Surgery

## 2014-06-15 VITALS — BP 124/80 | HR 81 | Temp 97.2°F | Ht 67.0 in | Wt 191.0 lb

## 2014-06-15 DIAGNOSIS — Z9889 Other specified postprocedural states: Secondary | ICD-10-CM

## 2014-06-15 NOTE — Patient Instructions (Addendum)
Keep wound clean and dry.  If blisters open, place neosporin and cover with a dressing.

## 2014-06-15 NOTE — Telephone Encounter (Signed)
Per Message below, PA forms were filled out and faxed back. No forms can be located in scan prep documents or patient chart. [facility], still not approved through insurance-- $ 80 copay for 30 day supply one daily only Dr Shelle Ironlance prescribed for 60mg  BID Insurance will only cover 30 day supply one a day dose not 30 day supply BID dose Need PA completed to get approved Number given to call and have this quantity limitation overridden   [facility] ---- Per [facility] [facility] @ [phone], no PA was received   Patient ID # [id number] Sent to Clinical Review 7-14 days for determination  Will be faxed to triage fax once coverage is decided.   Called to make patient aware--no voicemail set up wcb

## 2014-06-15 NOTE — Progress Notes (Signed)
Diane [name] is a 33 y.o. female who is status post a lap appendectomy on 06/11/14.  She reports increased abd wall pain and blisters occuring at her steristrip sites.  She has not hada BM yet.  She is taking Miralax. She is recovering from bronchitis as well and is coughing a lot.   Objective: Filed Vitals:   06/15/14 1647  BP: 124/80  Pulse: 81  Temp: 97.2 F (36.2 C)    General appearance: alert and cooperative GI: normal findings: soft, appropriately tender  Incision: blisters noted on left side of steristrip.  No erythema.  No hernia.     Assessment: s/p  Patient Active Problem List   Diagnosis Date Noted  . Acute appendicitis 06/12/2014  . Chronic cough 12/27/2013  . Pleural effusion 12/15/2013  . Bronchiolitis with influenza 12/15/2013  . Maternal anemia, with delivery 11/22/2011  . Postpartum care following cesarean delivery (1/3) 11/20/2011  . ACUTE SINUSITIS, UNSPECIFIED 11/21/2008  . DEPRESSION, ACUTE 10/02/2008  . NARCOTIC ABUSE 10/02/2008  . ACUTE BRONCHITIS 09/01/2008  . BENIGN INTRACRANIAL HYPERTENSION 06/16/2008  . BACK PAIN 03/28/2008  . WEIGHT GAIN 01/25/2008  . ANXIETY STATE, UNSPECIFIED 11/09/2007  . URI 08/18/2007  . HEADACHE 07/09/2007    Plan: Diane Heftyshley Guidice is 4 days s/p appy.  I believe her blistering is due to traction during coughing.  I have recommended triple abx ointment once blisters open.  Increase Miralax to BID.  Call the office if her symptoms worsen.      Vanita Panda.Anijah Spohr C Clover Feehan, MD [facility] [place] [phone]   06/15/2014 4:55 PM

## 2014-06-19 ENCOUNTER — Telehealth (INDEPENDENT_AMBULATORY_CARE_PROVIDER_SITE_OTHER): Payer: Self-pay

## 2014-06-19 ENCOUNTER — Other Ambulatory Visit (INDEPENDENT_AMBULATORY_CARE_PROVIDER_SITE_OTHER): Payer: Self-pay

## 2014-06-19 ENCOUNTER — Telehealth: Payer: Self-pay | Admitting: Family Medicine

## 2014-06-19 DIAGNOSIS — G8918 Other acute postprocedural pain: Secondary | ICD-10-CM

## 2014-06-19 MED ORDER — HYDROCODONE-ACETAMINOPHEN 5-325 MG PO TABS: 1.0000 | ORAL_TABLET | ORAL | Status: DC | PRN

## 2014-06-19 NOTE — Telephone Encounter (Signed)
Called pt to Inform her that Rx for Vicodin 5/325mg  # 20 will be ready for her to pickup at the front desk. Pt verbalized understanding

## 2014-06-19 NOTE — Telephone Encounter (Signed)
OK to give vicodin #20 with no further refills.  tmg  Velora Hecklerodd M. Teniyah Seivert, MD, [facility] Office: [phone]

## 2014-06-19 NOTE — Telephone Encounter (Signed)
Called [employer] to see if we could correct the PA that was done for the dexilant.  Since GERD was not used for the DX, this can be done over the phone and this medication has been approved through 06/20/2015.  The pt is aware.   i called the pharmacy and they stated that this medication is still not going through.  Will wait a while and call them back.

## 2014-06-19 NOTE — Telephone Encounter (Signed)
Pt called stating she is being treated by Dr Clent RidgesFry for bronchitis. She states the coughing is making her incisions very sore. Pt request refill of her hydrocodone 5/325. Last refill 7-28 for #30. Pt was seen by Dr Maisie Fushomas on 7-30 due to blisters under and around her steri strips. Pt advised request will be sent to Dr Gerrit FriendsGerkin for review. Pt can be reached at [phone].

## 2014-06-19 NOTE — Telephone Encounter (Signed)
Pharmacy did get this to go through for the pt  Nothing further is needed.

## 2014-06-19 NOTE — Telephone Encounter (Signed)
Pt needs another round of hydrocodone cough med. Pt was seen on 06-05-14 for bronchitis. Pt had appendix removed a wk ago.

## 2014-06-19 NOTE — Telephone Encounter (Signed)
rx request sent to MD for review.

## 2014-06-19 NOTE — Telephone Encounter (Deleted)
Dexilant was denied by insurance for twice daily use. They will cover once a day but it is $80 a month. Please advise. Carron CurieJennifer Ronnie Mallette, CMA

## 2014-06-19 NOTE — Telephone Encounter (Signed)
I received a denial for dexilant. I have given this to Leigh to work on. I will forward message to her to document her findings. Carron CurieJennifer Bland Rudzinski, CMA

## 2014-06-20 MED ORDER — HYDROCODONE-HOMATROPINE 5-1.5 MG/5ML PO SYRP: 5.0000 mL | ORAL_SOLUTION | ORAL | Status: DC | PRN

## 2014-06-20 NOTE — Telephone Encounter (Signed)
Script is ready for pick up and I spoke with pt.  

## 2014-06-20 NOTE — Telephone Encounter (Signed)
done

## 2014-06-29 ENCOUNTER — Other Ambulatory Visit: Payer: Self-pay | Admitting: *Deleted

## 2014-06-29 MED ORDER — DEXLANSOPRAZOLE 60 MG PO CPDR: 60.0000 mg | DELAYED_RELEASE_CAPSULE | Freq: Two times a day (BID) | ORAL | Status: DC

## 2014-07-04 ENCOUNTER — Ambulatory Visit (INDEPENDENT_AMBULATORY_CARE_PROVIDER_SITE_OTHER): Payer: 59 | Admitting: General Surgery

## 2014-07-04 VITALS — BP 110/70 | HR 80 | Temp 97.1°F | Ht 67.0 in | Wt 190.0 lb

## 2014-07-04 DIAGNOSIS — K353 Acute appendicitis with localized peritonitis, without perforation or gangrene: Secondary | ICD-10-CM

## 2014-07-04 DIAGNOSIS — K3533 Acute appendicitis with perforation and localized peritonitis, with abscess: Secondary | ICD-10-CM

## 2014-07-04 NOTE — Patient Instructions (Signed)
Constipation Constipation is when a person has fewer than three bowel movements a week, has difficulty having a bowel movement, or has stools that are dry, hard, or larger than normal. As people grow older, constipation is more common. If you try to fix constipation with medicines that make you have a bowel movement (laxatives), the problem may get worse. Long-term laxative use may cause the muscles of the colon to become weak. A low-fiber diet, not taking in enough fluids, and taking certain medicines may make constipation worse.  CAUSES   Certain medicines, such as antidepressants, pain medicine, iron supplements, antacids, and water pills.   Certain diseases, such as diabetes, irritable bowel syndrome (IBS), thyroid disease, or depression.   Not drinking enough water.   Not eating enough fiber-rich foods.   Stress or travel.   Lack of physical activity or exercise.   Ignoring the urge to have a bowel movement.   Using laxatives too much.  SIGNS AND SYMPTOMS   Having fewer than three bowel movements a week.   Straining to have a bowel movement.   Having stools that are hard, dry, or larger than normal.   High-Fiber Diet Fiber is found in fruits, vegetables, and grains. A high-fiber diet encourages the addition of more whole grains, legumes, fruits, and vegetables in your diet. The recommended amount of fiber for adult males is 38 g per day. For adult females, it is 25 g per day. Pregnant and lactating women should get 28 g of fiber per day. If you have a digestive or bowel problem, ask your caregiver for advice before adding high-fiber foods to your diet. Eat a variety of high-fiber foods instead of only a select few type of foods.  PURPOSE  To increase stool bulk.  To make bowel movements more regular to prevent constipation.  To lower cholesterol.  To prevent overeating. WHEN IS THIS DIET USED?  It may be used if you have constipation and hemorrhoids.  It may  be used if you have uncomplicated diverticulosis (intestine condition) and irritable bowel syndrome.  It may be used if you need help with weight management.  It may be used if you want to add it to your diet as a protective measure against atherosclerosis, diabetes, and cancer. SOURCES OF FIBER  Whole-grain breads and cereals.  Fruits, such as apples, oranges, bananas, berries, prunes, and pears.  Vegetables, such as green peas, carrots, sweet potatoes, beets, broccoli, cabbage, spinach, and artichokes.  Legumes, such split peas, soy, lentils.  Almonds. FIBER CONTENT IN FOODS Starches and Grains / Dietary Fiber (g)  Cheerios, 1 cup / 3 g  Corn Flakes cereal, 1 cup / 0.7 g  Rice crispy treat cereal, 1 cup / 0.3 g  Instant oatmeal (cooked),  cup / 2 g  Frosted wheat cereal, 1 cup / 5.1 g  Brown, long-grain rice (cooked), 1 cup / 3.5 g  White, long-grain rice (cooked), 1 cup / 0.6 g  Enriched macaroni (cooked), 1 cup / 2.5 g Legumes / Dietary Fiber (g)  Baked beans (canned, plain, or vegetarian),  cup / 5.2 g  Kidney beans (canned),  cup / 6.8 g  Pinto beans (cooked),  cup / 5.5 g Breads and Crackers / Dietary Fiber (g)  Plain or honey graham crackers, 2 squares / 0.7 g  Saltine crackers, 3 squares / 0.3 g  Plain, salted pretzels, 10 pieces / 1.8 g  Whole-wheat bread, 1 slice / 1.9 g  White bread, 1 slice / 0.7  g  Raisin bread, 1 slice / 1.2 g  Plain bagel, 3 oz / 2 g  Flour tortilla, 1 oz / 0.9 g  Corn tortilla, 1 small / 1.5 g  Hamburger or hotdog bun, 1 small / 0.9 g Fruits / Dietary Fiber (g)  Apple with skin, 1 medium / 4.4 g  Sweetened applesauce,  cup / 1.5 g  Banana,  medium / 1.5 g  Grapes, 10 grapes / 0.4 g  Orange, 1 small / 2.3 g  Raisin, 1.5 oz / 1.6 g  Melon, 1 cup / 1.4 g Vegetables / Dietary Fiber (g)  Green beans (canned),  cup / 1.3 g  Carrots (cooked),  cup / 2.3 g  Broccoli (cooked),  cup / 2.8 g  Peas  (cooked),  cup / 4.4 g  Mashed potatoes,  cup / 1.6 g  Lettuce, 1 cup / 0.5 g  Corn (canned),  cup / 1.6 g  Tomato,  cup / 1.1 g Document Released: 11/03/2005 Document Revised: 05/04/2012 Document Reviewed: 02/05/2012 [employer] Patient Information 2015 [employer] [place]. This information is not intended to replace advice given to you by your health care provider. Make sure you discuss any questions you have with your health care provider.   Feeling full or bloated.   Pain in the lower abdomen.   Not feeling relief after having a bowel movement.  DIAGNOSIS  Your health care provider will take a medical history and perform a physical exam. Further testing may be done for severe constipation. Some tests may include:  A barium enema X-ray to examine your rectum, colon, and, sometimes, your small intestine.   A sigmoidoscopy to examine your lower colon.   A colonoscopy to examine your entire colon. TREATMENT  Treatment will depend on the severity of your constipation and what is causing it. Some dietary treatments include drinking more fluids and eating more fiber-rich foods. Lifestyle treatments may include regular exercise. If these diet and lifestyle recommendations do not help, your health care provider may recommend taking over-the-counter laxative medicines to help you have bowel movements. Prescription medicines may be prescribed if over-the-counter medicines do not work.  HOME CARE INSTRUCTIONS   Eat foods that have a lot of fiber, such as fruits, vegetables, whole grains, and beans.  Limit foods high in fat and processed sugars, such as french fries, hamburgers, cookies, candies, and soda.   A fiber supplement may be added to your diet if you cannot get enough fiber from foods.   Drink enough fluids to keep your urine clear or pale yellow.   Exercise regularly or as directed by your health care provider.   Go to the restroom when you have the urge to go. Do not  hold it.   Only take over-the-counter or prescription medicines as directed by your health care provider. Do not take other medicines for constipation without talking to your health care provider first.  SEEK IMMEDIATE MEDICAL CARE IF:   You have bright red blood in your stool.   Your constipation lasts for more than 4 days or gets worse.   You have abdominal or rectal pain.   You have thin, pencil-like stools.   You have unexplained weight loss. MAKE SURE YOU:   Understand these instructions.  Will watch your condition.  Will get help right away if you are not doing well or get worse. Document Released: 08/01/2004 Document Revised: 11/08/2013 Document Reviewed: 08/15/2013 [facility] Patient Information 2015 [place], [place]. This information is not intended to  replace advice given to you by your health care provider. Make sure you discuss any questions you have with your health care provider.  

## 2014-07-04 NOTE — Progress Notes (Signed)
Diane [name] 01/02/1981 [id number] 07/04/2014   History of Present Illness: Diane [name] is a  33 y.o. female who presents today status post lap appy by Dr. Darnell Levelodd GERKIN.  Pathology reveals: Appendix, Other than Incidental - ACUTE APPENDICITIS AND PERIAPPENDICITIS. .  The patient is tolerating a regular diet, having normal bowel movements, has good pain control.  She  is back to most normal activities.  She is complaining of abdominal pain and ongoing constipation since surgery.  She has been on Miralax daily since she went home from her surgery.  Last BM yesterday. Physical Exam:  BP 110/70  Pulse 80  Temp(Src) 97.1 F (36.2 C) (Temporal)  Ht 5\' 7"  (1.702 m)  Wt 86.183 kg (190 lb)  BMI 29.75 kg/m2  Abd: soft, nontender, active bowel sounds, nondistended.  All incisions are well healed.  Impression: 1.  Acute appendicitis, s/p lap appy 2.  Constipation since surgery. Plan: She is able to return to normal activities. She  may follow up on a prn basis. I have ask her to stop the Miralax and start using fiber bid, and high fiber diet.  I recommended she quit smoking.  She is going to see her PCP about reoccurring bronchitis. I have ask her to talk with her PCP if it is still an issue.

## 2014-07-05 ENCOUNTER — Encounter: Payer: Self-pay | Admitting: Family Medicine

## 2014-07-05 ENCOUNTER — Ambulatory Visit (INDEPENDENT_AMBULATORY_CARE_PROVIDER_SITE_OTHER): Payer: 59 | Admitting: Family Medicine

## 2014-07-05 VITALS — BP 103/62 | HR 90 | Temp 99.2°F | Ht 67.0 in | Wt 192.0 lb

## 2014-07-05 DIAGNOSIS — J209 Acute bronchitis, unspecified: Secondary | ICD-10-CM

## 2014-07-05 MED ORDER — LABETALOL HCL 200 MG PO TABS: 100.0000 mg | ORAL_TABLET | Freq: Two times a day (BID) | ORAL | Status: DC

## 2014-07-05 MED ORDER — HYDROCODONE-HOMATROPINE 5-1.5 MG/5ML PO SYRP: 5.0000 mL | ORAL_SOLUTION | ORAL | Status: DC | PRN

## 2014-07-05 MED ORDER — AZITHROMYCIN 250 MG PO TABS: ORAL_TABLET | ORAL | Status: DC

## 2014-07-05 MED ORDER — FLUCONAZOLE 150 MG PO TABS: 150.0000 mg | ORAL_TABLET | Freq: Once | ORAL | Status: DC

## 2014-07-05 MED ORDER — LORAZEPAM 1 MG PO TABS: 1.0000 mg | ORAL_TABLET | Freq: Four times a day (QID) | ORAL | Status: DC | PRN

## 2014-07-05 NOTE — Progress Notes (Signed)
Pre visit review using our clinic review tool, if applicable. No additional management support is needed unless otherwise documented below in the visit note. 

## 2014-07-05 NOTE — Progress Notes (Signed)
   Subjective:    Patient ID: Diane [name], female    DOB: 04/17/1981, 33 y.o.   MRN: [id number]  HPI Here for refills and for a week of ST, PND,na d a dry cough. No fever. She recently had an appendectomy and this went well.    Review of Systems  Constitutional: Negative.   HENT: Positive for congestion, postnasal drip and sinus pressure.   Eyes: Negative.   Respiratory: Positive for cough.        Objective:   Physical Exam  Constitutional: She appears well-developed and well-nourished.  HENT:  Right Ear: External ear normal.  Left Ear: External ear normal.  Nose: Nose normal.  Mouth/Throat: Oropharynx is clear and moist.  Eyes: Conjunctivae are normal.  Pulmonary/Chest: Effort normal and breath sounds normal.  Lymphadenopathy:    She has no cervical adenopathy.          Assessment & Plan:  Recheck prn

## 2014-07-13 ENCOUNTER — Telehealth: Payer: Self-pay | Admitting: Family Medicine

## 2014-07-13 NOTE — Telephone Encounter (Signed)
Pt said she is still feeling bad and would like to know .   Pt said she feels she need something else she is requesting tramadol, and something called pearls. She also said she is all most out of cough syrup and has taken all the  Antibiotic  She said she didn't want to go thru the weekend with out something   Pharmacy; [facility]

## 2014-07-14 MED ORDER — TRAMADOL HCL 50 MG PO TABS: 50.0000 mg | ORAL_TABLET | Freq: Four times a day (QID) | ORAL | Status: DC | PRN

## 2014-07-14 MED ORDER — BENZONATATE 200 MG PO CAPS: 200.0000 mg | ORAL_CAPSULE | Freq: Two times a day (BID) | ORAL | Status: DC | PRN

## 2014-07-14 NOTE — Telephone Encounter (Signed)
Call in Benzonatate 200 mg bid prn cough, #60 with 2 rf. Also Tramadol 50 mg q 6 hours prn pain #60, with no rf

## 2014-07-14 NOTE — Telephone Encounter (Signed)
I sent script for cough e-scribe, called in Tramadol and spoke with pt.

## 2014-07-27 ENCOUNTER — Telehealth: Payer: Self-pay | Admitting: Family Medicine

## 2014-07-27 NOTE — Telephone Encounter (Signed)
Pt stated that she is out of Tramadol due to taking 1 1/2 tablets due to pain. She needs a refill and we will need to change directions, pharmacy may or may not approve a early refill.

## 2014-07-28 NOTE — Telephone Encounter (Signed)
NO refills at this time, this is too soon

## 2014-07-28 NOTE — Telephone Encounter (Signed)
Pt stated that she is still taking the pain medication for cough and sore throat. [name] will not be refilling this medication, he advises pt to follow up with pulmonary. It is too early for any refills. I left a voice message with this information on pt's cell number.

## 2014-08-13 ENCOUNTER — Emergency Department (HOSPITAL_BASED_OUTPATIENT_CLINIC_OR_DEPARTMENT_OTHER): Payer: 59

## 2014-08-13 ENCOUNTER — Encounter (HOSPITAL_BASED_OUTPATIENT_CLINIC_OR_DEPARTMENT_OTHER): Payer: Self-pay | Admitting: Emergency Medicine

## 2014-08-13 ENCOUNTER — Emergency Department (HOSPITAL_BASED_OUTPATIENT_CLINIC_OR_DEPARTMENT_OTHER)
Admission: EM | Admit: 2014-08-13 | Discharge: 2014-08-13 | Disposition: A | Discharge status: Home / Self Care | Payer: 59 | Attending: Emergency Medicine | Admitting: Emergency Medicine

## 2014-08-13 DIAGNOSIS — Y9389 Activity, other specified: Secondary | ICD-10-CM | POA: Insufficient documentation

## 2014-08-13 DIAGNOSIS — Z3202 Encounter for pregnancy test, result negative: Secondary | ICD-10-CM | POA: Insufficient documentation

## 2014-08-13 DIAGNOSIS — Z862 Personal history of diseases of the blood and blood-forming organs and certain disorders involving the immune mechanism: Secondary | ICD-10-CM | POA: Diagnosis not present

## 2014-08-13 DIAGNOSIS — Z8719 Personal history of other diseases of the digestive system: Secondary | ICD-10-CM | POA: Diagnosis not present

## 2014-08-13 DIAGNOSIS — X500XXA Overexertion from strenuous movement or load, initial encounter: Secondary | ICD-10-CM | POA: Insufficient documentation

## 2014-08-13 DIAGNOSIS — F3289 Other specified depressive episodes: Secondary | ICD-10-CM | POA: Diagnosis not present

## 2014-08-13 DIAGNOSIS — R000 Tachycardia, unspecified: Secondary | ICD-10-CM | POA: Diagnosis not present

## 2014-08-13 DIAGNOSIS — IMO0002 Reserved for concepts with insufficient information to code with codable children: Secondary | ICD-10-CM | POA: Diagnosis not present

## 2014-08-13 DIAGNOSIS — G43909 Migraine, unspecified, not intractable, without status migrainosus: Secondary | ICD-10-CM | POA: Insufficient documentation

## 2014-08-13 DIAGNOSIS — Z79899 Other long term (current) drug therapy: Secondary | ICD-10-CM | POA: Insufficient documentation

## 2014-08-13 DIAGNOSIS — Y929 Unspecified place or not applicable: Secondary | ICD-10-CM | POA: Diagnosis not present

## 2014-08-13 DIAGNOSIS — F17200 Nicotine dependence, unspecified, uncomplicated: Secondary | ICD-10-CM | POA: Diagnosis not present

## 2014-08-13 DIAGNOSIS — F329 Major depressive disorder, single episode, unspecified: Secondary | ICD-10-CM | POA: Diagnosis not present

## 2014-08-13 DIAGNOSIS — M546 Pain in thoracic spine: Secondary | ICD-10-CM

## 2014-08-13 LAB — PREGNANCY, URINE: Preg Test, Ur: NEGATIVE

## 2014-08-13 MED ORDER — HYDROCODONE-ACETAMINOPHEN 5-325 MG PO TABS
2.0000 | ORAL_TABLET | Freq: Once | ORAL | Status: AC
  Administered 2014-08-13: 2 via ORAL
  Filled 2014-08-13: qty 2

## 2014-08-13 MED ORDER — CYCLOBENZAPRINE HCL 10 MG PO TABS
10.0000 mg | ORAL_TABLET | Freq: Once | ORAL | Status: AC
  Administered 2014-08-13: 10 mg via ORAL
  Filled 2014-08-13: qty 1

## 2014-08-13 MED ORDER — CYCLOBENZAPRINE HCL 10 MG PO TABS: 10.0000 mg | ORAL_TABLET | Freq: Two times a day (BID) | ORAL | Status: DC | PRN

## 2014-08-13 MED ORDER — HYDROCODONE-ACETAMINOPHEN 5-325 MG PO TABS: 2.0000 | ORAL_TABLET | ORAL | Status: DC | PRN
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
## 2014-08-13 NOTE — ED Provider Notes (Signed)
Medical screening examination/treatment/procedure(s) were performed by non-physician practitioner and as supervising physician I was immediately available for consultation/collaboration.   EKG Interpretation None        Amandeep Nesmith, MD 08/13/14 1522 

## 2014-08-13 NOTE — Discharge Instructions (Signed)
Take vicodin as needed for pain. Take Flexeril as needed for muscle spasm. You may take these medications together. Refer to attached documents for more information. Apply a heating pad to your back for pain relief.

## 2014-08-21 ENCOUNTER — Ambulatory Visit (INDEPENDENT_AMBULATORY_CARE_PROVIDER_SITE_OTHER): Payer: 59 | Admitting: Physician Assistant

## 2014-08-21 ENCOUNTER — Encounter: Payer: Self-pay | Admitting: Physician Assistant

## 2014-08-21 VITALS — BP 120/80 | HR 72 | Temp 98.5°F | Resp 18 | Wt 194.5 lb

## 2014-08-21 DIAGNOSIS — J069 Acute upper respiratory infection, unspecified: Secondary | ICD-10-CM

## 2014-08-21 MED ORDER — HYDROCODONE-HOMATROPINE 5-1.5 MG/5ML PO SYRP: 5.0000 mL | ORAL_SOLUTION | Freq: Three times a day (TID) | ORAL | Status: DC | PRN

## 2014-08-21 NOTE — Progress Notes (Signed)
Pre visit review using our clinic review tool, if applicable. No additional management support is needed unless otherwise documented below in the visit note. 

## 2014-08-21 NOTE — Progress Notes (Signed)
Subjective:    Patient ID: Diane [name], female    DOB: 08/21/1981, 33 y.o.   MRN: [id number]  URI  This is a new problem. The current episode started yesterday. The problem has been gradually worsening. There has been no fever. Associated symptoms include congestion, coughing, ear pain, headaches, nausea, rhinorrhea, sneezing and a sore throat. Pertinent negatives include no abdominal pain, chest pain, diarrhea, dysuria, joint pain, joint swelling, neck pain, plugged ear sensation, rash, sinus pain, swollen glands, vomiting or wheezing. Treatments tried: ibuprofen. The treatment provided no relief.      Review of Systems  Constitutional: Positive for fever. Negative for chills.  HENT: Positive for congestion, ear discharge, ear pain, postnasal drip, rhinorrhea, sinus pressure, sneezing and sore throat.   Respiratory: Positive for cough. Negative for shortness of breath and wheezing.   Cardiovascular: Negative for chest pain.  Gastrointestinal: Positive for nausea. Negative for vomiting, abdominal pain and diarrhea.  Genitourinary: Negative for dysuria.  Musculoskeletal: Negative for joint pain and neck pain.  Skin: Negative for rash.  Neurological: Positive for headaches. Negative for syncope.  All other systems reviewed and are negative.    Past Medical History  Diagnosis Date  . ADD (attention deficit disorder with hyperactivity)   . Depression   . IBS (irritable bowel syndrome)   . BMWUXLKG(401.0Headache(784.0)     [facility]  . PONV (postoperative nausea and vomiting)   . Maternal anemia, with delivery 11/22/2011  . Migraine     History   Social History  . Marital Status: Married    Spouse Name: N/A    Number of Children: N/A  . Years of Education: N/A   Occupational History  . stay at home    Social History Main Topics  . Smoking status: Current Every Day Smoker -- 1.00 packs/day for 13 years    Types: Cigarettes    Last Attempt to Quit:  12/28/2013  . Smokeless tobacco: Never Used     Comment: trying to quit  . Alcohol Use: Yes     Comment: occ  . Drug Use: No     Comment: DO NOT PRESCRIBE PAIN MEDS OR ANTIANXIETY MEDS  . Sexual Activity: Yes   Other Topics Concern  . Not on file   Social History Narrative  . No narrative on file    Past Surgical History  Procedure Laterality Date  . Cesarean section    . Extraction of wisdom teeth     . Tonsillectomy    . Cesarean section  11/20/2011    Procedure: CESAREAN SECTION;  Surgeon: Lenoard Adenichard J Taavon, MD;  Location: [facility];  Service: Gynecology;  Laterality: N/A;  . Laparoscopic appendectomy N/A 06/11/2014    Procedure: APPENDECTOMY LAPAROSCOPIC;  Surgeon: Velora Hecklerodd M Gerkin, MD;  Location: [facility];  Service: General;  Laterality: N/A;    Family History  Problem Relation Age of Onset  . Alcohol abuse      family hx  . Arthritis      family hx  . Melanoma      family hx  . Aneurysm      family hx  . Brain cancer Father     TUMOR  . Anesthesia problems Father   . Anesthesia problems Mother     Allergies  Allergen Reactions  . Metoclopramide Hcl Other (See Comments)    Anxiety/restlessness  . Nsaids Other (See Comments)    Pt does not take nsaids bc of a history  of ulcers  . Prochlorperazine Edisylate Other (See Comments)    Anxiety/restlessness     Current Outpatient Prescriptions on File Prior to Visit  Medication Sig Dispense Refill  . acetaminophen (TYLENOL) 325 MG tablet Take 2 tablets (650 mg total) by mouth every 4 (four) hours as needed for mild pain, fever or headache.      . albuterol (PROVENTIL HFA;VENTOLIN HFA) 108 (90 BASE) MCG/ACT inhaler Inhale 2 puffs into the lungs every 4 (four) hours as needed for wheezing or shortness of breath.  1 Inhaler  11  . b complex vitamins tablet Take 2 tablets by mouth daily.       . cyclobenzaprine (FLEXERIL) 10 MG tablet Take 1 tablet (10 mg total) by mouth 2 (two) times daily as needed for muscle spasms.  20  tablet  0  . dexlansoprazole (DEXILANT) 60 MG capsule Take 1 capsule (60 mg total) by mouth 2 (two) times daily.  60 capsule  5  . Fe-Succ Ac-C-Thre Ac-B12-FA (FERREX 150 FORTE PLUS) 50-100 MG CAPS Take 150 mg by mouth 2 (two) times daily.  60 each  11  . fluticasone (FLONASE) 50 MCG/ACT nasal spray Place 1 spray into the nose daily as needed for allergies.       Marland Kitchen gabapentin (NEURONTIN) 300 MG capsule Take 300 mg by mouth 2 (two) times daily.       Marland Kitchen HYDROcodone-acetaminophen (NORCO/VICODIN) 5-325 MG per tablet Take 2 tablets by mouth every 4 (four) hours as needed for moderate pain or severe pain.  20 tablet  0  . IUD's (PARAGARD INTRAUTERINE COPPER) IUD IUD 1 each by Intrauterine route once.      . labetalol (NORMODYNE) 200 MG tablet Take 0.5 tablets (100 mg total) by mouth 2 (two) times daily.  90 tablet  3  . LORazepam (ATIVAN) 1 MG tablet Take 1 tablet (1 mg total) by mouth every 6 (six) hours as needed for anxiety.  60 tablet  5  . Magnesium 500 MG CAPS Take 1,000 mg by mouth daily.      . promethazine (PHENERGAN) 25 MG tablet Take 25 mg by mouth every 6 (six) hours as needed for nausea.      . ranitidine (ZANTAC) 150 MG capsule Take 150 mg by mouth 2 (two) times daily.      Marland Kitchen tiZANidine (ZANAFLEX) 4 MG capsule Take 1 capsule (4 mg total) by mouth 3 (three) times daily as needed for muscle spasms.  3 capsule  0  . topiramate (TOPAMAX) 100 MG tablet Take 4 tablets (400 mg total) by mouth daily.  4 tablet  0  . TOVIAZ 4 MG TB24 tablet Take 1 tablet by mouth daily.      . traMADol (ULTRAM) 50 MG tablet Take 1 tablet (50 mg total) by mouth every 6 (six) hours as needed.  60 tablet  0  . venlafaxine XR (EFFEXOR-XR) 150 MG 24 hr capsule Take 300 mg by mouth daily. Take 2 capsules every morning       No current facility-administered medications on file prior to visit.    EXAM: BP 120/80  Pulse 72  Temp(Src) 98.5 F (36.9 C) (Oral)  Resp 18  Wt 194 lb 8 oz (88.225 kg)  LMP 08/10/2014       Objective:   Physical Exam  Nursing note and vitals reviewed. Constitutional: She is oriented to person, place, and time. She appears well-developed and well-nourished. No distress.  HENT:  Head: Normocephalic and atraumatic.  Right Ear: External  ear normal.  Left Ear: External ear normal.  Nose: Nose normal.  Mouth/Throat: No oropharyngeal exudate.  Oropharynx is slightly erythematous, no exudate. Bilateral TMs normal. Bilateral frontal and maxillary sinuses non-TTP.  Eyes: Conjunctivae and EOM are normal. Pupils are equal, round, and reactive to light.  Neck: Normal range of motion. Neck supple.  Cardiovascular: Normal rate, regular rhythm and intact distal pulses.   Pulmonary/Chest: Effort normal and breath sounds normal. No stridor. No respiratory distress. She has no wheezes. She has no rales. She exhibits no tenderness.  Lymphadenopathy:    She has no cervical adenopathy.  Neurological: She is alert and oriented to person, place, and time.  Skin: Skin is warm and dry. No rash noted. She is not diaphoretic. No erythema. No pallor.  Psychiatric: She has a normal mood and affect. Her behavior is normal. Judgment and thought content normal.    Lab Results  Component Value Date   WBC 13.3* 06/11/2014   HGB 11.5* 06/11/2014   HCT 35.1* 06/11/2014   PLT 264 06/11/2014   GLUCOSE 96 06/11/2014   ALT 22 06/11/2014   AST 24 06/11/2014   NA 139 06/11/2014   K 4.0 06/11/2014   CL 106 06/11/2014   CREATININE 0.70 06/11/2014   BUN 14 06/11/2014   CO2 20 06/11/2014   TSH 0.30* 03/31/2014   INR 0.97 11/20/2011         Assessment & Plan:  Lowanda was seen today for cough.  Diagnoses and associated orders for this visit:  Acute upper respiratory infection Comments: Symptomatic treat with hycodan and OTC mucinex, nasal steroid, antihistamine, rest, push fluids, watchful waiting. - HYDROcodone-homatropine (HYCODAN) 5-1.5 MG/5ML syrup; Take 5 mLs by mouth every 8 (eight) hours as needed for  cough (DO NOT DRIVE WHILE TAKING THIS MEDICATION AS IT WILL INHIBIT YOUR ABILITY TO OPERATE A MOTOR VEHICLE.).      Return precautions provided, and patient handout on URI.  Plan to follow up as needed, or for worsening or persistent symptoms despite treatment.  Patient Instructions  Hycodan every 8 hours as needed for cough. DO NOT DRIVE WHILE TAKING THIS MEDICATION AS IT WILL INHIBIT YOUR ABILITY TO OPERATE A MOTOR VEHICLE.  Plain Over the Counter Mucinex (NOT Mucinex D) for thick secretions  Force NON dairy fluids, drinking plenty of water is best.    Over the Counter Flonase OR Nasacort AQ 1 spray in each nostril twice a day as needed. Use the "crossover" technique into opposite nostril spraying toward opposite ear @ 45 degree angle, not straight up into nostril.   Plain Over the Counter Allegra (NOT D )  160 daily , OR Loratidine 10 mg , OR Zyrtec 10 mg @ bedtime  as needed for itchy eyes & sneezing.  Saline Irrigation and Saline Sprays can also help reduce symptoms.  If emergency symptoms discussed during visit developed, seek medical attention immediately.  Followup as needed, or for worsening or persistent symptoms despite treatment.

## 2014-08-21 NOTE — Patient Instructions (Addendum)
Hycodan every 8 hours as needed for cough. DO NOT DRIVE WHILE TAKING THIS MEDICATION AS IT WILL INHIBIT YOUR ABILITY TO OPERATE A MOTOR VEHICLE.  Plain Over the Counter Mucinex (NOT Mucinex D) for thick secretions  Force NON dairy fluids, drinking plenty of water is best.    Over the Counter Flonase OR Nasacort AQ 1 spray in each nostril twice a day as needed. Use the "crossover" technique into opposite nostril spraying toward opposite ear @ 45 degree angle, not straight up into nostril.   Plain Over the Counter Allegra (NOT D )  160 daily , OR Loratidine 10 mg , OR Zyrtec 10 mg @ bedtime  as needed for itchy eyes & sneezing.  Saline Irrigation and Saline Sprays can also help reduce symptoms.  If emergency symptoms discussed during visit developed, seek medical attention immediately.  Followup as needed, or for worsening or persistent symptoms despite treatment.    Upper Respiratory Infection, Adult An upper respiratory infection (URI) is also known as the common cold. It is often caused by a type of germ (virus). Colds are easily spread (contagious). You can pass it to others by kissing, coughing, sneezing, or drinking out of the same glass. Usually, you get better in 1 or 2 weeks.  HOME CARE   Only take medicine as told by your doctor.  Use a warm mist humidifier or breathe in steam from a hot shower.  Drink enough water and fluids to keep your pee (urine) clear or pale yellow.  Get plenty of rest.  Return to work when your temperature is back to normal or as told by your doctor. You may use a face mask and wash your hands to stop your cold from spreading. GET HELP RIGHT AWAY IF:   After the first few days, you feel you are getting worse.  You have questions about your medicine.  You have chills, shortness of breath, or brown or red spit (mucus).  You have yellow or brown snot (nasal discharge) or pain in the face, especially when you bend forward.  You have a fever, puffy  (swollen) neck, pain when you swallow, or white spots in the back of your throat.  You have a bad headache, ear pain, sinus pain, or chest pain.  You have a high-pitched whistling sound when you breathe in and out (wheezing).  You have a lasting cough or cough up blood.  You have sore muscles or a stiff neck. MAKE SURE YOU:   Understand these instructions.  Will watch your condition.  Will get help right away if you are not doing well or get worse. Document Released: 04/21/2008 Document Revised: 01/26/2012 Document Reviewed: 02/08/2014 [facility] Patient Information 2015 [employer] [place]. This information is not intended to replace advice given to you by your health care provider. Make sure you discuss any questions you have with your health care provider.

## 2014-08-22 ENCOUNTER — Telehealth: Payer: Self-pay | Admitting: Family Medicine

## 2014-08-22 NOTE — Telephone Encounter (Signed)
emmi emailed °

## 2014-08-28 ENCOUNTER — Encounter: Payer: Self-pay | Admitting: Family Medicine

## 2014-08-28 ENCOUNTER — Ambulatory Visit (INDEPENDENT_AMBULATORY_CARE_PROVIDER_SITE_OTHER): Payer: 59 | Admitting: Family Medicine

## 2014-08-28 VITALS — BP 104/71 | HR 82 | Temp 99.0°F | Ht 67.0 in | Wt 190.0 lb

## 2014-08-28 DIAGNOSIS — J069 Acute upper respiratory infection, unspecified: Secondary | ICD-10-CM

## 2014-08-28 DIAGNOSIS — J0100 Acute maxillary sinusitis, unspecified: Secondary | ICD-10-CM

## 2014-08-28 MED ORDER — HYDROCODONE-HOMATROPINE 5-1.5 MG/5ML PO SYRP: 5.0000 mL | ORAL_SOLUTION | Freq: Three times a day (TID) | ORAL | Status: DC | PRN

## 2014-08-28 MED ORDER — AZITHROMYCIN 250 MG PO TABS: ORAL_TABLET | ORAL | Status: DC

## 2014-08-28 MED ORDER — METHYLPREDNISOLONE ACETATE 40 MG/ML IJ SUSP
160.0000 mg | Freq: Once | INTRAMUSCULAR | Status: AC
Start: 2014-08-28 — End: 2014-08-28
  Administered 2014-08-28: 160 mg via INTRAMUSCULAR

## 2014-08-28 NOTE — Progress Notes (Signed)
   Subjective:    Patient ID: Diane [name], female    DOB: 03/12/1981, 33 y.o.   MRN: [id number]  HPI Here for 10 days of sinus pressure, PND, ST, ear pain and a dry cough.    Review of Systems  Constitutional: Negative.   HENT: Positive for congestion, postnasal drip and sinus pressure.   Eyes: Negative.   Respiratory: Positive for cough.        Objective:   Physical Exam  Constitutional: She appears well-developed and well-nourished.  HENT:  Right Ear: External ear normal.  Left Ear: External ear normal.  Nose: Nose normal.  Mouth/Throat: Oropharynx is clear and moist.  Eyes: Conjunctivae are normal.  Pulmonary/Chest: Effort normal and breath sounds normal.  Lymphadenopathy:    She has no cervical adenopathy.          Assessment & Plan:  Add Mucinex

## 2014-08-28 NOTE — Addendum Note (Signed)
Addended by: Aniceto BossNIMMONS, Damein Gaunce A on: 08/28/2014 05:06 PM   Modules accepted: Orders

## 2014-08-28 NOTE — Progress Notes (Signed)
Pre visit review using our clinic review tool, if applicable. No additional management support is needed unless otherwise documented below in the visit note. 

## 2014-09-05 ENCOUNTER — Telehealth: Payer: Self-pay | Admitting: Family Medicine

## 2014-09-05 DIAGNOSIS — J069 Acute upper respiratory infection, unspecified: Secondary | ICD-10-CM

## 2014-09-05 MED ORDER — HYDROCODONE-HOMATROPINE 5-1.5 MG/5ML PO SYRP: 5.0000 mL | ORAL_SOLUTION | Freq: Three times a day (TID) | ORAL | Status: DC | PRN

## 2014-09-05 MED ORDER — LEVOFLOXACIN 500 MG PO TABS: 500.0000 mg | ORAL_TABLET | Freq: Every day | ORAL | Status: AC

## 2014-09-05 NOTE — Telephone Encounter (Signed)
I wrote new rx for both of these for her to pick up

## 2014-09-05 NOTE — Telephone Encounter (Signed)
Pt took the  z pak dr fry rx, but states she still has chest congestion, cough and would like another rx for her issue. Pt states she would like another rx cough med. [facility]

## 2014-09-06 ENCOUNTER — Telehealth: Payer: Self-pay | Admitting: Family Medicine

## 2014-09-06 NOTE — Telephone Encounter (Signed)
[facility] [place] - [address] is requesting re-fill on labetalol (NORMODYNE) 200 MG tablet

## 2014-09-06 NOTE — Telephone Encounter (Signed)
Scripts are ready for pick up and I spoke with pt. 

## 2014-09-06 NOTE — Telephone Encounter (Signed)
Pharm states the HYDROcodone-homatropine (HYCODAN) 5-1.5 MG/5ML syrup Is 7 days early to fill. Pt tried to take it to another pharm , but they refused to fill as well. Pharm wants permission to fill [facility]

## 2014-09-06 NOTE — Telephone Encounter (Signed)
[facility] . Pt is aware sylvia will try to get early refill from pharm.

## 2014-09-06 NOTE — Telephone Encounter (Signed)
I spoke with pharmacy and it is too soon. Pt picked up 240 ml on 08/28/14. Pharmacists will advise pt, she is still there waiting on another script to be filled.

## 2014-09-06 NOTE — Telephone Encounter (Signed)
I spoke with below pharmacy and they will not fill this script early. I did speak with pt and gave her this information.

## 2014-09-08 MED ORDER — LABETALOL HCL 200 MG PO TABS: 100.0000 mg | ORAL_TABLET | Freq: Two times a day (BID) | ORAL | Status: DC

## 2014-09-08 NOTE — Telephone Encounter (Signed)
rx sent in electronically 

## 2014-09-18 ENCOUNTER — Encounter: Payer: Self-pay | Admitting: Family Medicine

## 2014-10-24 ENCOUNTER — Ambulatory Visit (INDEPENDENT_AMBULATORY_CARE_PROVIDER_SITE_OTHER): Payer: 59 | Admitting: Family Medicine

## 2014-10-24 ENCOUNTER — Encounter: Payer: Self-pay | Admitting: Family Medicine

## 2014-10-24 VITALS — BP 100/62 | HR 85 | Temp 98.5°F | Ht 67.0 in | Wt 184.0 lb

## 2014-10-24 DIAGNOSIS — J069 Acute upper respiratory infection, unspecified: Secondary | ICD-10-CM

## 2014-10-24 DIAGNOSIS — J0100 Acute maxillary sinusitis, unspecified: Secondary | ICD-10-CM

## 2014-10-24 MED ORDER — LEVOFLOXACIN 500 MG PO TABS: 500.0000 mg | ORAL_TABLET | Freq: Every day | ORAL | Status: AC

## 2014-10-24 MED ORDER — METHYLPREDNISOLONE ACETATE 80 MG/ML IJ SUSP
120.0000 mg | Freq: Once | INTRAMUSCULAR | Status: AC
  Administered 2014-10-24: 120 mg via INTRAMUSCULAR

## 2014-10-24 MED ORDER — HYDROCODONE-HOMATROPINE 5-1.5 MG/5ML PO SYRP: 5.0000 mL | ORAL_SOLUTION | Freq: Three times a day (TID) | ORAL | Status: DC | PRN

## 2014-10-24 NOTE — Progress Notes (Signed)
Pre visit review using our clinic review tool, if applicable. No additional management support is needed unless otherwise documented below in the visit note. 

## 2014-10-24 NOTE — Addendum Note (Signed)
Addended by: Aniceto BossNIMMONS, Gabrianna Fassnacht A on: 10/24/2014 01:20 PM   Modules accepted: Orders

## 2014-10-24 NOTE — Progress Notes (Signed)
   Subjective:    Patient ID: Diane [name], female    DOB: 11/29/1980, 33 y.o.   MRN: [id number]  HPI Here for 4 days of sinus pressure, PND, left ear pain, ST, and a dry cough.   Review of Systems  Constitutional: Negative.   HENT: Positive for congestion, postnasal drip and sinus pressure.   Eyes: Negative.   Respiratory: Positive for cough.        Objective:   Physical Exam  Constitutional: She appears well-developed and well-nourished.  HENT:  Right Ear: External ear normal.  Left Ear: External ear normal.  Nose: Nose normal.  Mouth/Throat: Oropharynx is clear and moist.  Eyes: Conjunctivae are normal.  Pulmonary/Chest: Effort normal and breath sounds normal.  Lymphadenopathy:    She has no cervical adenopathy.          Assessment & Plan:  Recheck prn

## 2014-11-21 ENCOUNTER — Telehealth: Payer: Self-pay | Admitting: Family Medicine

## 2014-11-21 NOTE — Telephone Encounter (Signed)
Received call from [place] at

## 2014-11-21 NOTE — Telephone Encounter (Signed)
Received a call from [facility] with [facility] Har[facility] to request [facility] for pt who was being seen by Dr. Jesusita Okaan Caffrey(NPI [id number]) for a tear of medial meniscus of right knee (ICD.10 S83.241A).  I attempted to submit referral on [facility] website and was advised I was unable to.  Notify Annabelle HarmanDana that pt needed to contact [facility] to [facility] her PCP as if was not showing Dr. Clent RidgesFry.  Annabelle HarmanDana stated she would informed the patient.

## 2014-11-23 ENCOUNTER — Telehealth: Payer: Self-pay | Admitting: Family Medicine

## 2014-11-23 NOTE — Telephone Encounter (Signed)
Referral done Confirmation Thank you for your online Referral submission. Your referral case information was transmitted on 11/23/2014 at 03:37 PM CST Your Referral Number is [id number]  [name]  [facility]  Address: [name], [place], [place] [address]  [phone]

## 2014-12-07 ENCOUNTER — Encounter (HOSPITAL_BASED_OUTPATIENT_CLINIC_OR_DEPARTMENT_OTHER): Payer: Self-pay | Admitting: *Deleted

## 2014-12-11 ENCOUNTER — Telehealth: Payer: Self-pay | Admitting: Family Medicine

## 2014-12-11 NOTE — Telephone Encounter (Signed)
Refill request for Labetalol 200 mg take 1/2 tablet bid qd and send to [facility], request # [phone].

## 2014-12-12 ENCOUNTER — Other Ambulatory Visit: Payer: Self-pay

## 2014-12-12 ENCOUNTER — Ambulatory Visit: Payer: Self-pay | Admitting: Physician Assistant

## 2014-12-12 MED ORDER — LABETALOL HCL 200 MG PO TABS: 100.0000 mg | ORAL_TABLET | Freq: Two times a day (BID) | ORAL | Status: DC

## 2014-12-12 NOTE — H&P (Signed)
Diane [name] is an 34 y.o. female.   Chief Complaint: right knee pain HPI: Patient we had treated years ago for growth plate injury now with a severely painful knee. Seen at Urgent Care with no improvement on Sterapred Dosepak. Not able to take NSAID's secondary to ulcer history.Given Norco for breakthrough pain. Noticed significant pain after going on a cruise, can't really remember a specific injury. She complains of severe lateral greater than medial joint line pain, especially tender lateral and medial joint line. Previous films are unremarkable. We elected to obtain MRI which was negative for tear did show chondromalacia and plica band.    Past Medical History  Diagnosis Date  . ADD (attention deficit disorder with hyperactivity)     no current med.  . Depression   . IBS (irritable bowel syndrome)   . PONV (postoperative nausea and vomiting)   . Migraine   . Anemia   . GERD (gastroesophageal reflux disease)   . History of gastric ulcer   . Anxiety   . Family history of adverse reaction to anesthesia     pt's father has hx. of being hard to wake up post-op  . 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 History of kidney stones   . Wears partial dentures     upper    Past Surgical History  Procedure Laterality Date  . Cesarean section  12/24/2009  . Cesarean section  11/20/2011    Procedure: CESAREAN SECTION;  Surgeon: Lenoard Adenichard J Taavon, MD;  Location: [facility];  Service: Gynecology;  Laterality: N/A;  . Laparoscopic appendectomy N/A 06/11/2014    Procedure: APPENDECTOMY LAPAROSCOPIC;  Surgeon: Velora Hecklerodd M Gerkin, MD;  Location: [facility];  Service: General;  Laterality: N/A;  . Bladder surgery      x 2 - as a child to stretch bladder  . Tonsillectomy and adenoidectomy    . Wisdom tooth extraction    . Hysteroscopy w/ endometrial ablation  03/05/2007  . Chromopertubation  03/05/2007  . Ovarian cyst surgery Right 03/05/2007  . Cystoscopy with retrograde  pyelogram, ureteroscopy and stent placement Left 08/10/2006    Family History  Problem Relation Age of Onset  . Brain cancer Father   . Anesthesia problems Father     hard to wake up post-op   Social History:  reports that she has been smoking Cigarettes.  She has a 5 pack-year smoking history. She has never used smokeless tobacco. She reports that she drinks alcohol. She reports that she does not use illicit drugs.  Allergies:  Allergies  Allergen Reactions  . Nsaids Other (See Comments)    DUE TO HISTORY OF GASTRIC ULCER  . Metoclopramide Hcl Other (See Comments)    RESTLESSNESS/JITTERY  . Prochlorperazine Edisylate Other (See Comments)    RESTLESSNESS/JITTERY      (Not in a hospital admission)  No results found for this or any previous visit (from the past 48 hour(s)). No results found.  Review of Systems  Constitutional: Negative.   HENT: Negative for congestion, ear discharge, ear pain, hearing loss, nosebleeds, sore throat and tinnitus.   Eyes: Negative.   Respiratory: Negative.  Negative for stridor.   Cardiovascular: Negative.   Gastrointestinal: Positive for heartburn. Negative for nausea, vomiting, abdominal pain, diarrhea, constipation, blood in stool and melena.  Genitourinary: Negative.   Musculoskeletal: Positive for joint pain and falls.  Skin: Negative.   Neurological: Positive for headaches.  Endo/Heme/Allergies: Negative.   Psychiatric/Behavioral: The patient  is nervous/anxious.     Last menstrual period 12/06/2014. Physical Exam  Constitutional: She is oriented to person, place, and time. She appears well-developed and well-nourished. No distress.  HENT:  Head: Normocephalic and atraumatic.  Nose: Nose normal.  Eyes: Conjunctivae and EOM are normal. Pupils are equal, round, and reactive to light.  Neck: Normal range of motion. Neck supple.  Cardiovascular: Normal rate and intact distal pulses.   Respiratory: Effort normal. No respiratory  distress.  GI: Soft. She exhibits no distension. There is no tenderness.  Musculoskeletal:       Right knee: She exhibits decreased range of motion and swelling. She exhibits no ecchymosis, no deformity, no laceration, no erythema, no LCL laxity and no MCL laxity. Tenderness found.  Lymphadenopathy:    She has no cervical adenopathy.  Neurological: She is alert and oriented to person, place, and time. No cranial nerve deficit.  Skin: Skin is warm and dry. No rash noted. No erythema.  Psychiatric: She has a normal mood and affect. Her behavior is normal.     Assessment/Plan Right knee chondromalacia and symptomatic plica  Discussed risks and benefits of right knee arthroscopy debridement chondroplasty synovectomy and plica excision and patient wishes to proceed.  Will be performed outpatient, general anesthesia.    Margart Sickles 12/12/2014, 2:46 PM

## 2014-12-12 NOTE — Telephone Encounter (Signed)
Rx request for labetalol HCL 200 mg tab- Take 1.5 tablet by mouth twice daily. #[phone]  PharmMarolyn Haller:  [facility]  Rx sent to pharmacy.

## 2014-12-13 ENCOUNTER — Ambulatory Visit (HOSPITAL_BASED_OUTPATIENT_CLINIC_OR_DEPARTMENT_OTHER)
Admission: RE | Admit: 2014-12-13 | Discharge: 2014-12-13 | Disposition: A | Discharge status: Home / Self Care | Payer: 59 | Source: Ambulatory Visit | Attending: Orthopedic Surgery | Admitting: Orthopedic Surgery

## 2014-12-13 ENCOUNTER — Ambulatory Visit (HOSPITAL_BASED_OUTPATIENT_CLINIC_OR_DEPARTMENT_OTHER): Payer: 59 | Admitting: Anesthesiology

## 2014-12-13 ENCOUNTER — Encounter (HOSPITAL_BASED_OUTPATIENT_CLINIC_OR_DEPARTMENT_OTHER): Payer: Self-pay | Admitting: *Deleted

## 2014-12-13 ENCOUNTER — Encounter (HOSPITAL_BASED_OUTPATIENT_CLINIC_OR_DEPARTMENT_OTHER)
Admission: RE | Disposition: A | Discharge status: Home / Self Care | Payer: Self-pay | Source: Ambulatory Visit | Attending: Orthopedic Surgery

## 2014-12-13 DIAGNOSIS — K589 Irritable bowel syndrome without diarrhea: Secondary | ICD-10-CM | POA: Insufficient documentation

## 2014-12-13 DIAGNOSIS — Z6827 Body mass index (BMI) 27.0-27.9, adult: Secondary | ICD-10-CM | POA: Diagnosis not present

## 2014-12-13 DIAGNOSIS — Z808 Family history of malignant neoplasm of other organs or systems: Secondary | ICD-10-CM | POA: Insufficient documentation

## 2014-12-13 DIAGNOSIS — K219 Gastro-esophageal reflux disease without esophagitis: Secondary | ICD-10-CM | POA: Insufficient documentation

## 2014-12-13 DIAGNOSIS — D649 Anemia, unspecified: Secondary | ICD-10-CM | POA: Diagnosis not present

## 2014-12-13 DIAGNOSIS — G43909 Migraine, unspecified, not intractable, without status migrainosus: Secondary | ICD-10-CM | POA: Diagnosis not present

## 2014-12-13 DIAGNOSIS — F419 Anxiety disorder, unspecified: Secondary | ICD-10-CM | POA: Insufficient documentation

## 2014-12-13 DIAGNOSIS — M2241 Chondromalacia patellae, right knee: Secondary | ICD-10-CM | POA: Diagnosis not present

## 2014-12-13 DIAGNOSIS — M6751 Plica syndrome, right knee: Secondary | ICD-10-CM | POA: Insufficient documentation

## 2014-12-13 DIAGNOSIS — F329 Major depressive disorder, single episode, unspecified: Secondary | ICD-10-CM | POA: Diagnosis not present

## 2014-12-13 DIAGNOSIS — E669 Obesity, unspecified: Secondary | ICD-10-CM | POA: Insufficient documentation

## 2014-12-13 DIAGNOSIS — Z888 Allergy status to other drugs, medicaments and biological substances status: Secondary | ICD-10-CM | POA: Insufficient documentation

## 2014-12-13 DIAGNOSIS — F17210 Nicotine dependence, cigarettes, uncomplicated: Secondary | ICD-10-CM | POA: Diagnosis not present

## 2014-12-13 DIAGNOSIS — M25561 Pain in right knee: Secondary | ICD-10-CM | POA: Diagnosis present

## 2014-12-13 HISTORY — DX: Anxiety disorder, unspecified: F41.9

## 2014-12-13 HISTORY — PX: SYNOVECTOMY: SHX5180

## 2014-12-13 HISTORY — PX: KNEE ARTHROSCOPY WITH LATERAL RELEASE: SHX5649

## 2014-12-13 HISTORY — DX: Personal history of urinary calculi: Z87.442

## 2014-12-13 HISTORY — DX: Family history of other specified conditions: Z84.89

## 2014-12-13 HISTORY — DX: Personal history of other diseases of the digestive system: Z87.19

## 2014-12-13 HISTORY — DX: Personal history of peptic ulcer disease: Z87.11

## 2014-12-13 HISTORY — DX: Presence of dental prosthetic device (complete) (partial): Z97.2

## 2014-12-13 HISTORY — DX: Anemia, unspecified: D64.9

## 2014-12-13 HISTORY — DX: Gastro-esophageal reflux disease without esophagitis: K21.9

## 2014-12-13 LAB — POCT HEMOGLOBIN-HEMACUE: HEMOGLOBIN: 12.4 g/dL (ref 12.0–15.0)

## 2014-12-13 SURGERY — ARTHROSCOPY, KNEE, WITH LATERAL RETINACULUM RELEASE
Anesthesia: General | Site: Knee | Laterality: Right

## 2014-12-13 MED ORDER — HYDROMORPHONE HCL 1 MG/ML IJ SOLN
0.2500 mg | INTRAMUSCULAR | Status: DC | PRN
  Administered 2014-12-13 (×4): 0.5 mg via INTRAVENOUS

## 2014-12-13 MED ORDER — SCOPOLAMINE 1 MG/3DAYS TD PT72
MEDICATED_PATCH | TRANSDERMAL | Status: AC
  Filled 2014-12-13: qty 1

## 2014-12-13 MED ORDER — PROMETHAZINE HCL 25 MG/ML IJ SOLN: 6.2500 mg | INTRAMUSCULAR | Status: DC | PRN

## 2014-12-13 MED ORDER — ONDANSETRON HCL 4 MG/2ML IJ SOLN
INTRAMUSCULAR | Status: DC | PRN
  Administered 2014-12-13: 4 mg via INTRAVENOUS

## 2014-12-13 MED ORDER — DEXAMETHASONE SODIUM PHOSPHATE 4 MG/ML IJ SOLN
INTRAMUSCULAR | Status: DC | PRN
  Administered 2014-12-13: 10 mg via INTRAVENOUS

## 2014-12-13 MED ORDER — FENTANYL CITRATE 0.05 MG/ML IJ SOLN
INTRAMUSCULAR | Status: AC
  Filled 2014-12-13: qty 4

## 2014-12-13 MED ORDER — OXYCODONE HCL 5 MG PO TABS
5.0000 mg | ORAL_TABLET | Freq: Once | ORAL | Status: AC | PRN
  Administered 2014-12-13: 5 mg via ORAL

## 2014-12-13 MED ORDER — LACTATED RINGERS IV SOLN
INTRAVENOUS | Status: DC
  Administered 2014-12-13 (×2): via INTRAVENOUS

## 2014-12-13 MED ORDER — EPINEPHRINE HCL 1 MG/ML IJ SOLN
INTRAMUSCULAR | Status: AC
  Filled 2014-12-13: qty 1

## 2014-12-13 MED ORDER — PROPOFOL 10 MG/ML IV BOLUS
INTRAVENOUS | Status: DC | PRN
  Administered 2014-12-13: 200 mg via INTRAVENOUS

## 2014-12-13 MED ORDER — MEPERIDINE HCL 25 MG/ML IJ SOLN: 6.2500 mg | INTRAMUSCULAR | Status: DC | PRN

## 2014-12-13 MED ORDER — METHYLPREDNISOLONE ACETATE 40 MG/ML IJ SUSP
INTRAMUSCULAR | Status: DC | PRN
  Administered 2014-12-13: 40 mg

## 2014-12-13 MED ORDER — FENTANYL CITRATE 0.05 MG/ML IJ SOLN: 50.0000 ug | INTRAMUSCULAR | Status: DC | PRN

## 2014-12-13 MED ORDER — MIDAZOLAM HCL 2 MG/2ML IJ SOLN
INTRAMUSCULAR | Status: AC
  Filled 2014-12-13: qty 2

## 2014-12-13 MED ORDER — SODIUM CHLORIDE 0.9 % IV SOLN: INTRAVENOUS | Status: DC

## 2014-12-13 MED ORDER — BUPIVACAINE-EPINEPHRINE (PF) 0.5% -1:200000 IJ SOLN
INTRAMUSCULAR | Status: AC
  Filled 2014-12-13: qty 30

## 2014-12-13 MED ORDER — SCOPOLAMINE 1 MG/3DAYS TD PT72
1.0000 | MEDICATED_PATCH | TRANSDERMAL | Status: DC
  Administered 2014-12-13: 1.5 mg via TRANSDERMAL

## 2014-12-13 MED ORDER — OXYCODONE HCL 5 MG/5ML PO SOLN: 5.0000 mg | Freq: Once | ORAL | Status: AC | PRN

## 2014-12-13 MED ORDER — HYDROMORPHONE HCL 1 MG/ML IJ SOLN
INTRAMUSCULAR | Status: AC
  Filled 2014-12-13: qty 1

## 2014-12-13 MED ORDER — CEFAZOLIN SODIUM-DEXTROSE 2-3 GM-% IV SOLR
2.0000 g | INTRAVENOUS | Status: AC
  Administered 2014-12-13: 2 g via INTRAVENOUS

## 2014-12-13 MED ORDER — OXYCODONE HCL 5 MG PO TABS
ORAL_TABLET | ORAL | Status: AC
  Filled 2014-12-13: qty 1

## 2014-12-13 MED ORDER — PROPOFOL 10 MG/ML IV BOLUS
INTRAVENOUS | Status: AC
  Filled 2014-12-13: qty 20

## 2014-12-13 MED ORDER — BUPIVACAINE-EPINEPHRINE 0.5% -1:200000 IJ SOLN
INTRAMUSCULAR | Status: DC | PRN
  Administered 2014-12-13: 20 mL

## 2014-12-13 MED ORDER — OXYCODONE-ACETAMINOPHEN 5-325 MG PO TABS: 1.0000 | ORAL_TABLET | ORAL | Status: DC | PRN

## 2014-12-13 MED ORDER — CHLORHEXIDINE GLUCONATE 4 % EX LIQD: 60.0000 mL | Freq: Once | CUTANEOUS | Status: DC

## 2014-12-13 MED ORDER — MIDAZOLAM HCL 2 MG/2ML IJ SOLN
1.0000 mg | INTRAMUSCULAR | Status: DC | PRN
Start: 2014-12-13 — End: 2014-12-13

## 2014-12-13 MED ORDER — METHYLPREDNISOLONE ACETATE 40 MG/ML IJ SUSP
INTRAMUSCULAR | Status: AC
  Filled 2014-12-13: qty 1

## 2014-12-13 MED ORDER — FENTANYL CITRATE 0.05 MG/ML IJ SOLN
INTRAMUSCULAR | Status: DC | PRN
  Administered 2014-12-13 (×2): 50 ug via INTRAVENOUS
  Administered 2014-12-13: 100 ug via INTRAVENOUS

## 2014-12-13 MED ORDER — MIDAZOLAM HCL 5 MG/5ML IJ SOLN
INTRAMUSCULAR | Status: DC | PRN
  Administered 2014-12-13: 2 mg via INTRAVENOUS

## 2014-12-13 MED ORDER — SODIUM CHLORIDE 0.9 % IR SOLN
Status: DC | PRN
  Administered 2014-12-13: 3000 mL

## 2014-12-13 MED ORDER — LIDOCAINE HCL (CARDIAC) 20 MG/ML IV SOLN
INTRAVENOUS | Status: DC | PRN
  Administered 2014-12-13: 80 mg via INTRAVENOUS

## 2014-12-13 SURGICAL SUPPLY — 47 items
BANDAGE ELASTIC 6 VELCRO ST LF (GAUZE/BANDAGES/DRESSINGS) ×1 IMPLANT
BANDAGE ESMARK 6X9 LF (GAUZE/BANDAGES/DRESSINGS) IMPLANT
BLADE 4.2CUDA (BLADE) ×1 IMPLANT
BLADE CUDA 5.5 (BLADE) IMPLANT
BLADE CUDA GRT WHITE 3.5 (BLADE) IMPLANT
BLADE CUDA SHAVER 3.5 (BLADE) IMPLANT
BLADE CUTTER MENIS 5.5 (BLADE) IMPLANT
BLADE GREAT WHITE 4.2 (BLADE) IMPLANT
BNDG CMPR 9X6 STRL LF SNTH (GAUZE/BANDAGES/DRESSINGS)
BNDG ESMARK 6X9 LF (GAUZE/BANDAGES/DRESSINGS)
BNDG GAUZE ELAST 4 BULKY (GAUZE/BANDAGES/DRESSINGS) ×3 IMPLANT
BRUSH SCRUB EZ PLAIN DRY (MISCELLANEOUS) ×2 IMPLANT
CANISTER SUCT 3000ML (MISCELLANEOUS) IMPLANT
CUTTER MENISCUS  4.2MM (BLADE)
CUTTER MENISCUS 4.2MM (BLADE) IMPLANT
DRAPE ARTHROSCOPY W/POUCH 114 (DRAPES) ×3 IMPLANT
DRSG EMULSION OIL 3X3 NADH (GAUZE/BANDAGES/DRESSINGS) ×3 IMPLANT
DURAPREP 26ML APPLICATOR (WOUND CARE) ×3 IMPLANT
GAUZE SPONGE 4X4 12PLY STRL (GAUZE/BANDAGES/DRESSINGS) ×3 IMPLANT
GLOVE BIOGEL PI IND STRL 7.0 (GLOVE) IMPLANT
GLOVE BIOGEL PI IND STRL 8 (GLOVE) ×4 IMPLANT
GLOVE BIOGEL PI INDICATOR 7.0 (GLOVE) ×1
GLOVE BIOGEL PI INDICATOR 8 (GLOVE) ×2
GLOVE ECLIPSE 6.5 STRL STRAW (GLOVE) ×1 IMPLANT
GLOVE EXAM NITRILE MD LF STRL (GLOVE) ×1 IMPLANT
GLOVE SURG ORTHO 8.0 STRL STRW (GLOVE) ×3 IMPLANT
GOWN STRL REUS W/ TWL LRG LVL3 (GOWN DISPOSABLE) ×2 IMPLANT
GOWN STRL REUS W/ TWL XL LVL3 (GOWN DISPOSABLE) ×2 IMPLANT
GOWN STRL REUS W/TWL LRG LVL3 (GOWN DISPOSABLE) ×3
GOWN STRL REUS W/TWL XL LVL3 (GOWN DISPOSABLE) ×3
HOLDER KNEE FOAM BLUE (MISCELLANEOUS) ×3 IMPLANT
IMMOBILIZER KNEE 22 UNIV (SOFTGOODS) ×1 IMPLANT
KNEE WRAP E Z 3 GEL PACK (MISCELLANEOUS) ×1 IMPLANT
MANIFOLD NEPTUNE II (INSTRUMENTS) ×1 IMPLANT
NDL SAFETY ECLIPSE 18X1.5 (NEEDLE) ×2 IMPLANT
NEEDLE HYPO 18GX1.5 SHARP (NEEDLE)
PACK ARTHROSCOPY DSU (CUSTOM PROCEDURE TRAY) ×3 IMPLANT
PACK BASIN DAY SURGERY FS (CUSTOM PROCEDURE TRAY) ×3 IMPLANT
SET ARTHROSCOPY TUBING (MISCELLANEOUS) ×3
SET ARTHROSCOPY TUBING LN (MISCELLANEOUS) ×2 IMPLANT
SUT ETHILON 4 0 PS 2 18 (SUTURE) ×3 IMPLANT
SYR 5ML LL (SYRINGE) ×2 IMPLANT
TOWEL OR 17X24 6PK STRL BLUE (TOWEL DISPOSABLE) ×3 IMPLANT
WAND 3.0 CAPSURE 30 DEG W/CORD (SURGICAL WAND) IMPLANT
WAND 30 DEG SABER W/CORD (SURGICAL WAND) ×1 IMPLANT
WAND STAR VAC 90 (SURGICAL WAND) IMPLANT
WATER STERILE IRR 1000ML POUR (IV SOLUTION) ×3 IMPLANT

## 2014-12-13 NOTE — Brief Op Note (Signed)
12/13/2014  11:50 AM  PATIENT:  Diane [name]  34 y.o. female  PRE-OPERATIVE DIAGNOSIS:  CHONDROMALACIA PATELLAE/RIGHT KNEE/PLICA SYNDROME RIGHT KNEE  POST-OPERATIVE DIAGNOSIS:  Chondromalacia patella, right knee plica syndrome   PROCEDURE:  Procedure(s): RIGHT KNEE ARTHROSCOPY CHONDROPLASTY (Right) PLICA SYNOVECTOMY (Right)  Lateral release  SURGEON:  Surgeon(s) and Role:    * W D Carloyn Manneraffrey Jr., MD - Primary  PHYSICIAN ASSISTANT:   ASSISTANTS:  ANESTHESIA:   general  EBL:  Total I/O In: 1300 [I.V.:1300] Out: -   BLOOD ADMINISTERED:none  DRAINS: none   LOCAL MEDICATIONS USED:  NONE  SPECIMEN:  No Specimen  DISPOSITION OF SPECIMEN:  N/A  COUNTS:  YES  TOURNIQUET:  * No tourniquets in log *  DICTATION: .Other Dictation: Dictation Number unknown  PLAN OF CARE: Discharge to home after PACU  PATIENT DISPOSITION:  PACU - hemodynamically stable.   Delay start of Pharmacological VTE agent (>24hrs) due to surgical blood loss or risk of bleeding: not applicable

## 2014-12-13 NOTE — Telephone Encounter (Signed)
This was refilled yesterday.

## 2014-12-13 NOTE — Transfer of Care (Signed)
Immediate Anesthesia Transfer of Care Note  Patient: Diane [name]  Procedure(s) Performed: Procedure(s): RIGHT KNEE ARTHROSCOPY CHONDROPLASTY (Right) PLICA SYNOVECTOMY (Right)  Patient Location: PACU  Anesthesia Type:General  Level of Consciousness: awake and alert   Airway & Oxygen Therapy: Patient Spontanous Breathing and Patient connected to face mask oxygen  Post-op Assessment: Report given to PACU RN and Post -op Vital signs reviewed and stable  Post vital signs: Reviewed and stable  Complications: No apparent anesthesia complications

## 2014-12-13 NOTE — Interval H&P Note (Signed)
History and Physical Interval Note:  12/13/2014 10:05 AM  Diane HeftyAshley Belizaire  has presented today for surgery, with the diagnosis of CHONDROMALACIA PATELLAE/RIGHT KNEE/PLICA SYNDROME RIGHT KNEE  The various methods of treatment have been discussed with the patient and family. After consideration of risks, benefits and other options for treatment, the patient has consented to  Procedure(s): RIGHT KNEE ARTHROSCOPY CHONDROPLASTY (Right) PLICA SYNOVECTOMY (Right) as a surgical intervention .  The patient's history has been reviewed, patient examined, no change in status, stable for surgery.  I have reviewed the patient's chart and labs.  Questions were answered to the patient's satisfaction.     Asuzena Weis JR,W D

## 2014-12-13 NOTE — Anesthesia Procedure Notes (Signed)
Procedure Name: LMA Insertion Date/Time: 12/13/2014 11:03 AM Performed by: Burna CashONRAD, Irisa Grimsley C Pre-anesthesia Checklist: Patient identified, Emergency Drugs available, Suction available and Patient being monitored Patient Re-evaluated:Patient Re-evaluated prior to inductionOxygen Delivery Method: Circle System Utilized Preoxygenation: Pre-oxygenation with 100% oxygen Intubation Type: IV induction Ventilation: Mask ventilation without difficulty LMA: LMA inserted LMA Size: 4.0 Number of attempts: 1 Airway Equipment and Method: Bite block Placement Confirmation: positive ETCO2 Tube secured with: Tape Dental Injury: Teeth and Oropharynx as per pre-operative assessment

## 2014-12-13 NOTE — H&P (View-Only) (Signed)
Diane [name] is an 33 y.o. female.   Chief Complaint: right knee pain HPI: Patient we had treated years ago for growth plate injury now with a severely painful knee. Seen at Urgent Care with no improvement on Sterapred Dosepak. Not able to take NSAID's secondary to ulcer history.Given Norco for breakthrough pain. Noticed significant pain after going on a cruise, can't really remember a specific injury. She complains of severe lateral greater than medial joint line pain, especially tender lateral and medial joint line. Previous films are unremarkable. We elected to obtain MRI which was negative for tear did show chondromalacia and plica band.    Past Medical History  Diagnosis Date  . ADD (attention deficit disorder with hyperactivity)     no current med.  . Depression   . IBS (irritable bowel syndrome)   . PONV (postoperative nausea and vomiting)   . Migraine   . Anemia   . GERD (gastroesophageal reflux disease)   . History of gastric ulcer   . Anxiety   . Family history of adverse reaction to anesthesia     pt's father has hx. of being hard to wake up post-op  . 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 History of kidney stones   . Wears partial dentures     upper    Past Surgical History  Procedure Laterality Date  . Cesarean section  12/24/2009  . Cesarean section  11/20/2011    Procedure: CESAREAN SECTION;  Surgeon: Diane J Taavon, MD;  Location: [facility];  Service: Gynecology;  Laterality: N/A;  . Laparoscopic appendectomy N/A 06/11/2014    Procedure: APPENDECTOMY LAPAROSCOPIC;  Surgeon: Diane M Gerkin, MD;  Location: [facility];  Service: General;  Laterality: N/A;  . Bladder surgery      x 2 - as a child to stretch bladder  . Tonsillectomy and adenoidectomy    . Wisdom tooth extraction    . Hysteroscopy w/ endometrial ablation  03/05/2007  . Chromopertubation  03/05/2007  . Ovarian cyst surgery Right 03/05/2007  . Cystoscopy with retrograde  pyelogram, ureteroscopy and stent placement Left 08/10/2006    Family History  Problem Relation Age of Onset  . Brain cancer Father   . Anesthesia problems Father     hard to wake up post-op   Social History:  reports that she has been smoking Cigarettes.  She has a 5 pack-year smoking history. She has never used smokeless tobacco. She reports that she drinks alcohol. She reports that she does not use illicit drugs.  Allergies:  Allergies  Allergen Reactions  . Nsaids Other (See Comments)    DUE TO HISTORY OF GASTRIC ULCER  . Metoclopramide Hcl Other (See Comments)    RESTLESSNESS/JITTERY  . Prochlorperazine Edisylate Other (See Comments)    RESTLESSNESS/JITTERY      (Not in a hospital admission)  No results found for this or any previous visit (from the past 48 hour(s)). No results found.  Review of Systems  Constitutional: Negative.   HENT: Negative for congestion, ear discharge, ear pain, hearing loss, nosebleeds, sore throat and tinnitus.   Eyes: Negative.   Respiratory: Negative.  Negative for stridor.   Cardiovascular: Negative.   Gastrointestinal: Positive for heartburn. Negative for nausea, vomiting, abdominal pain, diarrhea, constipation, blood in stool and melena.  Genitourinary: Negative.   Musculoskeletal: Positive for joint pain and falls.  Skin: Negative.   Neurological: Positive for headaches.  Endo/Heme/Allergies: Negative.   Psychiatric/Behavioral: The patient   is nervous/anxious.     Last menstrual period 12/06/2014. Physical Exam  Constitutional: She is oriented to person, place, and time. She appears well-developed and well-nourished. No distress.  HENT:  Head: Normocephalic and atraumatic.  Nose: Nose normal.  Eyes: Conjunctivae and EOM are normal. Pupils are equal, round, and reactive to light.  Neck: Normal range of motion. Neck supple.  Cardiovascular: Normal rate and intact distal pulses.   Respiratory: Effort normal. No respiratory  distress.  GI: Soft. She exhibits no distension. There is no tenderness.  Musculoskeletal:       Right knee: She exhibits decreased range of motion and swelling. She exhibits no ecchymosis, no deformity, no laceration, no erythema, no LCL laxity and no MCL laxity. Tenderness found.  Lymphadenopathy:    She has no cervical adenopathy.  Neurological: She is alert and oriented to person, place, and time. No cranial nerve deficit.  Skin: Skin is warm and dry. No rash noted. No erythema.  Psychiatric: She has a normal mood and affect. Her behavior is normal.     Assessment/Plan Right knee chondromalacia and symptomatic plica  Discussed risks and benefits of right knee arthroscopy debridement chondroplasty synovectomy and plica excision and patient wishes to proceed.  Will be performed outpatient, general anesthesia.    Diane [name] 12/12/2014, 2:46 PM    

## 2014-12-13 NOTE — Telephone Encounter (Signed)
Can we refill this and are the directions correct? 

## 2014-12-13 NOTE — Anesthesia Preprocedure Evaluation (Signed)
Anesthesia Evaluation  Patient identified by MRN, date of birth, ID band Patient awake    Reviewed: Allergy & Precautions, H&P , NPO status , Patient's Chart, lab work & pertinent test results  History of Anesthesia Complications (+) PONV and history of anesthetic complications  Airway Mallampati: II  TM Distance: >3 FB Neck ROM: Full    Dental no notable dental hx. (+) Teeth Intact   Pulmonary neg pulmonary ROS, Current Smoker,  breath sounds clear to auscultation  Pulmonary exam normal       Cardiovascular negative cardio ROS  Rhythm:Regular Rate:Normal     Neuro/Psych  Headaches, PSYCHIATRIC DISORDERS Anxiety Depression    GI/Hepatic Neg liver ROS, GERD-  Medicated and Controlled,(+)     substance abuse  , Acute Appendicitis   Endo/Other  negative endocrine ROS  Renal/GU negative Renal ROS  negative genitourinary   Musculoskeletal negative musculoskeletal ROS (+)   Abdominal (+) + obese,   Peds  Hematology  (+) anemia ,   Anesthesia Other Findings   Reproductive/Obstetrics negative OB ROS                             Anesthesia Physical  Anesthesia Plan  ASA: II  Anesthesia Plan: General   Post-op Pain Management:    Induction: Intravenous  Airway Management Planned: LMA  Additional Equipment:   Intra-op Plan:   Post-operative Plan: Extubation in OR  Informed Consent: I have reviewed the patients History and Physical, chart, labs and discussed the procedure including the risks, benefits and alternatives for the proposed anesthesia with the patient or authorized representative who has indicated his/her understanding and acceptance.   Dental advisory given  Plan Discussed with: CRNA  Anesthesia Plan Comments:         Anesthesia Quick Evaluation

## 2014-12-13 NOTE — Discharge Instructions (Signed)
Diet: As you were doing prior to hospitalization   Activity: Increase activity slowly as tolerated  No lifting or driving for 2 weeks   Shower: May shower without a dressing on post op day #2, NO SOAKING in tub   Dressing: You may change your dressing on post op day #2.  Then change the dressing daily with sterile 4"x4"s gauze dressing or band aids   Weight Bearing: weight bearing as tolerated in knee immobilizer only.  Brace:  To wear knee immobilizer while up walking, may come out of the brace 3-4 times a day to work on some gentle range of motion/bending of the knee.  Medications:  Recommend contacting prescriber of Dexilant, Effexor, and Topamax as warnings appear regarding taking over recommended daily dose.  To prevent constipation: you may use a stool softener such as -  Colace ( over the counter) 100 mg by mouth twice a day  Drink plenty of fluids ( prune juice may be helpful) and high fiber foods  Miralax ( over the counter) for constipation as needed.   Precautions: If you experience chest pain or shortness of breath - call 911 immediately For transfer to the hospital emergency department!!  If you develop a fever greater that 101 F, purulent drainage from wound, increased redness or drainage from wound, or calf pain -- Call the office   Follow- Up Appointment: Please call for an appointment to be seen in 1 [place] - [phone]   Post Anesthesia Home Care Instructions  Activity: Get plenty of rest for the remainder of the day. A responsible adult should stay with you for 24 hours following the procedure.  For the next 24 hours, DO NOT: -Drive a car -Advertising copywriterperate machinery -Drink alcoholic beverages -Take any medication unless instructed by your physician -Make any legal decisions or sign important papers.  Meals: Start with liquid foods such as gelatin or soup. Progress to regular foods as tolerated. Avoid greasy, spicy, heavy foods. If nausea and/or vomiting  occur, drink only clear liquids until the nausea and/or vomiting subsides. Call your physician if vomiting continues.  Special Instructions/Symptoms: Your throat may feel dry or sore from the anesthesia or the breathing tube placed in your throat during surgery. If this causes discomfort, gargle with warm salt water. The discomfort should disappear within 24 hours.

## 2014-12-13 NOTE — Anesthesia Postprocedure Evaluation (Signed)
Anesthesia Post Note  Patient: Diane Heftyshley [name]  Procedure(s) Performed: Procedure(s) (LRB): PLICA SYNOVECTOMY (Right) KNEE ARTHROSCOPY WITH LATERAL RELEASE (Right)  Anesthesia type: General  Patient location: PACU  Post pain: Pain level controlled  Post assessment: Post-op Vital signs reviewed  Last Vitals: BP 98/63 mmHg  Pulse 84  Temp(Src) 36.6 C (Oral)  Resp 16  Ht 5\' 7"  (1.702 m)  Wt 176 lb (79.833 kg)  BMI 27.56 kg/m2  SpO2 98%  LMP 12/09/2014  Post vital signs: Reviewed  Level of consciousness: sedated  Complications: No apparent anesthesia complications

## 2014-12-14 NOTE — Op Note (Signed)
NAMMarsa Aris:  Mackins, Chinyere               ACCOUNT NO.:  [id number]  MEDICAL RECORD NO.:  [id number]  LOCATION:                                FACILITY:  [facility]  PHYSICIAN:  Dyke BrackettW. D. Resean Brander, M.D.    DATE OF BIRTH:  05/07/1981  DATE OF PROCEDURE: DATE OF DISCHARGE:  12/13/2014                              OPERATIVE REPORT   INDICATIONS:  The patient is a 34 year old, followed in the office for intractable knee pain with injection.  MRI showing a lateral tilt of the patella, not responding to conservative treatment including PT and including an injection in the office, some improvement with injection. Based on the fact that she was still having complaints of pain, lateral tilt with chondral irregularity noted on MRI, described as central, thought arthroscopy was indicated.  PREOPERATIVE DIAGNOSES: 1. Chondromalacia, patella with lateral tilt. 2. Pathologic __________.  POSTOPERATIVE DIAGNOSES: 1. Chondromalacia, patella with lateral tilt. 2. Pathologic __________.  OPERATIONS: 1. Excision __________. 2. Arthroscopic lateral release, right knee.  SURGEON:  Dyke BrackettW. D. Paula Busenbark, M.D.  ANESTHESIA:  General with local.  DESCRIPTION OF PROCEDURE:  Inspection of the knee through inferomedial and inferolateral portals, medial compartment, lateral compartment, articular cartilage and these areas menisci, ACL, and PCL were all normal.  A thick medial shelf was resected as originally __________ were centered over that.  More recently, she complained of lateral pain. There was obviously lateral translation of patella with tilt with a very stellate lesion of the patella, which really was not an amenable debridement, but it was abutting the lateral femoral condyle and more based on the lateral facet.  Based on the fact that the patient had lateral tilt on the MRI, intractable pain, a small lesion was noted abutting on the lateral condyle, lateral facet of the patella, we elected to perform an arthroscopic  lateral release, which was accomplished without difficulty with a __________ cautery.  Portals were closed with nylon.  The subcutaneous tissues including the release site were infiltrated with 0.5% Marcaine with additional 40 mg of Depo-Medrol placed in the knee, taken to recovery room in stable condition in knee immobilizer.     Dyke BrackettW. D. Latessa Tillis, M.D.     WDC/MEDQ  D:  12/13/2014  T:  12/14/2014  Job:  [id number]

## 2014-12-15 ENCOUNTER — Encounter (HOSPITAL_BASED_OUTPATIENT_CLINIC_OR_DEPARTMENT_OTHER): Payer: Self-pay | Admitting: Orthopedic Surgery

## 2014-12-15 NOTE — Addendum Note (Signed)
Addendum  created 12/15/14 0747 by Lance CoonWesley Sayid Moll, CRNA   Modules edited: Charges VN

## 2015-01-29 ENCOUNTER — Telehealth: Payer: Self-pay | Admitting: Pulmonary Disease

## 2015-01-29 NOTE — Telephone Encounter (Signed)
Spoke with Diane [name] at [facility]. Advised her that pt needs ROV. She will let pt know verbally.

## 2015-02-08 ENCOUNTER — Other Ambulatory Visit: Payer: Self-pay | Admitting: *Deleted

## 2015-02-08 MED ORDER — FESOTERODINE FUMARATE ER 4 MG PO TB24: 4.0000 mg | ORAL_TABLET | Freq: Every day | ORAL | Status: DC

## 2015-02-12 ENCOUNTER — Telehealth: Payer: Self-pay | Admitting: Family Medicine

## 2015-02-12 NOTE — Telephone Encounter (Signed)
Refill request for Phenergan 25 mg take 1 po q 6 hrs prn and send to [facility].

## 2015-02-13 NOTE — Telephone Encounter (Signed)
Ok to refill 24 # further refills from Dr Clent RidgesFry

## 2015-02-13 NOTE — Telephone Encounter (Signed)
Pt suffers from migraines and this is why she needs the script, also was last seen on 10/24/14.

## 2015-02-14 MED ORDER — PROMETHAZINE HCL 25 MG PO TABS: 25.0000 mg | ORAL_TABLET | Freq: Four times a day (QID) | ORAL | Status: DC | PRN

## 2015-02-14 NOTE — Telephone Encounter (Signed)
I sent script e-scribe. 

## 2015-03-16 ENCOUNTER — Ambulatory Visit (INDEPENDENT_AMBULATORY_CARE_PROVIDER_SITE_OTHER): Payer: 59 | Admitting: Family Medicine

## 2015-03-16 ENCOUNTER — Encounter: Payer: Self-pay | Admitting: Family Medicine

## 2015-03-16 VITALS — BP 108/69 | HR 94 | Temp 99.0°F | Ht 67.0 in | Wt 182.0 lb

## 2015-03-16 DIAGNOSIS — R197 Diarrhea, unspecified: Secondary | ICD-10-CM

## 2015-03-16 DIAGNOSIS — K259 Gastric ulcer, unspecified as acute or chronic, without hemorrhage or perforation: Secondary | ICD-10-CM | POA: Diagnosis not present

## 2015-03-16 HISTORY — DX: Gastric ulcer, unspecified as acute or chronic, without hemorrhage or perforation: K25.9

## 2015-03-16 MED ORDER — DEXLANSOPRAZOLE 60 MG PO CPDR: 60.0000 mg | DELAYED_RELEASE_CAPSULE | Freq: Two times a day (BID) | ORAL | Status: DC

## 2015-03-16 MED ORDER — PROMETHAZINE HCL 25 MG PO TABS: 25.0000 mg | ORAL_TABLET | Freq: Four times a day (QID) | ORAL | Status: DC | PRN

## 2015-03-16 MED ORDER — DIPHENOXYLATE-ATROPINE 2.5-0.025 MG PO TABS: 1.0000 | ORAL_TABLET | Freq: Four times a day (QID) | ORAL | Status: DC | PRN

## 2015-03-16 NOTE — Progress Notes (Signed)
Pre visit review using our clinic review tool, if applicable. No additional management support is needed unless otherwise documented below in the visit note. 

## 2015-03-16 NOTE — Progress Notes (Signed)
   Subjective:    Patient ID: Diane [name], female    DOB: 10/16/1981, 34 y.o.   MRN: [id number]  HPI Here for 2 weeks of upper abdominal cramping and diarrhea. She thinks her gastric ulcer has flared up again because this is how she felt when it acted up a few years ago. She normally avoids taking any kind of NSAID due to the hx of ulcers, but she has been struggling with pain in the right knee. She is seeing Dr. Kristeen Missan Caffrey for this, and about 4 weeks ago he prescribed Celebrex for her. She took this for 2 weeks and then stopped when the diarrhea began. She has been nauseated but has not vomited. No fevers. She is taking Prevacid OTC for a total of 30 mg bid.    Review of Systems  Constitutional: Negative.   Respiratory: Negative.   Cardiovascular: Negative.   Gastrointestinal: Positive for nausea, abdominal pain and diarrhea. Negative for vomiting, constipation, blood in stool, abdominal distention, anal bleeding and rectal pain.       Objective:   Physical Exam  Constitutional: She appears well-developed and well-nourished.  Cardiovascular: Normal rate, regular rhythm, normal heart sounds and intact distal pulses.   Pulmonary/Chest: Effort normal and breath sounds normal.  Abdominal: Soft. Bowel sounds are normal. She exhibits no distension and no mass. There is no rebound and no guarding.  Mildly tender diffusely          Assessment & Plan:  I agree that the diarrhea is probably from a flare up of her ulcers, and that this resulted from taking the Celebrex. She will avoid taking any type of NSAID. Try Dexilant 60 mg bid. Use Phenergan for nausea and use Lomotil for diarrhea.

## 2015-04-11 ENCOUNTER — Telehealth: Payer: Self-pay | Admitting: Family Medicine

## 2015-04-11 DIAGNOSIS — G8929 Other chronic pain: Secondary | ICD-10-CM

## 2015-04-11 DIAGNOSIS — M25561 Pain in right knee: Principal | ICD-10-CM

## 2015-04-11 NOTE — Telephone Encounter (Signed)
Patient states Dr. Madelon Lipsaffrey, (p) [phone]  Faxed office notes recommending Cymbalta as medication for patient to start.  Patient is calling to check the status of the medication request.  Patient is currently taking gabapentin (NEURONTIN) 300 MG capsule, 900 mg per day.  She is feeling extra sluggish and tired.  She would like a callback to advise what to do.

## 2015-04-12 DIAGNOSIS — G8929 Other chronic pain: Secondary | ICD-10-CM | POA: Insufficient documentation

## 2015-04-12 DIAGNOSIS — M25561 Pain in right knee: Principal | ICD-10-CM

## 2015-04-12 NOTE — Telephone Encounter (Signed)
Referral was done  

## 2015-04-12 NOTE — Telephone Encounter (Signed)
Stay on Gabapentin for now. Stop the Effexor and switch to Cymbalta 60 mg to take bid. Call in #60 with 2 rf

## 2015-04-12 NOTE — Telephone Encounter (Signed)
I spoke with pt and she does not want to make the change right now to new medication. She had reduced the Gabapentin 600 mg twice a day. She would really like a referral [facility], she does not want to continue care with current doctor. Per Dr. Clent RidgesFry he will put in referral and I did speak with pt and gave this information.

## 2015-05-09 ENCOUNTER — Other Ambulatory Visit: Payer: Self-pay | Admitting: Family Medicine

## 2015-05-09 NOTE — Telephone Encounter (Signed)
Pt request refill of the following: diphenoxylate-atropine (LOMOTIL) 2.5-0.025 MG per tablet   Phamacy: [facility]

## 2015-05-10 NOTE — Telephone Encounter (Signed)
Call in #60 with 2 rf 

## 2015-05-11 MED ORDER — DIPHENOXYLATE-ATROPINE 2.5-0.025 MG PO TABS: 1.0000 | ORAL_TABLET | Freq: Four times a day (QID) | ORAL | Status: DC | PRN

## 2015-05-11 NOTE — Telephone Encounter (Signed)
Rx done. 

## 2015-05-11 NOTE — Addendum Note (Signed)
Addended by: Johnella Moloney on: 05/11/2015 09:38 AM   Modules accepted: Orders

## 2015-08-03 ENCOUNTER — Telehealth: Payer: Self-pay | Admitting: Family Medicine

## 2015-08-03 MED ORDER — DEXLANSOPRAZOLE 60 MG PO CPDR: 60.0000 mg | DELAYED_RELEASE_CAPSULE | Freq: Two times a day (BID) | ORAL | Status: DC

## 2015-08-03 NOTE — Telephone Encounter (Signed)
[facility] - [place] - [phone]  Requesting refill of dexlansoprazole (DEXILANT) 60 MG capsule #60, last filled 05/15/15

## 2015-08-03 NOTE — Telephone Encounter (Signed)
I sent script e-scribe. 

## 2015-08-10 ENCOUNTER — Other Ambulatory Visit: Payer: Self-pay | Admitting: Family Medicine

## 2015-08-14 NOTE — Telephone Encounter (Signed)
Call in #60 with 5 rf 

## 2015-09-04 ENCOUNTER — Ambulatory Visit (INDEPENDENT_AMBULATORY_CARE_PROVIDER_SITE_OTHER): Payer: 59 | Admitting: Family Medicine

## 2015-09-04 ENCOUNTER — Encounter: Payer: Self-pay | Admitting: Family Medicine

## 2015-09-04 VITALS — BP 113/76 | HR 91 | Temp 99.2°F | Ht 67.0 in | Wt 196.0 lb

## 2015-09-04 DIAGNOSIS — G43911 Migraine, unspecified, intractable, with status migrainosus: Secondary | ICD-10-CM

## 2015-09-04 DIAGNOSIS — K921 Melena: Secondary | ICD-10-CM

## 2015-09-04 MED ORDER — SUCRALFATE 1 GM/10ML PO SUSP: 1.0000 g | Freq: Four times a day (QID) | ORAL | Status: DC

## 2015-09-04 MED ORDER — TRAMADOL HCL 50 MG PO TABS: 100.0000 mg | ORAL_TABLET | Freq: Four times a day (QID) | ORAL | Status: DC | PRN

## 2015-09-04 MED ORDER — KETOROLAC TROMETHAMINE 60 MG/2ML IM SOLN
60.0000 mg | Freq: Once | INTRAMUSCULAR | Status: AC
  Administered 2015-09-04: 60 mg via INTRAMUSCULAR

## 2015-09-04 NOTE — Progress Notes (Signed)
   Subjective:    Patient ID: Diane [name], female    DOB: 11/21/1980, 34 y.o.   MRN: [id number]  HPI Here for chronic daily headaches, chronic generalized abdominal pains, and now with bloody stools. For the past 6 months she has had daily migraines despite the treatments provided by Darrow Bussinghristine Hagan at the headache clinic. These include Botox injections and DHE nasal sprays. The only thing she has ever found that gives her relief from the headaches is BC powders. She takes BC powders about 4 times a day every day. She takes Dexilant 60 mg bid and Zantac 300 mg bid but she still has daily abdominal pains. Then 2 days ago she developed some bright red blood per rectum with BMs. She had upper and lower endoscopies many years ago.    Review of Systems  Constitutional: Negative.   Eyes: Negative.   Respiratory: Negative.   Cardiovascular: Negative.   Gastrointestinal: Positive for abdominal pain, abdominal distention and anal bleeding. Negative for nausea, vomiting, diarrhea, constipation and rectal pain.  Neurological: Positive for headaches. Negative for dizziness, tremors, seizures, syncope, facial asymmetry, speech difficulty, weakness, light-headedness and numbness.       Objective:   Physical Exam  Constitutional: She is oriented to person, place, and time. She appears well-developed and well-nourished.  HENT:  Head: Normocephalic and atraumatic.  Eyes: Conjunctivae and EOM are normal. Pupils are equal, round, and reactive to light.  Neck: No thyromegaly present.  Cardiovascular: Normal rate, regular rhythm, normal heart sounds and intact distal pulses.   Pulmonary/Chest: Effort normal and breath sounds normal.  Abdominal: Soft. Bowel sounds are normal. She exhibits no distension and no mass. There is no rebound and no guarding.  Moderate generalized tenderness  Lymphadenopathy:    She has no cervical adenopathy.  Neurological: She is alert and oriented to person, place, and time.           Assessment & Plan:  Her migraines are poorly controlled and I urged her to see Ms. Hagan again ASAP. She needs to avoid all NSAID use and to especially avoid using BC powders. Given a Toradol shot and some Tramadol for pain. Start on Carafate suspension for the abdominal pain. She could have duodenal ulcers or a form of colitis. Refer to GI ASAP

## 2015-09-04 NOTE — Progress Notes (Signed)
Pre visit review using our clinic review tool, if applicable. No additional management support is needed unless otherwise documented below in the visit note. 

## 2015-09-04 NOTE — Addendum Note (Signed)
Addended by: Aniceto BossNIMMONS, Akua Blethen A on: 09/04/2015 12:10 PM   Modules accepted: Orders

## 2015-09-05 ENCOUNTER — Other Ambulatory Visit: Payer: Self-pay

## 2015-09-10 ENCOUNTER — Encounter: Payer: Self-pay | Admitting: Gastroenterology

## 2015-09-10 ENCOUNTER — Ambulatory Visit (INDEPENDENT_AMBULATORY_CARE_PROVIDER_SITE_OTHER): Payer: 59 | Admitting: Gastroenterology

## 2015-09-10 ENCOUNTER — Other Ambulatory Visit (INDEPENDENT_AMBULATORY_CARE_PROVIDER_SITE_OTHER): Payer: 59

## 2015-09-10 ENCOUNTER — Other Ambulatory Visit: Payer: Self-pay | Admitting: *Deleted

## 2015-09-10 VITALS — BP 84/50 | HR 60 | Ht 67.0 in | Wt 203.0 lb

## 2015-09-10 DIAGNOSIS — K589 Irritable bowel syndrome without diarrhea: Secondary | ICD-10-CM | POA: Diagnosis not present

## 2015-09-10 DIAGNOSIS — K921 Melena: Secondary | ICD-10-CM

## 2015-09-10 DIAGNOSIS — R1084 Generalized abdominal pain: Secondary | ICD-10-CM

## 2015-09-10 LAB — BASIC METABOLIC PANEL
BUN: 17 mg/dL (ref 6–23)
CALCIUM: 9.4 mg/dL (ref 8.4–10.5)
CHLORIDE: 109 meq/L (ref 96–112)
CO2: 22 meq/L (ref 19–32)
CREATININE: 0.86 mg/dL (ref 0.40–1.20)
GFR: 80.75 mL/min (ref >60.00)
Glucose, Bld: 102 mg/dL — ABNORMAL HIGH (ref 70–99)
Potassium: 3.6 mEq/L (ref 3.5–5.1)
Sodium: 139 mEq/L (ref 135–145)

## 2015-09-10 LAB — CBC WITH DIFFERENTIAL/PLATELET
BASOS PCT: 0.4 % (ref 0.0–3.0)
Basophils Absolute: 0 10*3/uL (ref 0.0–0.1)
EOS ABS: 0.1 10*3/uL (ref 0.0–0.7)
EOS PCT: 1.4 % (ref 0.0–5.0)
HCT: 35 % — ABNORMAL LOW (ref 36.0–46.0)
Hemoglobin: 11.7 g/dL — ABNORMAL LOW (ref 12.0–15.0)
LYMPHS ABS: 1.8 10*3/uL (ref 0.7–4.0)
Lymphocytes Relative: 17.6 % (ref 12.0–46.0)
MCHC: 33.5 g/dL (ref 30.0–36.0)
MCV: 90 fl (ref 78.0–100.0)
MONO ABS: 0.5 10*3/uL (ref 0.1–1.0)
Monocytes Relative: 4.5 % (ref 3.0–12.0)
NEUTROS ABS: 7.6 10*3/uL (ref 1.4–7.7)
NEUTROS PCT: 76.1 % (ref 43.0–77.0)
PLATELETS: 268 10*3/uL (ref 150.0–400.0)
RBC: 3.88 Mil/uL (ref 3.87–5.11)
RDW: 13.1 % (ref 11.5–15.5)
WBC: 10 10*3/uL (ref 4.0–10.5)

## 2015-09-10 LAB — IGA: IGA: 200 mg/dL (ref 68–378)

## 2015-09-10 LAB — HEPATIC FUNCTION PANEL
ALT: 17 U/L (ref 0–35)
AST: 16 U/L (ref 0–37)
Albumin: 4.3 g/dL (ref 3.5–5.2)
Alkaline Phosphatase: 79 U/L (ref 39–117)
BILIRUBIN TOTAL: 0.2 mg/dL (ref 0.2–1.2)
Bilirubin, Direct: 0 mg/dL (ref 0.0–0.3)
Total Protein: 7.5 g/dL (ref 6.0–8.3)

## 2015-09-10 LAB — TSH: TSH: 1.35 u[IU]/mL (ref 0.35–4.50)

## 2015-09-10 LAB — LIPASE: LIPASE: 45 U/L (ref 11.0–59.0)

## 2015-09-10 MED ORDER — GLYCOPYRROLATE 2 MG PO TABS: 2.0000 mg | ORAL_TABLET | Freq: Two times a day (BID) | ORAL | Status: DC

## 2015-09-10 MED ORDER — FESOTERODINE FUMARATE ER 4 MG PO TB24: 4.0000 mg | ORAL_TABLET | Freq: Every day | ORAL | Status: DC

## 2015-09-10 NOTE — Progress Notes (Addendum)
History of Present Illness: This is a 34 year old female referred by Nelwyn SalisburyFry, Stephen A, MD for the evaluation of generalized abdominal pain and diarrhea. She reports 1 episode of small amounts of BRBPR a few weeks ago. She takes Miralax daily for constipation which she says its worse than having diarrhea. She has a history of gastrointestinal problems for several years and has been followed by Dr. Ritta SlotJeff Medoff. She has undergone EGD and colonoscopy in the past, around 2010, by Dr. Kinnie ScalesMedoff. We requested a records prior to her visit however do not have old records available today. She reports a history of ulcers and irritable bowel syndrome. She takes [facility] powders on a regular basis for treatment of migraine headaches. She underwent appendectomy in July 2015 for acute appendicitis. Abd/pelvic CT performed before surgery in 05/2014 was unremarkable except for appendicitis. Denies weight loss, change in stool caliber, melena, hematochezia, nausea, vomiting, dysphagia, reflux symptoms, chest pain.  Review of Systems: Pertinent positive and negative review of systems were noted in the above HPI section. All other review of systems were otherwise negative.  Current Medications, Allergies, Past Medical History, Past Surgical History, Family History and Social History were reviewed in [facility] electronic medical record.  Physical Exam: General: Well developed, well nourished, obese, no acute distress Head: Normocephalic and atraumatic Eyes:  sclerae anicteric, EOMI Ears: Normal auditory acuity Mouth: No deformity or lesions Neck: Supple, no masses or thyromegaly Lungs: Clear throughout to auscultation Heart: Regular rate and rhythm; no murmurs, rubs or bruits Abdomen: Soft, mild generalized tenderness to palpation and non distended. No masses, hepatosplenomegaly or hernias noted. Normal Bowel sounds Rectal: No lesions, no tenderness Hemoccult-negative stool in the vault Musculoskeletal: Symmetrical with  no gross deformities  Skin: No lesions on visible extremities Pulses:  Normal pulses noted Extremities: No clubbing, cyanosis, edema or deformities noted Neurological: Alert oriented x 4, grossly nonfocal Cervical Nodes:  No significant cervical adenopathy Inguinal Nodes: No significant inguinal adenopathy Psychological:  Alert and cooperative. Normal mood and affect  Assessment and Recommendations:  1. Chronic generalized abdominal pain and diarrhea. One episode of small-volume hematochezia with Hemoccult negative stool today. Suspected IBS and a benign anorectal source of bleeding. Rule out recurrent ulcer, gastritis. CMP, CBC, lipase, TSH, tTG, IgA. Decrease Miralax dose and titrate dose for 1-2 BM per day. Start Robinul 2 mg po bid. Avoid foods that trigger GI symptoms. Avoid, or at least minimize, aspirin and NSAID usage. Follow up with your headache specialist. Continue dexlansoprazole twice daily and ranitidine 300 mg twice daily for now although it is not clear that she needs this high dosing. I would like to review Dr. Jennye BoroughsMedoff's prior records before consideration of further evaluation and before adjusting her acid suppressant medications. REV in 1 month.   Addendum on 09/19/2015.  Records from Dr. Kinnie ScalesMedoff received and reviewed. Colonoscopy performed in November 2001 was normal.  EGD performed in March 2003 showed 2 gastric ulcers, H. pylori negative. EGD performed May 2003 showed healing of gastric ulcers. EGD performed October 2005 showed antral ulcers. EGD performed December 2005 showed healing of gastric ulcers and gastric food retention EGD performed February 2009 erosive gastritis EGD performed October 2014 reactive gastropathy on biopsy, H. pylori negative.  She has a long history of abdominal pain been treated for NSAID induced ulcers and gastritis as well as irritable bowel syndrome. Consider repeat colonoscopy. Might need repeat EGD. Will discuss at REV.   cc: Nelwyn SalisburyStephen A Fry,  MD [address] [place],   [address]

## 2015-09-10 NOTE — Patient Instructions (Addendum)
Your physician has requested that you go to the basement for the following lab work before leaving today:  We have sent the following medications to your pharmacy for you to pick up at your convenience:glyccopyrolate.   You can decrease your Miralax to 1/2 capful or 1/3 capful daily. Titrate depending on your bowel movements.   Your follow up visit with Dr. Russella DarStark is scheduled for 10/11/15 at 2:45pm.   Thank you for choosing me and [name].  Diane LickMalcolm T. Pleas [name], Jr., MD., Clementeen GrahamFACG

## 2015-09-11 LAB — TISSUE TRANSGLUTAMINASE, IGA: Tissue Transglutaminase Ab, IgA: 1 U/mL (ref <4)

## 2015-10-03 ENCOUNTER — Encounter: Payer: Self-pay | Admitting: Family Medicine

## 2015-10-03 ENCOUNTER — Ambulatory Visit (INDEPENDENT_AMBULATORY_CARE_PROVIDER_SITE_OTHER): Payer: 59 | Admitting: Family Medicine

## 2015-10-03 VITALS — BP 103/71 | HR 91 | Temp 99.1°F | Ht 67.0 in | Wt 200.0 lb

## 2015-10-03 DIAGNOSIS — J019 Acute sinusitis, unspecified: Secondary | ICD-10-CM | POA: Diagnosis not present

## 2015-10-03 DIAGNOSIS — H8110 Benign paroxysmal vertigo, unspecified ear: Secondary | ICD-10-CM

## 2015-10-03 MED ORDER — HYDROCODONE-HOMATROPINE 5-1.5 MG/5ML PO SYRP: 5.0000 mL | ORAL_SOLUTION | ORAL | Status: DC | PRN

## 2015-10-03 MED ORDER — MECLIZINE HCL 25 MG PO TABS: 25.0000 mg | ORAL_TABLET | ORAL | Status: DC | PRN

## 2015-10-03 MED ORDER — AZITHROMYCIN 250 MG PO TABS: ORAL_TABLET | ORAL | Status: DC

## 2015-10-03 MED ORDER — METHYLPREDNISOLONE ACETATE 80 MG/ML IJ SUSP
120.0000 mg | Freq: Once | INTRAMUSCULAR | Status: AC
  Administered 2015-10-03: 120 mg via INTRAMUSCULAR

## 2015-10-03 NOTE — Progress Notes (Signed)
   Subjective:    Patient ID: Diane [name], female    DOB: 05/02/1981, 34 y.o.   MRN: [id number]  HPI Here for 4 days of sinus pressure, HA, PND, dizziness which makes the room seem to be spinning, and a dry cough.    Review of Systems  Constitutional: Negative.   HENT: Positive for congestion, postnasal drip and sinus pressure. Negative for ear pain and sore throat.   Eyes: Negative.   Respiratory: Positive for cough.   Neurological: Positive for dizziness.       Objective:   Physical Exam  Constitutional: She is oriented to person, place, and time. She appears well-developed and well-nourished.  Appears ill   HENT:  Right Ear: External ear normal.  Left Ear: External ear normal.  Nose: Nose normal.  Mouth/Throat: Oropharynx is clear and moist.  Eyes: Conjunctivae are normal.  Neck: No thyromegaly present.  Cardiovascular: Normal rate, regular rhythm, normal heart sounds and intact distal pulses.   Pulmonary/Chest: Effort normal and breath sounds normal.  Lymphadenopathy:    She has no cervical adenopathy.  Neurological: She is alert and oriented to person, place, and time. No cranial nerve deficit. Coordination normal.          Assessment & Plan:  She has a sinusitis which is causing vertigo. Treat with a steroid shot, a Zpack, Mucinex D, and Meclizine.

## 2015-10-03 NOTE — Progress Notes (Signed)
Pre visit review using our clinic review tool, if applicable. No additional management support is needed unless otherwise documented below in the visit note. 

## 2015-10-03 NOTE — Addendum Note (Signed)
Addended by: Aniceto BossNIMMONS, Vincient Vanaman A on: 10/03/2015 10:31 AM   Modules accepted: Orders

## 2015-10-05 ENCOUNTER — Telehealth: Payer: Self-pay | Admitting: Family Medicine

## 2015-10-05 MED ORDER — LEVOFLOXACIN 500 MG PO TABS: 500.0000 mg | ORAL_TABLET | Freq: Every day | ORAL | Status: DC

## 2015-10-05 NOTE — Telephone Encounter (Signed)
Patient states that she is on her 3 day of a Z pack and is not getting any better. Patient states that she is dizzy and having left ear pain and still in her.  Patient states that if another [facility] is [facility] [place]

## 2015-10-05 NOTE — Telephone Encounter (Signed)
Call in Levaquin 500 mg a day for 10 days  

## 2015-10-05 NOTE — Telephone Encounter (Signed)
I sent script e-scribe and spoke with pt. 

## 2015-10-15 ENCOUNTER — Telehealth: Payer: Self-pay | Admitting: Family Medicine

## 2015-10-15 NOTE — Telephone Encounter (Signed)
Patient would like a refill on her Hydromet cough syrup please. Please call when ready for pick up.

## 2015-10-16 ENCOUNTER — Ambulatory Visit: Payer: 59 | Admitting: Gastroenterology

## 2015-10-16 ENCOUNTER — Telehealth: Payer: Self-pay

## 2015-10-16 MED ORDER — HYDROCODONE-HOMATROPINE 5-1.5 MG/5ML PO SYRP: 5.0000 mL | ORAL_SOLUTION | ORAL | Status: DC | PRN

## 2015-10-16 NOTE — Telephone Encounter (Signed)
Please charge Lady GaryYesi

## 2015-10-16 NOTE — Telephone Encounter (Signed)
done

## 2015-10-16 NOTE — Telephone Encounter (Signed)
Yes charge 

## 2015-10-16 NOTE — Telephone Encounter (Signed)
Allen Derryhristy D Harris  Reyna Lorenzi L Tondra Reierson, CMA           Pt resch'd bc she is sick    Do you want to charge?

## 2015-10-16 NOTE — Telephone Encounter (Signed)
Script is ready for pick up and I spoke with pt.  

## 2015-11-06 ENCOUNTER — Other Ambulatory Visit: Payer: Self-pay | Admitting: Family Medicine

## 2015-11-06 MED ORDER — DEXLANSOPRAZOLE 60 MG PO CPDR: 60.0000 mg | DELAYED_RELEASE_CAPSULE | Freq: Two times a day (BID) | ORAL | Status: DC

## 2015-11-08 ENCOUNTER — Encounter: Payer: Self-pay | Admitting: Family Medicine

## 2015-11-08 ENCOUNTER — Telehealth: Payer: Self-pay | Admitting: Family Medicine

## 2015-11-08 ENCOUNTER — Ambulatory Visit (INDEPENDENT_AMBULATORY_CARE_PROVIDER_SITE_OTHER): Payer: 59 | Admitting: Family Medicine

## 2015-11-08 VITALS — BP 98/61 | HR 87 | Temp 99.3°F | Ht 67.0 in | Wt 196.0 lb

## 2015-11-08 DIAGNOSIS — J019 Acute sinusitis, unspecified: Secondary | ICD-10-CM

## 2015-11-08 MED ORDER — VALACYCLOVIR HCL 500 MG PO TABS: 500.0000 mg | ORAL_TABLET | Freq: Two times a day (BID) | ORAL | Status: DC

## 2015-11-08 MED ORDER — FERREX 150 FORTE PLUS 50-100 MG PO CAPS: 150.0000 mg | ORAL_CAPSULE | Freq: Two times a day (BID) | ORAL | Status: DC

## 2015-11-08 MED ORDER — LEVOFLOXACIN 500 MG PO TABS: 500.0000 mg | ORAL_TABLET | Freq: Every day | ORAL | Status: DC

## 2015-11-08 MED ORDER — HYDROCOD POLST-CPM POLST ER 10-8 MG/5ML PO SUER: 5.0000 mL | Freq: Two times a day (BID) | ORAL | Status: DC | PRN

## 2015-11-08 MED ORDER — FLUCONAZOLE 150 MG PO TABS: 150.0000 mg | ORAL_TABLET | Freq: Every day | ORAL | Status: DC

## 2015-11-08 MED ORDER — METHYLPREDNISOLONE ACETATE 80 MG/ML IJ SUSP
120.0000 mg | Freq: Once | INTRAMUSCULAR | Status: AC
  Administered 2015-11-08: 120 mg via INTRAMUSCULAR

## 2015-11-08 NOTE — Addendum Note (Signed)
Addended by: Gershon CraneFRY, Milley Vining A on: 11/08/2015 10:20 AM   Modules accepted: Orders

## 2015-11-08 NOTE — Addendum Note (Signed)
Addended by: Aniceto BossNIMMONS, SYLVIA A on: 11/08/2015 10:46 AM   Modules accepted: Orders

## 2015-11-08 NOTE — Progress Notes (Signed)
   Subjective:    Patient ID: Danna Heftyshley Briseno, female    DOB: 11/01/1981, 34 y.o.   MRN: [id number]  HPI Here for 4 days of fever, sinus pressure, PND, ST, and a dry cough. Of note she was treated for similar sx on 10-03-15 with a Zpack. Thi sdid not help and 5 days later we gave er a course of Levaquin. This worked quite well for her. She as a tendency to get sinus infections every winter.    Review of Systems  Constitutional: Positive for fever.  HENT: Positive for congestion, postnasal drip, sinus pressure and sore throat. Negative for ear pain.   Eyes: Negative.   Respiratory: Positive for cough.        Objective:   Physical Exam  Constitutional: She appears well-developed and well-nourished.  HENT:  Right Ear: External ear normal.  Left Ear: External ear normal.  Nose: Nose normal.  Mouth/Throat: Oropharynx is clear and moist. No oropharyngeal exudate.  Eyes: Conjunctivae are normal.  Neck: Neck supple. No thyromegaly present.  Pulmonary/Chest: Effort normal and breath sounds normal.  Lymphadenopathy:    She has no cervical adenopathy.          Assessment & Plan:  Sinusitis, treat with Levaquin. Given a steroid shot. Add Mucinex prn

## 2015-11-08 NOTE — Telephone Encounter (Signed)
Mom said daughter is a addict and does not need hydrocodone. Mom said she is concern and it is very serious about her taking this medicine.    [phone]

## 2015-11-08 NOTE — Progress Notes (Signed)
Pre visit review using our clinic review tool, if applicable. No additional management support is needed unless otherwise documented below in the visit note. 

## 2015-11-09 NOTE — Telephone Encounter (Signed)
Thanks for the input!

## 2015-11-16 ENCOUNTER — Telehealth: Payer: Self-pay | Admitting: Family Medicine

## 2015-11-16 NOTE — Telephone Encounter (Signed)
Yes it is okay to fill the other half

## 2015-11-16 NOTE — Telephone Encounter (Signed)
Ms. Odis Lusteraster called saying she needs a Rx of Tussinex sent to her pharmacy. [facility]. If you have questions, please call the pt.  Pt's ph# [phone] Thank you.

## 2015-11-16 NOTE — Telephone Encounter (Signed)
Ave Filterhandler from [facility] called saying the [employer]pt's insurance only paid for half of the medication. Ave FilterChandler wants to know if she can have permission to fill up half of the prescrition (120mL).   Chandler's ph# [phone] Thank you.

## 2015-11-16 NOTE — Telephone Encounter (Signed)
Chandler notified.

## 2015-11-20 ENCOUNTER — Telehealth: Payer: Self-pay | Admitting: Family Medicine

## 2015-11-20 ENCOUNTER — Other Ambulatory Visit: Payer: Self-pay

## 2015-11-20 ENCOUNTER — Telehealth: Payer: Self-pay

## 2015-11-20 MED ORDER — HYDROCOD POLST-CPM POLST ER 10-8 MG/5ML PO SUER: 5.0000 mL | Freq: Two times a day (BID) | ORAL | Status: DC | PRN

## 2015-11-20 NOTE — Telephone Encounter (Signed)
Pharmacy says that patient needs to rx to get the other half of her Tussionex refilled. They are not able to accept a verbal over the phone. Pt's husband will be here this morning for labs if we can give him a hard copy or the pharmacy says they will accept a fax as well.

## 2015-11-20 NOTE — Telephone Encounter (Signed)
We took care of the Tussionex syrup refill, but I will NOT refill any pain medications for her

## 2015-11-20 NOTE — Telephone Encounter (Signed)
Pt requesting HYDROCODONE Pt last visit 11/08/15 I dont see that medication in her list    Please advise

## 2015-11-20 NOTE — Telephone Encounter (Signed)
rx is ready to be picked up or faxed to the pharmacy

## 2015-11-23 ENCOUNTER — Ambulatory Visit (INDEPENDENT_AMBULATORY_CARE_PROVIDER_SITE_OTHER): Payer: Self-pay | Admitting: Gastroenterology

## 2015-11-23 ENCOUNTER — Encounter: Payer: Self-pay | Admitting: Gastroenterology

## 2015-11-23 VITALS — BP 90/64 | HR 88 | Ht 65.5 in | Wt 195.4 lb

## 2015-11-23 DIAGNOSIS — K219 Gastro-esophageal reflux disease without esophagitis: Secondary | ICD-10-CM

## 2015-11-23 DIAGNOSIS — K589 Irritable bowel syndrome without diarrhea: Secondary | ICD-10-CM

## 2015-11-23 DIAGNOSIS — K921 Melena: Secondary | ICD-10-CM

## 2015-11-23 DIAGNOSIS — R197 Diarrhea, unspecified: Secondary | ICD-10-CM

## 2015-11-23 MED ORDER — NA SULFATE-K SULFATE-MG SULF 17.5-3.13-1.6 GM/177ML PO SOLN: 1.0000 | Freq: Once | ORAL | Status: DC

## 2015-11-23 MED ORDER — DICYCLOMINE HCL 10 MG PO CAPS: 10.0000 mg | ORAL_CAPSULE | Freq: Three times a day (TID) | ORAL | Status: DC

## 2015-11-23 NOTE — Patient Instructions (Signed)
You have been scheduled for an endoscopy and colonoscopy. Please follow the written instructions given to you at your visit today. Please pick up your prep supplies at the pharmacy within the next 1-3 days. If you use inhalers (even only as needed), please bring them with you on the day of your procedure. Your physician has requested that you go to www.startemmi.com and enter the access code given to you at your visit today. This web site gives a general overview about your procedure. However, you should still follow specific instructions given to you by our office regarding your preparation for the procedure.  Discontinue Robinul Start Bentyl three times a day before meals

## 2015-11-23 NOTE — Progress Notes (Signed)
    History of Present Illness: This is a 35 year old female returning for follow-up of diarrhea, abdominal pain and GERD. Her abdominal pain is clearly exacerbated by meals. Robinul has been very helpful but not completely effective. She still relates 5-6 loose stools per day with significant urgency and occasional small amounts of rectal bleeding. Her reflux symptoms are under good control. Bloodwork from her last visit was unremarkable except for mild anemia. She did not return for Fe, TIBC, B12 and folate.  Current Medications, Allergies, Past Medical History, Past Surgical History, Family History and Social History were reviewed in [facility] electronic medical record.  Physical Exam: General: Well developed, well nourished, no acute distress Head: Normocephalic and atraumatic Eyes:  sclerae anicteric, EOMI Ears: Normal auditory acuity Mouth: No deformity or lesions Lungs: Clear throughout to auscultation Heart: Regular rate and rhythm; no murmurs, rubs or bruits Abdomen: Soft and non distended.  Mild generalized tenderness without rebound or guarding. No masses, hepatosplenomegaly or hernias noted. Normal Bowel sounds Rectal: deferred to colonoscopy Musculoskeletal: Symmetrical with no gross deformities  Pulses:  Normal pulses noted Extremities: No clubbing, cyanosis, edema or deformities noted Neurological: Alert oriented x 4, grossly nonfocal Psychological:  Alert and cooperative. Normal mood and affect  Assessment and Recommendations:  1. 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  Rule out IBD and other disorders. Trial of a low FODMAP diet for 10-14 days. Change to dicyclomine 20 mg 3 times a day before meals. Schedule colonoscopy. The risks (including bleeding, perforation, infection, missed lesions, medication reactions and possible hospitalization or surgery if complications occur), benefits, and alternatives to colonoscopy with possible biopsy and possible polypectomy were discussed with the patient  and they consent to proceed.   2. GERD.  History of ulcers. She relates that she is no longer taking any aspirin products she does take Advil infrequently. Decrease Dexilant to once daily. Schedule EGD. The risks (including bleeding, perforation, infection, missed lesions, medication reactions and possible hospitalization or surgery if complications occur), benefits, and alternatives to endoscopy with possible biopsy and possible dilation were discussed with the patient and they consent to proceed.   3.  Mild anemia. Fe, TIBC, B12, folate today.

## 2015-11-29 ENCOUNTER — Telehealth: Payer: Self-pay | Admitting: Family Medicine

## 2015-11-29 NOTE — Telephone Encounter (Signed)
Call in #[phone] with no rf 

## 2015-11-29 NOTE — Telephone Encounter (Signed)
Pt request refill of the following: traMADol (ULTRAM) 50 MG tablet   Phamacy: [facility]

## 2015-11-30 ENCOUNTER — Telehealth: Payer: Self-pay | Admitting: Gastroenterology

## 2015-11-30 MED ORDER — TRAMADOL HCL 50 MG PO TABS: 100.0000 mg | ORAL_TABLET | Freq: Four times a day (QID) | ORAL | Status: DC | PRN

## 2015-11-30 NOTE — Telephone Encounter (Signed)
Informed patient that we have a sample of Suprep she can pick up at our front desk. Pt states she will come by on Monday to pick up prep.

## 2015-11-30 NOTE — Telephone Encounter (Signed)
I called in script 

## 2015-12-20 ENCOUNTER — Telehealth: Payer: Self-pay | Admitting: Family Medicine

## 2015-12-20 NOTE — Telephone Encounter (Signed)
[employer] denied prior authorization for fesoterodine (TOVIAZ) 4 MG TB24 tablet stating:  Restricted access medication may be covered when two alternative medication on the member's formulary have been tired and did not work.  In this case, none of the alternative medications have been tried.  Alternative medications include:  Darifenacin ER, Oxybutynin, Oxybutynin ER, Tolterodine, Tolterodine ER, Trospium, Trospium ER.

## 2015-12-21 MED ORDER — OXYBUTYNIN CHLORIDE ER 10 MG PO TB24: 10.0000 mg | ORAL_TABLET | Freq: Every day | ORAL | Status: DC

## 2015-12-21 NOTE — Telephone Encounter (Signed)
Cancel the Toviaz and instead call in Oxybutynin ER 10 mg daily. One year supply

## 2015-12-21 NOTE — Telephone Encounter (Signed)
I left a voice message with below information. We need to know if pt want's to make the change and if so then what pharmacy, also either a 30 or 90 day supply?

## 2015-12-21 NOTE — Telephone Encounter (Signed)
I spoke with pt and sent script e-scribe and updated medication list.

## 2015-12-21 NOTE — Telephone Encounter (Signed)
Can you check on a prior authorization for Dexilant? Pt has been waiting on this medication.

## 2015-12-24 NOTE — Telephone Encounter (Signed)
PA submitted for Dexilant - will await determination.

## 2015-12-25 ENCOUNTER — Other Ambulatory Visit: Payer: Self-pay | Admitting: Family Medicine

## 2015-12-25 NOTE — Telephone Encounter (Signed)
Pt calling to request refill of traMADol (ULTRAM) 50 MG tablet.  States she needs this today as she is leaving to go out of town early tomorrow morning.  Pharmacy:  [facility].

## 2015-12-26 ENCOUNTER — Other Ambulatory Visit: Payer: Self-pay | Admitting: *Deleted

## 2015-12-26 MED ORDER — TRAMADOL HCL 50 MG PO TABS: 100.0000 mg | ORAL_TABLET | Freq: Four times a day (QID) | ORAL | Status: DC | PRN

## 2015-12-26 NOTE — Telephone Encounter (Signed)
Open in error

## 2015-12-26 NOTE — Telephone Encounter (Signed)
done

## 2015-12-26 NOTE — Telephone Encounter (Signed)
Rx called in to pharmacy.  Called to make pt aware- no answer.

## 2015-12-28 NOTE — Telephone Encounter (Signed)
Please ask Dr. Russella Dar (her GI doctor) to address this issue

## 2015-12-28 NOTE — Telephone Encounter (Signed)
PA for dexilant is denied per [facility] stating:  Restricted access medications may be covered when two alternative medications on the member's formulary have been tried and did not work.  In this case, only one of the alternative medications has been tried, lansoprazole.    Alternative medications include: esomeprazole, pantoprazole, etc.

## 2015-12-30 NOTE — Telephone Encounter (Signed)
Please review notes and help her fill a PPI

## 2015-12-31 MED ORDER — PANTOPRAZOLE SODIUM 40 MG PO TBEC: 40.0000 mg | DELAYED_RELEASE_TABLET | Freq: Every day | ORAL | Status: DC

## 2015-12-31 NOTE — Telephone Encounter (Signed)
Insurance company wants patient to try alternative PPI. Will send in another PPI on patient's formulary.

## 2016-01-04 ENCOUNTER — Encounter (HOSPITAL_COMMUNITY): Payer: Self-pay | Admitting: *Deleted

## 2016-01-04 ENCOUNTER — Emergency Department (HOSPITAL_COMMUNITY): Payer: BLUE CROSS/BLUE SHIELD

## 2016-01-04 ENCOUNTER — Emergency Department (HOSPITAL_COMMUNITY)
Admission: EM | Admit: 2016-01-04 | Discharge: 2016-01-04 | Disposition: A | Discharge status: Home / Self Care | Payer: BLUE CROSS/BLUE SHIELD | Attending: Emergency Medicine | Admitting: Emergency Medicine

## 2016-01-04 ENCOUNTER — Ambulatory Visit (AMBULATORY_SURGERY_CENTER): Payer: BLUE CROSS/BLUE SHIELD | Admitting: Gastroenterology

## 2016-01-04 ENCOUNTER — Encounter: Payer: Self-pay | Admitting: Gastroenterology

## 2016-01-04 VITALS — BP 96/71 | HR 70 | Temp 98.7°F | Resp 35 | Ht 65.5 in | Wt 195.0 lb

## 2016-01-04 DIAGNOSIS — F329 Major depressive disorder, single episode, unspecified: Secondary | ICD-10-CM | POA: Insufficient documentation

## 2016-01-04 DIAGNOSIS — K921 Melena: Secondary | ICD-10-CM | POA: Diagnosis not present

## 2016-01-04 DIAGNOSIS — K219 Gastro-esophageal reflux disease without esophagitis: Secondary | ICD-10-CM

## 2016-01-04 DIAGNOSIS — Y92009 Unspecified place in unspecified non-institutional (private) residence as the place of occurrence of the external cause: Secondary | ICD-10-CM | POA: Insufficient documentation

## 2016-01-04 DIAGNOSIS — R197 Diarrhea, unspecified: Secondary | ICD-10-CM

## 2016-01-04 DIAGNOSIS — F17210 Nicotine dependence, cigarettes, uncomplicated: Secondary | ICD-10-CM | POA: Insufficient documentation

## 2016-01-04 DIAGNOSIS — D649 Anemia, unspecified: Secondary | ICD-10-CM | POA: Diagnosis not present

## 2016-01-04 DIAGNOSIS — Z98811 Dental restoration status: Secondary | ICD-10-CM | POA: Diagnosis not present

## 2016-01-04 DIAGNOSIS — G43909 Migraine, unspecified, not intractable, without status migrainosus: Secondary | ICD-10-CM | POA: Insufficient documentation

## 2016-01-04 DIAGNOSIS — Y998 Other external cause status: Secondary | ICD-10-CM | POA: Diagnosis not present

## 2016-01-04 DIAGNOSIS — Z79899 Other long term (current) drug therapy: Secondary | ICD-10-CM | POA: Diagnosis not present

## 2016-01-04 DIAGNOSIS — R1084 Generalized abdominal pain: Secondary | ICD-10-CM | POA: Diagnosis not present

## 2016-01-04 DIAGNOSIS — K259 Gastric ulcer, unspecified as acute or chronic, without hemorrhage or perforation: Secondary | ICD-10-CM

## 2016-01-04 DIAGNOSIS — Z87442 Personal history of urinary calculi: Secondary | ICD-10-CM | POA: Insufficient documentation

## 2016-01-04 DIAGNOSIS — K2970 Gastritis, unspecified, without bleeding: Secondary | ICD-10-CM | POA: Diagnosis not present

## 2016-01-04 DIAGNOSIS — K2990 Gastroduodenitis, unspecified, without bleeding: Secondary | ICD-10-CM

## 2016-01-04 DIAGNOSIS — F419 Anxiety disorder, unspecified: Secondary | ICD-10-CM | POA: Insufficient documentation

## 2016-01-04 DIAGNOSIS — S99911A Unspecified injury of right ankle, initial encounter: Secondary | ICD-10-CM | POA: Diagnosis present

## 2016-01-04 DIAGNOSIS — Y9389 Activity, other specified: Secondary | ICD-10-CM | POA: Diagnosis not present

## 2016-01-04 DIAGNOSIS — W1839XA Other fall on same level, initial encounter: Secondary | ICD-10-CM | POA: Diagnosis not present

## 2016-01-04 DIAGNOSIS — S82851A Displaced trimalleolar fracture of right lower leg, initial encounter for closed fracture: Secondary | ICD-10-CM | POA: Diagnosis not present

## 2016-01-04 DIAGNOSIS — N189 Chronic kidney disease, unspecified: Secondary | ICD-10-CM | POA: Diagnosis not present

## 2016-01-04 DIAGNOSIS — Z8739 Personal history of other diseases of the musculoskeletal system and connective tissue: Secondary | ICD-10-CM | POA: Diagnosis not present

## 2016-01-04 DIAGNOSIS — K3189 Other diseases of stomach and duodenum: Secondary | ICD-10-CM | POA: Diagnosis not present

## 2016-01-04 DIAGNOSIS — K589 Irritable bowel syndrome without diarrhea: Secondary | ICD-10-CM | POA: Diagnosis not present

## 2016-01-04 MED ORDER — OXYCODONE-ACETAMINOPHEN 5-325 MG PO TABS: 1.0000 | ORAL_TABLET | Freq: Four times a day (QID) | ORAL | Status: DC | PRN

## 2016-01-04 MED ORDER — HYDROMORPHONE HCL 1 MG/ML IJ SOLN
1.0000 mg | Freq: Once | INTRAMUSCULAR | Status: AC
  Administered 2016-01-04: 1 mg via INTRAVENOUS
  Filled 2016-01-04: qty 1

## 2016-01-04 MED ORDER — OXYCODONE-ACETAMINOPHEN 5-325 MG PO TABS
1.0000 | ORAL_TABLET | Freq: Once | ORAL | Status: AC
  Administered 2016-01-04: 1 via ORAL
  Filled 2016-01-04: qty 1

## 2016-01-04 MED ORDER — FENTANYL CITRATE (PF) 100 MCG/2ML IJ SOLN
50.0000 ug | Freq: Once | INTRAMUSCULAR | Status: AC
  Administered 2016-01-04: 50 ug via NASAL
  Filled 2016-01-04: qty 2

## 2016-01-04 MED ORDER — HYDROMORPHONE HCL 1 MG/ML IJ SOLN
INTRAMUSCULAR | Status: AC
  Administered 2016-01-04: 1 mg
  Filled 2016-01-04: qty 1

## 2016-01-04 MED ORDER — HYDROMORPHONE HCL 1 MG/ML IJ SOLN: 1.0000 mg | Freq: Once | INTRAMUSCULAR | Status: DC

## 2016-01-04 NOTE — Progress Notes (Signed)
Pt had called to let [facility] know she was going to be late because she fell and felt her right ankle was broken.  Per Dr. Russella Dar, pt to be evaluated when she arrives to [facility].  When she gets here, taken to bay and Dr. Russella Dar at bedside to assess right ankle.  Right ankle swollen and edematous.  She rates ankle pain as a "7".  She is very willing and insistent to have procedure done since she prepped.  Made aware that she may be repositioned and ankle may be moved but she still wants to have procedures done.  Ok to proceed per Dr. Russella Dar.  Right ankle elevated and ice applied.  Ice removed almost immediately per pt request

## 2016-01-04 NOTE — Op Note (Signed)
[facility] [address] [place], [address]   ENDOSCOPY PROCEDURE REPORT  PATIENT: Diane [name], Diane [name]  MR#: [id number] BIRTHDATE: [birth year] , 34  yrs. old GENDER: female ENDOSCOPIST: Meryl Dare, MD, [facility] REFERRED BY:  [name] PROCEDURE DATE:  01/04/2016 PROCEDURE:  EGD w/ biopsy ASA CLASS:     Class II INDICATIONS:  abdominal pain and history of esophageal reflux. MEDICATIONS: Monitored anesthesia care, Residual sedation present, and Propofol 200 mg IV TOPICAL ANESTHETIC: none DESCRIPTION OF PROCEDURE: After the risks benefits and alternatives of the procedure were thoroughly explained, informed consent was obtained.  The [id number] endoscope was introduced through the mouth and advanced to the second portion of the duodenum , Without limitations.  The instrument was slowly withdrawn as the mucosa was fully examined. See colonoscopy report regarding ankle injury that occured at her home earlier today.   STOMACH: A single non-bleeding, shallow and linear ulcer was found in the prepyloric region of the stomach.  Biopsies were taken at edge of the ulcer.   Moderate erosive gastritis (inflammation) was found in the gastric fundus and gastric body.  Multiple biopsies were performed.   The stomach otherwise appeared normal. ESOPHAGUS: The mucosa of the esophagus appeared normal. DUODENUM: The duodenal mucosa showed no abnormalities in the bulb and 2nd part of the duodenum.  Retroflexed views revealed no abnormalities.   The scope was then withdrawn from the patient and the procedure completed.  COMPLICATIONS: There were no immediate complications.  ENDOSCOPIC IMPRESSION: 1.   Single ulcer in the prepyloric region of the stomach; biopsies were taken 2.   Erosive gastritis in the gastric fundus and gastric body; multiple biopsies performed 3.   The EGD otherwise appeared normal  RECOMMENDATIONS: 1.  Anti-reflux regimen long term 2.   Await pathology results 3.  Continue PPI 4.  Completely avoid all ASA/NSAID products  eSigned:  Meryl Dare, MD, [facility] 01/04/2016 2:53 PM

## 2016-01-04 NOTE — Discharge Instructions (Signed)
Please call Dr. August Saucer from orthopedic surgery to confirm follow-up appointment on Monday. Please keep foot elevated at all times while at rest, and ice. Return without fail for worsening symptoms including numbness, skin discoloration, worsening pain, or any other symptoms concerning to you.   Ankle Fracture A fracture is a break in a bone. A cast or splint may be used to protect the ankle and heal the break. Sometimes, surgery is needed. HOME CARE  Use crutches as told by your doctor. It is very important that you use your crutches correctly.  Do not put weight or pressure on the injured ankle until told by your doctor.  Keep your ankle raised (elevated) when sitting or lying down.  Apply ice to the ankle:  Put ice in a plastic bag.  Place a towel between your cast and the bag.  Leave the ice on for 20 minutes, 2-3 times a day.  If you have a plaster or fiberglass cast:  Do not try to scratch under the cast with any objects.  Check the skin around the cast every day. You may put lotion on red or sore areas.  Keep your cast dry and clean.  If you have a plaster splint:  Wear the splint as told by your doctor.  You can loosen the elastic around the splint if your toes get numb, tingle, or turn cold or blue.  Do not put pressure on any part of your cast or splint. It may break. Rest your plaster splint or cast only on a pillow the first 24 hours until it is fully hardened.  Cover your cast or splint with a plastic bag during showers.  Do not lower your cast or splint into water.  Take medicine as told by your doctor.  Do not drive until your doctor says it is safe.  Follow-up with your doctor as told. It is very important that you go to your follow-up visits. GET HELP IF: The swelling and discomfort gets worse.  GET HELP RIGHT AWAY IF:   Your splint or cast breaks.  You continue to have very bad pain.  You have new pain or swelling after your splint or cast was put  on.  Your skin or toes below the injured ankle:  Turn blue or gray.  Feel cold, numb, or you cannot feel them.  There is a bad smell or yellowish white fluid (pus) coming from under the splint or cast. MAKE SURE YOU:   Understand these instructions.  Will watch your condition.  Will get help right away if you are not doing well or get worse.   This information is not intended to replace advice given to you by your health care provider. Make sure you discuss any questions you have with your health care provider.   Document Released: 08/31/2009 Document Revised: 08/24/2013 Document Reviewed: 06/02/2013 Elsevier Interactive Patient Education 2016 [employer] Inc[employer]  Cast or Splint Care Casts and splints support injured limbs and keep bones from moving while they heal. It is important to care for your cast or splint at home.  HOME CARE INSTRUCTIONS  Keep the cast or splint uncovered during the drying period. It can take 24 to 48 hours to dry if it is made of plaster. A fiberglass cast will dry in less than 1 hour.  Do not rest the cast on anything harder than a pillow for the first 24 hours.  Do not put weight on your injured limb or apply pressure to the cast  until your health care provider gives you permission.  Keep the cast or splint dry. Wet casts or splints can lose their shape and may not support the limb as well. A wet cast that has lost its shape can also create harmful pressure on your skin when it dries. Also, wet skin can become infected.  Cover the cast or splint with a plastic bag when bathing or when out in the rain or snow. If the cast is on the trunk of the body, take sponge baths until the cast is removed.  If your cast does become wet, dry it with a towel or a blow dryer on the cool setting only.  Keep your cast or splint clean. Soiled casts may be wiped with a moistened cloth.  Do not place any hard or soft foreign objects under your cast or splint, such as  cotton, toilet paper, lotion, or powder.  Do not try to scratch the skin under the cast with any object. The object could get stuck inside the cast. Also, scratching could lead to an infection. If itching is a problem, use a blow dryer on a cool setting to relieve discomfort.  Do not trim or cut your cast or remove padding from inside of it.  Exercise all joints next to the injury that are not immobilized by the cast or splint. For example, if you have a long leg cast, exercise the hip joint and toes. If you have an arm cast or splint, exercise the shoulder, elbow, thumb, and fingers.  Elevate your injured arm or leg on 1 or 2 pillows for the first 1 to 3 days to decrease swelling and pain.It is best if you can comfortably elevate your cast so it is higher than your heart. SEEK MEDICAL CARE IF:   Your cast or splint cracks.  Your cast or splint is too tight or too loose.  You have unbearable itching inside the cast.  Your cast becomes wet or develops a soft spot or area.  You have a bad smell coming from inside your cast.  You get an object stuck under your cast.  Your skin around the cast becomes red or raw.  You have new pain or worsening pain after the cast has been applied. SEEK IMMEDIATE MEDICAL CARE IF:   You have fluid leaking through the cast.  You are unable to move your fingers or toes.  You have discolored (blue or white), cool, painful, or very swollen fingers or toes beyond the cast.  You have tingling or numbness around the injured area.  You have severe pain or pressure under the cast.  You have any difficulty with your breathing or have shortness of breath.  You have chest pain.   This information is not intended to replace advice given to you by your health care provider. Make sure you discuss any questions you have with your health care provider.   Document Released: 10/31/2000 Document Revised: 08/24/2013 Document Reviewed: 05/12/2013 Elsevier  Interactive Patient Education [employer]

## 2016-01-04 NOTE — Op Note (Signed)
[facility] [address] [place], [address]   COLONOSCOPY PROCEDURE REPORT  PATIENT: Diane, [name]  MR#: [id number] BIRTHDATE: 1980-12-28 , 34  yrs. old GENDER: female ENDOSCOPIST: Meryl Dare, MD, [facility] REFERRED BY:  [name] PROCEDURE DATE:  01/04/2016 PROCEDURE:   Colonoscopy, diagnostic and Colonoscopy with biopsy First Screening Colonoscopy - Avg.  risk and is 50 yrs.  old or older - No.  Prior Negative Screening - Now for repeat screening. N/A  History of Adenoma - Now for follow-up colonoscopy & has been > or = to 3 yrs.  N/A  Polyps removed today? No Recommend repeat exam, <10 yrs? No ASA CLASS:   Class II INDICATIONS:Evaluation of unexplained GI bleeding and Clinically significant diarrhea of unexplained origin.  Pt injured her right ankle at home today with swelling. Advised to go to urgent care or ED however pt wanted to complete her GI procedues prior to seeking care for her ankle. Neurovascular exam of right ankle and right foot was normal. Limited mobility and lateral edema noted. We took great care to keep her ankle in a stable position during her entire time in the [facility]. MEDICATIONS: Monitored anesthesia care and Propofol 300 mg IV DESCRIPTION OF PROCEDURE:   After the risks benefits and alternatives of the procedure were thoroughly explained, informed consent was obtained.  The digital rectal exam revealed no abnormalities of the rectum.   The LB PFC-H190 [id number]  endoscope was introduced through the anus and advanced to the terminal ileum which was intubated for a short distance. No adverse events experienced with a tortuous colon.   The quality of the prep was adequate after extensive rinsing and suctioning (Suprep was used). The instrument was then slowly withdrawn as the colon was fully examined. Estimated blood loss is zero unless otherwise noted in this procedure report.   COLON FINDINGS: A normal appearing cecum,  ileocecal valve, and appendiceal orifice were identified.  The ascending, transverse, descending, sigmoid colon, and rectum appeared unremarkable. Multiple random biopsies were performed.   The examined terminal ileum appeared to be normal.  Retroflexed views revealed no abnormalities. The time to cecum = 6.8 Withdrawal time = 11.9   The scope was withdrawn and the procedure completed. COMPLICATIONS: There were no immediate complications.  ENDOSCOPIC IMPRESSION: 1.   Normal colonoscopy; multiple random biopsies performed 2.   The examined terminal ileum appeared to be normal  RECOMMENDATIONS: 1.  Await pathology results 2.  Continue treatment for IBS  eSigned:  Meryl Dare, MD, [facility] 01/04/2016 2:47 PM

## 2016-01-04 NOTE — Progress Notes (Signed)
Called to room to assist during endoscopic procedure.  Patient ID and intended procedure confirmed with present staff. Received instructions for my participation in the procedure from the performing physician.  

## 2016-01-04 NOTE — Progress Notes (Signed)
Report to PACU, RN, vss, BBS= Clear.  

## 2016-01-04 NOTE — ED Notes (Addendum)
Pt states she missed a step and fell, ankle swollen and painful. Declined ice pack

## 2016-01-04 NOTE — ED Provider Notes (Signed)
CSN: [id number]     Arrival date & time 01/04/16  1539 History   First MD Initiated Contact with Patient 01/04/16 1647     Chief Complaint  Patient presents with  . Ankle Pain     (Consider location/radiation/quality/duration/timing/severity/associated sxs/prior Treatment) HPI 35 year old female who presents with right ankle injury. History of migraine headaches, IBS, depression, and anxiety. Does not take any blood thinners. States that she missed a step going down her home today, and twisted her right ankle falling onto her right side. Did not have hit her head or have loss of consciousness. Had swelling and inability to bear weight on right ankle immediately after injuries. Denies numbness, weakness, or any other injuries.  Past Medical History  Diagnosis Date  . ADD (attention deficit disorder with hyperactivity)     no current med.  . Depression   . IBS (irritable bowel syndrome)   . PONV (postoperative nausea and vomiting)   . Migraine     sees Darrow Bussing NP   . Anemia   . GERD (gastroesophageal reflux disease)   . History of gastric ulcer   . Anxiety   . Family history of adverse reaction to anesthesia     pt's father has hx. of being hard to wake up post-op  . 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 History of kidney stones   . Wears partial dentures     upper  . Chronic kidney disease     kidney stone   Past Surgical History  Procedure Laterality Date  . Cesarean section  12/24/2009  . Cesarean section  11/20/2011    Procedure: CESAREAN SECTION;  Surgeon: Lenoard Aden, MD;  Location: [facility];  Service: Gynecology;  Laterality: N/A;  . Laparoscopic appendectomy N/A 06/11/2014    Procedure: APPENDECTOMY LAPAROSCOPIC;  Surgeon: Velora Heckler, MD;  Location: [facility];  Service: General;  Laterality: N/A;  . Bladder surgery      x 2 - as a child to stretch bladder  . Tonsillectomy and adenoidectomy    . Wisdom tooth  extraction    . Hysteroscopy w/ endometrial ablation  03/05/2007  . Chromopertubation  03/05/2007  . Ovarian cyst surgery Right 03/05/2007  . Cystoscopy with retrograde pyelogram, ureteroscopy and stent placement Left 08/10/2006  . Synovectomy Right 12/13/2014    Procedure: PLICA SYNOVECTOMY;  Surgeon: Thera Flake., MD;  Location: [facility];  Service: Orthopedics;  Laterality: Right;  . Knee arthroscopy with lateral release Right 12/13/2014    Procedure: KNEE ARTHROSCOPY WITH LATERAL RELEASE;  Surgeon: Thera Flake., MD;  Location:  SURGERY CENTER;  Service: Orthopedics;  Laterality: Right;  . Appendectomy     Family History  Problem Relation Age of Onset  . Brain cancer Father   . Anesthesia problems Father     hard to wake up post-op  . Lung cancer Father   . Breast cancer Maternal Grandmother   . Ovarian cancer Maternal Grandmother   . Diabetes Maternal Grandmother   . Heart disease Maternal Grandmother   . Irritable bowel syndrome Mother   . Prostate cancer Maternal Grandfather   . Melanoma Maternal Grandfather   . Colon cancer Neg Hx   . Esophageal cancer Neg Hx   . Stomach cancer Neg Hx   . Rectal cancer Neg Hx    Social History  Substance Use Topics  . Smoking status: Current Every Day Smoker -- 0.50  packs/day for 10 years    Types: Cigarettes  . Smokeless tobacco: Never Used     Comment: or less  . Alcohol Use: 0.0 oz/week    0 Standard drinks or equivalent per week     Comment: rare   OB History    Gravida Para Term Preterm AB TAB SAB Ectopic Multiple Living   0 0 0 0 0 2     Review of Systems 10/14 systems reviewed and are negative other than those stated in the HPI    Allergies  Nsaids; Metoclopramide hcl; and Prochlorperazine edisylate  Home Medications   Prior to Admission medications   Medication Sig Start Date End Date Taking? Authorizing Provider  dicyclomine (BENTYL) 10 MG capsule Take 1 capsule (10 mg total) by  mouth 3 (three) times daily before meals. 11/23/15  Yes Meryl Dare, MD  diphenoxylate-atropine (LOMOTIL) 2.5-0.025 MG per tablet Take 1 tablet by mouth 4 (four) times daily as needed for diarrhea or loose stools. 05/11/15  Yes Nelwyn Salisbury, MD  Fe-Succ Ac-C-Thre Ac-B12-FA (FERREX 150 FORTE PLUS) 50-100 MG CAPS Take 150 mg by mouth 2 (two) times daily. 11/08/15  Yes Nelwyn Salisbury, MD  gabapentin (NEURONTIN) 300 MG capsule Take 300 mg by mouth 2 (two) times daily.  07/29/13  Yes Nelwyn Salisbury, MD  labetalol (NORMODYNE) 200 MG tablet Take 0.5 tablets (100 mg total) by mouth 2 (two) times daily. 12/12/14  Yes Nelwyn Salisbury, MD  LORazepam (ATIVAN) 1 MG tablet TAKE 1 TABLET BY MOUTH EVERY 6 HOURS AS NEEDED FOR ANXIETY 08/14/15  Yes Nelwyn Salisbury, MD  Magnesium 400 MG TABS Take 1,200 mg by mouth daily.   Yes Historical Provider, MD  methocarbamol (ROBAXIN) 750 MG tablet Take 750 mg by mouth 2 (two) times daily as needed (migraines).  12/26/15  Yes Historical Provider, MD  oxybutynin (DITROPAN-XL) 10 MG 24 hr tablet Take 1 tablet (10 mg total) by mouth at bedtime. 12/21/15  Yes Nelwyn Salisbury, MD  promethazine (PHENERGAN) 25 MG tablet Take 1 tablet (25 mg total) by mouth every 6 (six) hours as needed for nausea or vomiting. 03/16/15  Yes Nelwyn Salisbury, MD  ranitidine (ZANTAC) 150 MG capsule Take 300 mg by mouth 2 (two) times daily.    Yes Historical Provider, MD  spironolactone (ALDACTONE) 100 MG tablet Take 100 mg by mouth daily.   Yes Historical Provider, MD  tiZANidine (ZANAFLEX) 4 MG tablet Take 4 tablets by mouth at bedtime as needed (migraines).  12/24/15  Yes Historical Provider, MD  topiramate (TOPAMAX) 200 MG tablet Take 500 mg by mouth daily.   Yes Historical Provider, MD  traMADol (ULTRAM) 50 MG tablet Take 2 tablets (100 mg total) by mouth every 6 (six) hours as needed for moderate pain. 12/26/15  Yes Nelwyn Salisbury, MD  venlafaxine XR (EFFEXOR-XR) 150 MG 24 hr capsule Take 300 mg by mouth daily.    Yes  Historical Provider, MD  oxyCODONE-acetaminophen (PERCOCET/ROXICET) 5-325 MG tablet Take 1-2 tablets by mouth every 6 (six) hours as needed for moderate pain or severe pain. 01/04/16   Lavera Guise, MD  valACYclovir (VALTREX) 500 MG tablet Take 1 tablet (500 mg total) by mouth 2 (two) times daily. Patient taking differently: Take 500 mg by mouth 2 (two) times daily as needed.  11/08/15   Nelwyn Salisbury, MD   BP 94/54 mmHg  Pulse 75  Temp(Src) 97.9 F (36.6 C) (Oral)  Resp 16  SpO2 97%  LMP 01/04/2016 Physical Exam Physical Exam  Nursing note and vitals reviewed. Constitutional: Well developed, well nourished, non-toxic, and in no acute distress Head: Normocephalic and atraumatic.  Mouth/Throat: Oropharynx is clear and moist.  Neck: Normal range of motion. Neck supple.  Cardiovascular: Normal rate and regular rhythm.  +2 DP pulses normal distal capillary refill in feet Pulmonary/Chest: Effort normal and breath sounds normal.  Abdominal: Soft. There is no tenderness. There is no rebound and no guarding.  Musculoskeletal: Significant swelling of the right ankle, with significant bruising over the medial malleolus and heel. Sensation to light touch in tact over deep peroneal, superficial and common peroneal, sural, saphenous nerves. Full strength with large toe dorsi and plantar flexion.  Neurological: Alert, no facial droop, fluent speech, moves all extremities symmetrically Skin: Skin is warm and dry.  Psychiatric: Cooperative  ED Course  Procedures (including critical care time) Labs Review Labs Reviewed - No data to display  Imaging Review Dg Tibia/fibula Right  01/04/2016  CLINICAL DATA:  Right ankle fracture. Tibia/ fibula bruising and pain. EXAM: RIGHT TIBIA AND FIBULA - 2 VIEW COMPARISON:  Right ankle radiographs from earlier today. FINDINGS: Re- demonstrated is a comminuted right lateral malleolus fracture with 4 mm posterior displacement of the dominant distal fracture fragment.  Re- demonstrated is a transverse right medial malleolus fracture with 3 mm distal displacement of the distal fracture fragment. Re- demonstrated is nondisplaced posterior malleolus fracture in the posterior right distal tibia. Diffuse right ankle soft tissue swelling. No additional fracture is seen in the right tibia or right fibula. No suspicious focal osseous lesion. No malalignment at the right knee on the provided views. No pathologic soft tissue calcifications. IMPRESSION: Re- demonstration of trimalleolar right ankle fractures. No additional abnormality in the right tibia or right fibula. Electronically Signed   By: Delbert Phenix M.D.   On: 01/04/2016 18:56   Dg Ankle Complete Right  01/04/2016  CLINICAL DATA:  Missed a step coming down stairs earlier today. Twisted right ankle. EXAM: RIGHT ANKLE - COMPLETE 3+ VIEW COMPARISON:  None. FINDINGS: There is diffuse soft tissue swelling. There is a comminuted fracture of the distal fibula. There is a fracture of the medial malleolus. Bone fragment along the medial aspect of the ankle. Intra-articular fracture along the posterior distal tibia. There is disruption of the ankle joint with widening of the anterior ankle joint on the lateral view. IMPRESSION: Comminuted trimalleolar ankle fracture with disruption of the ankle joint. Diffuse soft tissue swelling. Electronically Signed   By: Richarda Overlie M.D.   On: 01/04/2016 16:41   Dg Knee Complete 4 Views Right  01/04/2016  CLINICAL DATA:  35 year old female with right knee and ankle pain after missing a step well walking down the stairs earlier today EXAM: RIGHT KNEE - COMPLETE 4+ VIEW COMPARISON:  Concurrently obtained radiographs of the right lower leg and ankle FINDINGS: There is no evidence of fracture, dislocation, or joint effusion. There is no evidence of arthropathy or other focal bone abnormality. Soft tissues are unremarkable. IMPRESSION: Negative. Electronically Signed   By: Malachy Moan M.D.   On:  01/04/2016 18:53   I have personally reviewed and evaluated these images and lab results as part of my medical decision-making.   MDM   Final diagnoses:  Trimalleolar fracture, right, closed, initial encounter    35 year old female who presents after fall with right ankle injury. X-ray visualized and reveals trimalleolar fracture. Closed injury. Neurovascularly in  tact exam. No further injury proximally on X-ray of knee and tib/fib. Discussed with Dr. August Saucer from orthopedic surgery. Recommended posterior leg splint with stir-ups, NWB, and crutches. Will follow-up with Dr. August Saucer in 3 days with potential operative management in 4 days. Strict return and follow-up instructions reviewed. She expressed understanding of all discharge instructions and felt comfortable with the plan of care.     Lavera Guise, MD 01/04/16 2056

## 2016-01-04 NOTE — Patient Instructions (Signed)

## 2016-01-05 ENCOUNTER — Ambulatory Visit (HOSPITAL_COMMUNITY): Payer: BLUE CROSS/BLUE SHIELD | Admitting: Certified Registered Nurse Anesthetist

## 2016-01-05 ENCOUNTER — Encounter (HOSPITAL_COMMUNITY)
Admission: RE | Disposition: A | Discharge status: Home / Self Care | Payer: Self-pay | Source: Ambulatory Visit | Attending: Orthopedic Surgery

## 2016-01-05 ENCOUNTER — Ambulatory Visit (HOSPITAL_COMMUNITY): Payer: BLUE CROSS/BLUE SHIELD

## 2016-01-05 ENCOUNTER — Encounter (HOSPITAL_COMMUNITY): Payer: Self-pay | Admitting: Certified Registered Nurse Anesthetist

## 2016-01-05 ENCOUNTER — Observation Stay (HOSPITAL_COMMUNITY)
Admission: RE | Admit: 2016-01-05 | Discharge: 2016-01-07 | Disposition: A | Discharge status: Home / Self Care | Payer: BLUE CROSS/BLUE SHIELD | Source: Ambulatory Visit | Attending: Orthopedic Surgery | Admitting: Orthopedic Surgery

## 2016-01-05 ENCOUNTER — Observation Stay (HOSPITAL_COMMUNITY): Payer: BLUE CROSS/BLUE SHIELD

## 2016-01-05 DIAGNOSIS — S82851A Displaced trimalleolar fracture of right lower leg, initial encounter for closed fracture: Principal | ICD-10-CM | POA: Insufficient documentation

## 2016-01-05 DIAGNOSIS — W19XXXA Unspecified fall, initial encounter: Secondary | ICD-10-CM | POA: Insufficient documentation

## 2016-01-05 DIAGNOSIS — Z79899 Other long term (current) drug therapy: Secondary | ICD-10-CM | POA: Insufficient documentation

## 2016-01-05 DIAGNOSIS — F17210 Nicotine dependence, cigarettes, uncomplicated: Secondary | ICD-10-CM | POA: Diagnosis not present

## 2016-01-05 DIAGNOSIS — K219 Gastro-esophageal reflux disease without esophagitis: Secondary | ICD-10-CM | POA: Insufficient documentation

## 2016-01-05 DIAGNOSIS — K589 Irritable bowel syndrome without diarrhea: Secondary | ICD-10-CM | POA: Diagnosis not present

## 2016-01-05 DIAGNOSIS — Z6831 Body mass index (BMI) 31.0-31.9, adult: Secondary | ICD-10-CM | POA: Diagnosis not present

## 2016-01-05 DIAGNOSIS — F329 Major depressive disorder, single episode, unspecified: Secondary | ICD-10-CM | POA: Diagnosis not present

## 2016-01-05 DIAGNOSIS — F909 Attention-deficit hyperactivity disorder, unspecified type: Secondary | ICD-10-CM | POA: Diagnosis not present

## 2016-01-05 DIAGNOSIS — G8929 Other chronic pain: Secondary | ICD-10-CM | POA: Diagnosis present

## 2016-01-05 DIAGNOSIS — D649 Anemia, unspecified: Secondary | ICD-10-CM | POA: Diagnosis not present

## 2016-01-05 DIAGNOSIS — R52 Pain, unspecified: Secondary | ICD-10-CM

## 2016-01-05 DIAGNOSIS — Z419 Encounter for procedure for purposes other than remedying health state, unspecified: Secondary | ICD-10-CM

## 2016-01-05 DIAGNOSIS — S82899A Other fracture of unspecified lower leg, initial encounter for closed fracture: Secondary | ICD-10-CM

## 2016-01-05 HISTORY — PX: ORIF ANKLE FRACTURE: SHX5408

## 2016-01-05 SURGERY — OPEN REDUCTION INTERNAL FIXATION (ORIF) ANKLE FRACTURE
Anesthesia: General | Site: Ankle | Laterality: Right

## 2016-01-05 MED ORDER — LACTATED RINGERS IV SOLN
INTRAVENOUS | Status: DC | PRN
Start: 2016-01-05 — End: 2016-01-05
  Administered 2016-01-05 (×4): via INTRAVENOUS

## 2016-01-05 MED ORDER — METOCLOPRAMIDE HCL 5 MG/ML IJ SOLN: 5.0000 mg | Freq: Three times a day (TID) | INTRAMUSCULAR | Status: DC | PRN

## 2016-01-05 MED ORDER — OXYCODONE HCL 5 MG PO TABS: 5.0000 mg | ORAL_TABLET | Freq: Once | ORAL | Status: DC | PRN

## 2016-01-05 MED ORDER — TIZANIDINE HCL 4 MG PO TABS
16.0000 mg | ORAL_TABLET | Freq: Every evening | ORAL | Status: DC | PRN
  Administered 2016-01-06: 16 mg via ORAL
  Filled 2016-01-05: qty 4

## 2016-01-05 MED ORDER — ACETAMINOPHEN 160 MG/5ML PO SOLN
325.0000 mg | ORAL | Status: DC | PRN
Start: 2016-01-05 — End: 2016-01-05
  Filled 2016-01-05: qty 20.3

## 2016-01-05 MED ORDER — MORPHINE SULFATE (PF) 2 MG/ML IV SOLN
2.0000 mg | INTRAVENOUS | Status: DC | PRN
  Administered 2016-01-06: 2 mg via INTRAVENOUS
  Filled 2016-01-05: qty 1

## 2016-01-05 MED ORDER — BUPIVACAINE HCL (PF) 0.25 % IJ SOLN
INTRAMUSCULAR | Status: AC
  Filled 2016-01-05: qty 30

## 2016-01-05 MED ORDER — RIVAROXABAN 10 MG PO TABS: 10.0000 mg | ORAL_TABLET | Freq: Every day | ORAL | Status: DC

## 2016-01-05 MED ORDER — PROPOFOL 10 MG/ML IV BOLUS
INTRAVENOUS | Status: AC
  Filled 2016-01-05: qty 20

## 2016-01-05 MED ORDER — PHENYLEPHRINE HCL 10 MG/ML IJ SOLN
INTRAMUSCULAR | Status: DC | PRN
  Administered 2016-01-05: 40 ug via INTRAVENOUS

## 2016-01-05 MED ORDER — ACETAMINOPHEN 325 MG PO TABS
325.0000 mg | ORAL_TABLET | ORAL | Status: DC | PRN
Start: 2016-01-05 — End: 2016-01-05

## 2016-01-05 MED ORDER — GLYCOPYRROLATE 0.2 MG/ML IJ SOLN
INTRAMUSCULAR | Status: DC | PRN
  Administered 2016-01-05: 0.6 mg via INTRAVENOUS

## 2016-01-05 MED ORDER — FAMOTIDINE 20 MG PO TABS
20.0000 mg | ORAL_TABLET | Freq: Every day | ORAL | Status: DC
  Administered 2016-01-06 – 2016-01-07 (×2): 20 mg via ORAL
  Filled 2016-01-05 (×2): qty 1

## 2016-01-05 MED ORDER — BUPIVACAINE-EPINEPHRINE (PF) 0.5% -1:200000 IJ SOLN
INTRAMUSCULAR | Status: DC | PRN
  Administered 2016-01-05: 30 mL via PERINEURAL
  Administered 2016-01-05: 10 mL via PERINEURAL

## 2016-01-05 MED ORDER — VENLAFAXINE HCL ER 150 MG PO CP24
300.0000 mg | ORAL_CAPSULE | Freq: Every day | ORAL | Status: DC
  Administered 2016-01-06 – 2016-01-07 (×2): 300 mg via ORAL
  Filled 2016-01-05 (×2): qty 2

## 2016-01-05 MED ORDER — VALACYCLOVIR HCL 500 MG PO TABS: 500.0000 mg | ORAL_TABLET | Freq: Two times a day (BID) | ORAL | Status: DC

## 2016-01-05 MED ORDER — DICYCLOMINE HCL 10 MG PO CAPS
10.0000 mg | ORAL_CAPSULE | Freq: Three times a day (TID) | ORAL | Status: DC
  Administered 2016-01-06 – 2016-01-07 (×4): 10 mg via ORAL
  Filled 2016-01-05 (×5): qty 1

## 2016-01-05 MED ORDER — GLYCOPYRROLATE 0.2 MG/ML IJ SOLN
INTRAMUSCULAR | Status: AC
  Filled 2016-01-05: qty 3

## 2016-01-05 MED ORDER — OXYCODONE-ACETAMINOPHEN 10-325 MG PO TABS: 1.0000 | ORAL_TABLET | ORAL | Status: DC | PRN

## 2016-01-05 MED ORDER — METOCLOPRAMIDE HCL 5 MG PO TABS: 5.0000 mg | ORAL_TABLET | Freq: Three times a day (TID) | ORAL | Status: DC | PRN

## 2016-01-05 MED ORDER — LORAZEPAM 1 MG PO TABS
1.0000 mg | ORAL_TABLET | Freq: Four times a day (QID) | ORAL | Status: DC | PRN
  Administered 2016-01-06 – 2016-01-07 (×2): 1 mg via ORAL
  Filled 2016-01-05 (×2): qty 1

## 2016-01-05 MED ORDER — FENTANYL CITRATE (PF) 100 MCG/2ML IJ SOLN
INTRAMUSCULAR | Status: DC | PRN
  Administered 2016-01-05: 100 ug via INTRAVENOUS
  Administered 2016-01-05: 50 ug via INTRAVENOUS
  Administered 2016-01-05: 100 ug via INTRAVENOUS

## 2016-01-05 MED ORDER — NEOSTIGMINE METHYLSULFATE 10 MG/10ML IV SOLN
INTRAVENOUS | Status: AC
  Filled 2016-01-05: qty 1

## 2016-01-05 MED ORDER — LACTATED RINGERS IV SOLN: INTRAVENOUS | Status: DC

## 2016-01-05 MED ORDER — 0.9 % SODIUM CHLORIDE (POUR BTL) OPTIME
TOPICAL | Status: DC | PRN
  Administered 2016-01-05 (×5): 1000 mL

## 2016-01-05 MED ORDER — MAGNESIUM OXIDE 400 (241.3 MG) MG PO TABS
600.0000 mg | ORAL_TABLET | Freq: Two times a day (BID) | ORAL | Status: DC
  Administered 2016-01-05 – 2016-01-07 (×3): 600 mg via ORAL
  Filled 2016-01-05 (×2): qty 2
  Filled 2016-01-05: qty 1.5
  Filled 2016-01-05 (×2): qty 2
  Filled 2016-01-05: qty 3

## 2016-01-05 MED ORDER — ONDANSETRON HCL 4 MG/2ML IJ SOLN
INTRAMUSCULAR | Status: AC
  Filled 2016-01-05: qty 2

## 2016-01-05 MED ORDER — OXYCODONE HCL 5 MG PO TABS
5.0000 mg | ORAL_TABLET | ORAL | Status: DC | PRN
  Administered 2016-01-05 – 2016-01-07 (×9): 10 mg via ORAL
  Filled 2016-01-05 (×9): qty 2

## 2016-01-05 MED ORDER — GABAPENTIN 300 MG PO CAPS
300.0000 mg | ORAL_CAPSULE | Freq: Two times a day (BID) | ORAL | Status: DC
  Administered 2016-01-05 – 2016-01-07 (×4): 300 mg via ORAL
  Filled 2016-01-05 (×4): qty 1

## 2016-01-05 MED ORDER — ONDANSETRON HCL 4 MG/2ML IJ SOLN: 4.0000 mg | Freq: Four times a day (QID) | INTRAMUSCULAR | Status: DC | PRN

## 2016-01-05 MED ORDER — ACETAMINOPHEN 650 MG RE SUPP: 650.0000 mg | Freq: Four times a day (QID) | RECTAL | Status: DC | PRN

## 2016-01-05 MED ORDER — HYDROMORPHONE HCL 1 MG/ML IJ SOLN: 0.2500 mg | INTRAMUSCULAR | Status: DC | PRN

## 2016-01-05 MED ORDER — LABETALOL HCL 100 MG PO TABS
100.0000 mg | ORAL_TABLET | Freq: Two times a day (BID) | ORAL | Status: DC
  Administered 2016-01-06: 100 mg via ORAL
  Filled 2016-01-05 (×5): qty 1

## 2016-01-05 MED ORDER — OXYCODONE HCL 5 MG/5ML PO SOLN: 5.0000 mg | Freq: Once | ORAL | Status: DC | PRN

## 2016-01-05 MED ORDER — TOPIRAMATE 100 MG PO TABS
500.0000 mg | ORAL_TABLET | Freq: Every day | ORAL | Status: DC
  Administered 2016-01-05 – 2016-01-07 (×2): 500 mg via ORAL
  Filled 2016-01-05 (×3): qty 5

## 2016-01-05 MED ORDER — POTASSIUM CHLORIDE IN NACL 20-0.9 MEQ/L-% IV SOLN
INTRAVENOUS | Status: AC
  Administered 2016-01-05 – 2016-01-06 (×2): via INTRAVENOUS
  Filled 2016-01-05 (×3): qty 1000

## 2016-01-05 MED ORDER — POLYSACCHARIDE IRON COMPLEX 150 MG PO CAPS
150.0000 mg | ORAL_CAPSULE | Freq: Every day | ORAL | Status: DC
  Administered 2016-01-05 – 2016-01-06 (×2): 150 mg via ORAL
  Filled 2016-01-05 (×4): qty 1

## 2016-01-05 MED ORDER — CEFAZOLIN SODIUM-DEXTROSE 2-3 GM-% IV SOLR
2.0000 g | Freq: Four times a day (QID) | INTRAVENOUS | Status: AC
  Administered 2016-01-05 – 2016-01-06 (×2): 2 g via INTRAVENOUS
  Filled 2016-01-05 (×2): qty 50

## 2016-01-05 MED ORDER — ONDANSETRON HCL 4 MG/2ML IJ SOLN
INTRAMUSCULAR | Status: DC | PRN
  Administered 2016-01-05 (×2): 4 mg via INTRAVENOUS

## 2016-01-05 MED ORDER — SPIRONOLACTONE 100 MG PO TABS
100.0000 mg | ORAL_TABLET | Freq: Every day | ORAL | Status: DC
  Filled 2016-01-05 (×3): qty 1

## 2016-01-05 MED ORDER — NEOSTIGMINE METHYLSULFATE 10 MG/10ML IV SOLN
INTRAVENOUS | Status: DC | PRN
  Administered 2016-01-05: 4 mg via INTRAVENOUS

## 2016-01-05 MED ORDER — DIPHENOXYLATE-ATROPINE 2.5-0.025 MG PO TABS
1.0000 | ORAL_TABLET | Freq: Four times a day (QID) | ORAL | Status: DC | PRN
Start: 2016-01-05 — End: 2016-01-07

## 2016-01-05 MED ORDER — ARTIFICIAL TEARS OP OINT
TOPICAL_OINTMENT | OPHTHALMIC | Status: AC
  Filled 2016-01-05: qty 3.5

## 2016-01-05 MED ORDER — LIDOCAINE HCL (CARDIAC) 20 MG/ML IV SOLN
INTRAVENOUS | Status: DC | PRN
  Administered 2016-01-05: 70 mg via INTRAVENOUS

## 2016-01-05 MED ORDER — SUCCINYLCHOLINE CHLORIDE 20 MG/ML IJ SOLN
INTRAMUSCULAR | Status: AC
  Filled 2016-01-05: qty 1

## 2016-01-05 MED ORDER — ROCURONIUM BROMIDE 50 MG/5ML IV SOLN
INTRAVENOUS | Status: AC
  Filled 2016-01-05: qty 3

## 2016-01-05 MED ORDER — ROCURONIUM BROMIDE 100 MG/10ML IV SOLN
INTRAVENOUS | Status: DC | PRN
  Administered 2016-01-05: 50 mg via INTRAVENOUS

## 2016-01-05 MED ORDER — FENTANYL CITRATE (PF) 250 MCG/5ML IJ SOLN
INTRAMUSCULAR | Status: AC
  Filled 2016-01-05: qty 5

## 2016-01-05 MED ORDER — ACETAMINOPHEN 325 MG PO TABS
650.0000 mg | ORAL_TABLET | Freq: Four times a day (QID) | ORAL | Status: DC | PRN
  Administered 2016-01-05 – 2016-01-06 (×3): 650 mg via ORAL
  Filled 2016-01-05 (×3): qty 2

## 2016-01-05 MED ORDER — LIDOCAINE HCL (CARDIAC) 20 MG/ML IV SOLN
INTRAVENOUS | Status: AC
  Filled 2016-01-05: qty 5

## 2016-01-05 MED ORDER — CEFAZOLIN SODIUM-DEXTROSE 2-3 GM-% IV SOLR
2.0000 g | Freq: Once | INTRAVENOUS | Status: AC
  Administered 2016-01-05: 2 g via INTRAVENOUS

## 2016-01-05 MED ORDER — PROMETHAZINE HCL 25 MG PO TABS
25.0000 mg | ORAL_TABLET | Freq: Four times a day (QID) | ORAL | Status: DC | PRN
  Administered 2016-01-06: 25 mg via ORAL
  Filled 2016-01-05: qty 1

## 2016-01-05 MED ORDER — MIDAZOLAM HCL 2 MG/2ML IJ SOLN
INTRAMUSCULAR | Status: AC
  Filled 2016-01-05: qty 2

## 2016-01-05 MED ORDER — PHENYLEPHRINE 40 MCG/ML (10ML) SYRINGE FOR IV PUSH (FOR BLOOD PRESSURE SUPPORT)
PREFILLED_SYRINGE | INTRAVENOUS | Status: AC
  Filled 2016-01-05: qty 10

## 2016-01-05 MED ORDER — PROPOFOL 10 MG/ML IV BOLUS
INTRAVENOUS | Status: DC | PRN
  Administered 2016-01-05: 200 mg via INTRAVENOUS

## 2016-01-05 MED ORDER — RIVAROXABAN 10 MG PO TABS
10.0000 mg | ORAL_TABLET | Freq: Every day | ORAL | Status: DC
Start: 2016-01-06 — End: 2016-01-07
  Administered 2016-01-06 – 2016-01-07 (×2): 10 mg via ORAL
  Filled 2016-01-05 (×2): qty 1

## 2016-01-05 MED ORDER — VITAMIN B-12 1000 MCG PO TABS
1000.0000 ug | ORAL_TABLET | Freq: Every day | ORAL | Status: DC
  Administered 2016-01-06 – 2016-01-07 (×2): 1000 ug via ORAL
  Filled 2016-01-05 (×2): qty 1

## 2016-01-05 MED ORDER — MIDAZOLAM HCL 5 MG/5ML IJ SOLN
INTRAMUSCULAR | Status: DC | PRN
  Administered 2016-01-05: 2 mg via INTRAVENOUS

## 2016-01-05 MED ORDER — OXYBUTYNIN CHLORIDE ER 10 MG PO TB24
10.0000 mg | ORAL_TABLET | Freq: Every day | ORAL | Status: DC
  Administered 2016-01-05 – 2016-01-06 (×2): 10 mg via ORAL
  Filled 2016-01-05 (×2): qty 1

## 2016-01-05 MED ORDER — ONDANSETRON HCL 4 MG PO TABS: 4.0000 mg | ORAL_TABLET | Freq: Four times a day (QID) | ORAL | Status: DC | PRN

## 2016-01-05 SURGICAL SUPPLY — 81 items
BANDAGE ACE 4X5 VEL STRL LF (GAUZE/BANDAGES/DRESSINGS) ×1 IMPLANT
BANDAGE ELASTIC 4 VELCRO ST LF (GAUZE/BANDAGES/DRESSINGS) ×2 IMPLANT
BANDAGE ELASTIC 6 VELCRO ST LF (GAUZE/BANDAGES/DRESSINGS) ×2 IMPLANT
BIT DRILL 2.7 QC CANN 155 (BIT) ×2 IMPLANT
BIT DRILL 3.5 QC 155 (BIT) ×1 IMPLANT
BLADE SURG 10 STRL SS (BLADE) IMPLANT
BNDG CMPR 9X4 STRL LF SNTH (GAUZE/BANDAGES/DRESSINGS) ×1
BNDG COHESIVE 6X5 TAN STRL LF (GAUZE/BANDAGES/DRESSINGS) ×2 IMPLANT
BNDG ESMARK 4X9 LF (GAUZE/BANDAGES/DRESSINGS) ×2 IMPLANT
BNDG GAUZE ELAST 4 BULKY (GAUZE/BANDAGES/DRESSINGS) ×2 IMPLANT
COVER MAYO STAND STRL (DRAPES) ×2 IMPLANT
COVER SURGICAL LIGHT HANDLE (MISCELLANEOUS) ×2 IMPLANT
CUFF TOURNIQUET SINGLE 34IN LL (TOURNIQUET CUFF) IMPLANT
CUFF TOURNIQUET SINGLE 44IN (TOURNIQUET CUFF) IMPLANT
DRAPE C-ARM 42X72 X-RAY (DRAPES) ×2 IMPLANT
DRAPE C-ARMOR (DRAPES) ×1 IMPLANT
DRAPE INCISE IOBAN 66X45 STRL (DRAPES) ×1 IMPLANT
DRAPE SURG 17X23 STRL (DRAPES) ×2 IMPLANT
DRAPE U-SHAPE 47X51 STRL (DRAPES) ×2 IMPLANT
DRILL CANNULA 2.7MM (BIT) ×1 IMPLANT
DRSG PAD ABDOMINAL 8X10 ST (GAUZE/BANDAGES/DRESSINGS) ×2 IMPLANT
DURAPREP 26ML APPLICATOR (WOUND CARE) ×1 IMPLANT
ELECT REM PT RETURN 9FT ADLT (ELECTROSURGICAL) ×2
ELECTRODE REM PT RTRN 9FT ADLT (ELECTROSURGICAL) ×1 IMPLANT
FACESHIELD WRAPAROUND (MASK) ×2 IMPLANT
FACESHIELD WRAPAROUND OR TEAM (MASK) ×1 IMPLANT
GAUZE SPONGE 4X4 12PLY STRL (GAUZE/BANDAGES/DRESSINGS) ×2 IMPLANT
GAUZE XEROFORM 5X9 LF (GAUZE/BANDAGES/DRESSINGS) ×2 IMPLANT
GLOVE BIOGEL PI IND STRL 6.5 (GLOVE) IMPLANT
GLOVE BIOGEL PI IND STRL 8 (GLOVE) ×1 IMPLANT
GLOVE BIOGEL PI INDICATOR 6.5 (GLOVE) ×1
GLOVE BIOGEL PI INDICATOR 8 (GLOVE) ×1
GLOVE SKINSENSE NS SZ6.5 (GLOVE) ×2
GLOVE SKINSENSE NS SZ8.0 LF (GLOVE) ×1
GLOVE SKINSENSE STRL SZ6.5 (GLOVE) IMPLANT
GLOVE SKINSENSE STRL SZ8.0 LF (GLOVE) IMPLANT
GLOVE SURG ORTHO 8.0 STRL STRW (GLOVE) ×2 IMPLANT
GOWN STRL REUS W/ TWL LRG LVL3 (GOWN DISPOSABLE) ×3 IMPLANT
GOWN STRL REUS W/TWL LRG LVL3 (GOWN DISPOSABLE) ×6
HANDPIECE INTERPULSE COAX TIP (DISPOSABLE)
K WIRE ×3 IMPLANT
K-WIRE (WIRE) ×3 IMPLANT
KIT BASIN OR (CUSTOM PROCEDURE TRAY) ×2 IMPLANT
KIT ROOM TURNOVER OR (KITS) ×2 IMPLANT
MANIFOLD NEPTUNE II (INSTRUMENTS) ×2 IMPLANT
NDL HYPO 25GX1X1/2 BEV (NEEDLE) ×1 IMPLANT
NEEDLE HYPO 25GX1X1/2 BEV (NEEDLE) ×2 IMPLANT
NS IRRIG 1000ML POUR BTL (IV SOLUTION) ×2 IMPLANT
PACK ORTHO EXTREMITY (CUSTOM PROCEDURE TRAY) ×2 IMPLANT
PAD ABD 8X10 STRL (GAUZE/BANDAGES/DRESSINGS) ×5 IMPLANT
PAD ARMBOARD 7.5X6 YLW CONV (MISCELLANEOUS) ×4 IMPLANT
PAD CAST 3X4 CTTN HI CHSV (CAST SUPPLIES) IMPLANT
PAD CAST 4YDX4 CTTN HI CHSV (CAST SUPPLIES) ×2 IMPLANT
PADDING CAST COTTON 3X4 STRL (CAST SUPPLIES) ×2
PADDING CAST COTTON 4X4 STRL (CAST SUPPLIES) ×2
PLATE FIBULA 5 HOLE (Plate) ×1 IMPLANT
SCREW CANC 5.0X14 (Screw) ×1 IMPLANT
SCREW CANNULATED 4.1X40 (Screw) ×1 IMPLANT
SCREW LOCK 12X3.5XST PRLC (Screw) IMPLANT
SCREW LOCK 3.5X12 (Screw) ×2 IMPLANT
SCREW LOCK 3.5X14 (Screw) ×2 IMPLANT
SCREW LOCK 3.5X8 (Screw) ×1 IMPLANT
SCREW NON LOCK 3.5X12 (Screw) ×1 IMPLANT
SCREW NON LOCK 3.5X20 (Screw) ×1 IMPLANT
SCREW NONLOCK 3.5X10 (Screw) ×1 IMPLANT
SET HNDPC FAN SPRY TIP SCT (DISPOSABLE) IMPLANT
SPLINT PLASTER CAST XFAST 5X30 (CAST SUPPLIES) IMPLANT
SPLINT PLASTER XFAST SET 5X30 (CAST SUPPLIES) ×1
STOCKINETTE IMPERVIOUS 9X36 MD (GAUZE/BANDAGES/DRESSINGS) ×1 IMPLANT
SUCTION FRAZIER HANDLE 10FR (MISCELLANEOUS) ×1
SUCTION TUBE FRAZIER 10FR DISP (MISCELLANEOUS) ×1 IMPLANT
SUT ETHILON 3 0 PS 1 (SUTURE) ×11 IMPLANT
SUT VIC AB 2-0 CTB1 (SUTURE) ×7 IMPLANT
SUT VIC AB 3-0 SH 27 (SUTURE) ×4
SUT VIC AB 3-0 SH 27X BRD (SUTURE) ×2 IMPLANT
SYR CONTROL 10ML LL (SYRINGE) ×2 IMPLANT
TOWEL OR 17X24 6PK STRL BLUE (TOWEL DISPOSABLE) ×2 IMPLANT
TOWEL OR 17X26 10 PK STRL BLUE (TOWEL DISPOSABLE) ×2 IMPLANT
TUBE CONNECTING 12X1/4 (SUCTIONS) ×2 IMPLANT
WATER STERILE IRR 1000ML POUR (IV SOLUTION) ×2 IMPLANT
YANKAUER SUCT BULB TIP NO VENT (SUCTIONS) ×1 IMPLANT

## 2016-01-05 NOTE — Anesthesia Preprocedure Evaluation (Signed)
Anesthesia Evaluation  Patient identified by MRN, date of birth, ID band Patient awake    Reviewed: Allergy & Precautions, NPO status , Patient's Chart, lab work & pertinent test results  History of Anesthesia Complications (+) PONV and history of anesthetic complications  Airway Mallampati: II  TM Distance: >3 FB Neck ROM: Full    Dental  (+) Missing, Partial Upper   Pulmonary neg shortness of breath, neg sleep apnea, neg COPD, neg recent URI, Current Smoker, neg PE   breath sounds clear to auscultation       Cardiovascular negative cardio ROS   Rhythm:Regular     Neuro/Psych  Headaches, neg Seizures PSYCHIATRIC DISORDERS Anxiety Depression    GI/Hepatic Neg liver ROS, PUD, GERD  Controlled,  Endo/Other  Morbid obesity  Renal/GU negative Renal ROS     Musculoskeletal   Abdominal   Peds  Hematology  (+) anemia ,   Anesthesia Other Findings   Reproductive/Obstetrics                             Anesthesia Physical Anesthesia Plan  ASA: II  Anesthesia Plan: General and Regional   Post-op Pain Management: MAC Combined w/ Regional for Post-op pain   Induction: Intravenous  Airway Management Planned: Oral ETT  Additional Equipment: None  Intra-op Plan:   Post-operative Plan: Extubation in OR  Informed Consent: I have reviewed the patients History and Physical, chart, labs and discussed the procedure including the risks, benefits and alternatives for the proposed anesthesia with the patient or authorized representative who has indicated his/her understanding and acceptance.   Dental advisory given  Plan Discussed with: CRNA and Surgeon  Anesthesia Plan Comments:         Anesthesia Quick Evaluation

## 2016-01-05 NOTE — Progress Notes (Signed)
Patient arrived in the unit at 5:20 pm accompanied by RN via bed. Patient stable. Patient drowsy  but easily arousable.Will continue to monitor.

## 2016-01-05 NOTE — H&P (Signed)
Diane [name] is an 35 y.o. female.   Chief Complaint: Right ankle pain HPI: Diane [name] is a 35 year old female with right ankle pain she sustained an injury from mechanical fall yesterday. No loss of consciousness. She is asked to seen in the [facility] emergency room diagnosed with minimally displaced right ankle fracture splinted and she was scheduled to come in to be seen Monday for surgery Tuesday. However last night in an attempt to negotiate crutches and stairs she had another fall. Had increased pain this morning and presents now for operative management of her right ankle fracture. She did stumble and land on the splinted right ankle. She reports some knee pain on the right and shoulder pain. There was no loss of consciousness with this fall. She is currently staying with her mother. Both husbands are traveling but will be back tomorrow morning. There is no family history of DVT or pulmonary embolism  Past Medical History  Diagnosis Date  . ADD (attention deficit disorder with hyperactivity)     no current med.  . Depression   . IBS (irritable bowel syndrome)   . PONV (postoperative nausea and vomiting)   . Migraine     sees Darrow Bussing NP   . Anemia   . GERD (gastroesophageal reflux disease)   . History of gastric ulcer   . Anxiety   . Family history of adverse reaction to anesthesia     pt's father has hx. of being hard to wake up post-op  . 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 History of kidney stones   . Wears partial dentures     upper  . History of kidney stones     Past Surgical History  Procedure Laterality Date  . Cesarean section  12/24/2009  . Cesarean section  11/20/2011    Procedure: CESAREAN SECTION;  Surgeon: Lenoard Aden, MD;  Location: [facility];  Service: Gynecology;  Laterality: N/A;  . Laparoscopic appendectomy N/A 06/11/2014    Procedure: APPENDECTOMY LAPAROSCOPIC;  Surgeon: Velora Heckler, MD;  Location: [facility];   Service: General;  Laterality: N/A;  . Bladder surgery      x 2 - as a child to stretch bladder  . Tonsillectomy and adenoidectomy    . Wisdom tooth extraction    . Hysteroscopy w/ endometrial ablation  03/05/2007  . Chromopertubation  03/05/2007  . Ovarian cyst surgery Right 03/05/2007  . Cystoscopy with retrograde pyelogram, ureteroscopy and stent placement Left 08/10/2006  . Synovectomy Right 12/13/2014    Procedure: PLICA SYNOVECTOMY;  Surgeon: Thera Flake., MD;  Location: [facility];  Service: Orthopedics;  Laterality: Right;  . Knee arthroscopy with lateral release Right 12/13/2014    Procedure: KNEE ARTHROSCOPY WITH LATERAL RELEASE;  Surgeon: Thera Flake., MD;  Location: [facility];  Service: Orthopedics;  Laterality: Right;  . Appendectomy      Family History  Problem Relation Age of Onset  . Brain cancer Father   . Anesthesia problems Father     hard to wake up post-op  . Lung cancer Father   . Breast cancer Maternal Grandmother   . Ovarian cancer Maternal Grandmother   . Diabetes Maternal Grandmother   . Heart disease Maternal Grandmother   . Irritable bowel syndrome Mother   . Prostate cancer Maternal Grandfather   . Melanoma Maternal Grandfather   . Colon cancer Neg Hx   . Esophageal cancer Neg  Hx   . Stomach cancer Neg Hx   . Rectal cancer Neg Hx    Social History:  reports that she has been smoking Cigarettes.  She has a 5 pack-year smoking history. She has never used smokeless tobacco. She reports that she drinks alcohol. She reports that she does not use illicit drugs.  Allergies:  Allergies  Allergen Reactions  . Nsaids Other (See Comments)    DUE TO HISTORY OF GASTRIC ULCER  . Latex Itching and Rash  . Metoclopramide Hcl Other (See Comments)    RESTLESSNESS/JITTERY  . Prochlorperazine Edisylate Other (See Comments)    RESTLESSNESS/JITTERY     Medications Prior to Admission  Medication Sig Dispense Refill  . dicyclomine  (BENTYL) 10 MG capsule Take 1 capsule (10 mg total) by mouth 3 (three) times daily before meals. 90 capsule 2  . gabapentin (NEURONTIN) 300 MG capsule Take 300 mg by mouth 2 (two) times daily.     Marland Kitchen labetalol (NORMODYNE) 200 MG tablet Take 0.5 tablets (100 mg total) by mouth 2 (two) times daily. 90 tablet 5  . venlafaxine XR (EFFEXOR-XR) 150 MG 24 hr capsule Take 300 mg by mouth daily.     . diphenoxylate-atropine (LOMOTIL) 2.5-0.025 MG per tablet Take 1 tablet by mouth 4 (four) times daily as needed for diarrhea or loose stools. 60 tablet 2  . Fe-Succ Ac-C-Thre Ac-B12-FA (FERREX 150 FORTE PLUS) 50-100 MG CAPS Take 150 mg by mouth 2 (two) times daily. 180 each 3  . LORazepam (ATIVAN) 1 MG tablet TAKE 1 TABLET BY MOUTH EVERY 6 HOURS AS NEEDED FOR ANXIETY 60 tablet 5  . Magnesium 400 MG TABS Take 1,200 mg by mouth daily.    . methocarbamol (ROBAXIN) 750 MG tablet Take 750 mg by mouth 2 (two) times daily as needed (migraines).   0  . oxybutynin (DITROPAN-XL) 10 MG 24 hr tablet Take 1 tablet (10 mg total) by mouth at bedtime. 30 tablet 11  . oxyCODONE-acetaminophen (PERCOCET/ROXICET) 5-325 MG tablet Take 1-2 tablets by mouth every 6 (six) hours as needed for moderate pain or severe pain. 10 tablet 0  . promethazine (PHENERGAN) 25 MG tablet Take 1 tablet (25 mg total) by mouth every 6 (six) hours as needed for nausea or vomiting. 60 tablet 5  . ranitidine (ZANTAC) 150 MG capsule Take 300 mg by mouth 2 (two) times daily.     Marland Kitchen spironolactone (ALDACTONE) 100 MG tablet Take 100 mg by mouth daily.    Marland Kitchen tiZANidine (ZANAFLEX) 4 MG tablet Take 4 tablets by mouth at bedtime as needed (migraines).   1  . topiramate (TOPAMAX) 200 MG tablet Take 500 mg by mouth daily.    . traMADol (ULTRAM) 50 MG tablet Take 2 tablets (100 mg total) by mouth every 6 (six) hours as needed for moderate pain. 120 tablet 0  . valACYclovir (VALTREX) 500 MG tablet Take 1 tablet (500 mg total) by mouth 2 (two) times daily. (Patient taking  differently: Take 500 mg by mouth 2 (two) times daily as needed. ) 60 tablet 5    No results found for this or any previous visit (from the past 48 hour(s)). Dg Tibia/fibula Right  01/04/2016  CLINICAL DATA:  Right ankle fracture. Tibia/ fibula bruising and pain. EXAM: RIGHT TIBIA AND FIBULA - 2 VIEW COMPARISON:  Right ankle radiographs from earlier today. FINDINGS: Re- demonstrated is a comminuted right lateral malleolus fracture with 4 mm posterior displacement of the dominant distal fracture fragment. Re- demonstrated is a  transverse right medial malleolus fracture with 3 mm distal displacement of the distal fracture fragment. Re- demonstrated is nondisplaced posterior malleolus fracture in the posterior right distal tibia. Diffuse right ankle soft tissue swelling. No additional fracture is seen in the right tibia or right fibula. No suspicious focal osseous lesion. No malalignment at the right knee on the provided views. No pathologic soft tissue calcifications. IMPRESSION: Re- demonstration of trimalleolar right ankle fractures. No additional abnormality in the right tibia or right fibula. Electronically Signed   By: Delbert Phenix M.D.   On: 01/04/2016 18:56   Dg Ankle Complete Right  01/04/2016  CLINICAL DATA:  Missed a step coming down stairs earlier today. Twisted right ankle. EXAM: RIGHT ANKLE - COMPLETE 3+ VIEW COMPARISON:  None. FINDINGS: There is diffuse soft tissue swelling. There is a comminuted fracture of the distal fibula. There is a fracture of the medial malleolus. Bone fragment along the medial aspect of the ankle. Intra-articular fracture along the posterior distal tibia. There is disruption of the ankle joint with widening of the anterior ankle joint on the lateral view. IMPRESSION: Comminuted trimalleolar ankle fracture with disruption of the ankle joint. Diffuse soft tissue swelling. Electronically Signed   By: Richarda Overlie M.D.   On: 01/04/2016 16:41   Dg Knee Complete 4 Views  Right  01/04/2016  CLINICAL DATA:  35 year old female with right knee and ankle pain after missing a step well walking down the stairs earlier today EXAM: RIGHT KNEE - COMPLETE 4+ VIEW COMPARISON:  Concurrently obtained radiographs of the right lower leg and ankle FINDINGS: There is no evidence of fracture, dislocation, or joint effusion. There is no evidence of arthropathy or other focal bone abnormality. Soft tissues are unremarkable. IMPRESSION: Negative. Electronically Signed   By: Malachy Moan M.D.   On: 01/04/2016 18:53    Review of Systems  Constitutional: Negative.   HENT: Negative.   Respiratory: Negative.   Cardiovascular: Negative.   Gastrointestinal: Negative.   Genitourinary: Negative.   Musculoskeletal: Positive for joint pain.  Skin: Negative.   Neurological: Negative.   Endo/Heme/Allergies: Negative.   Psychiatric/Behavioral: Negative.     Blood pressure 108/63, pulse 75, temperature 97.5 F (36.4 C), temperature source Oral, height 5' 5.5" (1.664 m), weight 88.451 kg (195 lb), last menstrual period 01/04/2016, SpO2 100 %. Physical Exam  Constitutional: She appears well-developed.  HENT:  Head: Normocephalic.  Eyes: Pupils are equal, round, and reactive to light.  Neck: Normal range of motion.  Cardiovascular: Normal rate.   Respiratory: Effort normal.  Neurological: She is alert.  Skin: Skin is warm.  Psychiatric: She has a normal mood and affect.   right ankle demonstrates some paresthesias on the dorsal aspect of the foot compartments are soft skin has some swelling but there is no fracture blisters on the medial or lateral side. Foot is perfused there is toe dorsi flexion and plantar flexion. The knee has an abrasion just inferior to the anterior lateral aspect of the patella there is no knee effusion extensor mechanism is intact no groin pain with internal/external rotation of the right leg right shoulders also examined has good range of motion passively and  actively without crepitus  Assessment/Plan Impression is right trimalleolar ankle fracture plan of reduction internal fixation medial and lateral malleolus possible posterior malleolus depending on the reduction. Risks and benefits discussed with the patient in female into infection or vessel damage ankle stiffness development medical arthritis. All questions answered. Anticipate the 3-4 weeks of nonweightbearing but  she should be up start ankle range of motion after the first week of splinting.Cammy Copa, MD 01/05/2016, 12:10 PM

## 2016-01-05 NOTE — Anesthesia Postprocedure Evaluation (Signed)
Anesthesia Post Note  Patient: Aniyia Rane  Procedure(s) Performed: Procedure(s) (LRB): OPEN REDUCTION INTERNAL FIXATION (ORIF) ANKLE FRACTURE (Right)  Patient location during evaluation: PACU Anesthesia Type: General and Regional Level of consciousness: awake Pain management: pain level controlled Vital Signs Assessment: post-procedure vital signs reviewed and stable Respiratory status: spontaneous breathing Cardiovascular status: stable Postop Assessment: no signs of nausea or vomiting Anesthetic complications: no    Last Vitals:  Filed Vitals:   01/05/16 1700 01/05/16 1723  BP:  96/55  Pulse: 79 65  Temp:  37.2 C  Resp: 17 17    Last Pain:  Filed Vitals:   01/05/16 1723  PainSc: 0-No pain                 Zyanya Glaza

## 2016-01-05 NOTE — Progress Notes (Signed)
Patient had wedding bands x 2 and earrings x 2 that was given to family prior to patient leaving short stay.

## 2016-01-05 NOTE — Anesthesia Procedure Notes (Addendum)
Anesthesia Regional Block:  Popliteal block  Pre-Anesthetic Checklist: ,, timeout performed, Correct Patient, Correct Site, Correct Laterality, Correct Procedure, Correct Position, site marked, Risks and benefits discussed,  Surgical consent,  Pre-op evaluation,  At surgeon's request and post-op pain management  Laterality: Lower and Right  Prep: chloraprep       Needles:  Injection technique: Single-shot  Needle Type: Echogenic Stimulator Needle          Additional Needles:  Procedures: ultrasound guided (picture in chart) and nerve stimulator Popliteal block  Nerve Stimulator or Paresthesia:  Response: plantar, 0.5 mA,   Additional Responses:   Narrative:  Injection made incrementally with aspirations every 5 mL.  Performed by: Personally  Anesthesiologist: MOSER, CHRISTOPHER  Additional Notes: H+P and labs reviewed, risks and benefits discussed with patient, procedure tolerated well without complications   Anesthesia Regional Block:  Adductor canal block  Pre-Anesthetic Checklist: ,, timeout performed, Correct Patient, Correct Site, Correct Laterality, Correct Procedure, Correct Position, site marked, Risks and benefits discussed,  Surgical consent,  Pre-op evaluation,  At surgeon's request and post-op pain management  Laterality: Lower and Right  Prep: chloraprep       Needles:  Injection technique: Single-shot  Needle Type: Echogenic Stimulator Needle          Additional Needles:  Procedures: ultrasound guided (picture in chart) Adductor canal block Narrative:  Injection made incrementally with aspirations every 5 mL.  Performed by: Personally  Anesthesiologist: MOSER, CHRISTOPHER  Additional Notes: H+P and labs reviewed, risks and benefits discussed with patient, procedure tolerated well without complications   Procedure Name: Intubation Date/Time: 01/05/2016 1:18 PM Performed by: Adonis Housekeeper Pre-anesthesia Checklist: Patient  identified, Emergency Drugs available, Suction available and Patient being monitored Patient Re-evaluated:Patient Re-evaluated prior to inductionOxygen Delivery Method: Circle system utilized Preoxygenation: Pre-oxygenation with 100% oxygen Intubation Type: IV induction Laryngoscope Size: Mac and 3 Grade View: Grade I Tube type: Oral Tube size: 7.0 mm Number of attempts: 1 Airway Equipment and Method: Stylet Placement Confirmation: ETT inserted through vocal cords under direct vision,  positive ETCO2 and breath sounds checked- equal and bilateral Secured at: 21 cm Tube secured with: Tape Dental Injury: Teeth and Oropharynx as per pre-operative assessment

## 2016-01-05 NOTE — Transfer of Care (Signed)
Immediate Anesthesia Transfer of Care Note  Patient: Diane [name]  Procedure(s) Performed: Procedure(s): OPEN REDUCTION INTERNAL FIXATION (ORIF) ANKLE FRACTURE (Right)  Patient Location: PACU  Anesthesia Type:General and GA combined with regional for post-op pain  Level of Consciousness: sedated  Airway & Oxygen Therapy: Patient Spontanous Breathing  Post-op Assessment: Report given to RN and Post -op Vital signs reviewed and stable  Post vital signs: Reviewed and stable  Last Vitals:  Filed Vitals:   01/05/16 1207  BP: 108/63  Pulse: 75  Temp: 36.4 C    Complications: No apparent anesthesia complications

## 2016-01-05 NOTE — Brief Op Note (Signed)
01/05/2016  3:53 PM  PATIENT:  Diane [name]  35 y.o. female  PRE-OPERATIVE DIAGNOSIS:  ankle fracture  POST-OPERATIVE DIAGNOSIS:  ankle fracture  PROCEDURE:  Procedure(s): OPEN REDUCTION INTERNAL FIXATION (ORIF) ANKLE FRACTURE  SURGEON:  Surgeon(s): Cammy Copa, MD  ASSISTANT: none  ANESTHESIA:   regional and general  EBL: 75 ml    Total I/O In: 3000 [I.V.:3000] Out: 50 [Blood:50]  BLOOD ADMINISTERED: none  DRAINS: none   LOCAL MEDICATIONS USED:  none  SPECIMEN:  No Specimen  COUNTS:  YES  TOURNIQUET:    DICTATION: .Other Dictation: Dictation Number [id number]  PLAN OF CARE: Admit for overnight observation  PATIENT DISPOSITION:  PACU - hemodynamically stable

## 2016-01-05 NOTE — Progress Notes (Signed)
Patient reports that she fell last night after getting home. Patient c/o additional knee and shoulder pain. Patient able to move shoulder with pain. Knee she reports having increased pain with movement and difficulty with movement. Spoke to Dr. August Saucer will re-xray right knee.

## 2016-01-06 DIAGNOSIS — S82851A Displaced trimalleolar fracture of right lower leg, initial encounter for closed fracture: Secondary | ICD-10-CM | POA: Diagnosis not present

## 2016-01-06 MED ORDER — HYDROMORPHONE HCL 1 MG/ML IJ SOLN
1.0000 mg | INTRAMUSCULAR | Status: DC | PRN
  Administered 2016-01-06 – 2016-01-07 (×7): 1 mg via INTRAVENOUS
  Filled 2016-01-06 (×8): qty 1

## 2016-01-06 MED ORDER — SODIUM CHLORIDE 0.9 % IV BOLUS (SEPSIS)
500.0000 mL | Freq: Once | INTRAVENOUS | Status: AC
  Administered 2016-01-06: 500 mL via INTRAVENOUS

## 2016-01-06 NOTE — Progress Notes (Signed)
Pt stable - pain control an issue Foot right perfused and sensate but preop dorsal paresthesias present still compts soft Plan for iv meds today as pain allows with bp trending lower On xarelto Anticipate dc tomorrow

## 2016-01-06 NOTE — Evaluation (Signed)
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  has past surgical history that includes Cesarean section (12/24/2009); Cesarean section (11/20/2011); laparoscopic appendectomy (N/A, 06/11/2014); Bladder surgery; Tonsillectomy and adenoidectomy; Wisdom tooth extraction; Hysteroscopy w/ endometrial ablation (03/05/2007); Chromopertubation (03/05/2007); Ovarian cyst surgery (Right, 03/05/2007); Cystoscopy with retrograde pyelogram, ureteroscopy and stent placement (Left, 08/10/2006); Synovectomy (Right, 12/13/2014); Knee arthroscopy with lateral release (Right, 12/13/2014); and Appendectomy.  Clinical Impression   Patient is s/p above surgery resulting in functional limitations due to the deficits listed below (see PT Problem List).  Patient will benefit from skilled PT to increase their independence and safety with mobility to allow discharge to the venue listed below.       Follow Up Recommendations Home health PT;Supervision for mobility/OOB    Equipment Recommendations  Rolling walker with 5" wheels;3in1 (PT);Wheelchair (measurements PT);Wheelchair cushion (measurements PT) (Secondary school teacher)    Recommendations for Other Services OT consult     Precautions / Restrictions Precautions Precautions: Fall Restrictions Weight Bearing Restrictions: Yes RLE Weight Bearing:  Non weight bearing      Mobility  Bed Mobility Overal bed mobility: Needs Assistance Bed Mobility: Supine to Sit     Supine to sit: Min assist     General bed mobility comments: Cues for technqiue; good performance of half bridge to EOB using LLE with excellent hip shift; able to push self to relative long sit without assist; assist provided at RLE -- had it on a pillow and cradled RLE, moving it to match Austina's movement  Transfers Overall transfer level: Needs assistance Equipment used: None Transfers: Squat Pivot Transfers     Squat pivot transfers: Min assist     General transfer comment: Min assist with squat pivot transfer bed to recliner, recliner placed on pt's L side; Angled recliner so that Aris could semi-stand on LLE during transfer and keep RLE on the bed; Pain precluded her letting her RLE into dependent position  Ambulation/Gait             General Gait Details: Ahsley declined amb today; Took the time to provide demo cues for technqiue in perp for gait training tomorrow  Stairs            Wheelchair Mobility    Modified Rankin (Stroke Patients Only)       Balance                                             Pertinent Vitals/Pain Pain Assessment: 0-10 Pain Score: 10-Worst pain ever Pain Location: R lower leg Pain Descriptors / Indicators: Aching;Constant;Crying;Discomfort;Grimacing;Guarding Pain Intervention(s): Limited activity within patient's tolerance;Monitored during session;Premedicated before session;Repositioned;Patient requesting pain meds-RN notified    Home Living Family/patient expects to be discharged to:: Private residence Living Arrangements: Spouse/significant other;Parent Available Help at Discharge: Family;Available PRN/intermittently Type of Home: House (Parents' home) Home Access: Ramped entrance  Home Layout: Two level;Able to live on main level with bedroom/bathroom (half bath)         Prior Function Level of Independence: Independent               Hand Dominance        Extremity/Trunk Assessment   Upper Extremity Assessment: Overall WFL for tasks assessed           Lower Extremity Assessment: RLE deficits/detail RLE Deficits / Details: Very hesitant to move RLE; supported RLE on pillows during transitional movements; positive toe wiggle and sensation intact to light touch       Communication   Communication: No difficulties  Cognition Arousal/Alertness: Awake/alert Behavior During Therapy: WFL for tasks assessed/performed;Anxious Overall Cognitive Status: Within Functional Limits for tasks assessed (very internally distracted by pain)                      General Comments General comments (skin integrity, edema, etc.): Very fearful of pain with movement    Exercises        Assessment/Plan    PT Assessment Patient needs continued PT services  PT Diagnosis Difficulty walking;Acute pain   PT Problem List Decreased strength;Decreased range of motion;Decreased activity tolerance;Decreased balance;Decreased mobility;Decreased knowledge of use of DME;Decreased knowledge of precautions;Pain  PT Treatment Interventions DME instruction;Gait training;Functional mobility training;Therapeutic activities;Therapeutic exercise;Patient/family education;Wheelchair mobility training   PT Goals (Current goals can be found in the Care Plan section) Acute Rehab PT Goals Patient Stated Goal: less pain PT Goal Formulation: With patient Time For Goal Achievement: 01/13/16 Potential to Achieve Goals: Good Additional Goals Additional Goal #1: Pt will independenly manage parts and propulsion of wheelchair    Frequency Min 6X/week   Barriers to discharge        Co-evaluation               End of Session   Activity Tolerance: Patient tolerated treatment well;Patient limited by pain Patient left: in chair;with call bell/phone within reach;with  family/visitor present Nurse Communication: Mobility status    Functional Assessment Tool Used: Clinical Judgement Functional Limitation: Mobility: Walking and moving around Mobility: Walking and Moving Around Current Status (Z6109): At least 20 percent but less than 40 percent impaired, limited or restricted Mobility: Walking and Moving Around Goal Status [phone]): 0 percent impaired, limited or restricted    Time: 0981-1914 PT Time Calculation (min) (ACUTE ONLY): 30 min   Charges:   PT Evaluation $PT Eval Moderate Complexity: 1 Procedure PT Treatments $Therapeutic Activity: 8-22 mins   PT G Codes:   PT G-Codes **NOT FOR INPATIENT CLASS** Functional Assessment Tool Used: Clinical Judgement Functional Limitation: Mobility: Walking and moving around Mobility: Walking and Moving Around Current Status (N8295): At least 20 percent but less than 40 percent impaired, limited or restricted Mobility: Walking and Moving Around Goal Status [phone]): 0 percent impaired, limited or restricted    Van Clines [facility] 01/06/2016, 12:16 PM  Van Clines, PT  Acute Rehabilitation Services Pager [phone] Office [phone]

## 2016-01-07 ENCOUNTER — Telehealth: Payer: Self-pay | Admitting: *Deleted

## 2016-01-07 ENCOUNTER — Encounter (HOSPITAL_COMMUNITY): Payer: Self-pay | Admitting: Orthopedic Surgery

## 2016-01-07 DIAGNOSIS — S82851A Displaced trimalleolar fracture of right lower leg, initial encounter for closed fracture: Secondary | ICD-10-CM | POA: Diagnosis not present

## 2016-01-07 NOTE — Discharge Instructions (Signed)
Information on my medicine - XARELTO® (Rivaroxaban) ° °This medication education was reviewed with me or my healthcare representative as part of my discharge preparation.  The pharmacist that spoke with me during my hospital stay was:  Shrika Milos Brown, RPH ° °Why was Xarelto® prescribed for you? °Xarelto® was prescribed for you to reduce the risk of blood clots forming after orthopedic surgery. The medical term for these abnormal blood clots is venous thromboembolism (VTE). ° °What do you need to know about xarelto® ? °Take your Xarelto® ONCE DAILY at the same time every day. °You may take it either with or without food. ° °If you have difficulty swallowing the tablet whole, you may crush it and mix in applesauce just prior to taking your dose. ° °Take Xarelto® exactly as prescribed by your doctor and DO NOT stop taking Xarelto® without talking to the doctor who prescribed the medication.  Stopping without other VTE prevention medication to take the place of Xarelto® may increase your risk of developing a clot. ° °After discharge, you should have regular check-up appointments with your healthcare provider that is prescribing your Xarelto®.   ° °What do you do if you miss a dose? °If you miss a dose, take it as soon as you remember on the same day then continue your regularly scheduled once daily regimen the next day. Do not take two doses of Xarelto® on the same day.  ° °Important Safety Information °A possible side effect of Xarelto® is bleeding. You should call your healthcare provider right away if you experience any of the following: °? Bleeding from an injury or your nose that does not stop. °? Unusual colored urine (red or dark brown) or unusual colored stools (red or black). °? Unusual bruising for unknown reasons. °? A serious fall or if you hit your head (even if there is no bleeding). ° °Some medicines may interact with Xarelto® and might increase your risk of bleeding while on Xarelto®. To help  avoid this, consult your healthcare provider or pharmacist prior to using any new prescription or non-prescription medications, including herbals, vitamins, non-steroidal anti-inflammatory drugs (NSAIDs) and supplements. ° °This website has more information on Xarelto®: www.xarelto.com. ° ° ° °

## 2016-01-07 NOTE — Op Note (Signed)
Diane [name], CIANO NO.:  [id number]  MEDICAL RECORD NO.:  [id number]  LOCATION:                                 FACILITY:  PHYSICIAN:  Burnard Bunting, M.D.    DATE OF BIRTH:  [birth year]  DATE OF PROCEDURE: DATE OF DISCHARGE:                              OPERATIVE REPORT   PREOPERATIVE DIAGNOSIS:  Right trimalleolar ankle fracture.  POSTOPERATIVE DIAGNOSIS:  Right trimalleolar ankle fracture.  PROCEDURE:  Open reduction and internal fixation right trimalleolar ankle fracture of the medial and lateral malleolus.  SURGEON:  Burnard Bunting, MD  ASSISTANT:  None.  ANESTHESIA:  General.  INDICATIONS:  Valeree is a 35 year old patient with right ankle fracture presents for operative management after explanation of risks and benefits.  PROCEDURE IN DETAIL:  Patient was brought to the operating room where general anesthetic was induced.  Preop regional anesthetic also been induced.  Right leg was prescrubbed with alcohol and Betadine, allowed to air dry, prepped with DuraPrep solution, draped in sterile manner. Collier Flowers was used to cover the operative field.  Time-out was called. Ankle Esmarch utilized for approximately an hour.  Leg was elevated and the ankle Esmarch was utilized.  Incision was made at the inferior aspect of the lateral malleolus extending proximally.  Skin and subcutaneous tissue were sharply divided.  Care was taken to avoid injury to superficial peroneal nerve.  Fracture was identified. Comminuted fracture of the lateral malleolus was present.  Fracture was reduced.  Comminution fracture was brought out the length.  A lag screw placed proximal anterior to posterior distal to maintain length.  Smith and Nephew 5-hole plate then applied with regional purchase obtained proximally and distally.  Blocking screws distally, nonlocking screws proximally.  Following this, the mortise was examined, found to be symmetric and stable.  There was more  stress and the syndesmosis was stable.  Attention was then directed to the medial thigh.  Incision was made over the medial malleolar fracture.  Skin and subcutaneous tissue was sharply divided.  Care taken to avoid injury to saphenous vein and its branches as well as saphenous nerve.  They were mobilized anteriorly.  Fracture was visualized, reduced, 2 pins placed, 4.0 x 40 partially threaded cannulated screws.  Thorough irrigation was then performed.  Reduction was checked under fluoroscopy in AP and lateral planes.  Good reduction of the mortise was achieved.  Thorough irrigation performed on both incisions.  Ankle Esmarch released.  Both incisions closed using interrupted inverted 2-0 Vicryl suture and 3-0 nylon.  A well-padded posterior splint was applied.  The patient tolerated the procedure well without immediate complications, transferred to recovery room in stable condition.     Burnard Bunting, M.D.     GSD/MEDQ  D:  01/05/2016  T:  01/05/2016  Job:  [id number]

## 2016-01-07 NOTE — Progress Notes (Signed)
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  has past surgical history that includes Cesarean section (12/24/2009); Cesarean section (11/20/2011); laparoscopic appendectomy (N/A, 06/11/2014); Bladder surgery; Tonsillectomy and adenoidectomy; Wisdom tooth extraction; Hysteroscopy w/ endometrial ablation (03/05/2007); Chromopertubation (03/05/2007); Ovarian cyst surgery (Right, 03/05/2007); Cystoscopy with retrograde pyelogram, ureteroscopy and stent placement (Left, 08/10/2006); Synovectomy (Right, 12/13/2014); Knee arthroscopy with lateral release (Right, 12/13/2014); and Appendectomy.    PT Comments    Laurel has made excellent progress with functional mobility and activity tolerance since yesterday; Able to perform stand pivot transfer and take a few pivot steps with RW, keeping NWB well;   Ramp is being installed today; Showed Mom wheelchair parts management;   OK for dc home from PT standpoint    Follow Up Recommendations  Home health PT;Supervision for mobility/OOB     Equipment Recommendations  Rolling walker with 5" wheels;3in1 (PT) (has wheelchair )    Recommendations for Other Services       Precautions / Restrictions Precautions Precautions: Fall Restrictions RLE Weight Bearing: Non weight bearing    Mobility  Bed Mobility Overal bed mobility: Needs  Assistance Bed Mobility: Supine to Sit     Supine to sit: Min assist     General bed mobility comments: Pt able to lead her care, and give me cues for managing her RLE  Transfers Overall transfer level: Needs assistance Equipment used: None;Rolling walker (2 wheeled) Transfers: Stand Pivot Transfers;Sit to/from Stand Sit to Stand: Min guard Stand pivot transfers: Min guard       General transfer comment: Cues for hand placement, and technique; Able to perform stand pivot transfer to bedside commode; then sit to stand from [facility]; Good control with stand to sit to recliner  Ambulation/Gait Ambulation/Gait assistance: Min assist Ambulation Distance (Feet): 2 Feet (pivot steps BSC to recliner with RW) Assistive device: Rolling walker (2 wheeled) Gait Pattern/deviations:  (hop-to)     General Gait Details: Good pressingof body weight onto RW to keep NWB RLE while advancing LLE   Stairs            Wheelchair Mobility    Modified Rankin (Stroke Patients Only)       Balance                                    Cognition Arousal/Alertness: Awake/alert (participating, but eyes closed a lot due to recent ativan) Behavior During Therapy: WFL for tasks assessed/performed Overall Cognitive Status: Within Functional Limits for tasks assessed                      Exercises      General Comments General comments (skin integrity, edema, etc.): HR 120 and BP 91/61 while sitting upright; asymptomatic for dizziness; educated pt and Mom on use of wheelchair and parts management      Pertinent Vitals/Pain Pain Assessment: Faces Faces Pain Scale: Hurts  even more Pain Location: RLE with movement Pain Descriptors / Indicators: Aching;Grimacing;Guarding Pain Intervention(s): Limited activity within patient's tolerance;Monitored during session;Premedicated before session;Repositioned    Home Living                      Prior Function             PT Goals (current goals can now be found in the care plan section) Acute Rehab PT Goals Patient Stated Goal: less pain PT Goal Formulation: With patient Time For Goal Achievement: 01/13/16 Potential to Achieve Goals: Good Progress towards PT goals: Progressing toward goals    Frequency  Min 6X/week    PT Plan Current plan remains appropriate    Co-evaluation             End of Session   Activity Tolerance: Patient tolerated treatment well Patient left: in chair;with call bell/phone within reach;with family/visitor present     Time: 1001-1048 PT Time Calculation (min) (ACUTE ONLY): 47 min  Charges:  $Gait Training: 8-22 mins $Therapeutic Activity: 8-22 mins $Self Care/Home Management: 8-22                    G Codes:      Van Clines [facility] 01/07/2016, 12:44 PM  Van Clines, PT  Acute Rehabilitation Services Pager [phone] Office [phone]

## 2016-01-07 NOTE — Progress Notes (Signed)
Pt ready for d/c home per MD. Cleared by PT, equipment has been delivered to pt's room. HH PT set up with [facility]. Discharge teaching and prescriptions reviewed with pt, her mother and husband, denied any questions.   Robie Creek, Latricia Heft

## 2016-01-07 NOTE — Care Management Note (Signed)
Case Management Note  Patient Details  Name: Carrine Kroboth MRN: [id number] Date of Birth: 11/24/1980  Subjective/Objective:       S/p right ankle ORIF             Action/Plan: Spoke with patient and her mother about discharge plan. Patient will be staying at her mother's home, [address] [place] [address]. They selected [facility]. Contacted Kahleb Mcclane at [place] and set up HHPT. Lother's address given to [place]. Patient has a wheelchair but will need rolling walker and 3N1. Contacted Jerm[facility] at [facility], rolling walker and 3N1 delivered to patient's room.         Expected Discharge Date:                  Expected Discharge Plan:  Home w Home Health Services  In-House Referral:  NA  Discharge planning Services  CM Consult  Post Acute Care Choice:  Durable Medical Equipment, Home Health Choice offered to:  Patient, Parent  DME Arranged:  3-N-1, Walker rolling DME Agency:  [facility]  HH Arranged:  PT HH Agency:  [facility]  Status of Service:  Completed, signed off  Medicare Important Message Given:    Date Medicare IM Given:    Medicare IM give by:    Date Additional Medicare IM Given:    Additional Medicare Important Message give by:     If discussed at Long Length of Stay Meetings, dates discussed:    Additional Comments:  Monica Becton, RN 01/07/2016, 11:31 AM

## 2016-01-07 NOTE — Telephone Encounter (Signed)
  Follow up Call-  Call back number 01/04/2016  Post procedure Call Back phone  # [phone]  Permission to leave phone message Yes     No answer at # given.  Left message on VM.

## 2016-01-08 ENCOUNTER — Telehealth: Payer: Self-pay | Admitting: Family Medicine

## 2016-01-08 NOTE — Discharge Summary (Signed)
Physician Discharge Summary  Patient ID: Diane [name] MRN: [id number] DOB/AGE: Jul 05, 1981 35 y.o.  Admit date: 01/05/2016 Discharge date: 01/07/2016  Admission Diagnoses:  Active Problems:   Ankle fracture   Discharge Diagnoses:  Same  Surgeries: Procedure(s): OPEN REDUCTION INTERNAL FIXATION (ORIF) ANKLE FRACTURE on 01/05/2016   Consultants:    Discharged Condition: Stable  Hospital Course: Diane [name] is an 35 y.o. female who was admitted 01/05/2016 with a chief complaint of ankle pain, and found to have a diagnosis of ankle fracture.  They were brought to the operating room on 01/05/2016 and underwent the above named procedures. Slow to mobilize with PT but did well on POD 2 . Dced home in good condition on xarelto for dvt prophylaxis   Antibiotics given:  Anti-infectives    Start     Dose/Rate Route Frequency Ordered Stop   01/05/16 2200  valACYclovir (VALTREX) tablet 500 mg  Status:  Discontinued     500 mg Oral 2 times daily 01/05/16 1728 01/06/16 1201   01/05/16 1900  ceFAZolin (ANCEF) IVPB 2 g/50 mL premix     2 g 100 mL/hr over 30 Minutes Intravenous Every 6 hours 01/05/16 1728 01/06/16 0130   01/05/16 1200  ceFAZolin (ANCEF) IVPB 2 g/50 mL premix     2 g 100 mL/hr over 30 Minutes Intravenous  Once 01/05/16 1141 01/05/16 1323    .  Recent vital signs:  Filed Vitals:   01/06/16 2351 01/07/16 0648  BP: 99/52 118/67  Pulse: 95 103  Temp: 100.1 F (37.8 C) 99.9 F (37.7 C)  Resp: 16 16    Recent laboratory studies:  Results for orders placed or performed in visit on 09/10/15  Basic metabolic panel  Result Value Ref Range   Sodium 139 135 - 145 mEq/L   Potassium 3.6 3.5 - 5.1 mEq/L   Chloride 109 96 - 112 mEq/L   CO2 22 19 - 32 mEq/L   Glucose, Bld 102 (H) 70 - 99 mg/dL   BUN 17 6 - 23 mg/dL   Creatinine, Ser 7.82 0.40 - 1.20 mg/dL   Calcium 9.4 8.4 - 95.6 mg/dL   GFR 21.30 >86.57 mL/min  TSH  Result Value Ref Range   TSH 1.35 0.35 - 4.50 uIU/mL   CBC with Differential/Platelet  Result Value Ref Range   WBC 10.0 4.0 - 10.5 K/uL   RBC 3.88 3.87 - 5.11 Mil/uL   Hemoglobin 11.7 (L) 12.0 - 15.0 g/dL   HCT 84.6 (L) 96.2 - 95.2 %   MCV 90.0 78.0 - 100.0 fl   MCHC 33.5 30.0 - 36.0 g/dL   RDW 84.1 32.4 - 40.1 %   Platelets 268.0 150.0 - 400.0 K/uL   Neutrophils Relative % 76.1 43.0 - 77.0 %   Lymphocytes Relative 17.6 12.0 - 46.0 %   Monocytes Relative 4.5 3.0 - 12.0 %   Eosinophils Relative 1.4 0.0 - 5.0 %   Basophils Relative 0.4 0.0 - 3.0 %   Neutro Abs 7.6 1.4 - 7.7 K/uL   Lymphs Abs 1.8 0.7 - 4.0 K/uL   Monocytes Absolute 0.5 0.1 - 1.0 K/uL   Eosinophils Absolute 0.1 0.0 - 0.7 K/uL   Basophils Absolute 0.0 0.0 - 0.1 K/uL  Hepatic function panel  Result Value Ref Range   Total Bilirubin 0.2 0.2 - 1.2 mg/dL   Bilirubin, Direct 0.0 0.0 - 0.3 mg/dL   Alkaline Phosphatase 79 39 - 117 U/L   AST 16 0 - 37 U/L  ALT 17 0 - 35 U/L   Total Protein 7.5 6.0 - 8.3 g/dL   Albumin 4.3 3.5 - 5.2 g/dL  Lipase  Result Value Ref Range   Lipase 45.0 11.0 - 59.0 U/L  Tissue transglutaminase, IgA  Result Value Ref Range   Tissue Transglutaminase Ab, IgA 1 <4 U/mL  IgA  Result Value Ref Range   IgA 200 68 - 378 mg/dL    Discharge Medications:     Medication List    STOP taking these medications        methocarbamol 750 MG tablet  Commonly known as:  ROBAXIN     oxyCODONE-acetaminophen 5-325 MG tablet  Commonly known as:  PERCOCET/ROXICET  Replaced by:  oxyCODONE-acetaminophen 10-325 MG tablet     traMADol 50 MG tablet  Commonly known as:  ULTRAM      TAKE these medications        dicyclomine 10 MG capsule  Commonly known as:  BENTYL  Take 1 capsule (10 mg total) by mouth 3 (three) times daily before meals.     diphenoxylate-atropine 2.5-0.025 MG tablet  Commonly known as:  LOMOTIL  Take 1 tablet by mouth 4 (four) times daily as needed for diarrhea or loose stools.     FERREX 150 FORTE PLUS 50-100 MG Caps  Take 150  mg by mouth 2 (two) times daily.     gabapentin 300 MG capsule  Commonly known as:  NEURONTIN  Take 300 mg by mouth 2 (two) times daily.     labetalol 200 MG tablet  Commonly known as:  NORMODYNE  Take 0.5 tablets (100 mg total) by mouth 2 (two) times daily.     LORazepam 1 MG tablet  Commonly known as:  ATIVAN  TAKE 1 TABLET BY MOUTH EVERY 6 HOURS AS NEEDED FOR ANXIETY     Magnesium 400 MG Tabs  Take 1,200 mg by mouth daily.     oxybutynin 10 MG 24 hr tablet  Commonly known as:  DITROPAN-XL  Take 1 tablet (10 mg total) by mouth at bedtime.     oxyCODONE-acetaminophen 10-325 MG tablet  Commonly known as:  PERCOCET  Take 1-2 tablets by mouth every 4 (four) hours as needed for pain.     promethazine 25 MG tablet  Commonly known as:  PHENERGAN  Take 1 tablet (25 mg total) by mouth every 6 (six) hours as needed for nausea or vomiting.     ranitidine 150 MG capsule  Commonly known as:  ZANTAC  Take 300 mg by mouth 2 (two) times daily.     rivaroxaban 10 MG Tabs tablet  Commonly known as:  XARELTO  Take 1 tablet (10 mg total) by mouth daily.     spironolactone 100 MG tablet  Commonly known as:  ALDACTONE  Take 100 mg by mouth daily.     tiZANidine 4 MG tablet  Commonly known as:  ZANAFLEX  Take 4 tablets by mouth at bedtime as needed (migraines).     TOPAMAX 200 MG tablet  Generic drug:  topiramate  Take 500 mg by mouth daily.     valACYclovir 500 MG tablet  Commonly known as:  VALTREX  Take 1 tablet (500 mg total) by mouth 2 (two) times daily.     venlafaxine XR 150 MG 24 hr capsule  Commonly known as:  EFFEXOR-XR  Take 300 mg by mouth daily.     vitamin B-12 1000 MCG tablet  Commonly known as:  CYANOCOBALAMIN  Take 1,000  mcg by mouth daily.        Diagnostic Studies: Dg Tibia/fibula Right  01/04/2016  CLINICAL DATA:  Right ankle fracture. Tibia/ fibula bruising and pain. EXAM: RIGHT TIBIA AND FIBULA - 2 VIEW COMPARISON:  Right ankle radiographs from  earlier today. FINDINGS: Re- demonstrated is a comminuted right lateral malleolus fracture with 4 mm posterior displacement of the dominant distal fracture fragment. Re- demonstrated is a transverse right medial malleolus fracture with 3 mm distal displacement of the distal fracture fragment. Re- demonstrated is nondisplaced posterior malleolus fracture in the posterior right distal tibia. Diffuse right ankle soft tissue swelling. No additional fracture is seen in the right tibia or right fibula. No suspicious focal osseous lesion. No malalignment at the right knee on the provided views. No pathologic soft tissue calcifications. IMPRESSION: Re- demonstration of trimalleolar right ankle fractures. No additional abnormality in the right tibia or right fibula. Electronically Signed   By: Delbert Phenix M.D.   On: 01/04/2016 18:56   Dg Ankle 2 Views Right  01/05/2016  CLINICAL DATA:  35 year old female undergoing ORIF of right ankle fracture. EXAM: DG C-ARM 61-120 MIN; RIGHT ANKLE - 2 VIEW COMPARISON:  Preoperative radiographs 01/03/2015 FINDINGS: A total of 4 intraoperative spot radiographs demonstrate open reduction and internal fixation of the previously noted distal fibular and medial malleolar fractures. The distal fibular fracture is now transfixed by a lateral buttress plate and screw construct with an additional inter fragmentary screw. A single cannulated lag screw transfixes the medial malleolar fracture. The posterior distal tibial fracture is again noted. Alignment of this fracture fragment is improved suggesting closed reduction. No evidence of acute hardware complication. IMPRESSION: ORIF of distal fibular and medial malleolar fractures as above without evidence of hardware complication. Improved alignment of the posterior distal tibial fracture. Electronically Signed   By: Malachy Moan M.D.   On: 01/05/2016 15:29   Dg Ankle Complete Right  01/04/2016  CLINICAL DATA:  Missed a step coming down  stairs earlier today. Twisted right ankle. EXAM: RIGHT ANKLE - COMPLETE 3+ VIEW COMPARISON:  None. FINDINGS: There is diffuse soft tissue swelling. There is a comminuted fracture of the distal fibula. There is a fracture of the medial malleolus. Bone fragment along the medial aspect of the ankle. Intra-articular fracture along the posterior distal tibia. There is disruption of the ankle joint with widening of the anterior ankle joint on the lateral view. IMPRESSION: Comminuted trimalleolar ankle fracture with disruption of the ankle joint. Diffuse soft tissue swelling. Electronically Signed   By: Richarda Overlie M.D.   On: 01/04/2016 16:41   Dg Knee Complete 4 Views Right  01/04/2016  CLINICAL DATA:  35 year old female with right knee and ankle pain after missing a step well walking down the stairs earlier today EXAM: RIGHT KNEE - COMPLETE 4+ VIEW COMPARISON:  Concurrently obtained radiographs of the right lower leg and ankle FINDINGS: There is no evidence of fracture, dislocation, or joint effusion. There is no evidence of arthropathy or other focal bone abnormality. Soft tissues are unremarkable. IMPRESSION: Negative. Electronically Signed   By: Malachy Moan M.D.   On: 01/04/2016 18:53   Dg Knee Right Port  01/05/2016  CLINICAL DATA:  Pt was scheduled for ankle surgery for previous fx this Tuesday. However, pt had a mechanical fall yesterday further injuring ankle so pt is pre-op now. Pt also experienced a knee injury during her fall. Pt has bruising to her anterior knee on the lateral side of the patella. Pt  states she has moderate pain at her knee but her ankle pain distracts from her knee. EXAM: PORTABLE RIGHT KNEE - 1-2 VIEW COMPARISON:  None. FINDINGS: There is no evidence of fracture, dislocation, or joint effusion. There is no evidence of arthropathy or other focal bone abnormality. Soft tissues are unremarkable. IMPRESSION: Negative. Electronically Signed   By: Amie Portland M.D.   On: 01/05/2016  12:30   Dg Ankle Right Port  01/05/2016  CLINICAL DATA:  ORIF trimalleolar right ankle fractures EXAM: PORTABLE RIGHT ANKLE - 2 VIEW COMPARISON:  Right ankle radiographs from 1 day prior FINDINGS: Overlying cast obscures fine bone detail. Status post transfixation of medial malleolus fracture in anatomic alignment by 2 screws, with no screw fracture or loosening. Status post transfixation of lateral malleolus fracture in near-anatomic alignment by bilateral surgical plate with multiple interlocking screws and single non interlocking screw, with no hardware fracture or loosening. Known posterior malleolus fracture in the right distal tibia is obscured on lateral view by overlying surgical plate and appears nondisplaced. No subluxation at the right ankle mortise. IMPRESSION: 1. Anatomic alignment of medial malleolus fracture status post ORIF, with no hardware complication. 2. Near-anatomic alignment of lateral malleolus fracture status post ORIF, with no hardware complication. 3. Obscured posterior malleolus fracture in the right distal tibia by overlying hardware, which appears nondisplaced. Electronically Signed   By: Delbert Phenix M.D.   On: 01/05/2016 18:07   Dg C-arm 61-120 Min  01/05/2016  CLINICAL DATA:  35 year old female undergoing ORIF of right ankle fracture. EXAM: DG C-ARM 61-120 MIN; RIGHT ANKLE - 2 VIEW COMPARISON:  Preoperative radiographs 01/03/2015 FINDINGS: A total of 4 intraoperative spot radiographs demonstrate open reduction and internal fixation of the previously noted distal fibular and medial malleolar fractures. The distal fibular fracture is now transfixed by a lateral buttress plate and screw construct with an additional inter fragmentary screw. A single cannulated lag screw transfixes the medial malleolar fracture. The posterior distal tibial fracture is again noted. Alignment of this fracture fragment is improved suggesting closed reduction. No evidence of acute hardware complication.  IMPRESSION: ORIF of distal fibular and medial malleolar fractures as above without evidence of hardware complication. Improved alignment of the posterior distal tibial fracture. Electronically Signed   By: Malachy Moan M.D.   On: 01/05/2016 15:29    Disposition: 01-Home or Self Care      Discharge Instructions    Call MD / Call 911    Complete by:  As directed   If you experience chest pain or shortness of breath, CALL 911 and be transported to the hospital emergency room.  If you develope a fever above 101 F, pus (white drainage) or increased drainage or redness at the wound, or calf pain, call your surgeon's office.     Call MD / Call 911    Complete by:  As directed   If you experience chest pain or shortness of breath, CALL 911 and be transported to the hospital emergency room.  If you develope a fever above 101 F, pus (white drainage) or increased drainage or redness at the wound, or calf pain, call your surgeon's office.     Call MD / Call 911    Complete by:  As directed   If you experience chest pain or shortness of breath, CALL 911 and be transported to the hospital emergency room.  If you develope a fever above 101 F, pus (white drainage) or increased drainage or redness at the wound, or  calf pain, call your surgeon's office.     Constipation Prevention    Complete by:  As directed   Drink plenty of fluids.  Prune juice may be helpful.  You may use a stool softener, such as Colace (over the counter) 100 mg twice a day.  Use MiraLax (over the counter) for constipation as needed.     Constipation Prevention    Complete by:  As directed   Drink plenty of fluids.  Prune juice may be helpful.  You may use a stool softener, such as Colace (over the counter) 100 mg twice a day.  Use MiraLax (over the counter) for constipation as needed.     Constipation Prevention    Complete by:  As directed   Drink plenty of fluids.  Prune juice may be helpful.  You may use a stool softener, such as  Colace (over the counter) 100 mg twice a day.  Use MiraLax (over the counter) for constipation as needed.     Diet - low sodium heart healthy    Complete by:  As directed      Diet - low sodium heart healthy    Complete by:  As directed      Diet - low sodium heart healthy    Complete by:  As directed      Discharge instructions    Complete by:  As directed   Nonweightbearing right leg Elevate right leg Keep splint dry     Discharge instructions    Complete by:  As directed   Elevate right leg Non weight bearing     Increase activity slowly as tolerated    Complete by:  As directed      Increase activity slowly as tolerated    Complete by:  As directed      Increase activity slowly as tolerated    Complete by:  As directed      Non weight bearing    Complete by:  As directed            Follow-up Information    Follow up with [facility].   Why:  They will contact you to schedule home therapy visits.    Contact information:   [address] [place] [address] [phone]        Signed: Cammy Copa 01/08/2016, 3:07 PM

## 2016-01-08 NOTE — Telephone Encounter (Signed)
Marcelino Duster with [facility]  PT would like a call back concerning pt's meds. Specifically the bp med she is on.

## 2016-01-09 ENCOUNTER — Encounter: Payer: Self-pay | Admitting: Gastroenterology

## 2016-01-09 NOTE — Telephone Encounter (Signed)
Her BP is stable so stay off the Labetalol. No need for [place] to follow up the ER visit because she broke her ankle. She would need only orthopedic follow up

## 2016-01-09 NOTE — Telephone Encounter (Signed)
I spoke with pt and she was advised to stop the Labetolol on 01/04/2016, due to low readings. She recently broke her ankle and went to ER last weekend. Today at 1:30 pm blood pressure was 101/59. Do you recommend she stay off the medication for now and when should she come in for ER follow up?

## 2016-01-10 NOTE — Telephone Encounter (Signed)
I spoke with pt  

## 2016-01-15 ENCOUNTER — Telehealth: Payer: Self-pay | Admitting: Family Medicine

## 2016-01-15 NOTE — Telephone Encounter (Signed)
Marcelino Duster from [facility] called stated that patient b/p was 86/56 she was complaining of some dizziness only when she move around, she suggested that patient drink more water, elevate her ankle and not walk around. FYI  Any question call [phone]

## 2016-01-16 ENCOUNTER — Encounter (HOSPITAL_COMMUNITY): Payer: Self-pay | Admitting: Orthopedic Surgery

## 2016-01-22 ENCOUNTER — Encounter (HOSPITAL_COMMUNITY): Payer: Self-pay | Admitting: Orthopedic Surgery

## 2016-01-24 ENCOUNTER — Ambulatory Visit
Admission: RE | Admit: 2016-01-24 | Discharge: 2016-01-24 | Disposition: A | Discharge status: Home / Self Care | Payer: BLUE CROSS/BLUE SHIELD | Source: Ambulatory Visit | Attending: Orthopedic Surgery | Admitting: Orthopedic Surgery

## 2016-01-24 ENCOUNTER — Other Ambulatory Visit: Payer: Self-pay | Admitting: Orthopedic Surgery

## 2016-01-24 DIAGNOSIS — R52 Pain, unspecified: Secondary | ICD-10-CM

## 2016-01-24 DIAGNOSIS — R609 Edema, unspecified: Secondary | ICD-10-CM

## 2016-02-11 ENCOUNTER — Encounter (HOSPITAL_COMMUNITY): Payer: Self-pay | Admitting: Orthopedic Surgery

## 2016-02-18 ENCOUNTER — Telehealth: Payer: Self-pay | Admitting: Family Medicine

## 2016-02-18 NOTE — Telephone Encounter (Signed)
Refill request for Tramadol and send to [facility].

## 2016-02-18 NOTE — Telephone Encounter (Signed)
Call in #[phone] with no rf 

## 2016-02-19 MED ORDER — TRAMADOL HCL 50 MG PO TABS: 50.0000 mg | ORAL_TABLET | Freq: Four times a day (QID) | ORAL | Status: DC | PRN

## 2016-02-19 NOTE — Telephone Encounter (Signed)
Rx was faxed.

## 2016-02-21 ENCOUNTER — Telehealth: Payer: Self-pay | Admitting: Family Medicine

## 2016-02-21 NOTE — Telephone Encounter (Signed)
Pt states the pharmacy never received the traMADol (ULTRAM) 50 MG tablet / 120 tabs. [facility]

## 2016-02-22 NOTE — Telephone Encounter (Signed)
Rx was faxed April 4th. Also i called in today again

## 2016-03-27 ENCOUNTER — Other Ambulatory Visit: Payer: Self-pay | Admitting: Family Medicine

## 2016-03-27 MED ORDER — TRAMADOL HCL 50 MG PO TABS: 50.0000 mg | ORAL_TABLET | Freq: Four times a day (QID) | ORAL | Status: DC | PRN

## 2016-03-27 NOTE — Telephone Encounter (Signed)
Call in #[phone] with 2 rf 

## 2016-04-11 ENCOUNTER — Other Ambulatory Visit: Payer: Self-pay | Admitting: Family Medicine

## 2016-04-11 ENCOUNTER — Telehealth: Payer: Self-pay | Admitting: Family Medicine

## 2016-04-11 MED ORDER — LORAZEPAM 1 MG PO TABS
1.0000 mg | ORAL_TABLET | Freq: Four times a day (QID) | ORAL | Status: DC | PRN
Start: 2016-04-11 — End: 2016-05-02

## 2016-04-11 NOTE — Telephone Encounter (Signed)
Call in #60 with 5 rf 

## 2016-04-11 NOTE — Telephone Encounter (Signed)
Pt request refill of the following:  LORazepam (ATIVAN) 1 MG tablet   Phamacy: [facility]

## 2016-04-11 NOTE — Telephone Encounter (Signed)
I called in script 

## 2016-04-17 ENCOUNTER — Ambulatory Visit (INDEPENDENT_AMBULATORY_CARE_PROVIDER_SITE_OTHER): Payer: BLUE CROSS/BLUE SHIELD | Admitting: Family Medicine

## 2016-04-17 ENCOUNTER — Encounter: Payer: Self-pay | Admitting: Family Medicine

## 2016-04-17 VITALS — BP 102/78 | HR 111 | Temp 98.7°F | Ht 65.5 in

## 2016-04-17 DIAGNOSIS — J069 Acute upper respiratory infection, unspecified: Secondary | ICD-10-CM

## 2016-04-17 MED ORDER — HYDROCOD POLST-CPM POLST ER 10-8 MG/5ML PO SUER: 5.0000 mL | Freq: Two times a day (BID) | ORAL | Status: DC | PRN

## 2016-04-17 NOTE — Patient Instructions (Signed)
INSTRUCTIONS FOR UPPER RESPIRATORY INFECTION:  -nasal saline wash 2-3 times daily (use prepackaged nasal saline or bottled/distilled water if making your own)   -can use AFRIN nasal spray for drainage and nasal congestion - but do NOT use longer then 3-4 days  -can use tylenol (in no history of liver disease) or ibuprofen (if no history of kidney disease, bowel bleeding or significant heart disease) as directed for aches and sorethroat  -if you are taking a cough medication - use only as directed, may also try a teaspoon of honey to coat the throat and throat lozenges. If given a cough medication with codeine or hydrocodone or other narcotic please be advised that this contains a strong and  potentially addicting medication. Please follow instructions carefully, take as little as possible and only use AS NEEDED for severe cough. Discuss potential side effects with your pharmacy. Please do not drive or operate machinery while taking these types of medications. Please do not take other sedating medications, drugs or alcohol while taking this medication without discussing with your doctor.  -for sore throat, salt water gargles can help  -follow up if you have fevers, facial pain, tooth pain, difficulty breathing or are worsening or symptoms persist longer then expected  Upper Respiratory Infection, Adult An upper respiratory infection (URI) is also known as the common cold. It is often caused by a type of germ (virus). Colds are easily spread (contagious). You can pass it to others by kissing, coughing, sneezing, or drinking out of the same glass. Usually, you get better in 1 to 3  weeks.  However, the cough can last for even longer. HOME CARE   Only take medicine as told by your doctor. Follow instructions provided above.  Drink enough water and fluids to keep your pee (urine) clear or pale yellow.  Get plenty of rest.  Return to work when your temperature is < 100 for 24 hours or as told by  your doctor. You may use a face mask and wash your hands to stop your cold from spreading. GET HELP RIGHT AWAY IF:   After the first few days, you feel you are getting worse.  You have questions about your medicine.  You have chills, shortness of breath, or red spit (mucus).  You have pain in the face for more then 1-2 days, especially when you bend forward.  You have a fever, puffy (swollen) neck, pain when you swallow, or white spots in the back of your throat.  You have a bad headache, ear pain, sinus pain, or chest pain.  You have a high-pitched whistling sound when you breathe in and out (wheezing).  You cough up blood.  You have sore muscles or a stiff neck. MAKE SURE YOU:   Understand these instructions.  Will watch your condition.  Will get help right away if you are not doing well or get worse. Document Released: 04/21/2008 Document Revised: 01/26/2012 Document Reviewed: 02/08/2014 [facility] Patient Information 2015 [place], [place]. This information is not intended to replace advice given to you by your health care provider. Make sure you discuss any questions you have with your health care provider.

## 2016-04-17 NOTE — Progress Notes (Signed)
Pre visit review using our clinic review tool, if applicable. No additional management support is needed unless otherwise documented below in the visit note. 

## 2016-04-17 NOTE — Progress Notes (Signed)
HPI:  Acute visit for:  URI: -started: yesterday -symptoms:nasal congestion, sore throat, cough, ears full, low grade temp yesterday -denies:fever, SOB, NVD, tooth pain -has tried: OTC cold medication -sick contacts/travel/risks: no reported flu, strep or tick exposure - family member with similar symptoms -reports PCP always gives her tussionex for cough when she is sick and this is the only thing that helps; she reports she is only using tramadol, muscle relaxer, ativan prn and rarely and does not need to use on a daily basis so can avoid use with the cough syrup ROS: See pertinent positives and negatives per HPI.  Past Medical History  Diagnosis Date  . ADD (attention deficit disorder with hyperactivity)     no current med.  . Depression   . IBS (irritable bowel syndrome)   . PONV (postoperative nausea and vomiting)   . Migraine     sees Darrow Bussing NP   . Anemia   . GERD (gastroesophageal reflux disease)   . History of gastric ulcer   . Anxiety   . Family history of adverse reaction to anesthesia     pt's father has hx. of being hard to wake up post-op  . 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 History of kidney stones   . Wears partial dentures     upper  . History of kidney stones     Past Surgical History  Procedure Laterality Date  . Cesarean section  12/24/2009  . Cesarean section  11/20/2011    Procedure: CESAREAN SECTION;  Surgeon: Lenoard Aden, MD;  Location: [facility];  Service: Gynecology;  Laterality: N/A;  . Laparoscopic appendectomy N/A 06/11/2014    Procedure: APPENDECTOMY LAPAROSCOPIC;  Surgeon: Velora Heckler, MD;  Location: [facility];  Service: General;  Laterality: N/A;  . Bladder surgery      x 2 - as a child to stretch bladder  . Tonsillectomy and adenoidectomy    . Wisdom tooth extraction    . Hysteroscopy w/ endometrial ablation  03/05/2007  . Chromopertubation  03/05/2007  . Ovarian cyst surgery Right  03/05/2007  . Cystoscopy with retrograde pyelogram, ureteroscopy and stent placement Left 08/10/2006  . Synovectomy Right 12/13/2014    Procedure: PLICA SYNOVECTOMY;  Surgeon: Thera Flake., MD;  Location: [facility];  Service: Orthopedics;  Laterality: Right;  . Knee arthroscopy with lateral release Right 12/13/2014    Procedure: KNEE ARTHROSCOPY WITH LATERAL RELEASE;  Surgeon: Thera Flake., MD;  Location: [facility];  Service: Orthopedics;  Laterality: Right;  . Appendectomy    . Orif ankle fracture Right 01/05/2016    Procedure: OPEN REDUCTION INTERNAL FIXATION (ORIF) ANKLE FRACTURE;  Surgeon: Cammy Copa, MD;  Location: [facility];  Service: Orthopedics;  Laterality: Right;    Family History  Problem Relation Age of Onset  . Brain cancer Father   . Anesthesia problems Father     hard to wake up post-op  . Lung cancer Father   . Breast cancer Maternal Grandmother   . Ovarian cancer Maternal Grandmother   . Diabetes Maternal Grandmother   . Heart disease Maternal Grandmother   . Irritable bowel syndrome Mother   . Prostate cancer Maternal Grandfather   . Melanoma Maternal Grandfather   . Colon cancer Neg Hx   . Esophageal cancer Neg Hx   . Stomach cancer Neg Hx   . Rectal cancer Neg Hx  Social History   Social History  . Marital Status: Married    Spouse Name: N/A  . Number of Children: 2  . 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 Smoking status: Current Every Day Smoker -- 0.50 packs/day for 10 years    Types: Cigarettes  . Smokeless tobacco: Never Used     Comment: or less  . Alcohol Use: 0.0 oz/week    0 Standard drinks or equivalent per week     Comment: rare  . Drug Use: No  . Sexual Activity: Yes   Other Topics Concern  . None   Social History Narrative     Current outpatient prescriptions:  .  dicyclomine (BENTYL) 10 MG capsule, Take 1 capsule  (10 mg total) by mouth 3 (three) times daily before meals., Disp: 90 capsule, Rfl: 2 .  diphenoxylate-atropine (LOMOTIL) 2.5-0.025 MG per tablet, Take 1 tablet by mouth 4 (four) times daily as needed for diarrhea or loose stools., Disp: 60 tablet, Rfl: 2 .  Fe-Succ Ac-C-Thre Ac-B12-FA (FERREX 150 FORTE PLUS) 50-100 MG CAPS, Take 150 mg by mouth 2 (two) times daily., Disp: 180 each, Rfl: 3 .  gabapentin (NEURONTIN) 300 MG capsule, Take 300 mg by mouth 2 (two) times daily. Takes 300mg  every morning, 600mg  at bedtime, Disp: , Rfl:  .  LORazepam (ATIVAN) 1 MG tablet, Take 1 tablet (1 mg total) by mouth every 6 (six) hours as needed. for anxiety, Disp: 60 tablet, Rfl: 5 .  Magnesium 400 MG TABS, Take 1,200 mg by mouth daily., Disp: , Rfl:  .  oxybutynin (DITROPAN-XL) 10 MG 24 hr tablet, Take 1 tablet (10 mg total) by mouth at bedtime., Disp: 30 tablet, Rfl: 11 .  promethazine (PHENERGAN) 25 MG tablet, Take 1 tablet (25 mg total) by mouth every 6 (six) hours as needed for nausea or vomiting., Disp: 60 tablet, Rfl: 5 .  ranitidine (ZANTAC) 150 MG capsule, Take 300 mg by mouth 2 (two) times daily. , Disp: , Rfl:  .  rivaroxaban (XARELTO) 10 MG TABS tablet, Take 1 tablet (10 mg total) by mouth daily., Disp: 12 tablet, Rfl: 0 .  tiZANidine (ZANAFLEX) 4 MG tablet, Take 4 tablets by mouth at bedtime as needed (migraines). , Disp: , Rfl: 1 .  traMADol (ULTRAM) 50 MG tablet, Take 1 tablet (50 mg total) by mouth every 6 (six) hours as needed., Disp: 120 tablet, Rfl: 2 .  venlafaxine XR (EFFEXOR-XR) 150 MG 24 hr capsule, Take 300 mg by mouth daily. , Disp: , Rfl:  .  vitamin B-12 (CYANOCOBALAMIN) 1000 MCG tablet, Take 1,000 mcg by mouth daily., Disp: , Rfl:  .  chlorpheniramine-HYDROcodone (TUSSIONEX PENNKINETIC ER) 10-8 MG/5ML SUER, Take 5 mLs by mouth every 12 (twelve) hours as needed for cough., Disp: 115 mL, Rfl: 0  EXAM:  Filed Vitals:   04/17/16 1507  BP: 102/78  Pulse: 111  Temp: 98.7 F (37.1 C)     There is no weight on file to calculate BMI.  GENERAL: vitals reviewed and listed above, alert, oriented, appears well hydrated and in no acute distress  HEENT: atraumatic, conjunttiva clear, no obvious abnormalities on inspection of external nose and ears, normal appearance of ear canals and TMs, clear nasal congestion, mild post oropharyngeal erythema with PND, no tonsillar edema or exudate, no sinus TTP  NECK: no obvious masses on inspection  LUNGS: clear to auscultation  bilaterally, no wheezes, rales or rhonchi, good air movement  CV: HRRR, no peripheral edema  MS: moves all extremities without noticeable abnormality  PSYCH: pleasant and cooperative, no obvious depression or anxiety  ASSESSMENT AND PLAN:  Discussed the following assessment and plan:  Acute upper respiratory infection  -given HPI and exam findings today, a serious infection or illness is unlikely. We discussed potential etiologies, with VURI being most likely, and advised supportive care and monitoring. We discussed treatment side effects and interactions especially with the cough syrup, likely course, antibiotic misuse, transmission, and signs of developing a serious illness. -of course, we advised to return or notify a doctor immediately if symptoms worsen or persist or new concerns arise.    Patient Instructions  INSTRUCTIONS FOR UPPER RESPIRATORY INFECTION:  -nasal saline wash 2-3 times daily (use prepackaged nasal saline or bottled/distilled water if making your own)   -can use AFRIN nasal spray for drainage and nasal congestion - but do NOT use longer then 3-4 days  -can use tylenol (in no history of liver disease) or ibuprofen (if no history of kidney disease, bowel bleeding or significant heart disease) as directed for aches and sorethroat  -if you are taking a cough medication - use only as directed, may also try a teaspoon of honey to coat the throat and throat lozenges. If given a cough  medication with codeine or hydrocodone or other narcotic please be advised that this contains a strong and  potentially addicting medication. Please follow instructions carefully, take as little as possible and only use AS NEEDED for severe cough. Discuss potential side effects with your pharmacy. Please do not drive or operate machinery while taking these types of medications. Please do not take other sedating medications, drugs or alcohol while taking this medication without discussing with your doctor.  -for sore throat, salt water gargles can help  -follow up if you have fevers, facial pain, tooth pain, difficulty breathing or are worsening or symptoms persist longer then expected  Upper Respiratory Infection, Adult An upper respiratory infection (URI) is also known as the common cold. It is often caused by a type of germ (virus). Colds are easily spread (contagious). You can pass it to others by kissing, coughing, sneezing, or drinking out of the same glass. Usually, you get better in 1 to 3  weeks.  However, the cough can last for even longer. HOME CARE   Only take medicine as told by your doctor. Follow instructions provided above.  Drink enough water and fluids to keep your pee (urine) clear or pale yellow.  Get plenty of rest.  Return to work when your temperature is < 100 for 24 hours or as told by your doctor. You may use a face mask and wash your hands to stop your cold from spreading. GET HELP RIGHT AWAY IF:   After the first few days, you feel you are getting worse.  You have questions about your medicine.  You have chills, shortness of breath, or red spit (mucus).  You have pain in the face for more then 1-2 days, especially when you bend forward.  You have a fever, puffy (swollen) neck, pain when you swallow, or white spots in the back of your throat.  You have a bad headache, ear pain, sinus pain, or chest pain.  You have a high-pitched whistling sound when you breathe  in and out (wheezing).  You cough up blood.  You have sore muscles or a stiff neck. MAKE SURE YOU:  Understand these instructions.  Will watch your condition.  Will get help right away if you are not doing well or get worse. Document Released: 04/21/2008 Document Revised: 01/26/2012 Document Reviewed: 02/08/2014 [facility] Patient Information 2015 [place], [place]. This information is not intended to replace advice given to you by your health care provider. Make sure you discuss any questions you have with your health care provider.      Colin Benton R.

## 2016-04-18 ENCOUNTER — Other Ambulatory Visit: Payer: Self-pay

## 2016-04-18 MED ORDER — DICYCLOMINE HCL 10 MG PO CAPS: 10.0000 mg | ORAL_CAPSULE | Freq: Three times a day (TID) | ORAL | Status: DC

## 2016-04-21 ENCOUNTER — Encounter: Payer: Self-pay | Admitting: Family Medicine

## 2016-04-21 ENCOUNTER — Ambulatory Visit (INDEPENDENT_AMBULATORY_CARE_PROVIDER_SITE_OTHER): Payer: BLUE CROSS/BLUE SHIELD | Admitting: Family Medicine

## 2016-04-21 VITALS — BP 118/78 | HR 96 | Temp 99.0°F | Ht 65.5 in | Wt 206.0 lb

## 2016-04-21 DIAGNOSIS — J019 Acute sinusitis, unspecified: Secondary | ICD-10-CM | POA: Diagnosis not present

## 2016-04-21 MED ORDER — HYDROCOD POLST-CPM POLST ER 10-8 MG/5ML PO SUER: 5.0000 mL | Freq: Two times a day (BID) | ORAL | Status: DC | PRN

## 2016-04-21 MED ORDER — LEVOFLOXACIN 500 MG PO TABS: 500.0000 mg | ORAL_TABLET | Freq: Every day | ORAL | Status: AC

## 2016-04-21 MED ORDER — METHYLPREDNISOLONE ACETATE 80 MG/ML IJ SUSP
120.0000 mg | Freq: Once | INTRAMUSCULAR | Status: AC
  Administered 2016-04-21: 120 mg via INTRAMUSCULAR

## 2016-04-21 NOTE — Progress Notes (Signed)
   Subjective:    Patient ID: Danna Heftyshley Slone, female    DOB: 07/05/1981, 35 y.o.   MRN: [id number]  HPI Here for 10 days of sinus pressure, ear pressure, blowing green mucus from the nose, ST, and a dry cough. She was seen here last week when it looked like a viral URI. She has been drinking fluids and using Afrin and Mucinex with poor results.   Review of Systems  Constitutional: Negative.   HENT: Positive for congestion, postnasal drip, sinus pressure and sore throat.   Eyes: Negative.   Respiratory: Positive for cough.        Objective:   Physical Exam  Constitutional: She appears well-developed and well-nourished.  HENT:  Right Ear: External ear normal.  Left Ear: External ear normal.  Nose: Nose normal.  posterior OP is red without exudate   Eyes: Conjunctivae are normal.  Neck: No thyromegaly present.  Pulmonary/Chest: Effort normal and breath sounds normal.  Lymphadenopathy:    She has no cervical adenopathy.          Assessment & Plan:  Sinusitis, treat with a steroid shot and Levaquin.  Nelwyn SalisburyFRY,Kathren Scearce A, MD

## 2016-04-21 NOTE — Addendum Note (Signed)
Addended by: Marchia MeiersEASTWOOD, Mychaela Lennartz M on: 04/21/2016 05:30 PM   Modules accepted: Orders

## 2016-04-21 NOTE — Progress Notes (Signed)
Pre visit review using our clinic review tool, if applicable. No additional management support is needed unless otherwise documented below in the visit note. 

## 2016-05-01 ENCOUNTER — Telehealth: Payer: Self-pay | Admitting: Family Medicine

## 2016-05-01 NOTE — Telephone Encounter (Signed)
Pt states the LORazepam (ATIVAN) 1 MG tablet Is not working. Pt still having the clicking and anxiety. Would like to know if you have any other suggestions or another med.  if today 6/15,  the pharmacy  [facility] [place] If tomorrow, 6/16  [facility]

## 2016-05-02 MED ORDER — DIAZEPAM 5 MG PO TABS: 5.0000 mg | ORAL_TABLET | Freq: Two times a day (BID) | ORAL | Status: DC | PRN

## 2016-05-02 NOTE — Telephone Encounter (Signed)
Cancel the Lorazepam, instead call in Valium 5 mg to take bid prn anxiety, #60 with 2 rf

## 2016-05-02 NOTE — Telephone Encounter (Signed)
I cancelled the script for Lorazepam, removed from current list, called in [facility] and spoke with pt.

## 2016-05-21 ENCOUNTER — Telehealth: Payer: Self-pay | Admitting: Family Medicine

## 2016-05-21 MED ORDER — HYDROCOD POLST-CPM POLST ER 10-8 MG/5ML PO SUER: 5.0000 mL | Freq: Two times a day (BID) | ORAL | Status: DC | PRN

## 2016-05-21 NOTE — Telephone Encounter (Signed)
Pt was seen a month ago and still having the same symptom and would like a refill on tussionex.[facility].

## 2016-05-21 NOTE — Telephone Encounter (Signed)
Script is ready for pick up and I spoke with pt.  

## 2016-05-21 NOTE — Telephone Encounter (Signed)
done

## 2016-06-10 ENCOUNTER — Ambulatory Visit (INDEPENDENT_AMBULATORY_CARE_PROVIDER_SITE_OTHER): Payer: Self-pay | Admitting: Family Medicine

## 2016-06-10 VITALS — BP 105/76 | HR 113 | Temp 99.0°F | Ht 65.5 in | Wt 209.0 lb

## 2016-06-10 DIAGNOSIS — J209 Acute bronchitis, unspecified: Secondary | ICD-10-CM

## 2016-06-10 MED ORDER — METHYLPREDNISOLONE ACETATE 80 MG/ML IJ SUSP
160.0000 mg | Freq: Once | INTRAMUSCULAR | Status: AC
  Administered 2016-06-10: 160 mg via INTRAMUSCULAR

## 2016-06-10 MED ORDER — FLUCONAZOLE 150 MG PO TABS: 150.0000 mg | ORAL_TABLET | Freq: Once | ORAL | 5 refills | Status: AC

## 2016-06-10 MED ORDER — HYDROCOD POLST-CPM POLST ER 10-8 MG/5ML PO SUER: 5.0000 mL | Freq: Two times a day (BID) | ORAL | 0 refills | Status: DC | PRN

## 2016-06-10 MED ORDER — ALBUTEROL SULFATE HFA 108 (90 BASE) MCG/ACT IN AERS: 2.0000 | INHALATION_SPRAY | RESPIRATORY_TRACT | 2 refills | Status: DC | PRN

## 2016-06-10 MED ORDER — LEVOFLOXACIN 500 MG PO TABS: 500.0000 mg | ORAL_TABLET | Freq: Every day | ORAL | 0 refills | Status: AC

## 2016-06-10 MED ORDER — TRAMADOL HCL 50 MG PO TABS: 50.0000 mg | ORAL_TABLET | Freq: Four times a day (QID) | ORAL | 2 refills | Status: DC | PRN

## 2016-06-10 NOTE — Progress Notes (Signed)
Pre visit review using our clinic review tool, if applicable. No additional management support is needed unless otherwise documented below in the visit note. 

## 2016-06-10 NOTE — Progress Notes (Signed)
   Subjective:    Patient ID: Diane [name], female    DOB: 03/06/1981, 35 y.o.   MRN: [id number]  HPI Here for 5 days of fever to 101 degrees, chest tightness, wheezing, and coughing up green sputum. Her entire family is sick after they returned from a beach trip.    Review of Systems  Constitutional: Positive for chills and fever.  HENT: Positive for congestion and sore throat. Negative for ear pain, postnasal drip and sinus pressure.   Eyes: Negative.   Respiratory: Positive for cough, chest tightness, shortness of breath and wheezing.   Cardiovascular: Negative.        Objective:   Physical Exam  Constitutional:  Appears ill, coughing  HENT:  Right Ear: External ear normal.  Left Ear: External ear normal.  Nose: Nose normal.  Mouth/Throat: Oropharynx is clear and moist.  Eyes: Conjunctivae are normal.  Neck: Neck supple. No thyromegaly present.  Cardiovascular: Normal rate, regular rhythm, normal heart sounds and intact distal pulses.   Pulmonary/Chest: Effort normal. She has no rales.  Scattered rhonchi and wheezing  Lymphadenopathy:    She has no cervical adenopathy.          Assessment & Plan:  Bronchitis, treat with a steroid shot, Levaquin, and an inhaler.  Nelwyn Salisbury, MD

## 2016-06-10 NOTE — Addendum Note (Signed)
Addended by: Aniceto Boss A on: 06/10/2016 12:19 PM   Modules accepted: Orders

## 2016-06-13 ENCOUNTER — Telehealth: Payer: Self-pay | Admitting: Family Medicine

## 2016-06-13 NOTE — Telephone Encounter (Signed)
Pt states the abx does not seem to be working. Pt wants to know if she needs a stronger one? Pt states she feels just as bad as she did the other day.   [facility]

## 2016-06-13 NOTE — Telephone Encounter (Signed)
I spoke with pt  

## 2016-06-13 NOTE — Telephone Encounter (Signed)
The antibiotic she is taking is the strongest there is, but she is only on day 3. She should stick with it and give it more time

## 2016-06-19 ENCOUNTER — Other Ambulatory Visit: Payer: Self-pay | Admitting: Orthopedic Surgery

## 2016-06-23 ENCOUNTER — Other Ambulatory Visit: Payer: Self-pay | Admitting: Orthopedic Surgery

## 2016-06-23 NOTE — Pre-Procedure Instructions (Signed)
Diane [name]  06/23/2016      [facility] [place] - [address] [place] [address] Phone: [phone] Fax: [phone]  [id number] [place], [place] - [address] [place] [address] Phone: [phone] Fax: [phone]    Your procedure is scheduled on August 15th, Tuesday   [facility] Admitting at 12:30 pm             (posted surgery time 2:30 pm to 3:45 pm)   Call this number if you have problems the morning of surgery:  [phone]   Remember:  Do not eat food or drink liquids after midnight Monday.   Take these medicines the morning of surgery with A SIP OF WATER : Valium, Gabapentin, Ditropan, Zantac, Tramadol, Effexor, use inhaler if needed    Do not wear jewelry, make-up or nail polish.  Do not wear lotions, powders, or perfumes.     Do not shave underarms 48 hours prior to surgery.    Do not bring valuables to the hospital.  [facility] is not responsible for any belongings or valuables.  Contacts, dentures or bridgework may not be worn into surgery.  Leave your suitcase in the car.  After surgery it may be brought to your room.  For patients admitted to the hospital, discharge time will be determined by your treatment team.  Name and phone number of your driver:     Please read over the following fact sheets that you were given. MRSA Information and Surgical Site Infection Prevention

## 2016-06-24 ENCOUNTER — Encounter (HOSPITAL_COMMUNITY)
Admission: RE | Admit: 2016-06-24 | Discharge: 2016-06-24 | Disposition: A | Discharge status: Home / Self Care | Payer: BLUE CROSS/BLUE SHIELD | Source: Ambulatory Visit | Attending: Orthopedic Surgery | Admitting: Orthopedic Surgery

## 2016-06-24 ENCOUNTER — Encounter (HOSPITAL_COMMUNITY): Payer: Self-pay

## 2016-06-24 DIAGNOSIS — M25571 Pain in right ankle and joints of right foot: Secondary | ICD-10-CM | POA: Insufficient documentation

## 2016-06-24 DIAGNOSIS — Z01812 Encounter for preprocedural laboratory examination: Secondary | ICD-10-CM | POA: Diagnosis present

## 2016-06-24 HISTORY — DX: Unspecified osteoarthritis, unspecified site: M19.90

## 2016-06-24 LAB — BASIC METABOLIC PANEL
Anion gap: 8 (ref 5–15)
BUN: 14 mg/dL (ref 6–20)
CALCIUM: 9.8 mg/dL (ref 8.9–10.3)
CHLORIDE: 102 mmol/L (ref 101–111)
CO2: 24 mmol/L (ref 22–32)
CREATININE: 0.7 mg/dL (ref 0.44–1.00)
GFR calc non Af Amer: >60 mL/min (ref >60)
GLUCOSE: 115 mg/dL — AB (ref 65–99)
Potassium: 3.8 mmol/L (ref 3.5–5.1)
Sodium: 134 mmol/L — ABNORMAL LOW (ref 135–145)

## 2016-06-24 LAB — CBC
HCT: 40.1 % (ref 36.0–46.0)
Hemoglobin: 13 g/dL (ref 12.0–15.0)
MCH: 29.5 pg (ref 26.0–34.0)
MCHC: 32.4 g/dL (ref 30.0–36.0)
MCV: 90.9 fL (ref 78.0–100.0)
PLATELETS: 300 10*3/uL (ref 150–400)
RBC: 4.41 MIL/uL (ref 3.87–5.11)
RDW: 13.6 % (ref 11.5–15.5)
WBC: 11.1 10*3/uL — ABNORMAL HIGH (ref 4.0–10.5)

## 2016-06-24 LAB — SURGICAL PCR SCREEN
MRSA, PCR: NEGATIVE
Staphylococcus aureus: NEGATIVE

## 2016-06-24 LAB — HCG, SERUM, QUALITATIVE: PREG SERUM: NEGATIVE

## 2016-06-24 NOTE — Progress Notes (Signed)
    Diane [name] Pt. Recently treated with PCP ( Dr. Clent RidgesFry) for URI, bronchitis with antibiotic, states she finished the entire bottle, denies lingering chest concerns relative to Bronchitis.  Followed with Neurology due to migraines & h/o pseudotumor. No cardiac history, denies chest concerns.

## 2016-06-27 ENCOUNTER — Telehealth: Payer: Self-pay | Admitting: Family Medicine

## 2016-06-27 NOTE — Telephone Encounter (Signed)
NO it is too soon. She got a large bottle just one week ago

## 2016-06-27 NOTE — Telephone Encounter (Signed)
Pt is still having a cough and would like another rx tussinex

## 2016-06-30 MED ORDER — CEFAZOLIN SODIUM-DEXTROSE 2-4 GM/100ML-% IV SOLN
2.0000 g | INTRAVENOUS | Status: AC
  Administered 2016-07-01: 2 g via INTRAVENOUS
  Filled 2016-06-30: qty 100

## 2016-06-30 NOTE — H&P (Signed)
Diane [name] is an 35 y.o. female.   Chief Complaint: Right ankle pain HPI: Diane [name] is a 35 year old patient with right ankle pain.  She underwent open reduction internal fixation of trimalleolar ankle fracture 6 months ago.  Her fractures of healed and her ankle has restored his range of motion still does have some pain.  However she does report a lot of pure neuritis and has been scratching the area significantly since surgery.  Plan at this time is for hardware removal due to possible metal allergy.  Past Medical History:  Diagnosis Date  . ADD (attention deficit disorder with hyperactivity)    no current med.  . Anemia   . Anxiety   . Arthritis    R knee   . 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 COPD (chronic obstructive pulmonary disease) (HCC)    bronchitis treated - mid July - 2017, has finished antibiotic   . Depression   . Family history of adverse reaction to anesthesia    pt's father has hx. of being hard to wake up post-op  . GERD (gastroesophageal reflux disease)   . History of gastric ulcer   . History of kidney stones   . History of kidney stones   . IBS (irritable bowel syndrome)   . Migraine    sees Darrow Bussinghristine Hagan MD , Migraines   . 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 PONV (postoperative nausea and vomiting)   . Shortness of breath dyspnea   . Wears partial dentures    upper    Past Surgical History:  Procedure Laterality Date  . APPENDECTOMY    . BLADDER SURGERY     x 2 - as a child to stretch bladder  . CESAREAN SECTION  12/24/2009   x2  . CESAREAN SECTION  11/20/2011   Procedure: CESAREAN SECTION;  Surgeon: Lenoard Adenichard J Taavon, MD;  Location: [facility];  Service: Gynecology;  Laterality: N/A;  . CHROMOPERTUBATION  03/05/2007  . CYSTOSCOPY WITH RETROGRADE PYELOGRAM, URETEROSCOPY AND STENT PLACEMENT Left 08/10/2006  . HYSTEROSCOPY W/ ENDOMETRIAL ABLATION  03/05/2007  . KNEE ARTHROSCOPY WITH LATERAL RELEASE Right 12/13/2014   Procedure: KNEE ARTHROSCOPY  WITH LATERAL RELEASE;  Surgeon: Thera FlakeW D Caffrey Jr., MD;  Location: [facility];  Service: Orthopedics;  Laterality: Right;  . LAPAROSCOPIC APPENDECTOMY N/A 06/11/2014   Procedure: APPENDECTOMY LAPAROSCOPIC;  Surgeon: Velora Hecklerodd M Gerkin, MD;  Location: [facility];  Service: General;  Laterality: N/A;  . ORIF ANKLE FRACTURE Right 01/05/2016   Procedure: OPEN REDUCTION INTERNAL FIXATION (ORIF) ANKLE FRACTURE;  Surgeon: Cammy CopaScott Brentney Goldbach, MD;  Location: [facility];  Service: Orthopedics;  Laterality: Right;  . OVARIAN CYST SURGERY Right 03/05/2007  . 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 SYNOVECTOMY Right 12/13/2014   Procedure: PLICA SYNOVECTOMY;  Surgeon: Thera FlakeW D Caffrey Jr., MD;  Location: [facility];  Service: Orthopedics;  Laterality: Right;  . TONSILLECTOMY AND ADENOIDECTOMY    . WISDOM TOOTH EXTRACTION      Family History  Problem Relation Age of Onset  . Brain cancer Father   . Anesthesia problems Father     hard to wake up post-op  . Lung cancer Father   . Breast cancer Maternal Grandmother   . Ovarian cancer Maternal Grandmother   . Diabetes Maternal Grandmother   . Heart disease Maternal Grandmother   . Irritable bowel syndrome Mother   . Prostate cancer Maternal Grandfather   .  Melanoma Maternal Grandfather   . Colon cancer Neg Hx   . Esophageal cancer Neg Hx   . Stomach cancer Neg Hx   . Rectal cancer Neg Hx    Social History:  reports that she has been smoking Cigarettes.  She has a 2.50 pack-year smoking history. She has never used smokeless tobacco. She reports that she drinks alcohol. She reports that she does not use drugs.  Allergies:  Allergies  Allergen Reactions  . Nsaids Other (See Comments)    DUE TO HISTORY OF GASTRIC ULCER  . Latex Itching and Rash    Sounds like adhesive sensitivity   . Metoclopramide Hcl Other (See Comments)    RESTLESSNESS/JITTERY  . Prochlorperazine Edisylate Other (See Comments)    RESTLESSNESS/JITTERY      No prescriptions prior to admission.    No results found for this or any previous visit (from the past 48 hour(s)). No results found.  Review of Systems  Musculoskeletal: Positive for joint pain.  All other systems reviewed and are negative.   Last menstrual period 05/26/2016. Physical Exam  Constitutional: She appears well-developed.  HENT:  Head: Normocephalic.  Eyes: Pupils are equal, round, and reactive to light.  Neck: Normal range of motion.  Cardiovascular: Normal rate.   Respiratory: Effort normal.  Neurological: She is alert.  Skin: Skin is warm.  Psychiatric: She has a normal mood and affect.   examination the right foot demonstrates pretty reasonable ankle range of motion well-healed surgical incisions medially and laterally.  She has intact sensation dorsal plantar aspect of the foot does have evidence where she has been scratching the skin around the ankle region there is no erythema or warmth or crepitus.  Assessment/Plan Impression is healed right ankle fracture with possible allergic reaction to the metal.  Plan at this time is for hardware removal.  Was a question several months ago about the healing of the posterior malleolar fragment however this fragment does appear to be healed.  Plan on hardware removal.  Incumbent risks discussed including not limited to infection or vessel damage potential for refracture.  All questions answered  Cammy CopaEAN,Molly Savarino SCOTT, MD 06/30/2016, 6:27 PM

## 2016-06-30 NOTE — Telephone Encounter (Signed)
I spoke with pt and gave below message.  

## 2016-07-01 ENCOUNTER — Ambulatory Visit (HOSPITAL_COMMUNITY)
Admission: RE | Admit: 2016-07-01 | Discharge: 2016-07-01 | Disposition: A | Discharge status: Home / Self Care | Payer: BLUE CROSS/BLUE SHIELD | Source: Ambulatory Visit | Attending: Orthopedic Surgery | Admitting: Orthopedic Surgery

## 2016-07-01 ENCOUNTER — Ambulatory Visit (HOSPITAL_COMMUNITY): Payer: BLUE CROSS/BLUE SHIELD | Admitting: Certified Registered Nurse Anesthetist

## 2016-07-01 ENCOUNTER — Encounter (HOSPITAL_COMMUNITY): Payer: Self-pay

## 2016-07-01 ENCOUNTER — Encounter (HOSPITAL_COMMUNITY)
Admission: RE | Disposition: A | Discharge status: Home / Self Care | Payer: Self-pay | Source: Ambulatory Visit | Attending: Orthopedic Surgery

## 2016-07-01 DIAGNOSIS — Z888 Allergy status to other drugs, medicaments and biological substances status: Secondary | ICD-10-CM | POA: Insufficient documentation

## 2016-07-01 DIAGNOSIS — T8489XA Other specified complication of internal orthopedic prosthetic devices, implants and grafts, initial encounter: Secondary | ICD-10-CM | POA: Diagnosis present

## 2016-07-01 DIAGNOSIS — K219 Gastro-esophageal reflux disease without esophagitis: Secondary | ICD-10-CM | POA: Insufficient documentation

## 2016-07-01 DIAGNOSIS — F909 Attention-deficit hyperactivity disorder, unspecified type: Secondary | ICD-10-CM | POA: Insufficient documentation

## 2016-07-01 DIAGNOSIS — M1990 Unspecified osteoarthritis, unspecified site: Secondary | ICD-10-CM | POA: Insufficient documentation

## 2016-07-01 DIAGNOSIS — F17200 Nicotine dependence, unspecified, uncomplicated: Secondary | ICD-10-CM | POA: Diagnosis not present

## 2016-07-01 DIAGNOSIS — J449 Chronic obstructive pulmonary disease, unspecified: Secondary | ICD-10-CM | POA: Diagnosis not present

## 2016-07-01 DIAGNOSIS — Z886 Allergy status to analgesic agent status: Secondary | ICD-10-CM | POA: Insufficient documentation

## 2016-07-01 DIAGNOSIS — Z91040 Latex allergy status: Secondary | ICD-10-CM | POA: Insufficient documentation

## 2016-07-01 DIAGNOSIS — Y831 Surgical operation with implant of artificial internal device as the cause of abnormal reaction of the patient, or of later complication, without mention of misadventure at the time of the procedure: Secondary | ICD-10-CM | POA: Diagnosis not present

## 2016-07-01 DIAGNOSIS — Z6832 Body mass index (BMI) 32.0-32.9, adult: Secondary | ICD-10-CM | POA: Diagnosis not present

## 2016-07-01 DIAGNOSIS — F419 Anxiety disorder, unspecified: Secondary | ICD-10-CM | POA: Diagnosis not present

## 2016-07-01 DIAGNOSIS — K589 Irritable bowel syndrome without diarrhea: Secondary | ICD-10-CM | POA: Diagnosis not present

## 2016-07-01 DIAGNOSIS — F329 Major depressive disorder, single episode, unspecified: Secondary | ICD-10-CM | POA: Diagnosis not present

## 2016-07-01 DIAGNOSIS — F17210 Nicotine dependence, cigarettes, uncomplicated: Secondary | ICD-10-CM | POA: Diagnosis not present

## 2016-07-01 DIAGNOSIS — Z87442 Personal history of urinary calculi: Secondary | ICD-10-CM | POA: Insufficient documentation

## 2016-07-01 DIAGNOSIS — S82891G Other fracture of right lower leg, subsequent encounter for closed fracture with delayed healing: Secondary | ICD-10-CM

## 2016-07-01 HISTORY — PX: HARDWARE REMOVAL: SHX979

## 2016-07-01 SURGERY — REMOVAL, HARDWARE
Anesthesia: Regional | Site: Ankle | Laterality: Right

## 2016-07-01 MED ORDER — ROPIVACAINE HCL 7.5 MG/ML IJ SOLN
INTRAMUSCULAR | Status: DC | PRN
  Administered 2016-07-01: 15 mL via PERINEURAL

## 2016-07-01 MED ORDER — BUPIVACAINE HCL (PF) 0.25 % IJ SOLN
INTRAMUSCULAR | Status: DC | PRN
  Administered 2016-07-01: 8 mL via INTRA_ARTICULAR

## 2016-07-01 MED ORDER — ACETAMINOPHEN 160 MG/5ML PO SOLN
325.0000 mg | ORAL | Status: DC | PRN
  Filled 2016-07-01: qty 20.3

## 2016-07-01 MED ORDER — ONDANSETRON HCL 4 MG/2ML IJ SOLN
INTRAMUSCULAR | Status: DC | PRN
  Administered 2016-07-01: 4 mg via INTRAVENOUS

## 2016-07-01 MED ORDER — BUPIVACAINE-EPINEPHRINE (PF) 0.5% -1:200000 IJ SOLN
INTRAMUSCULAR | Status: DC | PRN
  Administered 2016-07-01: 30 mL via PERINEURAL

## 2016-07-01 MED ORDER — METHYLPREDNISOLONE ACETATE 40 MG/ML IJ SUSP
INTRAMUSCULAR | Status: AC
  Filled 2016-07-01: qty 1

## 2016-07-01 MED ORDER — SUCCINYLCHOLINE CHLORIDE 200 MG/10ML IV SOSY
PREFILLED_SYRINGE | INTRAVENOUS | Status: AC
Start: 2016-07-01 — End: 2016-07-01
  Filled 2016-07-01: qty 10

## 2016-07-01 MED ORDER — BUPIVACAINE HCL (PF) 0.25 % IJ SOLN
INTRAMUSCULAR | Status: AC
  Filled 2016-07-01: qty 30

## 2016-07-01 MED ORDER — OXYCODONE-ACETAMINOPHEN 5-325 MG PO TABS: 1.0000 | ORAL_TABLET | Freq: Four times a day (QID) | ORAL | 0 refills | Status: DC | PRN

## 2016-07-01 MED ORDER — SCOPOLAMINE 1 MG/3DAYS TD PT72
MEDICATED_PATCH | TRANSDERMAL | Status: AC
  Filled 2016-07-01: qty 1

## 2016-07-01 MED ORDER — CHLORHEXIDINE GLUCONATE 4 % EX LIQD: 60.0000 mL | Freq: Once | CUTANEOUS | Status: DC

## 2016-07-01 MED ORDER — FENTANYL CITRATE (PF) 250 MCG/5ML IJ SOLN
INTRAMUSCULAR | Status: AC
  Filled 2016-07-01: qty 5

## 2016-07-01 MED ORDER — LIDOCAINE HCL (CARDIAC) 20 MG/ML IV SOLN
INTRAVENOUS | Status: DC | PRN
  Administered 2016-07-01: 60 mg via INTRATRACHEAL

## 2016-07-01 MED ORDER — LACTATED RINGERS IV SOLN: INTRAVENOUS | Status: DC

## 2016-07-01 MED ORDER — FENTANYL CITRATE (PF) 100 MCG/2ML IJ SOLN
INTRAMUSCULAR | Status: AC
  Administered 2016-07-01: 100 ug
  Filled 2016-07-01: qty 4

## 2016-07-01 MED ORDER — 0.9 % SODIUM CHLORIDE (POUR BTL) OPTIME
TOPICAL | Status: DC | PRN
  Administered 2016-07-01: 1000 mL

## 2016-07-01 MED ORDER — MIDAZOLAM HCL 2 MG/2ML IJ SOLN
INTRAMUSCULAR | Status: AC
  Administered 2016-07-01: 2 mg
  Filled 2016-07-01: qty 2

## 2016-07-01 MED ORDER — PHENYLEPHRINE 40 MCG/ML (10ML) SYRINGE FOR IV PUSH (FOR BLOOD PRESSURE SUPPORT)
PREFILLED_SYRINGE | INTRAVENOUS | Status: AC
  Filled 2016-07-01: qty 10

## 2016-07-01 MED ORDER — PROPOFOL 10 MG/ML IV BOLUS
INTRAVENOUS | Status: AC
  Filled 2016-07-01: qty 20

## 2016-07-01 MED ORDER — HYDROMORPHONE HCL 1 MG/ML IJ SOLN: 0.2500 mg | INTRAMUSCULAR | Status: DC | PRN

## 2016-07-01 MED ORDER — PROPOFOL 1000 MG/100ML IV EMUL
INTRAVENOUS | Status: AC
  Filled 2016-07-01: qty 100

## 2016-07-01 MED ORDER — SCOPOLAMINE 1 MG/3DAYS TD PT72
MEDICATED_PATCH | TRANSDERMAL | Status: DC | PRN
  Administered 2016-07-01: 1.5 mg via TRANSDERMAL

## 2016-07-01 MED ORDER — MIDAZOLAM HCL 2 MG/2ML IJ SOLN
INTRAMUSCULAR | Status: AC
  Filled 2016-07-01: qty 2

## 2016-07-01 MED ORDER — OXYCODONE HCL 5 MG PO TABS
5.0000 mg | ORAL_TABLET | Freq: Once | ORAL | Status: AC | PRN
  Administered 2016-07-01: 5 mg via ORAL

## 2016-07-01 MED ORDER — CLONIDINE HCL (ANALGESIA) 100 MCG/ML EP SOLN
EPIDURAL | Status: DC | PRN
  Administered 2016-07-01: 1 mL via INTRA_ARTICULAR

## 2016-07-01 MED ORDER — OXYCODONE HCL 5 MG PO TABS
ORAL_TABLET | ORAL | Status: DC
Start: 2016-07-01 — End: 2016-07-01
  Filled 2016-07-01: qty 1

## 2016-07-01 MED ORDER — LACTATED RINGERS IV SOLN
INTRAVENOUS | Status: DC
  Administered 2016-07-01 (×3): via INTRAVENOUS

## 2016-07-01 MED ORDER — PROPOFOL 10 MG/ML IV BOLUS
INTRAVENOUS | Status: DC | PRN
  Administered 2016-07-01: 200 mg via INTRAVENOUS

## 2016-07-01 MED ORDER — PROPOFOL 500 MG/50ML IV EMUL
INTRAVENOUS | Status: DC | PRN
  Administered 2016-07-01: 100 ug/kg/min via INTRAVENOUS

## 2016-07-01 MED ORDER — OXYCODONE HCL 5 MG/5ML PO SOLN: 5.0000 mg | Freq: Once | ORAL | Status: AC | PRN

## 2016-07-01 MED ORDER — METHYLPREDNISOLONE ACETATE 40 MG/ML IJ SUSP
INTRAMUSCULAR | Status: DC | PRN
  Administered 2016-07-01: 40 mg via INTRA_ARTICULAR

## 2016-07-01 MED ORDER — LIDOCAINE 2% (20 MG/ML) 5 ML SYRINGE
INTRAMUSCULAR | Status: AC
  Filled 2016-07-01: qty 10

## 2016-07-01 MED ORDER — ACETAMINOPHEN 325 MG PO TABS: 325.0000 mg | ORAL_TABLET | ORAL | Status: DC | PRN

## 2016-07-01 MED ORDER — ROCURONIUM BROMIDE 10 MG/ML (PF) SYRINGE
PREFILLED_SYRINGE | INTRAVENOUS | Status: AC
  Filled 2016-07-01: qty 10

## 2016-07-01 SURGICAL SUPPLY — 55 items
BANDAGE ACE 6X5 VEL STRL LF (GAUZE/BANDAGES/DRESSINGS) ×1 IMPLANT
BANDAGE ELASTIC 4 VELCRO ST LF (GAUZE/BANDAGES/DRESSINGS) IMPLANT
BANDAGE ELASTIC 6 VELCRO ST LF (GAUZE/BANDAGES/DRESSINGS) IMPLANT
BANDAGE ESMARK 6X9 LF (GAUZE/BANDAGES/DRESSINGS) IMPLANT
BLADE SURG 15 STRL LF DISP TIS (BLADE) IMPLANT
BLADE SURG 15 STRL SS (BLADE) ×2
BNDG CMPR 9X4 STRL LF SNTH (GAUZE/BANDAGES/DRESSINGS) ×1
BNDG CMPR 9X6 STRL LF SNTH (GAUZE/BANDAGES/DRESSINGS) ×1
BNDG COHESIVE 4X5 TAN STRL (GAUZE/BANDAGES/DRESSINGS) IMPLANT
BNDG ESMARK 4X9 LF (GAUZE/BANDAGES/DRESSINGS) ×1 IMPLANT
BNDG ESMARK 6X9 LF (GAUZE/BANDAGES/DRESSINGS) ×2
BNDG GAUZE ELAST 4 BULKY (GAUZE/BANDAGES/DRESSINGS) ×2 IMPLANT
COVER SURGICAL LIGHT HANDLE (MISCELLANEOUS) ×2 IMPLANT
CUFF TOURNIQUET SINGLE 34IN LL (TOURNIQUET CUFF) IMPLANT
CUFF TOURNIQUET SINGLE 44IN (TOURNIQUET CUFF) IMPLANT
DRAPE C-ARM 42X72 X-RAY (DRAPES) IMPLANT
DRAPE C-ARM MINI 42X72 WSTRAPS (DRAPES) ×2 IMPLANT
DRAPE EXTREMITY T 121X128X90 (DRAPE) IMPLANT
DRAPE INCISE IOBAN 66X45 STRL (DRAPES) IMPLANT
DRAPE ORTHO SPLIT 77X108 STRL (DRAPES) ×4
DRAPE PROXIMA HALF (DRAPES) IMPLANT
DRAPE SURG ORHT 6 SPLT 77X108 (DRAPES) IMPLANT
DRSG EMULSION OIL 3X3 NADH (GAUZE/BANDAGES/DRESSINGS) ×2 IMPLANT
DRSG PAD ABDOMINAL 8X10 ST (GAUZE/BANDAGES/DRESSINGS) ×2 IMPLANT
DRSG TEGADERM 4X4.75 (GAUZE/BANDAGES/DRESSINGS) ×1 IMPLANT
ELECT REM PT RETURN 9FT ADLT (ELECTROSURGICAL) ×2
ELECTRODE REM PT RTRN 9FT ADLT (ELECTROSURGICAL) ×1 IMPLANT
GAUZE SPONGE 4X4 12PLY STRL (GAUZE/BANDAGES/DRESSINGS) ×2 IMPLANT
GAUZE XEROFORM 1X8 LF (GAUZE/BANDAGES/DRESSINGS) ×2 IMPLANT
GLOVE BIOGEL PI IND STRL 8 (GLOVE) ×1 IMPLANT
GLOVE BIOGEL PI INDICATOR 8 (GLOVE) ×1
GLOVE SURG ORTHO 8.0 STRL STRW (GLOVE) ×2 IMPLANT
GOWN STRL REUS W/ TWL LRG LVL3 (GOWN DISPOSABLE) ×3 IMPLANT
GOWN STRL REUS W/TWL LRG LVL3 (GOWN DISPOSABLE) ×6
KIT BASIN OR (CUSTOM PROCEDURE TRAY) ×2 IMPLANT
KIT ROOM TURNOVER OR (KITS) ×2 IMPLANT
MANIFOLD NEPTUNE II (INSTRUMENTS) ×2 IMPLANT
NS IRRIG 1000ML POUR BTL (IV SOLUTION) ×2 IMPLANT
PACK GENERAL/GYN (CUSTOM PROCEDURE TRAY) ×2 IMPLANT
PAD ARMBOARD 7.5X6 YLW CONV (MISCELLANEOUS) ×4 IMPLANT
PAD CAST 4YDX4 CTTN HI CHSV (CAST SUPPLIES) ×2 IMPLANT
PADDING CAST COTTON 4X4 STRL (CAST SUPPLIES) ×2
PADDING CAST COTTON 6X4 STRL (CAST SUPPLIES) ×3 IMPLANT
STAPLER VISISTAT 35W (STAPLE) ×2 IMPLANT
STOCKINETTE IMPERVIOUS 9X36 MD (GAUZE/BANDAGES/DRESSINGS) IMPLANT
STOCKINETTE IMPERVIOUS LG (DRAPES) ×1 IMPLANT
SUT ETHILON 3 0 PS 1 (SUTURE) ×3 IMPLANT
SUT ETHILON 4 0 FS 1 (SUTURE) IMPLANT
SUT VIC AB 0 CT1 27 (SUTURE)
SUT VIC AB 0 CT1 27XBRD ANBCTR (SUTURE) IMPLANT
SUT VIC AB 2-0 CT1 27 (SUTURE) ×4
SUT VIC AB 2-0 CT1 TAPERPNT 27 (SUTURE) IMPLANT
TOWEL OR 17X24 6PK STRL BLUE (TOWEL DISPOSABLE) ×2 IMPLANT
TOWEL OR 17X26 10 PK STRL BLUE (TOWEL DISPOSABLE) ×2 IMPLANT
WATER STERILE IRR 1000ML POUR (IV SOLUTION) ×1 IMPLANT

## 2016-07-01 NOTE — Brief Op Note (Signed)
07/01/2016  3:54 PM  PATIENT:  Marin RobertsAshley S Audia  35 y.o. female  PRE-OPERATIVE DIAGNOSIS:  RIGHT ANKLE RETAINED HARDWARE patellofemoral chondromalacia  POST-OPERATIVE DIAGNOSIS: Symptomatic hardware right ankle and patellofemoral chondromalacia  PROCEDURE:  Procedure(s): HARDWARE REMOVAL right ankle and right knee injection  SURGEON:  Surgeon(s): Cammy CopaScott Gregory Dean, MD  ASSISTANT: Patrick Jupiterarla Bethune RNFA  ANESTHESIA:   regional and general  EBL: 15 ml    No intake/output data recorded.  BLOOD ADMINISTERED: none  DRAINS: none   LOCAL MEDICATIONS USED:  none  SPECIMEN:  No Specimen  COUNTS:  YES  TOURNIQUET:  * No tourniquets in log *  DICTATION: .Other Dictation: Dictation Number [id number]  PLAN OF CARE: Discharge to home after PACU  PATIENT DISPOSITION:  PACU - hemodynamically stable

## 2016-07-01 NOTE — Anesthesia Procedure Notes (Signed)
Anesthesia Regional Block:  Adductor canal block  Pre-Anesthetic Checklist: ,, timeout performed, Correct Patient, Correct Site, Correct Laterality, Correct Procedure, Correct Position, site marked, Risks and benefits discussed,  Surgical consent,  Pre-op evaluation,  At surgeon's request and post-op pain management  Laterality: Lower and Right  Prep: chloraprep       Needles:  Injection technique: Single-shot  Needle Type: Echogenic Stimulator Needle          Additional Needles:  Procedures: ultrasound guided (picture in chart) Adductor canal block Narrative:  Injection made incrementally with aspirations every 5 mL.  Performed by: Personally  Anesthesiologist: Kalla Watson  Additional Notes: H+P and labs reviewed, risks and benefits discussed with patient, procedure tolerated well without complications      

## 2016-07-01 NOTE — Anesthesia Procedure Notes (Signed)
Procedure Name: LMA Insertion Date/Time: 07/01/2016 2:42 PM Performed by: Tillman AbideHAWKINS, Hayleen Clinkscales B Pre-anesthesia Checklist: Patient identified, Emergency Drugs available, Suction available and Patient being monitored Patient Re-evaluated:Patient Re-evaluated prior to inductionOxygen Delivery Method: Circle System Utilized Preoxygenation: Pre-oxygenation with 100% oxygen Intubation Type: IV induction Ventilation: Mask ventilation without difficulty LMA: LMA inserted LMA Size: 4.0 Number of attempts: 1 Placement Confirmation: positive ETCO2 Tube secured with: Tape Dental Injury: Teeth and Oropharynx as per pre-operative assessment

## 2016-07-01 NOTE — Anesthesia Preprocedure Evaluation (Signed)
Anesthesia Evaluation  Patient identified by MRN, date of birth, ID band Patient awake    Reviewed: Allergy & Precautions, NPO status , Patient's Chart, lab work & pertinent test results  History of Anesthesia Complications (+) PONV and history of anesthetic complications  Airway Mallampati: II  TM Distance: >3 FB Neck ROM: Full    Dental  (+) Teeth Intact   Pulmonary shortness of breath, COPD, Current Smoker,    breath sounds clear to auscultation       Cardiovascular negative cardio ROS   Rhythm:Regular     Neuro/Psych  Headaches, PSYCHIATRIC DISORDERS Anxiety Depression    GI/Hepatic Neg liver ROS, PUD, GERD  Medicated and Controlled,  Endo/Other  Morbid obesity  Renal/GU negative Renal ROS     Musculoskeletal  (+) Arthritis ,   Abdominal   Peds  Hematology negative hematology ROS (+)   Anesthesia Other Findings   Reproductive/Obstetrics                             Anesthesia Physical Anesthesia Plan  ASA: II  Anesthesia Plan: General and Regional   Post-op Pain Management:    Induction: Intravenous  Airway Management Planned: LMA  Additional Equipment: None  Intra-op Plan:   Post-operative Plan: Extubation in OR  Informed Consent: I have reviewed the patients History and Physical, chart, labs and discussed the procedure including the risks, benefits and alternatives for the proposed anesthesia with the patient or authorized representative who has indicated his/her understanding and acceptance.   Dental advisory given  Plan Discussed with: CRNA and Surgeon  Anesthesia Plan Comments:         Anesthesia Quick Evaluation

## 2016-07-01 NOTE — Transfer of Care (Signed)
Immediate Anesthesia Transfer of Care Note  Patient: Diane Robertsshley S [name]  Procedure(s) Performed: Procedure(s): HARDWARE REMOVAL (Right)  Patient Location: PACU  Anesthesia Type:General  Level of Consciousness: awake, alert , oriented and patient cooperative  Airway & Oxygen Therapy: Patient Spontanous Breathing  Post-op Assessment: Report given to RN and Post -op Vital signs reviewed and stable  Post vital signs: Reviewed and stable  Last Vitals:  Vitals:   07/01/16 1251  BP: 140/79  Pulse: 99  Resp: 20  Temp: 36.9 C    Last Pain:  Vitals:   07/01/16 1251  TempSrc: Oral         Complications: No apparent anesthesia complications

## 2016-07-01 NOTE — Anesthesia Procedure Notes (Signed)
Anesthesia Regional Block:  Popliteal block  Pre-Anesthetic Checklist: ,, timeout performed, Correct Patient, Correct Site, Correct Laterality, Correct Procedure, Correct Position, site marked, Risks and benefits discussed,  Surgical consent,  Pre-op evaluation,  At surgeon's request and post-op pain management  Laterality: Lower and Right  Prep: chloraprep       Needles:  Injection technique: Single-shot  Needle Type: Echogenic Stimulator Needle          Additional Needles:  Procedures: ultrasound guided (picture in chart) and nerve stimulator Popliteal block  Nerve Stimulator or Paresthesia:  Response: plantar, 0.5 mA,   Additional Responses:   Narrative:  Injection made incrementally with aspirations every 5 mL.  Performed by: Personally  Anesthesiologist: Corsica Franson  Additional Notes: H+P and labs reviewed, risks and benefits discussed with patient, procedure tolerated well without complications      

## 2016-07-01 NOTE — Progress Notes (Signed)
Orthopedic Tech Progress Note Patient Details:  Diane [name] 02/17/1981 [id number]  Ortho Devices Type of Ortho Device: CAM walker Ortho Device/Splint Interventions: Freeman CaldronOrdered   Drystan Reader 07/01/2016, 3:43 PM

## 2016-07-01 NOTE — Progress Notes (Signed)
Orthopedic Tech Progress Note Patient Details:  Diane Robertsshley S [name] 11/20/1980 [id number]  Patient ID: Diane RobertsAshley S [name], female   DOB: 04/10/1981, 35 y.o.   MRN: [id number]   Nikki DomCrawford, Bransyn Adami 07/01/2016, 3:44 PM As ordered by Dr. August Saucerean

## 2016-07-01 NOTE — Op Note (Signed)
NAMMarsa [name]:  Vandunk, Crystina               ACCOUNT NO.:  [id number]  MEDICAL RECORD NO.:  [id number]  LOCATION:  [facility]                         FACILITY:  [facility]  PHYSICIAN:  Burnard BuntingG. Scott Joselynne Killam, M.D.    DATE OF BIRTH:  1981-06-20  DATE OF PROCEDURE: DATE OF DISCHARGE:                              OPERATIVE REPORT   PREOPERATIVE DIAGNOSIS:  Symptomatic retained hardware, right ankle and right knee chondromalacia.  POSTOPERATIVE DIAGNOSIS:  Symptomatic retained hardware, right ankle and right knee chondromalacia.  PROCEDURES: 1. Right ankle removal of hardware x2 with removal of lateral fibular     plate and two screws, medial malleolus. 2. Injection, right knee.  SURGEON:  Burnard BuntingG. Scott Jerrie Gullo, M.D.  ASSISTANT:  Patrick Jupiterarla Bethune, RNFA.  INDICATIONS:  [name] is a 35 year old patient, 6 months out from right ankle fracture fixation, developed potentially could be metal allergy with significant itching around the ankle site, presents now for hardware removal after fracture healing.  PROCEDURE IN DETAIL:  The patient was brought to the operating room where general anesthetic was induced.  Preoperative antibiotics were administered.  Time-out was called.  Right ankle, prescribed with alcohol and Betadine, allowed to air dry.  Prepped with DuraPrep solution and draped in a sterile manner.  Collier Flowersoban was used to cover the operative field.  Ankle Esmarch utilized, approximately 30 minutes incision made on the lateral aspect of the fibula.  Skin was subcutaneous tissue were sharply divided.  Screws were encountered. Screws were removed.  The plate was removed.  The fracture was healed, this included a lag screw.  Similar incision was made medially on the anterior aspect of the medial malleolus.  Skin was subcutaneous tissue were sharply divided, the 2.5-inch tissue divided and two malleolar screws were identified.  They were removed.  Ankle syndesmosis stable in the AP and lateral planes.  No evidence of popping  or soft tissue irritation was present.  Thorough irrigation was performed.  Then, the incisions were closed using 2-0 Vicryl and 2-0 nylon.  Knee was then prepped with DuraPrep and injected, superolateral approach, and injection with 40 mg of Depo-Medrol, 1 mL of clonidine and 8 mL of Marcaine without epinephrine.  Fracture boot applied, weightbearing as tolerated.  Encouraged follow up with me in 7 days for inspection of incision.    Burnard BuntingG. Scott Lillis Nuttle, M.D.    GSD/MEDQ  D:  07/01/2016  T:  07/01/2016  Job:  [id number]

## 2016-07-01 NOTE — Interval H&P Note (Signed)
History and Physical Interval Note:  07/01/2016 7:14 AM  Diane [name]  has presented today for surgery, with the diagnosis of RIGHT ANKLE RETAINED HARDWARE  The various methods of treatment have been discussed with the patient and family. After consideration of risks, benefits and other options for treatment, the patient has consented to  Procedure(s): HARDWARE REMOVAL (Right) as a surgical intervention .  The patient's history has been reviewed, patient examined, no change in status, stable for surgery.  I have reviewed the patient's chart and labs.  Questions were answered to the patient's satisfaction.     Heydy Montilla SCOTT

## 2016-07-02 ENCOUNTER — Encounter (HOSPITAL_COMMUNITY): Payer: Self-pay | Admitting: Orthopedic Surgery

## 2016-07-03 NOTE — Anesthesia Postprocedure Evaluation (Signed)
Anesthesia Post Note  Patient: Diane [name]  Procedure(s) Performed: Procedure(s) (LRB): HARDWARE REMOVAL (Right)  Patient location during evaluation: PACU Anesthesia Type: General and Regional Level of consciousness: awake Pain management: pain level controlled Vital Signs Assessment: post-procedure vital signs reviewed and stable Respiratory status: spontaneous breathing Cardiovascular status: stable Postop Assessment: no signs of nausea or vomiting Anesthetic complications: no    Last Vitals:  Vitals:   07/01/16 1656 07/01/16 1701  BP: 106/68 97/63  Pulse: 81 77  Resp: 16 18  Temp: 36.7 C     Last Pain:  Vitals:   07/02/16 1155  TempSrc:   PainSc: 4                  Saachi Zale

## 2016-07-09 ENCOUNTER — Telehealth: Payer: Self-pay | Admitting: Family Medicine

## 2016-07-09 NOTE — Telephone Encounter (Signed)
° °  Pt called to ask for a refill on Tussionex   [phone]

## 2016-07-10 NOTE — Telephone Encounter (Signed)
No refills. She was seen a month ago. She can use Delsym prn

## 2016-07-11 NOTE — Telephone Encounter (Signed)
I spoke with pt and gave below message.  

## 2016-07-16 ENCOUNTER — Telehealth: Payer: Self-pay

## 2016-07-16 NOTE — Telephone Encounter (Signed)
[facility] for Proventil. PA submitted & is pending. Key: [id number]

## 2016-07-17 ENCOUNTER — Telehealth: Payer: Self-pay | Admitting: Family Medicine

## 2016-07-17 NOTE — Telephone Encounter (Signed)
The proventil was denied

## 2016-07-18 ENCOUNTER — Other Ambulatory Visit: Payer: Self-pay | Admitting: Orthopedic Surgery

## 2016-07-18 DIAGNOSIS — M25571 Pain in right ankle and joints of right foot: Secondary | ICD-10-CM

## 2016-07-22 ENCOUNTER — Telehealth: Payer: Self-pay | Admitting: Family Medicine

## 2016-07-22 NOTE — Telephone Encounter (Signed)
I spoke with pt and she has new insurance and will give pharmacy this information.

## 2016-07-22 NOTE — Telephone Encounter (Signed)
Refill request for Diazepam 5 mg and [facility] to [facility].

## 2016-07-22 NOTE — Telephone Encounter (Signed)
Call in #60 with 2 rf 

## 2016-07-24 ENCOUNTER — Telehealth: Payer: Self-pay | Admitting: Family Medicine

## 2016-07-24 MED ORDER — DIAZEPAM 5 MG PO TABS: 5.0000 mg | ORAL_TABLET | Freq: Two times a day (BID) | ORAL | 2 refills | Status: DC | PRN

## 2016-07-24 NOTE — Telephone Encounter (Signed)
Received a fax from the pharmacy.  Pt must try and fail ProAir HFA and Ventolin HFA.  Please prescribe new medication.  Thanks!!!

## 2016-07-24 NOTE — Telephone Encounter (Signed)
I called in script to below pharmacy.  

## 2016-07-25 NOTE — Telephone Encounter (Signed)
I spoke with [facility] and pt's co pay is $60 for all brands. I did speak with pt and she is not going to pay this amount.

## 2016-07-25 NOTE — Telephone Encounter (Signed)
Switch from Proventil to Ventolin HFA. Call this in with the same directions

## 2016-07-30 ENCOUNTER — Ambulatory Visit
Admission: RE | Admit: 2016-07-30 | Discharge: 2016-07-30 | Disposition: A | Discharge status: Home / Self Care | Payer: BLUE CROSS/BLUE SHIELD | Source: Ambulatory Visit | Attending: Orthopedic Surgery | Admitting: Orthopedic Surgery

## 2016-07-30 DIAGNOSIS — M25571 Pain in right ankle and joints of right foot: Secondary | ICD-10-CM

## 2016-08-12 LAB — HM PAP SMEAR

## 2016-08-15 ENCOUNTER — Encounter: Payer: Self-pay | Admitting: Family Medicine

## 2016-08-15 ENCOUNTER — Ambulatory Visit (INDEPENDENT_AMBULATORY_CARE_PROVIDER_SITE_OTHER): Payer: BLUE CROSS/BLUE SHIELD | Admitting: Family Medicine

## 2016-08-15 VITALS — BP 96/63 | HR 87 | Temp 98.3°F | Ht 67.0 in | Wt 212.0 lb

## 2016-08-15 DIAGNOSIS — J019 Acute sinusitis, unspecified: Secondary | ICD-10-CM

## 2016-08-15 MED ORDER — FLUCONAZOLE 150 MG PO TABS: 150.0000 mg | ORAL_TABLET | Freq: Once | ORAL | 5 refills | Status: AC

## 2016-08-15 MED ORDER — LEVOFLOXACIN 500 MG PO TABS
500.0000 mg | ORAL_TABLET | Freq: Every day | ORAL | 0 refills | Status: AC
Start: 2016-08-15 — End: 2016-08-25

## 2016-08-15 MED ORDER — TRAMADOL HCL 50 MG PO TABS: 100.0000 mg | ORAL_TABLET | Freq: Four times a day (QID) | ORAL | 2 refills | Status: DC | PRN

## 2016-08-15 MED ORDER — HYDROCODONE-HOMATROPINE 5-1.5 MG/5ML PO SYRP: 5.0000 mL | ORAL_SOLUTION | ORAL | 0 refills | Status: DC | PRN

## 2016-08-15 NOTE — Progress Notes (Signed)
   Subjective:    Patient ID: Diane [name], female    DOB: 03/06/1981, 35 y.o.   MRN: [id number]  HPI Here for 3 days of sinus pressure, PND, ST , and a dry cough.   Review of Systems  Constitutional: Negative.   HENT: Positive for congestion, postnasal drip, sinus pressure and sore throat.   Eyes: Negative.   Respiratory: Positive for cough.        Objective:   Physical Exam  Constitutional: She appears well-developed and well-nourished.  HENT:  Right Ear: External ear normal.  Left Ear: External ear normal.  Nose: Nose normal.  Mouth/Throat: Oropharynx is clear and moist.  Eyes: Conjunctivae are normal.  Neck: No thyromegaly present.  Cardiovascular: Normal rate, regular rhythm, normal heart sounds and intact distal pulses.   Pulmonary/Chest: Effort normal and breath sounds normal.  Lymphadenopathy:    She has no cervical adenopathy.          Assessment & Plan:  Sinusitis, treat with Levaquin.  Nelwyn SalisburyFRY,Janvi Ammar A, MD

## 2016-08-15 NOTE — Progress Notes (Signed)
Pre visit review using our clinic review tool, if applicable. No additional management support is needed unless otherwise documented below in the visit note. 

## 2016-08-19 ENCOUNTER — Telehealth: Payer: Self-pay | Admitting: Family Medicine

## 2016-08-19 NOTE — Telephone Encounter (Signed)
Pt would like to have Tussinex due to the cough syrup that she has is not working long enough.   PharmMarolyn Haller:  [facility]

## 2016-08-21 NOTE — Telephone Encounter (Signed)
Pt has a fever 101.5 and body aches. Please advise. Pt was seen on 08-15-16

## 2016-08-22 ENCOUNTER — Encounter: Payer: Self-pay | Admitting: Family Medicine

## 2016-08-22 ENCOUNTER — Ambulatory Visit (INDEPENDENT_AMBULATORY_CARE_PROVIDER_SITE_OTHER): Payer: BLUE CROSS/BLUE SHIELD | Admitting: Family Medicine

## 2016-08-22 ENCOUNTER — Other Ambulatory Visit: Payer: Self-pay | Admitting: *Deleted

## 2016-08-22 ENCOUNTER — Ambulatory Visit (INDEPENDENT_AMBULATORY_CARE_PROVIDER_SITE_OTHER)
Admission: RE | Admit: 2016-08-22 | Discharge: 2016-08-22 | Disposition: A | Discharge status: Home / Self Care | Payer: BLUE CROSS/BLUE SHIELD | Source: Ambulatory Visit | Attending: Family Medicine | Admitting: Family Medicine

## 2016-08-22 VITALS — BP 100/72 | HR 90 | Temp 98.3°F | Ht 67.0 in | Wt 215.7 lb

## 2016-08-22 DIAGNOSIS — R05 Cough: Secondary | ICD-10-CM

## 2016-08-22 DIAGNOSIS — J988 Other specified respiratory disorders: Secondary | ICD-10-CM | POA: Diagnosis not present

## 2016-08-22 DIAGNOSIS — R059 Cough, unspecified: Secondary | ICD-10-CM

## 2016-08-22 MED ORDER — HYDROCOD POLST-CPM POLST ER 10-8 MG/5ML PO SUER
5.0000 mL | Freq: Every evening | ORAL | 0 refills | Status: DC | PRN
Start: 2016-08-22 — End: 2016-08-25

## 2016-08-22 MED ORDER — PREDNISONE 20 MG PO TABS: 40.0000 mg | ORAL_TABLET | Freq: Every day | ORAL | 0 refills | Status: DC

## 2016-08-22 NOTE — Progress Notes (Signed)
HPI:  Diane [name] is a pleasant 35 yo here for and acute visit for cough: -reports started 8 days ago -symptoms include productive cough, nasal congestion, drainage, low grade fevers, wheezing at times, malaise and body aches -treated with levaquin and hycodan by PCP 9/29 -reports this did not help and that tussionex has always worked better for her and is the only thing that helps cough at night so that she can sleep - reports does not use the valium or tramadol on her list on a daily basis and would not use with cough medication -she reports a history of asthma and reports usually requires prednisone also -reports she is out of her albuterol inhaler -denies: fever today, NVD, rash, tick bite, sob   ROS: See pertinent positives and negatives per HPI.  Past Medical History:  Diagnosis Date  . ADD (attention deficit disorder with hyperactivity)    no current med.  . Anemia   . Anxiety   . Arthritis    R knee   . 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 COPD (chronic obstructive pulmonary disease) (HCC)    bronchitis treated - mid July - 2017, has finished antibiotic   . Depression   . Family history of adverse reaction to anesthesia    pt's father has hx. of being hard to wake up post-op  . GERD (gastroesophageal reflux disease)   . History of gastric ulcer   . History of kidney stones   . History of kidney stones   . IBS (irritable bowel syndrome)   . Migraine    sees Darrow Bussing MD , Migraines   . 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 PONV (postoperative nausea and vomiting)    also anesth. induces headache   . Shortness of breath dyspnea   . Wears partial dentures    upper    Past Surgical History:  Procedure Laterality Date  . APPENDECTOMY    . BLADDER SURGERY     x 2 - as a child to stretch bladder  . CESAREAN SECTION  12/24/2009   x2  . CESAREAN SECTION  11/20/2011   Procedure: CESAREAN SECTION;  Surgeon: Lenoard Aden, MD;  Location: [facility];   Service: Gynecology;  Laterality: N/A;  . CHROMOPERTUBATION  03/05/2007  . CYSTOSCOPY WITH RETROGRADE PYELOGRAM, URETEROSCOPY AND STENT PLACEMENT Left 08/10/2006  . HARDWARE REMOVAL Right 07/01/2016   Procedure: HARDWARE REMOVAL;  Surgeon: Cammy Copa, MD;  Location: [facility];  Service: Orthopedics;  Laterality: Right;  . HYSTEROSCOPY W/ ENDOMETRIAL ABLATION  03/05/2007  . KNEE ARTHROSCOPY WITH LATERAL RELEASE Right 12/13/2014   Procedure: KNEE ARTHROSCOPY WITH LATERAL RELEASE;  Surgeon: Thera Flake., MD;  Location: [facility];  Service: Orthopedics;  Laterality: Right;  . LAPAROSCOPIC APPENDECTOMY N/A 06/11/2014   Procedure: APPENDECTOMY LAPAROSCOPIC;  Surgeon: Velora Heckler, MD;  Location: [facility];  Service: General;  Laterality: N/A;  . ORIF ANKLE FRACTURE Right 01/05/2016   Procedure: OPEN REDUCTION INTERNAL FIXATION (ORIF) ANKLE FRACTURE;  Surgeon: Cammy Copa, MD;  Location: [facility] OR;  Service: Orthopedics;  Laterality: Right;  . OVARIAN CYST SURGERY Right 03/05/2007  . 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 SYNOVECTOMY Right 12/13/2014   Procedure: PLICA SYNOVECTOMY;  Surgeon: Thera Flake., MD;  Location: [facility];  Service: Orthopedics;  Laterality: Right;  . TONSILLECTOMY AND ADENOIDECTOMY    . WISDOM TOOTH EXTRACTION  Family History  Problem Relation Age of Onset  . Brain cancer Father   . Anesthesia problems Father     hard to wake up post-op  . Lung cancer Father   . Breast cancer Maternal Grandmother   . Ovarian cancer Maternal Grandmother   . Diabetes Maternal Grandmother   . Heart disease Maternal Grandmother   . Irritable bowel syndrome Mother   . Prostate cancer Maternal Grandfather   . Melanoma Maternal Grandfather   . Colon cancer Neg Hx   . Esophageal cancer Neg Hx   . Stomach cancer Neg Hx   . Rectal cancer Neg Hx     Social History   Social History  . Marital status: Married    Spouse name: N/A   . Number of children: 2  . Years of education: N/A   Occupational History  . 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 Smoking status: Current Every Day Smoker    Packs/day: 0.25    Years: 10.00    Types: Cigarettes  . Smokeless tobacco: Never Used     Comment: or less  . Alcohol use 0.0 oz/week     Comment: rare  . Drug use: No  . Sexual activity: Yes    Birth control/ protection: Other-see comments     Comment: no BC, reports " we're very careful"   Other Topics Concern  . None   Social History Narrative  . None     Current Outpatient Prescriptions:  .  albuterol (PROVENTIL HFA;VENTOLIN HFA) 108 (90 Base) MCG/ACT inhaler, Inhale 2 puffs into the lungs every 4 (four) hours as needed for wheezing or shortness of breath., Disp: 1 Inhaler, Rfl: 2 .  diazepam (VALIUM) 5 MG tablet, Take 1 tablet (5 mg total) by mouth every 12 (twelve) hours as needed for anxiety., Disp: 60 tablet, Rfl: 2 .  dicyclomine (BENTYL) 10 MG capsule, Take 1 capsule (10 mg total) by mouth 3 (three) times daily before meals., Disp: 90 capsule, Rfl: 5 .  diphenoxylate-atropine (LOMOTIL) 2.5-0.025 MG per tablet, Take 1 tablet by mouth 4 (four) times daily as needed for diarrhea or loose stools., Disp: 60 tablet, Rfl: 2 .  Fe-Succ Ac-C-Thre Ac-B12-FA (FERREX 150 FORTE PLUS) 50-100 MG CAPS, Take 150 mg by mouth 2 (two) times daily., Disp: 180 each, Rfl: 3 .  gabapentin (NEURONTIN) 300 MG capsule, Take 600 mg by mouth 2 (two) times daily. Takes 300mg  every morning, 600mg  at bedtime, Disp: , Rfl:  .  levofloxacin (LEVAQUIN) 500 MG tablet, Take 1 tablet (500 mg total) by mouth daily., Disp: 10 tablet, Rfl: 0 .  Magnesium 400 MG TABS, Take 1,200 mg by mouth daily. , Disp: , Rfl:  .  oxybutynin (DITROPAN-XL) 10 MG 24 hr tablet, Take 1 tablet (10 mg total) by mouth at bedtime., Disp: 30 tablet, Rfl: 11 .  promethazine (PHENERGAN) 25 MG tablet, Take 1 tablet (25 mg total) by mouth every 6  (six) hours as needed for nausea or vomiting., Disp: 60 tablet, Rfl: 5 .  ranitidine (ZANTAC) 150 MG capsule, Take 300 mg by mouth 2 (two) times daily. , Disp: , Rfl:  .  tiZANidine (ZANAFLEX) 4 MG tablet, Take 4 tablets by mouth at bedtime as needed (migraines). , Disp: , Rfl: 1 .  traMADol (ULTRAM) 50 MG tablet, Take 2 tablets (100 mg total) by mouth every 6 (six) hours as needed for moderate pain., Disp: 240 tablet, Rfl: 2 .  venlafaxine XR (EFFEXOR-XR) 150 MG 24 hr capsule, Take 300 mg by mouth daily before breakfast. , Disp: , Rfl:  .  vitamin B-12 (CYANOCOBALAMIN) 1000 MCG tablet, Take 1,000 mcg by mouth daily., Disp: , Rfl:  .  chlorpheniramine-HYDROcodone (TUSSIONEX PENNKINETIC ER) 10-8 MG/5ML SUER, Take 5 mLs by mouth at bedtime as needed for cough. DISCARD other cough syrups. DO NOT USE with TRAMADOL or ULTRAM or any other sedating medications., Disp: 115 mL, Rfl: 0  EXAM:  Vitals:   08/22/16 1523  BP: 100/72  Pulse: 90  Temp: 98.3 F (36.8 C)    Body mass index is 33.78 kg/m.  GENERAL: vitals reviewed and listed above, alert, oriented, appears well hydrated and in no acute distress  HEENT: atraumatic, conjunttiva clear, no obvious abnormalities on inspection of external nose and ears, normal appearance of ear canals and TMs, clear nasal congestion, mild post oropharyngeal erythema with PND, no tonsillar edema or exudate, no sinus TTP  NECK: no obvious masses on inspection  LUNGS: few scattered exp wheezes, no rales or rhonchi, good air movement  CV: HRRR, no peripheral edema  MS: moves all extremities without noticeable abnormality  PSYCH: pleasant and cooperative, no obvious depression or anxiety  ASSESSMENT AND PLAN:  Discussed the following assessment and plan:  Cough - Plan: DG Chest 2 View  Respiratory infection  -cxr to exclude LRI as she reports had fever 101 last night, but suspect viral illness with bronchitis  -refilled alb, pending cxr will start  prednisone -tussionex rx provided per her request after lengthy discussion proper use, interactions and risks as she feels is only thing that helps the cough so that she can sleep at night -return and ER precuations -Patient advised to return or notify a doctor immediately if symptoms worsen or persist or new concerns arise.  Patient Instructions  BEFORE YOU LEAVE: -follow up: as needed -xray sheet -prescription  Go get the xray right now. If no pneumonia will treat with prednisone.  Use the albuterol as needed.  You have decided to try a medication that is a controlled substance (Tussionex) for treatment of your medical problem. While this medication has benefits and can help you with your illness, as we discussed, it also has considerable risks. You have been properly informed of the risks and alternatives and re-iterate here that this medication can cause:  Altered Mental Capacity or Alertness: Do not drive or operate machinery while taking this medication  Dependence: A physiological state that can occur with regular drug use and results in withdrawal symptoms when drug use is abruptly discontinued. This can happen even with a short course of this medication.  Addiction: A chronic, relapsing disease characterized by compulsive drug-seeking and use despite negative consequences and by long-lasting changes in the brain.  Many, many more side effects including some that can be serious and/or life threatening. Please discuss these side effects with your pharmacist.  I advise: -that you use as little of this medication as possible and only as directed for as short of time as possible -keep this medication in a safe and locked location away from children or others -do not share this medication -dispose of any unused medication in a medication drop box -do not take this medication with other controlled or sedating substances, alcohol or drugs other then those approved by your doctor       Kriste BasqueKIM, Shalyn Koral R., DO

## 2016-08-22 NOTE — Patient Instructions (Addendum)
BEFORE YOU LEAVE: -follow up: as needed -xray sheet -prescription  Go get the xray right now. If no pneumonia will treat with prednisone.  Use the albuterol as needed.  You have decided to try a medication that is a controlled substance (Tussionex) for treatment of your medical problem. While this medication has benefits and can help you with your illness, as we discussed, it also has considerable risks. You have been properly informed of the risks and alternatives and re-iterate here that this medication can cause:  Altered Mental Capacity or Alertness: Do not drive or operate machinery while taking this medication  Dependence: A physiological state that can occur with regular drug use and results in withdrawal symptoms when drug use is abruptly discontinued. This can happen even with a short course of this medication.  Addiction: A chronic, relapsing disease characterized by compulsive drug-seeking and use despite negative consequences and by long-lasting changes in the brain.  Many, many more side effects including some that can be serious and/or life threatening. Please discuss these side effects with your pharmacist.  I advise: -that you use as little of this medication as possible and only as directed for as short of time as possible -keep this medication in a safe and locked location away from children or others -do not share this medication -dispose of any unused medication in a medication drop box -do not take this medication with other controlled or sedating substances, alcohol or drugs other then those approved by your doctor

## 2016-08-22 NOTE — Progress Notes (Signed)
Pre visit review using our clinic review tool, if applicable. No additional management support is needed unless otherwise documented below in the visit note. 

## 2016-08-25 ENCOUNTER — Ambulatory Visit (INDEPENDENT_AMBULATORY_CARE_PROVIDER_SITE_OTHER): Payer: BLUE CROSS/BLUE SHIELD | Admitting: Family Medicine

## 2016-08-25 ENCOUNTER — Encounter: Payer: Self-pay | Admitting: Family Medicine

## 2016-08-25 VITALS — BP 104/72 | HR 99 | Temp 98.6°F

## 2016-08-25 DIAGNOSIS — R05 Cough: Secondary | ICD-10-CM | POA: Diagnosis not present

## 2016-08-25 DIAGNOSIS — M25571 Pain in right ankle and joints of right foot: Secondary | ICD-10-CM | POA: Diagnosis not present

## 2016-08-25 DIAGNOSIS — R053 Chronic cough: Secondary | ICD-10-CM

## 2016-08-25 DIAGNOSIS — G8929 Other chronic pain: Secondary | ICD-10-CM | POA: Diagnosis not present

## 2016-08-25 DIAGNOSIS — J209 Acute bronchitis, unspecified: Secondary | ICD-10-CM | POA: Diagnosis not present

## 2016-08-25 MED ORDER — GABAPENTIN 300 MG PO CAPS: 600.0000 mg | ORAL_CAPSULE | Freq: Two times a day (BID) | ORAL | 5 refills | Status: DC

## 2016-08-25 MED ORDER — OXYCODONE-ACETAMINOPHEN 10-325 MG PO TABS: 1.0000 | ORAL_TABLET | Freq: Three times a day (TID) | ORAL | 0 refills | Status: DC | PRN

## 2016-08-25 MED ORDER — IPRATROPIUM-ALBUTEROL 0.5-2.5 (3) MG/3ML IN SOLN
3.0000 mL | Freq: Once | RESPIRATORY_TRACT | Status: AC
  Administered 2016-08-25: 3 mL via RESPIRATORY_TRACT

## 2016-08-25 MED ORDER — ALBUTEROL SULFATE HFA 108 (90 BASE) MCG/ACT IN AERS: 2.0000 | INHALATION_SPRAY | RESPIRATORY_TRACT | 11 refills | Status: DC | PRN

## 2016-08-25 MED ORDER — HYDROCOD POLST-CPM POLST ER 10-8 MG/5ML PO SUER: 5.0000 mL | Freq: Two times a day (BID) | ORAL | 0 refills | Status: DC | PRN

## 2016-08-25 MED ORDER — METHYLPREDNISOLONE 4 MG PO TBPK: ORAL_TABLET | ORAL | 0 refills | Status: DC

## 2016-08-25 MED ORDER — IPRATROPIUM-ALBUTEROL 0.5-2.5 (3) MG/3ML IN SOLN: 3.0000 mL | Freq: Once | RESPIRATORY_TRACT | Status: DC

## 2016-08-25 NOTE — Progress Notes (Signed)
   Subjective:    Patient ID: Diane [name], female    DOB: 04/04/1981, 35 y.o.   MRN: [id number]  HPI Here to follow up on several issues. First she continues to have a hard dry cough with wheezing. She was seen here on 08-15-16 for this and was started on Levaquin. She did not improve and she was seen again on 08-22-16. She got a CXR that day which showed interstitial edema which had not been seen before. No infiltrates. She was given a steroid taper but this has not helped either. Today sh is still very SOB and coughs frequently. No chest pain or fever. The other issue is severe pain in the right foot and ankle despite having hardware removed about 2 months ago. She has been seeing Dr. Dorene GrebeScott Dean for this, but she is frustrated by a lack of response from his office when she calls them for help. She asks to see another orthopedist of possible.    Review of Systems  Constitutional: Negative.   HENT: Negative.   Eyes: Negative.   Respiratory: Positive for cough, chest tightness, shortness of breath and wheezing.   Cardiovascular: Negative.   Musculoskeletal: Positive for arthralgias and joint swelling.       Objective:   Physical Exam  Constitutional: She is oriented to person, place, and time.  Coughing frequently with audible wheezing   HENT:  Right Ear: External ear normal.  Left Ear: External ear normal.  Nose: Nose normal.  Mouth/Throat: Oropharynx is clear and moist.  Eyes: Conjunctivae are normal.  Neck: No thyromegaly present.  Cardiovascular: Normal rate, regular rhythm, normal heart sounds and intact distal pulses.   Pulmonary/Chest: She has no rales.  Diffuse loud wheezes and some rhonchi, airflow is decreased   Musculoskeletal:  Right foot appears normal with healing surgical scars, no swelling. The ankle is very tender however. No warmth or erythema.   Lymphadenopathy:    She has no cervical adenopathy.  Neurological: She is alert and oriented to person, place, and  time.          Assessment & Plan:  For the bronchitis, she was given a nebulizer treatment with Duoneb and this helped her airflow quite a bit. Given a Medrol dose pack and refilled her albuterol inhaler. Refer to Pulmonary to evaluate the interstitial process seen on the CXR. Refer to Dr. Toni ArthursJohn Hewitt for the chronic ankle pain.  Nelwyn SalisburyFRY,Navjot Pilgrim A, MD

## 2016-08-25 NOTE — Progress Notes (Signed)
Pre visit review using our clinic review tool, if applicable. No additional management support is needed unless otherwise documented below in the visit note. Pt unable to weigh 

## 2016-08-25 NOTE — Telephone Encounter (Signed)
Pt is on schedule to see Dr. Clent RidgesFry today 08/25/2016.

## 2016-08-25 NOTE — Telephone Encounter (Signed)
Please call and see how she is doing today

## 2016-08-27 ENCOUNTER — Ambulatory Visit: Payer: BLUE CROSS/BLUE SHIELD | Admitting: Family Medicine

## 2016-08-28 MED ORDER — IPRATROPIUM-ALBUTEROL 0.5-2.5 (3) MG/3ML IN SOLN
3.0000 mL | Freq: Once | RESPIRATORY_TRACT | Status: AC
  Administered 2016-08-28: 3 mL via RESPIRATORY_TRACT

## 2016-08-28 NOTE — Addendum Note (Signed)
Addended by: Raj JanusADKINS, MISTY T on: 08/28/2016 08:51 AM   Modules accepted: Orders

## 2016-09-08 ENCOUNTER — Telehealth (INDEPENDENT_AMBULATORY_CARE_PROVIDER_SITE_OTHER): Payer: Self-pay | Admitting: Orthopedic Surgery

## 2016-09-08 NOTE — Telephone Encounter (Signed)
Pt left vm and I returned her call. She wants all her images on a CD. She also wants a release form mailed to her which I place in the mail to her.

## 2016-09-10 ENCOUNTER — Other Ambulatory Visit: Payer: Self-pay | Admitting: Pulmonary Disease

## 2016-09-10 ENCOUNTER — Telehealth: Payer: Self-pay | Admitting: Pulmonary Disease

## 2016-09-10 ENCOUNTER — Encounter: Payer: Self-pay | Admitting: Pulmonary Disease

## 2016-09-10 ENCOUNTER — Other Ambulatory Visit (INDEPENDENT_AMBULATORY_CARE_PROVIDER_SITE_OTHER): Payer: BLUE CROSS/BLUE SHIELD

## 2016-09-10 ENCOUNTER — Ambulatory Visit (INDEPENDENT_AMBULATORY_CARE_PROVIDER_SITE_OTHER): Payer: PRIVATE HEALTH INSURANCE | Admitting: Pulmonary Disease

## 2016-09-10 VITALS — BP 126/76 | HR 100 | Ht 67.0 in | Wt 228.0 lb

## 2016-09-10 DIAGNOSIS — R053 Chronic cough: Secondary | ICD-10-CM

## 2016-09-10 DIAGNOSIS — R05 Cough: Secondary | ICD-10-CM

## 2016-09-10 LAB — CBC WITH DIFFERENTIAL/PLATELET
BASOS ABS: 0 10*3/uL (ref 0.0–0.1)
Basophils Relative: 0.6 % (ref 0.0–3.0)
EOS ABS: 0.4 10*3/uL (ref 0.0–0.7)
Eosinophils Relative: 7.1 % — ABNORMAL HIGH (ref 0.0–5.0)
HCT: 34.8 % — ABNORMAL LOW (ref 36.0–46.0)
Hemoglobin: 11.9 g/dL — ABNORMAL LOW (ref 12.0–15.0)
LYMPHS ABS: 2.3 10*3/uL (ref 0.7–4.0)
Lymphocytes Relative: 37.2 % (ref 12.0–46.0)
MCHC: 34.1 g/dL (ref 30.0–36.0)
MCV: 88.4 fl (ref 78.0–100.0)
MONOS PCT: 9.6 % (ref 3.0–12.0)
Monocytes Absolute: 0.6 10*3/uL (ref 0.1–1.0)
NEUTROS ABS: 2.8 10*3/uL (ref 1.4–7.7)
NEUTROS PCT: 45.5 % (ref 43.0–77.0)
PLATELETS: 234 10*3/uL (ref 150.0–400.0)
RBC: 3.94 Mil/uL (ref 3.87–5.11)
RDW: 13.1 % (ref 11.5–15.5)
WBC: 6.2 10*3/uL (ref 4.0–10.5)

## 2016-09-10 LAB — NITRIC OXIDE: NITRIC OXIDE: 6

## 2016-09-10 MED ORDER — AZELASTINE-FLUTICASONE 137-50 MCG/ACT NA SUSP: 1.0000 | Freq: Two times a day (BID) | NASAL | 2 refills | Status: DC

## 2016-09-10 NOTE — Patient Instructions (Signed)
We started you on chlorpheniramine 8 mg 3 times a day and dymista nasal spray We will schedule for pulmonary function tests and check an FENO We will check blood work with CBC with differential and a blood allergy profile. Continue using the albuterol inhaler as needed.  We will schedule for return follow-up in one month with a repeat chest x-ray.

## 2016-09-10 NOTE — Progress Notes (Signed)
Diane [name]    [id number]    1981-10-05  Primary Care Physician:FRY,STEPHEN A, MD  Referring Physician: Nelwyn Salisbury, MD [address] [place], [place] [address]  Chief complaint:  Consult for abnormal chest x-ray.  HPI: Ms. Needle is a 35 Y/O with PMH of GERD, IBS, Migraines, ADD. She had an episode of bronchitis early October 2017 and was treated with a course of Levaquin and prednisone taper x 2. CXR at time shows some mild interstitial thickening.   Her chief complaint today is dyspnea on exertion, Sh has chronic cough, non productive in nature, occasional wheezing. She has albuterol inhaler that she uses 1-2 times/day. No nocturnal awakening. Her GERD symptoms are controlled on ranitidine. She has symptoms of rhinitis and post nasal drip.   Outpatient Encounter Prescriptions as of 09/10/2016  Medication Sig  . albuterol (PROVENTIL HFA;VENTOLIN HFA) 108 (90 Base) MCG/ACT inhaler Inhale 2 puffs into the lungs every 4 (four) hours as needed for wheezing or shortness of breath.  . chlorpheniramine-HYDROcodone (TUSSIONEX PENNKINETIC ER) 10-8 MG/5ML SUER Take 5 mLs by mouth every 12 (twelve) hours as needed for cough.  . diazepam (VALIUM) 5 MG tablet Take 1 tablet (5 mg total) by mouth every 12 (twelve) hours as needed for anxiety.  . dicyclomine (BENTYL) 10 MG capsule Take 1 capsule (10 mg total) by mouth 3 (three) times daily before meals.  . diphenoxylate-atropine (LOMOTIL) 2.5-0.025 MG per tablet Take 1 tablet by mouth 4 (four) times daily as needed for diarrhea or loose stools.  Marland Kitchen Fe-Succ Ac-C-Thre Ac-B12-FA (FERREX 150 FORTE PLUS) 50-100 MG CAPS Take 150 mg by mouth 2 (two) times daily.  Marland Kitchen gabapentin (NEURONTIN) 300 MG capsule Take 2 capsules (600 mg total) by mouth 2 (two) times daily.  . Magnesium 400 MG TABS Take 1,200 mg by mouth daily.   . methylPREDNISolone (MEDROL DOSEPAK) 4 MG TBPK tablet As directed  . oxybutynin (DITROPAN-XL) 10 MG 24 hr tablet Take 1  tablet (10 mg total) by mouth at bedtime.  Marland Kitchen oxyCODONE-acetaminophen (PERCOCET) 10-325 MG tablet Take 1 tablet by mouth every 8 (eight) hours as needed for pain.  . ranitidine (ZANTAC) 150 MG capsule Take 300 mg by mouth 2 (two) times daily.   Marland Kitchen tiZANidine (ZANAFLEX) 4 MG tablet Take 4 tablets by mouth at bedtime as needed (migraines).   . traMADol (ULTRAM) 50 MG tablet Take 2 tablets (100 mg total) by mouth every 6 (six) hours as needed for moderate pain.  Marland Kitchen venlafaxine XR (EFFEXOR-XR) 150 MG 24 hr capsule Take 300 mg by mouth daily before breakfast.   . vitamin B-12 (CYANOCOBALAMIN) 1000 MCG tablet Take 1,000 mcg by mouth daily.   No facility-administered encounter medications on file as of 09/10/2016.     Allergies as of 09/10/2016 - Review Complete 09/10/2016  Allergen Reaction Noted  . Nsaids Other (See Comments) 11/21/2011  . Latex Itching and Rash 01/05/2016  . Metoclopramide hcl Other (See Comments) 10/09/2008  . Prochlorperazine edisylate Other (See Comments) 10/09/2008    Past Medical History:  Diagnosis Date  . ADD (attention deficit disorder with hyperactivity)    no current med.  . Anemia   . Anxiety   . Arthritis    R knee   . 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 COPD (chronic obstructive pulmonary disease) (HCC)    bronchitis treated - mid July - 2017, has finished antibiotic   . Depression   . Family history  of adverse reaction to anesthesia    pt's father has hx. of being hard to wake up post-op  . GERD (gastroesophageal reflux disease)   . History of gastric ulcer   . History of kidney stones   . History of kidney stones   . IBS (irritable bowel syndrome)   . Migraine    sees Darrow Bussing MD , Migraines   . 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 PONV (postoperative nausea and vomiting)    also anesth. induces headache   . Shortness of breath dyspnea   . Wears partial dentures    upper    Past Surgical History:  Procedure Laterality Date  .  APPENDECTOMY    . BLADDER SURGERY     x 2 - as a child to stretch bladder  . CESAREAN SECTION  12/24/2009   x2  . CESAREAN SECTION  11/20/2011   Procedure: CESAREAN SECTION;  Surgeon: Lenoard Aden, MD;  Location: [facility];  Service: Gynecology;  Laterality: N/A;  . CHROMOPERTUBATION  03/05/2007  . CYSTOSCOPY WITH RETROGRADE PYELOGRAM, URETEROSCOPY AND STENT PLACEMENT Left 08/10/2006  . HARDWARE REMOVAL Right 07/01/2016   Procedure: HARDWARE REMOVAL;  Surgeon: Cammy Copa, MD;  Location: [facility] OR;  Service: Orthopedics;  Laterality: Right;  . HYSTEROSCOPY W/ ENDOMETRIAL ABLATION  03/05/2007  . KNEE ARTHROSCOPY WITH LATERAL RELEASE Right 12/13/2014   Procedure: KNEE ARTHROSCOPY WITH LATERAL RELEASE;  Surgeon: Thera Flake., MD;  Location: [facility];  Service: Orthopedics;  Laterality: Right;  . LAPAROSCOPIC APPENDECTOMY N/A 06/11/2014   Procedure: APPENDECTOMY LAPAROSCOPIC;  Surgeon: Velora Heckler, MD;  Location: [facility];  Service: General;  Laterality: N/A;  . ORIF ANKLE FRACTURE Right 01/05/2016   Procedure: OPEN REDUCTION INTERNAL FIXATION (ORIF) ANKLE FRACTURE;  Surgeon: Cammy Copa, MD;  Location: [facility] OR;  Service: Orthopedics;  Laterality: Right;  . OVARIAN CYST SURGERY Right 03/05/2007  . 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 SYNOVECTOMY Right 12/13/2014   Procedure: PLICA SYNOVECTOMY;  Surgeon: Thera Flake., MD;  Location: [facility];  Service: Orthopedics;  Laterality: Right;  . TONSILLECTOMY AND ADENOIDECTOMY    . WISDOM TOOTH EXTRACTION      Family History  Problem Relation Age of Onset  . Brain cancer Father   . Anesthesia problems Father     hard to wake up post-op  . Lung cancer Father   . Breast cancer Maternal Grandmother   . Ovarian cancer Maternal Grandmother   . Diabetes Maternal Grandmother   . Heart disease Maternal Grandmother   . Irritable bowel syndrome Mother   . Prostate cancer Maternal Grandfather   .  Melanoma Maternal Grandfather   . Colon cancer Neg Hx   . Esophageal cancer Neg Hx   . Stomach cancer Neg Hx   . Rectal cancer Neg Hx     Social History   Social History  . Marital status: Married    Spouse name: N/A  . Number of children: 2  . Years of education: N/A   Occupational History  . 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 Smoking status: Current Every Day Smoker    Packs/day: 0.50    Years: 10.00    Types: Cigarettes  . Smokeless tobacco: Never Used     Comment: or less  . Alcohol use 0.0 oz/week     Comment:  rare  . Drug use: No  . Sexual activity: Yes    Birth control/ protection: Other-see comments     Comment: no BC, reports " we're very careful"   Other Topics Concern  . Not on file   Social History Narrative  . No narrative on file     Review of systems: Review of Systems  Constitutional: Negative for fever and chills.  HENT: Negative.   Eyes: Negative for blurred vision.  Respiratory: as per HPI  Cardiovascular: Negative for chest pain and palpitations.  Gastrointestinal: Negative for vomiting, diarrhea, blood per rectum. Genitourinary: Negative for dysuria, urgency, frequency and hematuria.  Musculoskeletal: Negative for myalgias, back pain and joint pain.  Skin: Negative for itching and rash.  Neurological: Negative for dizziness, tremors, focal weakness, seizures and loss of consciousness.  Endo/Heme/Allergies: Negative for environmental allergies.  Psychiatric/Behavioral: Negative for depression, suicidal ideas and hallucinations.  All other systems reviewed and are negative.   Physical Exam: Blood pressure 126/76, pulse 100, height 5\' 7"  (1.702 m), weight 228 lb (103.4 kg), last menstrual period 08/11/2016, SpO2 93 %. Gen:      No acute distress HEENT:  EOMI, sclera anicteric Neck:     No masses; no thyromegaly Lungs:    Clear to auscultation bilaterally; normal respiratory effort CV:         Regular  rate and rhythm; no murmurs Abd:      + bowel sounds; soft, non-tender; no palpable masses, no distension Ext:    No edema; adequate peripheral perfusion Skin:      Warm and dry; no rash Neuro: alert and oriented x 3 Psych: normal mood and affect  Data Reviewed: CXR 08/22/16- Mild generalized interstitial thickening. Images reviewed  Assessment:  Asthmatic bronchitis Upper airway cough syndrome GERD  Evaluation for chronic cough, bronchitis, asthma. I suspect her primary issues are upper airway cough syndrome secondary to post nasal drip. Start antihistamine chrlopheniramine and dymista nasal spray. Continue albuterol inhaler PRN  I will check FENO, PFTS, CBC with diff and blood allergy profile. She will need a repeat CXR to ensure that the interstitial thickening seen on recent CXR has resolved.   Plan/Recommendations: - Chlorpheniramine, dymista - Check FENO, PFTs, CBC with diff and blood allergy profile - Continue albuterol - Follow chest x ray on return.  Chilton GreathousePraveen Liberato Stansbery MD [facility] Pager [phone] 09/10/2016, 11:06 AM  CC: Nelwyn SalisburyFry, Stephen A, MD

## 2016-09-11 LAB — RESPIRATORY ALLERGY PROFILE REGION II ~~LOC~~
Allergen, C. Herbarum, M2: <0.1 kU/L
Allergen, Cedar tree, t12: <0.1 kU/L
Allergen, Cottonwood, t14: <0.1 kU/L
Allergen, Mouse Urine Protein, e78: <0.1 kU/L
Allergen, P. notatum, m1: <0.1 kU/L
Aspergillus fumigatus, m3: <0.1 kU/L
Bermuda Grass: <0.1 kU/L
Common Ragweed: <0.1 kU/L
IGE (IMMUNOGLOBULIN E), SERUM: 81 kU/L (ref <115)
JOHNSON GRASS: 0.13 kU/L — AB
Pecan/Hickory Tree IgE: <0.1 kU/L
Timothy Grass: 1.63 kU/L — ABNORMAL HIGH

## 2016-09-11 MED ORDER — AZELASTINE HCL 0.1 % NA SOLN: 1.0000 | Freq: Two times a day (BID) | NASAL | 12 refills | Status: DC

## 2016-09-11 MED ORDER — FLUTICASONE PROPIONATE 50 MCG/ACT NA SUSP: 1.0000 | Freq: Every day | NASAL | 5 refills | Status: DC

## 2016-09-11 NOTE — Telephone Encounter (Signed)
Spoke with the pt  She states Dymista is not covered by her insurance and will cost 240$  She states her pharmacist advised that she could take the fluticasone and astelin in it's place  Pt states she would be fine with using 2 sprays  Is this okay to send, or do you have another rec? Please advise thanks!

## 2016-09-11 NOTE — Telephone Encounter (Signed)
lmtcb x1 for pt. 

## 2016-09-11 NOTE — Addendum Note (Signed)
Addended by: Velvet BatheAULFIELD, Verne L on: 09/11/2016 05:32 PM   Modules accepted: Orders

## 2016-09-11 NOTE — Telephone Encounter (Signed)
Pt returned phone call.the patient contact # [phone].Marland Kitchen.Charm RingsErica R [name]

## 2016-09-11 NOTE — Telephone Encounter (Signed)
rx sent to preferred pharmacy- pt aware of recs.  Nothing further needed.

## 2016-09-11 NOTE — Telephone Encounter (Signed)
Yes Ok to use fluticasone and astelin as separate sprays. Please send in the prescription.

## 2016-09-12 ENCOUNTER — Telehealth: Payer: Self-pay | Admitting: Family Medicine

## 2016-09-12 NOTE — Telephone Encounter (Signed)
See below, if cd has not been made for her, will you please do this?  thanks

## 2016-09-12 NOTE — Telephone Encounter (Signed)
Pt needs new rx percocet. Pt has an appt to see ortho early dec 2017

## 2016-09-12 NOTE — Telephone Encounter (Signed)
I've done my part. So, if the xray CD request has been taken care of for the patient. You may sign off this msg. Thanks.

## 2016-09-12 NOTE — Telephone Encounter (Signed)
CD has been made, patient was called 09/12/16 at 3:40pm. CD was left at front desk.

## 2016-09-15 MED ORDER — OXYCODONE-ACETAMINOPHEN 10-325 MG PO TABS: 1.0000 | ORAL_TABLET | Freq: Three times a day (TID) | ORAL | 0 refills | Status: DC | PRN

## 2016-09-15 NOTE — Telephone Encounter (Signed)
Script is ready for pick up here at front office and I spoke with pt.  

## 2016-09-15 NOTE — Telephone Encounter (Signed)
done

## 2016-09-16 ENCOUNTER — Encounter (INDEPENDENT_AMBULATORY_CARE_PROVIDER_SITE_OTHER): Payer: Self-pay

## 2016-09-16 ENCOUNTER — Ambulatory Visit (INDEPENDENT_AMBULATORY_CARE_PROVIDER_SITE_OTHER): Payer: PRIVATE HEALTH INSURANCE | Admitting: Podiatry

## 2016-09-16 ENCOUNTER — Other Ambulatory Visit: Payer: Self-pay | Admitting: Podiatry

## 2016-09-16 ENCOUNTER — Ambulatory Visit (HOSPITAL_BASED_OUTPATIENT_CLINIC_OR_DEPARTMENT_OTHER)
Admission: RE | Admit: 2016-09-16 | Discharge: 2016-09-16 | Disposition: A | Discharge status: Home / Self Care | Payer: PRIVATE HEALTH INSURANCE | Source: Ambulatory Visit | Attending: Podiatry | Admitting: Podiatry

## 2016-09-16 ENCOUNTER — Encounter: Payer: Self-pay | Admitting: Podiatry

## 2016-09-16 ENCOUNTER — Telehealth: Payer: Self-pay | Admitting: *Deleted

## 2016-09-16 DIAGNOSIS — S82831S Other fracture of upper and lower end of right fibula, sequela: Secondary | ICD-10-CM

## 2016-09-16 DIAGNOSIS — M899 Disorder of bone, unspecified: Secondary | ICD-10-CM

## 2016-09-16 DIAGNOSIS — R52 Pain, unspecified: Secondary | ICD-10-CM | POA: Diagnosis not present

## 2016-09-16 DIAGNOSIS — Z9109 Other allergy status, other than to drugs and biological substances: Secondary | ICD-10-CM

## 2016-09-16 DIAGNOSIS — M79671 Pain in right foot: Secondary | ICD-10-CM | POA: Diagnosis present

## 2016-09-16 DIAGNOSIS — S82301S Unspecified fracture of lower end of right tibia, sequela: Secondary | ICD-10-CM

## 2016-09-16 DIAGNOSIS — E559 Vitamin D deficiency, unspecified: Secondary | ICD-10-CM | POA: Diagnosis not present

## 2016-09-16 DIAGNOSIS — M949 Disorder of cartilage, unspecified: Secondary | ICD-10-CM | POA: Diagnosis not present

## 2016-09-16 LAB — CBC WITH DIFFERENTIAL/PLATELET
BASOS PCT: 0 %
Basophils Absolute: 0 cells/uL (ref 0–200)
EOS ABS: 399 {cells}/uL (ref 15–500)
EOS PCT: 7 %
HCT: 35.5 % (ref 35.0–45.0)
Hemoglobin: 11.9 g/dL (ref 11.7–15.5)
LYMPHS PCT: 42 %
Lymphs Abs: 2394 cells/uL (ref 850–3900)
MCH: 30.3 pg (ref 27.0–33.0)
MCHC: 33.5 g/dL (ref 32.0–36.0)
MCV: 90.3 fL (ref 80.0–100.0)
MONOS PCT: 9 %
MPV: 9.4 fL (ref 7.5–12.5)
Monocytes Absolute: 513 cells/uL (ref 200–950)
Neutro Abs: 2394 cells/uL (ref 1500–7800)
Neutrophils Relative %: 42 %
PLATELETS: 249 10*3/uL (ref 140–400)
RBC: 3.93 MIL/uL (ref 3.80–5.10)
RDW: 13.6 % (ref 11.0–15.0)
WBC: 5.7 10*3/uL (ref 3.8–10.8)

## 2016-09-16 MED ORDER — PROMETHAZINE HCL 25 MG PO TABS: 25.0000 mg | ORAL_TABLET | Freq: Three times a day (TID) | ORAL | 0 refills | Status: DC | PRN

## 2016-09-16 NOTE — Telephone Encounter (Addendum)
-----   Message from Vivi BarrackMatthew R Wagoner, DPM sent at 09/16/2016 12:39 PM EDT ----- Can you order CT scan of right ankle to look for fibula nonunion and osteochondral lesions. This is for pre-operative planning. [facility]. Merlyn AlbertFelicia - [facility] states she can perform the Heavy metal labs there, needs the [facility] codes not [facility] code. I reviewed the EPIC codes for Heavy Metal Lab and none coincided with [facility]. Felicia asked for the heavy metals Dr. Ardelle AntonWagoner was looking for. Dr. Ardelle AntonWagoner states he is looking for nickel and chromium. I informed Sunny SchleinFelicia, she states she will research to find a test that has these, but may have to order separately. 09/18/2016-Pt states yesterday woke afternoon, with left foot swollen, does Dr. Ardelle AntonWagoner want the left foot scanned. Dr. Ardelle AntonWagoner ordered CT left ankle.

## 2016-09-16 NOTE — Progress Notes (Signed)
   Subjective:    Patient ID: Diane [name], female    DOB: 12/11/1980, 35 y.o.   MRN: [id number]  HPI  35 year old female presents the office for concerns of right ankle pain. She previously had a trimalleolar ankle fracture she twisted her foot falling down a step. She said why he underwent open reduction internal fixation. She had an allergy to the metal she states some hardware is removed however she still gets significant swelling and pain to her ankle and she states it feels worse now than it did prior to the hardware removal. She also states that she's had a constant itch to the ankle pain all over her body she is concerned about possible metal poisoning. She has to wear cam boot due to the pain. She previously has had an MRI performed as well as multiple x-rays. No other complaints.  Review of Systems  Constitutional: Positive for fatigue, fever and unexpected weight change.  HENT: Positive for sinus pressure, sore throat and trouble swallowing.   Respiratory: Positive for cough.        Bronchitis  Cardiovascular:       Calf pain when walking  Gastrointestinal:       Bladder problems-leakage and IBS and stomach ulcer and some reflux  Musculoskeletal:       Muscle pain and some swelling and aches all over   Neurological: Positive for dizziness and headaches.  Hematological: Bruises/bleeds easily.  Psychiatric/Behavioral:       Depression and anxiety  All other systems reviewed and are negative.      Objective:   Physical Exam General: AAO x3, NAD  Dermatological: Incisions and the prior surgery are well-healed. There are excoriation marks on her ankle from where she is scratching. There are no open lesions or pre-ulcerative lesions. There is no drainage.  Vascular: Dorsalis Pedis artery and Posterior Tibial artery pedal pulses are 2/4 bilateral with immedate capillary fill time. There is no pain with calf compression, swelling, warmth, erythema.   Neruologic: Grossly intact  via light touch bilateral. Vibratory intact via tuning fork bilateral. Protective threshold with Semmes Wienstein monofilament intact to all pedal sites bilateral.  Musculoskeletal: There is significant tenderness palpation along the ankle joint that is fairly moderate edema to the area. There is no erythema or increase in warmth. There is pain with ankle joint range of motion. She did show me pictures to reveal significant swelling to her ankles well. There is no other areas of tenderness. Subtalar joint range of motion back. Manual muscle testing decrease in the right side do to pain.  Gait: Unassisted, Nonantalgic.      Assessment & Plan:  35 year old female right ankle pain -Treatment options discussed including all alternatives, risks, and complications -Etiology of symptoms were discussed -Chart was reviewed as well as previous MRI with x-rays. X-rays were ordered and reviewed with her today. 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 -Bloodwork ordered including ESR, CRP, ANA, rheumatoid factor, CBC. Also ordered a heavy metal profile. -Continue cam boot for now. -Discussed possible ankle aspiration. -Follow-up after CT scan or sooner if needed. Call any questions or concerns.  Celesta Gentile, DPM

## 2016-09-17 ENCOUNTER — Telehealth: Payer: Self-pay | Admitting: *Deleted

## 2016-09-17 DIAGNOSIS — M949 Disorder of cartilage, unspecified: Secondary | ICD-10-CM

## 2016-09-17 DIAGNOSIS — M899 Disorder of bone, unspecified: Secondary | ICD-10-CM

## 2016-09-17 HISTORY — DX: Disorder of bone, unspecified: M89.9

## 2016-09-17 HISTORY — DX: Disorder of cartilage, unspecified: M94.9

## 2016-09-17 LAB — VITAMIN D 25 HYDROXY (VIT D DEFICIENCY, FRACTURES): Vit D, 25-Hydroxy: 44 ng/mL (ref 30–100)

## 2016-09-17 LAB — C-REACTIVE PROTEIN: CRP: 22.6 mg/L — AB (ref <8.0)

## 2016-09-17 LAB — ANA: ANA: NEGATIVE

## 2016-09-17 LAB — RHEUMATOID FACTOR

## 2016-09-17 LAB — SEDIMENTATION RATE: SED RATE: 34 mm/h — AB (ref 0–20)

## 2016-09-17 NOTE — Telephone Encounter (Signed)
CT scan was authorized and faxed to the [facility].

## 2016-09-17 NOTE — Telephone Encounter (Signed)
"  She's coming in for a CT Scan of her right ankle without contrast that needs pre-certified with [employer].  Give me a call.  Thank you and I hope your day is great."

## 2016-09-18 ENCOUNTER — Other Ambulatory Visit: Payer: Self-pay | Admitting: Podiatry

## 2016-09-18 DIAGNOSIS — S82301S Unspecified fracture of lower end of right tibia, sequela: Secondary | ICD-10-CM

## 2016-09-18 DIAGNOSIS — R52 Pain, unspecified: Secondary | ICD-10-CM

## 2016-09-18 DIAGNOSIS — S82831S Other fracture of upper and lower end of right fibula, sequela: Principal | ICD-10-CM

## 2016-09-18 NOTE — Telephone Encounter (Signed)
Yes please

## 2016-09-18 NOTE — Telephone Encounter (Addendum)
-----   Message from Vivi BarrackMatthew R Wagoner, DPM sent at 09/18/2016  4:45 PM EDT ----- Can scan the foot with the CT scan.  ----- Message ----- From: Marissa NestleValery D O'Connell, RN Sent: 09/18/2016   2:45 PM To: Vivi BarrackMatthew R Wagoner, DPM  Dr. Ardelle AntonWagoner, so is that "yes please" for an appt? Since the left foot wasn't evaluated in that aspect. Valery. 09/19/2016-[employer] states pt's clinicals do not meet the requirement for medical necessity, PEER to PEER [id number] reference# id number. Informed Dr. Ardelle AntonWagoner and he states cancel the CT of left Ankle, pt has not been evaluated for the left foot. Cancelled Left ankle CT and informed pt, and that she should call for an appt for Tuesday of next week. Pt states understanding and asked if her blood work results were available. I told her I would inform Dr. Ardelle AntonWagoner of her request.

## 2016-09-19 ENCOUNTER — Ambulatory Visit (HOSPITAL_COMMUNITY): Payer: BLUE CROSS/BLUE SHIELD

## 2016-09-19 ENCOUNTER — Ambulatory Visit (HOSPITAL_BASED_OUTPATIENT_CLINIC_OR_DEPARTMENT_OTHER)
Admission: RE | Admit: 2016-09-19 | Discharge: 2016-09-19 | Disposition: A | Discharge status: Home / Self Care | Payer: No Typology Code available for payment source | Source: Ambulatory Visit | Attending: Podiatry | Admitting: Podiatry

## 2016-09-19 DIAGNOSIS — M65871 Other synovitis and tenosynovitis, right ankle and foot: Secondary | ICD-10-CM | POA: Diagnosis not present

## 2016-09-19 DIAGNOSIS — S8251XS Displaced fracture of medial malleolus of right tibia, sequela: Secondary | ICD-10-CM | POA: Insufficient documentation

## 2016-09-19 DIAGNOSIS — R52 Pain, unspecified: Secondary | ICD-10-CM

## 2016-09-19 DIAGNOSIS — S82831S Other fracture of upper and lower end of right fibula, sequela: Secondary | ICD-10-CM | POA: Diagnosis not present

## 2016-09-19 DIAGNOSIS — M25571 Pain in right ankle and joints of right foot: Secondary | ICD-10-CM | POA: Diagnosis present

## 2016-09-19 DIAGNOSIS — S82301S Unspecified fracture of lower end of right tibia, sequela: Secondary | ICD-10-CM

## 2016-09-19 DIAGNOSIS — M25471 Effusion, right ankle: Secondary | ICD-10-CM | POA: Diagnosis not present

## 2016-09-19 DIAGNOSIS — S82831K Other fracture of upper and lower end of right fibula, subsequent encounter for closed fracture with nonunion: Secondary | ICD-10-CM | POA: Diagnosis present

## 2016-09-19 LAB — HEAVY METALS PANEL, BLOOD: Mercury, B: <4 mcg/L (ref <=10)

## 2016-09-19 LAB — CHROMIUM LEVEL: Chromium: <0.5 mcg/L (ref <=1.2)

## 2016-09-20 NOTE — Telephone Encounter (Signed)
See result note.  

## 2016-09-22 ENCOUNTER — Ambulatory Visit: Payer: PRIVATE HEALTH INSURANCE | Admitting: Podiatry

## 2016-09-23 ENCOUNTER — Encounter: Payer: Self-pay | Admitting: Podiatry

## 2016-09-23 ENCOUNTER — Ambulatory Visit (INDEPENDENT_AMBULATORY_CARE_PROVIDER_SITE_OTHER): Payer: PRIVATE HEALTH INSURANCE | Admitting: Podiatry

## 2016-09-23 ENCOUNTER — Ambulatory Visit (HOSPITAL_BASED_OUTPATIENT_CLINIC_OR_DEPARTMENT_OTHER)
Admission: RE | Admit: 2016-09-23 | Discharge: 2016-09-23 | Disposition: A | Discharge status: Home / Self Care | Payer: BLUE CROSS/BLUE SHIELD | Source: Ambulatory Visit | Attending: Podiatry | Admitting: Podiatry

## 2016-09-23 DIAGNOSIS — M79672 Pain in left foot: Secondary | ICD-10-CM | POA: Insufficient documentation

## 2016-09-23 DIAGNOSIS — G90522 Complex regional pain syndrome I of left lower limb: Secondary | ICD-10-CM

## 2016-09-23 DIAGNOSIS — R52 Pain, unspecified: Secondary | ICD-10-CM

## 2016-09-23 DIAGNOSIS — M779 Enthesopathy, unspecified: Secondary | ICD-10-CM

## 2016-09-23 DIAGNOSIS — R609 Edema, unspecified: Secondary | ICD-10-CM

## 2016-09-23 DIAGNOSIS — M722 Plantar fascial fibromatosis: Secondary | ICD-10-CM

## 2016-09-23 DIAGNOSIS — M25471 Effusion, right ankle: Secondary | ICD-10-CM

## 2016-09-24 ENCOUNTER — Ambulatory Visit: Payer: PRIVATE HEALTH INSURANCE | Admitting: Podiatry

## 2016-09-24 LAB — NICKEL, SERUM/PLASMA: NICKEL, SERUM/PLASMA: NOT DETECTED ug/L

## 2016-09-25 ENCOUNTER — Telehealth: Payer: Self-pay | Admitting: *Deleted

## 2016-09-25 DIAGNOSIS — Z9889 Other specified postprocedural states: Secondary | ICD-10-CM

## 2016-09-25 DIAGNOSIS — R52 Pain, unspecified: Secondary | ICD-10-CM

## 2016-09-25 NOTE — Telephone Encounter (Signed)
She already had the CT scan.

## 2016-09-25 NOTE — Telephone Encounter (Addendum)
[facility] sent letter stating CT Scan was denied, request PEER TO PEER [phone] and follow the prompts. 09/29/2016-Pt request results to test. 09/30/2016-Pt request blood work results and pain management referral. Faxed required referral form, pt clinicals and demographics. 10/01/2016-I asked Amada Kingfisher if results for synovial fluid aspiration 09/23/2016 and Heavy metal result 09/16/2016 were available, she states yes and faxed to [facility]. Dr. Jacqualyn Posey reviewed the Labs, states heavy metal is normal and synovial fluid has no growth. Dr. Jacqualyn Posey ordered 12 day medrol dose pack, and repeat CBC, ESR, CRP, uric acid in 2 weeks. I informed pt of Dr. Leigh Aurora orders and pt states she really is miserable and would like an appt before 2 weeks.  I transferred to schedulers for an appt for Tuesday before Thanksgiving. 10/02/2016-Mailed labs to pt with reminder to have drawn 2 weeks from yesterday. 10/03/2016-Pt picked up rx for Oxycodone. 10/13/2016-Pt states Pain Management has not called, and she would like refill of Percocet, and she would like to know when she is to be seen again. Dr. Jacqualyn Posey states refill for #30, and have pt reschedule for next week, see if she has completed the steroid pak. I spoke with pt, she states she did not take the steroid pak, she was on steroid for 2 weeks not to long ago and takes for her migraines and feels it may be weakening her bones. Pt states her other doctor fills the Percocet for #60 so she won't have to have it refilled so often. I gave pt Pain Management's [phone] to check her status and told her Dr. Jacqualyn Posey wanted to see her next week and that he would only fill for #60 at a time. Informed pt she would need to pick up rx in the [place] office, and transferred to schedulers. Pt states she called Pain Management and it will be 2-3 weeks before she is scheduled, then they are scheduling in to January, pt states she would like to be referred to another facility. 10/14/2016-Faxed  required [facility] form, demographics and clinicals. 10/14/2016-Tish Randell Loop states she sees in their system orders have been put in for pt on 10/02/2016, but no test name is visible. I gave verbal orders for CBC with Diff,Sedimentation rate, uric acid and C-reactive protein. 10/22/2016-DrJacqualyn Posey states performed surgery on pt today and has found out pt is getting narcotics from multiple doctors. Dr. Jacqualyn Posey states he gave pt percocet today and will give tramadol if pain medication is requested, but Dr. Jacqualyn Posey or another [facility] doctor must be asked for any other pain medication refills. [place] states pt is requesting a knee scooter. I told her I would send a order to [facility] and they would contact pt. 10/27/2016-Pt states she fell 10/23/2016 in the snow and hurt her foot and hip and only has a day's worth of the pain medicine. I immediately gave message to D. Miner - scheduler to get pt in as soon as possible tomorrow to see Dr. Jacqualyn Posey. 10/31/2016-Pt states she is having pain along the side of her foot. Dr. Jacqualyn Posey said she could begin to put the toes down, now has bad pain. I told pt to go back to non-weight bearing using the crutch or knee scooter and I would have scheduler call and get her in on Monday. Pt states understanding.11/06/2016-Pt states she is having severe pain in side of foot and ankle, the nerve cream is helping the nerve pain, but not the sharp shooting pain up the  side of her ankle.  Dr. Jacqualyn Posey ordered Vicodin 5/325/mg #10 one tablet every 8 hours prn severe foot pain. I told pt and also told her to stay in the boot and rest not to be up on the foot more than 15 mins/ hour like initially post op. Pt states understanding and will pick up the rx in the [place] office.

## 2016-09-29 NOTE — Telephone Encounter (Signed)
I discussed the CT scan results with her last appointment and I even showed her pictures.   Can you also put in for a pain management consult to rule out CRPS? Thanks.

## 2016-09-30 NOTE — Progress Notes (Signed)
Subjective: This presents to the office today for follow-up evaluation of right ankle pain discussed CT scan results. She states that she still had quite a bit of pain is when to the right ankle. She is continuing the CAM boot. Since she has been a cam boot on the right side for so long she has been developing left pain in the arch of her foot into the heel as well. She denies any recent injury or trauma to the left foot. Denies any swelling or redness the left side. Denies any systemic complaints such as fevers, chills, nausea, vomiting. No acute changes since last appointment, and no other complaints at this time.   Objective: AAO x3, NAD DP/PT pulses palpable bilaterally, CRT less than 3 seconds Discontinuation of right ankle pain and there is pain with ankle range of motion. There is tenderness both the medial and lateral aspects of the ankle. There is mild edema to the ankle and appears to be somewhat improved with the last appointment however she states it is fluctuating. There is no erythema or increase in warmth. Incisions from prior surgery well healed. There is no open lesions. On the left side there is tenderness palpation of plantar medial tubercle of the calcaneus at insertion plantar fasciitis. She started some discomfort on the lateral aspect of the ankle course the peroneal tendons. The plantar fascia and tendons appear to be intact. No area pinpoint bony tenderness or pain the vibratory sensation. No open lesions or pre-ulcerative lesions.  No pain with calf compression, swelling, warmth, erythema  Assessment: Left foot plantar fasciitis, right ankle chronic pain  Plan: -All treatment options discussed with the patient including all alternatives, risks, complications.  -X-rays were taken the left foot. Attention stretching, icing. Ankle brace dispensed. She lesions. -On the right-sided discussed CT scan results. I also discuss aspiration of the ankle for cell count. Under sterile  conditions a mixture of lidocaine/Marcaine plain was infiltrated subdermally on the medial aspect of the ankle joint. Once anesthetized the skin was prepped in sterile fashion with Betadine preparation. 18-gauge needle was utilized to access the ankle joint which fluid was aspirated. Yellow fluid was aspirated small amount of blood. No pus. This was sent to pathology for cell count. Continue cam boot for now. We'll also refer to pain management for ongoing pain and swelling and rule out chronic regional pain syndrome. -Discussed possible ankle arthroscopy for the right ankle.  -Follow-up after the cell count and pain management consult. -Patient encouraged to call the office with any questions, concerns, change in symptoms.   Ovid CurdMatthew Wagoner, DPM

## 2016-10-01 MED ORDER — PREDNISONE 10 MG PO TABS: ORAL_TABLET | ORAL | 0 refills | Status: DC

## 2016-10-02 ENCOUNTER — Telehealth: Payer: Self-pay | Admitting: Podiatry

## 2016-10-02 ENCOUNTER — Other Ambulatory Visit: Payer: Self-pay

## 2016-10-02 ENCOUNTER — Ambulatory Visit (INDEPENDENT_AMBULATORY_CARE_PROVIDER_SITE_OTHER): Payer: PRIVATE HEALTH INSURANCE | Admitting: Family Medicine

## 2016-10-02 ENCOUNTER — Telehealth: Payer: Self-pay | Admitting: Pulmonary Disease

## 2016-10-02 ENCOUNTER — Encounter: Payer: Self-pay | Admitting: Family Medicine

## 2016-10-02 VITALS — BP 107/69 | HR 100 | Temp 98.4°F

## 2016-10-02 DIAGNOSIS — R053 Chronic cough: Secondary | ICD-10-CM

## 2016-10-02 DIAGNOSIS — R05 Cough: Secondary | ICD-10-CM | POA: Diagnosis not present

## 2016-10-02 DIAGNOSIS — S82891G Other fracture of right lower leg, subsequent encounter for closed fracture with delayed healing: Secondary | ICD-10-CM

## 2016-10-02 MED ORDER — HYDROCOD POLST-CPM POLST ER 10-8 MG/5ML PO SUER: 5.0000 mL | Freq: Two times a day (BID) | ORAL | 0 refills | Status: DC | PRN

## 2016-10-02 MED ORDER — OXYCODONE-ACETAMINOPHEN 10-325 MG PO TABS: 1.0000 | ORAL_TABLET | Freq: Three times a day (TID) | ORAL | 0 refills | Status: DC | PRN

## 2016-10-02 NOTE — Telephone Encounter (Signed)
Informed pt Dr. Ardelle AntonWagoner ordered Percocet 11/325 #30 1 tablet every 8 hours, and that [place].

## 2016-10-02 NOTE — Telephone Encounter (Signed)
Pt called and seen her pcp today and per pt  he said he would prefer you give pt pain medication since you are managing her for her foot pain. Pt states she has enough to last her today and would like you to refill it for her.

## 2016-10-02 NOTE — Telephone Encounter (Signed)
OK to refill as per her PCP- Percocet 10/325 1 every 8 h prn pain disp #30

## 2016-10-02 NOTE — Progress Notes (Signed)
   Subjective:    Patient ID: Diane Diane [name], female    DOB: 02/06/1981, 35 y.o.   MRN: [id number]  HPI Here asking for advice about her chronic cough and her right ankle pain. She has been seeing Dr. Isaiah SergeMannam for the cough and she sees Dr. Ardelle AntonWagoner for the ankle pain. She had the ankle aspirated recently and she is waiting on test results from that. Wearing Diane [name]. She has been trying Chlorpheniramine and albuterol for the cough. We had been supplying her with hydrocodone cough syrup and with hydrocodone meds for the ankle pain. She asks for refills today.    Review of Systems  Constitutional: Negative.   Eyes: Negative.   Respiratory: Positive for cough, choking and shortness of breath.   Cardiovascular: Negative.   Musculoskeletal: Positive for arthralgias.       Objective:   Physical Exam  Constitutional: She appears well-developed and well-nourished. No distress.  Cardiovascular: Normal rate, regular rhythm, normal heart sounds and intact distal pulses.   Pulmonary/Chest: Effort normal and breath sounds normal. No respiratory distress. She has no wheezes. She has no rales.  Musculoskeletal:  Wearing Diane [name] and Diane [name]          Assessment & Plan:  She has ankle pain from Diane [name] and Diane [name]. She is seeing specialists for both (Podiatry and Pulmonary). I told her that I can no longer supply narcotic medications for problems that other providers are treating. She will need to ask them for these medications. She understands.  Nelwyn SalisburyFRY,Diane Jasmin A, MD

## 2016-10-02 NOTE — Telephone Encounter (Signed)
OK to refill

## 2016-10-02 NOTE — Telephone Encounter (Signed)
Called and spoke with pt and she stated that she was seen by her PCP today and she stated that she has had this cough for about 6 weeks.  She stated that her PCP advised her that she would need to call our office and see if we could write for the cough meds since we have been treating her for this.  Pt stated that she was normally given tussionex.    Pt was last seen on 09/10/2016 PM is out of the office today. CY please advise. Thanks  Allergies  Allergen Reactions  . Nsaids Other (See Comments)    DUE TO HISTORY OF GASTRIC ULCER  . Latex Itching and Rash    Sounds like adhesive sensitivity   . Metoclopramide Hcl Other (See Comments)    RESTLESSNESS/JITTERY  . Prochlorperazine Edisylate Other (See Comments)    RESTLESSNESS/JITTERY

## 2016-10-02 NOTE — Progress Notes (Signed)
Pre visit review using our clinic review tool, if applicable. No additional management support is needed unless otherwise documented below in the visit note. Pt unable to weigh 

## 2016-10-02 NOTE — Telephone Encounter (Signed)
Called and spoke with pt. And informed her that her prescription would be sitting up front for her. The rx is done and signed and is up front. Nothing further is needed.

## 2016-10-02 NOTE — Telephone Encounter (Signed)
Ok tussionex 120 ml      5 ml every 12 hours if needed for cough.    She will need to follow up with Dr Isaiah SergeMannam on this cough.

## 2016-10-03 NOTE — Telephone Encounter (Signed)
No action required.

## 2016-10-07 ENCOUNTER — Ambulatory Visit: Payer: BLUE CROSS/BLUE SHIELD | Admitting: Podiatry

## 2016-10-13 MED ORDER — OXYCODONE-ACETAMINOPHEN 10-325 MG PO TABS: 1.0000 | ORAL_TABLET | Freq: Three times a day (TID) | ORAL | 0 refills | Status: DC | PRN

## 2016-10-14 ENCOUNTER — Ambulatory Visit (INDEPENDENT_AMBULATORY_CARE_PROVIDER_SITE_OTHER): Payer: PRIVATE HEALTH INSURANCE | Admitting: Podiatry

## 2016-10-14 ENCOUNTER — Encounter: Payer: Self-pay | Admitting: Podiatry

## 2016-10-14 DIAGNOSIS — M899 Disorder of bone, unspecified: Secondary | ICD-10-CM

## 2016-10-14 DIAGNOSIS — M779 Enthesopathy, unspecified: Secondary | ICD-10-CM

## 2016-10-14 DIAGNOSIS — M949 Disorder of cartilage, unspecified: Secondary | ICD-10-CM

## 2016-10-14 DIAGNOSIS — M19171 Post-traumatic osteoarthritis, right ankle and foot: Secondary | ICD-10-CM

## 2016-10-14 LAB — CBC WITH DIFFERENTIAL/PLATELET
BASOS ABS: 0 {cells}/uL (ref 0–200)
Basophils Relative: 0 %
EOS PCT: 8 %
Eosinophils Absolute: 408 cells/uL (ref 15–500)
HCT: 37.4 % (ref 35.0–45.0)
HEMOGLOBIN: 12.5 g/dL (ref 11.7–15.5)
LYMPHS ABS: 2244 {cells}/uL (ref 850–3900)
Lymphocytes Relative: 44 %
MCH: 29.5 pg (ref 27.0–33.0)
MCHC: 33.4 g/dL (ref 32.0–36.0)
MCV: 88.2 fL (ref 80.0–100.0)
MONO ABS: 408 {cells}/uL (ref 200–950)
MPV: 9.9 fL (ref 7.5–12.5)
Monocytes Relative: 8 %
NEUTROS ABS: 2040 {cells}/uL (ref 1500–7800)
Neutrophils Relative %: 40 %
Platelets: 270 10*3/uL (ref 140–400)
RBC: 4.24 MIL/uL (ref 3.80–5.10)
RDW: 13.3 % (ref 11.0–15.0)
WBC: 5.1 10*3/uL (ref 3.8–10.8)

## 2016-10-14 LAB — URIC ACID: URIC ACID, SERUM: 5.6 mg/dL (ref 2.5–7.0)

## 2016-10-14 NOTE — Patient Instructions (Signed)

## 2016-10-15 ENCOUNTER — Telehealth: Payer: Self-pay | Admitting: *Deleted

## 2016-10-15 LAB — C-REACTIVE PROTEIN: CRP: 19.6 mg/L — ABNORMAL HIGH (ref <8.0)

## 2016-10-15 LAB — SEDIMENTATION RATE: Sed Rate: 28 mm/hr — ABNORMAL HIGH (ref 0–20)

## 2016-10-15 NOTE — Telephone Encounter (Signed)
"  I was told to call you to set up my surgery.  I would like to have it done as soon as possible."  Let me call [facility] and see what they have available and give you a call back.  I am calling you back.  Dr. Ardelle AntonWagoner can do your surgery on Wednesday of next week, December 6.  Have you had a physical recently?  "Yes, I have and your all were going to fax the forms to my doctor."  Okay, you may want to follow up to make sure they complete the forms.  The forms need to be sent to us as well as [facility].  Someone from the surgical center will contact you with an arrival time.

## 2016-10-16 ENCOUNTER — Encounter (HOSPITAL_BASED_OUTPATIENT_CLINIC_OR_DEPARTMENT_OTHER): Payer: Self-pay | Admitting: *Deleted

## 2016-10-17 ENCOUNTER — Ambulatory Visit (HOSPITAL_COMMUNITY): Payer: BLUE CROSS/BLUE SHIELD

## 2016-10-17 ENCOUNTER — Ambulatory Visit: Payer: BLUE CROSS/BLUE SHIELD | Admitting: Pulmonary Disease

## 2016-10-17 DIAGNOSIS — Z7689 Persons encountering health services in other specified circumstances: Secondary | ICD-10-CM

## 2016-10-20 ENCOUNTER — Telehealth: Payer: Self-pay | Admitting: *Deleted

## 2016-10-20 NOTE — Telephone Encounter (Signed)
"  He's doing surgery on her on Wednesday.  I need orders as soon as possible."

## 2016-10-21 ENCOUNTER — Ambulatory Visit: Payer: BLUE CROSS/BLUE SHIELD | Admitting: Podiatry

## 2016-10-21 NOTE — Progress Notes (Signed)
Subjective: This presents to the office today for follow-up evaluation of right ankle pain.  She states that she still has significant pain to her right ankle she cannot come out of the cam boot due to the pain. She states that her ankle still swells quite a bit. She denies any significant redness or warmth to the area but the area is very painful with weightbearing and trying to move her ankle joint. She can use a runs a fever approximately 99 but does not go above this. She's had to continue with Percocet due to pain to her ankle. I did try to get her in with pain management however they are unable to see her until January. At this point she is very frustrated with her ankle and she is requesting any surgical intervention or any other intervention to help with her pain. Denies any systemic complaints such as night sweats, chills, nausea, vomiting. No acute changes since last appointment, and no other complaints at this time.   Objective: AAO x3, NAD DP/PT pulses palpable bilaterally, CRT less than 3 seconds There is continuation of right ankle pain and there is pain with ankle range of motion. There is tenderness both the medial and lateral aspects of the ankle. There is mild edema to the ankle and has been fluctuating. There is no erythema or increase in warmth. Incisions from prior surgery well healed. There are no open lesions. On the left side there is continued but somewhat improved tenderness to palpation of plantar medial tubercle of the calcaneus at insertion plantar fasciitis. The plantar fascia and tendons appear to be intact. No area pinpoint bony tenderness or pain the vibratory sensation. No open lesions or pre-ulcerative lesions.  No pain with calf compression, swelling, warmth, erythema  Assessment: Right chronic ankle pain  Plan: -All treatment options discussed with the patient including all alternatives, risks, complications.  -At this time given her continued ankle pain about  her to see pain management throughout CRPS however she has really no other clinical symptoms other than swelling. She has no discoloration and no excessive drying or sweating. We will continue Percocet for now. Also discussed the intervention as far as arthroscopy the ankle. Discussed with her ankle arthroscopy with synovectomy to help irrigate the ankle joint and help rule out any infection although the joint aspiration previously did not appear to be infectious. I discussed this is by no means any guarantee of symptoms and that she will still likely have pain after the procedure and this is more diagnostic and hopefully therapeutic in nature. She wishes to proceed with this intervention. -The incision placement as well as the postoperative course was discussed with the patient. I discussed risks of the surgery which include, but not limited to, infection, bleeding, pain, swelling, need for further surgery, delayed or nonhealing, painful or ugly scar, numbness or sensation changes, over/under correction, recurrence, transfer lesions, further deformity, hardware failure, DVT/PE, loss of toe/foot. Patient understands these risks and wishes to proceed with surgery. The surgical consent was reviewed with the patient all 3 pages were signed. No promises or guarantees were given to the outcome of the procedure. All questions were answered to the best of my ability. Before the surgery the patient was encouraged to call the office if there is any further questions. [facility] basis. -Repeat blood work today.   Ovid CurdMatthew Wagoner, DPM

## 2016-10-22 ENCOUNTER — Encounter (HOSPITAL_BASED_OUTPATIENT_CLINIC_OR_DEPARTMENT_OTHER)
Admission: RE | Disposition: A | Discharge status: Home / Self Care | Payer: Self-pay | Source: Ambulatory Visit | Attending: Podiatry

## 2016-10-22 ENCOUNTER — Encounter (HOSPITAL_BASED_OUTPATIENT_CLINIC_OR_DEPARTMENT_OTHER): Payer: Self-pay | Admitting: *Deleted

## 2016-10-22 ENCOUNTER — Ambulatory Visit (HOSPITAL_BASED_OUTPATIENT_CLINIC_OR_DEPARTMENT_OTHER): Payer: No Typology Code available for payment source | Admitting: Anesthesiology

## 2016-10-22 ENCOUNTER — Encounter: Payer: Self-pay | Admitting: Podiatry

## 2016-10-22 ENCOUNTER — Ambulatory Visit (HOSPITAL_BASED_OUTPATIENT_CLINIC_OR_DEPARTMENT_OTHER)
Admission: RE | Admit: 2016-10-22 | Discharge: 2016-10-22 | Disposition: A | Discharge status: Home / Self Care | Payer: No Typology Code available for payment source | Source: Ambulatory Visit | Attending: Podiatry | Admitting: Podiatry

## 2016-10-22 DIAGNOSIS — M19071 Primary osteoarthritis, right ankle and foot: Secondary | ICD-10-CM | POA: Diagnosis not present

## 2016-10-22 DIAGNOSIS — K589 Irritable bowel syndrome without diarrhea: Secondary | ICD-10-CM | POA: Insufficient documentation

## 2016-10-22 DIAGNOSIS — M86671 Other chronic osteomyelitis, right ankle and foot: Secondary | ICD-10-CM | POA: Diagnosis not present

## 2016-10-22 DIAGNOSIS — F419 Anxiety disorder, unspecified: Secondary | ICD-10-CM | POA: Diagnosis not present

## 2016-10-22 DIAGNOSIS — K219 Gastro-esophageal reflux disease without esophagitis: Secondary | ICD-10-CM | POA: Diagnosis not present

## 2016-10-22 DIAGNOSIS — F418 Other specified anxiety disorders: Secondary | ICD-10-CM | POA: Diagnosis not present

## 2016-10-22 DIAGNOSIS — Z79899 Other long term (current) drug therapy: Secondary | ICD-10-CM | POA: Diagnosis not present

## 2016-10-22 DIAGNOSIS — M1990 Unspecified osteoarthritis, unspecified site: Secondary | ICD-10-CM | POA: Diagnosis present

## 2016-10-22 DIAGNOSIS — M93271 Osteochondritis dissecans, right ankle and joints of right foot: Secondary | ICD-10-CM | POA: Diagnosis not present

## 2016-10-22 DIAGNOSIS — Z8711 Personal history of peptic ulcer disease: Secondary | ICD-10-CM | POA: Diagnosis not present

## 2016-10-22 DIAGNOSIS — J449 Chronic obstructive pulmonary disease, unspecified: Secondary | ICD-10-CM | POA: Insufficient documentation

## 2016-10-22 DIAGNOSIS — Z91048 Other nonmedicinal substance allergy status: Secondary | ICD-10-CM | POA: Diagnosis not present

## 2016-10-22 DIAGNOSIS — Z888 Allergy status to other drugs, medicaments and biological substances status: Secondary | ICD-10-CM | POA: Insufficient documentation

## 2016-10-22 DIAGNOSIS — Z87442 Personal history of urinary calculi: Secondary | ICD-10-CM | POA: Insufficient documentation

## 2016-10-22 DIAGNOSIS — F17200 Nicotine dependence, unspecified, uncomplicated: Secondary | ICD-10-CM | POA: Insufficient documentation

## 2016-10-22 DIAGNOSIS — R51 Headache: Secondary | ICD-10-CM | POA: Insufficient documentation

## 2016-10-22 HISTORY — DX: Benign intracranial hypertension: G93.2

## 2016-10-22 HISTORY — DX: Migraine, unspecified, not intractable, without status migrainosus: G43.909

## 2016-10-22 HISTORY — DX: Plantar fascial fibromatosis: M72.2

## 2016-10-22 HISTORY — DX: Disorder of cartilage, unspecified: M94.9

## 2016-10-22 HISTORY — DX: Unspecified osteoarthritis, unspecified site: M19.90

## 2016-10-22 HISTORY — PX: ANKLE ARTHROSCOPY: SHX545

## 2016-10-22 HISTORY — DX: Disorder of bone, unspecified: M89.9

## 2016-10-22 SURGERY — ARTHROSCOPY, ANKLE
Anesthesia: General | Laterality: Right

## 2016-10-22 MED ORDER — PHENYLEPHRINE 40 MCG/ML (10ML) SYRINGE FOR IV PUSH (FOR BLOOD PRESSURE SUPPORT)
PREFILLED_SYRINGE | INTRAVENOUS | Status: AC
  Filled 2016-10-22: qty 10

## 2016-10-22 MED ORDER — OXYCODONE-ACETAMINOPHEN 5-325 MG PO TABS: 1.0000 | ORAL_TABLET | ORAL | 0 refills | Status: DC | PRN

## 2016-10-22 MED ORDER — MEPERIDINE HCL 25 MG/ML IJ SOLN: 6.2500 mg | INTRAMUSCULAR | Status: DC | PRN

## 2016-10-22 MED ORDER — MIDAZOLAM HCL 2 MG/2ML IJ SOLN
INTRAMUSCULAR | Status: AC
  Filled 2016-10-22: qty 2

## 2016-10-22 MED ORDER — LACTATED RINGERS IV SOLN
INTRAVENOUS | Status: DC
  Administered 2016-10-22 (×2): via INTRAVENOUS
  Administered 2016-10-22: 10 mL/h via INTRAVENOUS

## 2016-10-22 MED ORDER — CEFAZOLIN SODIUM-DEXTROSE 2-4 GM/100ML-% IV SOLN
INTRAVENOUS | Status: AC
  Filled 2016-10-22: qty 100

## 2016-10-22 MED ORDER — CEFAZOLIN SODIUM-DEXTROSE 2-4 GM/100ML-% IV SOLN: 2.0000 g | INTRAVENOUS | Status: DC

## 2016-10-22 MED ORDER — BUPIVACAINE-EPINEPHRINE (PF) 0.5% -1:200000 IJ SOLN
INTRAMUSCULAR | Status: DC | PRN
  Administered 2016-10-22 (×2): 25 mL via PERINEURAL

## 2016-10-22 MED ORDER — CEPHALEXIN 500 MG PO CAPS: 500.0000 mg | ORAL_CAPSULE | Freq: Three times a day (TID) | ORAL | 2 refills | Status: DC

## 2016-10-22 MED ORDER — OXYCODONE HCL 5 MG PO TABS
ORAL_TABLET | ORAL | Status: AC
  Filled 2016-10-22: qty 1

## 2016-10-22 MED ORDER — PHENYLEPHRINE HCL 10 MG/ML IJ SOLN
INTRAMUSCULAR | Status: DC | PRN
  Administered 2016-10-22 (×4): 80 ug via INTRAVENOUS

## 2016-10-22 MED ORDER — DEXAMETHASONE SODIUM PHOSPHATE 10 MG/ML IJ SOLN
INTRAMUSCULAR | Status: DC | PRN
  Administered 2016-10-22: 10 mg via INTRAVENOUS

## 2016-10-22 MED ORDER — SCOPOLAMINE 1 MG/3DAYS TD PT72: 1.0000 | MEDICATED_PATCH | Freq: Once | TRANSDERMAL | Status: DC | PRN

## 2016-10-22 MED ORDER — PROPOFOL 10 MG/ML IV BOLUS
INTRAVENOUS | Status: DC | PRN
  Administered 2016-10-22: 200 mg via INTRAVENOUS

## 2016-10-22 MED ORDER — PROMETHAZINE HCL 25 MG PO TABS: 25.0000 mg | ORAL_TABLET | Freq: Three times a day (TID) | ORAL | 0 refills | Status: DC | PRN

## 2016-10-22 MED ORDER — MIDAZOLAM HCL 5 MG/5ML IJ SOLN
INTRAMUSCULAR | Status: DC | PRN
  Administered 2016-10-22: 2 mg via INTRAVENOUS

## 2016-10-22 MED ORDER — OXYCODONE HCL 5 MG PO TABS
5.0000 mg | ORAL_TABLET | Freq: Once | ORAL | Status: AC | PRN
  Administered 2016-10-22: 5 mg via ORAL

## 2016-10-22 MED ORDER — FENTANYL CITRATE (PF) 100 MCG/2ML IJ SOLN
25.0000 ug | INTRAMUSCULAR | Status: DC | PRN
  Administered 2016-10-22: 25 ug via INTRAVENOUS
  Administered 2016-10-22: 50 ug via INTRAVENOUS

## 2016-10-22 MED ORDER — FENTANYL CITRATE (PF) 100 MCG/2ML IJ SOLN
INTRAMUSCULAR | Status: AC
  Filled 2016-10-22: qty 2

## 2016-10-22 MED ORDER — CHLORHEXIDINE GLUCONATE CLOTH 2 % EX PADS: 6.0000 | MEDICATED_PAD | Freq: Once | CUTANEOUS | Status: DC

## 2016-10-22 MED ORDER — OXYCODONE HCL 5 MG/5ML PO SOLN: 5.0000 mg | Freq: Once | ORAL | Status: AC | PRN

## 2016-10-22 MED ORDER — FENTANYL CITRATE (PF) 100 MCG/2ML IJ SOLN
INTRAMUSCULAR | Status: DC | PRN
  Administered 2016-10-22: 100 ug via INTRAVENOUS

## 2016-10-22 MED ORDER — FENTANYL CITRATE (PF) 100 MCG/2ML IJ SOLN
50.0000 ug | INTRAMUSCULAR | Status: DC | PRN
  Administered 2016-10-22 (×2): 100 ug via INTRAVENOUS

## 2016-10-22 MED ORDER — LIDOCAINE HCL (CARDIAC) 20 MG/ML IV SOLN
INTRAVENOUS | Status: DC | PRN
  Administered 2016-10-22: 30 mg via INTRAVENOUS

## 2016-10-22 MED ORDER — LACTATED RINGERS IV SOLN: INTRAVENOUS | Status: DC

## 2016-10-22 MED ORDER — CEFAZOLIN SODIUM-DEXTROSE 2-3 GM-% IV SOLR
INTRAVENOUS | Status: DC | PRN
  Administered 2016-10-22: 2 g via INTRAVENOUS

## 2016-10-22 MED ORDER — MIDAZOLAM HCL 2 MG/2ML IJ SOLN
1.0000 mg | INTRAMUSCULAR | Status: DC | PRN
  Administered 2016-10-22: 2 mg via INTRAVENOUS

## 2016-10-22 MED ORDER — SODIUM CHLORIDE 0.9 % IR SOLN
Status: DC | PRN
  Administered 2016-10-22: 1000 mL

## 2016-10-22 SURGICAL SUPPLY — 58 items
BANDAGE ACE 3X5.8 VEL STRL LF (GAUZE/BANDAGES/DRESSINGS) ×2 IMPLANT
BANDAGE ACE 4X5 VEL STRL LF (GAUZE/BANDAGES/DRESSINGS) ×4 IMPLANT
BLADE SURG 15 STRL LF DISP TIS (BLADE) ×2 IMPLANT
BLADE SURG 15 STRL SS (BLADE) ×4
BNDG CMPR 9X4 STRL LF SNTH (GAUZE/BANDAGES/DRESSINGS) ×1
BNDG CONFORM 2 STRL LF (GAUZE/BANDAGES/DRESSINGS) ×2 IMPLANT
BNDG ESMARK 4X9 LF (GAUZE/BANDAGES/DRESSINGS) ×2 IMPLANT
BNDG GAUZE ELAST 4 BULKY (GAUZE/BANDAGES/DRESSINGS) ×2 IMPLANT
BUR CUDA 2.9 (BURR) IMPLANT
BUR FULL RADIUS 2.9 (BURR) ×1 IMPLANT
BUR GATOR 2.9 (BURR) IMPLANT
BUR SPHERICAL 2.9 (BURR) IMPLANT
COVER BACK TABLE 60X90IN (DRAPES) ×2 IMPLANT
CUFF TOURNIQUET SINGLE 18IN (TOURNIQUET CUFF) IMPLANT
CUFF TOURNIQUET SINGLE 34IN LL (TOURNIQUET CUFF) ×1 IMPLANT
DRAPE EXTREMITY T 121X128X90 (DRAPE) ×2 IMPLANT
DRAPE IMP U-DRAPE 54X76 (DRAPES) ×2 IMPLANT
DRAPE OEC MINIVIEW 54X84 (DRAPES) IMPLANT
DRAPE SURG 17X23 STRL (DRAPES) ×1 IMPLANT
DRAPE U-SHAPE 47X51 STRL (DRAPES) ×2 IMPLANT
DURAPREP 26ML APPLICATOR (WOUND CARE) ×2 IMPLANT
ELECT REM PT RETURN 9FT ADLT (ELECTROSURGICAL) ×2
ELECTRODE REM PT RTRN 9FT ADLT (ELECTROSURGICAL) ×1 IMPLANT
GAUZE SPONGE 4X4 12PLY STRL (GAUZE/BANDAGES/DRESSINGS) ×2 IMPLANT
GAUZE SPONGE 4X4 16PLY XRAY LF (GAUZE/BANDAGES/DRESSINGS) IMPLANT
GAUZE XEROFORM 1X8 LF (GAUZE/BANDAGES/DRESSINGS) ×2 IMPLANT
GLOVE BIO SURGEON STRL SZ7.5 (GLOVE) ×4 IMPLANT
GLOVE BIO SURGEON STRL SZ8 (GLOVE) IMPLANT
GLOVE BIOGEL PI IND STRL 8 (GLOVE) IMPLANT
GLOVE BIOGEL PI INDICATOR 8 (GLOVE)
GOWN STRL REUS W/ TWL LRG LVL3 (GOWN DISPOSABLE) ×1 IMPLANT
GOWN STRL REUS W/ TWL XL LVL3 (GOWN DISPOSABLE) ×1 IMPLANT
GOWN STRL REUS W/TWL LRG LVL3 (GOWN DISPOSABLE) ×2
GOWN STRL REUS W/TWL XL LVL3 (GOWN DISPOSABLE) ×2
MANIFOLD NEPTUNE II (INSTRUMENTS) ×2 IMPLANT
NDL HYPO 25X1 1.5 SAFETY (NEEDLE) ×1 IMPLANT
NDL SAFETY ECLIPSE 18X1.5 (NEEDLE) IMPLANT
NEEDLE HYPO 18GX1.5 SHARP (NEEDLE)
NEEDLE HYPO 25X1 1.5 SAFETY (NEEDLE) IMPLANT
NS IRRIG 1000ML POUR BTL (IV SOLUTION) IMPLANT
PACK BASIN DAY SURGERY FS (CUSTOM PROCEDURE TRAY) ×2 IMPLANT
PADDING CAST ABS 4INX4YD NS (CAST SUPPLIES) ×2
PADDING CAST ABS COTTON 4X4 ST (CAST SUPPLIES) ×2 IMPLANT
PENCIL BUTTON HOLSTER BLD 10FT (ELECTRODE) IMPLANT
SET IRRIG Y TYPE TUR BLADDER L (SET/KITS/TRAYS/PACK) ×2 IMPLANT
SLEEVE SCD COMPRESS KNEE MED (MISCELLANEOUS) ×2 IMPLANT
SPLINT FIBERGLASS 4X30 (CAST SUPPLIES) ×2 IMPLANT
STAPLER VISISTAT 35W (STAPLE) IMPLANT
STOCKINETTE 6  STRL (DRAPES) ×1
STOCKINETTE 6 STRL (DRAPES) ×1 IMPLANT
STRAP ANKLE FOOT DISTRACTOR (ORTHOPEDIC SUPPLIES) ×1 IMPLANT
SUT ETHILON 4 0 PS 2 18 (SUTURE) ×2 IMPLANT
SUT MNCRL AB 3-0 PS2 18 (SUTURE) ×1 IMPLANT
SUT MNCRL AB 4-0 PS2 18 (SUTURE) ×1 IMPLANT
SUT MON AB 5-0 PS2 18 (SUTURE) IMPLANT
SYR 10ML LL (SYRINGE) ×1 IMPLANT
SYR BULB 3OZ (MISCELLANEOUS) ×2 IMPLANT
UNDERPAD 30X30 (UNDERPADS AND DIAPERS) ×2 IMPLANT

## 2016-10-22 NOTE — Discharge Instructions (Signed)
CONTINUE ALL MEDICATIONS AS BEFORE SURGERY SEE WRITTEN INSTRUCTIONS     Regional Anesthesia Blocks  1. Numbness or the inability to move the "blocked" extremity may last from 3-48 hours after placement. The length of time depends on the medication injected and your individual response to the medication. If the numbness is not going away after 48 hours, call your surgeon.  2. The extremity that is blocked will need to be protected until the numbness is gone and the  Strength has returned. Because you cannot feel it, you will need to take extra care to avoid injury. Because it may be weak, you may have difficulty moving it or using it. You may not know what position it is in without looking at it while the block is in effect.  3. For blocks in the legs and feet, returning to weight bearing and walking needs to be done carefully. You will need to wait until the numbness is entirely gone and the strength has returned. You should be able to move your leg and foot normally before you try and bear weight or walk. You will need someone to be with you when you first try to ensure you do not fall and possibly risk injury.  4. Bruising and tenderness at the needle site are common side effects and will resolve in a few days.  5. Persistent numbness or new problems with movement should be communicated to the surgeon or the [facility] [phone])/ [facility] [phone]).       Post Anesthesia Home Care Instructions  Activity: Get plenty of rest for the remainder of the day. A responsible adult should stay with you for 24 hours following the procedure.  For the next 24 hours, DO NOT: -Drive a car -Advertising copywriterperate machinery -Drink alcoholic beverages -Take any medication unless instructed by your physician -Make any legal decisions or sign important papers.  Meals: Start with liquid foods such as gelatin or soup. Progress to regular foods as tolerated. Avoid greasy, spicy,  heavy foods. If nausea and/or vomiting occur, drink only clear liquids until the nausea and/or vomiting subsides. Call your physician if vomiting continues.  Special Instructions/Symptoms: Your throat may feel dry or sore from the anesthesia or the breathing tube placed in your throat during surgery. If this causes discomfort, gargle with warm salt water. The discomfort should disappear within 24 hours.  If you had a scopolamine patch placed behind your ear for the management of post- operative nausea and/or vomiting:  1. The medication in the patch is effective for 72 hours, after which it should be removed.  Wrap patch in a tissue and discard in the trash. Wash hands thoroughly with soap and water. 2. You may remove the patch earlier than 72 hours if you experience unpleasant side effects which may include dry mouth, dizziness or visual disturbances. 3. Avoid touching the patch. Wash your hands with soap and water after contact with the patch.

## 2016-10-22 NOTE — Anesthesia Procedure Notes (Signed)
Procedure Name: LMA Insertion Date/Time: 10/22/2016 1:14 PM Performed by: Genevieve NorlanderLINKA, Diane [name] Pre-anesthesia Checklist: Patient identified, Emergency Drugs available, Suction available, Patient being monitored and Timeout performed Patient Re-evaluated:Patient Re-evaluated prior to inductionOxygen Delivery Method: Circle system utilized Preoxygenation: Pre-oxygenation with 100% oxygen Intubation Type: IV induction Ventilation: Mask ventilation without difficulty LMA: LMA inserted LMA Size: 4.0 Number of attempts: 1 Airway Equipment and Method: Bite block Placement Confirmation: positive ETCO2 Tube secured with: Tape Dental Injury: Teeth and Oropharynx as per pre-operative assessment

## 2016-10-22 NOTE — Transfer of Care (Signed)
Immediate Anesthesia Transfer of Care Note  Patient: Diane [name]  Procedure(s) Performed: Procedure(s) with comments: ANKLE ARTHROSCOPY (Right) - GENERAL/REG BLOCK  Patient Location: PACU  Anesthesia Type:GA combined with regional for post-op pain  Level of Consciousness: awake and patient cooperative  Airway & Oxygen Therapy: Patient Spontanous Breathing and Patient connected to face mask oxygen  Post-op Assessment: Report given to RN and Post -op Vital signs reviewed and stable  Post vital signs: Reviewed and stable  Last Vitals:  Vitals:   10/22/16 1235 10/22/16 1446  BP: 107/67 125/76  Pulse: 89 88  Resp: (!) 25 15  Temp:  (P) 36.6 C    Last Pain:  Vitals:   10/22/16 1446  TempSrc:   PainSc: (P) 0-No pain         Complications: No apparent anesthesia complications

## 2016-10-22 NOTE — Progress Notes (Signed)
Assisted Dr. Hollis with right, ultrasound guided, popliteal, adductor canal block. Side rails up, monitors on throughout procedure. See vital signs in flow sheet. Tolerated Procedure well. 

## 2016-10-22 NOTE — Brief Op Note (Signed)
10/22/2016  2:54 PM  PATIENT:  Diane [name]  35 y.o. female  PRE-OPERATIVE DIAGNOSIS:  OSTEOARTHRITIS, OSTEOCHONDRAL LESIONS  POST-OPERATIVE DIAGNOSIS:  OSTEOARTHRITIS, OSTEOCHONDRAL LESIONS   PROCEDURE:  Procedure(s) with comments: ANKLE ARTHROSCOPY (Right) - GENERAL/REG BLOCK  SURGEON:  Surgeon(s) and Role:    * Vivi BarrackMatthew R Anayia Eugene, DPM - Primary  PHYSICIAN ASSISTANT:   ASSISTANTS: none   ANESTHESIA:   regional and general  EBL:  Total I/O In: 1500 [I.V.:1500] Out: 4 [Blood:4]  BLOOD ADMINISTERED:none  DRAINS: none   LOCAL MEDICATIONS USED:  NONE  SPECIMEN:  Source of Specimen:  ankle bone for pathology and microbiology  DISPOSITION OF SPECIMEN:  PATHOLOGY  COUNTS:  YES  TOURNIQUET:   Total Tourniquet Time Documented: Thigh (Right) - 66 minutes Total: Thigh (Right) - 66 minutes   DICTATION: .Reubin Milanragon Dictation  PLAN OF CARE: Discharge to home after PACU  PATIENT DISPOSITION:  PACU - hemodynamically stable.   Delay start of Pharmacological VTE agent (>24hrs) due to surgical blood loss or risk of bleeding: not applicable

## 2016-10-22 NOTE — Anesthesia Procedure Notes (Signed)
Anesthesia Regional Block:  Popliteal block  Pre-Anesthetic Checklist: ,, timeout performed, Correct Patient, Correct Site, Correct Laterality, Correct Procedure, Correct Position, site marked, Risks and benefits discussed,  Surgical consent,  Pre-op evaluation,  At surgeon's request and post-op pain management  Laterality: Right  Prep: chloraprep       Needles:  Injection technique: Single-shot  Needle Type: Echogenic Needle     Needle Length: 9cm 9 cm Needle Gauge: 21 and 21 G    Additional Needles:  Procedures: ultrasound guided (picture in chart) Popliteal block Narrative:  Start time: 10/22/2016 12:34 PM End time: 10/22/2016 12:38 PM Injection made incrementally with aspirations every 5 mL.  Performed by: Personally  Anesthesiologist: Shona SimpsonHOLLIS, Shannan Slinker D  Additional Notes: No immediate complications noted. Pt tolerated well.

## 2016-10-22 NOTE — Anesthesia Preprocedure Evaluation (Signed)
Anesthesia Evaluation  Patient identified by MRN, date of birth, ID band Patient awake    Reviewed: Allergy & Precautions, NPO status , Patient's Chart, lab work & pertinent test results  History of Anesthesia Complications (+) PONV and history of anesthetic complications  Airway Mallampati: II  TM Distance: >3 FB Neck ROM: Full    Dental  (+) Teeth Intact   Pulmonary COPD, Current Smoker,    breath sounds clear to auscultation       Cardiovascular negative cardio ROS   Rhythm:Regular     Neuro/Psych  Headaches, PSYCHIATRIC DISORDERS Anxiety Depression    GI/Hepatic Neg liver ROS, PUD, GERD  Medicated and Controlled,  Endo/Other    Renal/GU negative Renal ROS  negative genitourinary   Musculoskeletal  (+) Arthritis ,   Abdominal   Peds negative pediatric ROS (+)  Hematology negative hematology ROS (+)   Anesthesia Other Findings   Reproductive/Obstetrics negative OB ROS                             Anesthesia Physical  Anesthesia Plan  ASA: II  Anesthesia Plan: General   Post-op Pain Management: GA combined w/ Regional for post-op pain   Induction: Intravenous  Airway Management Planned: LMA  Additional Equipment: None  Intra-op Plan:   Post-operative Plan: Extubation in OR  Informed Consent: I have reviewed the patients History and Physical, chart, labs and discussed the procedure including the risks, benefits and alternatives for the proposed anesthesia with the patient or authorized representative who has indicated his/her understanding and acceptance.   Dental advisory given  Plan Discussed with: CRNA and Surgeon  Anesthesia Plan Comments:        Anesthesia Quick Evaluation

## 2016-10-22 NOTE — Anesthesia Procedure Notes (Signed)
Anesthesia Regional Block:  Adductor canal block  Pre-Anesthetic Checklist: ,, timeout performed, Correct Patient, Correct Site, Correct Laterality, Correct Procedure, Correct Position, site marked, Risks and benefits discussed,  Surgical consent,  Pre-op evaluation,  At surgeon's request and post-op pain management  Laterality: Right  Prep: chloraprep       Needles:  Injection technique: Single-shot  Needle Type: Echogenic Needle     Needle Length: 9cm 9 cm Needle Gauge: 21 and 21 G    Additional Needles:  Procedures: ultrasound guided (picture in chart) Adductor canal block Narrative:  Start time: 10/22/2016 12:38 PM End time: 10/22/2016 12:42 PM Injection made incrementally with aspirations every 5 mL.  Performed by: Personally  Anesthesiologist: Shona SimpsonHOLLIS, Alvin Diffee D  Additional Notes: No immediate complications noted. Pt tolerated well.

## 2016-10-23 ENCOUNTER — Encounter (HOSPITAL_BASED_OUTPATIENT_CLINIC_OR_DEPARTMENT_OTHER): Payer: Self-pay | Admitting: Podiatry

## 2016-10-24 NOTE — Anesthesia Postprocedure Evaluation (Signed)
Anesthesia Post Note  Patient: Diane [name]  Procedure(s) Performed: Procedure(s) (LRB): ANKLE ARTHROSCOPY (Right)  Patient location during evaluation: PACU Anesthesia Type: General and Regional Level of consciousness: awake and alert Pain management: pain level controlled Vital Signs Assessment: post-procedure vital signs reviewed and stable Respiratory status: spontaneous breathing, nonlabored ventilation, respiratory function stable and patient connected to nasal cannula oxygen Cardiovascular status: blood pressure returned to baseline and stable Postop Assessment: no signs of nausea or vomiting Anesthetic complications: no    Last Vitals:  Vitals:   10/22/16 1615 10/22/16 1635  BP: 122/81 122/70  Pulse: 88 85  Resp: 16 18  Temp:  36.6 C    Last Pain:  Vitals:   10/23/16 1104  TempSrc:   PainSc: 10-Worst pain ever                 Shelton SilvasKevin D Atalya Dano

## 2016-10-27 LAB — AEROBIC/ANAEROBIC CULTURE W GRAM STAIN (SURGICAL/DEEP WOUND)
Culture: NO GROWTH
Gram Stain: NONE SEEN

## 2016-10-27 LAB — AEROBIC/ANAEROBIC CULTURE (SURGICAL/DEEP WOUND)

## 2016-10-27 NOTE — Op Note (Signed)
PRE-OPERATIVE DIAGNOSIS:  OSTEOARTHRITIS, OSTEOCHONDRAL LESIONS  POST-OPERATIVE DIAGNOSIS:  OSTEOARTHRITIS, OSTEOCHONDRAL LESIONS   PROCEDURE:  Procedure(s) with comments: ANKLE ARTHROSCOPY WITH SYNOVECTOMY AND MICROFRACTURE (Right) - GENERAL/REG BLOCK BONE BIOPSY FIBULA  SURGEON:  Surgeon(s) and Role:    * Vivi BarrackMatthew R Jigar Zielke, DPM - Primary  PHYSICIAN ASSISTANT:   ASSISTANTS: none   ANESTHESIA:   regional and general  EBL:  Total I/O In: 1500 [I.V.:1500] Out: 4 [Blood:4]  BLOOD ADMINISTERED:none  DRAINS: none   LOCAL MEDICATIONS USED:  NONE  SPECIMEN:  Source of Specimen:  ankle bone for pathology and microbiology  DISPOSITION OF SPECIMEN:  PATHOLOGY  COUNTS:  YES  TOURNIQUET:   Total Tourniquet Time Documented: Thigh (Right) - 66 minutes Total: Thigh (Right) - 66 minutes   DICTATION: .Reubin Milanragon Dictation  PLAN OF CARE: Discharge to home after PACU  PATIENT DISPOSITION:  PACU - hemodynamically stable.   Delay start of Pharmacological VTE agent (>24hrs) due to surgical blood loss or risk of bleeding: not applicable  Indications for surgery: [name] presented to the office with concerns of right ankle pain and swelling which has been ongoing since she sustained a trimalleolar ankle fracture. She underwent open reduction internal fixation of that time and subsequent hardware removal. Since that she states that she's had continued significant pain to her ankle joint as well as swelling. She has had elevated sedimentation rates and C-reactive protein of her white count was normal. She's previously had a joint aspiration. She is also been on steroids which have not helped. I discussed with her possible chronic regional pain syndrome and she was referred to pain management however she is unable to see them until January. Due to the pain to her ankle I discussed with her ankle arthroscopy for both diagnostic and hopefully therapeutic purposes. I discussed  this is no guarantee of resolution of symptoms and that she is likely still would have pain after the surgery. She understands this and wishes to proceed. All alternatives, risks, complications were discussed with the patient in detail. No promises or guarantees are given as the outcome of the procedure and all questions were answered to the best of my ability.  Procedure in detail: The patient was both verbally and visually identified by myself, the nursing staff, and anesthesia staff. She was then transferred to the operative room. Stretcher and placed on the operating table in a supine position. After adequate plane of anesthesia was obtained a well-padded thigh tourniquet was then placed. The right leg was then placed in position in order to distract the ankle to with a thigh holder. The right lower extremity was then scrubbed prepped and draped in the normal sterile fashion. The right lower extremity was exsanguinated with an Esmarch bandage and the pneumatic tourniquet was inflated to 300 mmHg.  Next a total of 10 mL of saline was infiltrated into the ankle joint. A small incision was made on the anterior medial aspect of the ankle joint. A 15 blade scalpel was utilized to make the incision and blunt dissection was carried out with a hemostat to the ankle joint. Camera was introduced into the medial portal to identify the ankle joint. Initial survey of the joint was obtained which did reveal that there was moderate amount of synovitis as well as osteochondral lesions present on both the talar dome as well as the tibia. Next under visualization a lateral portal was made adjacent any neurovascular and tendinous structures. Incision was made with 15 blade scalpel to the epidermis and  dermis and blunt dissection was then carried down to penetrate the ankle joint. The shaver was introduced into the lateral incision. Next synovitis was then debrided. There Appeared to be moderate amount and both the posterior  as well as the anterior portion of the ankle joint. There was found to be osteochondral lesions of the tibia as well as talar dome. There was lesions on both the medial, central as well as the posterior portion of the talar dome as well as the anterior tibia. There is no loose fragments identified. These areas were then microfractured. There was no signs of infection during the procedure. The ankle joint was further debrided. The joint was then flushed with saline. All hardware is removed. The 2 incisions were then closed with 4-0 nylon.  I did elect to proceed with a bone biopsy. A small stab incision was made on the lateral aspect of the fibula with a 15 blade scalpel and then blunt dissection was carried down to the fibula. A trocar was a less remove the piece of bone this was sent for both pathology as well as microbiology. Incision was irrigated. The incision was closed with 4-0 nylon.  Xeroform is placed over the incisions followed by dry sterile dressing. The tourniquet was then released nurse and a median A refill time to all the digits. She is awoken from anesthesia and found to have tolerated the procedure well any complications. She was transferred to PACU via signs stable and vascular status intact.  I discussed the findings with the patient's mom and the patient gave me consent to do so prior to the surgery.

## 2016-10-28 ENCOUNTER — Encounter: Payer: Self-pay | Admitting: Podiatry

## 2016-10-28 ENCOUNTER — Ambulatory Visit (HOSPITAL_BASED_OUTPATIENT_CLINIC_OR_DEPARTMENT_OTHER)
Admission: RE | Admit: 2016-10-28 | Discharge: 2016-10-28 | Disposition: A | Discharge status: Home / Self Care | Payer: No Typology Code available for payment source | Source: Ambulatory Visit | Attending: Podiatry | Admitting: Podiatry

## 2016-10-28 ENCOUNTER — Ambulatory Visit (INDEPENDENT_AMBULATORY_CARE_PROVIDER_SITE_OTHER): Payer: PRIVATE HEALTH INSURANCE | Admitting: Podiatry

## 2016-10-28 DIAGNOSIS — M25571 Pain in right ankle and joints of right foot: Secondary | ICD-10-CM

## 2016-10-28 DIAGNOSIS — M949 Disorder of cartilage, unspecified: Secondary | ICD-10-CM

## 2016-10-28 DIAGNOSIS — M899 Disorder of bone, unspecified: Secondary | ICD-10-CM

## 2016-10-28 DIAGNOSIS — Z9889 Other specified postprocedural states: Secondary | ICD-10-CM

## 2016-10-28 MED ORDER — HYDROCODONE-ACETAMINOPHEN 5-325 MG PO TABS: 1.0000 | ORAL_TABLET | Freq: Four times a day (QID) | ORAL | 0 refills | Status: DC | PRN

## 2016-10-28 NOTE — Progress Notes (Addendum)
Subjective: Diane [name] is a 35 y.o. is seen today in office s/p right ankle arthroscopy, microfacture and bone biopsy preformed on 10/22/16. They state their pain is improving and she has decreased the amount of pain medication she has been taking. She is currently taking Percocet 1 pill 3 times a day and would like to decrease this. She has been NWB. She did fall on Friday in the snow. She was going up her front steps in the snow as opposed going up other steps her husband states are carpeted. Denies any systemic complaints such as fevers, chills, nausea, vomiting. No calf pain, chest pain, shortness of breath.   Objective: General: No acute distress, AAOx3; presents today with her husband.  DP/PT pulses palpable 2/4, CRT < 3 sec to all digits.  Protective sensation intact. Motor function intact.  Right ankle: Incision is well coapted without any evidence of dehiscence and sutures intact. There is no surrounding erythema, ascending cellulitis, fluctuance, crepitus, malodor, drainage/purulence. There is minimal edema around the surgical site. There is mild pain along the surgical site and she states it is getting better. Mild ecchymosis to her knee but no pain. She states she feels "sore" all over.   No other areas of tenderness to bilateral lower extremities.  No other open lesions or pre-ulcerative lesions.  No pain with calf compression, swelling, warmth, erythema.   Assessment and Plan:  Status post right ankle arthroscopy  -Treatment options discussed including all alternatives, risks, and complications -X-rays were obtained and reviewed with the patient. No evidence of acute fracture.  -Antibiotic ointment was applied over the incisions followed by a dry Dressing. Keep dressing clean, dry, intact. -Continue NWB -Ice/elevation -Pain medication as needed. Rx Vicodin today. She is almost out of percocet and we will try to decrease the dose. Hopefully after this she can go back to  tramadol.  -Follow up in 1 week. At that time, suture removal and likely transition to [facility] boot. Will also start PT in the next couple of weeks.  -If knee is not improving recommended to follow-up with orthopedics.  -Bone culture discussed with the patient.  -Monitor for any clinical signs or symptoms of infection and DVT/PE and directed to call the office immediately should any occur or go to the ER.  Ovid CurdMatthew Wagoner, DPM

## 2016-10-30 ENCOUNTER — Other Ambulatory Visit: Payer: Self-pay | Admitting: Family Medicine

## 2016-10-30 ENCOUNTER — Encounter: Payer: No Typology Code available for payment source | Admitting: Podiatry

## 2016-10-30 NOTE — Progress Notes (Signed)
DOS 12.06.2017 Right Ankle Arthroplasty (ankle scope), Microfracture Bone Biopsy

## 2016-11-03 ENCOUNTER — Encounter: Payer: Self-pay | Admitting: Podiatry

## 2016-11-03 ENCOUNTER — Ambulatory Visit (INDEPENDENT_AMBULATORY_CARE_PROVIDER_SITE_OTHER): Payer: Self-pay | Admitting: Podiatry

## 2016-11-03 VITALS — BP 110/79 | HR 106 | Resp 16

## 2016-11-03 DIAGNOSIS — Z9889 Other specified postprocedural states: Secondary | ICD-10-CM

## 2016-11-03 DIAGNOSIS — M19171 Post-traumatic osteoarthritis, right ankle and foot: Secondary | ICD-10-CM

## 2016-11-03 DIAGNOSIS — M792 Neuralgia and neuritis, unspecified: Secondary | ICD-10-CM

## 2016-11-03 MED ORDER — TRAMADOL HCL 50 MG PO TABS: 50.0000 mg | ORAL_TABLET | Freq: Three times a day (TID) | ORAL | 0 refills | Status: DC | PRN

## 2016-11-04 ENCOUNTER — Telehealth: Payer: Self-pay | Admitting: Family Medicine

## 2016-11-04 ENCOUNTER — Other Ambulatory Visit: Payer: Self-pay | Admitting: Emergency Medicine

## 2016-11-04 ENCOUNTER — Telehealth: Payer: Self-pay | Admitting: Emergency Medicine

## 2016-11-04 ENCOUNTER — Ambulatory Visit: Payer: PRIVATE HEALTH INSURANCE | Admitting: Podiatry

## 2016-11-04 MED ORDER — NONFORMULARY OR COMPOUNDED ITEM: 2 refills | Status: DC

## 2016-11-04 NOTE — Telephone Encounter (Signed)
Call in #60 with 5 rf 

## 2016-11-04 NOTE — Telephone Encounter (Signed)
Is it okay to refill Diazepam 5MG , QTY 60.   [facility]

## 2016-11-04 NOTE — Telephone Encounter (Signed)
Pt state that she has misplaced the following and need to see if you could refill the following venlafaxine XR, oxybutynin.   Pharm:  [facility] [place]

## 2016-11-04 NOTE — Progress Notes (Signed)
Subjective: Diane [name] is a 35 y.o. is seen today in office s/p right ankle arthroscopy, microfacture and bone biopsy preformed on 10/22/16. She presents a states she is having pain to her right foot. She is also having some sharp pains to her foot as well which just started over the last day. She states that she had some nerve damage to her ankle after the injury in the last 2 surgeries to her ankle. She states it has intensified over the last day. She also states that she is remaining nonweightbearing in the cam boot using the knee scooter when she tries the way to her foot she's still having quite a bit of pain. She currently takes gabapentin twice a day. She did complete the Vicodin. She has been taking tramadol. She denies any recent falls after the previous fall. Denies any systemic complaints such as fevers, chills, nausea, vomiting. No calf pain, chest pain, shortness of breath.   Objective: General: No acute distress, AAOx3; presents today with her husband.  DP/PT pulses palpable 2/4, CRT < 3 sec to all digits.  Protective sensation intact. Motor function intact.  Right ankle: Incision is well coapted without any evidence of dehiscence and sutures intact. There is no surrounding erythema, ascending cellulitis, fluctuance, crepitus, malodor, drainage/purulence. There is minimal edema around the surgical site. Overall her swelling actually appears to be doing well. She has showed the pictures previously the swelling to her foot/ankle and her swelling is not as significant as what it was and this pictures. There is mild pain along the surgical site and she states it is getting better. She also gets some pain/sharp pain to the dorsal aspect of her foot however this does not appear to be localized to one nerve distribution. No other areas of tenderness to bilateral lower extremities.  No other open lesions or pre-ulcerative lesions.  No pain with calf compression, swelling, warmth, erythema.    Assessment and Plan:  Status post right ankle arthroscopy; presents with pain and neuritis symptoms.  -Treatment options discussed including all alternatives, risks, and complications -Sutures removed today. Into buttock ointment was applied followed by dressing. She can start to shower tomorrow. -Order compound cream for neuropathy -Discussed increase in gabapentin to 3 times a day. -Refilled tramadol. Hopefully we can stay off of either doses of narcotics. She has an upcoming pain management appointment in January. -Continue nonweightbearing for now. Over the next 2 weeks she can start to transition as tolerated to weightbearing as tolerated. -Ice elevation -Follow-up as scheduled or sooner if needed. Call any questions or concerns meantime.  *X-ray next appointment. Possible physical therapy next appointment as well.  Ovid CurdMatthew Wagoner, DPM

## 2016-11-04 NOTE — Telephone Encounter (Signed)
Pt need new Rx for tramadol and diazepam   [facility] [place]  Surgeon suggest to the patient that take 5 gabapentin a day 2 in the a.m. And 3 in the afternoon pt would like to know if you would approve that and if so refill accordingly.

## 2016-11-05 ENCOUNTER — Telehealth: Payer: Self-pay | Admitting: Family Medicine

## 2016-11-05 MED ORDER — DIAZEPAM 5 MG PO TABS: 5.0000 mg | ORAL_TABLET | Freq: Two times a day (BID) | ORAL | 5 refills | Status: DC | PRN

## 2016-11-05 MED ORDER — DIAZEPAM 5 MG PO TABS: 5.0000 mg | ORAL_TABLET | Freq: Two times a day (BID) | ORAL | 2 refills | Status: DC | PRN

## 2016-11-05 NOTE — Telephone Encounter (Signed)
Spoke with patient. Patient has a printed rx for tramadol from [facility].  Does not need another prescription at this time.

## 2016-11-05 NOTE — Telephone Encounter (Signed)
Called to the pharmacy and given verbally 

## 2016-11-05 NOTE — Addendum Note (Signed)
Addended by: Raj JanusADKINS, Irmalee Riemenschneider T on: 11/05/2016 09:40 AM   Modules accepted: Orders

## 2016-11-05 NOTE — Telephone Encounter (Signed)
° °  Pt said she found the other meds     Pt request refill of the following:  traMADol (ULTRAM) 50 MG tablet     Phamacy:   [facility]

## 2016-11-05 NOTE — Telephone Encounter (Signed)
Call in Oxybutynin #30 with 11 rf, Venlafaxine #30 with 11 rf, also Gabapentin 300 mg 2 tabs TID, #180 with 5 rf

## 2016-11-06 ENCOUNTER — Telehealth: Payer: Self-pay | Admitting: Emergency Medicine

## 2016-11-06 MED ORDER — OXYBUTYNIN CHLORIDE ER 10 MG PO TB24
10.0000 mg | ORAL_TABLET | Freq: Every day | ORAL | 11 refills | Status: DC
Start: 2016-11-06 — End: 2016-12-29

## 2016-11-06 MED ORDER — HYDROCODONE-ACETAMINOPHEN 5-325 MG PO TABS: ORAL_TABLET | ORAL | 0 refills | Status: DC

## 2016-11-06 MED ORDER — VENLAFAXINE HCL ER 150 MG PO CP24: 300.0000 mg | ORAL_CAPSULE | Freq: Every day | ORAL | 11 refills | Status: DC

## 2016-11-06 MED ORDER — GABAPENTIN 300 MG PO CAPS: 600.0000 mg | ORAL_CAPSULE | Freq: Three times a day (TID) | ORAL | 5 refills | Status: DC

## 2016-11-06 NOTE — Telephone Encounter (Signed)
[facility]

## 2016-11-06 NOTE — Telephone Encounter (Signed)
NO, this needs to come from Dr. Ardelle AntonWagoner

## 2016-11-06 NOTE — Telephone Encounter (Signed)
Left detailed message on personal voicemail, Dr.Fry said No to refill on Tramadol need to contact Dr.Wagoner's office for refill. Any questions call office.

## 2016-11-06 NOTE — Telephone Encounter (Signed)
Pt would like a refill for Tramadol 50 mg qty 240  [facility]

## 2016-11-07 ENCOUNTER — Other Ambulatory Visit: Payer: Self-pay

## 2016-11-07 ENCOUNTER — Other Ambulatory Visit: Payer: Self-pay | Admitting: Emergency Medicine

## 2016-11-07 MED ORDER — DICYCLOMINE HCL 10 MG PO CAPS: 10.0000 mg | ORAL_CAPSULE | Freq: Three times a day (TID) | ORAL | 0 refills | Status: DC

## 2016-11-11 ENCOUNTER — Ambulatory Visit (INDEPENDENT_AMBULATORY_CARE_PROVIDER_SITE_OTHER): Payer: PRIVATE HEALTH INSURANCE | Admitting: Podiatry

## 2016-11-11 ENCOUNTER — Encounter: Payer: Self-pay | Admitting: Podiatry

## 2016-11-11 ENCOUNTER — Ambulatory Visit (INDEPENDENT_AMBULATORY_CARE_PROVIDER_SITE_OTHER): Payer: PRIVATE HEALTH INSURANCE

## 2016-11-11 DIAGNOSIS — M25571 Pain in right ankle and joints of right foot: Secondary | ICD-10-CM | POA: Diagnosis not present

## 2016-11-11 DIAGNOSIS — M779 Enthesopathy, unspecified: Secondary | ICD-10-CM

## 2016-11-11 MED ORDER — TRAMADOL HCL 50 MG PO TABS: 50.0000 mg | ORAL_TABLET | Freq: Two times a day (BID) | ORAL | 0 refills | Status: DC | PRN

## 2016-11-12 ENCOUNTER — Encounter: Payer: Self-pay | Admitting: Family Medicine

## 2016-11-12 ENCOUNTER — Ambulatory Visit (INDEPENDENT_AMBULATORY_CARE_PROVIDER_SITE_OTHER): Payer: PRIVATE HEALTH INSURANCE | Admitting: Family Medicine

## 2016-11-12 VITALS — BP 118/84 | HR 102 | Temp 98.9°F | Ht 67.0 in | Wt 221.4 lb

## 2016-11-12 DIAGNOSIS — T380X5A Adverse effect of glucocorticoids and synthetic analogues, initial encounter: Secondary | ICD-10-CM | POA: Diagnosis not present

## 2016-11-12 DIAGNOSIS — E870 Hyperosmolality and hypernatremia: Secondary | ICD-10-CM | POA: Diagnosis not present

## 2016-11-12 DIAGNOSIS — Z8639 Personal history of other endocrine, nutritional and metabolic disease: Secondary | ICD-10-CM

## 2016-11-12 NOTE — Progress Notes (Signed)
Pre visit review using our clinic review tool, if applicable. No additional management support is needed unless otherwise documented below in the visit note. 

## 2016-11-12 NOTE — Telephone Encounter (Signed)
ERROR

## 2016-11-12 NOTE — Progress Notes (Signed)
Chief Complaint  Patient presents with  . fluid retention    Pt went to [facility] was told glucose and sodium was high and has been retaining and has noticed it prior to surgery     Subjective: Patient is a 35 y.o. female here for swelling.  Feels like swelling is everywhere, started around 3 weeks ago. Did have recent surgery on her R ankle, but this was noticed prior to then. Feels like it has improved from yesterday. She did have an increase in her gabapentin around 1 week ago (takes for migraines). She did take a course of steroids around the time this started. Denies hx of cardiac, renal or hepatic impairment. Does endorse weight gain. Unsure how much.  ROS: Skin: Swelling as noted above. Lungs: Denies SOB   Family History  Problem Relation Age of Onset  . Brain cancer Father   . Anesthesia problems Father     hard to wake up post-op  . Lung cancer Father   . Breast cancer Maternal Grandmother   . Ovarian cancer Maternal Grandmother   . Diabetes Maternal Grandmother   . Heart disease Maternal Grandmother   . Irritable bowel syndrome Mother   . Prostate cancer Maternal Grandfather   . Melanoma Maternal Grandfather    Past Medical History:  Diagnosis Date  . ADD (attention deficit disorder with hyperactivity)    no current med.  . Anemia    takes iron supplement  . Anxiety   . Cough 10/16/2016  . Depression   . Family history of adverse reaction to anesthesia    pt's father has hx. of being hard to wake up post-op  . GERD (gastroesophageal reflux disease)   . History of gastric ulcer   . History of kidney stones   . IBS (irritable bowel syndrome)   . Migraines   . Osteoarthritis    right ankle  . Osteochondral lesion 09/2016   right ankle  . Plantar fasciitis of left foot   . PONV (postoperative nausea and vomiting)   . Pseudotumor cerebri   . Wears partial dentures    upper   Allergies  Allergen Reactions  . Nsaids Other (See Comments)    DUE TO HISTORY OF  GASTRIC ULCER  . Nickel     "any jewelry that's not real"   . Adhesive [Tape] Rash  . Metoclopramide Hcl Other (See Comments)    RESTLESSNESS/JITTERY  . Prochlorperazine Edisylate Other (See Comments)    RESTLESSNESS/JITTERY     Current Outpatient Prescriptions:  .  albuterol (PROVENTIL HFA;VENTOLIN HFA) 108 (90 Base) MCG/ACT inhaler, Inhale 2 puffs into the lungs every 4 (four) hours as needed for wheezing or shortness of breath., Disp: 1 Inhaler, Rfl: 11 .  azelastine (ASTELIN) 0.1 % nasal spray, Place 1 spray into both nostrils 2 (two) times daily. Use in each nostril as directed, Disp: 30 mL, Rfl: 12 .  diazepam (VALIUM) 5 MG tablet, Take 1 tablet (5 mg total) by mouth every 12 (twelve) hours as needed for anxiety., Disp: 60 tablet, Rfl: 5 .  dicyclomine (BENTYL) 10 MG capsule, Take 1 capsule (10 mg total) by mouth 3 (three) times daily before meals., Disp: 90 capsule, Rfl: 0 .  diphenoxylate-atropine (LOMOTIL) 2.5-0.025 MG per tablet, Take 1 tablet by mouth 4 (four) times daily as needed for diarrhea or loose stools., Disp: 60 tablet, Rfl: 2 .  Fe-Succ Ac-C-Thre Ac-B12-FA (FERREX 150 FORTE PLUS) 50-100 MG CAPS, Take 150 mg by mouth 2 (two) times daily.,  Disp: 180 each, Rfl: 3 .  fluticasone (FLONASE) 50 MCG/ACT nasal spray, Place 1 spray into both nostrils daily., Disp: 16 g, Rfl: 5 .  gabapentin (NEURONTIN) 300 MG capsule, Take 2 capsules (600 mg total) by mouth 3 (three) times daily., Disp: 180 capsule, Rfl: 5 .  HYDROcodone-acetaminophen (NORCO/VICODIN) 5-325 MG tablet, Take 1 tablet by mouth every 6 (six) hours as needed., Disp: 20 tablet, Rfl: 0 .  HYDROcodone-acetaminophen (NORCO/VICODIN) 5-325 MG tablet, One tablet every 8 hours prn severe foot pain., Disp: 10 tablet, Rfl: 0 .  Magnesium 400 MG TABS, Take 1,200 mg by mouth daily. , Disp: , Rfl:  .  NONFORMULARY OR COMPOUNDED ITEM, [facility]:  Peripheral Neuropathy Cream - Bupivacaine 1%, Doxepin 3%, Gabapentin 6%,  Pentoxifylline 3%, Topiramate 1%, + Diclofenac 3%, apply 1-2 grams to affected area 3-4 times daily., Disp: 120 each, Rfl: 2 .  oxybutynin (DITROPAN-XL) 10 MG 24 hr tablet, Take 1 tablet (10 mg total) by mouth at bedtime., Disp: 30 tablet, Rfl: 11 .  promethazine (PHENERGAN) 25 MG tablet, Take 1 tablet (25 mg total) by mouth every 8 (eight) hours as needed for nausea or vomiting., Disp: 30 tablet, Rfl: 0 .  ranitidine (ZANTAC) 150 MG capsule, Take 300 mg by mouth 2 (two) times daily. , Disp: , Rfl:  .  tiZANidine (ZANAFLEX) 4 MG tablet, Take 4 tablets by mouth at bedtime as needed (migraines). , Disp: , Rfl: 1 .  traMADol (ULTRAM) 50 MG tablet, Take 1 tablet (50 mg total) by mouth every 12 (twelve) hours as needed., Disp: 30 tablet, Rfl: 0 .  venlafaxine XR (EFFEXOR-XR) 150 MG 24 hr capsule, Take 2 capsules (300 mg total) by mouth daily before breakfast., Disp: 60 capsule, Rfl: 11 .  vitamin B-12 (CYANOCOBALAMIN) 1000 MCG tablet, Take 1,000 mcg by mouth daily., Disp: , Rfl:  .  cephALEXin (KEFLEX) 500 MG capsule, Take 1 capsule (500 mg total) by mouth 3 (three) times daily. (Patient not taking: Reported on 11/12/2016), Disp: 30 capsule, Rfl: 2  Objective: BP 118/84 (BP Location: Right Arm, Patient Position: Sitting, Cuff Size: Large)   Pulse (!) 102   Temp 98.9 F (37.2 C) (Oral)   Ht 5\' 7"  (1.702 m)   Wt 221 lb 6.4 oz (100.4 kg)   LMP 10/19/2016 (Exact Date)   SpO2 97%   BMI 34.68 kg/m  General: Awake, appears stated age HEENT: MMM, no swelling of the uvula, lips, or tongue Heart: RRR, no murmurs, perhaps some trace tibial pitting edema over the distal third of the tibia bilaterally, I do not appreciate any edema in the upper extremities. Lungs: CTAB, no rales, wheezes or rhonchi. No accessory muscle use MSK: +calf tenderness b/l, also has tenderness b/l over pretibial region. Neg Homan's b/l.  Psych: Age appropriate judgment and insight, normal affect and mood  Assessment and  Plan: Adverse effect of corticosteroids, initial encounter  History of hyperglycemia - Plan: HgB A1c  Hypernatremia - Plan: Basic Metabolic Panel (BMET)  Orders as above. F/u on abn labs in ER on behalf of [name].  Given tempo of issue and timing of steroids, I believe this is related to her steroid course. She has not gained a significant amount of weight (3 lbs since 12/6) and her PMHx and history over the past mo is not concerning for organ failure contributing. Gabapentin could also be contributing, however feel the steroids are most likely culprit given the timing. I do not feel that she has a DVT  given age and bilateral presentation. Her urgent care notes stated she was having LLE swelling so I called her. She reaffirms that she told them the same thing she told us that she was having it everywhere.  Follow-up as needed. The patient voiced understanding and agreement to the plan.  Jilda Rocheicholas Paul BensonWendling, [place]DO 11/12/16  3:33 PM

## 2016-11-13 ENCOUNTER — Telehealth: Payer: Self-pay | Admitting: Family Medicine

## 2016-11-13 ENCOUNTER — Ambulatory Visit (INDEPENDENT_AMBULATORY_CARE_PROVIDER_SITE_OTHER): Payer: PRIVATE HEALTH INSURANCE | Admitting: Family Medicine

## 2016-11-13 ENCOUNTER — Encounter: Payer: Self-pay | Admitting: Family Medicine

## 2016-11-13 ENCOUNTER — Ambulatory Visit (HOSPITAL_BASED_OUTPATIENT_CLINIC_OR_DEPARTMENT_OTHER)
Admission: RE | Admit: 2016-11-13 | Discharge: 2016-11-13 | Disposition: A | Discharge status: Home / Self Care | Payer: No Typology Code available for payment source | Source: Ambulatory Visit | Attending: Family Medicine | Admitting: Family Medicine

## 2016-11-13 VITALS — BP 123/82 | HR 102 | Temp 99.0°F | Ht 67.0 in | Wt 222.0 lb

## 2016-11-13 DIAGNOSIS — M79661 Pain in right lower leg: Secondary | ICD-10-CM | POA: Insufficient documentation

## 2016-11-13 DIAGNOSIS — Z9889 Other specified postprocedural states: Secondary | ICD-10-CM | POA: Insufficient documentation

## 2016-11-13 LAB — BASIC METABOLIC PANEL
BUN: 13 mg/dL (ref 6–23)
CO2: 27 mEq/L (ref 19–32)
Calcium: 9.2 mg/dL (ref 8.4–10.5)
Chloride: 104 mEq/L (ref 96–112)
Creatinine, Ser: 0.74 mg/dL (ref 0.40–1.20)
GFR: 95.36 mL/min (ref >60.00)
GLUCOSE: 99 mg/dL (ref 70–99)
POTASSIUM: 3.6 meq/L (ref 3.5–5.1)
SODIUM: 139 meq/L (ref 135–145)

## 2016-11-13 LAB — HEMOGLOBIN A1C: HEMOGLOBIN A1C: 5.6 % (ref 4.6–6.5)

## 2016-11-13 NOTE — Telephone Encounter (Signed)
Pt says that she would like to switch providers. Pt says that seeing Dr. [name] is more convenient for her. Is this switch okay?

## 2016-11-13 NOTE — Progress Notes (Signed)
Subjective: Diane Robertsshley S [name] is a 35 y.o. is seen today in office s/p right ankle arthroscopy, microfacture and bone biopsy preformed on 10/22/16. She states that ankle is doing better but she points to the outside aspect of the right foot along the sinus tarsi which is the majority of pain. She has remained in a cam boot and has been nonweightbearing but she's been starting to put weight to her foot although somewhat painful to do so. Again the majority pain is the outside part of the foot. She denies any recent injury or trauma since last appointment. She is taking tramadol for pain. She is asking for refill today. Her mom is accompanied her today. Denies any systemic complaints such as fevers, chills, nausea, vomiting. No calf pain, chest pain, shortness of breath.   Objective: General: No acute distress, AAOx3; presents today with her husband.  DP/PT pulses palpable 2/4, CRT < 3 sec to all digits.  Protective sensation intact. Motor function intact.  Right ankle: Incision is well coapted without any evidence of dehiscence and a scar is formed. Minimal discomfort along the incisions to the ankle. The majority tenderness appears below plus lateral aspect of the long sinus tarsi With subtalar joint range of motion. There is mild edema to the foot but again overall appears to be improved compared to the pictures she should be previously. There is no erythema or increase in warmth. There is no other areas of tenderness bilaterally. The numbness/sharp pain that she is getting is improving as well. No other areas of tenderness to bilateral lower extremities.  No other open lesions or pre-ulcerative lesions.  No pain with calf compression, swelling, warmth, erythema.   Assessment and Plan:  Status post right ankle arthroscopy; presents with pain and neuritis symptoms; subtalar joint capsulitis right foot   -Treatment options discussed including all alternatives, risks, and complications -X-rays were again  reviewed and obtained today. There is no evidence of acute fracture identified. -I discussed likely capsulitis in the subtalar joint versus old fracture. Given the pain is. Discussed with her steroid injection understanding risks and complications. She wishes to proceed. Under sterile conditions a mixture of dexamethasone and local anesthetic was infiltrated without complications. Post injection care was discussed. -Continue cam boot for now. -Ice and elevation -Refill tramadol. Continue gabapentin as well. -Will start physical therapy over the next week. Prescription was provided today for this. -Follow-up as scheduled or sooner if needed.  Ovid CurdMatthew Cherine Drumgoole, DPM

## 2016-11-13 NOTE — Telephone Encounter (Signed)
Patient Name: Diane [name] DOB: 11/21/1980 Initial Comment Caller was seen yesterday, missed call from the MD asking if 1 or both calves are hurting, and it is just 1 with shooting pains up leg. Nurse Assessment Nurse: [name], RN, [name] Date/Time (Eastern Time): 11/13/2016 11:06:33 AM Confirm and document reason for call. If symptomatic, describe symptoms. ---Caller states she had surgery on her right ankle 3 weeks ago. She has been having edema all over her body. She saw the doctor yesterday and told the edema all seems to be related to the dose of steroids she took prior to the surgery. He is also testing her for diabetes. Today she has having pain in her right calf that shoots up the back of her leg. She denies any new redness or swelling. Does the patient have any new or worsening symptoms? ---Yes Will a triage be completed? ---Yes Related visit to physician within the last 2 weeks? ---Yes Does the PT have any chronic conditions? (i.e. diabetes, asthma, etc.) ---Yes List chronic conditions. ---Migraines, Anxiety/depression Is the patient pregnant or possibly pregnant? (Ask all females between the ages of 8512-55) ---No Is this a behavioral health or substance abuse call? ---No Guidelines Guideline Title Affirmed Question Affirmed Notes Leg Pain [1] Thigh or calf pain AND [2] only 1 side AND [3] present > 1 hour Final Disposition User See Physician within 4 Hours (or PCP triage) [name], RN, [name] Comments Appt scheduled with Dr. [name] today at 4pm. Pt states she was not having pain in her legs yesterday but today is having shoot pain that she rates as 6 out of 10 in her right calf. Referrals REFERRED TO PCP OFFICE Disagree/Comply: Comply

## 2016-11-13 NOTE — Progress Notes (Signed)
Pre visit review using our clinic tool,if applicable. No additional management support is needed unless otherwise documented below in the visit note.  

## 2016-11-13 NOTE — Telephone Encounter (Signed)
Relation to BM:WUXLpt:self Call back number: [phone]    Reason for call:  Patient states she missed Dr. [name] follow up call yesterday, and would like to inform PCP currently experiencing right calf pain shooting up her right leg, [facility]

## 2016-11-13 NOTE — Progress Notes (Signed)
Chief Complaint  Patient presents with  . Leg Pain    Right calf pain    Subjective: Patient is a 35 y.o. female here for right calf pain.  The patient noticed right calf pain radiating up her leg starting this morning. She is status post right ankle arthroscopy from 10/22/16. She has been less mobile than usual. She did not receive any blood thinners perioperatively. She denies any injury or history of blood clots. She's not having any current shortness of breath or chest pain.  ROS: MSK: +R calf pain Lungs: Denies SOB or cough  Family History  Problem Relation Age of Onset  . Brain cancer Father   . Anesthesia problems Father     hard to wake up post-op  . Lung cancer Father   . Breast cancer Maternal Grandmother   . Ovarian cancer Maternal Grandmother   . Diabetes Maternal Grandmother   . Heart disease Maternal Grandmother   . Irritable bowel syndrome Mother   . Prostate cancer Maternal Grandfather   . Melanoma Maternal Grandfather    Past Medical History:  Diagnosis Date  . ADD (attention deficit disorder with hyperactivity)    no current med.  . Anemia    takes iron supplement  . Anxiety   . Cough 10/16/2016  . Depression   . Family history of adverse reaction to anesthesia    pt's father has hx. of being hard to wake up post-op  . GERD (gastroesophageal reflux disease)   . History of gastric ulcer   . History of kidney stones   . IBS (irritable bowel syndrome)   . Migraines   . Osteoarthritis    right ankle  . Osteochondral lesion 09/2016   right ankle  . Plantar fasciitis of left foot   . PONV (postoperative nausea and vomiting)   . Pseudotumor cerebri   . Wears partial dentures    upper   Allergies  Allergen Reactions  . Nsaids Other (See Comments)    DUE TO HISTORY OF GASTRIC ULCER  . Nickel     "any jewelry that's not real"   . Adhesive [Tape] Rash  . Metoclopramide Hcl Other (See Comments)    RESTLESSNESS/JITTERY  . Prochlorperazine Edisylate  Other (See Comments)    RESTLESSNESS/JITTERY     Current Outpatient Prescriptions:  .  albuterol (PROVENTIL HFA;VENTOLIN HFA) 108 (90 Base) MCG/ACT inhaler, Inhale 2 puffs into the lungs every 4 (four) hours as needed for wheezing or shortness of breath., Disp: 1 Inhaler, Rfl: 11 .  azelastine (ASTELIN) 0.1 % nasal spray, Place 1 spray into both nostrils 2 (two) times daily. Use in each nostril as directed, Disp: 30 mL, Rfl: 12 .  diazepam (VALIUM) 5 MG tablet, Take 1 tablet (5 mg total) by mouth every 12 (twelve) hours as needed for anxiety., Disp: 60 tablet, Rfl: 5 .  dicyclomine (BENTYL) 10 MG capsule, Take 1 capsule (10 mg total) by mouth 3 (three) times daily before meals., Disp: 90 capsule, Rfl: 0 .  diphenoxylate-atropine (LOMOTIL) 2.5-0.025 MG per tablet, Take 1 tablet by mouth 4 (four) times daily as needed for diarrhea or loose stools., Disp: 60 tablet, Rfl: 2 .  Fe-Succ Ac-C-Thre Ac-B12-FA (FERREX 150 FORTE PLUS) 50-100 MG CAPS, Take 150 mg by mouth 2 (two) times daily., Disp: 180 each, Rfl: 3 .  fluticasone (FLONASE) 50 MCG/ACT nasal spray, Place 1 spray into both nostrils daily., Disp: 16 g, Rfl: 5 .  gabapentin (NEURONTIN) 300 MG capsule, Take 2 capsules (600  mg total) by mouth 3 (three) times daily., Disp: 180 capsule, Rfl: 5 .  Magnesium 400 MG TABS, Take 1,200 mg by mouth daily. , Disp: , Rfl:  .  NONFORMULARY OR COMPOUNDED ITEM, [facility]:  Peripheral Neuropathy Cream - Bupivacaine 1%, Doxepin 3%, Gabapentin 6%, Pentoxifylline 3%, Topiramate 1%, + Diclofenac 3%, apply 1-2 grams to affected area 3-4 times daily., Disp: 120 each, Rfl: 2 .  oxybutynin (DITROPAN-XL) 10 MG 24 hr tablet, Take 1 tablet (10 mg total) by mouth at bedtime., Disp: 30 tablet, Rfl: 11 .  promethazine (PHENERGAN) 25 MG tablet, Take 1 tablet (25 mg total) by mouth every 8 (eight) hours as needed for nausea or vomiting., Disp: 30 tablet, Rfl: 0 .  ranitidine (ZANTAC) 150 MG capsule, Take 300 mg by mouth 2  (two) times daily. , Disp: , Rfl:  .  traMADol (ULTRAM) 50 MG tablet, Take 1 tablet (50 mg total) by mouth every 12 (twelve) hours as needed., Disp: 30 tablet, Rfl: 0 .  venlafaxine XR (EFFEXOR-XR) 150 MG 24 hr capsule, Take 2 capsules (300 mg total) by mouth daily before breakfast., Disp: 60 capsule, Rfl: 11 .  vitamin B-12 (CYANOCOBALAMIN) 1000 MCG tablet, Take 1,000 mcg by mouth daily., Disp: , Rfl:   Objective: BP 123/82   Pulse (!) 102   Temp 99 F (37.2 C) (Oral)   Ht 5\' 7"  (1.702 m)   Wt 222 lb (100.7 kg)   LMP 10/19/2016   SpO2 98%   BMI 34.77 kg/m  General: Awake, appears stated age Heart: RRR, no tachycardia when I listened, no murmurs Lungs: CTAB, no rales, wheezes or rhonchi. No accessory muscle use MSK: +Homan's sign on R, +TTP over medial R gastroc, I do not appreciate any ropey texture Psych: Age appropriate judgment and insight, normal affect and mood  Assessment and Plan: Right calf pain - Plan: US Venous Img Lower Unilateral Right  Orders as above. She does have risk factors for DVT, will make sure she is not having one. My suspicion for a pulmonary embolus him is low given her oxygen saturation and lack of pulmonary symptoms. If positive, will treat with Xarelto for 3 mo. If neg, will treat as a musculoskeletal issue. F/u pending the above. The patient voiced understanding and agreement to the plan.  Jilda Rocheicholas Paul HallsWendling, DO 11/13/16  4:22 PM

## 2016-11-13 NOTE — Patient Instructions (Addendum)
Xarelto instructions: Week 1: take one 15 mg tab twice daily with food Week 2: take one 15 mg tab twice daily with food Week 3: take one 15 mg tab twice daily with food Week 4: take 1 20 mg tab once daily with food   Deep Vein Thrombosis A deep vein thrombosis (DVT) is a blood clot (thrombus) that usually occurs in a deep, larger vein of the lower leg or the pelvis, or in an upper extremity such as the arm. These are dangerous and can lead to serious and even life-threatening complications if the clot travels to the lungs. A DVT can damage the valves in your leg veins so that instead of flowing upward, the blood pools in the lower leg. This is called post-thrombotic syndrome, and it can result in pain, swelling, discoloration, and sores on the leg. What are the causes? A DVT is caused by the formation of a blood clot in your leg, pelvis, or arm. Usually, several things contribute to the formation of blood clots. A clot may develop when:  Your blood flow slows down.  Your vein becomes damaged in some way.  You have a condition that makes your blood clot more easily. What increases the risk? A DVT is more likely to develop in:  People who are older, especially over 34 years of age.  People who are overweight (obese).  People who sit or lie still for a long time, such as during long-distance travel (over 4 hours), bed rest, hospitalization, or during recovery from certain medical conditions like a stroke.  People who do not engage in much physical activity (sedentary lifestyle).  People who have chronic breathing disorders.  People who have a personal or family history of blood clots or blood clotting disease.  People who have peripheral vascular disease (PVD), diabetes, or some types of cancer.  People who have heart disease, especially if the person had a recent heart attack or has congestive heart failure.  People who have neurological diseases that affect the legs (leg  paresis).  People who have had a traumatic injury, such as breaking a hip or leg.  People who have recently had major or lengthy surgery, especially on the hip, knee, or abdomen.  People who have had a central line placed inside a large vein.  People who take medicines that contain the hormone estrogen. These include birth control pills and hormone replacement therapy.  Pregnancy or during childbirth or the postpartum period.  Long plane flights (over 8 hours). What are the signs or symptoms?   Symptoms of a DVT can include:  Swelling of your leg or arm, especially if one side is much worse.  Warmth and redness of your leg or arm, especially if one side is much worse.  Pain in your arm or leg. If the clot is in your leg, symptoms may be more noticeable or worse when you stand or walk.  A feeling of pins and needles, if the clot is in the arm. The symptoms of a DVT that has traveled to the lungs (pulmonary embolism, PE) usually start suddenly and include:  Shortness of breath while active or at rest.  Coughing or coughing up blood or blood-tinged mucus.  Chest pain that is often worse with deep breaths.  Rapid or irregular heartbeat.  Feeling light-headed or dizzy.  Fainting.  Feeling anxious.  Sweating. There may also be pain and swelling in a leg if that is where the blood clot started. These symptoms may represent  a serious problem that is an emergency. Do not wait to see if the symptoms will go away. Get medical help right away. Call your local emergency services (911 in the [place] Do not drive yourself to the hospital.  How is this diagnosed? Your health care provider will take a medical history and perform a physical exam. You may also have other tests, including:  Blood tests to assess the clotting properties of your blood.  Imaging tests, such as CT, ultrasound, MRI, X-ray, and other tests to see if you have clots anywhere in your body. How is this  treated? After a DVT is identified, it can be treated. The type of treatment that you receive depends on many factors, such as the cause of your DVT, your risk for bleeding or developing more clots, and other medical conditions that you have. Sometimes, a combination of treatments is necessary. Treatment options may be combined and include:  Monitoring the blood clot with ultrasound.  Taking medicines by mouth, such as newer blood thinners (anticoagulants), thrombolytics, or warfarin.  Taking anticoagulant medicine by injection or through an IV tube.  Wearing compression stockings or using different types ofdevices.  Surgery (rare) to remove the blood clot or to place a filter in your abdomen to stop the blood clot from traveling to your lungs. Treatments for a DVT are often divided into immediate treatment and long-term treatment (up to 3 months after DVT). You can work with your health care provider to choose the treatment program that is best for you. Follow these instructions at home: If you are taking a newer oral anticoagulant:  Take the medicine every single day at the same time each day.  Understand what foods and drugs interact with this medicine.  Understand that there are no regular blood tests required when using this medicine.  Understand the side effects of this medicine, including excessive bruising or bleeding. Ask your health care provider or pharmacist about other possible side effects. If you are taking warfarin:  Understand how to take warfarin and know which foods can affect how warfarin works in Public relations account executive.  Understand that it is dangerous to take too much or too little warfarin. Too much warfarin increases the risk of bleeding. Too little warfarin continues to allow the risk for blood clots.  Follow your PT and INR blood testing schedule. The PT and INR results allow your health care provider to adjust your dose of warfarin. It is very important that you have your  PT and INR tested as often as told by your health care provider.  Avoid major changes in your diet, or tell your health care provider before you change your diet. Arrange a visit with a registered dietitian to answer your questions. Many foods, especially foods that are high in vitamin K, can interfere with warfarin and affect the PT and INR results. Eat a consistent amount of foods that are high in vitamin K, such as:  Spinach, kale, broccoli, cabbage, collard greens, turnip greens, Brussels sprouts, peas, cauliflower, seaweed, and parsley.  Beef liver and pork liver.  Green tea.  Soybean oil.  Tell your health care provider about any and all medicines, vitamins, and supplements that you take, including aspirin and other over-the-counter anti-inflammatory medicines. Be especially cautious with aspirin and anti-inflammatory medicines. Do not take those before you ask your health care provider if it is safe to do so. This is important because many medicines can interfere with warfarin and affect the PT and INR results.  Do not start or stop taking any over-the-counter or prescription medicine unless your health care provider or pharmacist tells you to do so. If you take warfarin, you will also need to do these things:  Hold pressure over cuts for longer than usual.  Tell your dentist and other health care providers that you are taking warfarin before you have any procedures in which bleeding may occur.  Avoid alcohol or drink very small amounts. Tell your health care provider if you change your alcohol intake.  Do not use tobacco products, including cigarettes, chewing tobacco, and e-cigarettes. If you need help quitting, ask your health care provider.  Avoid contact sports. General instructions  Take over-the-counter and prescription medicines only as told by your health care provider. Anticoagulant medicines can have side effects, including easy bruising and difficulty stopping bleeding.  If you are prescribed an anticoagulant, you will also need to do these things:  Hold pressure over cuts for longer than usual.  Tell your dentist and other health care providers that you are taking anticoagulants before you have any procedures in which bleeding may occur.  Avoid contact sports.  Wear a medical alert bracelet or carry a medical alert card that says you have had a PE.  Ask your health care provider how soon you can go back to your normal activities. Stay active to prevent new blood clots from forming.  Make sure to exercise while traveling or when you have been sitting or standing for a long period of time. It is very important to exercise. Exercise your legs by walking or by tightening and relaxing your leg muscles often. Take frequent walks.  Wear compression stockings as told by your health care provider to help prevent more blood clots from forming.  Do not use tobacco products, including cigarettes, chewing tobacco, and e-cigarettes. If you need help quitting, ask your health care provider.  Keep all follow-up appointments with your health care provider. This is important. How is this prevented? Take these actions to decrease your risk of developing another DVT:  Exercise regularly. For at least 30 minutes every day, engage in:  Activity that involves moving your arms and legs.  Activity that encourages good blood flow through your body by increasing your heart rate.  Exercise your arms and legs every hour during long-distance travel (over 4 hours). Drink plenty of water and avoid drinking alcohol while traveling.  Avoid sitting or lying in bed for long periods of time without moving your legs.  Maintain a weight that is appropriate for your height. Ask your health care provider what weight is healthy for you.  If you are a woman who is over 34 years of age, avoid unnecessary use of medicines that contain estrogen. These include birth control pills.  Do not  smoke, especially if you take estrogen medicines. If you need help quitting, ask your health care provider. If you are hospitalized, prevention measures may include:  Early walking after surgery, as soon as your health care provider says that it is safe.  Receiving anticoagulants to prevent blood clots.If you cannot take anticoagulants, other options may be available, such as wearing compression stockings or using different types of devices. Get help right away if:  You have new or increased pain, swelling, or redness in an arm or leg.  You have numbness or tingling in an arm or leg.  You have shortness of breath while active or at rest.  You have chest pain.  You have a rapid or  irregular heartbeat.  You feel light-headed or dizzy.  You cough up blood.  You notice blood in your vomit, bowel movement, or urine. These symptoms may represent a serious problem that is an emergency. Do not wait to see if the symptoms will go away. Get medical help right away. Call your local emergency services (911 in the [place] Do not drive yourself to the hospital.  This information is not intended to replace advice given to you by your health care provider. Make sure you discuss any questions you have with your health care provider. Document Released: 11/03/2005 Document Revised: 04/10/2016 Document Reviewed: 02/28/2015 Elsevier Interactive Patient Education  2017 [employer].

## 2016-11-13 NOTE — Telephone Encounter (Signed)
Noted. Patient has an appointment with Dr. [name] today at 4:00 PM.

## 2016-11-14 ENCOUNTER — Telehealth: Payer: Self-pay | Admitting: Family Medicine

## 2016-11-14 ENCOUNTER — Telehealth: Payer: Self-pay | Admitting: *Deleted

## 2016-11-14 DIAGNOSIS — M25571 Pain in right ankle and joints of right foot: Secondary | ICD-10-CM

## 2016-11-14 DIAGNOSIS — G8929 Other chronic pain: Secondary | ICD-10-CM

## 2016-11-14 DIAGNOSIS — M779 Enthesopathy, unspecified: Secondary | ICD-10-CM

## 2016-11-14 NOTE — Telephone Encounter (Signed)
OK 

## 2016-11-14 NOTE — Telephone Encounter (Signed)
Yes that is fine

## 2016-11-14 NOTE — Telephone Encounter (Signed)
Pt has been scheduled.  °

## 2016-11-14 NOTE — Telephone Encounter (Addendum)
Pt states the injection given 11/11/2016 in the ankle has not helped and she wanted to know again what Dr. Ardelle AntonWagoner said that meant, and where is she to take PT. Dr. Ardelle AntonWagoner states that if the pain was not relieved with the injection in the joint it meant the pain is coming from the tendons, muscles surrounding the area. Dr. Ardelle AntonWagoner states the PT was to be for [facility] [place] [place] [place] for increase ROM, strength and gait training.Faxed referral, LOV, demographics to [facility].11/25/2016-Pt states her right foot on the side is really hurting and she would like a refill of Tramadol, her appt with pain management is 12/16/2016.Informed pt that Dr. Ardelle AntonWagoner ordered refill of Tramadol as previously. [facility]. 12/24/2016-Pt request refill of Tramadol. Informed pt Dr. Ardelle AntonWagoner had refilled as previously. [facility] to [facility].01/22/2017-Pt requested refill of Tr[facility] [place] [phone]. 01/23/2017-I informed pt that D. Wagoner had refilled the Tramadol and [facility] [place]. Pt states she is having a rough time with PT. I offered an appt and she states she thinks she has one next week, I checked, she does not, I told her I would have scheduler contact her. 03/03/2017-Pt states the Tylenol #3 in on back order and [facility] been trying to find a pharmacy that has in stock. I told pt she needed to call pharmacies on her own to verify. Pt asked if Dr. Marylene LandStover could write for hydrocodone or oxycodone temporarily. I told pt that Dr. Marylene LandStover may write for Tramadol and pt states Dr. Marylene LandStover wrote for the Tylenol #3 because the Tramadol was not working. I told her I would inform Dr. Marylene LandStover of her request.03/10/2017-Pt requested Dr. Ardelle AntonWagoner gave refill of Tramadol, and Dr. Marylene LandStover gave Tylenol #3, the tramadol doesn't help the pain, but in combination with the Tylenol #3 it works for her pain.03/11/2017-DrArdelle Anton. Wagoner asked that I speak with Edson SnowballShana,  Therapist from [facility] concerning referring pt for chronic pain management. Left message on Shana - [facility] personal cell to call to assist with referral. Informed pt of Dr. Gabriel RungWagoner's orders for tramadol only and his offer to refer pt to pain therapist counselor. Pt states understanding and asked that I make her appt, I gave pt the appt line [phone] also. [id number] was closed.03/12/2017-Left message [facility] to call to schedule for an appt.03/13/2017-Teah - [facility] called to schedule appt with pt, states if we fax the pt demographics she will call to set up appt. I told her Dr. Ardelle AntonWagoner wanted pt to be seen by Edson SnowballShana, and she stated Shana's schedule is full until 04/2017 and she would call to see if pt wanted to be seen by another counselor. Faxed demographics to [facility].03/26/2017-Pt states she had to go to the hospital 1 to 1 1/2 weeks ago for an ulcer, due to taking too many BC Powders, because she is in so much pain and the tramadol doesn't work. Pt states her GI doctor said she needed to be on a stronger pain medication until she got better, or got to a different pain management center like the one on [address]. 03/27/2017-I informed pt that Dr.Wagoner would order Vicodin 5/325mg  one tablet every 4-6 hours prn foot pain #30, and that we would refer to the Pain Management she had requested on [address]. Referral form, clinicals and demographics faxed to [facility].03/31/2017-[facility] states pt was discharged 2009, and they will not accept as a referral. 04/03/2017-I informed pt of [facility]'s refusal to reinstate as referral pt due to discharge of 2009. Required referral form, clinicals and demographics faxed to [facility]. Refaxed referral, clinicals and demographics to [facility] [phone]/15/2017-Pt requested refill of the Norco, has an appt at pain management next week. Dr. Ardelle AntonWagoner states refill  this last time, future refills will need to come from pain management. Left message informing pt the rx [place] be [place], but this would be the last pain medication from our office.

## 2016-11-14 NOTE — Telephone Encounter (Signed)
Labs are normal. Nothing to go over!

## 2016-11-14 NOTE — Telephone Encounter (Signed)
Pt would like to know if her lab results are back in? If so, she would like to have someone to call her to go over them with her before the weekend.    Thanks.

## 2016-11-18 ENCOUNTER — Telehealth: Payer: Self-pay | Admitting: Family Medicine

## 2016-11-18 NOTE — Telephone Encounter (Signed)
Caller name:Eilyn Cail Relationship to patient: Can be [phone] Pharmacy:[facility]  Reason for call:Requesting refill on tizanidine 4mg  and lab results

## 2016-11-18 NOTE — Telephone Encounter (Signed)
I have spoken to patient and informed her that we would be unable to fill Rx because we never prescribed her for the or evaluated her. Pt was informed to reach out to current prescriber and request a refill. Please advise for any further recommendations. TL/CMA

## 2016-11-25 MED ORDER — TRAMADOL HCL 50 MG PO TABS: 50.0000 mg | ORAL_TABLET | Freq: Two times a day (BID) | ORAL | 0 refills | Status: DC | PRN

## 2016-11-25 NOTE — Telephone Encounter (Signed)
Ok to refill 

## 2016-11-27 ENCOUNTER — Encounter: Payer: Self-pay | Admitting: Family Medicine

## 2016-11-27 ENCOUNTER — Ambulatory Visit (INDEPENDENT_AMBULATORY_CARE_PROVIDER_SITE_OTHER): Payer: BLUE CROSS/BLUE SHIELD | Admitting: Family Medicine

## 2016-11-27 ENCOUNTER — Telehealth: Payer: Self-pay | Admitting: Family Medicine

## 2016-11-27 VITALS — BP 120/80 | HR 104 | Temp 98.5°F | Ht 67.0 in

## 2016-11-27 DIAGNOSIS — B9789 Other viral agents as the cause of diseases classified elsewhere: Secondary | ICD-10-CM

## 2016-11-27 DIAGNOSIS — J069 Acute upper respiratory infection, unspecified: Secondary | ICD-10-CM

## 2016-11-27 DIAGNOSIS — G4700 Insomnia, unspecified: Secondary | ICD-10-CM | POA: Diagnosis not present

## 2016-11-27 DIAGNOSIS — R0683 Snoring: Secondary | ICD-10-CM

## 2016-11-27 MED ORDER — BENZONATATE 100 MG PO CAPS: 100.0000 mg | ORAL_CAPSULE | Freq: Three times a day (TID) | ORAL | 0 refills | Status: DC | PRN

## 2016-11-27 NOTE — Patient Instructions (Addendum)
   Take medicines only as directed by your health care provider.  Keep regular sleeping and waking hours. Avoid naps.  Keep a sleep diary to help you and your health care provider figure out what could be causing your insomnia. Include: ? When you sleep. ? When you wake up during the night. ? How well you sleep. ? How rested you feel the next day. ? Any side effects of medicines you are taking. ? What you eat and drink.  Make your bedroom a comfortable place where it is easy to fall asleep: ? Put up shades or special blackout curtains to block light from outside. ? Use a white noise machine to block noise. ? Keep the temperature cool.  Exercise regularly as directed by your health care provider. Avoid exercising right before bedtime.  Use relaxation techniques to manage stress. Ask your health care provider to suggest some techniques that may work well for you. These may include: ? Breathing exercises. ? Routines to release muscle tension. ? Visualizing peaceful scenes.  Cut back on alcohol, caffeinated beverages, and cigarettes, especially close to bedtime. These can disrupt your sleep.  Do not overeat or eat spicy foods right before bedtime. This can lead to digestive discomfort that can make it hard for you to sleep.  Limit screen use before bedtime. This includes: ? Watching TV. ? Using your smartphone, tablet, and computer.  Stick to a routine. This can help you fall asleep faster. Try to do a quiet activity, brush your teeth, and go to bed at the same time each night.  Get out of bed if you are still awake after 15 minutes of trying to sleep. Keep the lights down, but try reading or doing a quiet activity. When you feel sleepy, go back to bed.  Make sure that you drive carefully. Avoid driving if you feel very sleepy.  Keep all follow-up appointments as directed by your health care provider. This is important.   Contact [phone] to schedule an appointment or inquire  about cost/insurance coverage. Ask for "Cognitive Behavioral Therapy" to help with sleep.  Flonase for your nasal congestion. 2 sprays each nostril, once per day. Aim towards the same side eye.

## 2016-11-27 NOTE — Telephone Encounter (Signed)
Relation to UJ:WJXBpt:self Call back number:[phone]   Reason for call:  Patient was seen today and stated PCP discussed gabapentin alternatives and would like to discuss, please advise

## 2016-11-27 NOTE — Progress Notes (Signed)
Chief Complaint  Patient presents with  . Transitions Of Care    pt having thick mucus in the throat-sxs x 4 days    Diane [name] here for URI complaints.  Duration: 4 days   Associated symptoms: Thick mucous in the throat Denies: subjective fever Treatment to date: Mucinex Sick contacts: No   Sleep Worsening, a few months now. Trouble falling asleep, once she falls asleep. 4-5 hrs of sleep. Tries to go to sleep at 11 PM. She does nap.  She also snores at night and has been told she needs to be evaluated for sleep apnea in the past. She will toss and turn in bed for 4-5 hours before falling asleep. She will watch TV while in bed.   ROS:  Const: Denies fevers HEENT: As noted in HPI Lungs: No SOB  Past Medical History:  Diagnosis Date  . ADD (attention deficit disorder with hyperactivity)    no current med.  . Anemia    takes iron supplement  . Anxiety   . Cough 10/16/2016  . Depression   . Family history of adverse reaction to anesthesia    pt's father has hx. of being hard to wake up post-op  . GERD (gastroesophageal reflux disease)   . History of gastric ulcer   . History of kidney stones   . IBS (irritable bowel syndrome)   . Migraines   . Osteoarthritis    right ankle  . Osteochondral lesion 09/2016   right ankle  . Plantar fasciitis of left foot   . PONV (postoperative nausea and vomiting)   . Pseudotumor cerebri   . Wears partial dentures    upper   Family History  Problem Relation Age of Onset  . Brain cancer Father   . Anesthesia problems Father     hard to wake up post-op  . Lung cancer Father   . Breast cancer Maternal Grandmother   . Ovarian cancer Maternal Grandmother   . Diabetes Maternal Grandmother   . Heart disease Maternal Grandmother   . Irritable bowel syndrome Mother   . Prostate cancer Maternal Grandfather   . Melanoma Maternal Grandfather     BP 120/80 (BP Location: Left Arm, Patient Position: Sitting, Cuff Size: Large)    Pulse (!) 104   Temp 98.5 F (36.9 C) (Oral)   Ht 5\' 7"  (1.702 m)   LMP 11/15/2016   SpO2 98%  General: Awake, alert, appears stated age HEENT: AT, [place], ears patent b/l and TM's neg, nares patent w/o discharge, pharynx pink and without exudates, thick post nasal discharge appreciated, MMM Neck: No masses or asymmetry Heart: RRR, no murmurs, no bruits Lungs: CTAB, no accessory muscle use Psych: Age appropriate judgment and insight, normal mood and affect  Insomnia, unspecified type  Snoring - Plan: Ambulatory referral to Pulmonology  Viral URI with cough - Plan: benzonatate (TESSALON) 100 MG capsule  Orders as above. Counseled on sleep hygiene. She has quite a few areas she can improve on. Also brought up CBT and provided number to inquire about this. We are on the same page that fewer medications are ideal, so will try non-pharmacologic options first. Will also r/o OSA. Continue to push fluids, practice good hand hygiene, cover mouth when coughing. F/u in 2 mo. Sooner if worsening. Pt voiced understanding and agreement to the plan.  Jilda Rocheicholas Paul CassvilleWendling, DO 11/27/16 3:16 PM

## 2016-11-28 ENCOUNTER — Ambulatory Visit: Payer: PRIVATE HEALTH INSURANCE | Admitting: Family Medicine

## 2016-11-28 NOTE — Telephone Encounter (Signed)
Called pt to discuss provider recommendations. No answer, left detailed message for pt.

## 2016-11-28 NOTE — Telephone Encounter (Signed)
Can try magnesium 400-500 mg daily instead of Neurontin for headaches. If she would like to discuss further, please schedule appt. TY.

## 2016-12-02 ENCOUNTER — Observation Stay (HOSPITAL_COMMUNITY)
Admission: EM | Admit: 2016-12-02 | Discharge: 2016-12-04 | Disposition: A | Discharge status: Home / Self Care | Payer: BLUE CROSS/BLUE SHIELD | Attending: Internal Medicine | Admitting: Internal Medicine

## 2016-12-02 ENCOUNTER — Encounter (HOSPITAL_COMMUNITY): Payer: Self-pay

## 2016-12-02 ENCOUNTER — Telehealth: Payer: Self-pay | Admitting: Family Medicine

## 2016-12-02 ENCOUNTER — Emergency Department (HOSPITAL_COMMUNITY): Payer: BLUE CROSS/BLUE SHIELD

## 2016-12-02 ENCOUNTER — Ambulatory Visit: Payer: PRIVATE HEALTH INSURANCE | Admitting: Podiatry

## 2016-12-02 DIAGNOSIS — Z79899 Other long term (current) drug therapy: Secondary | ICD-10-CM | POA: Insufficient documentation

## 2016-12-02 DIAGNOSIS — F411 Generalized anxiety disorder: Secondary | ICD-10-CM | POA: Insufficient documentation

## 2016-12-02 DIAGNOSIS — K8010 Calculus of gallbladder with chronic cholecystitis without obstruction: Secondary | ICD-10-CM | POA: Diagnosis not present

## 2016-12-02 DIAGNOSIS — D649 Anemia, unspecified: Secondary | ICD-10-CM | POA: Insufficient documentation

## 2016-12-02 DIAGNOSIS — K8000 Calculus of gallbladder with acute cholecystitis without obstruction: Secondary | ICD-10-CM | POA: Diagnosis present

## 2016-12-02 DIAGNOSIS — F329 Major depressive disorder, single episode, unspecified: Secondary | ICD-10-CM | POA: Insufficient documentation

## 2016-12-02 DIAGNOSIS — K589 Irritable bowel syndrome without diarrhea: Secondary | ICD-10-CM | POA: Insufficient documentation

## 2016-12-02 DIAGNOSIS — R1011 Right upper quadrant pain: Secondary | ICD-10-CM | POA: Diagnosis present

## 2016-12-02 DIAGNOSIS — R112 Nausea with vomiting, unspecified: Secondary | ICD-10-CM | POA: Diagnosis present

## 2016-12-02 DIAGNOSIS — Z87891 Personal history of nicotine dependence: Secondary | ICD-10-CM | POA: Diagnosis not present

## 2016-12-02 DIAGNOSIS — Z87442 Personal history of urinary calculi: Secondary | ICD-10-CM | POA: Insufficient documentation

## 2016-12-02 DIAGNOSIS — Z419 Encounter for procedure for purposes other than remedying health state, unspecified: Secondary | ICD-10-CM

## 2016-12-02 HISTORY — DX: Calculus of gallbladder with acute cholecystitis without obstruction: K80.00

## 2016-12-02 LAB — I-STAT BETA HCG BLOOD, ED (MC, WL, AP ONLY): I-stat hCG, quantitative: 5.7 m[IU]/mL — ABNORMAL HIGH (ref <5)

## 2016-12-02 LAB — URINALYSIS, ROUTINE W REFLEX MICROSCOPIC
BILIRUBIN URINE: NEGATIVE
GLUCOSE, UA: NEGATIVE mg/dL
Hgb urine dipstick: NEGATIVE
KETONES UR: NEGATIVE mg/dL
Leukocytes, UA: NEGATIVE
NITRITE: NEGATIVE
PH: 7 (ref 5.0–8.0)
Protein, ur: NEGATIVE mg/dL
Specific Gravity, Urine: 1.012 (ref 1.005–1.030)

## 2016-12-02 LAB — COMPREHENSIVE METABOLIC PANEL
ALK PHOS: 126 U/L (ref 38–126)
ALT: 51 U/L (ref 14–54)
ANION GAP: 10 (ref 5–15)
AST: 107 U/L — ABNORMAL HIGH (ref 15–41)
Albumin: 4.3 g/dL (ref 3.5–5.0)
BUN: 10 mg/dL (ref 6–20)
CALCIUM: 9.1 mg/dL (ref 8.9–10.3)
CHLORIDE: 102 mmol/L (ref 101–111)
CO2: 25 mmol/L (ref 22–32)
CREATININE: 0.61 mg/dL (ref 0.44–1.00)
Glucose, Bld: 110 mg/dL — ABNORMAL HIGH (ref 65–99)
Potassium: 4.1 mmol/L (ref 3.5–5.1)
SODIUM: 137 mmol/L (ref 135–145)
Total Bilirubin: 0.2 mg/dL — ABNORMAL LOW (ref 0.3–1.2)
Total Protein: 7.5 g/dL (ref 6.5–8.1)

## 2016-12-02 LAB — CBC
HCT: 38.7 % (ref 36.0–46.0)
HEMOGLOBIN: 13 g/dL (ref 12.0–15.0)
MCH: 29.4 pg (ref 26.0–34.0)
MCHC: 33.6 g/dL (ref 30.0–36.0)
MCV: 87.6 fL (ref 78.0–100.0)
PLATELETS: 292 10*3/uL (ref 150–400)
RBC: 4.42 MIL/uL (ref 3.87–5.11)
RDW: 12.5 % (ref 11.5–15.5)
WBC: 13.4 10*3/uL — ABNORMAL HIGH (ref 4.0–10.5)

## 2016-12-02 LAB — PREGNANCY, URINE: Preg Test, Ur: NEGATIVE

## 2016-12-02 LAB — LIPASE, BLOOD: LIPASE: 20 U/L (ref 11–51)

## 2016-12-02 MED ORDER — ONDANSETRON HCL 4 MG/2ML IJ SOLN
4.0000 mg | Freq: Once | INTRAMUSCULAR | Status: AC
  Administered 2016-12-02: 4 mg via INTRAVENOUS
  Filled 2016-12-02: qty 2

## 2016-12-02 MED ORDER — ONDANSETRON HCL 4 MG/2ML IJ SOLN
4.0000 mg | Freq: Once | INTRAMUSCULAR | Status: AC
  Administered 2016-12-03: 4 mg via INTRAVENOUS
  Filled 2016-12-02: qty 2

## 2016-12-02 MED ORDER — HYDROMORPHONE HCL 1 MG/ML IJ SOLN
1.0000 mg | Freq: Once | INTRAMUSCULAR | Status: AC
  Administered 2016-12-02: 1 mg via INTRAVENOUS
  Filled 2016-12-02: qty 1

## 2016-12-02 MED ORDER — SODIUM CHLORIDE 0.9 % IV BOLUS (SEPSIS)
1000.0000 mL | Freq: Once | INTRAVENOUS | Status: AC
  Administered 2016-12-02: 1000 mL via INTRAVENOUS

## 2016-12-02 MED ORDER — HYDROMORPHONE HCL 1 MG/ML IJ SOLN
1.0000 mg | Freq: Once | INTRAMUSCULAR | Status: AC
  Administered 2016-12-03: 1 mg via INTRAVENOUS
  Filled 2016-12-02: qty 1

## 2016-12-02 MED ORDER — ONDANSETRON 4 MG PO TBDP
4.0000 mg | ORAL_TABLET | Freq: Once | ORAL | Status: AC | PRN
  Administered 2016-12-02: 4 mg via ORAL
  Filled 2016-12-02: qty 1

## 2016-12-02 NOTE — ED Provider Notes (Signed)
11:10 PM Patient care assumed from Melburn Hake, PA-C at shift change. Patient presenting for right upper quadrant pain with nausea and vomiting. She does have a mildly elevated AST and leukocytosis of 13.4. Ultrasound does reveal one large gallstone normal trouble gallstones in the gallbladder. There was a positive sonographic Murphy sign and the patient remains tender in her right upper quadrant on repeat assessment. Unable to exclude early acute cholecystitis despite lack of gallbladder wall thickening and pericholecystic fluid. Plan to consult general surgery.  11:34 PM Findings discussed with Dr. Daphine Deutscher in which includes persistent focal tenderness in the right upper quadrant with evidence of gallstones and sonographic Murphy's sign, in association with a leukocytosis and AST of 107. Dr. Daphine Deutscher states that the patient is likely "sore" and that she "may have acute cholecystitis" but it "doesn't sound like an emergent issue". Dr. Daphine Deutscher requests that the hospitalist be consulted for admission.  11:41 PM Case discussed with Dr. Katrinka Blazing who is amenable to observation admission. Temporary orders placed.   Results for orders placed or performed during the hospital encounter of 12/02/16  Lipase, blood  Result Value Ref Range   Lipase 20 11 - 51 U/L  Comprehensive metabolic panel  Result Value Ref Range   Sodium 137 135 - 145 mmol/L   Potassium 4.1 3.5 - 5.1 mmol/L   Chloride 102 101 - 111 mmol/L   CO2 25 22 - 32 mmol/L   Glucose, Bld 110 (H) 65 - 99 mg/dL   BUN 10 6 - 20 mg/dL   Creatinine, Ser 1.61 0.44 - 1.00 mg/dL   Calcium 9.1 8.9 - 09.6 mg/dL   Total Protein 7.5 6.5 - 8.1 g/dL   Albumin 4.3 3.5 - 5.0 g/dL   AST 045 (H) 15 - 41 U/L   ALT 51 14 - 54 U/L   Alkaline Phosphatase 126 38 - 126 U/L   Total Bilirubin 0.2 (L) 0.3 - 1.2 mg/dL   GFR calc non Af Amer >60 >60 mL/min   GFR calc Af Amer >60 >60 mL/min   Anion gap 10 5 - 15  CBC  Result Value Ref Range   WBC 13.4 (H) 4.0 - 10.5  K/uL   RBC 4.42 3.87 - 5.11 MIL/uL   Hemoglobin 13.0 12.0 - 15.0 g/dL   HCT 40.9 81.1 - 91.4 %   MCV 87.6 78.0 - 100.0 fL   MCH 29.4 26.0 - 34.0 pg   MCHC 33.6 30.0 - 36.0 g/dL   RDW 78.2 95.6 - 21.3 %   Platelets 292 150 - 400 K/uL  Urinalysis, Routine w reflex microscopic  Result Value Ref Range   Color, Urine YELLOW YELLOW   APPearance CLEAR CLEAR   Specific Gravity, Urine 1.012 1.005 - 1.030   pH 7.0 5.0 - 8.0   Glucose, UA NEGATIVE NEGATIVE mg/dL   Hgb urine dipstick NEGATIVE NEGATIVE   Bilirubin Urine NEGATIVE NEGATIVE   Ketones, ur NEGATIVE NEGATIVE mg/dL   Protein, ur NEGATIVE NEGATIVE mg/dL   Nitrite NEGATIVE NEGATIVE   Leukocytes, UA NEGATIVE NEGATIVE  Pregnancy, urine  Result Value Ref Range   Preg Test, Ur NEGATIVE NEGATIVE  I-Stat beta hCG blood, ED  Result Value Ref Range   I-stat hCG, quantitative 5.7 (H) <5 mIU/mL   Comment 3           US Abdomen Complete  Result Date: 12/02/2016 CLINICAL DATA:  Right upper quadrant pain to the right flank EXAM: ABDOMEN ULTRASOUND COMPLETE COMPARISON:  CT 06/11/2014 FINDINGS: Gallbladder:  Contracted. Echogenic arc with dense posterior shadowing compatible with large stone or multiple stones. Sonographer reports positive sonographic Murphy's. Normal wall thickness at 2.6 mm Common bile duct: Diameter: Normal at 3.5 mm Liver: Heterogeneous with areas of increased echogenicity suggesting fatty infiltration. No focal hepatic abnormality. IVC: No abnormality visualized. Pancreas: Visualized portion unremarkable. Spleen: Size and appearance within normal limits. Right Kidney: Length: 12.1. Echogenicity within normal limits. No mass or hydronephrosis visualized. Left Kidney: Length: 10 point for. Echogenicity within normal limits. No mass or hydronephrosis visualized. Abdominal aorta: Obscured by bowel gas Other findings: None. IMPRESSION: 1. Suspect large stone or multiple stones in the gallbladder. Sonographer reports a positive sonographic  Murphy's sign, however wall thickness is normal ; cholecystitis cannot be excluded. No biliary dilatation. 2. Heterogenous echotexture of the liver suggests fatty infiltration. Electronically Signed   By: Jasmine PangKim  Fujinaga M.D.   On: 12/02/2016 21:50      Antony MaduraKelly Aleeha Boline, PA-C 12/02/16 [id number]    Gwyneth SproutWhitney Plunkett, MD 12/03/16 [phone]

## 2016-12-02 NOTE — Telephone Encounter (Signed)
Patient called stating that she was seen on 11/27/16 and was given Tessalon. She states that she is not feeling better, but feeling worse and the Tessalon does not seem to be working at all. She states that she has had a fever recently as well. She would like to know what she can do for this? Please advise  Phone: [phone]

## 2016-12-02 NOTE — ED Notes (Signed)
Patient Beta-hcg was 5.7IU/L.

## 2016-12-02 NOTE — ED Notes (Signed)
RN spoke to Hilton HotelsUltrasound Tech.  She is on her way to see Pt now.

## 2016-12-02 NOTE — ED Triage Notes (Addendum)
Per EMS, Pt, from home, c/o RUQ pain radiating into R flank and emesis starting this afternoon and difficulty urinating x "a couple weeks."  Sts "I've been having to force it(urine) out."  Pain score 10/10.  Pt reports taking phenergan around 1500.  Hx of kidney stones.  Denies diarrhea.

## 2016-12-02 NOTE — ED Provider Notes (Signed)
[facility]-EMERGENCY DEPT Provider Note   CSN: [id number] Arrival date & time: 12/02/16  1610     History   Chief Complaint Chief Complaint  Patient presents with  . Abdominal Pain  . Emesis    HPI Diane [name] is a 36 y.o. female.  HPI   Patient is a 35 year old female with history of kidney stones, IBS and GERD who presents the ED with complaint of right upper quadrant pain, onset 3pm. Patient reports while she was watching TV with her family at home this afternoon she had sudden onset of constant sharp pain to her right upper quadrant. Patient reports pain was aggravated with certain movements. Denies any leading factors. Endorses associated nausea and 5 episodes of NBNB vomiting. Patient also reports she has had difficulty urinating and states she has to strain to urinate. Patient reports this is been present for the past few weeks to months and denies any acute changes. Denies fever, chills, chest pain, shortness of breath, hematemesis, diarrhea, constipation, dysuria, hematuria, vaginal bleeding, vaginal discharge. LMP 11/15/16.  Past Medical History:  Diagnosis Date  . ADD (attention deficit disorder with hyperactivity)    no current med.  . Anemia    takes iron supplement  . Anxiety   . Cough 10/16/2016  . Depression   . Family history of adverse reaction to anesthesia    pt's father has hx. of being hard to wake up post-op  . GERD (gastroesophageal reflux disease)   . History of gastric ulcer   . History of kidney stones   . IBS (irritable bowel syndrome)   . Migraines   . Osteoarthritis    right ankle  . Osteochondral lesion 09/2016   right ankle  . Plantar fasciitis of left foot   . PONV (postoperative nausea and vomiting)   . Pseudotumor cerebri   . Wears partial dentures    upper    Patient Active Problem List   Diagnosis Date Noted  . Ankle fracture 01/05/2016  . Chronic pain of right knee 04/12/2015  . Gastric ulcer 03/16/2015  . Chronic cough  12/27/2013  . Pleural effusion 12/15/2013  . DEPRESSION, ACUTE 10/02/2008  . NARCOTIC ABUSE 10/02/2008  . BENIGN INTRACRANIAL HYPERTENSION 06/16/2008  . BACK PAIN 03/28/2008  . WEIGHT GAIN 01/25/2008  . ANXIETY STATE, UNSPECIFIED 11/09/2007  . HEADACHE 07/09/2007    Past Surgical History:  Procedure Laterality Date  . ANKLE ARTHROSCOPY Right 10/22/2016   Procedure: ANKLE ARTHROSCOPY;  Surgeon: Vivi Barrack, DPM;  Location: [facility];  Service: Podiatry;  Laterality: Right;  GENERAL/REG BLOCK  . BLADDER SURGERY     x 2 - as a child to stretch bladder  . CESAREAN SECTION  12/24/2009  . CESAREAN SECTION  11/20/2011   Procedure: CESAREAN SECTION;  Surgeon: Lenoard Aden, MD;  Location: [facility];  Service: Gynecology;  Laterality: N/A;  . CHROMOPERTUBATION  03/05/2007  . CYSTOSCOPY WITH RETROGRADE PYELOGRAM, URETEROSCOPY AND STENT PLACEMENT Left 08/10/2006  . HARDWARE REMOVAL Right 07/01/2016   Procedure: HARDWARE REMOVAL;  Surgeon: Cammy Copa, MD;  Location: [facility] OR;  Service: Orthopedics;  Laterality: Right;  . HYSTEROSCOPY W/ ENDOMETRIAL ABLATION  03/05/2007  . KNEE ARTHROSCOPY WITH LATERAL RELEASE Right 12/13/2014   Procedure: KNEE ARTHROSCOPY WITH LATERAL RELEASE;  Surgeon: Thera Flake., MD;  Location: [facility];  Service: Orthopedics;  Laterality: Right;  . LAPAROSCOPIC APPENDECTOMY N/A 06/11/2014   Procedure: APPENDECTOMY LAPAROSCOPIC;  Surgeon: Velora Heckler, MD;  Location:  [facility];  Service: General;  Laterality: N/A;  . ORIF ANKLE FRACTURE Right 01/05/2016   Procedure: OPEN REDUCTION INTERNAL FIXATION (ORIF) ANKLE FRACTURE;  Surgeon: Cammy Copa, MD;  Location: [facility] OR;  Service: Orthopedics;  Laterality: Right;  . OVARIAN CYST SURGERY Right 03/05/2007  . shunts   2009   tried to do shunts for pseudotumor   . SYNOVECTOMY Right 12/13/2014   Procedure: PLICA SYNOVECTOMY;  Surgeon: Thera Flake., MD;  Location: [facility];   Service: Orthopedics;  Laterality: Right;  . TONSILLECTOMY AND ADENOIDECTOMY    . WISDOM TOOTH EXTRACTION      OB History    Gravida Para Term Preterm AB Living   2 2 1 1  0 2   SAB TAB Ectopic Multiple Live Births   0 0 0 0 1       Home Medications    Prior to Admission medications   Medication Sig Start Date End Date Taking? Authorizing Provider  benzonatate (TESSALON) 100 MG capsule Take 1 capsule (100 mg total) by mouth 3 (three) times daily as needed. 11/27/16  Yes Jilda Roche Wendling, DO  diazepam (VALIUM) 5 MG tablet Take 1 tablet (5 mg total) by mouth every 12 (twelve) hours as needed for anxiety. 11/05/16  Yes Nelwyn Salisbury, MD  dicyclomine (BENTYL) 10 MG capsule Take 1 capsule (10 mg total) by mouth 3 (three) times daily before meals. 11/07/16  Yes Meryl Dare, MD  fluticasone (FLONASE) 50 MCG/ACT nasal spray Place 1 spray into both nostrils daily. 09/11/16 09/11/17 Yes Praveen Mannam, MD  gabapentin (NEURONTIN) 300 MG capsule Take 2 capsules (600 mg total) by mouth 3 (three) times daily. Patient taking differently: Take 600-900 mg by mouth 2 (two) times daily. Take 2 capsules (600 mg) in the morning and Take 3 capsules (900 mg) in the evening. 11/06/16  Yes Nelwyn Salisbury, MD  Magnesium 400 MG TABS Take 1,200 mg by mouth daily.    Yes Historical Provider, MD  NONFORMULARY OR COMPOUNDED ITEM [facility]:  Peripheral Neuropathy Cream - Bupivacaine 1%, Doxepin 3%, Gabapentin 6%, Pentoxifylline 3%, Topiramate 1%, + Diclofenac 3%, apply 1-2 grams to affected area 3-4 times daily. 11/04/16  Yes Vivi Barrack, DPM  oxybutynin (DITROPAN-XL) 10 MG 24 hr tablet Take 1 tablet (10 mg total) by mouth at bedtime. 11/06/16  Yes Nelwyn Salisbury, MD  promethazine (PHENERGAN) 25 MG tablet Take 1 tablet (25 mg total) by mouth every 8 (eight) hours as needed for nausea or vomiting. 10/22/16  Yes Vivi Barrack, DPM  ranitidine (ZANTAC) 150 MG capsule Take 300 mg by mouth 2 (two)  times daily.    Yes Historical Provider, MD  tiZANidine (ZANAFLEX) 4 MG tablet Take 4 mg by mouth every 6 (six) hours as needed for muscle spasms.  11/18/16  Yes Historical Provider, MD  traMADol (ULTRAM) 50 MG tablet Take 1 tablet (50 mg total) by mouth every 12 (twelve) hours as needed. Patient taking differently: Take 50 mg by mouth every 12 (twelve) hours as needed for moderate pain or severe pain.  11/25/16  Yes Vivi Barrack, DPM  venlafaxine XR (EFFEXOR-XR) 150 MG 24 hr capsule Take 2 capsules (300 mg total) by mouth daily before breakfast. 11/06/16  Yes Nelwyn Salisbury, MD  Fe-Succ Ac-C-Thre Ac-B12-FA (FERREX 150 FORTE PLUS) 50-100 MG CAPS Take 150 mg by mouth 2 (two) times daily. Patient not taking: Reported on 12/02/2016 11/08/15   Nelwyn Salisbury, MD  Family History Family History  Problem Relation Age of Onset  . Brain cancer Father   . Anesthesia problems Father     hard to wake up post-op  . Lung cancer Father   . Breast cancer Maternal Grandmother   . Ovarian cancer Maternal Grandmother   . Diabetes Maternal Grandmother   . Heart disease Maternal Grandmother   . Irritable bowel syndrome Mother   . Prostate cancer Maternal Grandfather   . Melanoma Maternal Grandfather     Social History Social History  Substance Use Topics  . Smoking status: Former Smoker    Years: 15.00    Types: Cigarettes  . Smokeless tobacco: Never Used     Comment: 2 cig./day  . Alcohol use 0.0 oz/week     Comment: occasionally     Allergies   Nsaids; Nickel; Adhesive [tape]; Metoclopramide hcl; and Prochlorperazine edisylate   Review of Systems Review of Systems  Gastrointestinal: Positive for abdominal pain, nausea and vomiting.  Genitourinary: Positive for difficulty urinating.  All other systems reviewed and are negative.    Physical Exam Updated Vital Signs BP 127/88 (BP Location: Left Arm)   Pulse 115   Temp 98.8 F (37.1 C) (Oral)   Resp 18   Ht 5\' 7"  (1.702 m)   Wt  95.3 kg   LMP 11/15/2016   SpO2 96%   BMI 32.89 kg/m   Physical Exam  Constitutional: She is oriented to person, place, and time. She appears well-developed and well-nourished. No distress.  HENT:  Head: Normocephalic and atraumatic.  Mouth/Throat: Uvula is midline and oropharynx is clear and moist. Mucous membranes are dry. No oropharyngeal exudate, posterior oropharyngeal edema, posterior oropharyngeal erythema or tonsillar abscesses. No tonsillar exudate.  Eyes: Conjunctivae and EOM are normal. Right eye exhibits no discharge. Left eye exhibits no discharge. No scleral icterus.  Neck: Normal range of motion. Neck supple.  Cardiovascular: Normal rate, regular rhythm, normal heart sounds and intact distal pulses.   HR 88  Pulmonary/Chest: Effort normal and breath sounds normal. No respiratory distress. She has no wheezes. She has no rales. She exhibits no tenderness.  Abdominal: Soft. Bowel sounds are normal. She exhibits no distension and no mass. There is tenderness in the right upper quadrant. There is guarding and positive Murphy's sign. There is no rigidity, no rebound and no CVA tenderness. No hernia.  Musculoskeletal: She exhibits no edema.  Neurological: She is alert and oriented to person, place, and time.  Skin: Skin is warm and dry. She is not diaphoretic.  Nursing note and vitals reviewed.    ED Treatments / Results  Labs (all labs ordered are listed, but only abnormal results are displayed) Labs Reviewed  COMPREHENSIVE METABOLIC PANEL - Abnormal; Notable for the following:       Result Value   Glucose, Bld 110 (*)    AST 107 (*)    Total Bilirubin 0.2 (*)    All other components within normal limits  CBC - Abnormal; Notable for the following:    WBC 13.4 (*)    All other components within normal limits  I-STAT BETA HCG BLOOD, ED ([facility], [facility], [facility] ONLY) - Abnormal; Notable for the following:    I-stat hCG, quantitative 5.7 (*)    All other components within normal limits   LIPASE, BLOOD  URINALYSIS, ROUTINE W REFLEX MICROSCOPIC  POC URINE PREG, ED    EKG  EKG Interpretation None       Radiology No results found.  Procedures Procedures (  including critical care time)  Medications Ordered in ED Medications  HYDROmorphone (DILAUDID) injection 1 mg (not administered)  ondansetron (ZOFRAN-ODT) disintegrating tablet 4 mg (4 mg Oral Given 12/02/16 1626)     Initial Impression / Assessment and Plan / ED Course  I have reviewed the triage vital signs and the nursing notes.  Pertinent labs & imaging results that were available during my care of the patient were reviewed by me and considered in my medical decision making (see chart for details).  Clinical Course    Patient presents with sudden onset right upper quadrant pain that started this afternoon she was at home resting. Endorses associated nausea and vomiting. Denies fever, dysuria, diarrhea. VSS. Exam revealed dry mucous membranes, tenderness to palpation over right upper quadrant with associated guarding, positive Murphy sign. Patient given IV fluids, antiemetics and pain meds. Labs revealed WBC 13.4, AST 107. I-STAT beta hCG 5.7, which I suspect is false positive, will send urine preg. UA unremarkable, no signs of infection. RUQ US ordered for further evaluation due to concern for cholecystitis. On reevaluation patient reports short period of mild improvement of pain but notes her pain has started to worsen. Patient given second dose of pain meds and antiemetics.  Hand-off to TRW AutomotiveKelly Humes, PA-C. Pending urine pregnancy and RUQ US. Dispo pending US results.   Final Clinical Impressions(s) / ED Diagnoses   Final diagnoses:  None    New Prescriptions New Prescriptions   No medications on file     Barrett Henleicole Elizabeth Nadeau, PA-C 12/02/16 2105    Maia PlanJoshua G Long, MD 12/03/16 1113

## 2016-12-03 ENCOUNTER — Observation Stay (HOSPITAL_COMMUNITY): Payer: BLUE CROSS/BLUE SHIELD | Admitting: Certified Registered Nurse Anesthetist

## 2016-12-03 ENCOUNTER — Observation Stay (HOSPITAL_COMMUNITY): Payer: BLUE CROSS/BLUE SHIELD

## 2016-12-03 ENCOUNTER — Encounter (HOSPITAL_COMMUNITY)
Admission: EM | Disposition: A | Discharge status: Home / Self Care | Payer: Self-pay | Source: Home / Self Care | Attending: Emergency Medicine

## 2016-12-03 ENCOUNTER — Encounter (HOSPITAL_COMMUNITY): Payer: Self-pay | Admitting: Certified Registered Nurse Anesthetist

## 2016-12-03 DIAGNOSIS — R1011 Right upper quadrant pain: Secondary | ICD-10-CM

## 2016-12-03 DIAGNOSIS — R112 Nausea with vomiting, unspecified: Secondary | ICD-10-CM | POA: Diagnosis present

## 2016-12-03 DIAGNOSIS — K8000 Calculus of gallbladder with acute cholecystitis without obstruction: Secondary | ICD-10-CM

## 2016-12-03 HISTORY — PX: CHOLECYSTECTOMY: SHX55

## 2016-12-03 HISTORY — DX: Nausea with vomiting, unspecified: R11.2

## 2016-12-03 LAB — COMPREHENSIVE METABOLIC PANEL
ALT: 217 U/L — AB (ref 14–54)
ANION GAP: 4 — AB (ref 5–15)
AST: 314 U/L — ABNORMAL HIGH (ref 15–41)
Albumin: 3.5 g/dL (ref 3.5–5.0)
Alkaline Phosphatase: 171 U/L — ABNORMAL HIGH (ref 38–126)
BUN: 8 mg/dL (ref 6–20)
CALCIUM: 8.1 mg/dL — AB (ref 8.9–10.3)
CHLORIDE: 103 mmol/L (ref 101–111)
CO2: 28 mmol/L (ref 22–32)
CREATININE: 0.62 mg/dL (ref 0.44–1.00)
Glucose, Bld: 99 mg/dL (ref 65–99)
Potassium: 3.9 mmol/L (ref 3.5–5.1)
SODIUM: 135 mmol/L (ref 135–145)
Total Bilirubin: 0.3 mg/dL (ref 0.3–1.2)
Total Protein: 6 g/dL — ABNORMAL LOW (ref 6.5–8.1)

## 2016-12-03 LAB — PROTIME-INR
INR: 0.98
PROTHROMBIN TIME: 13 s (ref 11.4–15.2)

## 2016-12-03 LAB — CBC
HCT: 34.9 % — ABNORMAL LOW (ref 36.0–46.0)
Hemoglobin: 11.4 g/dL — ABNORMAL LOW (ref 12.0–15.0)
MCH: 28.6 pg (ref 26.0–34.0)
MCHC: 32.7 g/dL (ref 30.0–36.0)
MCV: 87.5 fL (ref 78.0–100.0)
PLATELETS: 227 10*3/uL (ref 150–400)
RBC: 3.99 MIL/uL (ref 3.87–5.11)
RDW: 12.4 % (ref 11.5–15.5)
WBC: 9 10*3/uL (ref 4.0–10.5)

## 2016-12-03 LAB — APTT: aPTT: 25 seconds (ref 24–36)

## 2016-12-03 SURGERY — LAPAROSCOPIC CHOLECYSTECTOMY WITH INTRAOPERATIVE CHOLANGIOGRAM
Anesthesia: General

## 2016-12-03 MED ORDER — ONDANSETRON HCL 4 MG/2ML IJ SOLN
INTRAMUSCULAR | Status: AC
  Filled 2016-12-03: qty 2

## 2016-12-03 MED ORDER — HYDROMORPHONE HCL 2 MG/ML IJ SOLN
1.0000 mg | Freq: Once | INTRAMUSCULAR | Status: AC
  Administered 2016-12-03: 1 mg via INTRAVENOUS
  Filled 2016-12-03: qty 1

## 2016-12-03 MED ORDER — FENTANYL CITRATE (PF) 250 MCG/5ML IJ SOLN
INTRAMUSCULAR | Status: AC
  Filled 2016-12-03: qty 5

## 2016-12-03 MED ORDER — LACTATED RINGERS IV SOLN
INTRAVENOUS | Status: DC
  Administered 2016-12-03 (×2): via INTRAVENOUS

## 2016-12-03 MED ORDER — ROCURONIUM BROMIDE 50 MG/5ML IV SOSY
PREFILLED_SYRINGE | INTRAVENOUS | Status: AC
  Filled 2016-12-03: qty 5

## 2016-12-03 MED ORDER — BUPIVACAINE HCL (PF) 0.25 % IJ SOLN
INTRAMUSCULAR | Status: AC
  Filled 2016-12-03: qty 30

## 2016-12-03 MED ORDER — DEXAMETHASONE SODIUM PHOSPHATE 10 MG/ML IJ SOLN
INTRAMUSCULAR | Status: AC
  Filled 2016-12-03: qty 1

## 2016-12-03 MED ORDER — SUGAMMADEX SODIUM 200 MG/2ML IV SOLN
INTRAVENOUS | Status: AC
  Filled 2016-12-03: qty 2

## 2016-12-03 MED ORDER — HYDROMORPHONE HCL 2 MG/ML IJ SOLN
1.0000 mg | INTRAMUSCULAR | Status: DC | PRN
  Administered 2016-12-03: 1 mg via INTRAVENOUS
  Filled 2016-12-03: qty 1

## 2016-12-03 MED ORDER — ACETAMINOPHEN 325 MG PO TABS: 650.0000 mg | ORAL_TABLET | Freq: Four times a day (QID) | ORAL | Status: DC | PRN

## 2016-12-03 MED ORDER — SUCCINYLCHOLINE CHLORIDE 200 MG/10ML IV SOSY
PREFILLED_SYRINGE | INTRAVENOUS | Status: AC
  Filled 2016-12-03: qty 10

## 2016-12-03 MED ORDER — EPHEDRINE 5 MG/ML INJ
INTRAVENOUS | Status: AC
  Filled 2016-12-03: qty 10

## 2016-12-03 MED ORDER — PHENYLEPHRINE HCL 10 MG/ML IJ SOLN
INTRAMUSCULAR | Status: DC | PRN
  Administered 2016-12-03: 120 ug via INTRAVENOUS
  Administered 2016-12-03: 40 ug via INTRAVENOUS

## 2016-12-03 MED ORDER — PROPOFOL 10 MG/ML IV BOLUS
INTRAVENOUS | Status: AC
  Filled 2016-12-03: qty 20

## 2016-12-03 MED ORDER — ONDANSETRON HCL 4 MG/2ML IJ SOLN: 4.0000 mg | Freq: Once | INTRAMUSCULAR | Status: DC | PRN

## 2016-12-03 MED ORDER — KCL IN DEXTROSE-NACL 20-5-0.45 MEQ/L-%-% IV SOLN
INTRAVENOUS | Status: DC
  Administered 2016-12-03 – 2016-12-04 (×2): via INTRAVENOUS
  Filled 2016-12-03 (×2): qty 1000

## 2016-12-03 MED ORDER — FENTANYL CITRATE (PF) 100 MCG/2ML IJ SOLN
INTRAMUSCULAR | Status: DC | PRN
  Administered 2016-12-03 (×3): 50 ug via INTRAVENOUS

## 2016-12-03 MED ORDER — ONDANSETRON HCL 4 MG/2ML IJ SOLN
INTRAMUSCULAR | Status: DC | PRN
  Administered 2016-12-03: 4 mg via INTRAVENOUS

## 2016-12-03 MED ORDER — HYDROCODONE-ACETAMINOPHEN 5-325 MG PO TABS
1.0000 | ORAL_TABLET | ORAL | Status: DC | PRN
  Administered 2016-12-03 – 2016-12-04 (×5): 2 via ORAL
  Filled 2016-12-03 (×5): qty 2

## 2016-12-03 MED ORDER — SUGAMMADEX SODIUM 200 MG/2ML IV SOLN
INTRAVENOUS | Status: DC | PRN
  Administered 2016-12-03: 200 mg via INTRAVENOUS

## 2016-12-03 MED ORDER — HYDROMORPHONE HCL 1 MG/ML IJ SOLN: 0.2500 mg | INTRAMUSCULAR | Status: DC | PRN

## 2016-12-03 MED ORDER — LIDOCAINE 2% (20 MG/ML) 5 ML SYRINGE
INTRAMUSCULAR | Status: DC | PRN
  Administered 2016-12-03: 100 mg via INTRAVENOUS

## 2016-12-03 MED ORDER — ACETAMINOPHEN 650 MG RE SUPP: 650.0000 mg | Freq: Four times a day (QID) | RECTAL | Status: DC | PRN

## 2016-12-03 MED ORDER — MIDAZOLAM HCL 2 MG/2ML IJ SOLN
INTRAMUSCULAR | Status: AC
  Filled 2016-12-03: qty 2

## 2016-12-03 MED ORDER — ONDANSETRON HCL 4 MG/2ML IJ SOLN
4.0000 mg | Freq: Four times a day (QID) | INTRAMUSCULAR | Status: DC | PRN
Start: 2016-12-03 — End: 2016-12-03

## 2016-12-03 MED ORDER — SUCCINYLCHOLINE CHLORIDE 200 MG/10ML IV SOSY
PREFILLED_SYRINGE | INTRAVENOUS | Status: DC | PRN
  Administered 2016-12-03: 100 mg via INTRAVENOUS

## 2016-12-03 MED ORDER — BUPIVACAINE HCL (PF) 0.25 % IJ SOLN
INTRAMUSCULAR | Status: DC | PRN
  Administered 2016-12-03: 20 mL

## 2016-12-03 MED ORDER — IOPAMIDOL (ISOVUE-300) INJECTION 61%
INTRAVENOUS | Status: AC
  Filled 2016-12-03: qty 50

## 2016-12-03 MED ORDER — PROPOFOL 10 MG/ML IV BOLUS
INTRAVENOUS | Status: DC | PRN
  Administered 2016-12-03: 150 mg via INTRAVENOUS

## 2016-12-03 MED ORDER — MEPERIDINE HCL 50 MG/ML IJ SOLN: 6.2500 mg | INTRAMUSCULAR | Status: DC | PRN

## 2016-12-03 MED ORDER — SODIUM CHLORIDE 0.9 % IV SOLN
INTRAVENOUS | Status: DC
  Administered 2016-12-03: 03:00:00 via INTRAVENOUS

## 2016-12-03 MED ORDER — ONDANSETRON HCL 4 MG PO TABS: 4.0000 mg | ORAL_TABLET | Freq: Four times a day (QID) | ORAL | Status: DC | PRN

## 2016-12-03 MED ORDER — ALBUTEROL SULFATE (2.5 MG/3ML) 0.083% IN NEBU
2.5000 mg | INHALATION_SOLUTION | RESPIRATORY_TRACT | Status: DC | PRN
  Administered 2016-12-03: 2.5 mg via RESPIRATORY_TRACT
  Filled 2016-12-03: qty 3

## 2016-12-03 MED ORDER — ENOXAPARIN SODIUM 40 MG/0.4ML ~~LOC~~ SOLN: 40.0000 mg | SUBCUTANEOUS | Status: DC

## 2016-12-03 MED ORDER — ACETAMINOPHEN 325 MG PO TABS
650.0000 mg | ORAL_TABLET | Freq: Four times a day (QID) | ORAL | Status: DC | PRN
  Administered 2016-12-03: 650 mg via ORAL
  Filled 2016-12-03: qty 2

## 2016-12-03 MED ORDER — EPHEDRINE SULFATE 50 MG/ML IJ SOLN
INTRAMUSCULAR | Status: DC | PRN
  Administered 2016-12-03: 10 mg via INTRAVENOUS
  Administered 2016-12-03: 15 mg via INTRAVENOUS

## 2016-12-03 MED ORDER — DEXTROSE 5 % IV SOLN
2.0000 g | INTRAVENOUS | Status: DC
  Administered 2016-12-03: 2 g via INTRAVENOUS
  Filled 2016-12-03 (×2): qty 2

## 2016-12-03 MED ORDER — IOPAMIDOL (ISOVUE-300) INJECTION 61%
INTRAVENOUS | Status: DC | PRN
  Administered 2016-12-03: 7 mL

## 2016-12-03 MED ORDER — ONDANSETRON HCL 4 MG/2ML IJ SOLN: 4.0000 mg | Freq: Four times a day (QID) | INTRAMUSCULAR | Status: DC | PRN

## 2016-12-03 MED ORDER — SCOPOLAMINE 1 MG/3DAYS TD PT72
1.0000 | MEDICATED_PATCH | TRANSDERMAL | Status: DC
  Administered 2016-12-03: 1 via TRANSDERMAL

## 2016-12-03 MED ORDER — ROCURONIUM BROMIDE 50 MG/5ML IV SOSY
PREFILLED_SYRINGE | INTRAVENOUS | Status: DC | PRN
  Administered 2016-12-03: 30 mg via INTRAVENOUS

## 2016-12-03 MED ORDER — HYDROMORPHONE HCL 2 MG/ML IJ SOLN
1.0000 mg | INTRAMUSCULAR | Status: DC | PRN
  Administered 2016-12-03 – 2016-12-04 (×5): 1 mg via INTRAVENOUS
  Filled 2016-12-03 (×6): qty 1

## 2016-12-03 MED ORDER — SCOPOLAMINE 1 MG/3DAYS TD PT72
MEDICATED_PATCH | TRANSDERMAL | Status: AC
  Filled 2016-12-03: qty 1

## 2016-12-03 MED ORDER — LACTATED RINGERS IR SOLN
Status: DC | PRN
  Administered 2016-12-03: 1000 mL

## 2016-12-03 MED ORDER — ONDANSETRON 4 MG PO TBDP: 4.0000 mg | ORAL_TABLET | Freq: Four times a day (QID) | ORAL | Status: DC | PRN

## 2016-12-03 MED ORDER — DEXAMETHASONE SODIUM PHOSPHATE 10 MG/ML IJ SOLN
INTRAMUSCULAR | Status: DC | PRN
  Administered 2016-12-03: 10 mg via INTRAVENOUS

## 2016-12-03 MED ORDER — MIDAZOLAM HCL 5 MG/5ML IJ SOLN
INTRAMUSCULAR | Status: DC | PRN
  Administered 2016-12-03 (×2): 1 mg via INTRAVENOUS

## 2016-12-03 MED ORDER — LIDOCAINE 2% (20 MG/ML) 5 ML SYRINGE
INTRAMUSCULAR | Status: AC
  Filled 2016-12-03: qty 5

## 2016-12-03 SURGICAL SUPPLY — 35 items
APL SKNCLS STERI-STRIP NONHPOA (GAUZE/BANDAGES/DRESSINGS) ×1
APPLIER CLIP ROT 10 11.4 M/L (STAPLE) ×2
APR CLP MED LRG 11.4X10 (STAPLE) ×1
BAG SPEC RTRVL LRG 6X4 10 (ENDOMECHANICALS) ×1
BENZOIN TINCTURE PRP APPL 2/3 (GAUZE/BANDAGES/DRESSINGS) ×2 IMPLANT
CABLE HIGH FREQUENCY MONO STRZ (ELECTRODE) ×2 IMPLANT
CHLORAPREP W/TINT 26ML (MISCELLANEOUS) ×4 IMPLANT
CLIP APPLIE ROT 10 11.4 M/L (STAPLE) ×1 IMPLANT
COVER MAYO STAND STRL (DRAPES) ×2 IMPLANT
COVER SURGICAL LIGHT HANDLE (MISCELLANEOUS) ×2 IMPLANT
DECANTER SPIKE VIAL GLASS SM (MISCELLANEOUS) ×2 IMPLANT
DRAPE C-ARM 42X120 X-RAY (DRAPES) ×2 IMPLANT
ELECT REM PT RETURN 9FT ADLT (ELECTROSURGICAL) ×2
ELECTRODE REM PT RTRN 9FT ADLT (ELECTROSURGICAL) ×1 IMPLANT
GAUZE SPONGE 2X2 8PLY STRL LF (GAUZE/BANDAGES/DRESSINGS) ×1 IMPLANT
GLOVE SURG ORTHO 8.0 STRL STRW (GLOVE) ×2 IMPLANT
GOWN STRL REUS W/TWL XL LVL3 (GOWN DISPOSABLE) ×4 IMPLANT
HEMOSTAT SURGICEL 4X8 (HEMOSTASIS) IMPLANT
IRRIG SUCT STRYKERFLOW 2 WTIP (MISCELLANEOUS) ×2
IRRIGATION SUCT STRKRFLW 2 WTP (MISCELLANEOUS) ×1 IMPLANT
KIT BASIN OR (CUSTOM PROCEDURE TRAY) ×2 IMPLANT
POUCH SPECIMEN RETRIEVAL 10MM (ENDOMECHANICALS) ×2 IMPLANT
SCISSORS LAP 5X35 DISP (ENDOMECHANICALS) ×2 IMPLANT
SET CHOLANGIOGRAPH MIX (MISCELLANEOUS) ×2 IMPLANT
SLEEVE XCEL OPT CAN 5 100 (ENDOMECHANICALS) ×2 IMPLANT
SPONGE GAUZE 2X2 STER 10/PKG (GAUZE/BANDAGES/DRESSINGS) ×1
STRIP CLOSURE SKIN 1/2X4 (GAUZE/BANDAGES/DRESSINGS) ×2 IMPLANT
SUT MNCRL AB 4-0 PS2 18 (SUTURE) ×2 IMPLANT
TOWEL OR 17X26 10 PK STRL BLUE (TOWEL DISPOSABLE) ×2 IMPLANT
TOWEL OR NON WOVEN STRL DISP B (DISPOSABLE) ×2 IMPLANT
TRAY LAPAROSCOPIC (CUSTOM PROCEDURE TRAY) ×2 IMPLANT
TROCAR BLADELESS OPT 5 100 (ENDOMECHANICALS) ×2 IMPLANT
TROCAR XCEL BLUNT TIP 100MML (ENDOMECHANICALS) ×2 IMPLANT
TROCAR XCEL NON-BLD 11X100MML (ENDOMECHANICALS) ×2 IMPLANT
TUBING INSUF HEATED (TUBING) IMPLANT

## 2016-12-03 NOTE — H&P (Signed)
History and Physical    Diane [name] ZOX:[id number] DOB: [birth year] DOA: 12/02/2016  Referring MD/NP/PA: Antony Madura, PA-C PCP: Sharlene Dory, DO  Patient coming from:   Chief Complaint: Abdominal pain  HPI: Diane [name] is a 36 y.o. female with medical history significant of migraine headaches, IBS, anxiety, and depression; who presents with acute onset of abdominal pain since 3 PM yesterday afternoon. Patient describes a sharp pain in her right upper quadrant that radiated to her back. Pain was constant and rates it a 10 on the pain scale. Associated symptoms included subjective fever, chills, nausea, nonbloody emesis 5 episodes, and shortness of breath with pain symptoms, and headache. Denies having any chest pain, diarrhea, loss of consciousness, cough, or blood in urine/stools. Patient states that she's never had symptoms like this before in the past. She also notes that she recently had right foot surgery after fracture performed on 10/22/2016 by Dr. Loreta Ave. Patient states that she drinks alcohol more socially and not on a daily basis.  ED Course: Upon admission patient was seen to be afebrile with pulse up to 115, and all other vitals within normal limits. Lab work revealed WBC 13.4, AST 107, ALT 51, no other labs relatively within normal limits. Ultrasound of the abdomen showed suspected large stone or multiple stones within the gallbladder and positive Murphy sign. There was no biliary dilatation or increased wall thickness noted. General surgery was consulted Dr. Wenda Low. [facility] called to admit.  Review of Systems: As per HPI otherwise 10 point review of systems negative.   Past Medical History:  Diagnosis Date  . ADD (attention deficit disorder with hyperactivity)    no current med.  . Anemia    takes iron supplement  . Anxiety   . Cough 10/16/2016  . Depression   . Family history of adverse reaction to anesthesia    pt's father has hx. of being hard to wake up  post-op  . GERD (gastroesophageal reflux disease)   . History of gastric ulcer   . History of kidney stones   . IBS (irritable bowel syndrome)   . Migraines   . Osteoarthritis    right ankle  . Osteochondral lesion 09/2016   right ankle  . Plantar fasciitis of left foot   . PONV (postoperative nausea and vomiting)   . Pseudotumor cerebri   . Wears partial dentures    upper    Past Surgical History:  Procedure Laterality Date  . ANKLE ARTHROSCOPY Right 10/22/2016   Procedure: ANKLE ARTHROSCOPY;  Surgeon: Vivi Barrack, DPM;  Location: [facility];  Service: Podiatry;  Laterality: Right;  GENERAL/REG BLOCK  . BLADDER SURGERY     x 2 - as a child to stretch bladder  . CESAREAN SECTION  12/24/2009  . CESAREAN SECTION  11/20/2011   Procedure: CESAREAN SECTION;  Surgeon: Lenoard Aden, MD;  Location: [facility];  Service: Gynecology;  Laterality: N/A;  . CHROMOPERTUBATION  03/05/2007  . CYSTOSCOPY WITH RETROGRADE PYELOGRAM, URETEROSCOPY AND STENT PLACEMENT Left 08/10/2006  . HARDWARE REMOVAL Right 07/01/2016   Procedure: HARDWARE REMOVAL;  Surgeon: Cammy Copa, MD;  Location: [facility];  Service: Orthopedics;  Laterality: Right;  . HYSTEROSCOPY W/ ENDOMETRIAL ABLATION  03/05/2007  . KNEE ARTHROSCOPY WITH LATERAL RELEASE Right 12/13/2014   Procedure: KNEE ARTHROSCOPY WITH LATERAL RELEASE;  Surgeon: Thera Flake., MD;  Location: [facility];  Service: Orthopedics;  Laterality: Right;  . LAPAROSCOPIC APPENDECTOMY N/A 06/11/2014  Procedure: APPENDECTOMY LAPAROSCOPIC;  Surgeon: Velora Heckler, MD;  Location: [facility];  Service: General;  Laterality: N/A;  . ORIF ANKLE FRACTURE Right 01/05/2016   Procedure: OPEN REDUCTION INTERNAL FIXATION (ORIF) ANKLE FRACTURE;  Surgeon: Cammy Copa, MD;  Location: [facility];  Service: Orthopedics;  Laterality: Right;  . OVARIAN CYST SURGERY Right 03/05/2007  . shunts   2009   tried to do shunts for pseudotumor   . SYNOVECTOMY  Right 12/13/2014   Procedure: PLICA SYNOVECTOMY;  Surgeon: Thera Flake., MD;  Location: [facility];  Service: Orthopedics;  Laterality: Right;  . TONSILLECTOMY AND ADENOIDECTOMY    . WISDOM TOOTH EXTRACTION       reports that she has quit smoking. Her smoking use included Cigarettes. She quit after 15.00 years of use. She has never used smokeless tobacco. She reports that she drinks alcohol. She reports that she does not use drugs.  Allergies  Allergen Reactions  . Nsaids Other (See Comments)    DUE TO HISTORY OF GASTRIC ULCER  . Nickel     "any jewelry that's not real"   . Adhesive [Tape] Rash  . Metoclopramide Hcl Other (See Comments)    RESTLESSNESS/JITTERY  . Prochlorperazine Edisylate Other (See Comments)    RESTLESSNESS/JITTERY     Family History  Problem Relation Age of Onset  . Brain cancer Father   . Anesthesia problems Father     hard to wake up post-op  . Lung cancer Father   . Breast cancer Maternal Grandmother   . Ovarian cancer Maternal Grandmother   . Diabetes Maternal Grandmother   . Heart disease Maternal Grandmother   . Irritable bowel syndrome Mother   . Prostate cancer Maternal Grandfather   . Melanoma Maternal Grandfather     Prior to Admission medications   Medication Sig Start Date End Date Taking? Authorizing Provider  benzonatate (TESSALON) 100 MG capsule Take 1 capsule (100 mg total) by mouth 3 (three) times daily as needed. 11/27/16  Yes Jilda Roche Wendling, DO  diazepam (VALIUM) 5 MG tablet Take 1 tablet (5 mg total) by mouth every 12 (twelve) hours as needed for anxiety. 11/05/16  Yes Nelwyn Salisbury, MD  dicyclomine (BENTYL) 10 MG capsule Take 1 capsule (10 mg total) by mouth 3 (three) times daily before meals. 11/07/16  Yes Meryl Dare, MD  fluticasone (FLONASE) 50 MCG/ACT nasal spray Place 1 spray into both nostrils daily. 09/11/16 09/11/17 Yes Praveen Mannam, MD  gabapentin (NEURONTIN) 300 MG capsule Take 2 capsules  (600 mg total) by mouth 3 (three) times daily. Patient taking differently: Take 600-900 mg by mouth 2 (two) times daily. Take 2 capsules (600 mg) in the morning and Take 3 capsules (900 mg) in the evening. 11/06/16  Yes Nelwyn Salisbury, MD  Magnesium 400 MG TABS Take 1,200 mg by mouth daily.    Yes Historical Provider, MD  NONFORMULARY OR COMPOUNDED ITEM [facility]:  Peripheral Neuropathy Cream - Bupivacaine 1%, Doxepin 3%, Gabapentin 6%, Pentoxifylline 3%, Topiramate 1%, + Diclofenac 3%, apply 1-2 grams to affected area 3-4 times daily. 11/04/16  Yes Vivi Barrack, DPM  oxybutynin (DITROPAN-XL) 10 MG 24 hr tablet Take 1 tablet (10 mg total) by mouth at bedtime. 11/06/16  Yes Nelwyn Salisbury, MD  promethazine (PHENERGAN) 25 MG tablet Take 1 tablet (25 mg total) by mouth every 8 (eight) hours as needed for nausea or vomiting. 10/22/16  Yes Vivi Barrack, DPM  ranitidine (ZANTAC) 150  MG capsule Take 300 mg by mouth 2 (two) times daily.    Yes Historical Provider, MD  tiZANidine (ZANAFLEX) 4 MG tablet Take 4 mg by mouth every 6 (six) hours as needed for muscle spasms.  11/18/16  Yes Historical Provider, MD  traMADol (ULTRAM) 50 MG tablet Take 1 tablet (50 mg total) by mouth every 12 (twelve) hours as needed. Patient taking differently: Take 50 mg by mouth every 12 (twelve) hours as needed for moderate pain or severe pain.  11/25/16  Yes Vivi Barrack, DPM  venlafaxine XR (EFFEXOR-XR) 150 MG 24 hr capsule Take 2 capsules (300 mg total) by mouth daily before breakfast. 11/06/16  Yes Nelwyn Salisbury, MD  Fe-Succ Ac-C-Thre Ac-B12-FA (FERREX 150 FORTE PLUS) 50-100 MG CAPS Take 150 mg by mouth 2 (two) times daily. Patient not taking: Reported on 12/02/2016 11/08/15   Nelwyn Salisbury, MD    Physical Exam:  Constitutional: Overweight female who is in NAD, calm, comfortable at this time Vitals:   12/02/16 1621 12/02/16 2054 12/03/16 0122 12/03/16 0247  BP: 127/88 114/65 104/69 125/82  Pulse: 115 82 78  77  Resp: 18 20 17 18   Temp: 98.8 F (37.1 C)   99.6 F (37.6 C)  TempSrc: Oral   Oral  SpO2: 96% 97% 90% 95%  Weight:      Height:       Eyes: PERRL, lids and conjunctivae normal ENMT: Mucous membranes are dry. Posterior pharynx clear of any exudate or lesions.Normal dentition.  Neck: normal, supple, no masses, no thyromegaly Respiratory: clear to auscultation bilaterally, no wheezing, no crackles. Normal respiratory effort. No accessory muscle use.  Cardiovascular: Regular rate and rhythm, no murmurs / rubs / gallops. No extremity edema. 2+ pedal pulses. No carotid bruits.  Abdomen: Significant tenderness to palpation of the right upper quadrant with positive Murphy sign. Positive bowel sounds appreciated. Musculoskeletal: no clubbing / cyanosis. No joint deformity upper and lower extremities. Good ROM, no contractures. Normal muscle tone.  Skin: Healed surgical incisions on right foot. Neurologic: CN 2-12 grossly intact. Sensation intact, DTR normal. Strength 5/5 in all 4.  Psychiatric: Normal judgment and insight. Alert and oriented x 3. Normal mood.     Labs on Admission: I have personally reviewed following labs and imaging studies  CBC:  Recent Labs Lab 12/02/16 1632  WBC 13.4*  HGB 13.0  HCT 38.7  MCV 87.6  PLT 292   Basic Metabolic Panel:  Recent Labs Lab 12/02/16 1632  NA 137  K 4.1  CL 102  CO2 25  GLUCOSE 110*  BUN 10  CREATININE 0.61  CALCIUM 9.1   GFR: Estimated Creatinine Clearance: 117.5 mL/min (by C-G formula based on SCr of 0.61 mg/dL). Liver Function Tests:  Recent Labs Lab 12/02/16 1632  AST 107*  ALT 51  ALKPHOS 126  BILITOT 0.2*  PROT 7.5  ALBUMIN 4.3    Recent Labs Lab 12/02/16 1632  LIPASE 20   No results for input(s): AMMONIA in the last 168 hours. Coagulation Profile: No results for input(s): INR, PROTIME in the last 168 hours. Cardiac Enzymes: No results for input(s): CKTOTAL, CKMB, CKMBINDEX, TROPONINI in the last  168 hours. BNP (last 3 results) No results for input(s): PROBNP in the last 8760 hours. HbA1C: No results for input(s): HGBA1C in the last 72 hours. CBG: No results for input(s): GLUCAP in the last 168 hours. Lipid Profile: No results for input(s): CHOL, HDL, LDLCALC, TRIG, CHOLHDL, LDLDIRECT in the last 72 hours.  Thyroid Function Tests: No results for input(s): TSH, T4TOTAL, FREET4, T3FREE, THYROIDAB in the last 72 hours. Anemia Panel: No results for input(s): VITAMINB12, FOLATE, FERRITIN, TIBC, IRON, RETICCTPCT in the last 72 hours. Urine analysis:    Component Value Date/Time   COLORURINE YELLOW 12/02/2016 1620   APPEARANCEUR CLEAR 12/02/2016 1620   LABSPEC 1.012 12/02/2016 1620   PHURINE 7.0 12/02/2016 1620   GLUCOSEU NEGATIVE 12/02/2016 1620   HGBUR NEGATIVE 12/02/2016 1620   HGBUR negative 06/16/2008 1552   BILIRUBINUR NEGATIVE 12/02/2016 1620   KETONESUR NEGATIVE 12/02/2016 1620   PROTEINUR NEGATIVE 12/02/2016 1620   UROBILINOGEN 0.2 06/11/2014 1943   NITRITE NEGATIVE 12/02/2016 1620   LEUKOCYTESUR NEGATIVE 12/02/2016 1620   Sepsis Labs: No results found for this or any previous visit (from the past 240 hour(s)).   Radiological Exams on Admission: [place] Abdomen Complete  Result Date: 12/02/2016 CLINICAL DATA:  Right upper quadrant pain to the right flank EXAM: ABDOMEN ULTRASOUND COMPLETE COMPARISON:  CT 06/11/2014 FINDINGS: Gallbladder: Contracted. Echogenic arc with dense posterior shadowing compatible with large stone or multiple stones. Sonographer reports positive sonographic Murphy's. Normal wall thickness at 2.6 mm Common bile duct: Diameter: Normal at 3.5 mm Liver: Heterogeneous with areas of increased echogenicity suggesting fatty infiltration. No focal hepatic abnormality. IVC: No abnormality visualized. Pancreas: Visualized portion unremarkable. Spleen: Size and appearance within normal limits. Right Kidney: Length: 12.1. Echogenicity within normal limits. No mass or  hydronephrosis visualized. Left Kidney: Length: 10 point for. Echogenicity within normal limits. No mass or hydronephrosis visualized. Abdominal aorta: Obscured by bowel gas Other findings: None. IMPRESSION: 1. Suspect large stone or multiple stones in the gallbladder. Sonographer reports a positive sonographic Murphy's sign, however wall thickness is normal ; cholecystitis cannot be excluded. No biliary dilatation. 2. Heterogenous echotexture of the liver suggests fatty infiltration. Electronically Signed   By: Jasmine PangKim  Fujinaga M.D.   On: 12/02/2016 21:50     Assessment/Plan Suspected cholelithiasis and acute cholecystitis without obstruction acute. Patient presents with right upper quadrant pain. As seen on ultrasound.:  - Admit to a MedSurg bed - NPO - IV fluids normal saline at 17500ml/hr - Pain control with Dilaudid IV - Gen. surgery to eval in a.m. Discussed with Dr. Wenda LowMatt Martin  Nausea and vomiting - Zofran prn   Elevated AST: Acute. Initial AST elevated 107 - Continue to monitor  DVT prophylaxis: lovenox Code Status: Full Family Communication: No family present at bedside Disposition Plan: Likely discharge home once medically stable Consults called: General surgery  Admission status: observation Clydie Braunondell A Smith MD [facility] Pager [phone]  If 7PM-7AM, please contact night-coverage [email or web address] Password [email or web address]  12/03/2016, 2:57 AM

## 2016-12-03 NOTE — Op Note (Signed)
Procedure Note  Pre-operative Diagnosis:  Acute cholecystitis, cholelithiasis, abnormal LFT's  Post-operative Diagnosis:  same  Surgeon:  Velora Hecklerodd M. Hy Swiatek, MD, FACS  Assistant:  none   Procedure:  Laparoscopic cholecystectomy with intra-operative cholangiography  Anesthesia:  General  Estimated Blood Loss:  minimal  Drains: none         Specimen: Gallbladder to pathology  Indications:  Patient presents complaining of right flank pain, right upper quadrant pain starting the afternoon of admission.  She has chronic difficulty urinating, worse last  2 weeks. She reported pain 10 over 10 she has a history of kidney stones and denied diarrhea. Pain was worse with certain movements. She reported 5 episodes of nausea and vomiting.  Workup in the emergency department MAXIMUM TEMPERATURE 99.7. Vital signs are stable. Labs show white count of 9000 hemoglobin and hematocrit are 11.4/34.9. Platelets are normal AST 314, ALT 217 bilirubin is 0.3 electrolytes are normal creatinine is 0.62. AST and ALT are elevated from yesterday afternoon when they were essentially normal. Abdominal ultrasound completed last BM shows gallbladder is contracted with echogenic arc with dense posterior shadowing compatible with large stones or multiple stones. Normal wall thickness of 2.6 mm. Common bile duct was normal at 3.5 mm. This is compatible with larger multiple gallstones in the gallbladder positive sonographic Murphy's sign.  Procedure Details:  The patient was seen in the pre-op holding area. The risks, benefits, complications, treatment options, and expected outcomes were previously discussed with the patient. The patient agreed with the proposed plan and has signed the informed consent form.  The patient was brought to the Operating Room, identified as Marin Robertsshley S Southers and the procedure verified as laparoscopic cholecystectomy with intraoperative cholangiography. A "time out" was completed and the above  information confirmed.  The patient was placed in the supine position. Following induction of general anesthesia, the abdomen was prepped and draped in the usual aseptic fashion.  An incision was made in the skin near the umbilicus. The midline fascia was incised and the peritoneal cavity was entered and a Hasson canula was introduced under direct vision.  The Hasson canula was secured with a 0-Vicryl pursestring suture. Pneumoperitoneum was established with carbon dioxide. Additional trocars were introduced under direct vision along the right costal margin in the midline, mid-clavicular line, and anterior axillary line.   The gallbladder was identified and the fundus grasped and retracted cephalad. Adhesions were taken down bluntly and the electrocautery was utilized as needed, taking care not to injure any adjacent structures. The infundibulum was grasped and retracted laterally, exposing the peritoneum overlying the triangle of Calot. The peritoneum was incised and structures exposed with blunt dissection. The cystic duct was clearly identified, bluntly dissected circumferentially, and clipped at the neck of the gallbladder.  An incision was made in the cystic duct and the cholangiogram catheter introduced. The catheter was secured using an ligaclip.  Real-time cholangiography was performed using C-arm fluoroscopy.  There was rapid filling of a normal caliber common bile duct.  There was reflux of contrast into the left and right hepatic ductal systems.  There was free flow distally into the duodenum without filling defect or obstruction.  The catheter was removed from the peritoneal cavity.  The cystic duct was then ligated with surgical clips and divided. The cystic artery was identified, dissected circumferentially, ligated with ligaclips, and divided.  The gallbladder was dissected away from the liver bed using the electrocautery for hemostasis. The gallbladder was completely removed from the  liver and placed  into an endocatch bag. The gallbladder was removed in the endocatch bag through the umbilical port site and submitted to pathology for review.  The right upper quadrant was irrigated and the gallbladder bed was inspected. Hemostasis was achieved with the electrocautery.  Pneumoperitoneum was released after viewing removal of the trocars with good hemostasis noted. The umbilical wound was irrigated and the fascia was then closed with the pursestring suture.  Local anesthetic was infiltrated at all port sites. The skin incisions were closed with 4-0 Monocril subcuticular sutures and steri-strips and dressings were applied.  Instrument, sponge, and needle counts were correct at the conclusion of the case.  The patient was awakened from anesthesia and brought to the recovery room in stable condition.  The patient tolerated the procedure well.   Velora Heckler, MD, [facility] Office: [phone]

## 2016-12-03 NOTE — Progress Notes (Signed)
PROGRESS NOTE    Marin Robertsshley S Wever  NFA:[id number]:[id number] DOB: 08/05/1981 DOA: 12/02/2016 PCP: Sharlene DoryNicholas Paul Wendling, DO    Brief Narrative:  36 y.o. female with medical history significant of migraine headaches, IBS, anxiety, and depression; who presents with acute onset of abdominal pain since 3 PM yesterday afternoon. Patient describes a sharp pain in her right upper quadrant that radiated to her back. Pain was constant and rates it a 10 on the pain scale. Associated symptoms included subjective fever, chills, nausea, nonbloody emesis 5 episodes, and shortness of breath with pain symptoms, and headache. Denies having any chest pain, diarrhea, loss of consciousness, cough, or blood in urine/stools. Patient states that she's never had symptoms like this before in the past. She also notes that she recently had right foot surgery after fracture performed on 10/22/2016 by Dr. Loreta AveWagner. Patient states that she drinks alcohol more socially and not on a daily basis  Upon admission patient was seen to be afebrile with pulse up to 115, and all other vitals within normal limits. Lab work revealed WBC 13.4, AST 107, ALT 51, no other labs relatively within normal limits. Ultrasound of the abdomen showed suspected large stone or multiple stones within the gallbladder and positive Murphy sign. There was no biliary dilatation or increased wall thickness noted. General surgery was consulted Dr. Wenda LowMatt Martin. [facility] called to admit.  Assessment & Plan:   Principal Problem:   Cholelithiasis and acute cholecystitis without obstruction Active Problems:   Nausea and vomiting  Suspected cholelithiasis and acute cholecystitis without obstruction acute.  - Patient presents with right upper quadrant pain. - RUQ US reviewed. Gallstones noted on imaging - General Surgery consulted - Patient has undergone lap chole as of 1/17 - Continue supportive care - Repeat CMP in AM  Nausea and vomiting - suspect secondary to biliary  disease -continue anti-emetics as needed  Elevated AST: Acute. Initial AST elevated 107 - suspect secondary to above biliary disease - Repeat cmp in AM  DVT prophylaxis: SCD's Code Status: Full Family Communication: Pt in room Disposition Plan: Uncertain at this time  Consultants:   General Surgery  Procedures:   Lap chole 1/17  Antimicrobials: Anti-infectives    Start     Dose/Rate Route Frequency Ordered Stop   12/03/16 0930  cefTRIAXone (ROCEPHIN) 2 g in dextrose 5 % 50 mL IVPB  Status:  Discontinued     2 g 100 mL/hr over 30 Minutes Intravenous Every 24 hours 12/03/16 0810 12/03/16 1256       Subjective: Reports mild post-op abd soreness  Objective: Vitals:   12/03/16 1215 12/03/16 1230 12/03/16 1345 12/03/16 1427  BP: 99/60 (!) 101/57 108/62 110/63  Pulse: 73 74 73 72  Resp: 14 15 20 15   Temp: 98.2 F (36.8 C) 98.4 F (36.9 C) 98.5 F (36.9 C) 98.7 F (37.1 C)  TempSrc:   Axillary Oral  SpO2: 95% 95% 95% 98%  Weight:      Height:        Intake/Output Summary (Last 24 hours) at 12/03/16 1551 Last data filed at 12/03/16 1400  Gross per 24 hour  Intake             1425 ml  Output              700 ml  Net              725 ml   Filed Weights   12/02/16 1620  Weight: 95.3 kg (210 lb)  Examination:  General exam: Appears calm and comfortable  Respiratory system: Clear to auscultation. Respiratory effort normal. Cardiovascular system: S1 & S2 heard, RRR Gastrointestinal system: Abdomen is nondistended, soft and nontender. No organomegaly or masses felt. Normal bowel sounds heard. Central nervous system: Alert and oriented. No focal neurological deficits. Extremities: Symmetric 5 x 5 power. Skin: No rashes, lesions Psychiatry: Judgement and insight appear normal. Mood & affect appropriate.   Data Reviewed: I have personally reviewed following labs and imaging studies  CBC:  Recent Labs Lab 12/02/16 1632 12/03/16 0446  WBC 13.4* 9.0  HGB  13.0 11.4*  HCT 38.7 34.9*  MCV 87.6 87.5  PLT 292 227   Basic Metabolic Panel:  Recent Labs Lab 12/02/16 1632 12/03/16 0500  NA 137 135  K 4.1 3.9  CL 102 103  CO2 25 28  GLUCOSE 110* 99  BUN 10 8  CREATININE 0.61 0.62  CALCIUM 9.1 8.1*   GFR: Estimated Creatinine Clearance: 117.5 mL/min (by C-G formula based on SCr of 0.62 mg/dL). Liver Function Tests:  Recent Labs Lab 12/02/16 1632 12/03/16 0500  AST 107* 314*  ALT 51 217*  ALKPHOS 126 171*  BILITOT 0.2* 0.3  PROT 7.5 6.0*  ALBUMIN 4.3 3.5    Recent Labs Lab 12/02/16 1632  LIPASE 20   No results for input(s): AMMONIA in the last 168 hours. Coagulation Profile:  Recent Labs Lab 12/03/16 0500  INR 0.98   Cardiac Enzymes: No results for input(s): CKTOTAL, CKMB, CKMBINDEX, TROPONINI in the last 168 hours. BNP (last 3 results) No results for input(s): PROBNP in the last 8760 hours. HbA1C: No results for input(s): HGBA1C in the last 72 hours. CBG: No results for input(s): GLUCAP in the last 168 hours. Lipid Profile: No results for input(s): CHOL, HDL, LDLCALC, TRIG, CHOLHDL, LDLDIRECT in the last 72 hours. Thyroid Function Tests: No results for input(s): TSH, T4TOTAL, FREET4, T3FREE, THYROIDAB in the last 72 hours. Anemia Panel: No results for input(s): VITAMINB12, FOLATE, FERRITIN, TIBC, IRON, RETICCTPCT in the last 72 hours. Sepsis Labs: No results for input(s): PROCALCITON, LATICACIDVEN in the last 168 hours.  No results found for this or any previous visit (from the past 240 hour(s)).   Radiology Studies: Dg Cholangiogram Operative  Result Date: 12/03/2016 CLINICAL DATA:  Right upper quadrant pain EXAM: INTRAOPERATIVE CHOLANGIOGRAM TECHNIQUE: Cholangiographic images from the C-arm fluoroscopic device were submitted for interpretation post-operatively. Please see the procedural report for the amount of contrast and the fluoroscopy time utilized. COMPARISON:  None. FINDINGS: Contrast fills the  biliary tree and duodenum without filling defects in the common bile duct. IMPRESSION: Patent biliary tree. Electronically Signed   By: Jolaine Click M.D.   On: 12/03/2016 11:00   US Abdomen Complete  Result Date: 12/02/2016 CLINICAL DATA:  Right upper quadrant pain to the right flank EXAM: ABDOMEN ULTRASOUND COMPLETE COMPARISON:  CT 06/11/2014 FINDINGS: Gallbladder: Contracted. Echogenic arc with dense posterior shadowing compatible with large stone or multiple stones. Sonographer reports positive sonographic Murphy's. Normal wall thickness at 2.6 mm Common bile duct: Diameter: Normal at 3.5 mm Liver: Heterogeneous with areas of increased echogenicity suggesting fatty infiltration. No focal hepatic abnormality. IVC: No abnormality visualized. Pancreas: Visualized portion unremarkable. Spleen: Size and appearance within normal limits. Right Kidney: Length: 12.1. Echogenicity within normal limits. No mass or hydronephrosis visualized. Left Kidney: Length: 10 point for. Echogenicity within normal limits. No mass or hydronephrosis visualized. Abdominal aorta: Obscured by bowel gas Other findings: None. IMPRESSION: 1. Suspect large stone or  multiple stones in the gallbladder. Sonographer reports a positive sonographic Murphy's sign, however wall thickness is normal ; cholecystitis cannot be excluded. No biliary dilatation. 2. Heterogenous echotexture of the liver suggests fatty infiltration. Electronically Signed   By: Jasmine Pang M.D.   On: 12/02/2016 21:50    Scheduled Meds: Continuous Infusions: . dextrose 5 % and 0.45 % NaCl with KCl 20 mEq/L       LOS: 0 days   Mathews Stuhr, Scheryl Marten, MD [facility] Pager [phone]  If 7PM-7AM, please contact night-coverage [email or web address] Password [facility] 12/03/2016, 3:51 PM

## 2016-12-03 NOTE — Anesthesia Preprocedure Evaluation (Addendum)
Anesthesia Evaluation    History of Anesthesia Complications (+) PONV, Family history of anesthesia reaction and history of anesthetic complications  Airway Mallampati: II  TM Distance: >3 FB Neck ROM: Full    Dental no notable dental hx. (+) Partial Upper   Pulmonary Recent URI , former smoker,  Laryngitis for last 3 days, hoarse voice   Pulmonary exam normal breath sounds clear to auscultation       Cardiovascular negative cardio ROS Normal cardiovascular exam Rhythm:Regular Rate:Normal     Neuro/Psych  Headaches, PSYCHIATRIC DISORDERS Anxiety Depression ADDPseudotumor cerebri with benign intracranial hypertension    GI/Hepatic PUD, GERD  Medicated and Controlled,(+)     substance abuse  , Hx/o narcotic abuse Cholelithiasis- symptomatic IBS   Endo/Other  Obesity  Renal/GU negative Renal ROS  negative genitourinary   Musculoskeletal  (+) Arthritis , Osteoarthritis,    Abdominal Normal abdominal exam  (+)   Peds  Hematology  (+) anemia ,   Anesthesia Other Findings   Reproductive/Obstetrics                           Lab Results  Component Value Date   WBC 9.0 12/03/2016   HGB 11.4 (L) 12/03/2016   HCT 34.9 (L) 12/03/2016   MCV 87.5 12/03/2016   PLT 227 12/03/2016     Chemistry      Component Value Date/Time   NA 135 12/03/2016 0500   K 3.9 12/03/2016 0500   CL 103 12/03/2016 0500   CO2 28 12/03/2016 0500   BUN 8 12/03/2016 0500   CREATININE 0.62 12/03/2016 0500      Component Value Date/Time   CALCIUM 8.1 (L) 12/03/2016 0500   ALKPHOS 171 (H) 12/03/2016 0500   AST 314 (H) 12/03/2016 0500   ALT 217 (H) 12/03/2016 0500   BILITOT 0.3 12/03/2016 0500      Anesthesia Physical Anesthesia Plan  ASA: II  Anesthesia Plan: General   Post-op Pain Management:    Induction: Intravenous, Rapid sequence and Cricoid pressure planned  Airway Management Planned: Oral  ETT  Additional Equipment:   Intra-op Plan:   Post-operative Plan: Extubation in OR  Informed Consent: I have reviewed the patients History and Physical, chart, labs and discussed the procedure including the risks, benefits and alternatives for the proposed anesthesia with the patient or authorized representative who has indicated his/her understanding and acceptance.   Dental advisory given  Plan Discussed with: CRNA, Anesthesiologist and Surgeon  Anesthesia Plan Comments:         Anesthesia Quick Evaluation

## 2016-12-03 NOTE — Consult Note (Signed)
Reason for Consult:cholecystitis Referring Physician: Dr. Rhetta Mura  Diane [name] is an 36 y.o. female.  HPI: Patient presents complaining of right flank pain, right upper quadrant pain starting the afternoon of admission.  She has chronic difficulty urinating, worse last  2 weeks. She reported pain 10 over 10 she has a history of kidney stones and denied diarrhea. Pain was worse with certain movements. She reported 5 episodes of nausea and vomiting.  Workup in the emergency department MAXIMUM TEMPERATURE 99.7. Vital signs are stable. Labs show white count of 9000 hemoglobin and hematocrit are 11.4/34.9. Platelets are normal AST 314, ALT 217 bilirubin is 0.3 electrolytes are normal creatinine is 0.62. AST and ALT are elevated from yesterday afternoon when they were essentially normal. Abdominal ultrasound completed last BM shows gallbladder is contracted with echogenic arc with dense posterior shadowing compatible with large stones or multiple stones. Normal wall thickness of 2.6 mm. Common bile duct was normal at 3.5 mm. This is compatible with larger multiple gallstones in the gallbladder positive sonographic Murphy's sign.  We are asked to see.  Past Medical History:  Diagnosis Date  . ADD (attention deficit disorder with hyperactivity)    no current med.  . Anemia    takes iron supplement  . Anxiety   . Cough 10/16/2016  . Depression   . Family history of adverse reaction to anesthesia    pt's father has hx. of being hard to wake up post-op  . GERD (gastroesophageal reflux disease)   . History of gastric ulcer   . History of kidney stones   . IBS (irritable bowel syndrome)   . Migraines   . Osteoarthritis    right ankle  . Osteochondral lesion 09/2016   right ankle  . Plantar fasciitis of left foot   . PONV (postoperative nausea and vomiting)   . Pseudotumor cerebri   . Wears partial dentures    upper    Past Surgical History:  Procedure Laterality Date  . ANKLE  ARTHROSCOPY Right 10/22/2016   Procedure: ANKLE ARTHROSCOPY;  Surgeon: Trula Slade, DPM;  Location: [place] [facility];  Service: Podiatry;  Laterality: Right;  GENERAL/REG BLOCK  . BLADDER SURGERY     x 2 - as a child to stretch bladder  . CESAREAN SECTION  12/24/2009  . CESAREAN SECTION  11/20/2011   Procedure: CESAREAN SECTION;  Surgeon: Lovenia Kim, MD;  Location: [facility];  Service: Gynecology;  Laterality: N/A;  . CHROMOPERTUBATION  03/05/2007  . CYSTOSCOPY WITH RETROGRADE PYELOGRAM, URETEROSCOPY AND STENT PLACEMENT Left 08/10/2006  . HARDWARE REMOVAL Right 07/01/2016   Procedure: HARDWARE REMOVAL;  Surgeon: Meredith Pel, MD;  Location: [facility];  Service: Orthopedics;  Laterality: Right;  . HYSTEROSCOPY W/ ENDOMETRIAL ABLATION  03/05/2007  . KNEE ARTHROSCOPY WITH LATERAL RELEASE Right 12/13/2014   Procedure: KNEE ARTHROSCOPY WITH LATERAL RELEASE;  Surgeon: Yvette Rack., MD;  Location: [place];  Service: Orthopedics;  Laterality: Right;  . LAPAROSCOPIC APPENDECTOMY N/A 06/11/2014   Procedure: APPENDECTOMY LAPAROSCOPIC;  Surgeon: Earnstine Regal, MD;  Location: [facility];  Service: General;  Laterality: N/A;  . ORIF ANKLE FRACTURE Right 01/05/2016   Procedure: OPEN REDUCTION INTERNAL FIXATION (ORIF) ANKLE FRACTURE;  Surgeon: Meredith Pel, MD;  Location: [place];  Service: Orthopedics;  Laterality: Right;  . OVARIAN CYST SURGERY Right 03/05/2007  . shunts   2009   tried to do shunts for pseudotumor   . SYNOVECTOMY Right 12/13/2014  Procedure: PLICA SYNOVECTOMY;  Surgeon: Yvette Rack., MD;  Location: [place];  Service: Orthopedics;  Laterality: Right;  . TONSILLECTOMY AND ADENOIDECTOMY    . WISDOM TOOTH EXTRACTION      Family History  Problem Relation Age of Onset  . Brain cancer Father   . Anesthesia problems Father     hard to wake up post-op  . Lung cancer Father   . Breast cancer Maternal Grandmother   . Ovarian cancer Maternal  Grandmother   . Diabetes Maternal Grandmother   . Heart disease Maternal Grandmother   . Irritable bowel syndrome Mother   . Prostate cancer Maternal Grandfather   . Melanoma Maternal Grandfather     Social History:  reports that she has quit smoking. Her smoking use included Cigarettes. She quit after 15.00 years of use. She has never used smokeless tobacco. She reports that she drinks alcohol. She reports that she does not use drugs.  Allergies:  Allergies  Allergen Reactions  . Nsaids Other (See Comments)    DUE TO HISTORY OF GASTRIC ULCER  . Nickel     "any jewelry that's not real"   . Adhesive [Tape] Rash  . Metoclopramide Hcl Other (See Comments)    RESTLESSNESS/JITTERY  . Prochlorperazine Edisylate Other (See Comments)    RESTLESSNESS/JITTERY     Prior to Admission medications   Medication Sig Start Date End Date Taking? Authorizing Provider  benzonatate (TESSALON) 100 MG capsule Take 1 capsule (100 mg total) by mouth 3 (three) times daily as needed. 11/27/16  Yes Crosby Oyster Wendling, DO  diazepam (VALIUM) 5 MG tablet Take 1 tablet (5 mg total) by mouth every 12 (twelve) hours as needed for anxiety. 11/05/16  Yes Laurey Morale, MD  dicyclomine (BENTYL) 10 MG capsule Take 1 capsule (10 mg total) by mouth 3 (three) times daily before meals. 11/07/16  Yes Ladene Artist, MD  fluticasone (FLONASE) 50 MCG/ACT nasal spray Place 1 spray into both nostrils daily. 09/11/16 09/11/17 Yes Praveen Mannam, MD  gabapentin (NEURONTIN) 300 MG capsule Take 2 capsules (600 mg total) by mouth 3 (three) times daily. Patient taking differently: Take 600-900 mg by mouth 2 (two) times daily. Take 2 capsules (600 mg) in the morning and Take 3 capsules (900 mg) in the evening. 11/06/16  Yes Laurey Morale, MD  Magnesium 400 MG TABS Take 1,200 mg by mouth daily.    Yes Historical Provider, MD  NONFORMULARY OR COMPOUNDED ITEM [facility]:  Peripheral Neuropathy Cream - Bupivacaine 1%, Doxepin  3%, Gabapentin 6%, Pentoxifylline 3%, Topiramate 1%, + Diclofenac 3%, apply 1-2 grams to affected area 3-4 times daily. 11/04/16  Yes Trula Slade, DPM  oxybutynin (DITROPAN-XL) 10 MG 24 hr tablet Take 1 tablet (10 mg total) by mouth at bedtime. 11/06/16  Yes Laurey Morale, MD  promethazine (PHENERGAN) 25 MG tablet Take 1 tablet (25 mg total) by mouth every 8 (eight) hours as needed for nausea or vomiting. 10/22/16  Yes Trula Slade, DPM  ranitidine (ZANTAC) 150 MG capsule Take 300 mg by mouth 2 (two) times daily.    Yes Historical Provider, MD  tiZANidine (ZANAFLEX) 4 MG tablet Take 4 mg by mouth every 6 (six) hours as needed for muscle spasms.  11/18/16  Yes Historical Provider, MD  traMADol (ULTRAM) 50 MG tablet Take 1 tablet (50 mg total) by mouth every 12 (twelve) hours as needed. Patient taking differently: Take 50 mg by mouth every 12 (twelve) hours  as needed for moderate pain or severe pain.  11/25/16  Yes Trula Slade, DPM  venlafaxine XR (EFFEXOR-XR) 150 MG 24 hr capsule Take 2 capsules (300 mg total) by mouth daily before breakfast. 11/06/16  Yes Laurey Morale, MD  Fe-Succ Ac-C-Thre Ac-B12-FA (FERREX 150 FORTE PLUS) 50-100 MG CAPS Take 150 mg by mouth 2 (two) times daily. Patient not taking: Reported on 12/02/2016 11/08/15   Laurey Morale, MD     Results for orders placed or performed during the hospital encounter of 12/02/16 (from the past 48 hour(s))  Urinalysis, Routine w reflex microscopic     Status: None   Collection Time: 12/02/16  4:20 PM  Result Value Ref Range   Color, Urine YELLOW YELLOW   APPearance CLEAR CLEAR   Specific Gravity, Urine 1.012 1.005 - 1.030   pH 7.0 5.0 - 8.0   Glucose, UA NEGATIVE NEGATIVE mg/dL   Hgb urine dipstick NEGATIVE NEGATIVE   Bilirubin Urine NEGATIVE NEGATIVE   Ketones, ur NEGATIVE NEGATIVE mg/dL   Protein, ur NEGATIVE NEGATIVE mg/dL   Nitrite NEGATIVE NEGATIVE   Leukocytes, UA NEGATIVE NEGATIVE  Pregnancy, urine     Status: None    Collection Time: 12/02/16  4:20 PM  Result Value Ref Range   Preg Test, Ur NEGATIVE NEGATIVE    Comment:        THE SENSITIVITY OF THIS METHODOLOGY IS >20 mIU/mL.   Lipase, blood     Status: None   Collection Time: 12/02/16  4:32 PM  Result Value Ref Range   Lipase 20 11 - 51 U/L  Comprehensive metabolic panel     Status: Abnormal   Collection Time: 12/02/16  4:32 PM  Result Value Ref Range   Sodium 137 135 - 145 mmol/L   Potassium 4.1 3.5 - 5.1 mmol/L   Chloride 102 101 - 111 mmol/L   CO2 25 22 - 32 mmol/L   Glucose, Bld 110 (H) 65 - 99 mg/dL   BUN 10 6 - 20 mg/dL   Creatinine, Ser 0.61 0.44 - 1.00 mg/dL   Calcium 9.1 8.9 - 10.3 mg/dL   Total Protein 7.5 6.5 - 8.1 g/dL   Albumin 4.3 3.5 - 5.0 g/dL   AST 107 (H) 15 - 41 U/L   ALT 51 14 - 54 U/L   Alkaline Phosphatase 126 38 - 126 U/L   Total Bilirubin 0.2 (L) 0.3 - 1.2 mg/dL   GFR calc non Af Amer >60 >60 mL/min   GFR calc Af Amer >60 >60 mL/min    Comment: (NOTE) The eGFR has been calculated using the CKD EPI equation. This calculation has not been validated in all clinical situations. eGFR's persistently <60 mL/min signify possible Chronic Kidney Disease.    Anion gap 10 5 - 15  CBC     Status: Abnormal   Collection Time: 12/02/16  4:32 PM  Result Value Ref Range   WBC 13.4 (H) 4.0 - 10.5 K/uL   RBC 4.42 3.87 - 5.11 MIL/uL   Hemoglobin 13.0 12.0 - 15.0 g/dL   HCT 38.7 36.0 - 46.0 %   MCV 87.6 78.0 - 100.0 fL   MCH 29.4 26.0 - 34.0 pg   MCHC 33.6 30.0 - 36.0 g/dL   RDW 12.5 11.5 - 15.5 %   Platelets 292 150 - 400 K/uL  I-Stat beta hCG blood, ED     Status: Abnormal   Collection Time: 12/02/16  4:42 PM  Result Value Ref Range  I-stat hCG, quantitative 5.7 (H) <5 mIU/mL   Comment 3            Comment:   GEST. AGE      CONC.  (mIU/mL)   <=1 WEEK        5 - 50     2 WEEKS       50 - 500     3 WEEKS       100 - 10,000     4 WEEKS     1,000 - 30,000        FEMALE AND NON-PREGNANT FEMALE:     LESS THAN 5  mIU/mL   CBC     Status: Abnormal   Collection Time: 12/03/16  4:46 AM  Result Value Ref Range   WBC 9.0 4.0 - 10.5 K/uL   RBC 3.99 3.87 - 5.11 MIL/uL   Hemoglobin 11.4 (L) 12.0 - 15.0 g/dL   HCT 34.9 (L) 36.0 - 46.0 %   MCV 87.5 78.0 - 100.0 fL   MCH 28.6 26.0 - 34.0 pg   MCHC 32.7 30.0 - 36.0 g/dL   RDW 12.4 11.5 - 15.5 %   Platelets 227 150 - 400 K/uL  Comprehensive metabolic panel     Status: Abnormal   Collection Time: 12/03/16  5:00 AM  Result Value Ref Range   Sodium 135 135 - 145 mmol/L   Potassium 3.9 3.5 - 5.1 mmol/L   Chloride 103 101 - 111 mmol/L   CO2 28 22 - 32 mmol/L   Glucose, Bld 99 65 - 99 mg/dL   BUN 8 6 - 20 mg/dL   Creatinine, Ser 0.62 0.44 - 1.00 mg/dL   Calcium 8.1 (L) 8.9 - 10.3 mg/dL   Total Protein 6.0 (L) 6.5 - 8.1 g/dL   Albumin 3.5 3.5 - 5.0 g/dL   AST 314 (H) 15 - 41 U/L   ALT 217 (H) 14 - 54 U/L   Alkaline Phosphatase 171 (H) 38 - 126 U/L   Total Bilirubin 0.3 0.3 - 1.2 mg/dL   GFR calc non Af Amer >60 >60 mL/min   GFR calc Af Amer >60 >60 mL/min    Comment: (NOTE) The eGFR has been calculated using the CKD EPI equation. This calculation has not been validated in all clinical situations. eGFR's persistently <60 mL/min signify possible Chronic Kidney Disease.    Anion gap 4 (L) 5 - 15  Protime-INR     Status: None   Collection Time: 12/03/16  5:00 AM  Result Value Ref Range   Prothrombin Time 13.0 11.4 - 15.2 seconds   INR 0.98   APTT     Status: None   Collection Time: 12/03/16  5:00 AM  Result Value Ref Range   aPTT 25 24 - 36 seconds    US Abdomen Complete  Result Date: 12/02/2016 CLINICAL DATA:  Right upper quadrant pain to the right flank EXAM: ABDOMEN ULTRASOUND COMPLETE COMPARISON:  CT 06/11/2014 FINDINGS: Gallbladder: Contracted. Echogenic arc with dense posterior shadowing compatible with large stone or multiple stones. Sonographer reports positive sonographic Murphy's. Normal wall thickness at 2.6 mm Common bile duct:  Diameter: Normal at 3.5 mm Liver: Heterogeneous with areas of increased echogenicity suggesting fatty infiltration. No focal hepatic abnormality. IVC: No abnormality visualized. Pancreas: Visualized portion unremarkable. Spleen: Size and appearance within normal limits. Right Kidney: Length: 12.1. Echogenicity within normal limits. No mass or hydronephrosis visualized. Left Kidney: Length: 10 point for. Echogenicity within normal limits. No mass or  hydronephrosis visualized. Abdominal aorta: Obscured by bowel gas Other findings: None. IMPRESSION: 1. Suspect large stone or multiple stones in the gallbladder. Sonographer reports a positive sonographic Murphy's sign, however wall thickness is normal ; cholecystitis cannot be excluded. No biliary dilatation. 2. Heterogenous echotexture of the liver suggests fatty infiltration. Electronically Signed   By: Donavan Foil M.D.   On: 12/02/2016 21:50    Review of Systems  Constitutional: Positive for fever. Negative for chills, malaise/fatigue and weight loss.  HENT: Negative.   Eyes: Negative.   Respiratory: Positive for cough. Negative for hemoptysis, sputum production, shortness of breath and wheezing.        She has a cold/flu  Cardiovascular: Positive for orthopnea and leg swelling. Negative for chest pain, palpitations and claudication.  Gastrointestinal: Positive for abdominal pain (RUQ pain acute and ongoing since last PM), heartburn, nausea and vomiting. Negative for blood in stool, constipation, diarrhea and melena.  Genitourinary: Positive for flank pain.       Long term issues with voiding on Ditropan  Musculoskeletal: Positive for back pain. Negative for falls, joint pain, myalgias and neck pain.       RLE neuralgia  Skin: Negative.   Neurological: Positive for headaches (Migraines, having one now). Negative for dizziness, tingling, tremors, sensory change, speech change, focal weakness, seizures, loss of consciousness and weakness.   Endo/Heme/Allergies: Negative.  Negative for environmental allergies. Does not bruise/bleed easily.  Psychiatric/Behavioral: Positive for depression. The patient is nervous/anxious.        Hx of ADD   Blood pressure 114/73, pulse 90, temperature 99.7 F (37.6 C), temperature source Oral, resp. rate 16, height '5\' 7"'  (1.702 m), weight 95.3 kg (210 lb), last menstrual period 11/15/2016, SpO2 91 %. Physical Exam  Constitutional: She is oriented to person, place, and time. She appears well-developed and well-nourished. No distress.  Having pain RUQ and Migraine at the same time.  Pain goes to her back  HENT:  Head: Normocephalic and atraumatic.  Mouth/Throat: No oropharyngeal exudate.  Eyes: Right eye exhibits no discharge. Left eye exhibits no discharge. No scleral icterus.  Neck: Normal range of motion. Neck supple. No JVD present. No tracheal deviation present. No thyromegaly present.  Cardiovascular: Normal rate, regular rhythm, normal heart sounds and intact distal pulses.   No murmur heard. Respiratory: Effort normal and breath sounds normal. No respiratory distress. She has no wheezes. She has no rales. She exhibits no tenderness.  Abdominal pain with sitting up  GI: Soft. She exhibits no distension and no mass. There is tenderness (significant pain RUQ). There is no rebound and no guarding.  Musculoskeletal: She exhibits no edema (trace).  Neurological: She is alert and oriented to person, place, and time.  Skin: Skin is warm and dry. No rash noted. She is not diaphoretic. No erythema. No pallor.  Psychiatric: She has a normal mood and affect. Her behavior is normal. Judgment and thought content normal.    Assessment/Plan: Cholelithiasis with cholecystitis ADD/Depression Migraines Hx of gastric ulcers URI -  Trouble voiding - 2 years duration on Ditropan   Plan:  She is complaining of ongoing pain RUQ, rising LFT's and cholecystitis.  Will recommend laparoscopic cholecystectomy  with IOC.  We have discussed risk and benefits. Pt is agreeable to surgery.    Ludmila Ebarb 12/03/2016, 7:26 AM

## 2016-12-03 NOTE — ED Notes (Signed)
Bed: WA28 Expected date:  Expected time:  Means of arrival:  Comments: Room 1 

## 2016-12-03 NOTE — ED Notes (Signed)
Patient also c/o head pain which she thinks is a migraine starting.  Surgical NP said to give her the prn dilaudid.

## 2016-12-03 NOTE — Transfer of Care (Signed)
Immediate Anesthesia Transfer of Care Note  Patient: Diane [name]  Procedure(s) Performed: Procedure(s): LAPAROSCOPIC CHOLECYSTECTOMY WITH INTRAOPERATIVE CHOLANGIOGRAM (N/A)  Patient Location: PACU  Anesthesia Type:General  Level of Consciousness:  sedated, patient cooperative and responds to stimulation  Airway & Oxygen Therapy:Patient Spontanous Breathing and Patient connected to face mask oxgen  Post-op Assessment:  Report given to PACU RN and Post -op Vital signs reviewed and stable  Post vital signs:  Reviewed and stable  Last Vitals:  Vitals:   12/03/16 0904 12/03/16 1130  BP: 114/66 (!) (P) 103/52  Pulse: 78 (P) 82  Resp: 16 (P) 15  Temp: 37.1 C (P) 37.1 C    Complications: No apparent anesthesia complications

## 2016-12-03 NOTE — Anesthesia Procedure Notes (Signed)
Procedure Name: Intubation Date/Time: 12/03/2016 10:12 AM Performed by: Maxwell Caul Pre-anesthesia Checklist: Patient identified, Emergency Drugs available, Suction available and Patient being monitored Patient Re-evaluated:Patient Re-evaluated prior to inductionOxygen Delivery Method: Circle system utilized Preoxygenation: Pre-oxygenation with 100% oxygen Intubation Type: IV induction, Rapid sequence and Cricoid Pressure applied Laryngoscope Size: Mac and 4 Grade View: Grade I Tube type: Oral Tube size: 7.5 mm Number of attempts: 1 Airway Equipment and Method: Stylet Placement Confirmation: ETT inserted through vocal cords under direct vision,  positive ETCO2 and breath sounds checked- equal and bilateral Secured at: 21 cm Tube secured with: Tape Dental Injury: Teeth and Oropharynx as per pre-operative assessment

## 2016-12-03 NOTE — Anesthesia Postprocedure Evaluation (Signed)
Anesthesia Post Note  Patient: Diane [name]  Procedure(s) Performed: Procedure(s) (LRB): LAPAROSCOPIC CHOLECYSTECTOMY WITH INTRAOPERATIVE CHOLANGIOGRAM (N/A)  Patient location during evaluation: PACU Anesthesia Type: General Level of consciousness: awake and alert and oriented Pain management: pain level controlled Vital Signs Assessment: post-procedure vital signs reviewed and stable Respiratory status: spontaneous breathing, nonlabored ventilation, respiratory function stable and patient connected to nasal cannula oxygen Cardiovascular status: blood pressure returned to baseline and stable Postop Assessment: no signs of nausea or vomiting Anesthetic complications: no       Last Vitals:  Vitals:   12/03/16 1215 12/03/16 1230  BP: 99/60 (!) 101/57  Pulse: 73 74  Resp: 14 15  Temp: 36.8 C 36.9 C    Last Pain:  Vitals:   12/03/16 1215  TempSrc:   PainSc: Asleep                 Nadira Single A.

## 2016-12-04 ENCOUNTER — Encounter (HOSPITAL_COMMUNITY): Payer: Self-pay | Admitting: Surgery

## 2016-12-04 DIAGNOSIS — R1011 Right upper quadrant pain: Secondary | ICD-10-CM | POA: Diagnosis not present

## 2016-12-04 DIAGNOSIS — K8000 Calculus of gallbladder with acute cholecystitis without obstruction: Secondary | ICD-10-CM | POA: Diagnosis not present

## 2016-12-04 DIAGNOSIS — R112 Nausea with vomiting, unspecified: Secondary | ICD-10-CM | POA: Diagnosis not present

## 2016-12-04 LAB — COMPREHENSIVE METABOLIC PANEL
ALBUMIN: 3.6 g/dL (ref 3.5–5.0)
ALK PHOS: 164 U/L — AB (ref 38–126)
ALT: 159 U/L — ABNORMAL HIGH (ref 14–54)
ANION GAP: 10 (ref 5–15)
AST: 121 U/L — ABNORMAL HIGH (ref 15–41)
BILIRUBIN TOTAL: 0.3 mg/dL (ref 0.3–1.2)
BUN: 9 mg/dL (ref 6–20)
CALCIUM: 8.9 mg/dL (ref 8.9–10.3)
CO2: 23 mmol/L (ref 22–32)
Chloride: 105 mmol/L (ref 101–111)
Creatinine, Ser: 0.6 mg/dL (ref 0.44–1.00)
GFR calc non Af Amer: >60 mL/min (ref >60)
GLUCOSE: 183 mg/dL — AB (ref 65–99)
POTASSIUM: 4.4 mmol/L (ref 3.5–5.1)
SODIUM: 138 mmol/L (ref 135–145)
TOTAL PROTEIN: 6.5 g/dL (ref 6.5–8.1)

## 2016-12-04 MED ORDER — FAMOTIDINE 20 MG PO TABS
20.0000 mg | ORAL_TABLET | Freq: Two times a day (BID) | ORAL | Status: DC
  Administered 2016-12-04: 20 mg via ORAL
  Filled 2016-12-04: qty 1

## 2016-12-04 MED ORDER — TRAMADOL HCL 50 MG PO TABS: 50.0000 mg | ORAL_TABLET | Freq: Two times a day (BID) | ORAL | 0 refills | Status: DC | PRN

## 2016-12-04 MED ORDER — OXYBUTYNIN CHLORIDE ER 5 MG PO TB24
10.0000 mg | ORAL_TABLET | Freq: Every day | ORAL | Status: DC
Start: 2016-12-04 — End: 2016-12-04

## 2016-12-04 MED ORDER — HYDROCODONE-ACETAMINOPHEN 5-325 MG PO TABS: 1.0000 | ORAL_TABLET | ORAL | 0 refills | Status: DC | PRN

## 2016-12-04 MED ORDER — MENTHOL 3 MG MT LOZG
LOZENGE | OROMUCOSAL | Status: AC
  Administered 2016-12-04: 02:00:00
  Filled 2016-12-04: qty 9

## 2016-12-04 MED ORDER — VENLAFAXINE HCL ER 150 MG PO CP24
300.0000 mg | ORAL_CAPSULE | Freq: Every day | ORAL | Status: DC
  Filled 2016-12-04: qty 2

## 2016-12-04 MED ORDER — ACETAMINOPHEN 325 MG PO TABS: 650.0000 mg | ORAL_TABLET | Freq: Four times a day (QID) | ORAL | Status: DC | PRN

## 2016-12-04 MED ORDER — TRAMADOL HCL 50 MG PO TABS: 50.0000 mg | ORAL_TABLET | Freq: Two times a day (BID) | ORAL | Status: DC | PRN

## 2016-12-04 MED ORDER — GABAPENTIN 300 MG PO CAPS
600.0000 mg | ORAL_CAPSULE | Freq: Three times a day (TID) | ORAL | Status: DC
  Filled 2016-12-04: qty 2

## 2016-12-04 NOTE — Progress Notes (Signed)
1 Day Post-Op  Subjective: She looks good, is sore, but eating breakfast well.    Objective: Vital signs in last 24 hours: Temp:  [98.1 F (36.7 C)-98.9 F (37.2 C)] 98.9 F (37.2 C) (01/18 0522) Pulse Rate:  [71-91] 71 (01/18 0522) Resp:  [14-20] 16 (01/18 0522) BP: (94-127)/(52-66) 115/61 (01/18 0522) SpO2:  [91 %-98 %] 96 % (01/18 0522) Last BM Date: 12/02/16 1080 Po 2600 IV Urine 3900 Afebrile, VSS Glucose is up and lipase and AST improved   Intake/Output from previous day: 01/17 0701 - 01/18 0700 In: 3705 [P.O.:1080; I.V.:2575; IV Piggyback:50] Out: 3900 [Urine:3900] Intake/Output this shift: No intake/output data recorded.  General appearance: alert, cooperative and no distress Resp: clear to auscultation bilaterally GI: soft, sore, sites [facility]      Lab Results:   Recent Labs  12/02/16 1632 12/03/16 0446  WBC 13.4* 9.0  HGB 13.0 11.4*  HCT 38.7 34.9*  PLT 292 227    BMET  Recent Labs  12/03/16 0500 12/04/16 0448  NA 135 138  K 3.9 4.4  CL 103 105  CO2 28 23  GLUCOSE 99 183*  BUN 8 9  CREATININE 0.62 0.60  CALCIUM 8.1* 8.9   PT/INR  Recent Labs  12/03/16 0500  LABPROT 13.0  INR 0.98     Recent Labs Lab 12/02/16 1632 12/03/16 0500 12/04/16 0448  AST 107* 314* 121*  ALT 51 217* 159*  ALKPHOS 126 171* 164*  BILITOT 0.2* 0.3 0.3  PROT 7.5 6.0* 6.5  ALBUMIN 4.3 3.5 3.6     Lipase     Component Value Date/Time   LIPASE 20 12/02/2016 1632     Studies/Results: Dg Cholangiogram Operative  Result Date: 12/03/2016 CLINICAL DATA:  Right upper quadrant pain EXAM: INTRAOPERATIVE CHOLANGIOGRAM TECHNIQUE: Cholangiographic images from the C-arm fluoroscopic device were submitted for interpretation post-operatively. Please see the procedural report for the amount of contrast and the fluoroscopy time utilized. COMPARISON:  None. FINDINGS: Contrast fills the biliary tree and duodenum without filling defects in the common bile duct.  IMPRESSION: Patent biliary tree. Electronically Signed   By: Jolaine Click M.D.   On: 12/03/2016 11:00   US Abdomen Complete  Result Date: 12/02/2016 CLINICAL DATA:  Right upper quadrant pain to the right flank EXAM: ABDOMEN ULTRASOUND COMPLETE COMPARISON:  CT 06/11/2014 FINDINGS: Gallbladder: Contracted. Echogenic arc with dense posterior shadowing compatible with large stone or multiple stones. Sonographer reports positive sonographic Murphy's. Normal wall thickness at 2.6 mm Common bile duct: Diameter: Normal at 3.5 mm Liver: Heterogeneous with areas of increased echogenicity suggesting fatty infiltration. No focal hepatic abnormality. IVC: No abnormality visualized. Pancreas: Visualized portion unremarkable. Spleen: Size and appearance within normal limits. Right Kidney: Length: 12.1. Echogenicity within normal limits. No mass or hydronephrosis visualized. Left Kidney: Length: 10 point for. Echogenicity within normal limits. No mass or hydronephrosis visualized. Abdominal aorta: Obscured by bowel gas Other findings: None. IMPRESSION: 1. Suspect large stone or multiple stones in the gallbladder. Sonographer reports a positive sonographic Murphy's sign, however wall thickness is normal ; cholecystitis cannot be excluded. No biliary dilatation. 2. Heterogenous echotexture of the liver suggests fatty infiltration. Electronically Signed   By: Jasmine Pang M.D.   On: 12/02/2016 21:50   . dextrose 5 % and 0.45 % NaCl with KCl 20 mEq/L 75 mL/hr at 12/04/16 0827   Medications:    Assessment/Plan Acute cholecystitis, cholelithiasis, abnormal LFT's S/p Laparoscopic cholecystectomy with intra-operative cholangiography, 12/03/16, Dr. Gerrit Friends ADD/Depression Hx of narcotic addiction -  rehab Migraines Hx of gastric ulcers URI -  Trouble voiding - 2 years duration on Ditropan  FEN:  Regular diet and IV fluids ID: Rocephin - pre op only DVT:  SCD  Plan:  Restart home meds, and home later today, she will  follow up and get LFT's in 2 weeks with PCP.     LOS: 0 days    Sherrie GeorgeJENNINGS,Osiel Stick 12/04/2016 [phone]

## 2016-12-04 NOTE — Progress Notes (Signed)
Provided patient with discharge instructions and prescriptions.

## 2016-12-04 NOTE — Care Management Note (Signed)
Case Management Note  Patient Details  Name: Diane [name] MRN: [id number] Date of Birth: 05/09/1981  Subjective/Objective: 36 y.o. F admitted for Lap Chole with IOC. Diet has been advanced and pt will be discharged later today with no HH needs.                    Action/Plan:CM will sign off for now but will be available should additional discharge needs arise or disposition change.    Expected Discharge Date:  12/04/16               Expected Discharge Plan:  Home/Self Care  In-House Referral:  NA  Discharge planning Services  CM Consult  Post Acute Care Choice:  NA Choice offered to:  NA (Record Review)  DME Arranged:  N/A DME Agency:  NA  HH Arranged:  NA HH Agency:  NA  Status of Service:  Completed, signed off  If discussed at Long Length of Stay Meetings, dates discussed:    Additional Comments:  Yvone NeuCrutchfield, Sheetal Lyall M, RN 12/04/2016, 11:20 AM

## 2016-12-04 NOTE — Discharge Summary (Signed)
Physician Discharge Summary  Diane [name]:[id number] DOB: 1981-06-09 DOA: 12/02/2016  PCP: Sharlene Dory, DO  Admit date: 12/02/2016 Discharge date: 12/04/2016  Admitted From: Home Disposition:  Home  Recommendations for Outpatient Follow-up:  1. Follow up with PCP in 2-3 weeks 2. Follow up with [facility] in 3 weeks 3. Recommend comprehensive metabolic panel in 2 weeks   Discharge Condition:Improved CODE STATUS:Full Diet recommendation:  Regular  Brief/Interim Summary: 36 y.o.femalewith medical history significant of migraine headaches, IBS, anxiety, anddepression; who presents with acute onset of abdominal pain since 3 PM yesterday afternoon. Patient describes a sharp pain in her right upper quadrant that radiated to her back. Pain was constant and rates it a 10 on the pain scale. Associated symptoms included subjective fever, chills, nausea, nonbloody emesis 5 episodes, and shortness of breath with pain symptoms, and headache. Denies having any chest pain, diarrhea, loss of consciousness, cough, or blood in urine/stools. Patient states that she's never had symptoms like this before in the past. She also notes that she recently had right foot surgery after fracture performed on 10/22/2016 by Dr. Loreta Ave. Patient states that she drinks alcohol more socially and not on a daily basis  Upon admission patient was seen to be afebrile with pulse up to 115, and all other vitals within normal limits. Lab work revealed WBC 13.4, AST 107, ALT 51, no other labs relatively within normal limits. Ultrasound of the abdomen showed suspected large stone or multiple stones within the gallbladder and positive Murphy sign. There was no biliary dilatation or increased wall thickness noted. General surgery was consulted Dr. Wenda Low. [facility] called to admit.  Suspected cholelithiasis and acute cholecystitis without obstructionacute.  - Patient presents with right upper quadrant  pain. - RUQ [place] reviewed. Gallstones noted on imaging - General Surgery consulted - Patient has undergone lap chole as of 1/17 - LFT's improved the following morning  Nausea and vomiting - suspect secondary to biliary disease -continued anti-emetics as needed  Elevated AST: Acute. Initial AST elevated 107 - suspect secondary to above biliary disease - Repeat cmp improved the following AM  Discharge Diagnoses:  Principal Problem:   Cholelithiasis and acute cholecystitis without obstruction Active Problems:   Nausea and vomiting   RUQ pain    Discharge Instructions   Allergies as of 12/04/2016      Reactions   Nsaids Other (See Comments)   DUE TO HISTORY OF GASTRIC ULCER   Nickel    "any jewelry that's not real"   Adhesive [tape] Rash   Metoclopramide Hcl Other (See Comments)   RESTLESSNESS/JITTERY   Prochlorperazine Edisylate Other (See Comments)   RESTLESSNESS/JITTERY      Medication List    TAKE these medications   acetaminophen 325 MG tablet Commonly known as:  TYLENOL Take 2 tablets (650 mg total) by mouth every 6 (six) hours as needed for mild pain (or Fever >/= 101).   benzonatate 100 MG capsule Commonly known as:  TESSALON Take 1 capsule (100 mg total) by mouth 3 (three) times daily as needed.   diazepam 5 MG tablet Commonly known as:  VALIUM Take 1 tablet (5 mg total) by mouth every 12 (twelve) hours as needed for anxiety.   dicyclomine 10 MG capsule Commonly known as:  BENTYL Take 1 capsule (10 mg total) by mouth 3 (three) times daily before meals.   FERREX 150 FORTE PLUS 50-100 MG Caps Take 150 mg by mouth 2 (two) times daily.   fluticasone 50  MCG/ACT nasal spray Commonly known as:  FLONASE Place 1 spray into both nostrils daily.   gabapentin 300 MG capsule Commonly known as:  NEURONTIN Take 2 capsules (600 mg total) by mouth 3 (three) times daily. What changed:  how much to take  when to take this  additional instructions    HYDROcodone-acetaminophen 5-325 MG tablet Commonly known as:  NORCO/VICODIN Take 1-2 tablets by mouth every 4 (four) hours as needed for moderate pain.   Magnesium 400 MG Tabs Take 1,200 mg by mouth daily.   NONFORMULARY OR COMPOUNDED ITEM [facility]:  Peripheral Neuropathy Cream - Bupivacaine 1%, Doxepin 3%, Gabapentin 6%, Pentoxifylline 3%, Topiramate 1%, + Diclofenac 3%, apply 1-2 grams to affected area 3-4 times daily.   oxybutynin 10 MG 24 hr tablet Commonly known as:  DITROPAN-XL Take 1 tablet (10 mg total) by mouth at bedtime.   promethazine 25 MG tablet Commonly known as:  PHENERGAN Take 1 tablet (25 mg total) by mouth every 8 (eight) hours as needed for nausea or vomiting.   ranitidine 150 MG capsule Commonly known as:  ZANTAC Take 300 mg by mouth 2 (two) times daily.   tiZANidine 4 MG tablet Commonly known as:  ZANAFLEX Take 4 mg by mouth every 6 (six) hours as needed for muscle spasms.   traMADol 50 MG tablet Commonly known as:  ULTRAM Take 1 tablet (50 mg total) by mouth every 12 (twelve) hours as needed for moderate pain or severe pain.   venlafaxine XR 150 MG 24 hr capsule Commonly known as:  EFFEXOR-XR Take 2 capsules (300 mg total) by mouth daily before breakfast.      Follow-up Information    Jilda Roche Wendling, DO Follow up.   Specialty:  Family Medicine Why:  Call and make an appointment for follow up in 2 weeks and let them check your liver funciton studies.  I will give you a note with labs needed.  Our office will call you and set up appointment in 3 weeks. Contact information: [address] [place] [address] [phone]        [facility] Follow up.   Specialty:  General Surgery Why:  Our office should call you with an appointment in 3 weeks. Be sure you get your liver function studies completed before you return to our clinic.  If you do not hear from [place], call and ask for D[facility] clinic appointment  12/23/16.  Contact information: [address] [place] [address] [phone]          Allergies  Allergen Reactions  . Nsaids Other (See Comments)    DUE TO HISTORY OF GASTRIC ULCER  . Nickel     "any jewelry that's not real"   . Adhesive [Tape] Rash  . Metoclopramide Hcl Other (See Comments)    RESTLESSNESS/JITTERY  . Prochlorperazine Edisylate Other (See Comments)    RESTLESSNESS/JITTERY     Consultations:  General Surgery  Procedures/Studies: Dg Ankle Complete Right  Result Date: 11/14/2016 SEE PROGRESS NOTE FOR X-RAY REPORT   Dg Cholangiogram Operative  Result Date: 12/03/2016 CLINICAL DATA:  Right upper quadrant pain EXAM: INTRAOPERATIVE CHOLANGIOGRAM TECHNIQUE: Cholangiographic images from the C-arm fluoroscopic device were submitted for interpretation post-operatively. Please see the procedural report for the amount of contrast and the fluoroscopy time utilized. COMPARISON:  None. FINDINGS: Contrast fills the biliary tree and duodenum without filling defects in the common bile duct. IMPRESSION: Patent biliary tree. Electronically Signed  By: Jolaine Click M.D.   On: 12/03/2016 11:00   US Abdomen Complete  Result Date: 12/02/2016 CLINICAL DATA:  Right upper quadrant pain to the right flank EXAM: ABDOMEN ULTRASOUND COMPLETE COMPARISON:  CT 06/11/2014 FINDINGS: Gallbladder: Contracted. Echogenic arc with dense posterior shadowing compatible with large stone or multiple stones. Sonographer reports positive sonographic Murphy's. Normal wall thickness at 2.6 mm Common bile duct: Diameter: Normal at 3.5 mm Liver: Heterogeneous with areas of increased echogenicity suggesting fatty infiltration. No focal hepatic abnormality. IVC: No abnormality visualized. Pancreas: Visualized portion unremarkable. Spleen: Size and appearance within normal limits. Right Kidney: Length: 12.1. Echogenicity within normal limits. No mass or hydronephrosis visualized. Left Kidney:  Length: 10 point for. Echogenicity within normal limits. No mass or hydronephrosis visualized. Abdominal aorta: Obscured by bowel gas Other findings: None. IMPRESSION: 1. Suspect large stone or multiple stones in the gallbladder. Sonographer reports a positive sonographic Murphy's sign, however wall thickness is normal ; cholecystitis cannot be excluded. No biliary dilatation. 2. Heterogenous echotexture of the liver suggests fatty infiltration. Electronically Signed   By: Jasmine Pang M.D.   On: 12/02/2016 21:50   US Venous Img Lower Unilateral Right  Result Date: 11/13/2016 CLINICAL DATA:  Right calf pain. History of recent right ankle surgery. EXAM: RIGHT LOWER EXTREMITY VENOUS DOPPLER ULTRASOUND TECHNIQUE: Gray-scale sonography with graded compression, as well as color Doppler and duplex ultrasound were performed to evaluate the lower extremity deep venous systems from the level of the common femoral vein and including the common femoral, femoral, profunda femoral, popliteal and calf veins including the posterior tibial, peroneal and gastrocnemius veins when visible. The superficial great saphenous vein was also interrogated. Spectral Doppler was utilized to evaluate flow at rest and with distal augmentation maneuvers in the common femoral, femoral and popliteal veins. COMPARISON:  None. FINDINGS: Contralateral Common Femoral Vein: Respiratory phasicity is normal and symmetric with the symptomatic side. No evidence of thrombus. Normal compressibility. Common Femoral Vein: No evidence of thrombus. Normal compressibility, respiratory phasicity and response to augmentation. Saphenofemoral Junction: No evidence of thrombus. Normal compressibility and flow on color Doppler imaging. Profunda Femoral Vein: No evidence of thrombus. Normal compressibility and flow on color Doppler imaging. Femoral Vein: No evidence of thrombus. Normal compressibility, respiratory phasicity and response to augmentation. Popliteal  Vein: No evidence of thrombus. Normal compressibility, respiratory phasicity and response to augmentation. Calf Veins: No evidence of thrombus. Normal compressibility and flow on color Doppler imaging. Superficial Great Saphenous Vein: No evidence of thrombus. Normal compressibility and flow on color Doppler imaging. Venous Reflux:  None. Other Findings: No evidence of superficial thrombophlebitis or abnormal fluid collection. IMPRESSION: No evidence of right lower extremity deep venous thrombosis. Electronically Signed   By: Irish Lack M.D.   On: 11/13/2016 17:34   Lap chole 1/17  Subjective: No complaints  Discharge Exam: Vitals:   12/04/16 0218 12/04/16 0522  BP: 112/61 115/61  Pulse: 74 71  Resp: 16 16  Temp: 98.1 F (36.7 C) 98.9 F (37.2 C)   Vitals:   12/03/16 1825 12/03/16 2123 12/04/16 0218 12/04/16 0522  BP:  (!) 127/54 112/61 115/61  Pulse:  91 74 71  Resp:  16 16 16   Temp:  98.8 F (37.1 C) 98.1 F (36.7 C) 98.9 F (37.2 C)  TempSrc:  Oral Oral Oral  SpO2: 91% 96% 96% 96%  Weight:      Height:        General: Pt is alert, awake, not in acute distress Cardiovascular:  RRR, S1/S2 +, no rubs, no gallops Respiratory: CTA bilaterally, no wheezing, no rhonchi Abdominal: Soft, NT, ND, bowel sounds + Extremities: no edema, no cyanosis   The results of significant diagnostics from this hospitalization (including imaging, microbiology, ancillary and laboratory) are listed below for reference.     Microbiology: No results found for this or any previous visit (from the past 240 hour(s)).   Labs: BNP (last 3 results) No results for input(s): BNP in the last 8760 hours. Basic Metabolic Panel:  Recent Labs Lab 12/02/16 1632 12/03/16 0500 12/04/16 0448  NA 137 135 138  K 4.1 3.9 4.4  CL 102 103 105  CO2 25 28 23   GLUCOSE 110* 99 183*  BUN 10 8 9   CREATININE 0.61 0.62 0.60  CALCIUM 9.1 8.1* 8.9   Liver Function Tests:  Recent Labs Lab 12/02/16 1632  12/03/16 0500 12/04/16 0448  AST 107* 314* 121*  ALT 51 217* 159*  ALKPHOS 126 171* 164*  BILITOT 0.2* 0.3 0.3  PROT 7.5 6.0* 6.5  ALBUMIN 4.3 3.5 3.6    Recent Labs Lab 12/02/16 1632  LIPASE 20   No results for input(s): AMMONIA in the last 168 hours. CBC:  Recent Labs Lab 12/02/16 1632 12/03/16 0446  WBC 13.4* 9.0  HGB 13.0 11.4*  HCT 38.7 34.9*  MCV 87.6 87.5  PLT 292 227   Cardiac Enzymes: No results for input(s): CKTOTAL, CKMB, CKMBINDEX, TROPONINI in the last 168 hours. BNP: Invalid input(s): POCBNP CBG: No results for input(s): GLUCAP in the last 168 hours. D-Dimer No results for input(s): DDIMER in the last 72 hours. Hgb A1c No results for input(s): HGBA1C in the last 72 hours. Lipid Profile No results for input(s): CHOL, HDL, LDLCALC, TRIG, CHOLHDL, LDLDIRECT in the last 72 hours. Thyroid function studies No results for input(s): TSH, T4TOTAL, T3FREE, THYROIDAB in the last 72 hours.  Invalid input(s): FREET3 Anemia work up No results for input(s): VITAMINB12, FOLATE, FERRITIN, TIBC, IRON, RETICCTPCT in the last 72 hours. Urinalysis    Component Value Date/Time   COLORURINE YELLOW 12/02/2016 1620   APPEARANCEUR CLEAR 12/02/2016 1620   LABSPEC 1.012 12/02/2016 1620   PHURINE 7.0 12/02/2016 1620   GLUCOSEU NEGATIVE 12/02/2016 1620   HGBUR NEGATIVE 12/02/2016 1620   HGBUR negative 06/16/2008 1552   BILIRUBINUR NEGATIVE 12/02/2016 1620   KETONESUR NEGATIVE 12/02/2016 1620   PROTEINUR NEGATIVE 12/02/2016 1620   UROBILINOGEN 0.2 06/11/2014 1943   NITRITE NEGATIVE 12/02/2016 1620   LEUKOCYTESUR NEGATIVE 12/02/2016 1620   Sepsis Labs Invalid input(s): PROCALCITONIN,  WBC,  LACTICIDVEN Microbiology No results found for this or any previous visit (from the past 240 hour(s)).   SIGNED:   Jerald KiefHIU, Kenny Rea K, MD  [facility] 12/04/2016, 11:24 AM  If 7PM-7AM, please contact night-coverage [email or web address] Password [email or web address]

## 2016-12-04 NOTE — Discharge Instructions (Signed)
[facility] ______[facility] °LAPAROSCOPIC SURGERY: POST OP INSTRUCTIONS °Always review your discharge instruction sheet given to you by the facility where your surgery was performed. °IF YOU HAVE DISABILITY OR FAMILY LEAVE FORMS, YOU MUST BRING THEM TO THE OFFICE FOR PROCESSING.   °DO NOT GIVE THEM TO YOUR DOCTOR. ° °1. A prescription for pain medication may be given to you upon discharge.  Take your pain medication as prescribed, if needed.  If narcotic pain medicine is not needed, then you may take acetaminophen (Tylenol) or ibuprofen (Advil) as needed. °2. Take your usually prescribed medications unless otherwise directed. °3. If you need a refill on your pain medication, please contact your pharmacy.  They will contact our office to request authorization. Prescriptions will not be filled after 5pm or on week-ends. °4. You should follow a light diet the first few days after arrival home, such as soup and crackers, etc.  Be sure to include lots of fluids daily. °5. Most patients will experience some swelling and bruising in the area of the incisions.  Ice packs will help.  Swelling and bruising can take several days to resolve.  °6. It is common to experience some constipation if taking pain medication after surgery.  Increasing fluid intake and taking a stool softener (such as Colace) will usually help or prevent this problem from occurring.  A mild laxative (Milk of Magnesia or Miralax) should be taken according to package instructions if there are no bowel movements after 48 hours. °7. Unless discharge instructions indicate otherwise, you may remove your bandages 24-48 hours after surgery, and you may shower at that time.  You may have steri-strips (small skin tapes) in place directly over the incision.  These strips should be left on the skin for 7-10 days.  If your surgeon used skin glue on the incision, you may shower in 24 hours.  The glue will flake off over the next 2-3 weeks.  Any sutures or  staples will be removed at the office during your follow-up visit. °8. ACTIVITIES:  You may resume regular (light) daily activities beginning the next day--such as daily self-care, walking, climbing stairs--gradually increasing activities as tolerated.  You may have sexual intercourse when it is comfortable.  Refrain from any heavy lifting or straining until approved by your doctor. °a. You may drive when you are no longer taking prescription pain medication, you can comfortably wear a seatbelt, and you can safely maneuver your car and apply brakes. °b. RETURN TO WORK:  __________________________________________________________ °9. You should see your doctor in the office for a follow-up appointment approximately 2-3 weeks after your surgery.  Make sure that you call for this appointment within a day or two after you arrive home to insure a convenient appointment time. °10. OTHER INSTRUCTIONS: __________________________________________________________________________________________________________________________ __________________________________________________________________________________________________________________________ °WHEN TO CALL YOUR DOCTOR: °1. Fever over 101.0 °2. Inability to urinate °3. Continued bleeding from incision. °4. Increased pain, redness, or drainage from the incision. °5. Increasing abdominal pain ° °The clinic staff is available to answer your questions during regular business hours.  Please don’t hesitate to call and ask to speak to one of the nurses for clinical concerns.  If you have a medical emergency, go to the nearest emergency room or call 911.  A surgeon from [facility] is always on call at the hospital. °[address], [address], [place], [place]  [address] ? [address] [address], [place], [place]   [address] °[phone] ? [phone] ? FAX [phone] °Web site:   [email or web address] ° ° °Laparoscopic Cholecystectomy, Care After °This sheet  gives you information about how to care for yourself after your procedure. Your health care provider may also give you more specific instructions. If you have problems or questions, contact your health care provider. °What can I expect after the procedure? °After the procedure, it is common to have: °· Pain at your incision sites. You will be given medicines to control this pain. °· Mild nausea or vomiting. °· Bloating and possible shoulder pain from the air-like gas that was used during the procedure. ° °Follow these instructions at home: °Incision care ° °· Follow instructions from your health care provider about how to take care of your incisions. Make sure you: °? Wash your hands with soap and water before you change your bandage (dressing). If soap and water are not available, use hand sanitizer. °? Change your dressing as told by your health care provider. °? Leave stitches (sutures), skin glue, or adhesive strips in place. These skin closures may need to be in place for 2 weeks or longer. If adhesive strip edges start to loosen and curl up, you may trim the loose edges. Do not remove adhesive strips completely unless your health care provider tells you to do that. °· Do not take baths, swim, or use a hot tub until your health care provider approves. Ask your health care provider if you can take showers. You may only be allowed to take sponge baths for bathing. °· Check your incision area every day for signs of infection. Check for: °? More redness, swelling, or pain. °? More fluid or blood. °? Warmth. °? Pus or a bad smell. °Activity °· Do not drive or use heavy machinery while taking prescription pain medicine. °· Do not lift anything that is heavier than 10 lb (4.5 kg) until your health care provider approves. °· Do not play contact sports until your health care provider approves. °· Do not drive for 24 hours if you were given a medicine to help you relax (sedative). °· Rest as needed. Do not return to work  or school until your health care provider approves. °General instructions °· Take over-the-counter and prescription medicines only as told by your health care provider. °· To prevent or treat constipation while you are taking prescription pain medicine, your health care provider may recommend that you: °? Drink enough fluid to keep your urine clear or pale yellow. °? Take over-the-counter or prescription medicines. °? Eat foods that are high in fiber, such as fresh fruits and vegetables, whole grains, and beans. °? Limit foods that are high in fat and processed sugars, such as fried and sweet foods. °Contact a health care provider if: °· You develop a rash. °· You have more redness, swelling, or pain around your incisions. °· You have more fluid or blood coming from your incisions. °· Your incisions feel warm to the touch. °· You have pus or a bad smell coming from your incisions. °· You have a fever. °· One or more of your incisions breaks open. °Get help right away if: °· You have trouble breathing. °· You have chest pain. °· You have increasing pain in your shoulders. °· You faint or feel dizzy when you stand. °· You have severe pain in your abdomen. °· You have nausea or vomiting that lasts for more than one day. °· You have leg pain. °This information is not intended to replace advice given to you by your health care provider. Make sure you   discuss any questions you have with your health care provider. °Document Released: 11/03/2005 Document Revised: 05/24/2016 Document Reviewed: 04/21/2016 °Elsevier Interactive Patient Education © 2017 [employer] ° °

## 2016-12-05 ENCOUNTER — Telehealth: Payer: Self-pay | Admitting: Behavioral Health

## 2016-12-05 NOTE — Telephone Encounter (Signed)
I have spoken to patient and she states she had surgery 12/03/16 for gallbladder removal and her surgeon wants her to come in to the office to get follow up lab work done to checkher liver enzymes. Pt also stated she had a head cold that has not been getting better. So I have scheduled patient for appointment on Monday for evaluation. Pt also reported losing her voice with sore throat and fever. FYI only. TL/CMA

## 2016-12-05 NOTE — Telephone Encounter (Signed)
Patient declined hospital follow-up visit at this time. She is scheduled to see PCP on Monday, 12/08/16 for upper respiratory symptoms. Patient voiced that she would like to first speak with Dr. [name] at her next appointment and determine from that point if she still needs a hospital follow-up or not. Advised patient to call the office if there are any changes. She verbalized understanding and did not address no further concerns prior to call ending.

## 2016-12-08 ENCOUNTER — Encounter: Payer: Self-pay | Admitting: Family Medicine

## 2016-12-08 ENCOUNTER — Ambulatory Visit (INDEPENDENT_AMBULATORY_CARE_PROVIDER_SITE_OTHER): Payer: BLUE CROSS/BLUE SHIELD | Admitting: Family Medicine

## 2016-12-08 VITALS — BP 128/80 | HR 104 | Temp 98.5°F | Resp 16 | Ht 67.0 in | Wt 217.4 lb

## 2016-12-08 DIAGNOSIS — G43809 Other migraine, not intractable, without status migrainosus: Secondary | ICD-10-CM | POA: Diagnosis not present

## 2016-12-08 DIAGNOSIS — B9689 Other specified bacterial agents as the cause of diseases classified elsewhere: Secondary | ICD-10-CM

## 2016-12-08 DIAGNOSIS — J208 Acute bronchitis due to other specified organisms: Secondary | ICD-10-CM

## 2016-12-08 DIAGNOSIS — Z09 Encounter for follow-up examination after completed treatment for conditions other than malignant neoplasm: Secondary | ICD-10-CM

## 2016-12-08 MED ORDER — AZITHROMYCIN 250 MG PO TABS: ORAL_TABLET | ORAL | 0 refills | Status: DC

## 2016-12-08 MED ORDER — NARATRIPTAN HCL 1 MG PO TABS: ORAL_TABLET | ORAL | 0 refills | Status: DC

## 2016-12-08 NOTE — Progress Notes (Signed)
Pre visit review using our clinic review tool, if applicable. No additional management support is needed unless otherwise documented below in the visit note. 

## 2016-12-08 NOTE — Progress Notes (Signed)
Chief Complaint  Patient presents with  . Nasal Congestion    Pt reports nasal congestion and hoarseness x 1 week. Had cholescystectomy on Wednesday and has had fever since then (100.5).  Benzonatate not helping her cough.    Diane [name] here for URI complaints.  Duration: 1 week  Associated symptoms: sinus congestion, sore throat and cough Denies: subjective fever, sinus pain, itchy watery eyes, ear pain, ear drainage and myalgia Treatment to date: Tessalon Perles- no relief Sick contacts: No   HA's She has a history of migraines. She gets them with every menstrual. In addition to 2 other times throughout the month. She is not on any abortive medicine right now. She takes gabapentin for both neuropathic pain from her podiatrist in addition to prophylaxis. I have a headache right now. When she gets a headache, she does have an aura. She has associated nausea, vomiting, phonophobia, and photophobia. She used to be on Amerge and that worked well.  Hospital f/u The patient was recently admitted to [facility] for acute cholecystitis with cholelithiasis. She underwent's lap is A cholecystectomy without issue. She states that her port sites are very itchy. No erythema, pain, or drainage. She is due for follow-up laboratory work in early February. Her follow-up with the general surgeon is in early to mid February as well.  ROS:  Const: +fevers HEENT: As noted in HPI Lungs: No SOB  Past Medical History:  Diagnosis Date  . ADD (attention deficit disorder with hyperactivity)    no current med.  . Anemia    takes iron supplement  . Anxiety   . Cough 10/16/2016  . Depression   . Family history of adverse reaction to anesthesia    pt's father has hx. of being hard to wake up post-op  . GERD (gastroesophageal reflux disease)   . History of gastric ulcer   . History of kidney stones   . IBS (irritable bowel syndrome)   . Migraines   . Osteoarthritis    right ankle  .  Osteochondral lesion 09/2016   right ankle  . Plantar fasciitis of left foot   . PONV (postoperative nausea and vomiting)   . Pseudotumor cerebri   . Wears partial dentures    upper   Family History  Problem Relation Age of Onset  . Brain cancer Father   . Anesthesia problems Father     hard to wake up post-op  . Lung cancer Father   . Breast cancer Maternal Grandmother   . Ovarian cancer Maternal Grandmother   . Diabetes Maternal Grandmother   . Heart disease Maternal Grandmother   . Irritable bowel syndrome Mother   . Prostate cancer Maternal Grandfather   . Melanoma Maternal Grandfather     BP 128/80 (BP Location: Right Arm, Cuff Size: Large)   Pulse (!) 104   Temp 98.5 F (36.9 C) (Oral)   Resp 16   Ht 5\' 7"  (1.702 m)   Wt 217 lb 6.4 oz (98.6 kg)   LMP 11/15/2016   SpO2 99% Comment: room air  BMI 34.05 kg/m  General: Awake, alert, appears stated age HEENT: AT, [place], ears patent b/l and TM's neg, nares patent w/o discharge, pharynx pink and without exudates, MMM Neck: No masses or asymmetry Heart: RRR, no murmurs, no bruits Lungs: CTAB, no accessory muscle use Abd: Port sites c/d/i, no evidence of infection, healing nicely Psych: Age appropriate judgment and insight, normal mood and affect  Acute bacterial bronchitis - Plan: azithromycin (  ZITHROMAX) 250 MG tablet  Other migraine without status migrainosus, not intractable - Plan: naratriptan (AMERGE) 1 MG TABS tablet  Hospital discharge follow-up - Plan: Hepatic function panel  Orders as above. Continue to push fluids, practice good hand hygiene, cover mouth when coughing. Discussed staying on the gabapentin if it is being used for neuropathic pain. We'll add back a triptan that was effective for her. Also recommended being able to use naproxen and repeating in one hour as an alternative for abortive treatment. Appears to be doing well from hospital discharge. F/u in 1 week if symptoms worsen or fail to  improve. Pt voiced understanding and agreement to the plan.  Jilda Roche [place], DO 12/08/16 4:50 PM

## 2016-12-08 NOTE — Patient Instructions (Addendum)
Another alternative to Amerge is taking 2 Aleve, repeating in 1 hr if no improvement.  Continue to push fluids, practice good hand hygiene, and cover your mouth if you cough.

## 2016-12-09 ENCOUNTER — Ambulatory Visit (HOSPITAL_BASED_OUTPATIENT_CLINIC_OR_DEPARTMENT_OTHER)
Admission: RE | Admit: 2016-12-09 | Discharge: 2016-12-09 | Disposition: A | Discharge status: Home / Self Care | Payer: BLUE CROSS/BLUE SHIELD | Source: Ambulatory Visit | Attending: Podiatry | Admitting: Podiatry

## 2016-12-09 ENCOUNTER — Encounter: Payer: Self-pay | Admitting: Podiatry

## 2016-12-09 ENCOUNTER — Other Ambulatory Visit: Payer: Self-pay | Admitting: Podiatry

## 2016-12-09 ENCOUNTER — Ambulatory Visit (INDEPENDENT_AMBULATORY_CARE_PROVIDER_SITE_OTHER): Payer: BLUE CROSS/BLUE SHIELD | Admitting: Podiatry

## 2016-12-09 DIAGNOSIS — Z8781 Personal history of (healed) traumatic fracture: Secondary | ICD-10-CM

## 2016-12-09 DIAGNOSIS — Z9889 Other specified postprocedural states: Secondary | ICD-10-CM

## 2016-12-09 DIAGNOSIS — M949 Disorder of cartilage, unspecified: Secondary | ICD-10-CM

## 2016-12-09 DIAGNOSIS — M722 Plantar fascial fibromatosis: Secondary | ICD-10-CM | POA: Diagnosis not present

## 2016-12-09 DIAGNOSIS — Z967 Presence of other bone and tendon implants: Secondary | ICD-10-CM | POA: Diagnosis present

## 2016-12-09 DIAGNOSIS — M899 Disorder of bone, unspecified: Secondary | ICD-10-CM
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
## 2016-12-11 ENCOUNTER — Telehealth: Payer: Self-pay | Admitting: Family Medicine

## 2016-12-11 MED ORDER — PROMETHAZINE-DM 6.25-15 MG/5ML PO SYRP: 2.5000 mL | ORAL_SOLUTION | Freq: Four times a day (QID) | ORAL | 0 refills | Status: DC | PRN

## 2016-12-11 NOTE — Telephone Encounter (Signed)
Relation to ZO:XWRUpt:self Call back number:[phone] Pharmacy:  [id number] - [place], [place] - [address] [phone] (Phone) [phone] (Fax)     Reason for call:  Patient last seen 12/08/16 states symptoms have not improved requesting Rx for cough, patient states she had surgery 12/03/16 declined appointment stating she doesn't feel good, please advise

## 2016-12-12 ENCOUNTER — Telehealth: Payer: Self-pay | Admitting: Family Medicine

## 2016-12-12 MED ORDER — NAPROXEN 500 MG PO TABS: 500.0000 mg | ORAL_TABLET | Freq: Two times a day (BID) | ORAL | 0 refills | Status: DC

## 2016-12-12 NOTE — Telephone Encounter (Signed)
I have informed pt Rx for medication was sent to the pharmacy for pick up. PT states sheis allergice to NSAIDs . Per Dr. [name] recommendation patient was informed to continue Amerge and OTC tylenol for relief of headaches.Pt was upset Dr. [name] would not refill Rx for Tizanidine and stated she was going to find another Doctor. FYI only. TL/CMA

## 2016-12-12 NOTE — Telephone Encounter (Signed)
Patient called requesting a refill of tiZANidine (ZANAFLEX) 4 MG tablet She stated that she woke up with a migraine this morning and she is out of the medication. She said that she takes 1.5 when she has bad migraines which is why she is out of the medication sooner. She states her neurologist is closed today and they will not be able to help her until Monday. She is requesting that Dr. [name] call this in or a different type of muscle relaxer if he knows of one that works better. She would like a call back once called in. Please advise  Pharmacy: [facility] - [place], [place] - [address]  **pharmacy closes at 6pm if after 6 pm she would like [facility]**

## 2016-12-12 NOTE — Telephone Encounter (Signed)
Per Dr. [name] recommendations Naproxen was sent into pharmacy for pick up. TL/CMA

## 2016-12-15 ENCOUNTER — Encounter: Payer: Self-pay | Admitting: Physical Medicine & Rehabilitation

## 2016-12-15 ENCOUNTER — Ambulatory Visit (HOSPITAL_BASED_OUTPATIENT_CLINIC_OR_DEPARTMENT_OTHER): Payer: BLUE CROSS/BLUE SHIELD | Admitting: Physical Medicine & Rehabilitation

## 2016-12-15 ENCOUNTER — Encounter: Payer: BLUE CROSS/BLUE SHIELD | Attending: Physical Medicine & Rehabilitation

## 2016-12-15 VITALS — BP 126/90 | HR 105 | Resp 14

## 2016-12-15 DIAGNOSIS — M25571 Pain in right ankle and joints of right foot: Secondary | ICD-10-CM | POA: Insufficient documentation

## 2016-12-15 DIAGNOSIS — D649 Anemia, unspecified: Secondary | ICD-10-CM | POA: Insufficient documentation

## 2016-12-15 DIAGNOSIS — G5781 Other specified mononeuropathies of right lower limb: Secondary | ICD-10-CM | POA: Diagnosis not present

## 2016-12-15 DIAGNOSIS — K589 Irritable bowel syndrome without diarrhea: Secondary | ICD-10-CM | POA: Diagnosis not present

## 2016-12-15 DIAGNOSIS — F419 Anxiety disorder, unspecified: Secondary | ICD-10-CM | POA: Insufficient documentation

## 2016-12-15 DIAGNOSIS — F329 Major depressive disorder, single episode, unspecified: Secondary | ICD-10-CM | POA: Diagnosis not present

## 2016-12-15 DIAGNOSIS — G8929 Other chronic pain: Secondary | ICD-10-CM | POA: Diagnosis not present

## 2016-12-15 DIAGNOSIS — F988 Other specified behavioral and emotional disorders with onset usually occurring in childhood and adolescence: Secondary | ICD-10-CM | POA: Diagnosis not present

## 2016-12-15 DIAGNOSIS — M545 Low back pain: Secondary | ICD-10-CM | POA: Insufficient documentation

## 2016-12-15 DIAGNOSIS — K219 Gastro-esophageal reflux disease without esophagitis: Secondary | ICD-10-CM | POA: Insufficient documentation

## 2016-12-15 DIAGNOSIS — Z8711 Personal history of peptic ulcer disease: Secondary | ICD-10-CM | POA: Insufficient documentation

## 2016-12-15 NOTE — Progress Notes (Signed)
Subjective:    Patient ID: Diane [name], female    DOB: 06-09-1981, 36 y.o.   MRN: [id number] Consult requested by Dr. Jacqualyn Posey HPI  Chief complaint right ankle pain 36 year old female with past medical history significant for chronic migraine Right ankle pain since 01/05/16 when she fell and sustained R trimalleolarFractures after missing a step. Admitted to [facility] for ORIF. She was discharged on 01/07/2016. She is placed on Xarelto for DVT prophylaxis. She received oxycodone 10 mg postsurgically She underwent removal of hardware on 07/01/2016 stating that she felt like she has an allergy to the metal. There was no improvement in her pain symptoms after hardware removal She is maintained in a cam walker boot.  She was referred to podiatry evaluated by Dr. Jacqualyn Posey 09/23/2016, ankle was aspirated. No evidence of infection Underwent ankle arthroscopy 10/22/2016 Increased burning pain top of foot since last surgery Podiatry follow-up 11/11/2016, mild edema of the foot, no other vasomotor signs. Refill tramadol and gabapentin, physical therapy was ordered Vascular ultrasound to rule out DVT was performed 11/05/2016, which was negative  Other past surgical history underwent laparoscopic cholecystectomy 12/03/2016   Takes neurontin 637m qam and 90103mqpm She wishes to avoid narcotic analgesics because she's had issues with narcotic abuse in the past, elevated opioid risk score Pain Inventory Average Pain 8 Pain Right Now 7 My pain is sharp, stabbing and aching  In the last 24 hours, has pain interfered with the following? General activity 4 Relation with others 5 Enjoyment of life 5 What TIME of day is your pain at its worst? morning, evening, night Sleep (in general) Fair  Pain is worse with: walking, bending, inactivity, standing and some activites Pain improves with: rest, medication and injections Relief from Meds: 5  Mobility walk without assistance how many  minutes can you walk? 30 ability to climb steps?  yes do you drive?  no Do you have any goals in this area?  yes  Function employed # of hrs/week . 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 Brain cancer Father   . Anesthesia problems Father     hard to wake up post-op  . Lung cancer Father   . Breast cancer Maternal Grandmother   . Ovarian cancer Maternal Grandmother   . Diabetes Maternal Grandmother   . Heart disease Maternal Grandmother   . Irritable bowel syndrome Mother   . Prostate cancer Maternal Grandfather   . Melanoma Maternal Grandfather    Social History   Social History  . Marital status: Married    Spouse name: N/A  . Number of children: 2  . Years of education: N/A   Occupational History  . 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 Smoking status: Former Smoker    Years: 15.00    Types: Cigarettes  . Smokeless tobacco: Never Used     Comment: 2 cig./day  . Alcohol use 0.0 oz/week     Comment: occasionally  . Drug use: No  . Sexual activity: Yes   Other Topics Concern  . None   Social History Narrative  . None   Past Surgical History:  Procedure Laterality Date  . ANKLE ARTHROSCOPY  Right 10/22/2016   Procedure: ANKLE ARTHROSCOPY;  Surgeon: Trula Slade, DPM;  Location: [facility];  Service: Podiatry;  Laterality: Right;  GENERAL/REG BLOCK  . BLADDER SURGERY     x 2 - as a child to stretch bladder  . CESAREAN SECTION  12/24/2009  . CESAREAN SECTION  11/20/2011   Procedure: CESAREAN SECTION;  Surgeon: Lovenia Kim, MD;  Location: [facility];  Service: Gynecology;  Laterality: N/A;  . CHOLECYSTECTOMY N/A 12/03/2016    Procedure: LAPAROSCOPIC CHOLECYSTECTOMY WITH INTRAOPERATIVE CHOLANGIOGRAM;  Surgeon: Armandina Gemma, MD;  Location: [facility];  Service: General;  Laterality: N/A;  . CHROMOPERTUBATION  03/05/2007  . CYSTOSCOPY WITH RETROGRADE PYELOGRAM, URETEROSCOPY AND STENT PLACEMENT Left 08/10/2006  . HARDWARE REMOVAL Right 07/01/2016   Procedure: HARDWARE REMOVAL;  Surgeon: Meredith Pel, MD;  Location: [facility];  Service: Orthopedics;  Laterality: Right;  . HYSTEROSCOPY W/ ENDOMETRIAL ABLATION  03/05/2007  . KNEE ARTHROSCOPY WITH LATERAL RELEASE Right 12/13/2014   Procedure: KNEE ARTHROSCOPY WITH LATERAL RELEASE;  Surgeon: Yvette Rack., MD;  Location: [facility];  Service: Orthopedics;  Laterality: Right;  . LAPAROSCOPIC APPENDECTOMY N/A 06/11/2014   Procedure: APPENDECTOMY LAPAROSCOPIC;  Surgeon: Earnstine Regal, MD;  Location: [facility];  Service: General;  Laterality: N/A;  . ORIF ANKLE FRACTURE Right 01/05/2016   Procedure: OPEN REDUCTION INTERNAL FIXATION (ORIF) ANKLE FRACTURE;  Surgeon: Meredith Pel, MD;  Location: [facility];  Service: Orthopedics;  Laterality: Right;  . OVARIAN CYST SURGERY Right 03/05/2007  . shunts   2009   tried to do shunts for pseudotumor   . SYNOVECTOMY Right 7/34/1937   Procedure: PLICA SYNOVECTOMY;  Surgeon: Yvette Rack., MD;  Location: [facility];  Service: Orthopedics;  Laterality: Right;  . TONSILLECTOMY AND ADENOIDECTOMY    . WISDOM TOOTH EXTRACTION     Past Medical History:  Diagnosis Date  . ADD (attention deficit disorder with hyperactivity)    no current med.  . Anemia    takes iron supplement  . Anxiety   . Cough 10/16/2016  . Depression   . Family history of adverse reaction to anesthesia    pt's father has hx. of being hard to wake up post-op  . GERD (gastroesophageal reflux disease)   . History of gastric ulcer   . History of kidney stones   . IBS (irritable bowel syndrome)   . Migraines   . Osteoarthritis    right ankle  .  Osteochondral lesion 09/2016   right ankle  . Plantar fasciitis of left foot   . PONV (postoperative nausea and vomiting)   . Pseudotumor cerebri   . Wears partial dentures    upper   LMP 11/15/2016   Opioid Risk Score:   Fall Risk Score:  `1  Depression screen PHQ 2/9  Depression screen PHQ 2/9 12/15/2016  Decreased Interest 1  Down, Depressed, Hopeless 1  PHQ - 2 Score 2  Altered sleeping 3  Tired, decreased energy 2  Change in appetite 2  Feeling bad or failure about yourself  0  Trouble concentrating 0  Moving slowly or fidgety/restless 0  Suicidal thoughts 0  PHQ-9 Score 9  Some recent data might be hidden    Review of Systems  Constitutional: Positive for appetite change, diaphoresis and unexpected weight change.  HENT: Negative.   Eyes: Negative.   Respiratory:       Infection  Cardiovascular: Positive for leg swelling.  Gastrointestinal: Negative.  Endocrine: Negative.   Genitourinary: Negative.        Retention  Musculoskeletal: Positive for arthralgias.  Skin: Negative.   Neurological: Positive for weakness and numbness.       Tingling   Hematological: Negative.   Psychiatric/Behavioral: Positive for dysphoric mood. The patient is nervous/anxious.   All other systems reviewed and are negative.      Objective:   Physical Exam  Constitutional: She is oriented to person, place, and time. She appears well-developed and well-nourished.  Cardiovascular: Normal rate, regular rhythm and normal heart sounds.   No murmur heard. Pulmonary/Chest: Effort normal and breath sounds normal. No respiratory distress. She has no wheezes.  Abdominal: Soft. Bowel sounds are normal. She exhibits no distension. There is no tenderness.  Musculoskeletal:       Right ankle: She exhibits swelling. She exhibits normal range of motion, no ecchymosis, no deformity and normal pulse. Tenderness. Lateral malleolus tenderness found. No medial malleolus tenderness found.        Lumbar back: She exhibits decreased range of motion and tenderness.       Right foot: There is normal range of motion, no tenderness and no deformity.  Range of motion 75% flexion, extension, lateral bending and rotation. Mild tenderness to palpation lumbar paraspinal muscles.  Neurological: She is alert and oriented to person, place, and time. Coordination and gait normal.  Sensation to pinprick is intact in bilateral saphenous sural and peroneal distribution. However, she does state on the right side. She has some paresthesias in the sural and superficial peroneal nerve distribution  Her motor strength is 5/5 bilateral hip flexor, knee extensor, ankle dorsiflexor and plantar flexor, great toe extensors 5/5 bilateral. Negative straight leg raising.   Psychiatric: She has a normal mood and affect. Her behavior is normal. Judgment and thought content normal.  Nursing note and vitals reviewed.         Assessment & Plan:  1. 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 Do not feel she meets the diagnostic criteria for CRPS type 1 ,2012 International Association for the Study of Pain (IASP) diagnostic criteria (also called Budapest criteria or Harden/Bruehl criteria)  continuing pain disproportionate to any inciting event   must report at least 1 symptom in ? 3 of the following categories  sensory - allodynia and/or hyperesthesia MET  vasomotor - temperature asymmetry, skin color changes, and/or skin color asymmetry   MET  sudomotor/edema - edema, sweating changes, and/or sweating asymmetry MET  motor/trophic - decreased range of motion, motor dysfunction (weakness, tremor, or dystonia), and/or trophic changes (in hair, nails, or skin) not MET-  must have at least 1 sign at time of evaluation in ? 2 of the following categories NOT MET sensory - allodynia (to light touch, deep somatic pressure, or joint movement) and/or hyperalgesia (to pinprick)vasomotor - temperature asymmetry (> 1 degree C [1.8 degrees F]), skin color changes, and/or skin color asymmetry Not Met  sudomotor/edema - evidence of edema, sweating changes, and/or sweating asymmetry Not MET  motor/trophic - evidence of decreased range of  motion, motor dysfunction (weakness, tremor, or dystonia), and/or trophic changes (in hair, nails, or skin) Not MET   2. Low back pain, this is likely due to decreased mobility, increased sedentary lifestyle. At this point, I do not have a strong suspicion for lumbar radiculopathy. We can check with EMG for any signs of this

## 2016-12-15 NOTE — Patient Instructions (Addendum)
You do not meet the diagnostic criteria for RSD  May add daytime dose of Gabapentin Check EMG

## 2016-12-18 ENCOUNTER — Other Ambulatory Visit: Payer: BLUE CROSS/BLUE SHIELD

## 2016-12-24 MED ORDER — TRAMADOL HCL 50 MG PO TABS: 50.0000 mg | ORAL_TABLET | Freq: Two times a day (BID) | ORAL | 0 refills | Status: DC | PRN

## 2016-12-24 NOTE — Telephone Encounter (Signed)
OK to refill tramadol. We are not to give anything stronger.

## 2016-12-29 ENCOUNTER — Other Ambulatory Visit: Payer: Self-pay | Admitting: Family Medicine

## 2016-12-30 ENCOUNTER — Ambulatory Visit: Payer: BLUE CROSS/BLUE SHIELD | Admitting: Podiatry

## 2017-01-05 ENCOUNTER — Ambulatory Visit: Payer: BLUE CROSS/BLUE SHIELD | Admitting: Podiatry

## 2017-01-06 ENCOUNTER — Ambulatory Visit (HOSPITAL_BASED_OUTPATIENT_CLINIC_OR_DEPARTMENT_OTHER)
Admission: RE | Admit: 2017-01-06 | Discharge: 2017-01-06 | Disposition: A | Discharge status: Home / Self Care | Payer: BLUE CROSS/BLUE SHIELD | Source: Ambulatory Visit | Attending: Podiatry | Admitting: Podiatry

## 2017-01-06 ENCOUNTER — Ambulatory Visit (INDEPENDENT_AMBULATORY_CARE_PROVIDER_SITE_OTHER): Payer: BLUE CROSS/BLUE SHIELD | Admitting: Podiatry

## 2017-01-06 ENCOUNTER — Ambulatory Visit (HOSPITAL_BASED_OUTPATIENT_CLINIC_OR_DEPARTMENT_OTHER): Payer: BLUE CROSS/BLUE SHIELD | Admitting: Physical Medicine & Rehabilitation

## 2017-01-06 ENCOUNTER — Encounter: Payer: Self-pay | Admitting: Physical Medicine & Rehabilitation

## 2017-01-06 ENCOUNTER — Encounter: Payer: BLUE CROSS/BLUE SHIELD | Attending: Physical Medicine & Rehabilitation

## 2017-01-06 ENCOUNTER — Encounter: Payer: Self-pay | Admitting: Podiatry

## 2017-01-06 VITALS — BP 131/86 | HR 100

## 2017-01-06 DIAGNOSIS — G8929 Other chronic pain: Secondary | ICD-10-CM

## 2017-01-06 DIAGNOSIS — F988 Other specified behavioral and emotional disorders with onset usually occurring in childhood and adolescence: Secondary | ICD-10-CM | POA: Diagnosis not present

## 2017-01-06 DIAGNOSIS — M25571 Pain in right ankle and joints of right foot: Secondary | ICD-10-CM | POA: Insufficient documentation

## 2017-01-06 DIAGNOSIS — Z9889 Other specified postprocedural states: Secondary | ICD-10-CM

## 2017-01-06 DIAGNOSIS — Z967 Presence of other bone and tendon implants: Secondary | ICD-10-CM

## 2017-01-06 DIAGNOSIS — Z8781 Personal history of (healed) traumatic fracture: Secondary | ICD-10-CM

## 2017-01-06 DIAGNOSIS — F419 Anxiety disorder, unspecified: Secondary | ICD-10-CM | POA: Diagnosis not present

## 2017-01-06 DIAGNOSIS — M899 Disorder of bone, unspecified: Secondary | ICD-10-CM

## 2017-01-06 DIAGNOSIS — M545 Low back pain: Secondary | ICD-10-CM | POA: Insufficient documentation

## 2017-01-06 DIAGNOSIS — K219 Gastro-esophageal reflux disease without esophagitis: Secondary | ICD-10-CM | POA: Diagnosis not present

## 2017-01-06 DIAGNOSIS — M779 Enthesopathy, unspecified: Secondary | ICD-10-CM | POA: Diagnosis not present

## 2017-01-06 DIAGNOSIS — F329 Major depressive disorder, single episode, unspecified: Secondary | ICD-10-CM | POA: Diagnosis not present

## 2017-01-06 DIAGNOSIS — Z8711 Personal history of peptic ulcer disease: Secondary | ICD-10-CM | POA: Diagnosis not present

## 2017-01-06 DIAGNOSIS — D649 Anemia, unspecified: Secondary | ICD-10-CM | POA: Insufficient documentation

## 2017-01-06 DIAGNOSIS — M949 Disorder of cartilage, unspecified: Secondary | ICD-10-CM

## 2017-01-06 DIAGNOSIS — K589 Irritable bowel syndrome without diarrhea: Secondary | ICD-10-CM | POA: Insufficient documentation

## 2017-01-06 MED ORDER — TRAMADOL HCL 50 MG PO TABS: 50.0000 mg | ORAL_TABLET | Freq: Two times a day (BID) | ORAL | 0 refills | Status: DC | PRN

## 2017-01-06 NOTE — Progress Notes (Signed)
Subjective: Diane [name] is a 36 y.o. is seen today in office s/p right ankle arthroscopy, microfacture and bone biopsy preformed on 10/22/16.She states that she is doing much better. She has been going to PT and she states that this has been helping quite a bit. Her swelling overall is much improved. She does go barefoot at the house without the cam boot she does well without any pain. She still gets some discomfort after doing physical therapy but her pain is much improved on a daily basis. She denies any redness or warmth to her ankle or foot. She denies any recent injury. Denies any systemic complaints such as fevers, chills, nausea, vomiting. No calf pain, chest pain, shortness of breath.   Objective: General: No acute distress, AAOx3; presents today with her husband.  DP/PT pulses palpable 2/4, CRT < 3 sec to all digits.  Protective sensation intact. Motor function intact.  Right ankle: Incision is well coapted without any evidence of dehiscence and a scar is well formed. There is no area of tenderness to the right ankle. There is minimal edema to the ankle there is no erythema or increased warmth. There is no pain or restriction or crepitation with ankle joint range of motion. There is no pain to the foot. There is no proximal tib-fib pain. There is no other areas of tenderness identified bilaterally. No other open lesions or pre-ulcerative lesions.  No pain with calf compression, swelling, warmth, erythema.   Assessment and Plan:  Status post right ankle arthroscopy s/p ankle ORIF and subsequent HWR with much improvement in symptoms.   -Treatment options discussed including all alternatives, risks, and complications -X-rays were again reviewed and obtained today. -discussed continue with physical therapy. She feels that she is still making gains by going through we'll continue with this. -She is scheduled for nerve conduction test today. -Refilled tramadol today to take as needed. She  feels that she still needs this after physical therapy. -She's been wearing the cam boot. She would like to transition to an ankle brace that she feels that she is ready for this. This was dispensed today. As she continues physical therapy she can wean off of the ankle brace as well.  -RTC in 4 weeks or sooner if needed.   Ovid CurdMatthew Preeti Winegardner, DPM  -Recommend her to restart physical therapy. New prescription was right today for the right ankle but also for the left foot. She can start to transition to regular shoe as tolerated. Once in a regular shoe do not go barefoot and discussed a supportive shoe.  -She has an upcoming appointment with Dr. Wynn BankerKirsteins.  -Follow-up in 4 weeks or sooner if needed.  Ovid CurdMatthew [place] Brogden, DPM

## 2017-01-06 NOTE — Patient Instructions (Addendum)
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 Review 1 mo

## 2017-01-06 NOTE — Progress Notes (Signed)
EMG performed today. Briefly, she had evidence of right superficial peroneal neuropathy as well as a distal right tibial neuropathy. Please see scanned report under media Center. It may be several weeks before report is scanned, however.  Patient to follow up with me in 1 month

## 2017-01-08 ENCOUNTER — Ambulatory Visit: Payer: BLUE CROSS/BLUE SHIELD | Admitting: Family Medicine

## 2017-01-14 ENCOUNTER — Telehealth: Payer: Self-pay | Admitting: Family Medicine

## 2017-01-14 NOTE — Telephone Encounter (Signed)
OK with me.

## 2017-01-14 NOTE — Telephone Encounter (Signed)
Ok to establish 

## 2017-01-14 NOTE — Telephone Encounter (Signed)
Pt asking if she could transfer care from Dr. [name] to Dr. Beverely Lowabori, Please advise ok to schedule.

## 2017-01-14 NOTE — Telephone Encounter (Signed)
Pt has been scheduled.  °

## 2017-01-15 ENCOUNTER — Institutional Professional Consult (permissible substitution): Payer: BLUE CROSS/BLUE SHIELD | Admitting: Pulmonary Disease

## 2017-01-22 NOTE — Telephone Encounter (Signed)
Ok to refill 

## 2017-01-23 MED ORDER — TRAMADOL HCL 50 MG PO TABS: 50.0000 mg | ORAL_TABLET | Freq: Two times a day (BID) | ORAL | 0 refills | Status: DC | PRN

## 2017-01-26 ENCOUNTER — Ambulatory Visit: Payer: BLUE CROSS/BLUE SHIELD | Admitting: Family Medicine

## 2017-01-27 ENCOUNTER — Ambulatory Visit: Payer: BLUE CROSS/BLUE SHIELD | Admitting: Podiatry

## 2017-02-03 ENCOUNTER — Encounter: Payer: BLUE CROSS/BLUE SHIELD | Attending: Physical Medicine & Rehabilitation

## 2017-02-03 ENCOUNTER — Ambulatory Visit: Payer: BLUE CROSS/BLUE SHIELD | Admitting: Physical Medicine & Rehabilitation

## 2017-02-03 ENCOUNTER — Encounter: Payer: BLUE CROSS/BLUE SHIELD | Admitting: Podiatry

## 2017-02-03 DIAGNOSIS — F419 Anxiety disorder, unspecified: Secondary | ICD-10-CM | POA: Insufficient documentation

## 2017-02-03 DIAGNOSIS — K219 Gastro-esophageal reflux disease without esophagitis: Secondary | ICD-10-CM | POA: Insufficient documentation

## 2017-02-03 DIAGNOSIS — F329 Major depressive disorder, single episode, unspecified: Secondary | ICD-10-CM | POA: Insufficient documentation

## 2017-02-03 DIAGNOSIS — F988 Other specified behavioral and emotional disorders with onset usually occurring in childhood and adolescence: Secondary | ICD-10-CM | POA: Insufficient documentation

## 2017-02-03 DIAGNOSIS — K589 Irritable bowel syndrome without diarrhea: Secondary | ICD-10-CM | POA: Insufficient documentation

## 2017-02-03 DIAGNOSIS — M545 Low back pain: Secondary | ICD-10-CM | POA: Insufficient documentation

## 2017-02-03 DIAGNOSIS — M25571 Pain in right ankle and joints of right foot: Secondary | ICD-10-CM | POA: Insufficient documentation

## 2017-02-03 DIAGNOSIS — Z8711 Personal history of peptic ulcer disease: Secondary | ICD-10-CM | POA: Insufficient documentation

## 2017-02-03 DIAGNOSIS — G8929 Other chronic pain: Secondary | ICD-10-CM | POA: Insufficient documentation

## 2017-02-03 DIAGNOSIS — D649 Anemia, unspecified: Secondary | ICD-10-CM | POA: Insufficient documentation

## 2017-02-09 NOTE — Progress Notes (Signed)
No show- Tried to call patient to reschedule

## 2017-02-25 ENCOUNTER — Encounter: Payer: Self-pay | Admitting: Pulmonary Disease

## 2017-02-25 ENCOUNTER — Other Ambulatory Visit (INDEPENDENT_AMBULATORY_CARE_PROVIDER_SITE_OTHER): Payer: BLUE CROSS/BLUE SHIELD

## 2017-02-25 ENCOUNTER — Ambulatory Visit (INDEPENDENT_AMBULATORY_CARE_PROVIDER_SITE_OTHER): Payer: BLUE CROSS/BLUE SHIELD | Admitting: Pulmonary Disease

## 2017-02-25 VITALS — BP 132/86 | HR 93 | Ht 67.0 in | Wt 223.0 lb

## 2017-02-25 DIAGNOSIS — R0683 Snoring: Secondary | ICD-10-CM | POA: Diagnosis not present

## 2017-02-25 DIAGNOSIS — G4721 Circadian rhythm sleep disorder, delayed sleep phase type: Secondary | ICD-10-CM | POA: Diagnosis not present

## 2017-02-25 DIAGNOSIS — D649 Anemia, unspecified: Secondary | ICD-10-CM

## 2017-02-25 DIAGNOSIS — G2581 Restless legs syndrome: Secondary | ICD-10-CM | POA: Diagnosis not present

## 2017-02-25 LAB — CBC WITH DIFFERENTIAL/PLATELET
Basophils Absolute: 0.1 10*3/uL (ref 0.0–0.1)
Basophils Relative: 0.6 % (ref 0.0–3.0)
EOS ABS: 0.4 10*3/uL (ref 0.0–0.7)
Eosinophils Relative: 4.9 % (ref 0.0–5.0)
HEMATOCRIT: 37.1 % (ref 36.0–46.0)
HEMOGLOBIN: 12.6 g/dL (ref 12.0–15.0)
Lymphocytes Relative: 26.2 % (ref 12.0–46.0)
Lymphs Abs: 2.4 10*3/uL (ref 0.7–4.0)
MCHC: 34.1 g/dL (ref 30.0–36.0)
MCV: 85.5 fl (ref 78.0–100.0)
MONOS PCT: 6.4 % (ref 3.0–12.0)
Monocytes Absolute: 0.6 10*3/uL (ref 0.1–1.0)
Neutro Abs: 5.6 10*3/uL (ref 1.4–7.7)
Neutrophils Relative %: 61.9 % (ref 43.0–77.0)
Platelets: 275 10*3/uL (ref 150.0–400.0)
RBC: 4.34 Mil/uL (ref 3.87–5.11)
RDW: 13 % (ref 11.5–15.5)
WBC: 9.1 10*3/uL (ref 4.0–10.5)

## 2017-02-25 LAB — IBC PANEL
Iron: 14 ug/dL — ABNORMAL LOW (ref 42–145)
Saturation Ratios: 4.1 % — ABNORMAL LOW (ref 20.0–50.0)
TRANSFERRIN: 242 mg/dL (ref 212.0–360.0)

## 2017-02-25 LAB — COMPREHENSIVE METABOLIC PANEL
ALBUMIN: 4.3 g/dL (ref 3.5–5.2)
ALK PHOS: 112 U/L (ref 39–117)
ALT: 30 U/L (ref 0–35)
AST: 24 U/L (ref 0–37)
BILIRUBIN TOTAL: 0.2 mg/dL (ref 0.2–1.2)
BUN: 13 mg/dL (ref 6–23)
CALCIUM: 9.4 mg/dL (ref 8.4–10.5)
CO2: 24 meq/L (ref 19–32)
CREATININE: 0.78 mg/dL (ref 0.40–1.20)
Chloride: 105 mEq/L (ref 96–112)
GFR: 89.58 mL/min (ref >60.00)
Glucose, Bld: 111 mg/dL — ABNORMAL HIGH (ref 70–99)
Potassium: 3.8 mEq/L (ref 3.5–5.1)
Sodium: 137 mEq/L (ref 135–145)
TOTAL PROTEIN: 7.8 g/dL (ref 6.0–8.3)

## 2017-02-25 NOTE — Progress Notes (Signed)
   Subjective:    Patient ID: Diane [name], female    DOB: May 03, 1981, 36 y.o.   MRN: [id number]  HPI    Review of Systems  Constitutional: Positive for unexpected weight change. Negative for fever.  HENT: Positive for dental problem, sore throat and trouble swallowing. Negative for ear pain, nosebleeds, postnasal drip, rhinorrhea, sinus pressure and sneezing.   Eyes: Negative for redness and itching.  Respiratory: Positive for cough and shortness of breath. Negative for chest tightness and wheezing.   Cardiovascular: Negative for palpitations and leg swelling.  Gastrointestinal: Negative for nausea and vomiting.       Acid heartburn / Indigestion  Genitourinary: Negative for dysuria.  Musculoskeletal: Positive for joint swelling.  Skin: Negative for rash.  Neurological: Positive for headaches.  Hematological: Does not bruise/bleed easily.  Psychiatric/Behavioral: Positive for dysphoric mood. The patient is nervous/anxious.        Objective:   Physical Exam        Assessment & Plan:

## 2017-02-25 NOTE — Progress Notes (Signed)
Past Surgical History She  has a past surgical history that includes Cesarean section (12/24/2009); Cesarean section (11/20/2011); laparoscopic appendectomy (N/A, 06/11/2014); Bladder surgery; Tonsillectomy and adenoidectomy; Wisdom tooth extraction; Hysteroscopy w/ endometrial ablation (03/05/2007); Chromopertubation (03/05/2007); Ovarian cyst surgery (Right, 03/05/2007); Cystoscopy with retrograde pyelogram, ureteroscopy and stent placement (Left, 08/10/2006); Synovectomy (Right, 12/13/2014); Knee arthroscopy with lateral release (Right, 12/13/2014); ORIF ankle fracture (Right, 01/05/2016); shunts  (2009); Hardware Removal (Right, 07/01/2016); Ankle arthroscopy (Right, 10/22/2016); and Cholecystectomy (N/A, 12/03/2016).  Allergies  Allergen Reactions  . Nsaids Other (See Comments)    DUE TO HISTORY OF GASTRIC ULCER  . Nickel     "any jewelry that's not real"   . Adhesive [Tape] Rash  . Metoclopramide Hcl Other (See Comments)    RESTLESSNESS/JITTERY  . Prochlorperazine Edisylate Other (See Comments)    RESTLESSNESS/JITTERY     Family History Her family history includes Anesthesia problems in her father; Brain cancer in her father; Breast cancer in her maternal grandmother; Diabetes in her maternal grandmother; Heart disease in her maternal grandmother; Irritable bowel syndrome in her mother; Lung cancer in her father; Melanoma in her maternal grandfather; Ovarian cancer in her maternal grandmother; Prostate cancer in her maternal grandfather.  Social History She  reports that she has been smoking Cigarettes.  She has a 7.50 pack-year smoking history. She has never used smokeless tobacco. She reports that she drinks alcohol. She reports that she does not use drugs.  Review of systems Constitutional: Positive for unexpected weight change. Negative for fever.  HENT: Positive for dental problem, sore throat and trouble swallowing. Negative for ear pain, nosebleeds, postnasal drip, rhinorrhea, sinus  pressure and sneezing.   Eyes: Negative for redness and itching.  Respiratory: Positive for cough and shortness of breath. Negative for chest tightness and wheezing.   Cardiovascular: Negative for palpitations and leg swelling.  Gastrointestinal: Negative for nausea and vomiting.       Acid heartburn / Indigestion  Genitourinary: Negative for dysuria.  Musculoskeletal: Positive for joint swelling.  Skin: Negative for rash.  Neurological: Positive for headaches.  Hematological: Does not bruise/bleed easily.  Psychiatric/Behavioral: Positive for dysphoric mood. The patient is nervous/anxious.     Current Outpatient Prescriptions on File Prior to Visit  Medication Sig  . diazepam (VALIUM) 5 MG tablet Take 1 tablet (5 mg total) by mouth every 12 (twelve) hours as needed for anxiety.  . dicyclomine (BENTYL) 10 MG capsule Take 1 capsule (10 mg total) by mouth 3 (three) times daily before meals.  . gabapentin (NEURONTIN) 300 MG capsule Take 2 capsules (600 mg total) by mouth 3 (three) times daily. (Patient taking differently: Take 600-900 mg by mouth 2 (two) times daily. Take 2 capsules (600 mg) in the morning and Take 3 capsules (900 mg) in the evening.)  . Magnesium 400 MG TABS Take 1,200 mg by mouth daily.   . naratriptan (AMERGE) 1 MG TABS tablet Take 1 tab at onset of headache; if returns or does not resolve, may repeat after 1 hours. Do not exceed more than this in 24 hrs.  . NONFORMULARY OR COMPOUNDED ITEM [facility]:  Peripheral Neuropathy Cream - Bupivacaine 1%, Doxepin 3%, Gabapentin 6%, Pentoxifylline 3%, Topiramate 1%, + Diclofenac 3%, apply 1-2 grams to affected area 3-4 times daily.  Marland Kitchen oxybutynin (DITROPAN-XL) 10 MG 24 hr tablet TAKE ONE TABLET BY MOUTH AT BEDTIME  . promethazine (PHENERGAN) 25 MG tablet Take 1 tablet (25 mg total) by mouth every 8 (eight) hours as needed for nausea or  vomiting.  . ranitidine (ZANTAC) 150 MG capsule Take 300 mg by mouth 2 (two) times daily.   Marland Kitchen  tiZANidine (ZANAFLEX) 4 MG tablet Take 4 mg by mouth every 6 (six) hours as needed for muscle spasms.   Marland Kitchen venlafaxine XR (EFFEXOR-XR) 150 MG 24 hr capsule Take 2 capsules (300 mg total) by mouth daily before breakfast.   No current facility-administered medications on file prior to visit.     Chief Complaint  Patient presents with  . SLEEP CONSULT    Referred by Dr Carmelia Roller. Not sleeping well at night and then falls asleep during the day. Epworth Score: 18 ; Pt thinks that she is getting a sinus infection x 4 days. Pt c/o sore throat, cough, hoarseness, PND and little mucus production.  Using Zyrtec and Tylenol Sinus/Congestion OTC.     Past medical history She  has a past medical history of ADD (attention deficit disorder with hyperactivity); Anemia; Anxiety; Cough (10/16/2016); Depression; Family history of adverse reaction to anesthesia; GERD (gastroesophageal reflux disease); History of gastric ulcer; History of kidney stones; IBS (irritable bowel syndrome); Migraines; Osteoarthritis; Osteochondral lesion (09/2016); Plantar fasciitis of left foot; PONV (postoperative nausea and vomiting); Pseudotumor cerebri; and Wears partial dentures.  Vital signs BP 132/86 (BP Location: Left Arm, Cuff Size: Normal)   Pulse 93   Ht  (1.702 m)   Wt 223 lb (101.2 kg)   SpO2 95%   BMI 34.93 kg/m   History of Present Illness Diane [name] is a 36 y.o. female for evaluation of sleep problems.  She has trouble falling asleep and staying asleep.  She has been told that she is a loud snorer, and will stop breathing while asleep.  She has trouble sleeping on her back.  She is tired all the time.  She goes to sleep between 1 am and 3 am.  She doesn't usually feel tired prior to this  She falls asleep after 30 minutes.  She wakes up some times to use the bathroom.  She gets out of bed between 7 and 8 am.  She feels tired in the morning.  She will go back to sleep later in the morning if she can.  She  sometimes gets morning headache.  She does not use anything to help her fall sleep or stay awake.  She notices having funny feelings in her legs at night.  These happen several times per week, and get better if she moves her legs.  She denies sleep walking, sleep talking, bruxism, or nightmares.  She denies sleep hallucinations, sleep paralysis, or cataplexy.  The Epworth score is 18 out of 24.   Physical Exam:  General - No distress ENT - No sinus tenderness, no oral exudate, no LAN, no thyromegaly, TM clear, pupils equal/reactive Cardiac - s1s2 regular, no murmur, pulses symmetric Chest - No wheeze/rales/dullness, good air entry, normal respiratory excursion Back - No focal tenderness Abd - Soft, non-tender, no organomegaly, + bowel sounds Ext - No edema Neuro - Normal strength, cranial nerves intact Skin - No rashes Psych - Normal mood, and behavior  Discussion: She has snoring, sleep disruption, witnessed apnea and daytime sleepiness.  She might have obstructive sleep apnea.  She has difficulty falling asleep prior to 1 am.  She has symptoms suggestive of restless leg syndrome.  We discussed how sleep apnea can affect various health problems, including risks for hypertension, cardiovascular disease, and diabetes.  We also discussed how sleep disruption can increase risks for accidents,  such as while driving.  Weight loss as a means of improving sleep apnea was also reviewed.  Additional treatment options discussed were CPAP therapy, oral appliance, and surgical intervention.  Assessment/plan:  Snoring with concern for obstructive sleep apnea. - will arrange for home sleep study  Restless leg syndrome. - will check labs including iron levels  Delayed sleep phase. - defer further intervention for this until after review of her labs and sleep study   Patient Instructions  Lab tests today  Will arrange for home sleep study  Will call to schedule follow up after home sleep  study reviewed   Coralyn Helling, M.D. Pager [phone] 02/25/2017, 3:36 PM

## 2017-02-25 NOTE — Patient Instructions (Signed)
Lab tests today  Will arrange for home sleep study  Will call to schedule follow up after home sleep study reviewed

## 2017-02-26 ENCOUNTER — Ambulatory Visit (INDEPENDENT_AMBULATORY_CARE_PROVIDER_SITE_OTHER): Payer: BLUE CROSS/BLUE SHIELD | Admitting: Podiatry

## 2017-02-26 DIAGNOSIS — M25571 Pain in right ankle and joints of right foot: Secondary | ICD-10-CM

## 2017-02-26 DIAGNOSIS — M722 Plantar fascial fibromatosis: Secondary | ICD-10-CM | POA: Diagnosis not present

## 2017-02-26 DIAGNOSIS — M899 Disorder of bone, unspecified: Secondary | ICD-10-CM | POA: Diagnosis not present

## 2017-02-26 DIAGNOSIS — G8929 Other chronic pain: Secondary | ICD-10-CM

## 2017-02-26 DIAGNOSIS — M7751 Other enthesopathy of right foot: Secondary | ICD-10-CM

## 2017-02-26 DIAGNOSIS — M949 Disorder of cartilage, unspecified: Secondary | ICD-10-CM

## 2017-02-26 MED ORDER — TRAMADOL HCL 50 MG PO TABS: 50.0000 mg | ORAL_TABLET | Freq: Three times a day (TID) | ORAL | 0 refills | Status: DC | PRN

## 2017-02-27 ENCOUNTER — Telehealth: Payer: Self-pay | Admitting: Pulmonary Disease

## 2017-02-27 NOTE — Telephone Encounter (Signed)
Spoke with pt and notified of results per Dr. Sood. Pt verbalized understanding and denied any questions.  

## 2017-02-27 NOTE — Progress Notes (Addendum)
Subjective: 36 year old female presents the office they for follow-up evaluation of right ankle pain, left heel pain/plantar fasciitis. She states that she has been wearing the ankle brace and the right side but she still gets pain of the ankle. She has not been taking tramadol she's been taking a lot of ibuprofen which is starting to upset her stomach. She points the medial ankle just behind the malleolus which she has majority pain. She also states that on the left side she still gets pain in the bottom of the heel is starting to up the Achilles tendon. She denies any recent injury. Denies any systemic complaints such as fevers, chills, nausea, vomiting. No acute changes since last appointment, and no other complaints at this time.   Objective: AAO x3, NAD DP/PT pulses palpable bilaterally, CRT less than 3 seconds On the right side there is tenderness most notably along the course of the flexor tendon just posterior to the medial malleolus. Negative Tinel sign is present. There is no significant edema, erythema. There is no significant tenderness the lateral aspect of ankle. Ankle joint range of motion somewhat reduced but there is no crepitation. There is minimal edema to the ankle and her swelling overall is much improved compared to what it was prior to the surgery. On the left side is continued tenderness palpation along the plantar medial tubercle of the calcaneus at insertion of plantar fascia. There is no pain with lateral compression of the calcaneus. No pain along the Achilles tendon and Thompson's has a negative. No open lesions or pre-ulcerative lesions.  No pain with calf compression, swelling, warmth, erythema  Assessment: Flexor tendinitis right ankle, plantar fasciitis left foot  Plan: -All treatment options discussed with the patient including all alternatives, risks, complications.  -Patient elects to proceed with steroid injection into the left heel. Under sterile skin  preparation, a total of 2.5cc of kenalog 10, 0.5% Marcaine plain, and 2% lidocaine plain were infiltrated into the symptomatic area without complication. A band-aid was applied. Patient tolerated the injection well without complication. Post-injection care with discussed with the patient. Discussed with the patient to ice the area over the next couple of days to help prevent a steroid flare.  -Plantar fascial brace left side. -She is asking for a steroid injection the right ankle. Small dose of Kenalog and local anesthetic was infiltrated along the area of maximal tenderness the medial aspect of the ankle without any complications. Post injection care was discussed. -Tramadol was refilled today. No further ibuprofen/ NSAIDs -Given the continued pain to the right ankle and given the fracture as well as early also arthritis at this point I recommended to Richie brace for the right ankle. Also a custom insert for the left side given the heel pain. I also had with evaluate her today for this. We will check with insurance and have her come back for casting.  Ovid Curd, DPM

## 2017-02-27 NOTE — Telephone Encounter (Signed)
CMP Latest Ref Rng & Units 02/25/2017 12/04/2016 12/03/2016  Glucose 70 - 99 mg/dL 161(W) 960(A) 99  BUN 6 - 23 mg/dL Creatinine 0.40 - 1.20 mg/dL 5.40 9.81 1.91  Sodium 135 - 145 mEq/L 137 138 135  Potassium 3.5 - 5.1 mEq/L 3.8 4.4 3.9  Chloride 96 - 112 mEq/L 105 105 103  CO2 19 - 32 mEq/L Calcium 8.4 - 10.5 mg/dL 9.4 8.9 4.7(W)  Total Protein 6.0 - 8.3 g/dL 7.8 6.5 6.0(L)  Total Bilirubin 0.2 - 1.2 mg/dL 0.2 0.3 0.3  Alkaline Phos 39 - 117 U/L 112 164(H) 171(H)  AST 0 - 37 U/L 24 121(H) 314(H)  ALT 0 - 35 U/L 30 159(H) 217(H)    CBC Latest Ref Rng & Units 02/25/2017 12/03/2016 12/02/2016  WBC 4.0 - 10.5 K/uL 9.1 9.0 13.4(H)  Hemoglobin 12.0 - 15.0 g/dL 29.5 11.4(L) 13.0  Hematocrit 36.0 - 46.0 % 37.1 34.9(L) 38.7  Platelets 150.0 - 400.0 K/uL 275.0 227 292    Iron/TIBC/Ferritin/ %Sat    Component Value Date/Time   IRON 14 (L) 02/25/2017 1614   IRONPCTSAT 4.1 (L) 02/25/2017 1614    Will have my nurse inform pt that iron levels were low, and this explain why she is getting leg symptoms at night.  She needs to start taking ferrous sulfate 325 mg bid with meals, and vitamin c 200 mg bid.  She should not take ferrous sulfate at same time as dairy products or calcium supplements.

## 2017-03-02 ENCOUNTER — Other Ambulatory Visit: Payer: Self-pay | Admitting: Family Medicine

## 2017-03-03 ENCOUNTER — Encounter: Payer: Self-pay | Admitting: Sports Medicine

## 2017-03-03 ENCOUNTER — Ambulatory Visit (INDEPENDENT_AMBULATORY_CARE_PROVIDER_SITE_OTHER): Payer: BLUE CROSS/BLUE SHIELD | Admitting: Sports Medicine

## 2017-03-03 ENCOUNTER — Ambulatory Visit (INDEPENDENT_AMBULATORY_CARE_PROVIDER_SITE_OTHER): Payer: BLUE CROSS/BLUE SHIELD | Admitting: *Deleted

## 2017-03-03 DIAGNOSIS — M7751 Other enthesopathy of right foot: Secondary | ICD-10-CM | POA: Diagnosis not present

## 2017-03-03 DIAGNOSIS — M722 Plantar fascial fibromatosis: Secondary | ICD-10-CM

## 2017-03-03 DIAGNOSIS — M79673 Pain in unspecified foot: Secondary | ICD-10-CM

## 2017-03-03 DIAGNOSIS — G8929 Other chronic pain: Secondary | ICD-10-CM

## 2017-03-03 DIAGNOSIS — M25571 Pain in right ankle and joints of right foot: Secondary | ICD-10-CM

## 2017-03-03 DIAGNOSIS — M79672 Pain in left foot: Secondary | ICD-10-CM

## 2017-03-03 DIAGNOSIS — M79671 Pain in right foot: Secondary | ICD-10-CM

## 2017-03-03 MED ORDER — TRIAMCINOLONE ACETONIDE 10 MG/ML IJ SUSP: 10.0000 mg | Freq: Once | INTRAMUSCULAR | Status: DC

## 2017-03-03 MED ORDER — ACETAMINOPHEN-CODEINE #3 300-30 MG PO TABS: 1.0000 | ORAL_TABLET | Freq: Four times a day (QID) | ORAL | 0 refills | Status: DC | PRN

## 2017-03-03 NOTE — Telephone Encounter (Signed)
Tylenol # 3 is all I can offer -Dr. Kathie Rhodes

## 2017-03-03 NOTE — Progress Notes (Signed)
Subjective: Diane [name] is a 36 y.o. female returns to office for follow up evaluation after Left heel pain and right ankle pain. Patient was added on to schedule because she's in pain was seen also today by [facility] for bracing. Reports severe pain after 1 hour of use. Had an injection last week states that it helped a little but other areas are worse. States Tramadol is not helping.   Patient Active Problem List   Diagnosis Date Noted  . Nausea and vomiting 12/03/2016  . RUQ pain   . Cholelithiasis and acute cholecystitis without obstruction 12/02/2016  . Ankle fracture 01/05/2016  . Chronic pain of right knee 04/12/2015  . Gastric ulcer 03/16/2015  . Chronic cough 12/27/2013  . Pleural effusion 12/15/2013  . DEPRESSION, ACUTE 10/02/2008  . NARCOTIC ABUSE 10/02/2008  . BENIGN INTRACRANIAL HYPERTENSION 06/16/2008  . BACK PAIN 03/28/2008  . WEIGHT GAIN 01/25/2008  . ANXIETY STATE, UNSPECIFIED 11/09/2007  . HEADACHE 07/09/2007    Current Outpatient Prescriptions on File Prior to Visit  Medication Sig Dispense Refill  . cetirizine (ZYRTEC) 10 MG tablet Take 10 mg by mouth daily.    . diazepam (VALIUM) 5 MG tablet Take 1 tablet (5 mg total) by mouth every 12 (twelve) hours as needed for anxiety. 60 tablet 5  . dicyclomine (BENTYL) 10 MG capsule TAKE ONE CAPSULE BY MOUTH 3 TIMES A DAY WITH MEALS 90 capsule 0  . gabapentin (NEURONTIN) 300 MG capsule Take 2 capsules (600 mg total) by mouth 3 (three) times daily. (Patient taking differently: Take 600-900 mg by mouth 2 (two) times daily. Take 2 capsules (600 mg) in the morning and Take 3 capsules (900 mg) in the evening.) 180 capsule 5  . Magnesium 400 MG TABS Take 1,200 mg by mouth daily.     . naratriptan (AMERGE) 1 MG TABS tablet Take 1 tab at onset of headache; if returns or does not resolve, may repeat after 1 hours. Do not exceed more than this in 24 hrs. 10 tablet 0  . NONFORMULARY OR COMPOUNDED ITEM [facility]:  Peripheral  Neuropathy Cream - Bupivacaine 1%, Doxepin 3%, Gabapentin 6%, Pentoxifylline 3%, Topiramate 1%, + Diclofenac 3%, apply 1-2 grams to affected area 3-4 times daily. 120 each 2  . oxybutynin (DITROPAN-XL) 10 MG 24 hr tablet TAKE ONE TABLET BY MOUTH AT BEDTIME 30 tablet 5  . promethazine (PHENERGAN) 25 MG tablet Take 1 tablet (25 mg total) by mouth every 8 (eight) hours as needed for nausea or vomiting. 30 tablet 0  . ranitidine (ZANTAC) 150 MG capsule Take 300 mg by mouth 2 (two) times daily.     Marland Kitchen tiZANidine (ZANAFLEX) 4 MG tablet Take 4 mg by mouth every 6 (six) hours as needed for muscle spasms.     . traMADol (ULTRAM) 50 MG tablet Take 1 tablet (50 mg total) by mouth every 8 (eight) hours as needed. 30 tablet 0  . venlafaxine XR (EFFEXOR-XR) 150 MG 24 hr capsule Take 2 capsules (300 mg total) by mouth daily before breakfast. 60 capsule 11   No current facility-administered medications on file prior to visit.     Allergies  Allergen Reactions  . Nsaids Other (See Comments)    DUE TO HISTORY OF GASTRIC ULCER  . Nickel     "any jewelry that's not real"   . Adhesive [Tape] Rash  . Metoclopramide Hcl Other (See Comments)    RESTLESSNESS/JITTERY  . Prochlorperazine Edisylate Other (See Comments)    RESTLESSNESS/JITTERY  Objective:   General:  Alert and oriented x 3, in no acute distress  Dermatology: Skin is warm, dry, and supple bilateral. Nails are within normal limits. There is no lower extremity erythema, no eccymosis, no open lesions present bilateral. Old surgical scars well healed.   Vascular: Dorsalis Pedis and Posterior Tibial pedal pulses are 2/4 bilateral. + hair growth noted bilateral. Capillary Fill Time is 3 seconds in all digits. No varicosities, No edema bilateral lower extremities.   Neurological: Sensation grossly intact to light touch bilateral.  Musculoskeletal: There is pain out of proportion at left plantar fascia and medial and lateral ankle on right with most  pain at right lateral ankle with guarding and limitation. No pain to calf. Thompson test negative. Strength 5/5 bilateral.   Assessment and Plan: Problem List Items Addressed This Visit    None    Visit Diagnoses    Right ankle tendonitis    -  Primary   Relevant Medications   triamcinolone acetonide (KENALOG) 10 MG/ML injection 10 mg (Start on 03/03/2017 12:30 PM)   Plantar fasciitis, left       Foot pain, bilateral       Relevant Medications   acetaminophen-codeine (TYLENOL #3) 300-30 MG tablet   Chronic pain of right ankle          -Complete examination performed.  -Previous x-rays reviewed. -Discussed with patient in detail the condition of plantar fasciitis on left and chronic ankle pain with tendonitis on right, how this  occurs related to the foot type of the patient and general treatment options. - Patient opted for another injection today; After oral consent and aseptic prep, injected a mixture containing 1 ml of 1%plain lidocaine, 1 ml 0.5% plain marcaine, 0.5 ml of kenalog 10 and 0.5 ml of dexmethasone phosphate to right lateral ankle at area of most pain/trigger point injection. -Dispensed heel lifts bilateral  -Rx a small one time dose of Tylenol #3. ADVISED PATIENT THAT I WILL NOT GIVE HER ANYTHING ELSE STRONGER.  -Continue with stretching, icing, good supportive shoes, inserts daily.  -Patient to return to office pick up brace(s) or sooner if problems or questions arise.  Asencion Islam, DPM

## 2017-03-03 NOTE — Telephone Encounter (Signed)
She is only allowed to get Tramadol.

## 2017-03-10 ENCOUNTER — Emergency Department (HOSPITAL_BASED_OUTPATIENT_CLINIC_OR_DEPARTMENT_OTHER)
Admission: EM | Admit: 2017-03-10 | Discharge: 2017-03-10 | Disposition: A | Discharge status: Home / Self Care | Payer: BLUE CROSS/BLUE SHIELD | Attending: Emergency Medicine | Admitting: Emergency Medicine

## 2017-03-10 ENCOUNTER — Emergency Department (HOSPITAL_BASED_OUTPATIENT_CLINIC_OR_DEPARTMENT_OTHER): Payer: BLUE CROSS/BLUE SHIELD

## 2017-03-10 ENCOUNTER — Encounter (HOSPITAL_BASED_OUTPATIENT_CLINIC_OR_DEPARTMENT_OTHER): Payer: Self-pay | Admitting: *Deleted

## 2017-03-10 ENCOUNTER — Telehealth: Payer: Self-pay | Admitting: Pulmonary Disease

## 2017-03-10 DIAGNOSIS — R1013 Epigastric pain: Secondary | ICD-10-CM | POA: Insufficient documentation

## 2017-03-10 DIAGNOSIS — Z79899 Other long term (current) drug therapy: Secondary | ICD-10-CM | POA: Diagnosis not present

## 2017-03-10 DIAGNOSIS — R1011 Right upper quadrant pain: Secondary | ICD-10-CM | POA: Insufficient documentation

## 2017-03-10 DIAGNOSIS — F17210 Nicotine dependence, cigarettes, uncomplicated: Secondary | ICD-10-CM | POA: Diagnosis not present

## 2017-03-10 LAB — CBC WITH DIFFERENTIAL/PLATELET
BASOS ABS: 0 10*3/uL (ref 0.0–0.1)
BASOS PCT: 0 %
EOS ABS: 0.5 10*3/uL (ref 0.0–0.7)
Eosinophils Relative: 4 %
HCT: 39.3 % (ref 36.0–46.0)
HEMOGLOBIN: 13 g/dL (ref 12.0–15.0)
Lymphocytes Relative: 21 %
Lymphs Abs: 2.4 10*3/uL (ref 0.7–4.0)
MCH: 29.3 pg (ref 26.0–34.0)
MCHC: 33.1 g/dL (ref 30.0–36.0)
MCV: 88.7 fL (ref 78.0–100.0)
Monocytes Absolute: 0.7 10*3/uL (ref 0.1–1.0)
Monocytes Relative: 6 %
NEUTROS ABS: 7.9 10*3/uL — AB (ref 1.7–7.7)
NEUTROS PCT: 69 %
Platelets: 315 10*3/uL (ref 150–400)
RBC: 4.43 MIL/uL (ref 3.87–5.11)
RDW: 12.9 % (ref 11.5–15.5)
WBC: 11.5 10*3/uL — ABNORMAL HIGH (ref 4.0–10.5)

## 2017-03-10 LAB — URINALYSIS, ROUTINE W REFLEX MICROSCOPIC
BILIRUBIN URINE: NEGATIVE
Glucose, UA: NEGATIVE mg/dL
HGB URINE DIPSTICK: NEGATIVE
KETONES UR: NEGATIVE mg/dL
LEUKOCYTES UA: NEGATIVE
Nitrite: NEGATIVE
PROTEIN: NEGATIVE mg/dL
Specific Gravity, Urine: 1.011 (ref 1.005–1.030)
pH: 5 (ref 5.0–8.0)

## 2017-03-10 LAB — COMPREHENSIVE METABOLIC PANEL
ALT: 25 U/L (ref 14–54)
ANION GAP: 10 (ref 5–15)
AST: 26 U/L (ref 15–41)
Albumin: 4 g/dL (ref 3.5–5.0)
Alkaline Phosphatase: 93 U/L (ref 38–126)
BILIRUBIN TOTAL: 0.2 mg/dL — AB (ref 0.3–1.2)
BUN: 14 mg/dL (ref 6–20)
CALCIUM: 9.6 mg/dL (ref 8.9–10.3)
CO2: 25 mmol/L (ref 22–32)
CREATININE: 0.74 mg/dL (ref 0.44–1.00)
Chloride: 100 mmol/L — ABNORMAL LOW (ref 101–111)
Glucose, Bld: 98 mg/dL (ref 65–99)
Potassium: 4.1 mmol/L (ref 3.5–5.1)
Sodium: 135 mmol/L (ref 135–145)
TOTAL PROTEIN: 7.9 g/dL (ref 6.5–8.1)

## 2017-03-10 LAB — LIPASE, BLOOD: Lipase: 20 U/L (ref 11–51)

## 2017-03-10 LAB — PREGNANCY, URINE: Preg Test, Ur: NEGATIVE

## 2017-03-10 MED ORDER — ONDANSETRON HCL 4 MG/2ML IJ SOLN
4.0000 mg | Freq: Once | INTRAMUSCULAR | Status: AC
  Administered 2017-03-10: 4 mg via INTRAVENOUS
  Filled 2017-03-10: qty 2

## 2017-03-10 MED ORDER — IOPAMIDOL (ISOVUE-300) INJECTION 61%
100.0000 mL | Freq: Once | INTRAVENOUS | Status: AC | PRN
  Administered 2017-03-10: 100 mL via INTRAVENOUS

## 2017-03-10 MED ORDER — SUCRALFATE 1 G PO TABS: 1.0000 g | ORAL_TABLET | Freq: Three times a day (TID) | ORAL | 0 refills | Status: DC

## 2017-03-10 MED ORDER — PANTOPRAZOLE SODIUM 40 MG IV SOLR
40.0000 mg | Freq: Once | INTRAVENOUS | Status: AC
  Administered 2017-03-10: 40 mg via INTRAVENOUS
  Filled 2017-03-10: qty 40

## 2017-03-10 MED ORDER — MORPHINE SULFATE (PF) 4 MG/ML IV SOLN
4.0000 mg | Freq: Once | INTRAVENOUS | Status: AC
  Administered 2017-03-10: 4 mg via INTRAVENOUS
  Filled 2017-03-10: qty 1

## 2017-03-10 MED ORDER — HYDROMORPHONE HCL 1 MG/ML IJ SOLN
1.0000 mg | Freq: Once | INTRAMUSCULAR | Status: AC
  Administered 2017-03-10: 1 mg via INTRAVENOUS
  Filled 2017-03-10: qty 1

## 2017-03-10 MED ORDER — SODIUM CHLORIDE 0.9 % IV BOLUS (SEPSIS)
1000.0000 mL | Freq: Once | INTRAVENOUS | Status: AC
  Administered 2017-03-10: 1000 mL via INTRAVENOUS

## 2017-03-10 MED ORDER — GI COCKTAIL ~~LOC~~
30.0000 mL | Freq: Once | ORAL | Status: AC
  Administered 2017-03-10: 30 mL via ORAL
  Filled 2017-03-10: qty 30

## 2017-03-10 MED ORDER — SUCRALFATE 1 G PO TABS
1.0000 g | ORAL_TABLET | Freq: Once | ORAL | Status: AC
  Administered 2017-03-10: 1 g via ORAL
  Filled 2017-03-10: qty 1

## 2017-03-10 MED FILL — SUCRALFATE 1 GM TABLET: 1 | 10 days supply | Qty: 40 | Fill #0

## 2017-03-10 NOTE — ED Triage Notes (Signed)
Left upper abdominal quad pain since last pm. Pain goes into her back. Hx of kidney stones.

## 2017-03-10 NOTE — Telephone Encounter (Signed)
Pt aware of recs.  Nothing further needed. 

## 2017-03-10 NOTE — Telephone Encounter (Signed)
Spoke with pt, who states since being instructed by [facility] to start iron at vitamin C on 02/27/17 pt has had abdominal pain. Pt reports of cramping/stabbing pain under breast line into left side of her back. Pt states pain occurred one week ago but has worsen x2d. Pt has had normal bowel movements. Pt states her urine is discolored (orange/redish) Pt does not currently have PCP but has a scheduled appt for June, and would like recommendations. Pt has hx of kidney stone and gallstone and had gallbladder removed 11/2016.  VS please advise. Thanks.

## 2017-03-10 NOTE — Telephone Encounter (Signed)
Pt returned called and states that the p[ain is right under breast and goes into her chest, is going to contact surgeon and see if he has some suggestions as well, please advise.Caren Griffins

## 2017-03-10 NOTE — ED Notes (Signed)
Patient transported to CT 

## 2017-03-10 NOTE — ED Notes (Signed)
Pt on monitor 

## 2017-03-10 NOTE — Telephone Encounter (Signed)
Urine discoloration and abdominal cramping likely from ferrous sulfate and vitamin c.    She should stopping taking these for now.  Once her symptoms resolve she can try restarting ferrous sulfate and vitamin C, but only take once per day.  If her symptoms don't improve after stopping ferrous sulfate and vitamin C, then she will need an ROV.

## 2017-03-10 NOTE — ED Notes (Signed)
Given gingerale and instructed to take sips

## 2017-03-10 NOTE — Telephone Encounter (Signed)
She can only get tramadol  q8h. She has seen pain management and they have said only tramadol for pain.   We had also discussed a therapist for chronic pain. I have the information in the [place] office. I will get it for you tomorrow, please remind me. She told me she was interested in doing this.

## 2017-03-10 NOTE — Telephone Encounter (Signed)
lmtcb X2 for pt to relay recs.  

## 2017-03-10 NOTE — Telephone Encounter (Signed)
lmtcb X1 for pt  

## 2017-03-10 NOTE — Discharge Instructions (Signed)
Read the information below.  Use the prescribed medication as directed.  Please discuss all new medications with your pharmacist.  You may return to the Emergency Department at any time for worsening condition or any new symptoms that concern you.   If you develop high fevers, worsening abdominal pain, uncontrolled vomiting, or are unable to tolerate fluids by mouth, return to the ER for a recheck.  ° °

## 2017-03-10 NOTE — ED Provider Notes (Signed)
[facility] Provider Note   CSN: [id number] Arrival date & time: 03/10/17  1313     History   Chief Complaint Chief Complaint  Patient presents with  . Abdominal Pain  . Back Pain    HPI Diane [name] is a 36 y.o. female.  HPI   Patient p/w upper abdominal pain that radiates around to her left midback that began last night.  The pain is sharp and stabbing.  Associated nausea.  Last night she had central chest pressure that lasted 2 hours.  Has had cough x 1 month, productive of green and sometimes bloody sputum.  Is having urinary frequency.  Denies SOB, bowel changes, vaginal symptoms.   LMP April 10.  Has hx PUD is not supposed to take NSAIDs but has recently due to foot pain from prior fracture.  Occasional ETOH use.   Has prior abdominal surgeries of cholecystectomy (11/2016 with Dr Gerrit Friends) and appendectomy.    Past Medical History:  Diagnosis Date  . ADD (attention deficit disorder with hyperactivity)    no current med.  . Anemia    takes iron supplement  . Anxiety   . Cough 10/16/2016  . Depression   . Family history of adverse reaction to anesthesia    pt's father has hx. of being hard to wake up post-op  . GERD (gastroesophageal reflux disease)   . History of gastric ulcer   . History of kidney stones   . IBS (irritable bowel syndrome)   . Migraines   . Osteoarthritis    right ankle  . Osteochondral lesion 09/2016   right ankle  . Plantar fasciitis of left foot   . PONV (postoperative nausea and vomiting)   . Pseudotumor cerebri   . Wears partial dentures    upper    Patient Active Problem List   Diagnosis Date Noted  . Nausea and vomiting 12/03/2016  . RUQ pain   . Cholelithiasis and acute cholecystitis without obstruction 12/02/2016  . Ankle fracture 01/05/2016  . Chronic pain of right knee 04/12/2015  . Gastric ulcer 03/16/2015  . Chronic cough 12/27/2013  . Pleural effusion 12/15/2013  . DEPRESSION, ACUTE 10/02/2008  . NARCOTIC  ABUSE 10/02/2008  . BENIGN INTRACRANIAL HYPERTENSION 06/16/2008  . BACK PAIN 03/28/2008  . WEIGHT GAIN 01/25/2008  . ANXIETY STATE, UNSPECIFIED 11/09/2007  . HEADACHE 07/09/2007    Past Surgical History:  Procedure Laterality Date  . ANKLE ARTHROSCOPY Right 10/22/2016   Procedure: ANKLE ARTHROSCOPY;  Surgeon: Vivi Barrack, DPM;  Location: [facility];  Service: Podiatry;  Laterality: Right;  GENERAL/REG BLOCK  . BLADDER SURGERY     x 2 - as a child to stretch bladder  . CESAREAN SECTION  12/24/2009  . CESAREAN SECTION  11/20/2011   Procedure: CESAREAN SECTION;  Surgeon: Lenoard Aden, MD;  Location: [facility];  Service: Gynecology;  Laterality: N/A;  . CHOLECYSTECTOMY N/A 12/03/2016   Procedure: LAPAROSCOPIC CHOLECYSTECTOMY WITH INTRAOPERATIVE CHOLANGIOGRAM;  Surgeon: Darnell Level, MD;  Location: [facility];  Service: General;  Laterality: N/A;  . CHROMOPERTUBATION  03/05/2007  . CYSTOSCOPY WITH RETROGRADE PYELOGRAM, URETEROSCOPY AND STENT PLACEMENT Left 08/10/2006  . HARDWARE REMOVAL Right 07/01/2016   Procedure: HARDWARE REMOVAL;  Surgeon: Cammy Copa, MD;  Location: [facility];  Service: Orthopedics;  Laterality: Right;  . HYSTEROSCOPY W/ ENDOMETRIAL ABLATION  03/05/2007  . KNEE ARTHROSCOPY WITH LATERAL RELEASE Right 12/13/2014   Procedure: KNEE ARTHROSCOPY WITH LATERAL RELEASE;  Surgeon: Thera Flake.,  MD;  Location: [facility];  Service: Orthopedics;  Laterality: Right;  . LAPAROSCOPIC APPENDECTOMY N/A 06/11/2014   Procedure: APPENDECTOMY LAPAROSCOPIC;  Surgeon: Velora Heckler, MD;  Location: [facility];  Service: General;  Laterality: N/A;  . ORIF ANKLE FRACTURE Right 01/05/2016   Procedure: OPEN REDUCTION INTERNAL FIXATION (ORIF) ANKLE FRACTURE;  Surgeon: Cammy Copa, MD;  Location: [facility];  Service: Orthopedics;  Laterality: Right;  . OVARIAN CYST SURGERY Right 03/05/2007  . shunts   2009   tried to do shunts for pseudotumor   . SYNOVECTOMY Right  12/13/2014   Procedure: PLICA SYNOVECTOMY;  Surgeon: Thera Flake., MD;  Location: [facility];  Service: Orthopedics;  Laterality: Right;  . TONSILLECTOMY AND ADENOIDECTOMY    . WISDOM TOOTH EXTRACTION      OB History    Gravida Para Term Preterm AB Living   0 2   SAB TAB Ectopic Multiple Live Births   0 0 0 0 1       Home Medications    Prior to Admission medications   Medication Sig Start Date End Date Taking? Authorizing Provider  acetaminophen-codeine (TYLENOL #3) 300-30 MG tablet Take 1-2 tablets by mouth every 6 (six) hours as needed for moderate pain. 03/03/17   Asencion Islam, DPM  cetirizine (ZYRTEC) 10 MG tablet Take 10 mg by mouth daily.    Historical Provider, MD  diazepam (VALIUM) 5 MG tablet Take 1 tablet (5 mg total) by mouth every 12 (twelve) hours as needed for anxiety. 11/05/16   Nelwyn Salisbury, MD  dicyclomine (BENTYL) 10 MG capsule TAKE ONE CAPSULE BY MOUTH 3 TIMES A DAY WITH MEALS 03/02/17   Nelwyn Salisbury, MD  gabapentin (NEURONTIN) 300 MG capsule Take 2 capsules (600 mg total) by mouth 3 (three) times daily. Patient taking differently: Take 600-900 mg by mouth 2 (two) times daily. Take 2 capsules (600 mg) in the morning and Take 3 capsules (900 mg) in the evening. 11/06/16   Nelwyn Salisbury, MD  Magnesium 400 MG TABS Take 1,200 mg by mouth daily.     Historical Provider, MD  naratriptan (AMERGE) 1 MG TABS tablet Take 1 tab at onset of headache; if returns or does not resolve, may repeat after 1 hours. Do not exceed more than this in 24 hrs. 12/08/16   Sharlene Dory, DO  NONFORMULARY OR COMPOUNDED ITEM [facility]:  Peripheral Neuropathy Cream - Bupivacaine 1%, Doxepin 3%, Gabapentin 6%, Pentoxifylline 3%, Topiramate 1%, + Diclofenac 3%, apply 1-2 grams to affected area 3-4 times daily. 11/04/16   Vivi Barrack, DPM  oxybutynin (DITROPAN-XL) 10 MG 24 hr tablet TAKE ONE TABLET BY MOUTH AT BEDTIME 12/30/16   Jilda Roche Wendling, DO   promethazine (PHENERGAN) 25 MG tablet Take 1 tablet (25 mg total) by mouth every 8 (eight) hours as needed for nausea or vomiting. 10/22/16   Vivi Barrack, DPM  ranitidine (ZANTAC) 150 MG capsule Take 300 mg by mouth 2 (two) times daily.     Historical Provider, MD  sucralfate (CARAFATE) 1 g tablet Take 1 tablet (1 g total) by mouth 4 (four) times daily -  with meals and at bedtime. 03/10/17   Trixie Dredge, PA-C  tiZANidine (ZANAFLEX) 4 MG tablet Take 4 mg by mouth every 6 (six) hours as needed for muscle spasms.  11/18/16   Historical Provider, MD  traMADol (ULTRAM) 50 MG tablet Take 1 tablet (50 mg total)  by mouth every 8 (eight) hours as needed. 02/26/17   Vivi Barrack, DPM  venlafaxine XR (EFFEXOR-XR) 150 MG 24 hr capsule Take 2 capsules (300 mg total) by mouth daily before breakfast. 11/06/16   Nelwyn Salisbury, MD    Family History Family History  Problem Relation Age of Onset  . Brain cancer Father   . Anesthesia problems Father     hard to wake up post-op  . Lung cancer Father   . Breast cancer Maternal Grandmother   . Ovarian cancer Maternal Grandmother   . Diabetes Maternal Grandmother   . Heart disease Maternal Grandmother   . Irritable bowel syndrome Mother   . Prostate cancer Maternal Grandfather   . Melanoma Maternal Grandfather     Social History Social History  Substance Use Topics  . Smoking status: Current Every Day Smoker    Packs/day: 0.50    Years: 15.00    Types: Cigarettes  . Smokeless tobacco: Never Used     Comment: 2 cig./day  . Alcohol use 0.0 oz/week     Comment: occasionally - glass wine     Allergies   Nsaids; Nickel; Adhesive [tape]; Metoclopramide hcl; and Prochlorperazine edisylate   Review of Systems Review of Systems  All other systems reviewed and are negative.    Physical Exam Updated Vital Signs BP 116/74   Pulse 83   Temp 98.4 F (36.9 C) (Oral)   Resp 16   Ht  (1.702 m)   Wt 101.2 kg   LMP 02/24/2017   SpO2 96%    BMI 34.93 kg/m   Physical Exam  Constitutional: She appears well-developed and well-nourished. No distress.  HENT:  Head: Normocephalic and atraumatic.  Neck: Neck supple.  Cardiovascular: Normal rate and regular rhythm.   Pulmonary/Chest: Effort normal and breath sounds normal. No respiratory distress. She has no wheezes. She has no rales.  Abdominal: Soft. She exhibits no distension. There is tenderness (upper abdomen ). There is no rebound and no guarding.  Musculoskeletal:       Back:  Neurological: She is alert.  Skin: She is not diaphoretic.  Nursing note and vitals reviewed.    ED Treatments / Results  Labs (all labs ordered are listed, but only abnormal results are displayed) Labs Reviewed  COMPREHENSIVE METABOLIC PANEL - Abnormal; Notable for the following:       Result Value   Chloride 100 (*)    Total Bilirubin 0.2 (*)    All other components within normal limits  CBC WITH DIFFERENTIAL/PLATELET - Abnormal; Notable for the following:    WBC 11.5 (*)    Neutro Abs 7.9 (*)    All other components within normal limits  URINALYSIS, ROUTINE W REFLEX MICROSCOPIC  PREGNANCY, URINE  LIPASE, BLOOD    EKG  EKG Interpretation None       Radiology Dg Chest 2 View  Result Date: 03/10/2017 CLINICAL DATA:  Cough for 1 month EXAM: CHEST  2 VIEW COMPARISON:  08/22/2016 chest radiograph. FINDINGS: Stable cardiomediastinal silhouette with top-normal heart size. No pneumothorax. No pleural effusion. Lungs appear clear, with no acute consolidative airspace disease and no pulmonary edema. Cholecystectomy clips are seen in the right upper quadrant of the abdomen. IMPRESSION: No active cardiopulmonary disease. Electronically Signed   By: Delbert Phenix M.D.   On: 03/10/2017 14:39   Ct Abdomen Pelvis W Contrast  Result Date: 03/10/2017 CLINICAL DATA:  Upper abdominal pain. History of nephrolithiasis, appendectomy and cholecystectomy. EXAM: CT ABDOMEN AND  PELVIS WITH CONTRAST  TECHNIQUE: Multidetector CT imaging of the abdomen and pelvis was performed using the standard protocol following bolus administration of intravenous contrast. CONTRAST:  ISOVUE-300 IOPAMIDOL (ISOVUE-300) INJECTION 61% COMPARISON:  06/11/2014 unenhanced CT abdomen/ pelvis. 12/02/2016 right upper quadrant abdominal sonogram. FINDINGS: Lower chest: No significant pulmonary nodules or acute consolidative airspace disease. Hepatobiliary: Diffuse hepatic steatosis. No liver mass. No definite liver surface irregularity. Cholecystectomy. No biliary ductal dilatation. Pancreas: Normal, with no mass or duct dilation. Spleen: Normal size. No mass. Adrenals/Urinary Tract: Normal adrenals. Normal kidneys with no hydronephrosis and no renal mass. No appreciable nephrolithiasis on this contrast-enhanced scan. Normal bladder. Stomach/Bowel: Grossly normal stomach. Normal caliber small bowel with no small bowel wall thickening. Appendectomy. Normal large bowel with no diverticulosis, large bowel wall thickening or pericolonic fat stranding. Vascular/Lymphatic: Normal caliber abdominal aorta. Patent portal, splenic and renal veins. No pathologically enlarged lymph nodes in the abdomen or pelvis. Reproductive: Grossly normal uterus.  No adnexal mass. Other: No pneumoperitoneum, ascites or focal fluid collection. Musculoskeletal: No aggressive appearing focal osseous lesions. IMPRESSION: No acute abnormality. No evidence of bowel obstruction or acute bowel inflammation. No hydronephrosis. Diffuse hepatic steatosis. Electronically Signed   By: Delbert Phenix M.D.   On: 03/10/2017 16:26    Procedures Procedures (including critical care time)  Medications Ordered in ED Medications  sodium chloride 0.9 % bolus 1,000 mL (0 mLs Intravenous Stopped 03/10/17 1605)  morphine 4 MG/ML injection 4 mg (4 mg Intravenous Given 03/10/17 1449)  ondansetron (ZOFRAN) injection 4 mg (4 mg Intravenous Given 03/10/17 1449)  pantoprazole  (PROTONIX) injection 40 mg (40 mg Intravenous Given 03/10/17 1449)  gi cocktail (Maalox,Lidocaine,Donnatal) (30 mLs Oral Given 03/10/17 1449)  HYDROmorphone (DILAUDID) injection 1 mg (1 mg Intravenous Given 03/10/17 1527)  HYDROmorphone (DILAUDID) injection 1 mg (1 mg Intravenous Given 03/10/17 1603)  iopamidol (ISOVUE-300) 61 % injection 100 mL (100 mLs Intravenous Contrast Given 03/10/17 1608)  sucralfate (CARAFATE) tablet 1 g (1 g Oral Given 03/10/17 1712)     Initial Impression / Assessment and Plan / ED Course  I have reviewed the triage vital signs and the nursing notes.  Pertinent labs & imaging results that were available during my care of the patient were reviewed by me and considered in my medical decision making (see chart for details).     Afebrile, nontoxic patient with upper abdominal pain.   Had been taking NSAIDs for foot pain despite having hx PUD.  Labs, UA reassuring.  CT negative.  CXR negative.  Doubt CAD, PE, PNA, pancreatitis, postoperative complications.  Suspect possible GERD/gastritis/PUD.  Exam is nonsurgical.  Pt requesting narcotics for home though patient is on chronic narcotics from other providers - of note, per [facility] database pt has been steadily decreased from Percocet 10s last Autumn to very recently Tylenol #3.  D/C home with carafate, PCP, GI follow up.  Discussed result, findings, treatment, and follow up  with patient.  Pt given return precautions.  Pt verbalizes understanding and agrees with plan.       Final Clinical Impressions(s) / ED Diagnoses   Final diagnoses:  Epigastric pain    New Prescriptions Discharge Medication List as of 03/10/2017  5:09 PM    START taking these medications   Details  sucralfate (CARAFATE) 1 g tablet Take 1 tablet (1 g total) by mouth 4 (four) times daily -  with meals and at bedtime., Starting Tue 03/10/2017, Print         [place], PA-C  03/10/17 1743    Trixie Dredge, PA-C 03/10/17 1745    Rolland Porter,  MD 03/21/17 2351

## 2017-03-11 ENCOUNTER — Telehealth: Payer: Self-pay | Admitting: Gastroenterology

## 2017-03-11 MED ORDER — TRAMADOL HCL 50 MG PO TABS: 50.0000 mg | ORAL_TABLET | Freq: Three times a day (TID) | ORAL | 0 refills | Status: DC | PRN

## 2017-03-11 NOTE — Telephone Encounter (Signed)
Patient was in the ED for epigastric pain.  She will come in and see Mike Gip PA on 03/16/17 3:00. She wants to discuss an EGD.

## 2017-03-16 ENCOUNTER — Encounter: Payer: Self-pay | Admitting: Physician Assistant

## 2017-03-16 ENCOUNTER — Ambulatory Visit (INDEPENDENT_AMBULATORY_CARE_PROVIDER_SITE_OTHER): Payer: BLUE CROSS/BLUE SHIELD | Admitting: Physician Assistant

## 2017-03-16 ENCOUNTER — Other Ambulatory Visit (INDEPENDENT_AMBULATORY_CARE_PROVIDER_SITE_OTHER): Payer: BLUE CROSS/BLUE SHIELD

## 2017-03-16 VITALS — BP 108/70 | HR 84 | Ht 67.0 in | Wt 226.0 lb

## 2017-03-16 DIAGNOSIS — K2900 Acute gastritis without bleeding: Secondary | ICD-10-CM | POA: Diagnosis not present

## 2017-03-16 DIAGNOSIS — R1013 Epigastric pain: Secondary | ICD-10-CM | POA: Diagnosis not present

## 2017-03-16 DIAGNOSIS — K279 Peptic ulcer, site unspecified, unspecified as acute or chronic, without hemorrhage or perforation: Secondary | ICD-10-CM | POA: Diagnosis not present

## 2017-03-16 LAB — CBC WITH DIFFERENTIAL/PLATELET
BASOS ABS: 0.1 10*3/uL (ref 0.0–0.1)
Basophils Relative: 0.6 % (ref 0.0–3.0)
EOS ABS: 0.5 10*3/uL (ref 0.0–0.7)
Eosinophils Relative: 4.3 % (ref 0.0–5.0)
HCT: 36.8 % (ref 36.0–46.0)
Hemoglobin: 12.5 g/dL (ref 12.0–15.0)
LYMPHS ABS: 2.5 10*3/uL (ref 0.7–4.0)
Lymphocytes Relative: 23.5 % (ref 12.0–46.0)
MCHC: 33.8 g/dL (ref 30.0–36.0)
MCV: 86.8 fl (ref 78.0–100.0)
Monocytes Absolute: 0.7 10*3/uL (ref 0.1–1.0)
Monocytes Relative: 6.6 % (ref 3.0–12.0)
NEUTROS PCT: 65 % (ref 43.0–77.0)
Neutro Abs: 6.9 10*3/uL (ref 1.4–7.7)
Platelets: 301 10*3/uL (ref 150.0–400.0)
RBC: 4.24 Mil/uL (ref 3.87–5.11)
RDW: 13.1 % (ref 11.5–15.5)
WBC: 10.6 10*3/uL — ABNORMAL HIGH (ref 4.0–10.5)

## 2017-03-16 MED ORDER — RABEPRAZOLE SODIUM 20 MG PO TBEC: 20.0000 mg | DELAYED_RELEASE_TABLET | Freq: Two times a day (BID) | ORAL | 3 refills | Status: DC

## 2017-03-16 MED ORDER — HYDROCODONE-ACETAMINOPHEN 5-325 MG PO TABS: 1.0000 | ORAL_TABLET | Freq: Four times a day (QID) | ORAL | 0 refills | Status: DC | PRN

## 2017-03-16 NOTE — Progress Notes (Signed)
Reviewed and agree with initial management plan.  Shivan Hodes T. Fay Bagg, MD FACG 

## 2017-03-16 NOTE — Progress Notes (Signed)
Subjective:    Patient ID: Diane [name], female    DOB: [birth year], 36 y.o.   MRN: [id number]  HPI Diane [name] is a pleasant 36 year old white female, known to Dr. Russella [name] who comes in today after an ER visit on 03/10/2017 with acute epigastric pain. Patient has history of cholecystitis and is status post lap cholecystectomy in January 2018, also with anxiety, depression and chronic foot and ankle pain. She has had 3 surgeries on her ankle over the past couple of years and has ongoing She underwent EGD and colonoscopy in February 2017. Colonoscopy was normal. EGD showed a single shallow prepyloric ulcer and moderate erosive gastritis. Biopsies were negative for H. pylori. Patient was taking NSAIDs at that time. She says that she has been off and on NSAIDs again and had been taking 3-4 ibuprofen per day over the past few months for ankle pain as she has not had any other pain medication she started having recurrent epigastric pain despite taking over-the-counter Zantac regularly and presented to the ER on 424 with worsening pain that she described as sharp and stabbing associated with nausea and rating around into her left mid back. CT of the abdomen and pelvis was done showing hepatic steatosis, status post cholecystectomy and otherwise negative exam. Labs were unremarkable with the exception of a WBC of 11.5, hemoglobin was 13.  Review of Systems Pertinent positive and negative review of systems were noted in the above HPI section.  All other review of systems was otherwise negative.  Outpatient Encounter Prescriptions as of 03/16/2017  Medication Sig  . acetaminophen-codeine (TYLENOL #3) 300-30 MG tablet Take 1-2 tablets by mouth every 6 (six) hours as needed for moderate pain.  . cetirizine (ZYRTEC) 10 MG tablet Take 10 mg by mouth daily.  . diazepam (VALIUM) 5 MG tablet Take 1 tablet (5 mg total) by mouth every 12 (twelve) hours as needed for anxiety.  . gabapentin (NEURONTIN) 300 MG capsule Take 2  capsules (600 mg total) by mouth 3 (three) times daily. (Patient taking differently: Take 600-900 mg by mouth 2 (two) times daily. Take 2 capsules (600 mg) in the morning and Take 3 capsules (900 mg) in the evening.)  . Magnesium 400 MG TABS Take 1,200 mg by mouth daily.   Marland Kitchen oxybutynin (DITROPAN-XL) 10 MG 24 hr tablet TAKE ONE TABLET BY MOUTH AT BEDTIME  . ranitidine (ZANTAC) 150 MG capsule Take 300 mg by mouth 2 (two) times daily.   . sucralfate (CARAFATE) 1 g tablet Take 1 tablet (1 g total) by mouth 4 (four) times daily -  with meals and at bedtime.  Marland Kitchen tiZANidine (ZANAFLEX) 4 MG tablet Take 4 mg by mouth every 6 (six) hours as needed for muscle spasms.   . traMADol (ULTRAM) 50 MG tablet Take 1 tablet (50 mg total) by mouth every 8 (eight) hours as needed.  . venlafaxine XR (EFFEXOR-XR) 150 MG 24 hr capsule Take 2 capsules (300 mg total) by mouth daily before breakfast.  . HYDROcodone-acetaminophen (NORCO/VICODIN) 5-325 MG tablet Take 1 tablet by mouth every 6 (six) hours as needed for moderate pain.  . RABEprazole (ACIPHEX) 20 MG tablet Take 1 tablet (20 mg total) by mouth 2 (two) times daily.  . [DISCONTINUED] dicyclomine (BENTYL) 10 MG capsule TAKE ONE CAPSULE BY MOUTH 3 TIMES A DAY WITH MEALS  . [DISCONTINUED] naratriptan (AMERGE) 1 MG TABS tablet Take 1 tab at onset of headache; if returns or does not resolve, may repeat after 1 hours. Do not  exceed more than this in 24 hrs.  . [DISCONTINUED] NONFORMULARY OR COMPOUNDED ITEM [facility]:  Peripheral Neuropathy Cream - Bupivacaine 1%, Doxepin 3%, Gabapentin 6%, Pentoxifylline 3%, Topiramate 1%, + Diclofenac 3%, apply 1-2 grams to affected area 3-4 times daily.  . [DISCONTINUED] promethazine (PHENERGAN) 25 MG tablet Take 1 tablet (25 mg total) by mouth every 8 (eight) hours as needed for nausea or vomiting.  . [DISCONTINUED] traMADol (ULTRAM) 50 MG tablet Take 1 tablet (50 mg total) by mouth every 8 (eight) hours as needed.    Facility-Administered Encounter Medications as of 03/16/2017  Medication  . triamcinolone acetonide (KENALOG) 10 MG/ML injection 10 mg   Allergies  Allergen Reactions  . Nsaids Other (See Comments)    DUE TO HISTORY OF GASTRIC ULCER  . Nickel     "any jewelry that's not real"   . Adhesive [Tape] Rash  . Metoclopramide Hcl Other (See Comments)    RESTLESSNESS/JITTERY  . Prochlorperazine Edisylate Other (See Comments)    RESTLESSNESS/JITTERY    Patient Active Problem List   Diagnosis Date Noted  . Nausea and vomiting 12/03/2016  . RUQ pain   . Cholelithiasis and acute cholecystitis without obstruction 12/02/2016  . Ankle fracture 01/05/2016  . Chronic pain of right knee 04/12/2015  . Gastric ulcer 03/16/2015  . Chronic cough 12/27/2013  . Pleural effusion 12/15/2013  . DEPRESSION, ACUTE 10/02/2008  . NARCOTIC ABUSE 10/02/2008  . BENIGN INTRACRANIAL HYPERTENSION 06/16/2008  . BACK PAIN 03/28/2008  . WEIGHT GAIN 01/25/2008  . ANXIETY STATE, UNSPECIFIED 11/09/2007  . HEADACHE 07/09/2007   Social History   Social History  . Marital status: Married    Spouse name: N/A  . Number of children: 2  . Years of education: N/A   Occupational History  . 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 Smoking status: Current Every Day Smoker    Packs/day: 0.50    Years: 15.00    Types: Cigarettes  . Smokeless tobacco: Never Used     Comment: 2 cig./day  . Alcohol use 0.0 oz/week     Comment: occasionally - glass wine  . Drug use: No  . Sexual activity: Not Currently   Other Topics Concern  . Not on file   Social History Narrative  . No narrative on file    [name]'s family history includes Anesthesia problems in her father; Brain cancer in her father; Breast cancer in her maternal grandmother; Diabetes in her maternal grandmother; Heart disease in her maternal grandmother; Irritable bowel syndrome in her mother; Lung cancer in her father;  Melanoma in her maternal grandfather; Ovarian cancer in her maternal grandmother; Prostate cancer in her maternal grandfather.      Objective:    Vitals:   03/16/17 1540  BP: 108/70  Pulse: 84    Physical Exam  well-developed young white female in no acute distress, accompanied by her 74-year-old daughter, blood pressure 108/70 pulse 84, height 5 foot 7, weight 226, BMI 35.4. HEENT; nontraumatic normocephalic EOMI PERRLA sclera anicteric, Cardiovascular; regular rate and rhythm with S1-S2 no murmur or gallop, Pulmonary ;clear bilaterally, Abdomen; obese soft tender across the epigastrium, nondistended, no palpable mass or hepatosplenomegaly bowel sounds are present, Rectal ;exam not done, Extremities ;no clubbing cyanosis or edema skin warm and dry, Neuropsych; mood and affect appropriate       Assessment & Plan:   #53 36 year old white female with previous history of NSAID induced prepyloric ulcer and  gastritis February 2017 with recurrent epigastric pain and nausea. Patient has been requiring NSAIDs on a fairly regular basis for ongoing ankle pain. Suspect recurrent gastritis or peptic ulcer disease. #2 status post cholecystectomy January 2018 #3 hepatic steatosis #4 obesity #5 chronic ankle pain post fracture #6 history of iron deficiency-on chronic iron, history of endometriosis and menorrhagia   Plan; start AcipHex 20 mg by mouth twice daily in place of Zantac She will finish the prescription of Carafate 1 g by mouth between meals and at bedtime she was given in the ER. Long discussion regarding her chronic NSAID use and need to decrease and stop NSAID use as much as possible. Suggested she try alternating with Tylenol. She has had all TRAM previously which is not helpful. I will give her a short-term prescription for Vicodin 5/325 one by mouth every 6 hours when necessary for pain #40 and no refills. She was told this would not be a recurring prescription. I would like her to have a  period of time off of NSAIDs to allow her stomach to heal. Check CBC today Patient is asked to call back in a couple of weeks, she's not having any significant improvement of symptoms she may need repeat EGD.   Hosey Burmester S Amelita Risinger PA-C 03/16/2017   Cc: No ref. provider found

## 2017-03-16 NOTE — Patient Instructions (Signed)
Your physician has requested that you go to the basement for the following lab work before leaving today: CBC  We have sent the following medications to your pharmacy for you to pick up at your convenience:   Vicoden 5/325 mg every 6 hours as needed for pain  Aciphex 20 mg twice a day x 1 month then every morning.

## 2017-03-21 DIAGNOSIS — G4733 Obstructive sleep apnea (adult) (pediatric): Secondary | ICD-10-CM | POA: Diagnosis not present

## 2017-03-24 ENCOUNTER — Telehealth: Payer: Self-pay | Admitting: Pulmonary Disease

## 2017-03-24 ENCOUNTER — Other Ambulatory Visit: Payer: Self-pay | Admitting: *Deleted

## 2017-03-24 DIAGNOSIS — G4733 Obstructive sleep apnea (adult) (pediatric): Secondary | ICD-10-CM

## 2017-03-24 DIAGNOSIS — R0683 Snoring: Secondary | ICD-10-CM

## 2017-03-24 NOTE — Telephone Encounter (Signed)
HST 03/21/17 >> AHI 8.1, SaO2 low 84%  Will have my nurse inform pt that sleep study shows mild sleep apnea.  Options are 1) CPAP now, 2) ROV first.  If She is agreeable to CPAP, then please send order for auto CPAP range 5 to 15 cm H2O with heated humidity and mask of choice.  Have download sent 1 month after starting CPAP and set up ROV 2 months after starting CPAP.  ROV can be with me or NP.

## 2017-03-26 ENCOUNTER — Other Ambulatory Visit: Payer: Self-pay

## 2017-03-26 ENCOUNTER — Telehealth: Payer: Self-pay | Admitting: Gastroenterology

## 2017-03-26 ENCOUNTER — Telehealth: Payer: Self-pay | Admitting: Physician Assistant

## 2017-03-26 MED ORDER — SUCRALFATE 1 G PO TABS: 1.0000 g | ORAL_TABLET | Freq: Three times a day (TID) | ORAL | 0 refills | Status: DC

## 2017-03-26 NOTE — Telephone Encounter (Signed)
No more hydrocodon form us- she is using it for ankle pain - needs to get further pain med from PCP or ortho. Continue to try to avoid NSAIDS, continue Carafate 4 x daily  and  BID Aciphex. For another 3-4 weeks . We can schedule for EGD if she desires with Dr Russella DarStark, but may not change plan. Can refill Carafate and Aciphex

## 2017-03-26 NOTE — Telephone Encounter (Signed)
  [name], This patient called tonight, she spoke with you earlier today. She reports ongoing epigastric discomfort and some loose stools, nausea. Stools are dark but she's been taking carafate. Denies NSAID use in recent days but had been taking it previously. On aciphex 20mg  BID. Last CBC 10 days ago showed normal Hgb.   Sounds like she needs an EGD, in process of scheduling, she is asking for it ASAP, not sure when is next available with Dr. Russella DarStark. I think repeat labs tomorrow may be reasonable, I don't think she is bleeding, but difficult to assess for this with carafate on board. Not sure if you want to add stool testing given ongoing diarrhea as well. She's asking for a call back tomorrow AM.  I counseled her with inability to tolerate PO, worsening abdominal pain than she's had / unable to tolerate it, symptoms of bleeding otherwise, to go to the hospital, she doesn't think she will need to do that at present, symptoms ongoing for a few weeks. Recent CT without any focal pathology to cause symptoms.  [name] FYI

## 2017-03-26 NOTE — Telephone Encounter (Signed)
Yes , please go ahead and schedule

## 2017-03-26 NOTE — Telephone Encounter (Signed)
Patient states she continues to have abdominal pain. She complains of belching, burping and reflux. She is taking Alka Seltzer Chews at least twice daily. She is having up to 6 frequent loose bm's each day. She denies taking any OTC pain meds even though her ankle is "killing" her. She says she has 7 hydrocodone left. Please advise.

## 2017-03-26 NOTE — Telephone Encounter (Signed)
Discussed with the patient. She expresses understanding of the plan. She is willing to go forward with EGD. She will continue the Aciphex and carafate. Can she schedule an EGD at this point?

## 2017-03-27 ENCOUNTER — Other Ambulatory Visit (INDEPENDENT_AMBULATORY_CARE_PROVIDER_SITE_OTHER): Payer: BLUE CROSS/BLUE SHIELD

## 2017-03-27 ENCOUNTER — Other Ambulatory Visit: Payer: Self-pay

## 2017-03-27 DIAGNOSIS — R1013 Epigastric pain: Secondary | ICD-10-CM

## 2017-03-27 DIAGNOSIS — R197 Diarrhea, unspecified: Secondary | ICD-10-CM | POA: Diagnosis not present

## 2017-03-27 LAB — CBC WITH DIFFERENTIAL/PLATELET
Basophils Absolute: 0 10*3/uL (ref 0.0–0.1)
Basophils Relative: 0.6 % (ref 0.0–3.0)
EOS ABS: 0.5 10*3/uL (ref 0.0–0.7)
Eosinophils Relative: 6.6 % — ABNORMAL HIGH (ref 0.0–5.0)
HEMATOCRIT: 38.5 % (ref 36.0–46.0)
Hemoglobin: 13 g/dL (ref 12.0–15.0)
LYMPHS PCT: 30.6 % (ref 12.0–46.0)
Lymphs Abs: 2.2 10*3/uL (ref 0.7–4.0)
MCHC: 33.7 g/dL (ref 30.0–36.0)
MCV: 86.2 fl (ref 78.0–100.0)
MONO ABS: 0.4 10*3/uL (ref 0.1–1.0)
Monocytes Relative: 6.2 % (ref 3.0–12.0)
NEUTROS ABS: 4 10*3/uL (ref 1.4–7.7)
Neutrophils Relative %: 56 % (ref 43.0–77.0)
PLATELETS: 296 10*3/uL (ref 150.0–400.0)
RBC: 4.47 Mil/uL (ref 3.87–5.11)
RDW: 13.3 % (ref 11.5–15.5)
WBC: 7.2 10*3/uL (ref 4.0–10.5)

## 2017-03-27 LAB — COMPREHENSIVE METABOLIC PANEL
ALT: 50 U/L — ABNORMAL HIGH (ref 0–35)
AST: 30 U/L (ref 0–37)
Albumin: 4.2 g/dL (ref 3.5–5.2)
Alkaline Phosphatase: 131 U/L — ABNORMAL HIGH (ref 39–117)
BUN: 11 mg/dL (ref 6–23)
CALCIUM: 9.7 mg/dL (ref 8.4–10.5)
CHLORIDE: 103 meq/L (ref 96–112)
CO2: 25 meq/L (ref 19–32)
CREATININE: 0.68 mg/dL (ref 0.40–1.20)
GFR: 104.9 mL/min (ref >60.00)
Glucose, Bld: 110 mg/dL — ABNORMAL HIGH (ref 70–99)
POTASSIUM: 3.9 meq/L (ref 3.5–5.1)
Sodium: 136 mEq/L (ref 135–145)
Total Bilirubin: 0.3 mg/dL (ref 0.2–1.2)
Total Protein: 7.7 g/dL (ref 6.0–8.3)

## 2017-03-27 MED ORDER — HYDROCODONE-ACETAMINOPHEN 5-325 MG PO TABS: ORAL_TABLET | ORAL | 0 refills | Status: DC

## 2017-03-27 NOTE — Telephone Encounter (Signed)
Will let AE decide since she recently evaluated the patient.

## 2017-03-27 NOTE — Telephone Encounter (Signed)
I spoke with the patient. She will come today for CBC. She states 6 diarrhea stools this morning. Do you want GI pathogen panel? EGD 04/17/17 first available.

## 2017-03-27 NOTE — Telephone Encounter (Signed)
Ok..lets repeat CBC with diff, do CMET and GI path panel. Schedule EGD with Dr Russella DarStark as soon as available. She should be on Aciphex BID  . Continue Carafate 4 x daily , may give her Zofran or phenergan to use prn

## 2017-03-27 NOTE — Telephone Encounter (Signed)
Patient notified and agrees to this plan. 

## 2017-03-27 NOTE — Telephone Encounter (Signed)
We will do vicodin 5/325 1 tab po q4-6 hours prn pain. However she does need to see pain management.

## 2017-03-30 ENCOUNTER — Ambulatory Visit: Payer: BLUE CROSS/BLUE SHIELD

## 2017-03-30 ENCOUNTER — Other Ambulatory Visit: Payer: BLUE CROSS/BLUE SHIELD

## 2017-03-30 VITALS — Ht 67.0 in | Wt 224.0 lb

## 2017-03-30 DIAGNOSIS — R1310 Dysphagia, unspecified: Secondary | ICD-10-CM

## 2017-03-30 DIAGNOSIS — G8929 Other chronic pain: Secondary | ICD-10-CM

## 2017-03-30 DIAGNOSIS — R1319 Other dysphagia: Secondary | ICD-10-CM

## 2017-03-30 DIAGNOSIS — R197 Diarrhea, unspecified: Secondary | ICD-10-CM

## 2017-03-30 DIAGNOSIS — R1011 Right upper quadrant pain: Principal | ICD-10-CM

## 2017-03-30 DIAGNOSIS — R1013 Epigastric pain: Secondary | ICD-10-CM

## 2017-03-30 NOTE — Progress Notes (Signed)
Per pt, no allergies to soy or egg products.Pt not taking any weight loss meds or using  O2 at home.   Pt refused Emmi video. 

## 2017-04-01 NOTE — Telephone Encounter (Signed)
LM x 1 

## 2017-04-02 LAB — GASTROINTESTINAL PATHOGEN PANEL PCR
C. difficile Tox A/B, PCR: NOT DETECTED
CAMPYLOBACTER, PCR: NOT DETECTED
Cryptosporidium, PCR: NOT DETECTED
E coli (ETEC) LT/ST PCR: NOT DETECTED
E coli (STEC) stx1/stx2, PCR: NOT DETECTED
E coli 0157, PCR: NOT DETECTED
Giardia lamblia, PCR: NOT DETECTED
NOROVIRUS, PCR: NOT DETECTED
ROTAVIRUS, PCR: NOT DETECTED
SALMONELLA, PCR: NOT DETECTED
SHIGELLA, PCR: NOT DETECTED

## 2017-04-03 ENCOUNTER — Ambulatory Visit (INDEPENDENT_AMBULATORY_CARE_PROVIDER_SITE_OTHER): Payer: BLUE CROSS/BLUE SHIELD | Admitting: Physician Assistant

## 2017-04-03 ENCOUNTER — Telehealth: Payer: Self-pay | Admitting: Podiatry

## 2017-04-03 ENCOUNTER — Encounter: Payer: Self-pay | Admitting: Physician Assistant

## 2017-04-03 VITALS — BP 112/80 | HR 103 | Temp 99.0°F | Resp 16 | Ht 67.0 in | Wt 222.0 lb

## 2017-04-03 DIAGNOSIS — J209 Acute bronchitis, unspecified: Secondary | ICD-10-CM

## 2017-04-03 LAB — POCT INFLUENZA A/B
Influenza A, POC: NEGATIVE
Influenza B, POC: NEGATIVE

## 2017-04-03 MED ORDER — HYDROCODONE-ACETAMINOPHEN 5-325 MG PO TABS: ORAL_TABLET | ORAL | 0 refills | Status: DC

## 2017-04-03 MED ORDER — ALBUTEROL SULFATE (2.5 MG/3ML) 0.083% IN NEBU
2.5000 mg | INHALATION_SOLUTION | Freq: Once | RESPIRATORY_TRACT | Status: AC
  Administered 2017-04-03: 2.5 mg via RESPIRATORY_TRACT

## 2017-04-03 MED ORDER — HYDROCOD POLST-CPM POLST ER 10-8 MG/5ML PO SUER: 5.0000 mL | Freq: Two times a day (BID) | ORAL | 0 refills | Status: DC | PRN

## 2017-04-03 MED ORDER — DOXYCYCLINE HYCLATE 100 MG PO CAPS: 100.0000 mg | ORAL_CAPSULE | Freq: Two times a day (BID) | ORAL | 0 refills | Status: DC

## 2017-04-03 MED ORDER — AZITHROMYCIN 250 MG PO TABS: ORAL_TABLET | ORAL | 0 refills | Status: DC

## 2017-04-03 NOTE — Telephone Encounter (Signed)
OK to refill on Monday

## 2017-04-03 NOTE — Addendum Note (Signed)
Addended by: Con MemosMOORE, Jadalee Westcott S on: 04/03/2017 09:22 AM   Modules accepted: Orders

## 2017-04-03 NOTE — Patient Instructions (Signed)
Please stay hydrated and get plenty of rest.  Use your albuterol inhaler every 6 hours as needed for wheeze. Use cough medication as directed. Start a plain Mucinex to thin congestion.  Follow-up if symptoms are not improving.

## 2017-04-03 NOTE — Progress Notes (Signed)
Pre visit review using our clinic review tool, if applicable. No additional management support is needed unless otherwise documented below in the visit note. 

## 2017-04-03 NOTE — Telephone Encounter (Signed)
Patient called saying she spoke to you earlier about getting into pain management. She is scheduled for an endoscopy on Friday June 01 and will be out of pain medication before she can get into pain management. States she only has 8 pills left. Wants to know if she can get a refill. Stated she can come to the office Monday to pick up the prescription.

## 2017-04-03 NOTE — Progress Notes (Signed)
Patient presents to clinic today c/o cough and chest congestion. Cough was dry but is now starting to be productive of clear phlegm. Endorses nasal congestion, rhinorrhea and sinus pressure. Denies ear pain. Endorses subjective fevers at home with chills. Did not check temperature. Husband and children have had similar symptoms.  Has taken Dayquil with only some relief of symptoms..   Past Medical History:  Diagnosis Date  . ADD (attention deficit disorder with hyperactivity)    no current med.  . Anemia    takes iron supplement  . Anxiety   . Bronchitis    uses an inhaler  . Choking    due to food and pills get stuck  . Cough 10/16/2016  . Depression   . Family history of adverse reaction to anesthesia    pt's father has hx. of being hard to wake up post-op  . GERD (gastroesophageal reflux disease)   . History of gastric ulcer   . History of kidney stones   . IBS (irritable bowel syndrome)   . Migraines   . Osteoarthritis    right ankle  . Osteochondral lesion 09/2016   right ankle  . Plantar fasciitis of left foot   . PONV (postoperative nausea and vomiting)   . Pseudotumor cerebri    at back head  . Wears partial dentures    upper    Current Outpatient Prescriptions on File Prior to Visit  Medication Sig Dispense Refill  . albuterol (PROVENTIL HFA;VENTOLIN HFA) 108 (90 Base) MCG/ACT inhaler Inhale into the lungs as needed for wheezing or shortness of breath.    . cetirizine (ZYRTEC) 10 MG tablet Take 10 mg by mouth daily.    . diazepam (VALIUM) 5 MG tablet Take 1 tablet (5 mg total) by mouth every 12 (twelve) hours as needed for anxiety. 60 tablet 5  . gabapentin (NEURONTIN) 300 MG capsule Take 2 capsules (600 mg total) by mouth 3 (three) times daily. (Patient taking differently: Take 600-900 mg by mouth 2 (two) times daily. Take 2 capsules (600 mg) in the morning and Take 4 capsules (900 mg) in the evening.) 180 capsule 5  . HYDROcodone-acetaminophen (NORCO/VICODIN)  5-325 MG tablet Take one tablet every 4-6 hours prn foot pain. 30 tablet 0  . Magnesium 400 MG TABS Take 1,200 mg by mouth daily.     Marland Kitchen oxybutynin (DITROPAN-XL) 10 MG 24 hr tablet TAKE ONE TABLET BY MOUTH AT BEDTIME 30 tablet 5  . RABEprazole (ACIPHEX) 20 MG tablet Take 1 tablet (20 mg total) by mouth 2 (two) times daily. 60 tablet 3  . sucralfate (CARAFATE) 1 g tablet Take 1 tablet (1 g total) by mouth 4 (four) times daily -  with meals and at bedtime. 120 tablet 0  . tiZANidine (ZANAFLEX) 4 MG tablet Take 4 mg by mouth every 6 (six) hours as needed for muscle spasms.     Marland Kitchen venlafaxine XR (EFFEXOR-XR) 150 MG 24 hr capsule Take 2 capsules (300 mg total) by mouth daily before breakfast. 60 capsule 11   Current Facility-Administered Medications on File Prior to Visit  Medication Dose Route Frequency Provider Last Rate Last Dose  . triamcinolone acetonide (KENALOG) 10 MG/ML injection 10 mg  10 mg Other Once Landis Martins, DPM        Allergies  Allergen Reactions  . Nickel     "any jewelry that's not real"/ rash and itching   . Nsaids Other (See Comments)    DUE TO HISTORY OF GASTRIC ULCER  .  Adhesive [Tape] Rash  . Metoclopramide Hcl Other (See Comments)    RESTLESSNESS/JITTERY  . Prochlorperazine Edisylate Other (See Comments)    RESTLESSNESS/JITTERY    Family History  Problem Relation Age of Onset  . Brain cancer Father   . Anesthesia problems Father        hard to wake up post-op  . Lung cancer Father   . Breast cancer Maternal Grandmother   . Ovarian cancer Maternal Grandmother   . Diabetes Maternal Grandmother   . Heart disease Maternal Grandmother   . Irritable bowel syndrome Mother   . Prostate cancer Maternal Grandfather   . Melanoma Maternal Grandfather   . Colon cancer Neg Hx     Social History   Social History  . Marital status: Married    Spouse name: N/A  . Number of children: 2  . Years of education: N/A   Occupational History  . 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 Smoking status: Current Some Day Smoker    Packs/day: 0.50    Years: 15.00    Types: Cigarettes  . Smokeless tobacco: Never Used  . Alcohol use 0.0 oz/week     Comment: occasionally - glass wine  . Drug use: No  . Sexual activity: Not Currently   Other Topics Concern  . None   Social History Narrative  . None   Review of Systems - See HPI.  All other ROS are negative.  BP 112/80   Pulse (!) 103   Temp 99 F (37.2 C) (Oral)   Resp 16   Ht '5\' 7"'  (1.702 m)   Wt 222 lb (100.7 kg)   LMP 03/22/2017   SpO2 98%   BMI 34.77 kg/m   Physical Exam  Constitutional: She is oriented to person, place, and time and well-developed, well-nourished, and in no distress.  HENT:  Head: Normocephalic and atraumatic.  Right Ear: Tympanic membrane normal.  Left Ear: Tympanic membrane normal.  Nose: Rhinorrhea present. Right sinus exhibits no maxillary sinus tenderness and no frontal sinus tenderness. Left sinus exhibits no maxillary sinus tenderness and no frontal sinus tenderness.  Mouth/Throat: Uvula is midline and mucous membranes are normal. Posterior oropharyngeal erythema present. No oropharyngeal exudate or posterior oropharyngeal edema.  Eyes: Conjunctivae are normal.  Neck: Neck supple.  Cardiovascular: Normal rate, regular rhythm, normal heart sounds and intact distal pulses.   Pulmonary/Chest: Effort normal. No respiratory distress. She has wheezes. She has no rales. She exhibits no tenderness.  Lymphadenopathy:    She has no cervical adenopathy.  Neurological: She is alert and oriented to person, place, and time.  Skin: Skin is warm and dry. No rash noted.  Psychiatric: Affect normal.  Vitals reviewed.   Recent Results (from the past 2160 hour(s))  IBC panel     Status: Abnormal   Collection Time: 02/25/17  4:14 PM  Result Value Ref Range   Iron 14 (L) 42 - 145 ug/dL   Transferrin 242.0 212.0 - 360.0 mg/dL   Saturation  Ratios 4.1 (L) 20.0 - 50.0 %  Comp Met (CMET)     Status: Abnormal   Collection Time: 02/25/17  4:14 PM  Result Value Ref Range   Sodium 137 135 - 145 mEq/L   Potassium 3.8 3.5 - 5.1 mEq/L   Chloride 105 96 - 112 mEq/L   CO2 24 19 - 32 mEq/L   Glucose, Bld 111 (H) 70 - 99 mg/dL   BUN  13 6 - 23 mg/dL   Creatinine, Ser 0.78 0.40 - 1.20 mg/dL   Total Bilirubin 0.2 0.2 - 1.2 mg/dL   Alkaline Phosphatase 112 39 - 117 U/L   AST 24 0 - 37 U/L   ALT 30 0 - 35 U/L   Total Protein 7.8 6.0 - 8.3 g/dL   Albumin 4.3 3.5 - 5.2 g/dL   Calcium 9.4 8.4 - 10.5 mg/dL   GFR 89.58 >60.00 mL/min  CBC w/Diff     Status: None   Collection Time: 02/25/17  4:14 PM  Result Value Ref Range   WBC 9.1 4.0 - 10.5 K/uL   RBC 4.34 3.87 - 5.11 Mil/uL   Hemoglobin 12.6 12.0 - 15.0 g/dL   HCT 37.1 36.0 - 46.0 %   MCV 85.5 78.0 - 100.0 fl   MCHC 34.1 30.0 - 36.0 g/dL   RDW 13.0 11.5 - 15.5 %   Platelets 275.0 150.0 - 400.0 K/uL   Neutrophils Relative % 61.9 43.0 - 77.0 %   Lymphocytes Relative 26.2 12.0 - 46.0 %   Monocytes Relative 6.4 3.0 - 12.0 %   Eosinophils Relative 4.9 0.0 - 5.0 %   Basophils Relative 0.6 0.0 - 3.0 %   Neutro Abs 5.6 1.4 - 7.7 K/uL   Lymphs Abs 2.4 0.7 - 4.0 K/uL   Monocytes Absolute 0.6 0.1 - 1.0 K/uL   Eosinophils Absolute 0.4 0.0 - 0.7 K/uL   Basophils Absolute 0.1 0.0 - 0.1 K/uL  Urinalysis, Routine w reflex microscopic     Status: None   Collection Time: 03/10/17  1:29 PM  Result Value Ref Range   Color, Urine YELLOW YELLOW   APPearance CLEAR CLEAR   Specific Gravity, Urine 1.011 1.005 - 1.030   pH 5.0 5.0 - 8.0   Glucose, UA NEGATIVE NEGATIVE mg/dL   Hgb urine dipstick NEGATIVE NEGATIVE   Bilirubin Urine NEGATIVE NEGATIVE   Ketones, ur NEGATIVE NEGATIVE mg/dL   Protein, ur NEGATIVE NEGATIVE mg/dL   Nitrite NEGATIVE NEGATIVE   Leukocytes, UA NEGATIVE NEGATIVE    Comment: Microscopic not done on urines with negative protein, blood, leukocytes, nitrite, or glucose < 500  mg/dL.  Pregnancy, urine     Status: None   Collection Time: 03/10/17  1:29 PM  Result Value Ref Range   Preg Test, Ur NEGATIVE NEGATIVE    Comment:        THE SENSITIVITY OF THIS METHODOLOGY IS >20 mIU/mL.   Lipase, blood     Status: None   Collection Time: 03/10/17  2:19 PM  Result Value Ref Range   Lipase 20 11 - 51 U/L  Comprehensive metabolic panel     Status: Abnormal   Collection Time: 03/10/17  2:19 PM  Result Value Ref Range   Sodium 135 135 - 145 mmol/L   Potassium 4.1 3.5 - 5.1 mmol/L   Chloride 100 (L) 101 - 111 mmol/L   CO2 25 22 - 32 mmol/L   Glucose, Bld 98 65 - 99 mg/dL   BUN 14 6 - 20 mg/dL   Creatinine, Ser 0.74 0.44 - 1.00 mg/dL   Calcium 9.6 8.9 - 10.3 mg/dL   Total Protein 7.9 6.5 - 8.1 g/dL   Albumin 4.0 3.5 - 5.0 g/dL   AST 26 15 - 41 U/L   ALT 25 14 - 54 U/L   Alkaline Phosphatase 93 38 - 126 U/L   Total Bilirubin 0.2 (L) 0.3 - 1.2 mg/dL   GFR calc non  Af Amer >60 >60 mL/min   GFR calc Af Amer >60 >60 mL/min    Comment: (NOTE) The eGFR has been calculated using the CKD EPI equation. This calculation has not been validated in all clinical situations. eGFR's persistently <60 mL/min signify possible Chronic Kidney Disease.    Anion gap 10 5 - 15  CBC with Differential     Status: Abnormal   Collection Time: 03/10/17  2:19 PM  Result Value Ref Range   WBC 11.5 (H) 4.0 - 10.5 K/uL   RBC 4.43 3.87 - 5.11 MIL/uL   Hemoglobin 13.0 12.0 - 15.0 g/dL   HCT 39.3 36.0 - 46.0 %   MCV 88.7 78.0 - 100.0 fL   MCH 29.3 26.0 - 34.0 pg   MCHC 33.1 30.0 - 36.0 g/dL   RDW 12.9 11.5 - 15.5 %   Platelets 315 150 - 400 K/uL   Neutrophils Relative % 69 %   Neutro Abs 7.9 (H) 1.7 - 7.7 K/uL   Lymphocytes Relative 21 %   Lymphs Abs 2.4 0.7 - 4.0 K/uL   Monocytes Relative 6 %   Monocytes Absolute 0.7 0.1 - 1.0 K/uL   Eosinophils Relative 4 %   Eosinophils Absolute 0.5 0.0 - 0.7 K/uL   Basophils Relative 0 %   Basophils Absolute 0.0 0.0 - 0.1 K/uL  CBC w/Diff      Status: Abnormal   Collection Time: 03/16/17  4:21 PM  Result Value Ref Range   WBC 10.6 (H) 4.0 - 10.5 K/uL   RBC 4.24 3.87 - 5.11 Mil/uL   Hemoglobin 12.5 12.0 - 15.0 g/dL   HCT 36.8 36.0 - 46.0 %   MCV 86.8 78.0 - 100.0 fl   MCHC 33.8 30.0 - 36.0 g/dL   RDW 13.1 11.5 - 15.5 %   Platelets 301.0 150.0 - 400.0 K/uL   Neutrophils Relative % 65.0 43.0 - 77.0 %   Lymphocytes Relative 23.5 12.0 - 46.0 %   Monocytes Relative 6.6 3.0 - 12.0 %   Eosinophils Relative 4.3 0.0 - 5.0 %   Basophils Relative 0.6 0.0 - 3.0 %   Neutro Abs 6.9 1.4 - 7.7 K/uL   Lymphs Abs 2.5 0.7 - 4.0 K/uL   Monocytes Absolute 0.7 0.1 - 1.0 K/uL   Eosinophils Absolute 0.5 0.0 - 0.7 K/uL   Basophils Absolute 0.1 0.0 - 0.1 K/uL  CBC with Differential/Platelet     Status: Abnormal   Collection Time: 03/27/17 10:30 AM  Result Value Ref Range   WBC 7.2 4.0 - 10.5 K/uL   RBC 4.47 3.87 - 5.11 Mil/uL   Hemoglobin 13.0 12.0 - 15.0 g/dL   HCT 38.5 36.0 - 46.0 %   MCV 86.2 78.0 - 100.0 fl   MCHC 33.7 30.0 - 36.0 g/dL   RDW 13.3 11.5 - 15.5 %   Platelets 296.0 150.0 - 400.0 K/uL   Neutrophils Relative % 56.0 43.0 - 77.0 %   Lymphocytes Relative 30.6 12.0 - 46.0 %   Monocytes Relative 6.2 3.0 - 12.0 %   Eosinophils Relative 6.6 (H) 0.0 - 5.0 %   Basophils Relative 0.6 0.0 - 3.0 %   Neutro Abs 4.0 1.4 - 7.7 K/uL   Lymphs Abs 2.2 0.7 - 4.0 K/uL   Monocytes Absolute 0.4 0.1 - 1.0 K/uL   Eosinophils Absolute 0.5 0.0 - 0.7 K/uL   Basophils Absolute 0.0 0.0 - 0.1 K/uL  Comprehensive metabolic panel     Status: Abnormal   Collection  Time: 03/27/17 10:30 AM  Result Value Ref Range   Sodium 136 135 - 145 mEq/L   Potassium 3.9 3.5 - 5.1 mEq/L   Chloride 103 96 - 112 mEq/L   CO2 25 19 - 32 mEq/L   Glucose, Bld 110 (H) 70 - 99 mg/dL   BUN 11 6 - 23 mg/dL   Creatinine, Ser 0.68 0.40 - 1.20 mg/dL   Total Bilirubin 0.3 0.2 - 1.2 mg/dL   Alkaline Phosphatase 131 (H) 39 - 117 U/L   AST 30 0 - 37 U/L   ALT 50 (H) 0 - 35 U/L    Total Protein 7.7 6.0 - 8.3 g/dL   Albumin 4.2 3.5 - 5.2 g/dL   Calcium 9.7 8.4 - 10.5 mg/dL   GFR 104.90 >60.00 mL/min  Gastrointestinal Pathogen Panel PCR     Status: None   Collection Time: 03/30/17 11:17 AM  Result Value Ref Range   Campylobacter, PCR Not Detected    C. difficile Tox A/B, PCR Not Detected    E coli 0157, PCR Not Detected    E coli (ETEC) LT/ST PCR Not Detected    E coli (STEC) stx1/stx2, PCR Not Detected    Salmonella, PCR Not Detected    Shigella, PCR Not Detected    Norovirus, PCR Not Detected    Rotavirus A, PCR Not Detected    Giardia lamblia, PCR Not Detected    Cryptosporidium, PCR Not Detected     Comment:     ** Normal Reference Range for each Analyte: Not Detected **         ** The xTAG Gastrointestinal Pathogen Panel results are presumptive and must be confirmed by FDA-cleared tests or other acceptable reference methods.  The results of this test should not be used as the sole basis for diagnosis, treatment, or other patient management decisions.   Performed using the Luminex xTAG Gastrointestinal Pathogen Panel test kit.    Assessment/Plan: 1. Acute bronchitis with bronchospasm Albuterol neb given in office with improvement in wheeze. Supportive measures and OTC medications reviewed. Tussionex for cough. Plain Mucinex. Start doxycycline if symptoms not improving within 48 hours. Alarm signs/symptoms reviewed with patient that would prompt ER assessment.  - chlorpheniramine-HYDROcodone (TUSSIONEX PENNKINETIC ER) 10-8 MG/5ML SUER; Take 5 mLs by mouth every 12 (twelve) hours as needed.  Dispense: 140 mL; Refill: 0 - doxycycline (VIBRAMYCIN) 100 MG capsule; Take 1 capsule (100 mg total) by mouth 2 (two) times daily.  Dispense: 14 capsule; Refill: 0   Leeanne Rio, [place]

## 2017-04-03 NOTE — Telephone Encounter (Signed)
Informed pt that Dr. Bary CastillaWagoner okayed the refill and she could pick it up on 04/06/2017.

## 2017-04-06 ENCOUNTER — Telehealth: Payer: Self-pay | Admitting: Physician Assistant

## 2017-04-06 ENCOUNTER — Encounter: Payer: Self-pay | Admitting: Gastroenterology

## 2017-04-06 ENCOUNTER — Telehealth: Payer: Self-pay | Admitting: Gastroenterology

## 2017-04-06 ENCOUNTER — Ambulatory Visit (INDEPENDENT_AMBULATORY_CARE_PROVIDER_SITE_OTHER): Payer: BLUE CROSS/BLUE SHIELD | Admitting: Podiatry

## 2017-04-06 ENCOUNTER — Ambulatory Visit (INDEPENDENT_AMBULATORY_CARE_PROVIDER_SITE_OTHER): Payer: BLUE CROSS/BLUE SHIELD | Admitting: Physician Assistant

## 2017-04-06 ENCOUNTER — Encounter: Payer: Self-pay | Admitting: Physician Assistant

## 2017-04-06 VITALS — BP 112/78 | HR 85 | Temp 98.6°F | Resp 16 | Ht 67.0 in | Wt 222.0 lb

## 2017-04-06 DIAGNOSIS — M25571 Pain in right ankle and joints of right foot: Secondary | ICD-10-CM

## 2017-04-06 DIAGNOSIS — S82891G Other fracture of right lower leg, subsequent encounter for closed fracture with delayed healing: Secondary | ICD-10-CM

## 2017-04-06 DIAGNOSIS — M722 Plantar fascial fibromatosis: Secondary | ICD-10-CM | POA: Diagnosis not present

## 2017-04-06 DIAGNOSIS — M19171 Post-traumatic osteoarthritis, right ankle and foot: Secondary | ICD-10-CM

## 2017-04-06 DIAGNOSIS — M949 Disorder of cartilage, unspecified: Secondary | ICD-10-CM

## 2017-04-06 DIAGNOSIS — M899 Disorder of bone, unspecified: Secondary | ICD-10-CM

## 2017-04-06 DIAGNOSIS — R062 Wheezing: Secondary | ICD-10-CM | POA: Diagnosis not present

## 2017-04-06 MED ORDER — PREDNISONE 20 MG PO TABS: 40.0000 mg | ORAL_TABLET | Freq: Every day | ORAL | 0 refills | Status: DC

## 2017-04-06 MED ORDER — DICYCLOMINE HCL 20 MG PO TABS: 20.0000 mg | ORAL_TABLET | Freq: Three times a day (TID) | ORAL | 2 refills | Status: DC

## 2017-04-06 MED ORDER — IPRATROPIUM-ALBUTEROL 0.5-2.5 (3) MG/3ML IN SOLN
3.0000 mL | Freq: Once | RESPIRATORY_TRACT | Status: AC
  Administered 2017-04-06: 3 mL via RESPIRATORY_TRACT

## 2017-04-06 NOTE — Telephone Encounter (Signed)
Patient states she needs a refill of doxycyline. Informed patient we do not normally refill these types of medications and to contact her PCP. Patient states it's a stomach medication. Patient states she was mistaken and its called dicyclomine for her IBS. Informed patient that I will send a refill to her pharmacy.

## 2017-04-06 NOTE — Telephone Encounter (Signed)
[facility] [place] [facility] - Cli TELEPHONE ADVICE RECORD [facility] Patient Name: Diane HeftySHLEY Tousley DOB: 09/09/1981 Initial Comment Caller states that she recently saw the [place] cody for an URI and was told to call back if she has not gotten better. She is short of breath, coughing, and has a headache. She states that she hasVirtago and fluid in her ears. Nurse Assessment Nurse: Lane HackerHarley, RN, Elvin SoWindy Date/Time (Eastern Time): 04/06/2017 10:14:18 AM Confirm and document reason for call. If symptomatic, describe symptoms. ---Caller states that [place] for [place] URI and fluid in left ear. Given antibiotics. She was told to call back if she has not gotten better. She is short of breath. c/o dry cough, nasal congestion is still green, and has a headache (rates now at 6/10). She states that she has vertigo. Last used Albuterol inhaler 40 min. x 2 puffs. Unsure of fever (not high). Does the patient have any new or worsening symptoms? ---Yes Will a triage be completed? ---Yes Related visit to physician within the last 2 weeks? ---Yes Does the PT have any chronic conditions? (i.e. diabetes, asthma, etc.) ---Yes List chronic conditions. ---migraines, gastric ulcer - endoscopy due on June 1 Is the patient pregnant or possibly pregnant? (Ask all females between the ages of 2912-55) ---No Is this a behavioral health or substance abuse call? ---No Guidelines Guideline Title Affirmed Question Affirmed Notes Asthma Attack Asthma medicine (nebulizer or inhaler) is needed more frequently than q 4 hours to keep you comfortable Final Disposition User Go to ED Now (or PCP triage) Lane HackerHarley, RN, Elvin SoWindy Comments 11:15 am appt for herself with [place] today. Referrals REFERRED TO PCP OFFICE Disagree/Comply: Comply

## 2017-04-06 NOTE — Progress Notes (Signed)
Pre visit review using our clinic review tool, if applicable. No additional management support is needed unless otherwise documented below in the visit note. 

## 2017-04-06 NOTE — Telephone Encounter (Signed)
FYI, you are seeing this pt today.

## 2017-04-06 NOTE — Patient Instructions (Signed)
Please stay well hydrated. Start plain Mucinex to thin out congestion. Continue cough medication as directed. Continue antibiotic. Start the Steroid as directed to help open lungs. Follow-up with me on Thursday for reassessment.  If there are any worsening symptoms, please go to the ER.

## 2017-04-06 NOTE — Progress Notes (Signed)
Patient presents to clinic today c/o continued chest congestion with wheezing, not fully alleviated with her albuterol. Patient is taking Doxycycline as directed without side effect. Cough syrup is effective but chest tightness is still significant. Denies new symptoms. Endorses resolution of fever.   Past Medical History:  Diagnosis Date  . ADD (attention deficit disorder with hyperactivity)    no current med.  . Anemia    takes iron supplement  . Anxiety   . Bronchitis    uses an inhaler  . Choking    due to food and pills get stuck  . Cough 10/16/2016  . Depression   . Family history of adverse reaction to anesthesia    pt's father has hx. of being hard to wake up post-op  . GERD (gastroesophageal reflux disease)   . History of gastric ulcer   . History of kidney stones   . IBS (irritable bowel syndrome)   . Migraines   . Osteoarthritis    right ankle  . Osteochondral lesion 09/2016   right ankle  . Plantar fasciitis of left foot   . PONV (postoperative nausea and vomiting)   . Pseudotumor cerebri    at back head  . Wears partial dentures    upper    Current Outpatient Prescriptions on File Prior to Visit  Medication Sig Dispense Refill  . albuterol (PROVENTIL HFA;VENTOLIN HFA) 108 (90 Base) MCG/ACT inhaler Inhale into the lungs as needed for wheezing or shortness of breath.    . cetirizine (ZYRTEC) 10 MG tablet Take 10 mg by mouth daily.    . chlorpheniramine-HYDROcodone (TUSSIONEX PENNKINETIC ER) 10-8 MG/5ML SUER Take 5 mLs by mouth every 12 (twelve) hours as needed. 140 mL 0  . diazepam (VALIUM) 5 MG tablet Take 1 tablet (5 mg total) by mouth every 12 (twelve) hours as needed for anxiety. 60 tablet 5  . doxycycline (VIBRAMYCIN) 100 MG capsule Take 1 capsule (100 mg total) by mouth 2 (two) times daily. 14 capsule 0  . gabapentin (NEURONTIN) 300 MG capsule Take 2 capsules (600 mg total) by mouth 3 (three) times daily. (Patient taking differently: Take 600-900 mg by  mouth 2 (two) times daily. Take 2 capsules (600 mg) in the morning and Take 4 capsules (900 mg) in the evening.) 180 capsule 5  . HYDROcodone-acetaminophen (NORCO/VICODIN) 5-325 MG tablet Take one tablet every 4-6 hours prn foot pain 30 tablet 0  . Magnesium 400 MG TABS Take 1,200 mg by mouth daily.     Marland Kitchen oxybutynin (DITROPAN-XL) 10 MG 24 hr tablet TAKE ONE TABLET BY MOUTH AT BEDTIME 30 tablet 5  . RABEprazole (ACIPHEX) 20 MG tablet Take 1 tablet (20 mg total) by mouth 2 (two) times daily. 60 tablet 3  . sucralfate (CARAFATE) 1 g tablet Take 1 tablet (1 g total) by mouth 4 (four) times daily -  with meals and at bedtime. 120 tablet 0  . tiZANidine (ZANAFLEX) 4 MG tablet Take 4 mg by mouth every 6 (six) hours as needed for muscle spasms.     Marland Kitchen venlafaxine XR (EFFEXOR-XR) 150 MG 24 hr capsule Take 2 capsules (300 mg total) by mouth daily before breakfast. 60 capsule 11   Current Facility-Administered Medications on File Prior to Visit  Medication Dose Route Frequency Provider Last Rate Last Dose  . triamcinolone acetonide (KENALOG) 10 MG/ML injection 10 mg  10 mg Other Once Landis Martins, DPM        Allergies  Allergen Reactions  . Nickel     "  any jewelry that's not real"/ rash and itching   . Nsaids Other (See Comments)    DUE TO HISTORY OF GASTRIC ULCER  . Adhesive [Tape] Rash  . Metoclopramide Hcl Other (See Comments)    RESTLESSNESS/JITTERY  . Prochlorperazine Edisylate Other (See Comments)    RESTLESSNESS/JITTERY     Family History  Problem Relation Age of Onset  . Brain cancer Father   . Anesthesia problems Father        hard to wake up post-op  . Lung cancer Father   . Breast cancer Maternal Grandmother   . Ovarian cancer Maternal Grandmother   . Diabetes Maternal Grandmother   . Heart disease Maternal Grandmother   . Irritable bowel syndrome Mother   . Prostate cancer Maternal Grandfather   . Melanoma Maternal Grandfather   . Colon cancer Neg Hx     Social History    Social History  . Marital status: Married    Spouse name: N/A  . Number of children: 2  . Years of education: N/A   Occupational History  . 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 Smoking status: Current Some Day Smoker    Packs/day: 0.50    Years: 15.00    Types: Cigarettes  . Smokeless tobacco: Never Used  . Alcohol use 0.0 oz/week     Comment: occasionally - glass wine  . Drug use: No  . Sexual activity: Not Currently   Other Topics Concern  . None   Social History Narrative  . None    Review of Systems - See HPI.  All other ROS are negative.  BP 118/82   Pulse 92   Temp 98.6 F (37 C) (Oral)   Resp 16   Ht '5\' 7"'  (1.702 m)   Wt 222 lb (100.7 kg)   LMP 03/22/2017   SpO2 98%   BMI 34.77 kg/m   Physical Exam  Constitutional: She is well-developed, well-nourished, and in no distress.  HENT:  Head: Normocephalic and atraumatic.  Right Ear: External ear normal.  Left Ear: External ear normal.  Nose: Nose normal.  Mouth/Throat: Oropharynx is clear and moist. No oropharyngeal exudate.  TM within normal limits.  Eyes: Conjunctivae are normal.  Neck: Neck supple.  Cardiovascular: Normal rate, regular rhythm, normal heart sounds and intact distal pulses.   Pulmonary/Chest: Effort normal. No respiratory distress. She has wheezes. She has no rales. She exhibits no tenderness.  Skin: Skin is warm and dry. No rash noted.  Psychiatric: Affect normal.  Vitals reviewed.   Recent Results (from the past 2160 hour(s))  IBC panel     Status: Abnormal   Collection Time: 02/25/17  4:14 PM  Result Value Ref Range   Iron 14 (L) 42 - 145 ug/dL   Transferrin 242.0 212.0 - 360.0 mg/dL   Saturation Ratios 4.1 (L) 20.0 - 50.0 %  Comp Met (CMET)     Status: Abnormal   Collection Time: 02/25/17  4:14 PM  Result Value Ref Range   Sodium 137 135 - 145 mEq/L   Potassium 3.8 3.5 - 5.1 mEq/L   Chloride 105 96 - 112 mEq/L   CO2 24 19 - 32 mEq/L    Glucose, Bld 111 (H) 70 - 99 mg/dL   BUN 13 6 - 23 mg/dL   Creatinine, Ser 0.78 0.40 - 1.20 mg/dL   Total Bilirubin 0.2 0.2 - 1.2 mg/dL   Alkaline Phosphatase 112 39 - 117 U/L  AST 24 0 - 37 U/L   ALT 30 0 - 35 U/L   Total Protein 7.8 6.0 - 8.3 g/dL   Albumin 4.3 3.5 - 5.2 g/dL   Calcium 9.4 8.4 - 10.5 mg/dL   GFR 89.58 >60.00 mL/min  CBC w/Diff     Status: None   Collection Time: 02/25/17  4:14 PM  Result Value Ref Range   WBC 9.1 4.0 - 10.5 K/uL   RBC 4.34 3.87 - 5.11 Mil/uL   Hemoglobin 12.6 12.0 - 15.0 g/dL   HCT 37.1 36.0 - 46.0 %   MCV 85.5 78.0 - 100.0 fl   MCHC 34.1 30.0 - 36.0 g/dL   RDW 13.0 11.5 - 15.5 %   Platelets 275.0 150.0 - 400.0 K/uL   Neutrophils Relative % 61.9 43.0 - 77.0 %   Lymphocytes Relative 26.2 12.0 - 46.0 %   Monocytes Relative 6.4 3.0 - 12.0 %   Eosinophils Relative 4.9 0.0 - 5.0 %   Basophils Relative 0.6 0.0 - 3.0 %   Neutro Abs 5.6 1.4 - 7.7 K/uL   Lymphs Abs 2.4 0.7 - 4.0 K/uL   Monocytes Absolute 0.6 0.1 - 1.0 K/uL   Eosinophils Absolute 0.4 0.0 - 0.7 K/uL   Basophils Absolute 0.1 0.0 - 0.1 K/uL  Urinalysis, Routine w reflex microscopic     Status: None   Collection Time: 03/10/17  1:29 PM  Result Value Ref Range   Color, Urine YELLOW YELLOW   APPearance CLEAR CLEAR   Specific Gravity, Urine 1.011 1.005 - 1.030   pH 5.0 5.0 - 8.0   Glucose, UA NEGATIVE NEGATIVE mg/dL   Hgb urine dipstick NEGATIVE NEGATIVE   Bilirubin Urine NEGATIVE NEGATIVE   Ketones, ur NEGATIVE NEGATIVE mg/dL   Protein, ur NEGATIVE NEGATIVE mg/dL   Nitrite NEGATIVE NEGATIVE   Leukocytes, UA NEGATIVE NEGATIVE    Comment: Microscopic not done on urines with negative protein, blood, leukocytes, nitrite, or glucose < 500 mg/dL.  Pregnancy, urine     Status: None   Collection Time: 03/10/17  1:29 PM  Result Value Ref Range   Preg Test, Ur NEGATIVE NEGATIVE    Comment:        THE SENSITIVITY OF THIS METHODOLOGY IS >20 mIU/mL.   Lipase, blood     Status: None    Collection Time: 03/10/17  2:19 PM  Result Value Ref Range   Lipase 20 11 - 51 U/L  Comprehensive metabolic panel     Status: Abnormal   Collection Time: 03/10/17  2:19 PM  Result Value Ref Range   Sodium 135 135 - 145 mmol/L   Potassium 4.1 3.5 - 5.1 mmol/L   Chloride 100 (L) 101 - 111 mmol/L   CO2 25 22 - 32 mmol/L   Glucose, Bld 98 65 - 99 mg/dL   BUN 14 6 - 20 mg/dL   Creatinine, Ser 0.74 0.44 - 1.00 mg/dL   Calcium 9.6 8.9 - 10.3 mg/dL   Total Protein 7.9 6.5 - 8.1 g/dL   Albumin 4.0 3.5 - 5.0 g/dL   AST 26 15 - 41 U/L   ALT 25 14 - 54 U/L   Alkaline Phosphatase 93 38 - 126 U/L   Total Bilirubin 0.2 (L) 0.3 - 1.2 mg/dL   GFR calc non Af Amer >60 >60 mL/min   GFR calc Af Amer >60 >60 mL/min    Comment: (NOTE) The eGFR has been calculated using the CKD EPI equation. This calculation has not been  validated in all clinical situations. eGFR's persistently <60 mL/min signify possible Chronic Kidney Disease.    Anion gap 10 5 - 15  CBC with Differential     Status: Abnormal   Collection Time: 03/10/17  2:19 PM  Result Value Ref Range   WBC 11.5 (H) 4.0 - 10.5 K/uL   RBC 4.43 3.87 - 5.11 MIL/uL   Hemoglobin 13.0 12.0 - 15.0 g/dL   HCT 39.3 36.0 - 46.0 %   MCV 88.7 78.0 - 100.0 fL   MCH 29.3 26.0 - 34.0 pg   MCHC 33.1 30.0 - 36.0 g/dL   RDW 12.9 11.5 - 15.5 %   Platelets 315 150 - 400 K/uL   Neutrophils Relative % 69 %   Neutro Abs 7.9 (H) 1.7 - 7.7 K/uL   Lymphocytes Relative 21 %   Lymphs Abs 2.4 0.7 - 4.0 K/uL   Monocytes Relative 6 %   Monocytes Absolute 0.7 0.1 - 1.0 K/uL   Eosinophils Relative 4 %   Eosinophils Absolute 0.5 0.0 - 0.7 K/uL   Basophils Relative 0 %   Basophils Absolute 0.0 0.0 - 0.1 K/uL  CBC w/Diff     Status: Abnormal   Collection Time: 03/16/17  4:21 PM  Result Value Ref Range   WBC 10.6 (H) 4.0 - 10.5 K/uL   RBC 4.24 3.87 - 5.11 Mil/uL   Hemoglobin 12.5 12.0 - 15.0 g/dL   HCT 36.8 36.0 - 46.0 %   MCV 86.8 78.0 - 100.0 fl   MCHC 33.8 30.0  - 36.0 g/dL   RDW 13.1 11.5 - 15.5 %   Platelets 301.0 150.0 - 400.0 K/uL   Neutrophils Relative % 65.0 43.0 - 77.0 %   Lymphocytes Relative 23.5 12.0 - 46.0 %   Monocytes Relative 6.6 3.0 - 12.0 %   Eosinophils Relative 4.3 0.0 - 5.0 %   Basophils Relative 0.6 0.0 - 3.0 %   Neutro Abs 6.9 1.4 - 7.7 K/uL   Lymphs Abs 2.5 0.7 - 4.0 K/uL   Monocytes Absolute 0.7 0.1 - 1.0 K/uL   Eosinophils Absolute 0.5 0.0 - 0.7 K/uL   Basophils Absolute 0.1 0.0 - 0.1 K/uL  CBC with Differential/Platelet     Status: Abnormal   Collection Time: 03/27/17 10:30 AM  Result Value Ref Range   WBC 7.2 4.0 - 10.5 K/uL   RBC 4.47 3.87 - 5.11 Mil/uL   Hemoglobin 13.0 12.0 - 15.0 g/dL   HCT 38.5 36.0 - 46.0 %   MCV 86.2 78.0 - 100.0 fl   MCHC 33.7 30.0 - 36.0 g/dL   RDW 13.3 11.5 - 15.5 %   Platelets 296.0 150.0 - 400.0 K/uL   Neutrophils Relative % 56.0 43.0 - 77.0 %   Lymphocytes Relative 30.6 12.0 - 46.0 %   Monocytes Relative 6.2 3.0 - 12.0 %   Eosinophils Relative 6.6 (H) 0.0 - 5.0 %   Basophils Relative 0.6 0.0 - 3.0 %   Neutro Abs 4.0 1.4 - 7.7 K/uL   Lymphs Abs 2.2 0.7 - 4.0 K/uL   Monocytes Absolute 0.4 0.1 - 1.0 K/uL   Eosinophils Absolute 0.5 0.0 - 0.7 K/uL   Basophils Absolute 0.0 0.0 - 0.1 K/uL  Comprehensive metabolic panel     Status: Abnormal   Collection Time: 03/27/17 10:30 AM  Result Value Ref Range   Sodium 136 135 - 145 mEq/L   Potassium 3.9 3.5 - 5.1 mEq/L   Chloride 103 96 - 112 mEq/L  CO2 25 19 - 32 mEq/L   Glucose, Bld 110 (H) 70 - 99 mg/dL   BUN 11 6 - 23 mg/dL   Creatinine, Ser 0.68 0.40 - 1.20 mg/dL   Total Bilirubin 0.3 0.2 - 1.2 mg/dL   Alkaline Phosphatase 131 (H) 39 - 117 U/L   AST 30 0 - 37 U/L   ALT 50 (H) 0 - 35 U/L   Total Protein 7.7 6.0 - 8.3 g/dL   Albumin 4.2 3.5 - 5.2 g/dL   Calcium 9.7 8.4 - 10.5 mg/dL   GFR 104.90 >60.00 mL/min  Gastrointestinal Pathogen Panel PCR     Status: None   Collection Time: 03/30/17 11:17 AM  Result Value Ref Range    Campylobacter, PCR Not Detected    C. difficile Tox A/B, PCR Not Detected    E coli 0157, PCR Not Detected    E coli (ETEC) LT/ST PCR Not Detected    E coli (STEC) stx1/stx2, PCR Not Detected    Salmonella, PCR Not Detected    Shigella, PCR Not Detected    Norovirus, PCR Not Detected    Rotavirus A, PCR Not Detected    Giardia lamblia, PCR Not Detected    Cryptosporidium, PCR Not Detected     Comment:     ** Normal Reference Range for each Analyte: Not Detected **         ** The xTAG Gastrointestinal Pathogen Panel results are presumptive and must be confirmed by FDA-cleared tests or other acceptable reference methods.  The results of this test should not be used as the sole basis for diagnosis, treatment, or other patient management decisions.   Performed using the [employer] xTAG Gastrointestinal Pathogen Panel test kit.   POCT Influenza A/B     Status: Normal   Collection Time: 04/03/17  9:21 AM  Result Value Ref Range   Influenza A, POC Negative Negative   Influenza B, POC Negative Negative    Assessment/Plan: 1. Wheezing Continued despite albuterol inhaler. Is taking Doxycycline as directed. Will continue antibiotic. Start Prednisone burst. Continue Mucinex and cough medication. Return precautions discussed with patient.  - ipratropium-albuterol (DUONEB) 0.5-2.5 (3) MG/3ML nebulizer solution 3 mL; Take 3 mLs by nebulization once.   Leeanne Rio, PA-C

## 2017-04-07 NOTE — Telephone Encounter (Signed)
Results have been explained to patient, pt expressed understanding.  Order placed for CPAP  Appt scheduled with Rubye Oaksammy Parrett, NP on  06/08/17 at 2:15. Nothing further needed.

## 2017-04-07 NOTE — Progress Notes (Signed)
Patient came in today to pick up brace and foot orthotics.Marland Kitchen.Marland Kitchen.Marland Kitchen.Richie style brace was donned and there werent any issues with fit or function...Marland Kitchen.offered good ankle stability....same with the F/O....  Codes to be charged L1970, [id number].Marland Kitchen.Marland Kitchen.for brace and L3020 for F/O

## 2017-04-09 ENCOUNTER — Ambulatory Visit (INDEPENDENT_AMBULATORY_CARE_PROVIDER_SITE_OTHER): Payer: BLUE CROSS/BLUE SHIELD

## 2017-04-09 ENCOUNTER — Ambulatory Visit: Payer: BLUE CROSS/BLUE SHIELD | Admitting: Family Medicine

## 2017-04-09 ENCOUNTER — Ambulatory Visit (INDEPENDENT_AMBULATORY_CARE_PROVIDER_SITE_OTHER): Payer: BLUE CROSS/BLUE SHIELD | Admitting: Physician Assistant

## 2017-04-09 ENCOUNTER — Encounter: Payer: Self-pay | Admitting: Physician Assistant

## 2017-04-09 VITALS — BP 128/88 | HR 96 | Temp 98.9°F | Resp 18 | Ht 67.0 in | Wt 224.0 lb

## 2017-04-09 DIAGNOSIS — R062 Wheezing: Secondary | ICD-10-CM

## 2017-04-09 MED ORDER — HYDROCODONE-HOMATROPINE 5-1.5 MG/5ML PO SYRP: 5.0000 mL | ORAL_SOLUTION | Freq: Three times a day (TID) | ORAL | 0 refills | Status: DC | PRN

## 2017-04-09 MED ORDER — BECLOMETHASONE DIPROPIONATE 40 MCG/ACT IN AERS: 2.0000 | INHALATION_SPRAY | Freq: Two times a day (BID) | RESPIRATORY_TRACT | 0 refills | Status: DC

## 2017-04-09 NOTE — Progress Notes (Signed)
Patient presents to clinic today for follow-up of bronchitis with bronchospasm and reactive airway. Patient is using steroids as directed in addition to her antibiotic and cough medication. Notes improvement in level of congestion and chest tightness. Wheeze is still present but feels more in upper airway. Denies fever, chills. Notes fatigue.   Past Medical History:  Diagnosis Date  . ADD (attention deficit disorder with hyperactivity)    no current med.  . Anemia    takes iron supplement  . Anxiety   . Bronchitis    uses an inhaler  . Choking    due to food and pills get stuck  . Cough 10/16/2016  . Depression   . Family history of adverse reaction to anesthesia    pt's father has hx. of being hard to wake up post-op  . GERD (gastroesophageal reflux disease)   . History of gastric ulcer   . History of kidney stones   . IBS (irritable bowel syndrome)   . Migraines   . Osteoarthritis    right ankle  . Osteochondral lesion 09/2016   right ankle  . Plantar fasciitis of left foot   . PONV (postoperative nausea and vomiting)   . Pseudotumor cerebri    at back head  . Wears partial dentures    upper    Current Outpatient Prescriptions on File Prior to Visit  Medication Sig Dispense Refill  . albuterol (PROVENTIL HFA;VENTOLIN HFA) 108 (90 Base) MCG/ACT inhaler Inhale into the lungs as needed for wheezing or shortness of breath.    . cetirizine (ZYRTEC) 10 MG tablet Take 10 mg by mouth daily.    . chlorpheniramine-HYDROcodone (TUSSIONEX PENNKINETIC ER) 10-8 MG/5ML SUER Take 5 mLs by mouth every 12 (twelve) hours as needed. 140 mL 0  . diazepam (VALIUM) 5 MG tablet Take 1 tablet (5 mg total) by mouth every 12 (twelve) hours as needed for anxiety. 60 tablet 5  . dicyclomine (BENTYL) 20 MG tablet Take 1 tablet (20 mg total) by mouth 3 (three) times daily before meals. 90 tablet 2  . doxycycline (VIBRAMYCIN) 100 MG capsule Take 1 capsule (100 mg total) by mouth 2 (two) times daily.  14 capsule 0  . gabapentin (NEURONTIN) 300 MG capsule Take 2 capsules (600 mg total) by mouth 3 (three) times daily. (Patient taking differently: Take 600-900 mg by mouth 2 (two) times daily. Take 2 capsules (600 mg) in the morning and Take 4 capsules (900 mg) in the evening.) 180 capsule 5  . HYDROcodone-acetaminophen (NORCO/VICODIN) 5-325 MG tablet Take one tablet every 4-6 hours prn foot pain 30 tablet 0  . Magnesium 400 MG TABS Take 1,200 mg by mouth daily.     Marland Kitchen oxybutynin (DITROPAN-XL) 10 MG 24 hr tablet TAKE ONE TABLET BY MOUTH AT BEDTIME 30 tablet 5  . predniSONE (DELTASONE) 20 MG tablet Take 2 tablets (40 mg total) by mouth daily with breakfast. 10 tablet 0  . RABEprazole (ACIPHEX) 20 MG tablet Take 1 tablet (20 mg total) by mouth 2 (two) times daily. 60 tablet 3  . sucralfate (CARAFATE) 1 g tablet Take 1 tablet (1 g total) by mouth 4 (four) times daily -  with meals and at bedtime. 120 tablet 0  . tiZANidine (ZANAFLEX) 4 MG tablet Take 4 mg by mouth every 6 (six) hours as needed for muscle spasms.     Marland Kitchen venlafaxine XR (EFFEXOR-XR) 150 MG 24 hr capsule Take 2 capsules (300 mg total) by mouth daily before breakfast. 60 capsule  11   Current Facility-Administered Medications on File Prior to Visit  Medication Dose Route Frequency Provider Last Rate Last Dose  . triamcinolone acetonide (KENALOG) 10 MG/ML injection 10 mg  10 mg Other Once Landis Martins, DPM        Allergies  Allergen Reactions  . Nickel     "any jewelry that's not real"/ rash and itching   . Nsaids Other (See Comments)    DUE TO HISTORY OF GASTRIC ULCER  . Adhesive [Tape] Rash  . Metoclopramide Hcl Other (See Comments)    RESTLESSNESS/JITTERY  . Prochlorperazine Edisylate Other (See Comments)    RESTLESSNESS/JITTERY     Family History  Problem Relation Age of Onset  . Brain cancer Father   . Anesthesia problems Father        hard to wake up post-op  . Lung cancer Father   . Breast cancer Maternal  Grandmother   . Ovarian cancer Maternal Grandmother   . Diabetes Maternal Grandmother   . Heart disease Maternal Grandmother   . Irritable bowel syndrome Mother   . Prostate cancer Maternal Grandfather   . Melanoma Maternal Grandfather   . Colon cancer Neg Hx     Social History   Social History  . Marital status: Married    Spouse name: N/A  . Number of children: 2  . Years of education: N/A   Occupational History  . 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 Smoking status: Current Some Day Smoker    Packs/day: 0.50    Years: 15.00    Types: Cigarettes  . Smokeless tobacco: Never Used  . Alcohol use 0.0 oz/week     Comment: occasionally - glass wine  . Drug use: No  . Sexual activity: Not Currently   Other Topics Concern  . None   Social History Narrative  . None   Review of Systems - See HPI.  All other ROS are negative.  BP 128/88 (BP Location: Right Arm, Patient Position: Sitting, Cuff Size: Large)   Pulse 96   Temp 99 F (37.2 C) (Oral)   Resp 18   Ht _0  (1.702 m)   Wt 224 lb (101.6 kg)   LMP 03/22/2017   SpO2 98%   BMI 35.08 kg/m   Physical Exam  Constitutional: She is oriented to person, place, and time and well-developed, well-nourished, and in no distress.  HENT:  Head: Normocephalic and atraumatic.  Eyes: Conjunctivae are normal.  Cardiovascular: Normal rate, regular rhythm, normal heart sounds and intact distal pulses.   Pulmonary/Chest: Effort normal. No respiratory distress. She has wheezes (faint wheeze bilaterally ). She has no rales. She exhibits no tenderness.  Neurological: She is alert and oriented to person, place, and time.  Skin: Skin is warm and dry. No rash noted.  Psychiatric: Affect normal.  Vitals reviewed.  Recent Results (from the past 2160 hour(s))  IBC panel     Status: Abnormal   Collection Time: 02/25/17  4:14 PM  Result Value Ref Range   Iron 14 (L) 42 - 145 ug/dL   Transferrin 242.0  212.0 - 360.0 mg/dL   Saturation Ratios 4.1 (L) 20.0 - 50.0 %  Comp Met (CMET)     Status: Abnormal   Collection Time: 02/25/17  4:14 PM  Result Value Ref Range   Sodium 137 135 - 145 mEq/L   Potassium 3.8 3.5 - 5.1 mEq/L   Chloride 105 96 - 112 mEq/L  CO2 24 19 - 32 mEq/L   Glucose, Bld 111 (H) 70 - 99 mg/dL   BUN 13 6 - 23 mg/dL   Creatinine, Ser 0.78 0.40 - 1.20 mg/dL   Total Bilirubin 0.2 0.2 - 1.2 mg/dL   Alkaline Phosphatase 112 39 - 117 U/L   AST 24 0 - 37 U/L   ALT 30 0 - 35 U/L   Total Protein 7.8 6.0 - 8.3 g/dL   Albumin 4.3 3.5 - 5.2 g/dL   Calcium 9.4 8.4 - 10.5 mg/dL   GFR 89.58 >60.00 mL/min  CBC w/Diff     Status: None   Collection Time: 02/25/17  4:14 PM  Result Value Ref Range   WBC 9.1 4.0 - 10.5 K/uL   RBC 4.34 3.87 - 5.11 Mil/uL   Hemoglobin 12.6 12.0 - 15.0 g/dL   HCT 37.1 36.0 - 46.0 %   MCV 85.5 78.0 - 100.0 fl   MCHC 34.1 30.0 - 36.0 g/dL   RDW 13.0 11.5 - 15.5 %   Platelets 275.0 150.0 - 400.0 K/uL   Neutrophils Relative % 61.9 43.0 - 77.0 %   Lymphocytes Relative 26.2 12.0 - 46.0 %   Monocytes Relative 6.4 3.0 - 12.0 %   Eosinophils Relative 4.9 0.0 - 5.0 %   Basophils Relative 0.6 0.0 - 3.0 %   Neutro Abs 5.6 1.4 - 7.7 K/uL   Lymphs Abs 2.4 0.7 - 4.0 K/uL   Monocytes Absolute 0.6 0.1 - 1.0 K/uL   Eosinophils Absolute 0.4 0.0 - 0.7 K/uL   Basophils Absolute 0.1 0.0 - 0.1 K/uL  Urinalysis, Routine w reflex microscopic     Status: None   Collection Time: 03/10/17  1:29 PM  Result Value Ref Range   Color, Urine YELLOW YELLOW   APPearance CLEAR CLEAR   Specific Gravity, Urine 1.011 1.005 - 1.030   pH 5.0 5.0 - 8.0   Glucose, UA NEGATIVE NEGATIVE mg/dL   Hgb urine dipstick NEGATIVE NEGATIVE   Bilirubin Urine NEGATIVE NEGATIVE   Ketones, ur NEGATIVE NEGATIVE mg/dL   Protein, ur NEGATIVE NEGATIVE mg/dL   Nitrite NEGATIVE NEGATIVE   Leukocytes, UA NEGATIVE NEGATIVE    Comment: Microscopic not done on urines with negative protein, blood,  leukocytes, nitrite, or glucose < 500 mg/dL.  Pregnancy, urine     Status: None   Collection Time: 03/10/17  1:29 PM  Result Value Ref Range   Preg Test, Ur NEGATIVE NEGATIVE    Comment:        THE SENSITIVITY OF THIS METHODOLOGY IS >20 mIU/mL.   Lipase, blood     Status: None   Collection Time: 03/10/17  2:19 PM  Result Value Ref Range   Lipase 20 11 - 51 U/L  Comprehensive metabolic panel     Status: Abnormal   Collection Time: 03/10/17  2:19 PM  Result Value Ref Range   Sodium 135 135 - 145 mmol/L   Potassium 4.1 3.5 - 5.1 mmol/L   Chloride 100 (L) 101 - 111 mmol/L   CO2 25 22 - 32 mmol/L   Glucose, Bld 98 65 - 99 mg/dL   BUN 14 6 - 20 mg/dL   Creatinine, Ser 0.74 0.44 - 1.00 mg/dL   Calcium 9.6 8.9 - 10.3 mg/dL   Total Protein 7.9 6.5 - 8.1 g/dL   Albumin 4.0 3.5 - 5.0 g/dL   AST 26 15 - 41 U/L   ALT 25 14 - 54 U/L   Alkaline Phosphatase 93  38 - 126 U/L   Total Bilirubin 0.2 (L) 0.3 - 1.2 mg/dL   GFR calc non Af Amer >60 >60 mL/min   GFR calc Af Amer >60 >60 mL/min    Comment: (NOTE) The eGFR has been calculated using the CKD EPI equation. This calculation has not been validated in all clinical situations. eGFR's persistently <60 mL/min signify possible Chronic Kidney Disease.    Anion gap 10 5 - 15  CBC with Differential     Status: Abnormal   Collection Time: 03/10/17  2:19 PM  Result Value Ref Range   WBC 11.5 (H) 4.0 - 10.5 K/uL   RBC 4.43 3.87 - 5.11 MIL/uL   Hemoglobin 13.0 12.0 - 15.0 g/dL   HCT 39.3 36.0 - 46.0 %   MCV 88.7 78.0 - 100.0 fL   MCH 29.3 26.0 - 34.0 pg   MCHC 33.1 30.0 - 36.0 g/dL   RDW 12.9 11.5 - 15.5 %   Platelets 315 150 - 400 K/uL   Neutrophils Relative % 69 %   Neutro Abs 7.9 (H) 1.7 - 7.7 K/uL   Lymphocytes Relative 21 %   Lymphs Abs 2.4 0.7 - 4.0 K/uL   Monocytes Relative 6 %   Monocytes Absolute 0.7 0.1 - 1.0 K/uL   Eosinophils Relative 4 %   Eosinophils Absolute 0.5 0.0 - 0.7 K/uL   Basophils Relative 0 %   Basophils  Absolute 0.0 0.0 - 0.1 K/uL  CBC w/Diff     Status: Abnormal   Collection Time: 03/16/17  4:21 PM  Result Value Ref Range   WBC 10.6 (H) 4.0 - 10.5 K/uL   RBC 4.24 3.87 - 5.11 Mil/uL   Hemoglobin 12.5 12.0 - 15.0 g/dL   HCT 36.8 36.0 - 46.0 %   MCV 86.8 78.0 - 100.0 fl   MCHC 33.8 30.0 - 36.0 g/dL   RDW 13.1 11.5 - 15.5 %   Platelets 301.0 150.0 - 400.0 K/uL   Neutrophils Relative % 65.0 43.0 - 77.0 %   Lymphocytes Relative 23.5 12.0 - 46.0 %   Monocytes Relative 6.6 3.0 - 12.0 %   Eosinophils Relative 4.3 0.0 - 5.0 %   Basophils Relative 0.6 0.0 - 3.0 %   Neutro Abs 6.9 1.4 - 7.7 K/uL   Lymphs Abs 2.5 0.7 - 4.0 K/uL   Monocytes Absolute 0.7 0.1 - 1.0 K/uL   Eosinophils Absolute 0.5 0.0 - 0.7 K/uL   Basophils Absolute 0.1 0.0 - 0.1 K/uL  CBC with Differential/Platelet     Status: Abnormal   Collection Time: 03/27/17 10:30 AM  Result Value Ref Range   WBC 7.2 4.0 - 10.5 K/uL   RBC 4.47 3.87 - 5.11 Mil/uL   Hemoglobin 13.0 12.0 - 15.0 g/dL   HCT 38.5 36.0 - 46.0 %   MCV 86.2 78.0 - 100.0 fl   MCHC 33.7 30.0 - 36.0 g/dL   RDW 13.3 11.5 - 15.5 %   Platelets 296.0 150.0 - 400.0 K/uL   Neutrophils Relative % 56.0 43.0 - 77.0 %   Lymphocytes Relative 30.6 12.0 - 46.0 %   Monocytes Relative 6.2 3.0 - 12.0 %   Eosinophils Relative 6.6 (H) 0.0 - 5.0 %   Basophils Relative 0.6 0.0 - 3.0 %   Neutro Abs 4.0 1.4 - 7.7 K/uL   Lymphs Abs 2.2 0.7 - 4.0 K/uL   Monocytes Absolute 0.4 0.1 - 1.0 K/uL   Eosinophils Absolute 0.5 0.0 - 0.7 K/uL   Basophils  Absolute 0.0 0.0 - 0.1 K/uL  Comprehensive metabolic panel     Status: Abnormal   Collection Time: 03/27/17 10:30 AM  Result Value Ref Range   Sodium 136 135 - 145 mEq/L   Potassium 3.9 3.5 - 5.1 mEq/L   Chloride 103 96 - 112 mEq/L   CO2 25 19 - 32 mEq/L   Glucose, Bld 110 (H) 70 - 99 mg/dL   BUN 11 6 - 23 mg/dL   Creatinine, Ser 0.68 0.40 - 1.20 mg/dL   Total Bilirubin 0.3 0.2 - 1.2 mg/dL   Alkaline Phosphatase 131 (H) 39 - 117 U/L     AST 30 0 - 37 U/L   ALT 50 (H) 0 - 35 U/L   Total Protein 7.7 6.0 - 8.3 g/dL   Albumin 4.2 3.5 - 5.2 g/dL   Calcium 9.7 8.4 - 10.5 mg/dL   GFR 104.90 >60.00 mL/min  Gastrointestinal Pathogen Panel PCR     Status: None   Collection Time: 03/30/17 11:17 AM  Result Value Ref Range   Campylobacter, PCR Not Detected    C. difficile Tox A/B, PCR Not Detected    E coli 0157, PCR Not Detected    E coli (ETEC) LT/ST PCR Not Detected    E coli (STEC) stx1/stx2, PCR Not Detected    Salmonella, PCR Not Detected    Shigella, PCR Not Detected    Norovirus, PCR Not Detected    Rotavirus A, PCR Not Detected    Giardia lamblia, PCR Not Detected    Cryptosporidium, PCR Not Detected     Comment:     ** Normal Reference Range for each Analyte: Not Detected **         ** The xTAG Gastrointestinal Pathogen Panel results are presumptive and must be confirmed by FDA-cleared tests or other acceptable reference methods.  The results of this test should not be used as the sole basis for diagnosis, treatment, or other patient management decisions.   Performed using the Lum[employer]x xTAG Gastrointestinal Pathogen Panel test kit.   POCT Influenza A/B     Status: Normal   Collection Time: 04/03/17  9:21 AM  Result Value Ref Range   Influenza A, POC Negative Negative   Influenza B, POC Negative Negative    Assessment/Plan: 1. Wheezing CXR today. Lung sound better than last examination. Finish steroid and ABX. Will add on Qvar. Cough medication given. Supportive measures reviewed. Will alter regimen based on x-ray results. ER if any worsening symptoms. - DG Chest 2 View; Future - HYDROcodone-homatropine (HYCODAN) 5-1.5 MG/5ML syrup; Take 5 mLs by mouth every 8 (eight) hours as needed for cough.  Dispense: 120 mL; Refill: 0 - beclomethasone (QVAR) 40 MCG/ACT inhaler; Inhale 2 puffs into the lungs 2 (two) times daily.  Dispense: 1 Inhaler; Refill: 0   Leeanne Rio, [place]

## 2017-04-09 NOTE — Patient Instructions (Signed)
Finish course of steroid. Start the Qvar inhaler twice daily. Continue your albuterol as directed.  Stay hydrated and well-rested.  Speak with Marylene LandAngela up front for directions to the [facility] office for x-ray. I will call you with your results.   We will alter regimen based on x-ray results.  If there are any worsening symptoms, please go to the ER.

## 2017-04-10 ENCOUNTER — Ambulatory Visit: Payer: BLUE CROSS/BLUE SHIELD | Admitting: Physician Assistant

## 2017-04-15 ENCOUNTER — Telehealth: Payer: Self-pay | Admitting: Podiatry

## 2017-04-15 MED ORDER — HYDROCODONE-ACETAMINOPHEN 5-325 MG PO TABS: ORAL_TABLET | ORAL | 0 refills | Status: DC

## 2017-04-15 NOTE — Telephone Encounter (Signed)
She can get one more prescription for vicodin then will refer to pain management for here on out.

## 2017-04-15 NOTE — Telephone Encounter (Addendum)
Routed pain medication request to Dr. Ardelle AntonWagoner. Left message informing pt that Dr.Wagoner would refill the Norco this last time and future pain medication related orders would come from Pain Management, and the rx could be picked up in the [place] office.

## 2017-04-15 NOTE — Telephone Encounter (Signed)
Patient called saying she has an appointment next week with the pain clinic but that she will run out of medication before her appointment. Wants to know if we could write a prescription to last her until her appointment at the pain clinic.

## 2017-04-16 ENCOUNTER — Telehealth: Payer: Self-pay | Admitting: Family Medicine

## 2017-04-16 NOTE — Telephone Encounter (Signed)
Dr. Beverely Lowabori denied this, since we have never seen the patient she told me to route to you to see how to proceed?

## 2017-04-16 NOTE — Telephone Encounter (Signed)
Would recommend wean down to something like Tessalon as I do not want to use codeine-containing cough syrups for a prolonged time frame, especially giving she already has Rx for regular hydrocodone. I am happy to send in Rx for Tessalon or Promethazine for her to use.

## 2017-04-16 NOTE — Telephone Encounter (Signed)
Please advise pt saw cody

## 2017-04-16 NOTE — Telephone Encounter (Signed)
Pt asking for Tussionex cough medicine, pt states that this has worked for her in the past and that this works better that the HYDROcodone.

## 2017-04-16 NOTE — Telephone Encounter (Signed)
Spoke with pt, she still sounds like she is congested. Pt said that she will run out of the hycodan tomorrow. She was given pt is asking if you would refill this for her for the weekend?

## 2017-04-16 NOTE — Telephone Encounter (Signed)
Denied from me as well as would be to early to fill an additional cough medication.  Would recommend over-the-counter Delsym.

## 2017-04-16 NOTE — Telephone Encounter (Signed)
Seeing as how pt has both hydrocodone pills and syrup, I am not willing to give another prescription that contains pain medication

## 2017-04-17 ENCOUNTER — Ambulatory Visit (AMBULATORY_SURGERY_CENTER): Payer: BLUE CROSS/BLUE SHIELD | Admitting: Gastroenterology

## 2017-04-17 ENCOUNTER — Encounter: Payer: Self-pay | Admitting: Gastroenterology

## 2017-04-17 VITALS — BP 108/75 | HR 76 | Temp 98.7°F | Resp 20 | Ht 67.0 in | Wt 226.0 lb

## 2017-04-17 DIAGNOSIS — K2970 Gastritis, unspecified, without bleeding: Secondary | ICD-10-CM

## 2017-04-17 DIAGNOSIS — R1310 Dysphagia, unspecified: Secondary | ICD-10-CM

## 2017-04-17 DIAGNOSIS — R140 Abdominal distension (gaseous): Secondary | ICD-10-CM

## 2017-04-17 DIAGNOSIS — K2950 Unspecified chronic gastritis without bleeding: Secondary | ICD-10-CM | POA: Diagnosis not present

## 2017-04-17 DIAGNOSIS — R1013 Epigastric pain: Secondary | ICD-10-CM | POA: Diagnosis not present

## 2017-04-17 MED ORDER — SODIUM CHLORIDE 0.9 % IV SOLN: 500.0000 mL | INTRAVENOUS | Status: DC

## 2017-04-17 NOTE — Telephone Encounter (Signed)
States she has promethazine at home and will take it.

## 2017-04-17 NOTE — Progress Notes (Signed)
A and O x3. Report to RN. Tolerated MAC anesthesia well.Teeth unchanged after procedure.

## 2017-04-17 NOTE — Op Note (Signed)
[facility] Patient Name: Diane [name] Procedure Date: 04/17/2017 1:51 PM MRN: [id number] Endoscopist: Meryl Dare , MD Age: 36 Referring MD:  Date of Birth: [birth year] Gender: Female Account #: [id number] Procedure:                Upper GI endoscopy Indications:              Epigastric abdominal pain, Dysphagia, Abdominal                            bloating Medicines:                Monitored Anesthesia Care Procedure:                Pre-Anesthesia Assessment:                           - Prior to the procedure, a History and Physical                            was performed, and patient medications and                            allergies were reviewed. The patient's tolerance of                            previous anesthesia was also reviewed. The risks                            and benefits of the procedure and the sedation                            options and risks were discussed with the patient.                            All questions were answered, and informed consent                            was obtained. Prior Anticoagulants: The patient has                            taken no previous anticoagulant or antiplatelet                            agents. ASA Grade Assessment: II - A patient with                            mild systemic disease. After reviewing the risks                            and benefits, the patient was deemed in                            satisfactory condition to undergo the procedure.  After obtaining informed consent, the endoscope was                            passed under direct vision. Throughout the                            procedure, the patient's blood pressure, pulse, and                            oxygen saturations were monitored continuously. The                            Model GIF-HQ190 [id number](SN#[id number]) scope was introduced                            through the mouth, and advanced to the second  part                            of duodenum. The upper GI endoscopy was                            accomplished without difficulty. The patient                            tolerated the procedure well. Scope In: Scope Out: Findings:                 No endoscopic abnormality was evident in the                            esophagus to explain the patient's complaint of                            dysphagia. It was decided, however, to proceed with                            dilation of the entire esophagus. A guidewire was                            placed and the scope was withdrawn. Dilation was                            performed with a Savary dilator with no resistance                            at 16 mm.                           Localized mild inflammation characterized by                            erythema was found on the lesser curvature of the  stomach. Biopsies were taken with a cold forceps                            for histology.                           The exam of the stomach was otherwise normal.                           The duodenal bulb and second portion of the                            duodenum were normal. Complications:            No immediate complications. Estimated Blood Loss:     Estimated blood loss was minimal. Impression:               - No endoscopic esophageal abnormality to explain                            patient's dysphagia. Esophagus dilated. Dilated.                           - Gastritis. Biopsied.                           - Normal duodenal bulb and second portion of the                            duodenum. Recommendation:           - Patient has a contact number available for                            emergencies. The signs and symptoms of potential                            delayed complications were discussed with the                            patient. Return to normal activities tomorrow.                             Written discharge instructions were provided to the                            patient.                           - Clear liquid diet for 2 hours, then advance as                            tolerated to soft diet today. Resume prior diet                            tomorrow.                           -  Continue present medications.                           - Gas-X OTC qid prn bloating                           - FDgard OTC 1-2 tid prn abdominal pain/bloating.                           - If dysphagia persists schedule MBSS.                           - Await pathology results. Meryl Dare, MD 04/17/2017 2:14:38 PM This report has been signed electronically.

## 2017-04-17 NOTE — Telephone Encounter (Signed)
Patient notified of PCP recommendations and is agreement and expresses an understanding.  

## 2017-04-17 NOTE — Patient Instructions (Signed)
Impression/Recommendations:  Gastritis handout given to patient. Dilation diet handout given to patient.  Clear liquid diet for 2 hours, then advance as tolerated to soft diet today.   Resume prior diet tomorrow.  Continue present medications. Gas-X 4 daily as needed for bloating. FD Gard OTC 1-2 as needed for abdominal pain and bloating.  If difficulty swallowing persists, schedule MBSS.  Await pathology results.  YOU HAD AN ENDOSCOPIC PROCEDURE TODAY AT THE [facility]:   Refer to the procedure report that was given to you for any specific questions about what was found during the examination.  If the procedure report does not answer your questions, please call your gastroenterologist to clarify.  If you requested that your care partner not be given the details of your procedure findings, then the procedure report has been included in a sealed envelope for you to review at your convenience later.  YOU SHOULD EXPECT: Some feelings of bloating in the abdomen. Passage of more gas than usual.  Walking can help get rid of the air that was put into your GI tract during the procedure and reduce the bloating. If you had a lower endoscopy (such as a colonoscopy or flexible sigmoidoscopy) you may notice spotting of blood in your stool or on the toilet paper. If you underwent a bowel prep for your procedure, you may not have a normal bowel movement for a few days.  Please Note:  You might notice some irritation and congestion in your nose or some drainage.  This is from the oxygen used during your procedure.  There is no need for concern and it should clear up in a day or so.  SYMPTOMS TO REPORT IMMEDIATELY:   Following upper endoscopy (EGD)  Vomiting of blood or coffee ground material  New chest pain or pain under the shoulder blades  Painful or persistently difficult swallowing  New shortness of breath  Fever of 100F or higher  Black, tarry-looking stools  For urgent or  emergent issues, a gastroenterologist can be reached at any hour by calling [phone].   DIET:  We do recommend a small meal at first, but then you may proceed to your regular diet.  Drink plenty of fluids but you should avoid alcoholic beverages for 24 hours.  ACTIVITY:  You should plan to take it easy for the rest of today and you should NOT DRIVE or use heavy machinery until tomorrow (because of the sedation medicines used during the test).    FOLLOW UP: Our staff will call the number listed on your records the next business day following your procedure to check on you and address any questions or concerns that you may have regarding the information given to you following your procedure. If we do not reach you, we will leave a message.  However, if you are feeling well and you are not experiencing any problems, there is no need to return our call.  We will assume that you have returned to your regular daily activities without incident.  If any biopsies were taken you will be contacted by phone or by letter within the next 1-3 weeks.  Please call us at [phone] if you have not heard about the biopsies in 3 weeks.    SIGNATURES/CONFIDENTIALITY: You and/or your care partner have signed paperwork which will be entered into your electronic medical record.  These signatures attest to the fact that that the information above on your After Visit Summary has been reviewed and is understood.  Full responsibility of the confidentiality of this discharge information lies with you and/or your care-partner.

## 2017-04-17 NOTE — Progress Notes (Signed)
Called to room to assist during endoscopic procedure.  Patient ID and intended procedure confirmed with present staff. Received instructions for my participation in the procedure from the performing physician.  

## 2017-04-20 ENCOUNTER — Telehealth: Payer: Self-pay | Admitting: *Deleted

## 2017-04-20 NOTE — Telephone Encounter (Signed)
  Follow up Call-  Call back number 04/17/2017 01/04/2016  Post procedure Call Back phone  # [phone]  Permission to leave phone message Yes Yes  Some recent data might be hidden     Patient questions:  Do you have a fever, pain , or abdominal swelling? No. Pain Score  0 *  Have you tolerated food without any problems? Yes.    Have you been able to return to your normal activities? Yes.    Do you have any questions about your discharge instructions: Diet   No. Medications  No. Follow up visit  No.  Do you have questions or concerns about your Care? No.  Actions: * If pain score is 4 or above: No action needed, pain <4.

## 2017-04-21 ENCOUNTER — Telehealth: Payer: Self-pay | Admitting: Physician Assistant

## 2017-04-21 ENCOUNTER — Ambulatory Visit (INDEPENDENT_AMBULATORY_CARE_PROVIDER_SITE_OTHER): Payer: BLUE CROSS/BLUE SHIELD | Admitting: Physician Assistant

## 2017-04-21 ENCOUNTER — Encounter: Payer: Self-pay | Admitting: Physician Assistant

## 2017-04-21 VITALS — BP 120/80 | HR 101 | Temp 98.9°F | Ht 67.0 in | Wt 223.4 lb

## 2017-04-21 DIAGNOSIS — H1032 Unspecified acute conjunctivitis, left eye: Secondary | ICD-10-CM | POA: Diagnosis not present

## 2017-04-21 DIAGNOSIS — J029 Acute pharyngitis, unspecified: Secondary | ICD-10-CM | POA: Diagnosis not present

## 2017-04-21 DIAGNOSIS — R509 Fever, unspecified: Secondary | ICD-10-CM

## 2017-04-21 DIAGNOSIS — J209 Acute bronchitis, unspecified: Secondary | ICD-10-CM

## 2017-04-21 LAB — COMPREHENSIVE METABOLIC PANEL
ALK PHOS: 106 U/L (ref 39–117)
ALT: 39 U/L — AB (ref 0–35)
AST: 28 U/L (ref 0–37)
Albumin: 4.4 g/dL (ref 3.5–5.2)
BILIRUBIN TOTAL: 0.3 mg/dL (ref 0.2–1.2)
BUN: 13 mg/dL (ref 6–23)
CO2: 25 meq/L (ref 19–32)
CREATININE: 0.67 mg/dL (ref 0.40–1.20)
Calcium: 10 mg/dL (ref 8.4–10.5)
Chloride: 102 mEq/L (ref 96–112)
GFR: 106.67 mL/min (ref >60.00)
Glucose, Bld: 108 mg/dL — ABNORMAL HIGH (ref 70–99)
Potassium: 4 mEq/L (ref 3.5–5.1)
Sodium: 135 mEq/L (ref 135–145)
TOTAL PROTEIN: 8 g/dL (ref 6.0–8.3)

## 2017-04-21 LAB — CBC WITH DIFFERENTIAL/PLATELET
BASOS ABS: 0.1 10*3/uL (ref 0.0–0.1)
Basophils Relative: 0.7 % (ref 0.0–3.0)
EOS ABS: 0.6 10*3/uL (ref 0.0–0.7)
Eosinophils Relative: 7.3 % — ABNORMAL HIGH (ref 0.0–5.0)
HCT: 39.8 % (ref 36.0–46.0)
Hemoglobin: 13.5 g/dL (ref 12.0–15.0)
LYMPHS ABS: 2 10*3/uL (ref 0.7–4.0)
Lymphocytes Relative: 22.5 % (ref 12.0–46.0)
MCHC: 34 g/dL (ref 30.0–36.0)
MCV: 85.9 fl (ref 78.0–100.0)
MONO ABS: 0.5 10*3/uL (ref 0.1–1.0)
MONOS PCT: 5.8 % (ref 3.0–12.0)
NEUTROS ABS: 5.6 10*3/uL (ref 1.4–7.7)
NEUTROS PCT: 63.7 % (ref 43.0–77.0)
Platelets: 302 10*3/uL (ref 150.0–400.0)
RBC: 4.63 Mil/uL (ref 3.87–5.11)
RDW: 13.5 % (ref 11.5–15.5)
WBC: 8.8 10*3/uL (ref 4.0–10.5)

## 2017-04-21 LAB — POCT RAPID STREP A (OFFICE): RAPID STREP A SCREEN: NEGATIVE

## 2017-04-21 MED ORDER — ERYTHROMYCIN 5 MG/GM OP OINT: TOPICAL_OINTMENT | OPHTHALMIC | 0 refills | Status: DC

## 2017-04-21 MED ORDER — PROMETHAZINE-DM 6.25-15 MG/5ML PO SYRP: 2.5000 mL | ORAL_SOLUTION | Freq: Four times a day (QID) | ORAL | 0 refills | Status: DC | PRN

## 2017-04-21 MED ORDER — FIRST-DUKES MOUTHWASH MT SUSP: 5.0000 mL | Freq: Three times a day (TID) | OROMUCOSAL | 1 refills | Status: DC | PRN

## 2017-04-21 MED ORDER — METHYLPREDNISOLONE ACETATE 40 MG/ML IJ SUSP
40.0000 mg | Freq: Once | INTRAMUSCULAR | Status: AC
  Administered 2017-04-21: 40 mg via INTRAMUSCULAR

## 2017-04-21 NOTE — Telephone Encounter (Signed)
Patient called requesting her referral for ENT go to [facility] instead of Dr. Ezzard StandingNewman. I advised Etta GrandchildMarkie who let me know that [facility] may take longer however, the patient insisted that she wanted [facility] and that their office said they can get her in around the time of her daughters appointment. Etta GrandchildMarkie is changing the referral to [facility] per the patient's request.

## 2017-04-21 NOTE — Progress Notes (Signed)
Diane [name] is a 36 y.o. female here for Sore throat  I acted as a Neurosurgeon for [employer], PA-C Corky Mull, LPN  History of Present Illness:   Chief Complaint  Patient presents with  . Sore Throat  . Cough    Sore Throat   This is a new problem. Episode onset: x 3 weeks. The problem has been gradually worsening. Neither side of throat is experiencing more pain than the other. The maximum temperature recorded prior to her arrival was 100.4 - 100.9 F. The fever has been present for 1 to 2 days. The pain is at a severity of 7/10. The pain is moderate. Associated symptoms include abdominal pain, congestion, coughing, diarrhea, headaches, a hoarse voice, a plugged ear sensation, neck pain, shortness of breath and trouble swallowing. Pertinent negatives include no ear discharge, ear pain or vomiting. Exposure to: daughter checked for strep one week ago and was negative. Treatments tried: inhaler, two antibiotics, and prednisone and tessolon capsules. The treatment provided no relief (only thing that has worked for cough is Tussinex ).  Cough  This is a recurrent problem. Episode onset: x 3 weeks. The problem has been gradually worsening. The problem occurs constantly. The cough is productive of sputum (expectorating green sputum). Associated symptoms include a fever, headaches, a sore throat and shortness of breath. Pertinent negatives include no ear pain. The symptoms are aggravated by lying down. She has tried prescription cough suppressant and steroid inhaler for the symptoms. The treatment provided mild relief.   Coughing throughout the day and night. Over the past 3 weeks she has tried: oral steroids, albuterol inhaler, qvar, nebulizer treatments, Hycodan,  Doxycycline. She now has worsening sore throat and has been around her children with similar symptoms, however they were both tested negative for strep. Her appetite is poor.   CXR on 04/09/17 --> IMPRESSION: Mild right basilar  subsegmental atelectasis. Exam otherwise unremarkable.  She also endorses L eye crusting and discharge. No blurred vision. She does occasionally wear contacts. Daughter with similar symptoms.  PMHx, SurgHx, SocialHx, Medications, and Allergies were reviewed in the Visit Navigator and updated as appropriate.  Current Medications:   Current Outpatient Prescriptions:  .  albuterol (PROVENTIL HFA;VENTOLIN HFA) 108 (90 Base) MCG/ACT inhaler, Inhale into the lungs as needed for wheezing or shortness of breath., Disp: , Rfl:  .  beclomethasone (QVAR) 40 MCG/ACT inhaler, Inhale 2 puffs into the lungs 2 (two) times daily., Disp: 1 Inhaler, Rfl: 0 .  diazepam (VALIUM) 5 MG tablet, Take 1 tablet (5 mg total) by mouth every 12 (twelve) hours as needed for anxiety., Disp: 60 tablet, Rfl: 5 .  dicyclomine (BENTYL) 20 MG tablet, Take 1 tablet (20 mg total) by mouth 3 (three) times daily before meals., Disp: 90 tablet, Rfl: 2 .  gabapentin (NEURONTIN) 300 MG capsule, Take 2 capsules (600 mg total) by mouth 3 (three) times daily. (Patient taking differently: Take 600-900 mg by mouth 2 (two) times daily. Take 2 capsules (600 mg) in the morning and Take 4 capsules (900 mg) in the evening.), Disp: 180 capsule, Rfl: 5 .  HYDROcodone-acetaminophen (NORCO/VICODIN) 5-325 MG tablet, Take one tablet every 4-6 hours prn foot pain., Disp: 30 tablet, Rfl: 0 .  Magnesium 400 MG TABS, Take 1,200 mg by mouth daily. , Disp: , Rfl:  .  oxybutynin (DITROPAN-XL) 10 MG 24 hr tablet, TAKE ONE TABLET BY MOUTH AT BEDTIME, Disp: 30 tablet, Rfl: 5 .  RABEprazole (ACIPHEX) 20 MG tablet, Take 1  tablet (20 mg total) by mouth 2 (two) times daily., Disp: 60 tablet, Rfl: 3 .  sucralfate (CARAFATE) 1 g tablet, Take 1 tablet (1 g total) by mouth 4 (four) times daily -  with meals and at bedtime., Disp: 120 tablet, Rfl: 0 .  tiZANidine (ZANAFLEX) 4 MG tablet, Take 4 mg by mouth every 6 (six) hours as needed for muscle spasms. , Disp: , Rfl:  .   venlafaxine XR (EFFEXOR-XR) 150 MG 24 hr capsule, Take 2 capsules (300 mg total) by mouth daily before breakfast., Disp: 60 capsule, Rfl: 11 .  cetirizine (ZYRTEC) 10 MG tablet, Take 10 mg by mouth daily., Disp: , Rfl:  .  Diphenhyd-Hydrocort-Nystatin (FIRST-DUKES MOUTHWASH) SUSP, Use as directed 5 mLs in the mouth or throat 3 (three) times daily as needed., Disp: 237 mL, Rfl: 1 .  erythromycin (ROMYCIN) ophthalmic ointment, Apply thin ribbon to affected eye(s) once daily for 7 days., Disp: 1 g, Rfl: 0 .  promethazine-dextromethorphan (PROMETHAZINE-DM) 6.25-15 MG/5ML syrup, Take 2.5 mLs by mouth 4 (four) times daily as needed for cough., Disp: 118 mL, Rfl: 0  Current Facility-Administered Medications:  .  0.9 %  sodium chloride infusion, 500 mL, Intravenous, Continuous, Claudette Head T, MD .  triamcinolone acetonide (KENALOG) 10 MG/ML injection 10 mg, 10 mg, Other, Once, Asencion Islam, DPM   Review of Systems:   Review of Systems  Constitutional: Positive for fever.  HENT: Positive for congestion, hoarse voice, sore throat and trouble swallowing. Negative for ear discharge and ear pain.   Respiratory: Positive for cough and shortness of breath.   Gastrointestinal: Positive for abdominal pain and diarrhea. Negative for vomiting.  Musculoskeletal: Positive for neck pain.  Neurological: Positive for headaches.    Vitals:   Vitals:   04/21/17 1112  BP: 120/80  Pulse: (!) 101  Temp: 98.9 F (37.2 C)  TempSrc: Oral  SpO2: 95%  Weight: 223 lb 6.1 oz (101.3 kg)  Height: 5\' 7"  (1.702 m)     Body mass index is 34.99 kg/m.  Physical Exam:   Physical Exam  Constitutional: She appears well-developed. She is cooperative.  Non-toxic appearance. She does not have a sickly appearance. She does not appear ill. No distress.  HENT:  Head: Normocephalic and atraumatic.  Right Ear: Tympanic membrane, external ear and ear canal normal. Tympanic membrane is not erythematous, not retracted and  not bulging.  Left Ear: Tympanic membrane, external ear and ear canal normal. Tympanic membrane is not erythematous, not retracted and not bulging.  Nose: Nose normal. Right sinus exhibits no maxillary sinus tenderness and no frontal sinus tenderness. Left sinus exhibits no maxillary sinus tenderness and no frontal sinus tenderness.  Mouth/Throat: Uvula is midline. Mucous membranes are pale. Posterior oropharyngeal erythema present. No posterior oropharyngeal edema. Tonsils are 0 on the right. Tonsils are 0 on the left.  Tonsils absent  Eyes: Lids are normal. Left eye exhibits no discharge. Left conjunctiva is injected.  Neck: Trachea normal.  Cardiovascular: Normal rate, regular rhythm, S1 normal, S2 normal and normal heart sounds.   Pulmonary/Chest: Effort normal and breath sounds normal. She has no decreased breath sounds. She has no wheezes. She has no rhonchi. She has no rales.  Lymphadenopathy:    She has no cervical adenopathy.  Neurological: She is alert.  Skin: Skin is warm, dry and intact.  Psychiatric: She has a normal mood and affect. Her speech is normal and behavior is normal.  Nursing note and vitals reviewed.    Results  for orders placed or performed in visit on 04/21/17  Comprehensive metabolic panel  Result Value Ref Range   Sodium 135 135 - 145 mEq/L   Potassium 4.0 3.5 - 5.1 mEq/L   Chloride 102 96 - 112 mEq/L   CO2 25 19 - 32 mEq/L   Glucose, Bld 108 (H) 70 - 99 mg/dL   BUN 13 6 - 23 mg/dL   Creatinine, Ser 1.61 0.40 - 1.20 mg/dL   Total Bilirubin 0.3 0.2 - 1.2 mg/dL   Alkaline Phosphatase 106 39 - 117 U/L   AST 28 0 - 37 U/L   ALT 39 (H) 0 - 35 U/L   Total Protein 8.0 6.0 - 8.3 g/dL   Albumin 4.4 3.5 - 5.2 g/dL   Calcium 09.6 8.4 - 04.5 mg/dL   GFR 409.81 >19.14 mL/min  CBC with Differential/Platelet  Result Value Ref Range   WBC 8.8 4.0 - 10.5 K/uL   RBC 4.63 3.87 - 5.11 Mil/uL   Hemoglobin 13.5 12.0 - 15.0 g/dL   HCT 78.2 95.6 - 21.3 %   MCV 85.9 78.0  - 100.0 fl   MCHC 34.0 30.0 - 36.0 g/dL   RDW 08.6 57.8 - 46.9 %   Platelets 302.0 150.0 - 400.0 K/uL   Neutrophils Relative % 63.7 43.0 - 77.0 %   Lymphocytes Relative 22.5 12.0 - 46.0 %   Monocytes Relative 5.8 3.0 - 12.0 %   Eosinophils Relative 7.3 (H) 0.0 - 5.0 %   Basophils Relative 0.7 0.0 - 3.0 %   Neutro Abs 5.6 1.4 - 7.7 K/uL   Lymphs Abs 2.0 0.7 - 4.0 K/uL   Monocytes Absolute 0.5 0.1 - 1.0 K/uL   Eosinophils Absolute 0.6 0.0 - 0.7 K/uL   Basophils Absolute 0.1 0.0 - 0.1 K/uL  POCT rapid strep A  Result Value Ref Range   Rapid Strep A Screen Negative Negative      Assessment and Plan:    Desirie was seen today for sore throat and cough.  Diagnoses and all orders for this visit:  Sore throat Rapid strep test negative. Will refer to ENT as she has had chronic sore throat x 3 weeks with no response to multiple treatment modalities. Did provide 40 mg depo-medrol injection. Advised patient to follow-up with PCP if worsening issues with fever, difficulties opening/closing mouth, or severe-one sided pain. Provided magic mouthwash prescription. -     POCT rapid strep A -     Ambulatory referral to ENT  Fever, unspecified fever cause Labs unremarkable. Discussed use to alternating tylenol and ibuprofen, continue aciphex and carafate to protect stomach. -     POCT rapid strep A -     Comprehensive metabolic panel -     CBC with Differential/Platelet  Acute bronchitis with bronchospasm Lung exam benign today. CMP and CBC unremarkable. Per chart review, patient is to not receive pain medications so I will not refill her Tussionex. I have given her a refill of Promethazine DM. I advised for her to follow up with PCP if any concerns with breathing or wheezing. -     Comprehensive metabolic panel -     CBC with Differential/Platelet -     methylPREDNISolone acetate (DEPO-MEDROL) injection 40 mg; Inject 1 mL (40 mg total) into the muscle once.  Acute bacterial  conjunctivitis Treat with erythromycin ointment per orders. Avoid further contact use until symptoms resolve. Follow-up with PCP if symptoms persist or worsen despite treatment.   Other orders -  promethazine-dextromethorphan (PROMETHAZINE-DM) 6.25-15 MG/5ML syrup; Take 2.5 mLs by mouth 4 (four) times daily as needed for cough. -     erythromycin [facility](ROMYCIN) ophthalmic ointment; Apply thin ribbon to affected eye(s) once daily for 7 days. -     Diphenhyd-Hydrocort-Nystatin (FIRST-DUKES MOUTHWASH) SUSP; Use as directed 5 mLs in the mouth or throat 3 (three) times daily as needed.    . Reviewed expectations re: course of current medical issues. . Discussed self-management of symptoms. . Outlined signs and symptoms indicating need for more acute intervention. . Patient verbalized understanding and all questions were answered. . See orders for this visit as documented in the electronic medical record. . Patient received an After-Visit Summary.  CMA or LPN served as scribe during this visit. History, Physical, and Plan performed by medical provider. Documentation and orders reviewed and attested to.  Jarold MottoSamantha Kandiss Ihrig, PA-C

## 2017-04-21 NOTE — Patient Instructions (Signed)
Please go to the lab to get your labwork completed. We will call you with your lab results.  If you develop worsening shortness of breath, difficulty of breathing, fever please seek medical attention.

## 2017-04-24 ENCOUNTER — Telehealth: Payer: Self-pay | Admitting: Physician Assistant

## 2017-04-24 NOTE — Telephone Encounter (Signed)
Confirmed cell and pharmacy   Patient states that her cough is severe and that she can hardly swallow. She states that she is in need of something like tussinex. She called pain management and they told her that if she gets a note, then she can get it. [facility] she saw samantha worley for cough. Advised that most likely we would need to defer to dr tabori. Routing to both parties.

## 2017-04-24 NOTE — Telephone Encounter (Signed)
I am not comfortable prescribing this, as her chart has a note in the specialty comments that states "DO NOT PRESCRIBE PAIN MEDS OR ANTIANXIETY MEDS". I am going to defer to PCP.

## 2017-04-24 NOTE — Telephone Encounter (Signed)
Message  Received: Burgess EstelleYesterday 04/23/2017 Message Contents  Diane BarrackWagoner, Matthew R, DPM  Alphia Kava'Connell, Greysyn Vanderberg D, RN        She is not to get pain medication from us. Per the drug prescriber info we got she was given vicodin after we gave her tramadol. She is getting it from multiple provides it looks like.

## 2017-04-24 NOTE — Telephone Encounter (Signed)
Called patient and explained that we would not be prescribing any additional cough syrup and advised her to call her ENT. She has already and is awaiting a call back from them.

## 2017-04-24 NOTE — Telephone Encounter (Signed)
Pt just got Promethazine cough syrup 3 days ago.  I am not going to give Tussionex cough syrup (as this is not a medication a regularly prescribe).  Pt has hydrocodone pills from pain management and I am not going to duplicate hydrocodone.  If pt continues to call asking for controlled substances, I will not be able to see her as a pt.

## 2017-04-27 ENCOUNTER — Ambulatory Visit (INDEPENDENT_AMBULATORY_CARE_PROVIDER_SITE_OTHER): Payer: BLUE CROSS/BLUE SHIELD | Admitting: Podiatry

## 2017-04-27 ENCOUNTER — Telehealth: Payer: Self-pay | Admitting: *Deleted

## 2017-04-27 DIAGNOSIS — S96911A Strain of unspecified muscle and tendon at ankle and foot level, right foot, initial encounter: Secondary | ICD-10-CM | POA: Diagnosis not present

## 2017-04-27 DIAGNOSIS — M722 Plantar fascial fibromatosis: Secondary | ICD-10-CM | POA: Diagnosis not present

## 2017-04-27 DIAGNOSIS — S82891S Other fracture of right lower leg, sequela: Secondary | ICD-10-CM

## 2017-04-27 DIAGNOSIS — T148XXA Other injury of unspecified body region, initial encounter: Secondary | ICD-10-CM

## 2017-04-27 DIAGNOSIS — M899 Disorder of bone, unspecified: Secondary | ICD-10-CM

## 2017-04-27 DIAGNOSIS — M949 Disorder of cartilage, unspecified: Secondary | ICD-10-CM

## 2017-04-27 NOTE — Telephone Encounter (Addendum)
-----   Message from Vivi BarrackMatthew R Wagoner, DPM sent at 04/27/2017  1:56 PM EDT ----- Can you please order an MRI of the right ankle to rule out posterior tibial tendon tear/peroneal tear. Thanks. Orders given to [facility]. [facility] and [facility]ed to [facility].05/25/2017-Pt states she is having MRI of back 05/28/2017 and would like to have the MRI Right ankle at [facility] [address] [place], [place], [phone]. I spoke with Faith Rogueiffany - [facility] [place] for NPI# [id number].Cala BradfordKimberly - [employer] of [place] changed to [facility] and stated inform the imaging center it was changed to [address] I informed Tiffany - [facility] that the location had been switched to [address] Location and she stated she could change to the [address] once the orders had been received at [phone]. Faxed orders to [facility] [place].06/04/2017-Scheduled [facility] with [facility]-Dr. Lucita FerraraAaron Scott - Danielle, appt 07/17/2017 at 1:00pm arrive 12:45pm, pt to bring imaging disc to appt. Faxed referral, clinicals and demographics to [facility] Attn: Dr. Lucita FerraraAaron Scott. I informed pt of appt time and to pick up copies of the imaging disc. Ordered copies of imagings from [facility] ArchJ. Smith our office, [facility] [place], and [facility] to be sent to our office and I will give to Dr. Ardelle AntonWagoner to take to pt in [facility].06/11/2017-Left message informing pt the imaging disc from all 3 locations were available for pick up in the [place] office or the [facility] next Tuesday, depending on when she was scheduled with [facility].

## 2017-04-29 NOTE — Progress Notes (Signed)
Subjective: 36 year old female presents the office they for follow-up evaluation of right ankle pain and left heel pain. The left side is doing well. However she states that she continues to get quite a bit of pain on the right ankle on a daily basis. She has been going to pain management. She states that she was wearing the brace and the right side but states that it hurts. She did discuss this with work as well. She points the medial as well as the lateral aspect of the ankle where she gets pain.Denies any recent injury or trauma. Denies any swelling or redness. Denies any systemic complaints such as fevers, chills, nausea, vomiting. No acute changes since last appointment, and no other complaints at this time.   She does have an MRI scheduled for her back as pain management with some of her symptoms may be coming from back issues.  Objective: AAO x3, NAD DP/PT pulses palpable bilaterally, CRT less than 3 seconds On the right ankle there is tenderness palpation of the course of posterior tibial tendon just posterior and inferior to the medial malleolus along the insertion into the navicular tuberosity. There is tenderness but to a lesser degree on the course the peroneal tendon. There is no significant pinpoint bony tenderness there is no pain with ankle joint range of motion in the tenderness appears to be more from a tendon issue. There is some trace edema to the ankle but there is no erythema or increase in warmth. The left foot there is minimal discomfort on the plantar medial tubercle of the calcaneus at the insertion the plantar fascia. No area pinpoint bony tenderness. No open lesions or pre-ulcerative lesions.  No pain with calf compression, swelling, warmth, erythema  Assessment: Rule out tendon tear right ankle; improved left plantar fasciitis pain  Plan: -All treatment options discussed with the patient including all alternatives, risks, complications.  -at this point given her  prolonged nature symptoms the right ankle are recommended a repeat MRI of the ankle which is ordered today. I'm getting the MRI to rule out a tendon tear. She's had prolonged conservative treatment without any improvement. -Left side is doing well. Continue with stretching, icing. -Patient encouraged to call the office with any questions, concerns, change in symptoms.   Ovid CurdMatthew Wagoner, DPM

## 2017-04-30 ENCOUNTER — Encounter: Payer: Self-pay | Admitting: Gastroenterology

## 2017-05-07 ENCOUNTER — Ambulatory Visit: Payer: BLUE CROSS/BLUE SHIELD | Admitting: Family Medicine

## 2017-05-12 ENCOUNTER — Telehealth: Payer: Self-pay | Admitting: Gastroenterology

## 2017-05-12 MED ORDER — SUCRALFATE 1 G PO TABS: 1.0000 g | ORAL_TABLET | Freq: Three times a day (TID) | ORAL | 0 refills | Status: DC

## 2017-05-12 NOTE — Telephone Encounter (Signed)
Called patient and someone picked up the line but did not say anything and then hung hung up. Will try to call patient tomorrow.

## 2017-05-13 NOTE — Telephone Encounter (Signed)
Informed patient that I sent the prescription for Carafate to her pharmacy. Patient verbalized understanding.

## 2017-05-25 ENCOUNTER — Telehealth: Payer: Self-pay | Admitting: Podiatry

## 2017-05-25 NOTE — Telephone Encounter (Signed)
I informed pt the MRI had location change and had been faxed to the [facility] [place] and she could call and schedule.

## 2017-05-25 NOTE — Telephone Encounter (Signed)
Hello. I believe I just got done speaking to one of your nurses in the office. I'm calling because Dr. Ardelle AntonWagoner is getting me scheduled for an MRI of my ankle. I actually have an MRI for my back scheduled tomorrow at [facility]. They are requesting the orders be faxed to them and the MRI is taking place at [facility]. If you have any questions or need anything else, please call me back at [phone].

## 2017-05-26 ENCOUNTER — Other Ambulatory Visit: Payer: Self-pay | Admitting: Family Medicine

## 2017-06-02 ENCOUNTER — Ambulatory Visit (INDEPENDENT_AMBULATORY_CARE_PROVIDER_SITE_OTHER): Payer: BLUE CROSS/BLUE SHIELD | Admitting: Podiatry

## 2017-06-02 ENCOUNTER — Encounter: Payer: Self-pay | Admitting: Podiatry

## 2017-06-02 DIAGNOSIS — M899 Disorder of bone, unspecified: Secondary | ICD-10-CM

## 2017-06-02 DIAGNOSIS — S82891S Other fracture of right lower leg, sequela: Secondary | ICD-10-CM

## 2017-06-02 DIAGNOSIS — M949 Disorder of cartilage, unspecified: Secondary | ICD-10-CM | POA: Diagnosis not present

## 2017-06-03 DIAGNOSIS — M949 Disorder of cartilage, unspecified: Secondary | ICD-10-CM

## 2017-06-03 DIAGNOSIS — M899 Disorder of bone, unspecified: Secondary | ICD-10-CM | POA: Insufficient documentation

## 2017-06-03 NOTE — Progress Notes (Signed)
Subjective: [name] presents the also discuss MRI results of the right ankle. She previously sustained a trimalleolar ankle fracture she underwent open reduction internal fixation. Due to an allergy the hardware this was subsequently removed. She was discharged from prior surgeon and she continues have pain to the ankle she presents to me for another opinion. She's having ongoing pain to ankle performed ankle arthroscopy. She had minimal relief from this facility for short not time. She continues to have quite a bit of pain to the ankle. She's been seen by pain management as well. I've also tried a custom ankle brace however she did not tolerate this well. She presents today for follow-up to discuss MRI results. Denies any systemic complaints such as fevers, chills, nausea, vomiting. No acute changes since last appointment, and no other complaints at this time.   Objective: AAO x3, NAD DP/PT pulses palpable bilaterally, CRT less than 3 seconds There is chronic diffuse pain to the right ankle. There is tenderness on the course of the flexor tendons, peroneal tendons and posterior ankle. There is no area specific pinpoint bony tenderness there is no pain vibratory sensation. There is pain with ankle joint range of motion. Minimal discomfort the sinus tarsi. No specific area pinpoint tenderness of the foot. On left side the plantar fasciitis is doing well. No open lesions or pre-ulcerative lesions.  No pain with calf compression, swelling, warmth, erythema  Assessment: Chronic ankle pain right foot status post ankle fracture  Plan: -All treatment options discussed with the patient including all alternatives, risks, complications.  -At this time reviewed the MRI. I have attempted arthroscopy as well as custom brace for any improvement. At this point I recommended another opinion. [facility] Dr. Lucita FerraraAaron Scott for another opinion. I discussed this with her and she is in  agreement for another opinion. Continue to help with pain management as well. -Patient encouraged to call the office with any questions, concerns, change in symptoms.   Ovid CurdMatthew Wagoner, DPM

## 2017-06-04 NOTE — Telephone Encounter (Signed)
Made CD of x-rays and gave to our RN Valery.

## 2017-06-08 ENCOUNTER — Ambulatory Visit: Payer: BLUE CROSS/BLUE SHIELD | Admitting: Adult Health

## 2017-06-16 ENCOUNTER — Telehealth: Payer: Self-pay | Admitting: Podiatry

## 2017-06-16 NOTE — Telephone Encounter (Signed)
I was in the office earlier to pick up records. I'm going to see Dr. Lorin PicketScott. Dr. Ardelle AntonWagoner wanted me to call him to let him know what they said. I have some questions about what was said. I was told they were going to have to fuse my ankle because there is too much cartilage to do a cartilage transplant. If Dr. Ardelle AntonWagoner or a nurse could call me back please. Thank you.

## 2017-06-17 ENCOUNTER — Telehealth: Payer: Self-pay | Admitting: Podiatry

## 2017-06-17 DIAGNOSIS — S82891S Other fracture of right lower leg, sequela: Secondary | ICD-10-CM

## 2017-06-17 DIAGNOSIS — M899 Disorder of bone, unspecified: Secondary | ICD-10-CM

## 2017-06-17 DIAGNOSIS — M949 Disorder of cartilage, unspecified: Secondary | ICD-10-CM

## 2017-06-17 NOTE — Telephone Encounter (Signed)
Pt requested a referral to [facility] for a second opinion as [facility] her she would need her ankle fused.

## 2017-06-17 NOTE — Telephone Encounter (Signed)
[facility]

## 2017-06-17 NOTE — Telephone Encounter (Addendum)
Dr. Ardelle AntonWagoner [facility] - Dr. Bo McclintockJames Deorio. Left message informing pt of referral to [facility] - Dr. Acey Laveorio [phone].

## 2017-06-19 ENCOUNTER — Telehealth: Payer: Self-pay | Admitting: Podiatry

## 2017-06-19 NOTE — Telephone Encounter (Signed)
Diane [name] called me on Wednesday and stated Dr. Ardelle AntonWagoner was referring me for a [name]. I just spoke to [name] office and they wanted to make sure you have the correct fax number.  That fax number is [phone].

## 2017-06-22 NOTE — Telephone Encounter (Signed)
I reviewed fax confirmation and all of the clinicals, demographics were faxed to the number sent by pt.

## 2017-06-23 ENCOUNTER — Other Ambulatory Visit: Payer: Self-pay

## 2017-06-23 MED ORDER — SUCRALFATE 1 G PO TABS: 1.0000 g | ORAL_TABLET | Freq: Three times a day (TID) | ORAL | 0 refills | Status: DC

## 2017-07-08 ENCOUNTER — Other Ambulatory Visit: Payer: Self-pay

## 2017-07-08 ENCOUNTER — Telehealth: Payer: Self-pay | Admitting: Family Medicine

## 2017-07-08 MED ORDER — DICYCLOMINE HCL 20 MG PO TABS: 20.0000 mg | ORAL_TABLET | Freq: Three times a day (TID) | ORAL | 2 refills | Status: DC

## 2017-07-08 NOTE — Telephone Encounter (Signed)
I have never seen this pt and I cannot fill controlled substances w/out seeing her.  Pt cancelled her NP appt in June and will now need to wait until her upcoming appt or be seen for a specific controlled substance visit

## 2017-07-08 NOTE — Telephone Encounter (Signed)
Please advise 

## 2017-07-08 NOTE — Telephone Encounter (Signed)
Please advise, there is no refill request or telephone note requesting this medication from [name]. GI had updated pt chart to make Tabori PCP.

## 2017-07-08 NOTE — Telephone Encounter (Signed)
Pt states that she needs a refill on diazepam, pt has not had her NP appt with [facility], however [facility] is listed as PCP so [name] will not fill for pt. Pt cancelled NP appt in June with [facility] and is rescheduled for Sept. Pt uses [facility].

## 2017-07-09 ENCOUNTER — Encounter: Payer: Self-pay | Admitting: Podiatry

## 2017-07-09 ENCOUNTER — Ambulatory Visit (INDEPENDENT_AMBULATORY_CARE_PROVIDER_SITE_OTHER): Payer: BLUE CROSS/BLUE SHIELD | Admitting: Podiatry

## 2017-07-09 ENCOUNTER — Telehealth: Payer: Self-pay | Admitting: Podiatry

## 2017-07-09 ENCOUNTER — Ambulatory Visit (INDEPENDENT_AMBULATORY_CARE_PROVIDER_SITE_OTHER): Payer: BLUE CROSS/BLUE SHIELD

## 2017-07-09 DIAGNOSIS — S93491A Sprain of other ligament of right ankle, initial encounter: Secondary | ICD-10-CM | POA: Diagnosis not present

## 2017-07-09 DIAGNOSIS — Z9889 Other specified postprocedural states: Secondary | ICD-10-CM

## 2017-07-09 NOTE — Telephone Encounter (Signed)
I don't mind seeing her today- If she cannot get in to see me then please schedule her with someone to get an x-ray. Have her stay in the CAM boot.

## 2017-07-09 NOTE — Telephone Encounter (Signed)
Dr. Ardelle Anton has referred me out for a second opinion on my ankle and they cannot see me until Monday. Well yesterday I slid out of my husbands truck and landed wrong on my ankle the wrong way. It hurts to even try to put pressure on the right side. I wanted to know what Dr. Ardelle Anton thinks I should do, could I have caused more injury. All I know is that I cannot put any pressure on the outside of my right ankle. If you could call me back at [phone]. Thank you and have a great day.

## 2017-07-09 NOTE — Progress Notes (Signed)
This patient presents the office stating that she has sprained her right ankle on her stairs.  She says that her ankle has become extremely painful and she has difficulty placing weight on her right foot.  She says she experiences sharp, radiating pain through her ankle. During gait  . She has had a history of ankle pathology treated with previous surgery.  She has presently been undergoing secondary consults for an evaluation of her right ankle. Per Dr. Ardelle Anton.  She is scheduled for a second opinion at [facility] this coming Monday and presents the office today for an evaluation of her right foot and ankle due to the pain she is now experiencing. A summary of her history can be found in the notes on 06/02/2017.   Neurovascular status can be found in 06/02/17.  Patient has increased temperature and mild swelling noted in her right ankle.  No evidence of ecchymosis noted at this ankle.  There is pain noted at the level of the lateral malleolus and the medial malleolus right ankle.  Ankle sprain right foot.   ROV.  X-rays taken of her right ankle and revealed no evidence of any changes from the previously taken x-rays.  There is no evidence of any fracture or displacement of the tibia  and fibula.  Dr. Ardelle Anton examine these x-rays and confirmed there was no bony pathology from her ankle sprain.  I chose to apply an Unna boot to help to support her right ankle.  She is to leave the [employer] on and walk in her newly dispensed a cam walker.  Patient should recontact Dr. Ardelle Anton after her visit to [facility] DPM

## 2017-07-09 NOTE — Telephone Encounter (Signed)
I informed pt of Dr. Gabriel Rung recommendation and that we had an appt with Dr. Stacie Acres in the [place] office at 3:15pm. Pt states that would be fine, and asked if she should go back in to the boot and I told her yes.

## 2017-07-10 ENCOUNTER — Other Ambulatory Visit: Payer: Self-pay | Admitting: Family Medicine

## 2017-07-10 NOTE — Telephone Encounter (Signed)
I will update PCP in system as Dr Beverely Low should not be listed until patient actually establishes.

## 2017-07-15 ENCOUNTER — Other Ambulatory Visit: Payer: Self-pay | Admitting: Physician Assistant

## 2017-07-29 ENCOUNTER — Telehealth: Payer: Self-pay | Admitting: Gastroenterology

## 2017-07-29 MED ORDER — SUCRALFATE 1 G PO TABS: 1.0000 g | ORAL_TABLET | Freq: Three times a day (TID) | ORAL | 0 refills | Status: DC

## 2017-07-29 NOTE — Telephone Encounter (Signed)
Prescription sent to patient's pharmacy.

## 2017-08-11 ENCOUNTER — Other Ambulatory Visit: Payer: Self-pay | Admitting: Family Medicine

## 2017-08-12 ENCOUNTER — Encounter: Payer: Self-pay | Admitting: General Practice

## 2017-08-12 ENCOUNTER — Encounter: Payer: Self-pay | Admitting: Family Medicine

## 2017-08-12 ENCOUNTER — Ambulatory Visit (INDEPENDENT_AMBULATORY_CARE_PROVIDER_SITE_OTHER): Payer: BLUE CROSS/BLUE SHIELD | Admitting: Family Medicine

## 2017-08-12 VITALS — BP 121/86 | HR 98 | Resp 16 | Ht 67.0 in | Wt 226.5 lb

## 2017-08-12 DIAGNOSIS — R682 Dry mouth, unspecified: Secondary | ICD-10-CM | POA: Insufficient documentation

## 2017-08-12 DIAGNOSIS — F411 Generalized anxiety disorder: Secondary | ICD-10-CM | POA: Diagnosis not present

## 2017-08-12 DIAGNOSIS — N3281 Overactive bladder: Secondary | ICD-10-CM

## 2017-08-12 DIAGNOSIS — R1313 Dysphagia, pharyngeal phase: Secondary | ICD-10-CM

## 2017-08-12 DIAGNOSIS — F1120 Opioid dependence, uncomplicated: Secondary | ICD-10-CM | POA: Diagnosis not present

## 2017-08-12 DIAGNOSIS — R1310 Dysphagia, unspecified: Secondary | ICD-10-CM | POA: Insufficient documentation

## 2017-08-12 HISTORY — DX: Dry mouth, unspecified: R68.2

## 2017-08-12 HISTORY — DX: Overactive bladder: N32.81

## 2017-08-12 MED ORDER — FIRST-DUKES MOUTHWASH MT SUSP: 5.0000 mL | Freq: Three times a day (TID) | OROMUCOSAL | 1 refills | Status: DC | PRN

## 2017-08-12 MED ORDER — PANTOPRAZOLE SODIUM 40 MG PO TBEC: 40.0000 mg | DELAYED_RELEASE_TABLET | Freq: Two times a day (BID) | ORAL | 3 refills | Status: DC

## 2017-08-12 MED ORDER — OXYBUTYNIN CHLORIDE ER 10 MG PO TB24: 10.0000 mg | ORAL_TABLET | Freq: Every day | ORAL | 6 refills | Status: DC

## 2017-08-12 NOTE — Assessment & Plan Note (Signed)
Chronic problem.  Pt has hx of poor dentition and is in the process of having her teeth pulled and replaced by partials/false teeth.  She reports that the plate she currently has causes excessive dry mouth.  Using Magic Mouthwash prn when she develops sores.  Asking for refill.  Encouraged her to get OTC Biotene at the pharmacy.  Will follow.

## 2017-08-12 NOTE — Progress Notes (Signed)
Pre visit review using our clinic review tool, if applicable. No additional management support is needed unless otherwise documented below in the visit note. 

## 2017-08-12 NOTE — Assessment & Plan Note (Signed)
New to provider, ongoing for pt.  There is a note from her previous PCP to not provide any additional controlled substances.  She is now getting her pain medication from [facility].

## 2017-08-12 NOTE — Assessment & Plan Note (Signed)
New to provider, ongoing for pt.  She is asking for refill on Ditropan, which can contribute to her dry mouth, but she reports she is not able to control her leakage without the medication.  May need urology referral in the future for better management.

## 2017-08-12 NOTE — Progress Notes (Signed)
   Subjective:    Patient ID: Diane [name], female    DOB: [birth year], 36 y.o.   MRN: [id number]  HPI New to establish.  Pt is transferring from [place] (Dr Clent Ridges) but has seen many doctors in the interim.  Chronic pain- pt is seeing pain management ([facility]).  Pt has notations in her chart that she has a hx of narcotic abuse and to not refill controlled substances.  Anxiety- chronic problem, pt reports she needs to wean to 2 diazepam in order for pain clinic to continue working with her on pain management.  Pt has seen Dr Omelia Blackwater (Psych) previously but states 'this was a mandatory 1 time thing'.    Dysphagia- having difficulty w/ both solids and liquids for 'a few months'.  Pt feels like the issue is pharyngeal.  Pt has hx of GERD and is on Aciphex twice daily.  Initially states she's not taking medication for GERD but then states she is taking the Aciphex. Pt reports increased GERD recently and hx of gastritis.  Bladder incontinence- pt is on Ditropan and needs refill.   Review of Systems For ROS see HPI     Objective:   Physical Exam  Constitutional: She is oriented to person, place, and time. She appears well-developed and well-nourished. No distress.  obese  HENT:  Head: Normocephalic and atraumatic.  Eyes: Pupils are equal, round, and reactive to light. Conjunctivae and EOM are normal.  Neck: Normal range of motion. Neck supple. No thyromegaly present.  Cardiovascular: Normal rate, regular rhythm and normal heart sounds.   Pulmonary/Chest: Effort normal and breath sounds normal. No respiratory distress. She has no wheezes. She has no rales.  Lymphadenopathy:    She has no cervical adenopathy.  Neurological: She is alert and oriented to person, place, and time.  Skin: Skin is warm and dry.  Psychiatric: She has a normal mood and affect. Her behavior is normal. Thought content normal.  Vitals reviewed.         Assessment & Plan:

## 2017-08-12 NOTE — Patient Instructions (Signed)
Schedule your complete physical in 3-4 months STOP the Aciphex START the Protonix twice daily.  If no improvement in the reflux or trouble swallowing, you will need to contact Dr Russella Dar Please call and schedule an appointment with your psychiatrist- I am not able to fill long term anxiety medication Look for OTC Biotene in the dental aisle at the store to improve your dry mouth Call with any questions or concerns Happy Early Birthday!

## 2017-08-12 NOTE — Assessment & Plan Note (Signed)
New to provider, ongoing for pt.  She saw Dr Omelia Blackwater (Psychiatrist) in July but reports 'this was a 1 time mandatory thing'.  I am not sure what she means by this, but I explained that since she has a psychiatrist available, I am not going to fill her chronic Diazepam as I do not feel this is appropriate as a maintenance medication- particularly in the setting of chronic narcotic use.  Pt was not pleased with this answer today, but that is how we will proceed.  She is to reach out to psych for ongoing treatment.

## 2017-08-12 NOTE — Assessment & Plan Note (Signed)
New.  Suspect that this is related to under treated GERD.  Switch Aciphex- which she may or may not be taking- to Protonix BID.  If no improvement, will need GI f/u w/ Dr Russella Dar.  Pt expressed understanding and is in agreement w/ plan.

## 2017-08-14 ENCOUNTER — Ambulatory Visit (INDEPENDENT_AMBULATORY_CARE_PROVIDER_SITE_OTHER): Payer: BLUE CROSS/BLUE SHIELD | Admitting: Physician Assistant

## 2017-08-14 ENCOUNTER — Encounter: Payer: Self-pay | Admitting: Physician Assistant

## 2017-08-14 ENCOUNTER — Telehealth: Payer: Self-pay | Admitting: Family Medicine

## 2017-08-14 ENCOUNTER — Telehealth: Payer: Self-pay | Admitting: Physician Assistant

## 2017-08-14 ENCOUNTER — Other Ambulatory Visit: Payer: Self-pay | Admitting: Family Medicine

## 2017-08-14 VITALS — BP 122/84 | HR 93 | Temp 98.7°F | Resp 14 | Ht 67.0 in | Wt 227.0 lb

## 2017-08-14 DIAGNOSIS — R110 Nausea: Secondary | ICD-10-CM

## 2017-08-14 DIAGNOSIS — H1031 Unspecified acute conjunctivitis, right eye: Secondary | ICD-10-CM | POA: Diagnosis not present

## 2017-08-14 DIAGNOSIS — L989 Disorder of the skin and subcutaneous tissue, unspecified: Secondary | ICD-10-CM | POA: Diagnosis not present

## 2017-08-14 MED ORDER — POLYMYXIN B-TRIMETHOPRIM 10000-0.1 UNIT/ML-% OP SOLN: OPHTHALMIC | 0 refills | Status: DC

## 2017-08-14 MED ORDER — PROMETHAZINE HCL 25 MG PO TABS: 25.0000 mg | ORAL_TABLET | Freq: Three times a day (TID) | ORAL | 0 refills | Status: DC | PRN

## 2017-08-14 MED ORDER — PROMETHAZINE HCL 25 MG/ML IJ SOLN
25.0000 mg | Freq: Once | INTRAMUSCULAR | Status: AC
  Administered 2017-08-14: 25 mg via INTRAMUSCULAR

## 2017-08-14 MED ORDER — TRIAMCINOLONE ACETONIDE 0.025 % EX OINT: 1.0000 "application " | TOPICAL_OINTMENT | Freq: Two times a day (BID) | CUTANEOUS | 0 refills | Status: DC

## 2017-08-14 NOTE — Progress Notes (Signed)
Pre visit review using our clinic review tool, if applicable. No additional management support is needed unless otherwise documented below in the visit note. 

## 2017-08-14 NOTE — Telephone Encounter (Signed)
Patient requesting refill of Erythromycin  ointment for eye redness and irritation.  She has run out of refills.  Pharmacy:  [facility] - [place], [place] - [address] [phone] (Phone) [phone] (Fax)

## 2017-08-14 NOTE — Telephone Encounter (Signed)
MEDICATION: erythromycin ointment   PHARMACY:   [facility] - [place] - [phone] (Phone) [phone] (Fax)     IS THIS A 90 DAY SUPPLY :   IS PATIENT OUT OF MEDICATION: Y  IF NOT; HOW MUCH IS LEFT:   LAST APPOINTMENT DATE: 04/21/17  NEXT APPOINTMENT DATE: N/A  OTHER COMMENTS: Patient stated she saw Dr. Beverely Low 08/13/17 and she informed patient she would need to get this refilled through [facility] since she prescribed it originally.    **Let patient know to contact pharmacy at the end of the day to make sure medication is ready. **  ** Please notify patient to allow 48-72 hours to process**  **Encourage patient to contact the pharmacy for refills or they can request refills through [facility]**

## 2017-08-14 NOTE — Telephone Encounter (Signed)
[facility] [facility] - Cli TELEPHONE ADVICE RECORD [facility]  Patient Name: Diane [name]  DOB: 01-Sep-1981    Initial Comment caller took contacts out last night . Her right eye is blood red and burning and hard to open this morning . She would like eye ointment called in because this has happened before    Nurse Assessment  Nurse: Katrina Stack, RN, Dahlia Client Date/Time (Eastern Time): 08/14/2017 8:50:09 AM  Confirm and document reason for call. If symptomatic, describe symptoms. ---Caller states she took her contacts out last night and now this morning her right eye is red, burning, and hard to open. It hurts to open her eye. This happened before and they called her in some eye ointment. It is 3.5 erythromycin  eye ointment. Would like a refill.  Does the patient have any new or worsening symptoms? ---Yes  Will a triage be completed? ---No  Select reason for no triage. ---Patient declined  Please document clinical information provided and list any resource used. ---Patient would like to have her eye cream sent in. Does not want triage services. She uses [facility] [phone]. Will contact office.     Guidelines    Guideline Title Affirmed Question Affirmed Notes       Final Disposition User        Comments  spoke with backline and informed them and they stated the message was taken to the doctor and someone would call the patient back today about her script. Informed patient that the doctor is working on her message in between seeing patients and the office will call her back later about her med. patient aware and was encouraged to call back PRN.  NOTE: Triage done by Nurse Dani Gobble RN and documentation into epic done by B. Fabienne Bruns, RN BSN

## 2017-08-14 NOTE — Telephone Encounter (Signed)
Please advise if ok to fill?  

## 2017-08-14 NOTE — Patient Instructions (Signed)
Please use the antibiotic/steroid drop as directed. No contacts in the eye until this has resolved. Avoid rubbing the eye. Can apply warm compress over the eye.  Start your Flonase daily -- you have fluid buildup behind the ears.   If not improving over 24 hours or if anything worsens, please go to the ER.

## 2017-08-14 NOTE — Telephone Encounter (Signed)
Encounter closed. Previous note sent to provider already.

## 2017-08-14 NOTE — Telephone Encounter (Signed)
Please advise 

## 2017-08-14 NOTE — Progress Notes (Signed)
Patient presents to clinic today c/o 1 day of eye redness with drainage and irritation. States she woke up like this this morning. Felt ok going to bed last night. Denies trauma or injury. Wears daily contacts which she had worn for a few days without switching out. Denies blurry vision or diplopia. Notes mild photosensitivity. Has also had some ear pressure and nasal congestion for a few days. Has noted nausea secondary to drainage. Is not using her allergy medications as directed. Denies fever, chills, aches.   Of note, patient notes a dry scaly patch of skin over each elbow. Denies history of eczema or psoriasis. Has applied OTC gold bond cream to the area without improvement.   Past Medical History:  Diagnosis Date  . ADD (attention deficit disorder with hyperactivity)    no current med.  . Anemia    takes iron supplement  . Anxiety   . Bronchitis    uses an inhaler  . Choking    due to food and pills get stuck  . Cough 10/16/2016  . Depression   . Family history of adverse reaction to anesthesia    pt's father has hx. of being hard to wake up post-op  . GERD (gastroesophageal reflux disease)   . History of gastric ulcer   . History of kidney stones   . IBS (irritable bowel syndrome)   . Migraines   . Osteoarthritis    right ankle  . Osteochondral lesion 09/2016   right ankle  . Plantar fasciitis of left foot   . PONV (postoperative nausea and vomiting)   . Pseudotumor cerebri    at back head  . Wears partial dentures    upper    Current Outpatient Prescriptions on File Prior to Visit  Medication Sig Dispense Refill  . albuterol (PROVENTIL HFA;VENTOLIN HFA) 108 (90 Base) MCG/ACT inhaler Inhale into the lungs as needed for wheezing or shortness of breath.    . beclomethasone (QVAR) 40 MCG/ACT inhaler Inhale 2 puffs into the lungs 2 (two) times daily. 1 Inhaler 0  . diazepam (VALIUM) 5 MG tablet Take 1 tablet (5 mg total) by mouth every 12 (twelve) hours as needed for  anxiety. 60 tablet 5  . dicyclomine (BENTYL) 20 MG tablet Take 1 tablet (20 mg total) by mouth 3 (three) times daily before meals. 90 tablet 2  . Diphenhyd-Hydrocort-Nystatin (FIRST-DUKES MOUTHWASH) SUSP Use as directed 5 mLs in the mouth or throat 3 (three) times daily as needed. 237 mL 1  . gabapentin (NEURONTIN) 300 MG capsule TAKE 2 CAPSULES BY MOUTH 3 TIMES DAILY 180 capsule 4  . HYDROcodone-acetaminophen (NORCO/VICODIN) 5-325 MG tablet Take one tablet every 4-6 hours prn foot pain. 30 tablet 0  . Magnesium 400 MG TABS Take 1,200 mg by mouth daily.     Marland Kitchen oxybutynin (DITROPAN-XL) 10 MG 24 hr tablet Take 1 tablet (10 mg total) by mouth at bedtime. 30 tablet 6  . pantoprazole (PROTONIX) 40 MG tablet Take 1 tablet (40 mg total) by mouth 2 (two) times daily. 60 tablet 3  . sucralfate (CARAFATE) 1 g tablet Take 1 tablet (1 g total) by mouth 4 (four) times daily -  with meals and at bedtime. 120 tablet 0  . tiZANidine (ZANAFLEX) 4 MG tablet Take 4 mg by mouth every 6 (six) hours as needed for muscle spasms.     Marland Kitchen venlafaxine XR (EFFEXOR-XR) 150 MG 24 hr capsule Take 2 capsules (300 mg total) by mouth daily before breakfast. 60  capsule 11   No current facility-administered medications on file prior to visit.     Allergies  Allergen Reactions  . Nickel     "any jewelry that's not real"/ rash and itching   . Nsaids Other (See Comments)    DUE TO HISTORY OF GASTRIC ULCER  . Adhesive [Tape] Rash  . Metoclopramide Hcl Other (See Comments)    RESTLESSNESS/JITTERY  . Prochlorperazine Edisylate Other (See Comments)    RESTLESSNESS/JITTERY     Family History  Problem Relation Age of Onset  . Brain cancer Father   . Anesthesia problems Father        hard to wake up post-op  . Lung cancer Father   . Breast cancer Maternal Grandmother   . Ovarian cancer Maternal Grandmother   . Diabetes Maternal Grandmother   . Heart disease Maternal Grandmother   . Irritable bowel syndrome Mother   .  Prostate cancer Maternal Grandfather   . Melanoma Maternal Grandfather   . Colon cancer Neg Hx     Social History   Social History  . Marital status: Married    Spouse name: N/A  . Number of children: 2  . Years of education: N/A   Occupational History  . 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 Smoking status: Current Some Day Smoker    Packs/day: 0.50    Years: 15.00    Types: Cigarettes  . Smokeless tobacco: Never Used  . Alcohol use 0.0 oz/week     Comment: occasionally - glass wine  . Drug use: No  . Sexual activity: Not Currently   Other Topics Concern  . None   Social History Narrative  . None   Review of Systems - See HPI.  All other ROS are negative.  BP 122/84   Pulse 93   Temp 98.7 F (37.1 C) (Oral)   Resp 14   Ht  (1.702 m)   Wt 227 lb (103 kg)   SpO2 97%   BMI 35.55 kg/m   Physical Exam  Constitutional: She is oriented to person, place, and time and well-developed, well-nourished, and in no distress.  HENT:  Head: Normocephalic and atraumatic.  Right Ear: External ear normal.  Left Ear: External ear normal.  Nose: Nose normal.  Mouth/Throat: Oropharynx is clear and moist. No oropharyngeal exudate.  Serous fluid noted behind TM bilaterally.   Eyes: Pupils are equal, round, and reactive to light. EOM are normal. Right conjunctiva is injected. Right conjunctiva has no hemorrhage. Left conjunctiva is not injected. Left conjunctiva has no hemorrhage.  Neck: Neck supple.  Cardiovascular: Normal rate, regular rhythm, normal heart sounds and intact distal pulses.   Pulmonary/Chest: Effort normal and breath sounds normal. No respiratory distress. She has no wheezes. She has no rales. She exhibits no tenderness.  Neurological: She is alert and oriented to person, place, and time.  Skin: Skin is warm and dry. No rash noted.  Psychiatric: Affect normal.  Vitals reviewed.  Assessment/Plan: 1. Acute bacterial  conjunctivitis of right eye Rx polytrim op. Supportive measures reviewed. No contacts until resolved. Alarm signs/symptoms reviewed that would prompt need for ER assessment. - trimethoprim-polymyxin b (POLYTRIM) ophthalmic solution; Apply 1-2 drops into affected eye, QID x 5 days  Dispense: 10 mL; Refill: 0  2. Nausea Secondary to drainage and pain. Phenergan given as patient tolerates well.  - promethazine (PHENERGAN) injection 25 mg; Inject 1 mL (25 mg total) into the muscle  once.  3. Eczematous skin lesions Trial of Kenalog ointment. Derm referral if not improving.     Piedad Climes, PA-C

## 2017-08-14 NOTE — Telephone Encounter (Signed)
I do not refill abx without OV

## 2017-08-14 NOTE — Telephone Encounter (Signed)
Called and advised pt, she was placed on schedule with PA at 2:30 advised that she will not get here until 2:40. I advised that was ok as long as she was here at that time. [name] was also advised.

## 2017-08-14 NOTE — Telephone Encounter (Signed)
No problem. Will gladly take care of it.

## 2017-09-30 ENCOUNTER — Other Ambulatory Visit: Payer: Self-pay | Admitting: Physician Assistant

## 2017-10-13 ENCOUNTER — Other Ambulatory Visit: Payer: Self-pay

## 2017-10-13 MED ORDER — DICYCLOMINE HCL 20 MG PO TABS: 20.0000 mg | ORAL_TABLET | Freq: Three times a day (TID) | ORAL | 2 refills | Status: DC

## 2017-11-23 ENCOUNTER — Ambulatory Visit: Payer: BLUE CROSS/BLUE SHIELD | Admitting: Physician Assistant

## 2017-11-23 ENCOUNTER — Encounter: Payer: Self-pay | Admitting: Physician Assistant

## 2017-11-23 VITALS — BP 108/72 | HR 94 | Temp 98.6°F | Resp 16 | Ht 67.0 in | Wt 230.0 lb

## 2017-11-23 DIAGNOSIS — R059 Cough, unspecified: Secondary | ICD-10-CM

## 2017-11-23 DIAGNOSIS — R05 Cough: Secondary | ICD-10-CM

## 2017-11-23 DIAGNOSIS — J208 Acute bronchitis due to other specified organisms: Secondary | ICD-10-CM | POA: Diagnosis not present

## 2017-11-23 DIAGNOSIS — B9689 Other specified bacterial agents as the cause of diseases classified elsewhere: Secondary | ICD-10-CM

## 2017-11-23 MED ORDER — DOXYCYCLINE HYCLATE 100 MG PO CAPS: 100.0000 mg | ORAL_CAPSULE | Freq: Two times a day (BID) | ORAL | 0 refills | Status: DC

## 2017-11-23 MED ORDER — IPRATROPIUM-ALBUTEROL 0.5-2.5 (3) MG/3ML IN SOLN
3.0000 mL | Freq: Once | RESPIRATORY_TRACT | Status: AC
  Administered 2017-11-23: 3 mL via RESPIRATORY_TRACT

## 2017-11-23 MED ORDER — PREDNISONE 20 MG PO TABS: 40.0000 mg | ORAL_TABLET | Freq: Every day | ORAL | 0 refills | Status: DC

## 2017-11-23 MED ORDER — FLUCONAZOLE 150 MG PO TABS: 150.0000 mg | ORAL_TABLET | Freq: Once | ORAL | 1 refills | Status: AC

## 2017-11-23 NOTE — Progress Notes (Signed)
Patient presents to clinic today c/o 5-6 days of chest congestion, chest tightness, wheezing and SOB along with a cough that is non-productive. Has also noted low-grade fever and chills. Denies sinus pressure/sinus pain, ear pain, tooth pain. Denies recent travel. Is taking Mucinex and Dayquil for symptoms relief along with albuterol inhaler.   Past Medical History:  Diagnosis Date  . ADD (attention deficit disorder with hyperactivity)    no current med.  . Anemia    takes iron supplement  . Anxiety   . Bronchitis    uses an inhaler  . Choking    due to food and pills get stuck  . Cough 10/16/2016  . Depression   . Family history of adverse reaction to anesthesia    pt's father has hx. of being hard to wake up post-op  . GERD (gastroesophageal reflux disease)   . History of gastric ulcer   . History of kidney stones   . IBS (irritable bowel syndrome)   . Migraines   . Osteoarthritis    right ankle  . Osteochondral lesion 09/2016   right ankle  . Plantar fasciitis of left foot   . PONV (postoperative nausea and vomiting)   . Pseudotumor cerebri    at back head  . Wears partial dentures    upper    Current Outpatient Medications on File Prior to Visit  Medication Sig Dispense Refill  . albuterol (PROVENTIL HFA;VENTOLIN HFA) 108 (90 Base) MCG/ACT inhaler Inhale into the lungs as needed for wheezing or shortness of breath.    . beclomethasone (QVAR) 40 MCG/ACT inhaler Inhale 2 puffs into the lungs 2 (two) times daily. 1 Inhaler 0  . diazepam (VALIUM) 5 MG tablet Take 1 tablet (5 mg total) by mouth every 12 (twelve) hours as needed for anxiety. 60 tablet 5  . dicyclomine (BENTYL) 20 MG tablet Take 1 tablet (20 mg total) by mouth 3 (three) times daily before meals. 90 tablet 2  . gabapentin (NEURONTIN) 300 MG capsule TAKE 2 CAPSULES BY MOUTH 3 TIMES DAILY 180 capsule 4  . Magnesium 400 MG TABS Take 1,200 mg by mouth daily.     Marland Kitchen. oxybutynin (DITROPAN-XL) 10 MG 24 hr tablet Take  1 tablet (10 mg total) by mouth at bedtime. 30 tablet 6  . oxyCODONE-acetaminophen (PERCOCET) 10-325 MG tablet Take 1 tablet by mouth every 4 (four) hours as needed for pain.    . pantoprazole (PROTONIX) 40 MG tablet Take 1 tablet (40 mg total) by mouth 2 (two) times daily. 60 tablet 3  . promethazine (PHENERGAN) 25 MG tablet Take 1 tablet (25 mg total) by mouth every 8 (eight) hours as needed for nausea or vomiting. 10 tablet 0  . sucralfate (CARAFATE) 1 g tablet Take 1 tablet (1 g total) by mouth 4 (four) times daily -  with meals and at bedtime. 120 tablet 0  . tiZANidine (ZANAFLEX) 4 MG tablet Take 4 mg by mouth every 6 (six) hours as needed for muscle spasms.     Marland Kitchen. venlafaxine XR (EFFEXOR-XR) 150 MG 24 hr capsule Take 2 capsules (300 mg total) by mouth daily before breakfast. 60 capsule 11   No current facility-administered medications on file prior to visit.     Allergies  Allergen Reactions  . Nickel     "any jewelry that's not real"/ rash and itching   . Nsaids Other (See Comments)    DUE TO HISTORY OF GASTRIC ULCER  . Adhesive [Tape] Rash  . Metoclopramide  Hcl Other (See Comments)    RESTLESSNESS/JITTERY  . Prochlorperazine Edisylate Other (See Comments)    RESTLESSNESS/JITTERY     Family History  Problem Relation Age of Onset  . Brain cancer Father   . Anesthesia problems Father        hard to wake up post-op  . Lung cancer Father   . Breast cancer Maternal Grandmother   . Ovarian cancer Maternal Grandmother   . Diabetes Maternal Grandmother   . Heart disease Maternal Grandmother   . Irritable bowel syndrome Mother   . Prostate cancer Maternal Grandfather   . Melanoma Maternal Grandfather   . Colon cancer Neg Hx     Social History   Socioeconomic History  . Marital status: Married    Spouse name: None  . Number of children: 2  . Years of education: None  . Highest education level: None  Social Needs  . Financial resource strain: None  . Food insecurity -  worry: None  . Food insecurity - inability: None  . Transportation needs - medical: None  . Transportation needs - non-medical: None  Occupational History  . Occupation: Investment banker, 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  Tobacco Use  . Smoking status: Current Some Day Smoker    Packs/day: 0.50    Years: 15.00    Pack years: 7.50    Types: Cigarettes  . Smokeless tobacco: Never Used  Substance and Sexual Activity  . Alcohol use: Yes    Alcohol/week: 0.0 oz    Comment: occasionally - glass wine  . Drug use: No  . Sexual activity: Not Currently  Other Topics Concern  . None  Social History Narrative  . None   Review of Systems - See HPI.  All other ROS are negative.  BP 108/72   Pulse 94   Temp 98.6 F (37 C) (Oral)   Resp 16   Ht 5\' 7"  (1.702 m)   Wt 230 lb (104.3 kg)   SpO2 95%   BMI 36.02 kg/m   Physical Exam  Constitutional: She is oriented to person, place, and time and well-developed, well-nourished, and in no distress.  HENT:  Head: Normocephalic and atraumatic.  Right Ear: External ear normal.  Left Ear: External ear normal.  Nose: Nose normal.  Mouth/Throat: Oropharynx is clear and moist. No oropharyngeal exudate.  TM within normal limits bilaterally.  Eyes: Conjunctivae are normal.  Neck: Neck supple.  Cardiovascular: Normal rate, regular rhythm, normal heart sounds and intact distal pulses.  Pulmonary/Chest: Effort normal. No respiratory distress. She has wheezes. She has no rales. She exhibits no tenderness.  Neurological: She is alert and oriented to person, place, and time.  Skin: Skin is warm and dry. No rash noted.  Psychiatric: Affect normal.  Vitals reviewed.  Assessment/Plan:  Acute bacterial bronchitis Rx Doxycycline.  Increase fluids.  Rest.  Saline nasal spray.  Probiotic.  Mucinex as directed.  Humidifier in bedroom. Prednisone burst given. Breathing improved with albuterol neb. Continue chronic respiratory medications.  Call or  return to clinic if symptoms are not improving.  - doxycycline (VIBRAMYCIN) 100 MG capsule; Take 1 capsule (100 mg total) by mouth 2 (two) times daily.  Dispense: 20 capsule; Refill: 0 - predniSONE (DELTASONE) 20 MG tablet; Take 2 tablets (40 mg total) by mouth daily with breakfast.  Dispense: 10 tablet; Refill: 0   Piedad Climes, PA-C

## 2017-11-23 NOTE — Patient Instructions (Signed)
Take antibiotic (Doxycycline) as directed.  Increase fluids.  Get plenty of rest. Use Mucinex for congestion. Delsym for cough. Prednisone burst as directed. Take a daily probiotic (I recommend Align or Culturelle, but even Activia Yogurt may be beneficial).  A humidifier placed in the bedroom may offer some relief for a dry, scratchy throat of nasal irritation.  Read information below on acute bronchitis. Please call or return to clinic if symptoms are not improving.  Acute Bronchitis Bronchitis is when the airways that extend from the windpipe into the lungs get red, puffy, and painful (inflamed). Bronchitis often causes thick spit (mucus) to develop. This leads to a cough. A cough is the most common symptom of bronchitis. In acute bronchitis, the condition usually begins suddenly and goes away over time (usually in 2 weeks). Smoking, allergies, and asthma can make bronchitis worse. Repeated episodes of bronchitis may cause more lung problems.  HOME CARE  Rest.  Drink enough fluids to keep your pee (urine) clear or pale yellow (unless you need to limit fluids as told by your doctor).  Only take over-the-counter or prescription medicines as told by your doctor.  Avoid smoking and secondhand smoke. These can make bronchitis worse. If you are a smoker, think about using nicotine gum or skin patches. Quitting smoking will help your lungs heal faster.  Reduce the chance of getting bronchitis again by:  Washing your hands often.  Avoiding people with cold symptoms.  Trying not to touch your hands to your mouth, nose, or eyes.  Follow up with your doctor as told.  GET HELP IF: Your symptoms do not improve after 1 week of treatment. Symptoms include:  Cough.  Fever.  Coughing up thick spit.  Body aches.  Chest congestion.  Chills.  Shortness of breath.  Sore throat.  GET HELP RIGHT AWAY IF:   You have an increased fever.  You have chills.  You have severe shortness of  breath.  You have bloody thick spit (sputum).  You throw up (vomit) often.  You lose too much body fluid (dehydration).  You have a severe headache.  You faint.  MAKE SURE YOU:   Understand these instructions.  Will watch your condition.  Will get help right away if you are not doing well or get worse. Document Released: 04/21/2008 Document Revised: 07/06/2013 Document Reviewed: 04/26/2013 [facility] Patient Information 2015 [place], [place]. This information is not intended to replace advice given to you by your health care provider. Make sure you discuss any questions you have with your health care provider.

## 2017-11-23 NOTE — Addendum Note (Signed)
Addended by: Con MemosMOORE, Marcellas Marchant S on: 11/23/2017 04:20 PM   Modules accepted: Orders

## 2017-11-26 ENCOUNTER — Ambulatory Visit: Payer: BLUE CROSS/BLUE SHIELD | Admitting: Family Medicine

## 2017-11-26 ENCOUNTER — Other Ambulatory Visit: Payer: Self-pay | Admitting: Family Medicine

## 2017-11-26 ENCOUNTER — Ambulatory Visit (INDEPENDENT_AMBULATORY_CARE_PROVIDER_SITE_OTHER): Payer: BLUE CROSS/BLUE SHIELD

## 2017-11-26 ENCOUNTER — Encounter: Payer: Self-pay | Admitting: Family Medicine

## 2017-11-26 ENCOUNTER — Ambulatory Visit: Payer: Self-pay | Admitting: *Deleted

## 2017-11-26 VITALS — BP 134/86 | HR 106 | Temp 98.9°F | Ht 67.0 in | Wt 226.2 lb

## 2017-11-26 DIAGNOSIS — J209 Acute bronchitis, unspecified: Secondary | ICD-10-CM | POA: Diagnosis not present

## 2017-11-26 DIAGNOSIS — R1313 Dysphagia, pharyngeal phase: Secondary | ICD-10-CM

## 2017-11-26 DIAGNOSIS — F17210 Nicotine dependence, cigarettes, uncomplicated: Secondary | ICD-10-CM

## 2017-11-26 MED ORDER — HYDROCOD POLST-CPM POLST ER 10-8 MG/5ML PO SUER: 5.0000 mL | Freq: Two times a day (BID) | ORAL | 0 refills | Status: DC | PRN

## 2017-11-26 MED ORDER — PREDNISONE 20 MG PO TABS: 20.0000 mg | ORAL_TABLET | Freq: Every day | ORAL | 0 refills | Status: DC

## 2017-11-26 MED ORDER — ALBUTEROL SULFATE (2.5 MG/3ML) 0.083% IN NEBU
2.5000 mg | INHALATION_SOLUTION | Freq: Once | RESPIRATORY_TRACT | Status: AC
  Administered 2017-11-26: 2.5 mg via RESPIRATORY_TRACT

## 2017-11-26 NOTE — Progress Notes (Signed)
Subjective  CC:  Chief Complaint  Patient presents with  . Cough    non-productive  . Chest congestion    HPI: Diane [name] is a 37 y.o. female who presents to the office today to address the problems listed above in the chief complaint.  37 year old female smoker was evaluated 3 days ago for acute bronchitis.  She was treated with albuterol neb in the office which helped resolved her bronchospasm.  Sent home on prednisone, doxycycline, and steroid burst.  She returns today due to persistent if not worsening symptoms.  She complains of chills, chest tightness, inhalational chest pain, constant coughing, choking on food.  Chart was reviewed.  Patient is on inhaler and Qvar but does not carry a diagnosis of asthma.  Patient denies shortness of breath or dyspnea on exertion.  She has not noted improvement with the prednisone.  No lower extremity swelling or calf pain.  No GI symptoms .  Review of systems positive for vertigo and left ear fullness  Status post foot surgery with as needed narcotic use but not using any now. I reviewed the patients updated PMH, FH, and SocHx.    Patient Active Problem List   Diagnosis Date Noted  . OAB (overactive bladder) 08/12/2017  . Dysphagia 08/12/2017  . Dry mouth 08/12/2017  . Osteochondral lesion 06/03/2017  . OSA (obstructive sleep apnea) 03/24/2017  . Nausea and vomiting 12/03/2016  . RUQ pain   . Cholelithiasis and acute cholecystitis without obstruction 12/02/2016  . Ankle fracture 01/05/2016  . Chronic pain of right knee 04/12/2015  . Gastric ulcer 03/16/2015  . Chronic cough 12/27/2013  . Pleural effusion 12/15/2013  . Chronic migraine without aura 04/01/2013  . DEPRESSION, ACUTE 10/02/2008  . Narcotic dependence (HCC) 10/02/2008  . BENIGN INTRACRANIAL HYPERTENSION 06/16/2008  . BACK PAIN 03/28/2008  . WEIGHT GAIN 01/25/2008  . Anxiety state 11/09/2007  . HEADACHE 07/09/2007   Current Meds  Medication Sig  . albuterol  (PROVENTIL HFA;VENTOLIN HFA) 108 (90 Base) MCG/ACT inhaler Inhale into the lungs as needed for wheezing or shortness of breath.  . beclomethasone (QVAR) 40 MCG/ACT inhaler Inhale 2 puffs into the lungs 2 (two) times daily.  . diazepam (VALIUM) 5 MG tablet Take 1 tablet (5 mg total) by mouth every 12 (twelve) hours as needed for anxiety.  . dicyclomine (BENTYL) 20 MG tablet Take 1 tablet (20 mg total) by mouth 3 (three) times daily before meals.  Marland Kitchen doxycycline (VIBRAMYCIN) 100 MG capsule Take 1 capsule (100 mg total) by mouth 2 (two) times daily.  Marland Kitchen gabapentin (NEURONTIN) 300 MG capsule TAKE 2 CAPSULES BY MOUTH 3 TIMES DAILY  . Magnesium 400 MG TABS Take 1,200 mg by mouth daily.   Marland Kitchen oxybutynin (DITROPAN-XL) 10 MG 24 hr tablet Take 1 tablet (10 mg total) by mouth at bedtime.  Marland Kitchen oxyCODONE-acetaminophen (PERCOCET) 10-325 MG tablet Take 1 tablet by mouth every 4 (four) hours as needed for pain.  . pantoprazole (PROTONIX) 40 MG tablet TAKE ONE TABLET BY MOUTH TWICE DAILY  . predniSONE (DELTASONE) 20 MG tablet Take 2 tablets (40 mg total) by mouth daily with breakfast.  . promethazine (PHENERGAN) 25 MG tablet Take 1 tablet (25 mg total) by mouth every 8 (eight) hours as needed for nausea or vomiting.  Marland Kitchen tiZANidine (ZANAFLEX) 4 MG tablet Take 4 mg by mouth every 6 (six) hours as needed for muscle spasms.   Marland Kitchen venlafaxine XR (EFFEXOR-XR) 150 MG 24 hr capsule Take 2 capsules (300 mg  total) by mouth daily before breakfast.    Allergies: Patient is allergic to nickel; nsaids; adhesive [tape]; metoclopramide hcl; prochlorperazine edisylate; and sulfa antibiotics. Family History: Patient family history includes Anesthesia problems in her father; Brain cancer in her father; Breast cancer in her maternal grandmother; Diabetes in her maternal grandmother; Heart disease in her maternal grandmother; Irritable bowel syndrome in her mother; Lung cancer in her father; Melanoma in her maternal grandfather; Ovarian cancer  in her maternal grandmother; Prostate cancer in her maternal grandfather. Social History:  Patient  reports that she has been smoking cigarettes.  She has a 7.50 pack-year smoking history. she has never used smokeless tobacco. She reports that she drinks alcohol. She reports that she does not use drugs.  Review of Systems: Constitutional: Negative for fever malaise or anorexia Cardiovascular: negative for chest pain Respiratory: negative for SOB but having persistent cough Gastrointestinal: negative for abdominal pain  Objective  Vitals: BP 134/86 (BP Location: Left Arm, Patient Position: Sitting, Cuff Size: Large)   Pulse (!) 106   Temp 98.9 F (37.2 C) (Oral)   Ht 5\' 7"  (1.702 m)   Wt 226 lb 4 oz (102.6 kg)   LMP 11/04/2017   SpO2 96%   BMI 35.44 kg/m  General: no acute distress but hacking cough present, no respiratory distress.  Able to speak in full sentences, A&Ox3 HEENT: PEERL, conjunctiva normal, Oropharynx moist,neck is supple, left TM with serous effusion, right TM normal, Cardiovascular:  RRR without murmur or gallop.  Respiratory: Decreased air movement with some expiratory wheeze, after albuterol nebulization treatment, improved aeration with increased expiratory wheezing, some rhonchi on the left, no rales, no tachypnea or retractions  skin:  Warm, no rashes  Assessment  1. Bronchitis with bronchospasm   2. Pharyngeal dysphagia   3. Tobacco dependence due to cigarettes      Plan   Bronchitis with bronchospasm: Still with significant wheezing.  Educated, would increase albuterol use to every 4 hours until breathing is improved.  Add cough medicines.  Increase steroid burst to 60 mg daily.  Add Mucinex DM to decrease viscosity of secretions.  Check chest x-ray to rule out infiltrate follow-up in 7-10 days.  Complete antibiotics .  Respiratory status is stable.  Need to clarify diagnosis of asthma or COPD or WARI  Recommend quitting smoking.  Patient complains of  dysphasia as a chronic problem.  Currently worsening due to inflammation and thick postnasal drainage, add Mucinex.  Avoid dry foods.  Drink plenty of water.  Follow-up with PCP if symptoms persist for further evaluation  Follow up: 7-10 days for recheck   Commons side effects, risks, benefits, and alternatives for medications and treatment plan prescribed today were discussed, and the patient expressed understanding of the given instructions. Patient is instructed to call or message via MyChart if he/she has any questions or concerns regarding our treatment plan. No barriers to understanding were identified. We discussed Red Flag symptoms and signs in detail. Patient expressed understanding regarding what to do in case of urgent or emergency type symptoms.   Medication list was reconciled, printed and provided to the patient in AVS. Patient instructions and summary information was reviewed with the patient as documented in the AVS. This note was prepared with assistance of Dragon voice recognition software. Occasional wrong-word or sound-a-like substitutions may have occurred due to the inherent limitations of voice recognition software  Orders Placed This Encounter  Procedures  . DG Chest 2 View   Meds ordered this encounter  Medications  . predniSONE (DELTASONE) 20 MG tablet    Sig: Take 1 tablet (20 mg total) by mouth daily with breakfast.    Dispense:  5 tablet    Refill:  0  . chlorpheniramine-HYDROcodone (TUSSIONEX PENNKINETIC ER) 10-8 MG/5ML SUER    Sig: Take 5 mLs by mouth every 12 (twelve) hours as needed for cough.    Dispense:  140 mL    Refill:  0

## 2017-11-26 NOTE — Telephone Encounter (Signed)
Recommend she come in for re-evaluation if symptoms are worsening.

## 2017-11-26 NOTE — Patient Instructions (Addendum)
Please return in 7-10 days with me for recheck. Please go to our [facility] office to get your xrays done. You can walk in M-F between 8am and 5pm. Tell them you are there for xrays ordered by me. They will send me the results, then I will let you know the results with instructions.   Address: [address] [place], [place], [place] [phone]  (office sits at [address] at [address] intersection; from here, turn left onto [address] take to [address], turn right and go for a mile or so, office will be on left across form [employer] )  How to Use a Metered Dose Inhaler A metered dose inhaler is a handheld device for taking medicine that must be breathed into the lungs (inhaled). The device can be used to deliver a variety of inhaled medicines, including:  Quick relief or rescue medicines, such as bronchodilators.  Controller medicines, such as corticosteroids.  The medicine is delivered by pushing down on a metal canister to release a preset amount of spray and medicine. Each device contains the amount of medicine that is needed for a preset number of uses (inhalations). Your health care provider may recommend that you use a spacer with your inhaler to help you take the medicine more effectively. A spacer is a plastic tube with a mouthpiece on one end and an opening that connects to the inhaler on the other end. A spacer holds the medicine in a tube for a short time, which allows you to inhale more medicine. What are the risks? If you do not use your inhaler correctly, medicine might not reach your lungs to help you breathe. Inhaler medicine can cause side effects, such as:  Mouth or throat infection.  Cough.  Hoarseness.  Headache.  Nausea and vomiting.  Lung infection (pneumonia) in people who have a lung condition called COPD.  How to use a metered dose inhaler without a spacer 1. Remove the cap from the inhaler. 2. If  you are using the inhaler for the first time, shake it for 5 seconds, turn it away from your face, then release 4 puffs into the air. This is called priming. 3. Shake the inhaler for 5 seconds. 4. Position the inhaler so the top of the canister faces up. 5. Put your index finger on the top of the medicine canister. Support the bottom of the inhaler with your thumb. 6. Breathe out normally and as completely as possible, away from the inhaler. 7. Either place the inhaler between your teeth and close your lips tightly around the mouthpiece, or hold the inhaler 1-2 inches (2.5-5 cm) away from your open mouth. Keep your tongue down out of the way. If you are unsure which technique to use, ask your health care provider. 8. Press the canister down with your index finger to release the medicine, then inhale deeply and slowly through your mouth (not your nose) until your lungs are completely filled. Inhaling should take 4-6 seconds. 9. Hold the medicine in your lungs for 5-10 seconds (10 seconds is best). This helps the medicine get into the small airways of your lungs. 10. With your lips in a tight circle (pursed), breathe out slowly. 11. Repeat steps 3-10 until you have taken the number of puffs that your health care provider directed. Wait about 1 minute between puffs or as directed. 12. Put the cap on the inhaler. 13. If you are using a steroid inhaler, rinse your  mouth with water, gargle, and spit out the water. Do not swallow the water. How to use a metered dose inhaler with a spacer 1. Remove the cap from the inhaler. 2. If you are using the inhaler for the first time, shake it for 5 seconds, turn it away from your face, then release 4 puffs into the air. This is called priming. 3. Shake the inhaler for 5 seconds. 4. Place the open end of the spacer onto the inhaler mouthpiece. 5. Position the inhaler so the top of the canister faces up and the spacer mouthpiece faces you. 6. Put your index finger on  the top of the medicine canister. Support the bottom of the inhaler and the spacer with your thumb. 7. Breathe out normally and as completely as possible, away from the spacer. 8. Place the spacer between your teeth and close your lips tightly around it. Keep your tongue down out of the way. 9. Press the canister down with your index finger to release the medicine, then inhale deeply and slowly through your mouth (not your nose) until your lungs are completely filled. Inhaling should take 4-6 seconds. 10. Hold the medicine in your lungs for 5-10 seconds (10 seconds is best). This helps the medicine get into the small airways of your lungs. 11. With your lips in a tight circle (pursed), breathe out slowly. 12. Repeat steps 3-11 until you have taken the number of puffs that your health care provider directed. Wait about 1 minute between puffs or as directed. 13. Remove the spacer from the inhaler and put the cap on the inhaler. 14. If you are using a steroid inhaler, rinse your mouth with water, gargle, and spit out the water. Do not swallow the water. Follow these instructions at home:  Take your inhaled medicine only as told by your health care provider. Do not use the inhaler more than directed by your health care provider.  Keep all follow-up visits as told by your health care provider. This is important.  If your inhaler has a counter, you can check it to determine how full your inhaler is. If your inhaler does not have a counter, ask your health care provider when you will need to refill your inhaler and write the refill date on a calendar or on your inhaler canister. Note that you cannot know when an inhaler is empty by shaking it.  Follow directions on the package insert for care and cleaning of your inhaler and spacer. Contact a health care provider if:  Symptoms are only partially relieved with your inhaler.  You are having trouble using your inhaler.  You have an increase in  phlegm.  You have headaches. Get help right away if:  You feel little or no relief after using your inhaler.  You have dizziness.  You have a fast heart rate.  You have chills or a fever.  You have night sweats.  There is blood in your phlegm. Summary  A metered dose inhaler is a handheld device for taking medicine that must be breathed into the lungs (inhaled).  The medicine is delivered by pushing down on a metal canister to release a preset amount of spray and medicine.  Each device contains the amount of medicine that is needed for a preset number of uses (inhalations). This information is not intended to replace advice given to you by your health care provider. Make sure you discuss any questions you have with your health care provider. Document Released: 11/03/2005 Document  Revised: 09/23/2016 Document Reviewed: 09/23/2016 Elsevier Interactive Patient Education  2017 [employer]    If you have any questions or concerns, please don't hesitate to send me a message via MyChart or call the office at [phone]. Thank you for visiting with us today! It's our pleasure caring for you.

## 2017-11-26 NOTE — Telephone Encounter (Signed)
Patient scheduled to see Dr. Mardelle MatteAndy today at 1:45

## 2017-11-26 NOTE — Addendum Note (Signed)
Addended by: Jimmye NormanPHANOS, [place] on: 11/26/2017 04:12 PM   Modules accepted: Orders

## 2017-11-26 NOTE — Telephone Encounter (Signed)
Pt called with complaints of not feeling better after her visit on 11/23/17; she states that her cough has worsened and she is wheezing; she has been taking delsym along with the medications prescribed on 11/23/17; pt states that she has been taking tylenol around the clock; spoke with Mary[facility] in [facility] and was advised to send message to provider, and they will contact the pt with instructions; the pt's best contact number is [phone].    Reason for Disposition . Patient sounds very sick or weak to the triager  Answer Assessment - Initial Assessment Questions 1. ONSET: "When did the cough begin?"      11/17/17 2. SEVERITY: "How bad is the cough today?"      severe 3. RESPIRATORY DISTRESS: "Describe your breathing."      Wheezing, short of breath when walking up stairs or laying down  4. FEVER: "Do you have a fever?" If so, ask: "What is your temperature, how was it measured, and when did it start?"     Feels like it but has not taken it 5. SPUTUM: "Describe the color of your sputum" (clear, white, yellow, green)     no 6. HEMOPTYSIS: "Are you coughing up any blood?" If so ask: "How much?" (flecks, streaks, tablespoons, etc.)     no 7. CARDIAC HISTORY: "Do you have any history of heart disease?" (e.g., heart attack, congestive heart failure)      no 8. LUNG HISTORY: "Do you have any history of lung disease?"  (e.g., pulmonary embolus, asthma, emphysema)     Smoker; is cutting back 9. PE RISK FACTORS: "Do you have a history of blood clots?" (or: recent major surgery, recent prolonged travel, bedridden )     September 15, 2017 right ankle 10. OTHER SYMPTOMS: "Do you have any other symptoms?" (e.g., runny nose, wheezing, chest pain)       Wheezing; chest tightness, light runny nose, chills, vertigo and pressurein both ears L>R 11. PREGNANCY: "Is there any chance you are pregnant?" "When was your last menstrual period?"       No LMP should start in a week 12. TRAVEL: "Have you traveled out  of the country in the last month?" (e.g., travel history, exposures)       Exposure step father was in [place] for 7 days and got back November 05, 2017, and her cousin's daughter was hospitalized with RSV and pneumonia in mid December 2018  Protocols used: COUGH - ACUTE PRODUCTIVE-A-AH

## 2017-11-27 NOTE — Progress Notes (Signed)
Please call patient: I have reviewed his/her lab results. Please let patient know that her chest xray is clear. There is no sign of lung infection or pnuemonia.

## 2017-11-30 ENCOUNTER — Encounter: Payer: Self-pay | Admitting: Family Medicine

## 2017-12-03 ENCOUNTER — Telehealth: Payer: Self-pay | Admitting: Family Medicine

## 2017-12-03 NOTE — Telephone Encounter (Signed)
Patient dismissed from [facility] by Neena RhymesKatherine Tabori MD, effective November 30, 2017. Dismissal letter sent out by certified / registered mail.  daj

## 2017-12-16 NOTE — Telephone Encounter (Signed)
Copied from CRM [id number]. Topic: General - Other >> Dec 15, 2017  6:06 PM Stephannie LiSimmons, Janett L, NT wrote: Reason for CRM: Patient called in regards to being dismissed from the practice  please call to discuss  in further detail [phone]  >> Dec 16, 2017  8:12 AM Eulah PontAlbright, Lisa M, CMA wrote: Diane [name] patient routed to [facility].  Sending to Research officer, political partypractice administrator.    I called and spoke with patient in detail about dismissal. She feels it is unfair and states that she told Dr Mardelle MatteAndy she was taking 2-4 of her hydrocodone daily and agreed not to take any while she took the tussionex. Feels it is now her word against ours and she and her husband will find new providers.

## 2017-12-21 ENCOUNTER — Ambulatory Visit: Payer: BLUE CROSS/BLUE SHIELD | Admitting: Family Medicine

## 2017-12-25 ENCOUNTER — Other Ambulatory Visit: Payer: Self-pay | Admitting: Family Medicine

## 2017-12-29 NOTE — Telephone Encounter (Signed)
Sent to correct PCP

## 2018-01-01 NOTE — Telephone Encounter (Signed)
Certified dismissal letter returned as undeliverable, unclaimed, return to sender after three attempts by [employer]PS on January 01, 2018. Letter placed in another envelope and resent as 1st class mail which does not require a signature. daj

## 2018-01-27 ENCOUNTER — Other Ambulatory Visit: Payer: Self-pay | Admitting: Gastroenterology

## 2018-04-09 IMAGING — DX DG CHEST 2V
2 series · 2 of 2 positions shown · non-contrast
Comparison: January 11, 2014

CLINICAL DATA: Cough for 8 days

EXAM:
CHEST  2 VIEW

[chest pa]
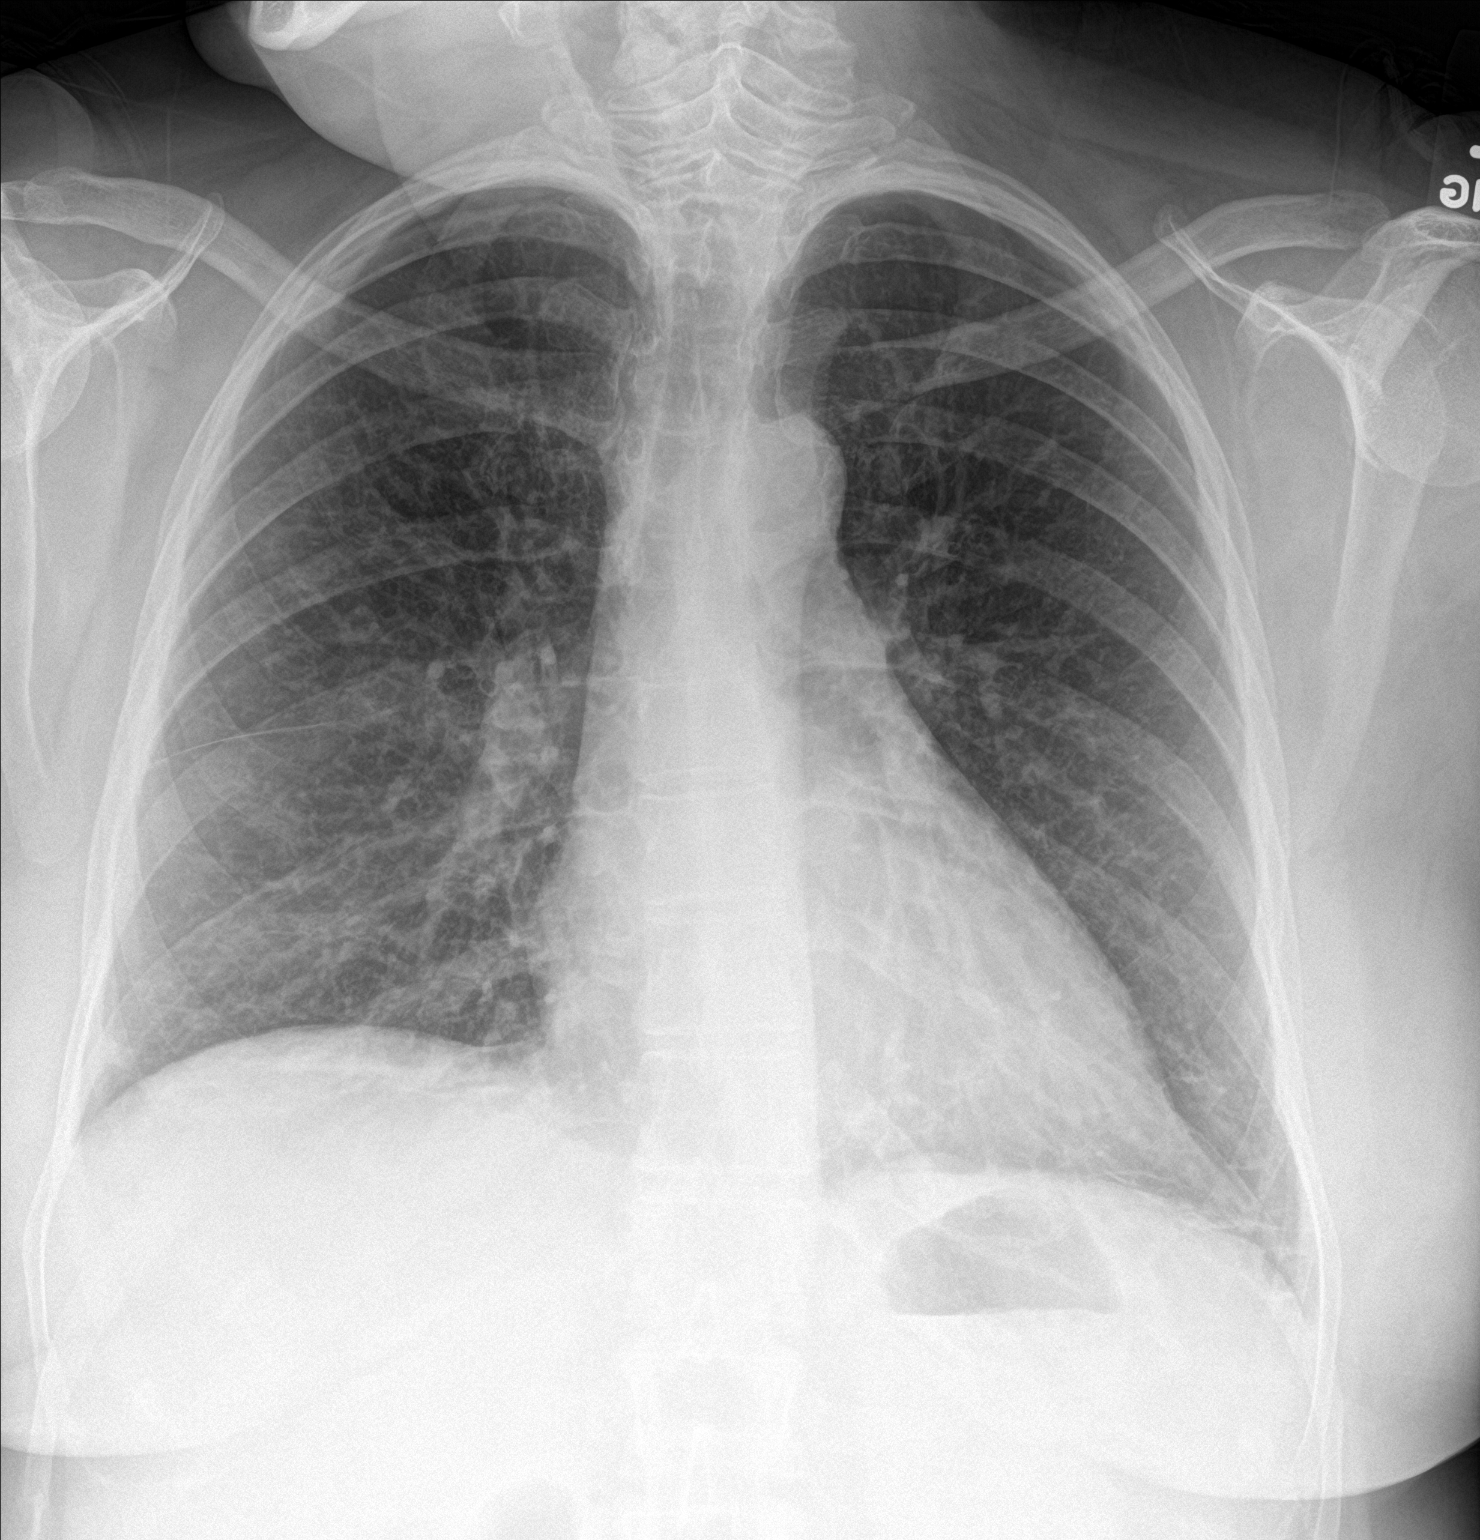

[chest lat]
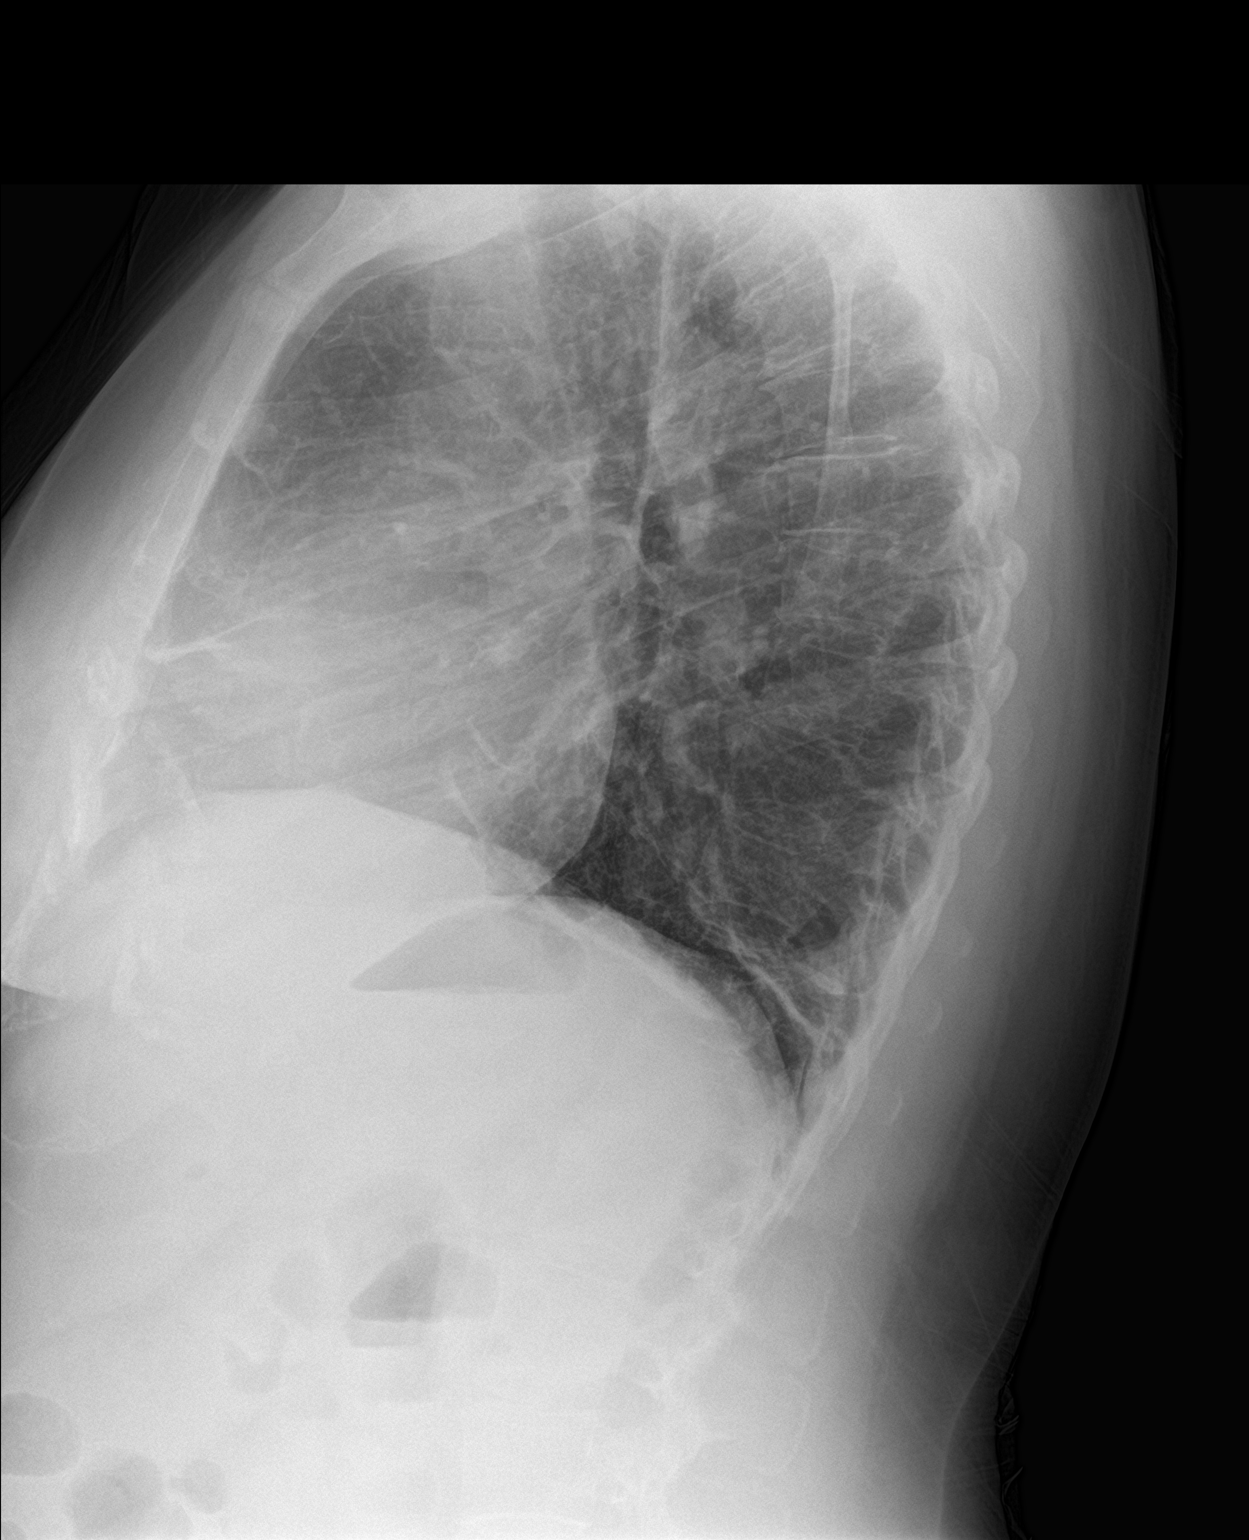

[2 of 2 positions shown; findings below may reference images not displayed]

FINDINGS: There is mild generalized interstitial thickening. There is no frank
edema or consolidation. There is mild left base atelectasis. Heart
is upper normal in size with pulmonary vascularity within normal
limits. No adenopathy. There is atherosclerotic calcification in the
aorta. No bone lesions.
IMPRESSION: Mild generalized interstitial thickening, not present previously.
Etiology for this change in interstitial thickening compared prior
study is uncertain. There does not appear to be frank edema. There
is mild left base atelectasis. No airspace consolidation. Stable
cardiac silhouette. A small amount of calcification is noted aortic
arch region consistent with mild aortic atherosclerosis.

## 2018-05-05 ENCOUNTER — Other Ambulatory Visit: Payer: Self-pay | Admitting: Family Medicine

## 2018-05-06 ENCOUNTER — Other Ambulatory Visit: Payer: Self-pay | Admitting: Gastroenterology

## 2018-06-03 ENCOUNTER — Other Ambulatory Visit: Payer: Self-pay

## 2018-06-03 MED ORDER — DICYCLOMINE HCL 20 MG PO TABS: ORAL_TABLET | ORAL | 0 refills | Status: DC

## 2018-06-08 ENCOUNTER — Other Ambulatory Visit: Payer: Self-pay | Admitting: Pulmonary Disease

## 2018-06-26 ENCOUNTER — Other Ambulatory Visit: Payer: Self-pay | Admitting: Gastroenterology

## 2018-07-05 ENCOUNTER — Other Ambulatory Visit: Payer: Self-pay | Admitting: Gastroenterology

## 2018-07-12 ENCOUNTER — Encounter (HOSPITAL_COMMUNITY): Payer: Self-pay | Admitting: *Deleted

## 2018-07-12 ENCOUNTER — Other Ambulatory Visit: Payer: Self-pay

## 2018-07-15 ENCOUNTER — Other Ambulatory Visit: Payer: Self-pay | Admitting: Obstetrics and Gynecology

## 2018-07-18 DIAGNOSIS — Z8719 Personal history of other diseases of the digestive system: Secondary | ICD-10-CM

## 2018-07-18 DIAGNOSIS — Z9889 Other specified postprocedural states: Secondary | ICD-10-CM

## 2018-07-18 HISTORY — PX: ESOPHAGOGASTRODUODENOSCOPY (EGD) WITH ESOPHAGEAL DILATION: SHX5812

## 2018-07-18 HISTORY — DX: Other specified postprocedural states: Z98.890

## 2018-07-18 HISTORY — DX: Personal history of other diseases of the digestive system: Z87.19

## 2018-07-26 ENCOUNTER — Ambulatory Visit (HOSPITAL_COMMUNITY): Admission: AD | Admit: 2018-07-26 | Payer: 59 | Source: Ambulatory Visit | Admitting: Obstetrics and Gynecology

## 2018-07-26 HISTORY — DX: Peripheral vascular disease, unspecified: I73.9

## 2018-07-26 SURGERY — DILATATION & CURETTAGE/HYSTEROSCOPY WITH NOVASURE ABLATION
Anesthesia: Choice

## 2018-08-10 ENCOUNTER — Other Ambulatory Visit: Payer: Self-pay | Admitting: Obstetrics and Gynecology

## 2018-08-12 DIAGNOSIS — J329 Chronic sinusitis, unspecified: Secondary | ICD-10-CM

## 2018-08-12 HISTORY — DX: Chronic sinusitis, unspecified: J32.9

## 2018-08-19 ENCOUNTER — Encounter (HOSPITAL_COMMUNITY): Payer: Self-pay

## 2018-08-19 ENCOUNTER — Other Ambulatory Visit: Payer: Self-pay

## 2018-08-19 ENCOUNTER — Encounter (HOSPITAL_COMMUNITY)
Admission: RE | Admit: 2018-08-19 | Discharge: 2018-08-19 | Disposition: A | Discharge status: Home / Self Care | Payer: 59 | Source: Ambulatory Visit | Attending: Obstetrics and Gynecology | Admitting: Obstetrics and Gynecology

## 2018-08-19 DIAGNOSIS — Z01818 Encounter for other preprocedural examination: Secondary | ICD-10-CM | POA: Diagnosis present

## 2018-08-19 DIAGNOSIS — N946 Dysmenorrhea, unspecified: Secondary | ICD-10-CM | POA: Insufficient documentation

## 2018-08-19 HISTORY — DX: Obstructive sleep apnea (adult) (pediatric): G47.33

## 2018-08-19 HISTORY — DX: Dysmenorrhea, unspecified: N94.6

## 2018-08-19 HISTORY — DX: Chronic sinusitis, unspecified: J32.9

## 2018-08-19 HISTORY — DX: Other specified postprocedural states: Z98.890

## 2018-08-19 HISTORY — DX: Stress incontinence (female) (male): N39.3

## 2018-08-19 HISTORY — DX: Presence of spectacles and contact lenses: Z97.3

## 2018-08-19 HISTORY — DX: Osteomyelitis, unspecified: M86.9

## 2018-08-19 HISTORY — DX: Personal history of other diseases of the digestive system: Z87.19

## 2018-08-19 LAB — CBC
HEMATOCRIT: 37.6 % (ref 36.0–46.0)
Hemoglobin: 12.1 g/dL (ref 12.0–15.0)
MCH: 28.9 pg (ref 26.0–34.0)
MCHC: 32.2 g/dL (ref 30.0–36.0)
MCV: 90 fL (ref 78.0–100.0)
Platelets: 345 10*3/uL (ref 150–400)
RBC: 4.18 MIL/uL (ref 3.87–5.11)
RDW: 13 % (ref 11.5–15.5)
WBC: 7.4 10*3/uL (ref 4.0–10.5)

## 2018-08-19 LAB — BASIC METABOLIC PANEL
Anion gap: 7 (ref 5–15)
BUN: 10 mg/dL (ref 6–20)
CO2: 27 mmol/L (ref 22–32)
Calcium: 9.6 mg/dL (ref 8.9–10.3)
Chloride: 107 mmol/L (ref 98–111)
Creatinine, Ser: 0.7 mg/dL (ref 0.44–1.00)
GFR calc Af Amer: >60 mL/min (ref >60)
Glucose, Bld: 122 mg/dL — ABNORMAL HIGH (ref 70–99)
POTASSIUM: 4.2 mmol/L (ref 3.5–5.1)
Sodium: 141 mmol/L (ref 135–145)

## 2018-08-19 LAB — HCG, SERUM, QUALITATIVE: PREG SERUM: NEGATIVE

## 2018-08-19 NOTE — Patient Instructions (Addendum)
Diane [name]  1981-04-08    Your procedure is scheduled on:  08-27-2018   Report to [facility] Main  Entrance,  Report to admitting at  11:00 AM    Call this number if you have problems the morning of surgery [phone]     Remember: Do not eat food After Midnight. May have clear liquid diet from midnight until 7:00 AM day of surgery then nothing by mouth.                 BRUSH YOUR TEETH MORNING OF SURGERY AND RINSE YOUR MOUTH OUT, NO CHEWING GUM CANDY OR MINTS.     Take these medicines the morning of surgery with A SIP OF WATER:  Gabapentin, Venlafaxine (effexor),  Pantoprazole,  Tizanidine and Oxycodone if needed,  Albuterol inhaler if needed and bring with you to hospital                                You may not have any metal on your body including hair pins and              piercings                Do not wear jewelry, make-up, lotions, powders or perfumes, deodorant                          Do not wear nail  polish.                 Do not shave  48 hours prior to surgery.     Do not bring valuables to the hospital. [facility] IS NOT             RESPONSIBLE   FOR VALUABLES.  Contacts, dentures or bridgework may not be worn into surgery.  Leave suitcase in the car. After surgery it may be brought to your room.     Special Instructions:  Handout for Recovery Care Guidelines at [facility]              Please read over the following fact sheets you were given: _____________________________________________________________________     CLEAR LIQUID DIET   Foods Allowed                                                                     Foods Excluded  Coffee and tea, regular and decaf                             liquids that you cannot  Plain Jell-O in any flavor                                             see through such as: Fruit ices (not with fruit pulp)  milk, soups, orange  juice  Iced Popsicles                                    All solid food Carbonated beverages, regular and diet                                    Cranberry, grape and apple juices Sports drinks like Gatorade Lightly seasoned clear broth or consume(fat free) Sugar, honey syrup  Sample Menu Breakfast                                Lunch                                     Supper Cranberry juice                    Beef broth                            Chicken broth Jell-O                                     Grape juice                           Apple juice Coffee or tea                        Jell-O                                      Popsicle                                                Coffee or tea                        Coffee or tea  _____________________________________________________________________            [facility] - Preparing for Surgery Before surgery, you can play an important role.  Because skin is not sterile, your skin needs to be as free of germs as possible.  You can reduce the number of germs on your skin by washing with CHG (chlorahexidine gluconate) soap before surgery.  CHG is an antiseptic cleaner which kills germs and bonds with the skin to continue killing germs even after washing. Please DO NOT use if you have an allergy to CHG or antibacterial soaps.  If your skin becomes reddened/irritated stop using the CHG and inform your nurse when you arrive at Short Stay. Do not shave (including legs and underarms) for at least 48 hours prior to the first CHG shower.  You may shave your face/neck. Please follow these instructions carefully:  1.  Shower with CHG Soap the night before surgery and the  morning of Surgery.  2.  If you choose to wash your hair, wash your hair first as usual with your  normal  shampoo.  3.  After you shampoo, rinse your hair and body thoroughly to remove the  shampoo.                                        4.  Use CHG as you would any other  liquid soap.  You can apply chg directly  to the skin and wash                       Gently with a scrungie or clean washcloth.  5.  Apply the CHG Soap to your body ONLY FROM THE NECK DOWN.   Do not use on face/ open                           Wound or open sores. Avoid contact with eyes, ears mouth and genitals (private parts).                       Wash face,  Genitals (private parts) with your normal soap.             6.  Wash thoroughly, paying special attention to the area where your surgery  will be performed.  7.  Thoroughly rinse your body with warm water from the neck down.  8.  DO NOT shower/wash with your normal soap after using and rinsing off  the CHG Soap.             9.  Pat yourself dry with a clean towel.            10.  Wear clean pajamas.            11.  Place clean sheets on your bed the night of your first shower and do not  sleep with pets. Day of Surgery : Do not apply any lotions/deodorants the morning of surgery.  Please wear clean clothes to the hospital/surgery center.  FAILURE TO FOLLOW THESE INSTRUCTIONS MAY RESULT IN THE CANCELLATION OF YOUR SURGERY PATIENT SIGNATURE_________________________________  NURSE SIGNATURE__________________________________  ________________________________________________________________________  WHAT IS A BLOOD TRANSFUSION? Blood Transfusion Information  A transfusion is the replacement of blood or some of its parts. Blood is made up of multiple cells which provide different functions.  Red blood cells carry oxygen and are used for blood loss replacement.  White blood cells fight against infection.  Platelets control bleeding.  Plasma helps clot blood.  Other blood products are available for specialized needs, such as hemophilia or other clotting disorders. BEFORE THE TRANSFUSION  Who gives blood for transfusions?   Healthy volunteers who are fully evaluated to make sure their blood is safe. This is blood bank  blood. Transfusion therapy is the safest it has ever been in the practice of medicine. Before blood is taken from a donor, a complete history is taken to make sure that person has no history of diseases nor engages in risky social behavior (examples are intravenous drug use or sexual activity with multiple partners). The donor's travel history is screened to minimize risk of transmitting infections, such as malaria. The donated blood is tested for signs of infectious diseases, such as HIV and hepatitis. The blood is then tested to  be sure it is compatible with you in order to minimize the chance of a transfusion reaction. If you or a relative donates blood, this is often done in anticipation of surgery and is not appropriate for emergency situations. It takes many days to process the donated blood. RISKS AND COMPLICATIONS Although transfusion therapy is very safe and saves many lives, the main dangers of transfusion include:   Getting an infectious disease.  Developing a transfusion reaction. This is an allergic reaction to something in the blood you were given. Every precaution is taken to prevent this. The decision to have a blood transfusion has been considered carefully by your caregiver before blood is given. Blood is not given unless the benefits outweigh the risks. AFTER THE TRANSFUSION  Right after receiving a blood transfusion, you will usually feel much better and more energetic. This is especially true if your red blood cells have gotten low (anemic). The transfusion raises the level of the red blood cells which carry oxygen, and this usually causes an energy increase.  The nurse administering the transfusion will monitor you carefully for complications. HOME CARE INSTRUCTIONS  No special instructions are needed after a transfusion. You may find your energy is better. Speak with your caregiver about any limitations on activity for underlying diseases you may have. SEEK MEDICAL CARE IF:    Your condition is not improving after your transfusion.  You develop redness or irritation at the intravenous (IV) site. SEEK IMMEDIATE MEDICAL CARE IF:  Any of the following symptoms occur over the next 12 hours:  Shaking chills.  You have a temperature by mouth above 102 F (38.9 C), not controlled by medicine.  Chest, back, or muscle pain.  People around you feel you are not acting correctly or are confused.  Shortness of breath or difficulty breathing.  Dizziness and fainting.  You get a rash or develop hives.  You have a decrease in urine output.  Your urine turns a dark color or changes to pink, red, or brown. Any of the following symptoms occur over the next 10 days:  You have a temperature by mouth above 102 F (38.9 C), not controlled by medicine.  Shortness of breath.  Weakness after normal activity.  The white part of the eye turns yellow (jaundice).  You have a decrease in the amount of urine or are urinating less often.  Your urine turns a dark color or changes to pink, red, or brown. Document Released: 10/31/2000 Document Revised: 01/26/2012 Document Reviewed: 06/19/2008 [facility] Patient Information 2014 [place], [place].  _______________________________________________________________________

## 2018-08-20 LAB — ABO/RH: ABO/RH(D): A POS

## 2018-08-27 ENCOUNTER — Encounter (HOSPITAL_COMMUNITY): Payer: Self-pay | Admitting: General Practice

## 2018-08-27 ENCOUNTER — Ambulatory Visit (HOSPITAL_COMMUNITY)
Admission: RE | Admit: 2018-08-27 | Discharge: 2018-08-28 | Disposition: A | Discharge status: Home / Self Care | Payer: 59 | Source: Ambulatory Visit | Attending: Obstetrics and Gynecology | Admitting: Obstetrics and Gynecology

## 2018-08-27 ENCOUNTER — Encounter (HOSPITAL_COMMUNITY)
Admission: RE | Disposition: A | Discharge status: Home / Self Care | Payer: Self-pay | Source: Ambulatory Visit | Attending: Obstetrics and Gynecology

## 2018-08-27 ENCOUNTER — Ambulatory Visit (HOSPITAL_COMMUNITY): Payer: 59 | Admitting: Anesthesiology

## 2018-08-27 DIAGNOSIS — G4730 Sleep apnea, unspecified: Secondary | ICD-10-CM | POA: Insufficient documentation

## 2018-08-27 DIAGNOSIS — N920 Excessive and frequent menstruation with regular cycle: Secondary | ICD-10-CM | POA: Diagnosis present

## 2018-08-27 DIAGNOSIS — I10 Essential (primary) hypertension: Secondary | ICD-10-CM | POA: Insufficient documentation

## 2018-08-27 DIAGNOSIS — Z79899 Other long term (current) drug therapy: Secondary | ICD-10-CM | POA: Diagnosis not present

## 2018-08-27 DIAGNOSIS — N736 Female pelvic peritoneal adhesions (postinfective): Secondary | ICD-10-CM | POA: Insufficient documentation

## 2018-08-27 DIAGNOSIS — N7011 Chronic salpingitis: Secondary | ICD-10-CM | POA: Insufficient documentation

## 2018-08-27 DIAGNOSIS — K219 Gastro-esophageal reflux disease without esophagitis: Secondary | ICD-10-CM | POA: Diagnosis not present

## 2018-08-27 DIAGNOSIS — F419 Anxiety disorder, unspecified: Secondary | ICD-10-CM | POA: Diagnosis not present

## 2018-08-27 DIAGNOSIS — N946 Dysmenorrhea, unspecified: Secondary | ICD-10-CM | POA: Diagnosis present

## 2018-08-27 HISTORY — PX: ROBOTIC ASSISTED LAPAROSCOPIC HYSTERECTOMY AND SALPINGECTOMY: SHX6379

## 2018-08-27 LAB — TYPE AND SCREEN
ABO/RH(D): A POS
Antibody Screen: NEGATIVE

## 2018-08-27 SURGERY — XI ROBOTIC ASSISTED LAPAROSCOPIC HYSTERECTOMY AND SALPINGECTOMY
Anesthesia: General | Laterality: Bilateral

## 2018-08-27 MED ORDER — DICYCLOMINE HCL 10 MG PO CAPS
20.0000 mg | ORAL_CAPSULE | Freq: Two times a day (BID) | ORAL | Status: DC
  Administered 2018-08-27: 20 mg via ORAL
  Filled 2018-08-27 (×2): qty 2

## 2018-08-27 MED ORDER — LACTATED RINGERS IV SOLN
Freq: Once | INTRAVENOUS | Status: AC
  Administered 2018-08-27: 12:00:00 via INTRAVENOUS

## 2018-08-27 MED ORDER — ONDANSETRON HCL 4 MG/2ML IJ SOLN
4.0000 mg | Freq: Four times a day (QID) | INTRAMUSCULAR | Status: DC | PRN
  Administered 2018-08-28: 4 mg via INTRAVENOUS
  Filled 2018-08-27: qty 2

## 2018-08-27 MED ORDER — BUPIVACAINE HCL (PF) 0.25 % IJ SOLN
INTRAMUSCULAR | Status: AC
  Filled 2018-08-27: qty 30

## 2018-08-27 MED ORDER — TRAMADOL HCL 50 MG PO TABS
50.0000 mg | ORAL_TABLET | Freq: Four times a day (QID) | ORAL | Status: DC | PRN
  Administered 2018-08-28: 50 mg via ORAL
  Filled 2018-08-27: qty 1

## 2018-08-27 MED ORDER — PROPOFOL 10 MG/ML IV BOLUS
INTRAVENOUS | Status: AC
  Filled 2018-08-27: qty 40

## 2018-08-27 MED ORDER — ROPIVACAINE HCL 5 MG/ML IJ SOLN
INTRAMUSCULAR | Status: AC
  Filled 2018-08-27: qty 30

## 2018-08-27 MED ORDER — CEFAZOLIN SODIUM-DEXTROSE 2-4 GM/100ML-% IV SOLN
2.0000 g | INTRAVENOUS | Status: AC
  Administered 2018-08-27: 2 g via INTRAVENOUS
  Filled 2018-08-27: qty 100

## 2018-08-27 MED ORDER — PROPOFOL 10 MG/ML IV BOLUS
INTRAVENOUS | Status: DC | PRN
  Administered 2018-08-27: 200 mg via INTRAVENOUS

## 2018-08-27 MED ORDER — SODIUM CHLORIDE 0.9 % IJ SOLN
INTRAMUSCULAR | Status: AC
  Filled 2018-08-27: qty 50

## 2018-08-27 MED ORDER — PANTOPRAZOLE SODIUM 40 MG PO TBEC
40.0000 mg | DELAYED_RELEASE_TABLET | Freq: Two times a day (BID) | ORAL | Status: DC
  Administered 2018-08-27: 40 mg via ORAL
  Filled 2018-08-27 (×2): qty 1

## 2018-08-27 MED ORDER — SCOPOLAMINE 1 MG/3DAYS TD PT72
1.0000 | MEDICATED_PATCH | TRANSDERMAL | Status: DC
  Administered 2018-08-27: 1.5 mg via TRANSDERMAL
  Filled 2018-08-27: qty 1

## 2018-08-27 MED ORDER — KETAMINE HCL 10 MG/ML IJ SOLN
INTRAMUSCULAR | Status: DC | PRN
  Administered 2018-08-27: 60 mg via INTRAVENOUS
  Administered 2018-08-27: 10 mg via INTRAVENOUS

## 2018-08-27 MED ORDER — ONDANSETRON HCL 4 MG/2ML IJ SOLN
INTRAMUSCULAR | Status: AC
  Filled 2018-08-27: qty 2

## 2018-08-27 MED ORDER — MEPERIDINE HCL 50 MG/ML IJ SOLN: 6.2500 mg | INTRAMUSCULAR | Status: DC | PRN

## 2018-08-27 MED ORDER — 0.9 % SODIUM CHLORIDE (POUR BTL) OPTIME
TOPICAL | Status: DC | PRN
  Administered 2018-08-27: 1000 mL

## 2018-08-27 MED ORDER — ACETAMINOPHEN 10 MG/ML IV SOLN
INTRAVENOUS | Status: AC
  Filled 2018-08-27: qty 100

## 2018-08-27 MED ORDER — LIDOCAINE 2% (20 MG/ML) 5 ML SYRINGE
INTRAMUSCULAR | Status: AC
  Filled 2018-08-27: qty 5

## 2018-08-27 MED ORDER — OXYCODONE-ACETAMINOPHEN 5-325 MG PO TABS
1.0000 | ORAL_TABLET | ORAL | Status: DC | PRN
  Administered 2018-08-27 – 2018-08-28 (×2): 1 via ORAL
  Filled 2018-08-27 (×2): qty 1

## 2018-08-27 MED ORDER — NALOXONE HCL 0.4 MG/ML IJ SOLN: 0.4000 mg | INTRAMUSCULAR | Status: DC | PRN

## 2018-08-27 MED ORDER — POLYETHYLENE GLYCOL 3350 17 G PO PACK
17.0000 g | PACK | Freq: Every day | ORAL | Status: DC
  Administered 2018-08-27: 17 g via ORAL
  Filled 2018-08-27: qty 1

## 2018-08-27 MED ORDER — KETOROLAC TROMETHAMINE 30 MG/ML IJ SOLN
30.0000 mg | Freq: Once | INTRAMUSCULAR | Status: AC | PRN
  Administered 2018-08-27: 30 mg via INTRAVENOUS

## 2018-08-27 MED ORDER — DEXAMETHASONE SODIUM PHOSPHATE 10 MG/ML IJ SOLN
INTRAMUSCULAR | Status: DC | PRN
  Administered 2018-08-27: 10 mg via INTRAVENOUS

## 2018-08-27 MED ORDER — HYDROMORPHONE HCL 1 MG/ML IJ SOLN
INTRAMUSCULAR | Status: AC
  Filled 2018-08-27: qty 2

## 2018-08-27 MED ORDER — MIDAZOLAM HCL 2 MG/2ML IJ SOLN
INTRAMUSCULAR | Status: DC | PRN
  Administered 2018-08-27: 2 mg via INTRAVENOUS

## 2018-08-27 MED ORDER — MIDAZOLAM HCL 2 MG/2ML IJ SOLN
INTRAMUSCULAR | Status: AC
  Filled 2018-08-27: qty 2

## 2018-08-27 MED ORDER — ROCURONIUM BROMIDE 10 MG/ML (PF) SYRINGE
PREFILLED_SYRINGE | INTRAVENOUS | Status: DC | PRN
  Administered 2018-08-27: 20 mg via INTRAVENOUS
  Administered 2018-08-27: 60 mg via INTRAVENOUS

## 2018-08-27 MED ORDER — HYDROCODONE-ACETAMINOPHEN 7.5-325 MG PO TABS: 1.0000 | ORAL_TABLET | Freq: Once | ORAL | Status: DC | PRN

## 2018-08-27 MED ORDER — ACETAMINOPHEN 10 MG/ML IV SOLN
1000.0000 mg | Freq: Once | INTRAVENOUS | Status: DC | PRN
  Administered 2018-08-27: 1000 mg via INTRAVENOUS

## 2018-08-27 MED ORDER — FENTANYL CITRATE (PF) 250 MCG/5ML IJ SOLN
INTRAMUSCULAR | Status: AC
  Filled 2018-08-27: qty 5

## 2018-08-27 MED ORDER — LIDOCAINE 2% (20 MG/ML) 5 ML SYRINGE
INTRAMUSCULAR | Status: DC | PRN
  Administered 2018-08-27: 1.5 mg/kg/h via INTRAVENOUS

## 2018-08-27 MED ORDER — SODIUM CHLORIDE 0.9 % IJ SOLN
INTRAMUSCULAR | Status: DC | PRN
  Administered 2018-08-27: 10 mL

## 2018-08-27 MED ORDER — HYDROMORPHONE 1 MG/ML IV SOLN
INTRAVENOUS | Status: DC
  Administered 2018-08-27: 0.6 mg via INTRAVENOUS
  Administered 2018-08-27: 1 mg via INTRAVENOUS
  Administered 2018-08-27: 1.4 mg via INTRAVENOUS
  Administered 2018-08-28: 2.8 mg via INTRAVENOUS
  Administered 2018-08-28: 2.2 mg via INTRAVENOUS
  Filled 2018-08-27: qty 25

## 2018-08-27 MED ORDER — PHENAZOPYRIDINE HCL 200 MG PO TABS
200.0000 mg | ORAL_TABLET | Freq: Once | ORAL | Status: AC
  Administered 2018-08-27: 200 mg via ORAL
  Filled 2018-08-27: qty 1

## 2018-08-27 MED ORDER — BUPIVACAINE HCL (PF) 0.25 % IJ SOLN
INTRAMUSCULAR | Status: DC | PRN
  Administered 2018-08-27: 30 mL

## 2018-08-27 MED ORDER — SODIUM CHLORIDE 0.9 % IR SOLN
Status: DC | PRN
  Administered 2018-08-27: 3000 mL

## 2018-08-27 MED ORDER — DEXTROSE IN LACTATED RINGERS 5 % IV SOLN
INTRAVENOUS | Status: DC
  Administered 2018-08-27 – 2018-08-28 (×2): via INTRAVENOUS

## 2018-08-27 MED ORDER — VENLAFAXINE HCL ER 150 MG PO CP24
300.0000 mg | ORAL_CAPSULE | Freq: Every day | ORAL | Status: DC
  Administered 2018-08-28: 300 mg via ORAL
  Filled 2018-08-27: qty 2

## 2018-08-27 MED ORDER — DEXAMETHASONE SODIUM PHOSPHATE 10 MG/ML IJ SOLN
INTRAMUSCULAR | Status: AC
  Filled 2018-08-27: qty 1

## 2018-08-27 MED ORDER — KETAMINE HCL 10 MG/ML IJ SOLN
INTRAMUSCULAR | Status: AC
  Filled 2018-08-27: qty 1

## 2018-08-27 MED ORDER — PROMETHAZINE HCL 25 MG/ML IJ SOLN
INTRAMUSCULAR | Status: AC
  Filled 2018-08-27: qty 1

## 2018-08-27 MED ORDER — KETOROLAC TROMETHAMINE 30 MG/ML IJ SOLN
INTRAMUSCULAR | Status: AC
  Filled 2018-08-27: qty 1

## 2018-08-27 MED ORDER — MAGNESIUM OXIDE 400 (241.3 MG) MG PO TABS
1200.0000 mg | ORAL_TABLET | Freq: Every day | ORAL | Status: DC
  Administered 2018-08-27: 1200 mg via ORAL
  Filled 2018-08-27: qty 3

## 2018-08-27 MED ORDER — ACETAMINOPHEN 500 MG PO TABS
1000.0000 mg | ORAL_TABLET | ORAL | Status: AC
  Administered 2018-08-27: 1000 mg via ORAL
  Filled 2018-08-27: qty 2

## 2018-08-27 MED ORDER — DIPHENHYDRAMINE HCL 50 MG/ML IJ SOLN: 12.5000 mg | Freq: Four times a day (QID) | INTRAMUSCULAR | Status: DC | PRN

## 2018-08-27 MED ORDER — SODIUM CHLORIDE 0.9% FLUSH: 9.0000 mL | INTRAVENOUS | Status: DC | PRN

## 2018-08-27 MED ORDER — SUGAMMADEX SODIUM 200 MG/2ML IV SOLN
INTRAVENOUS | Status: DC | PRN
  Administered 2018-08-27: 200 mg via INTRAVENOUS

## 2018-08-27 MED ORDER — GABAPENTIN 300 MG PO CAPS
300.0000 mg | ORAL_CAPSULE | ORAL | Status: AC
  Administered 2018-08-27: 300 mg via ORAL
  Filled 2018-08-27: qty 1

## 2018-08-27 MED ORDER — GABAPENTIN 400 MG PO CAPS
1200.0000 mg | ORAL_CAPSULE | Freq: Every day | ORAL | Status: DC
  Administered 2018-08-27: 1200 mg via ORAL
  Filled 2018-08-27: qty 3

## 2018-08-27 MED ORDER — HYDROMORPHONE HCL 1 MG/ML IJ SOLN
0.2500 mg | INTRAMUSCULAR | Status: DC | PRN
  Administered 2018-08-27 (×4): 0.5 mg via INTRAVENOUS

## 2018-08-27 MED ORDER — ROCURONIUM BROMIDE 100 MG/10ML IV SOLN
INTRAVENOUS | Status: AC
  Filled 2018-08-27: qty 1

## 2018-08-27 MED ORDER — FENTANYL CITRATE (PF) 100 MCG/2ML IJ SOLN
INTRAMUSCULAR | Status: DC | PRN
  Administered 2018-08-27 (×2): 50 ug via INTRAVENOUS
  Administered 2018-08-27: 100 ug via INTRAVENOUS
  Administered 2018-08-27: 50 ug via INTRAVENOUS

## 2018-08-27 MED ORDER — LIDOCAINE 2% (20 MG/ML) 5 ML SYRINGE
INTRAMUSCULAR | Status: DC | PRN
  Administered 2018-08-27: 40 mg via INTRAVENOUS

## 2018-08-27 MED ORDER — OXYCODONE-ACETAMINOPHEN 10-325 MG PO TABS: 1.0000 | ORAL_TABLET | ORAL | Status: DC | PRN

## 2018-08-27 MED ORDER — DIPHENHYDRAMINE HCL 12.5 MG/5ML PO ELIX: 12.5000 mg | ORAL_SOLUTION | Freq: Four times a day (QID) | ORAL | Status: DC | PRN

## 2018-08-27 MED ORDER — PROMETHAZINE HCL 25 MG/ML IJ SOLN
6.2500 mg | INTRAMUSCULAR | Status: DC | PRN
  Administered 2018-08-27: 12.5 mg via INTRAVENOUS

## 2018-08-27 MED ORDER — MONTELUKAST SODIUM 10 MG PO TABS
10.0000 mg | ORAL_TABLET | Freq: Every day | ORAL | Status: DC
  Administered 2018-08-27: 10 mg via ORAL
  Filled 2018-08-27: qty 1

## 2018-08-27 MED ORDER — HYDROMORPHONE HCL 1 MG/ML IJ SOLN
0.2500 mg | INTRAMUSCULAR | Status: DC | PRN
  Administered 2018-08-27 (×2): 0.5 mg via INTRAVENOUS

## 2018-08-27 MED ORDER — LACTATED RINGERS IV SOLN
INTRAVENOUS | Status: DC | PRN
  Administered 2018-08-27: 12:00:00 via INTRAVENOUS

## 2018-08-27 SURGICAL SUPPLY — 53 items
ADH SKN CLS APL DERMABOND .7 (GAUZE/BANDAGES/DRESSINGS) ×1
APL SRG 38 LTWT LNG FL B (MISCELLANEOUS) ×1
APPLICATOR ARISTA FLEXITIP XL (MISCELLANEOUS) ×1 IMPLANT
BARRIER ADHS 3X4 INTERCEED (GAUZE/BANDAGES/DRESSINGS) IMPLANT
BRR ADH 4X3 ABS CNTRL BYND (GAUZE/BANDAGES/DRESSINGS)
CANISTER SUCT 3000ML PPV (MISCELLANEOUS) ×2 IMPLANT
CATH FOLEY 3WAY  5CC 16FR (CATHETERS) ×1
CATH FOLEY 3WAY 5CC 16FR (CATHETERS) ×1 IMPLANT
COVER BACK TABLE 60X90IN (DRAPES) ×2 IMPLANT
COVER TIP SHEARS 8 DVNC (MISCELLANEOUS) ×1 IMPLANT
COVER TIP SHEARS 8MM DA VINCI (MISCELLANEOUS) ×1
COVER WAND RF STERILE (DRAPES) IMPLANT
DECANTER SPIKE VIAL GLASS SM (MISCELLANEOUS) ×4 IMPLANT
DEFOGGER SCOPE WARMER CLEARIFY (MISCELLANEOUS) ×2 IMPLANT
DERMABOND ADVANCED (GAUZE/BANDAGES/DRESSINGS) ×1
DERMABOND ADVANCED .7 DNX12 (GAUZE/BANDAGES/DRESSINGS) ×1 IMPLANT
DRAPE ARM DVNC X/XI (DISPOSABLE) ×4 IMPLANT
DRAPE COLUMN DVNC XI (DISPOSABLE) ×1 IMPLANT
DRAPE DA VINCI XI ARM (DISPOSABLE) ×4
DRAPE DA VINCI XI COLUMN (DISPOSABLE) ×1
DURAPREP 26ML APPLICATOR (WOUND CARE) ×2 IMPLANT
ELECT REM PT RETURN 15FT ADLT (MISCELLANEOUS) ×2 IMPLANT
GLOVE BIO SURGEON STRL SZ7.5 (GLOVE) ×6 IMPLANT
GLOVE BIOGEL PI IND STRL 7.0 (GLOVE) ×2 IMPLANT
GLOVE BIOGEL PI INDICATOR 7.0 (GLOVE) ×2
HEMOSTAT ARISTA ABSORB 3G PWDR (MISCELLANEOUS) ×1 IMPLANT
IRRIG SUCT STRYKERFLOW 2 WTIP (MISCELLANEOUS) ×2
IRRIGATION SUCT STRKRFLW 2 WTP (MISCELLANEOUS) ×1 IMPLANT
NEEDLE INSUFFLATION 150MM (ENDOMECHANICALS) ×2 IMPLANT
OBTURATOR OPTICAL STANDARD 8MM (TROCAR) ×1
OBTURATOR OPTICAL STND 8 DVNC (TROCAR) ×1
OBTURATOR OPTICALSTD 8 DVNC (TROCAR) IMPLANT
OCCLUDER COLPOPNEUMO (BALLOONS) ×2 IMPLANT
PACK ROBOT WH (CUSTOM PROCEDURE TRAY) ×2 IMPLANT
PACK ROBOTIC GOWN (GOWN DISPOSABLE) ×2 IMPLANT
PACK TRENDGUARD 450 HYBRID PRO (MISCELLANEOUS) IMPLANT
PAD PREP 24X48 CUFFED NSTRL (MISCELLANEOUS) ×2 IMPLANT
POSITIONER SURGICAL ARM (MISCELLANEOUS) ×4 IMPLANT
SEAL CANN UNIV 5-8 DVNC XI (MISCELLANEOUS) ×3 IMPLANT
SEAL XI 5MM-8MM UNIVERSAL (MISCELLANEOUS) ×3
SET CYSTO W/LG BORE CLAMP LF (SET/KITS/TRAYS/PACK) IMPLANT
SET TRI-LUMEN FLTR TB AIRSEAL (TUBING) ×2 IMPLANT
SUT VIC AB 0 CT1 27 (SUTURE) ×4
SUT VIC AB 0 CT1 27XBRD ANBCTR (SUTURE) ×2 IMPLANT
SUT VICRYL 0 UR6 27IN ABS (SUTURE) ×2 IMPLANT
SUT VICRYL RAPIDE 4/0 PS 2 (SUTURE) ×4 IMPLANT
SUT VLOC 180 0 9IN  GS21 (SUTURE)
SUT VLOC 180 0 9IN GS21 (SUTURE) IMPLANT
TIP UTERINE 6.7X8CM BLUE DISP (MISCELLANEOUS) ×1 IMPLANT
TOWEL OR 17X26 10 PK STRL BLUE (TOWEL DISPOSABLE) ×4 IMPLANT
TRENDGUARD 450 HYBRID PRO PACK (MISCELLANEOUS) ×2
TROCAR PORT AIRSEAL 8X120 (TROCAR) ×2 IMPLANT
WATER STERILE IRR 1000ML POUR (IV SOLUTION) ×2 IMPLANT

## 2018-08-27 NOTE — Anesthesia Procedure Notes (Signed)
Procedure Name: Intubation Date/Time: 08/27/2018 1:07 PM Performed by: Wanita Chamberlain, CRNA Pre-anesthesia Checklist: Patient identified, Emergency Drugs available, Suction available, Patient being monitored and Timeout performed Patient Re-evaluated:Patient Re-evaluated prior to induction Oxygen Delivery Method: Circle system utilized Preoxygenation: Pre-oxygenation with 100% oxygen Induction Type: IV induction Ventilation: Mask ventilation without difficulty Laryngoscope Size: Mac and 4 Grade View: Grade I Tube type: Oral Tube size: 7.0 mm Number of attempts: 1 Airway Equipment and Method: Stylet Placement Confirmation: breath sounds checked- equal and bilateral,  CO2 detector,  positive ETCO2 and ETT inserted through vocal cords under direct vision Secured at: 22 cm Tube secured with: Tape Dental Injury: Teeth and Oropharynx as per pre-operative assessment

## 2018-08-27 NOTE — Anesthesia Preprocedure Evaluation (Addendum)
Anesthesia Evaluation  Patient identified by MRN, date of birth, ID band Patient awake    Reviewed: Allergy & Precautions, NPO status , Patient's Chart, lab work & pertinent test results  History of Anesthesia Complications (+) PONV  Airway Mallampati: II  TM Distance: >3 FB Neck ROM: Full    Dental no notable dental hx. (+) Dental Advisory Given, Partial Upper, Poor Dentition,    Pulmonary sleep apnea , Current Smoker,    Pulmonary exam normal breath sounds clear to auscultation       Cardiovascular hypertension, Normal cardiovascular exam Rhythm:Regular Rate:Normal     Neuro/Psych  Headaches, Anxiety    GI/Hepatic PUD, GERD  ,  Endo/Other    Renal/GU      Musculoskeletal  (+) Arthritis ,   Abdominal (+) + obese,   Peds  Hematology  (+) Blood dyscrasia, anemia ,   Anesthesia Other Findings   Reproductive/Obstetrics                            Lab Results  Component Value Date   WBC 7.4 08/19/2018   HGB 12.1 08/19/2018   HCT 37.6 08/19/2018   MCV 90.0 08/19/2018   PLT 345 08/19/2018    Anesthesia Physical Anesthesia Plan  ASA: II  Anesthesia Plan: General   Post-op Pain Management:    Induction: Intravenous  PONV Risk Score and Plan: 3 and Treatment may vary due to age or medical condition, Ondansetron, Dexamethasone and Scopolamine patch - Pre-op  Airway Management Planned: Oral ETT  Additional Equipment:   Intra-op Plan:   Post-operative Plan: Extubation in OR  Informed Consent: I have reviewed the patients History and Physical, chart, labs and discussed the procedure including the risks, benefits and alternatives for the proposed anesthesia with the patient or authorized representative who has indicated his/her understanding and acceptance.   Dental advisory given  Plan Discussed with: CRNA  Anesthesia Plan Comments:         Anesthesia Quick  Evaluation

## 2018-08-27 NOTE — H&P (Signed)
NAMESAFAA, STINGLEY MEDICAL RECORD [id number] ACCOUNT [id number] DATE OF BIRTH:09-17-1981 FACILITY: [facility] LOCATION: [facility] PHYSICIAN:Kassondra Geil J. Billy Coast, MD  HISTORY AND PHYSICAL  DATE OF ADMISSION:  08/27/2018  PREOPERATIVE DIAGNOSIS:  Dysmenorrhea and menorrhagia for definitive therapy.  HISTORY OF PRESENT ILLNESS:  A 37 year old white female, G2 P2, with a known history of endometriosis presents with dysmenorrhea and menorrhagia for definitive therapy.  She has a history of vaginal delivery x1, C-section x1.  SOCIAL HISTORY:  She is an occasional smoker, nondrinker.  Denies domestic physical violence.  FAMILY HISTORY:  Depression, alcohol abuse, prostate cancer, COPD, rheumatoid arthritis, ovarian, lung cancer, diabetes, melanoma, hypertension, and breast cancer.  PAST SURGICAL HISTORY:  Remarkable for dilatation and esophageal stricture, knee surgery, appendectomy, LEEP, cesarean delivery, laparoscopy for endometriosis and a D and C.  PREGNANCY HISTORY:  As noted.  PHYSICAL EXAMINATION: GENERAL:  Well-developed, well-nourished white female in no acute distress. HEENT:  Normal. NECK:  Supple, full range of motion. LUNGS:  Clear. HEART:  Regular rhythm. ABDOMEN:  Soft, nontender. PELVIC:  Reveals an anteflexed uterus and no adnexal masses. EXTREMITIES:  There are no cords. NEUROLOGIC:  Nonfocal. SKIN:  Intact.  IMPRESSION:  Severe dysmenorrhea, menorrhagia with a history of endometriosis for definitive therapy.  PLAN:  Proceed with da Vinci assisted total laparoscopic hysterectomy, bilateral salpingectomy, ablation, excision of endometriosis.  Risks of anesthesia, infection, bleeding, injury to surrounding organs, possible need for repair discussed.  Delayed  risks and complications including bowel and bladder injury noted.  The patient acknowledged and wished to proceed.  LN/NUANCE  D:08/26/2018 T:08/26/2018 JOB:[id number]

## 2018-08-27 NOTE — Op Note (Signed)
08/27/2018  3:17 PM  PATIENT:  Diane [name]  37 y.o. female  PRE-OPERATIVE DIAGNOSIS:  Menorrhagia Dysmenorrhea  POST-OPERATIVE DIAGNOSIS:  Menorrhagia Dysmenorrhea  PROCEDURE:  Procedure(s): XI ROBOTIC ASSISTED LAPAROSCOPIC HYSTERECTOMY  BILATERAL SALPINGECTOMY LYSIS OF ANTERIOR CUL DE SAC ADHESIONS, LYSIS OF LEFT ADNEXAL ADHESIONS ABLATION OF CUL DE SAC ENDOMETRIOSIS MCCALL CUL DE PLASTY  SURGEON:  Surgeon(s): Olivia Mackie, MD Maxie Better, MD  ASSISTANTS: COUSINS, MD   ANESTHESIA:   local and general  ESTIMATED BLOOD LOSS: 75 mL   DRAINS: Urinary Catheter (Foley)   LOCAL MEDICATIONS USED:  MARCAINE    and Amount: 20 ml  SPECIMEN:  Source of Specimen:  UTERUS , CERVIX, TUBES  DISPOSITION OF SPECIMEN:  PATHOLOGY  COUNTS:  YES  DICTATION #: [id number]  PLAN OF CARE: DC HOME IN AM  PATIENT DISPOSITION:  PACU - hemodynamically stable.

## 2018-08-27 NOTE — Transfer of Care (Signed)
Immediate Anesthesia Transfer of Care Note  Patient: Diane [name]  Procedure(s) Performed: XI ROBOTIC ASSISTED LAPAROSCOPIC HYSTERECTOMY AND BILATERAL SALPINGECTOMY (Bilateral )  Patient Location: PACU  Anesthesia Type:General  Level of Consciousness: awake, alert  and oriented  Airway & Oxygen Therapy: Patient Spontanous Breathing and Patient connected to face mask oxygen  Post-op Assessment: Report given to RN and Post -op Vital signs reviewed and stable  Post vital signs: Reviewed and stable  Last Vitals:  Vitals Value Taken Time  BP    Temp    Pulse 91 08/27/2018  3:22 PM  Resp 21 08/27/2018  3:22 PM  SpO2 100 % 08/27/2018  3:22 PM  Vitals shown include unvalidated device data.  Last Pain:  Vitals:   08/27/18 1142  TempSrc: Oral  PainSc: 7          Complications: No apparent anesthesia complications

## 2018-08-27 NOTE — Progress Notes (Signed)
Patient seen and examined. Consent witnessed and signed. No changes noted. Update completed. CBC    Component Value Date/Time   WBC 7.4 08/19/2018 1211   RBC 4.18 08/19/2018 1211   HGB 12.1 08/19/2018 1211   HCT 37.6 08/19/2018 1211   PLT 345 08/19/2018 1211   MCV 90.0 08/19/2018 1211   MCH 28.9 08/19/2018 1211   MCHC 32.2 08/19/2018 1211   RDW 13.0 08/19/2018 1211   LYMPHSABS 2.0 04/21/2017 1140   MONOABS 0.5 04/21/2017 1140   EOSABS 0.6 04/21/2017 1140   BASOSABS 0.1 04/21/2017 1140   BP (!) 137/97   Pulse 99   Temp 99 F (37.2 C) (Oral)   Resp 20   Ht 5\' 7"  (1.702 m)   Wt 96.8 kg   LMP 08/19/2018 (Exact Date)   SpO2 98%   BMI 33.42 kg/m

## 2018-08-28 ENCOUNTER — Other Ambulatory Visit: Payer: Self-pay

## 2018-08-28 ENCOUNTER — Encounter (HOSPITAL_COMMUNITY): Payer: Self-pay | Admitting: Obstetrics and Gynecology

## 2018-08-28 DIAGNOSIS — N736 Female pelvic peritoneal adhesions (postinfective): Secondary | ICD-10-CM | POA: Diagnosis not present

## 2018-08-28 LAB — BASIC METABOLIC PANEL
Anion gap: 8 (ref 5–15)
BUN: 10 mg/dL (ref 6–20)
CHLORIDE: 101 mmol/L (ref 98–111)
CO2: 26 mmol/L (ref 22–32)
Calcium: 8.9 mg/dL (ref 8.9–10.3)
Creatinine, Ser: 0.65 mg/dL (ref 0.44–1.00)
GFR calc Af Amer: >60 mL/min (ref >60)
GFR calc non Af Amer: >60 mL/min (ref >60)
GLUCOSE: 162 mg/dL — AB (ref 70–99)
POTASSIUM: 4.3 mmol/L (ref 3.5–5.1)
Sodium: 135 mmol/L (ref 135–145)

## 2018-08-28 LAB — CBC
HCT: 35.1 % — ABNORMAL LOW (ref 36.0–46.0)
HEMOGLOBIN: 10.6 g/dL — AB (ref 12.0–15.0)
MCH: 28.2 pg (ref 26.0–34.0)
MCHC: 30.2 g/dL (ref 30.0–36.0)
MCV: 93.4 fL (ref 80.0–100.0)
Platelets: 232 10*3/uL (ref 150–400)
RBC: 3.76 MIL/uL — AB (ref 3.87–5.11)
RDW: 12.7 % (ref 11.5–15.5)
WBC: 9.9 10*3/uL (ref 4.0–10.5)
nRBC: 0 % (ref 0.0–0.2)

## 2018-08-28 MED ORDER — OXYCODONE-ACETAMINOPHEN 5-325 MG PO TABS: 1.0000 | ORAL_TABLET | ORAL | Status: DC | PRN

## 2018-08-28 MED ORDER — TRAMADOL HCL 50 MG PO TABS: 50.0000 mg | ORAL_TABLET | Freq: Four times a day (QID) | ORAL | 0 refills | Status: DC | PRN

## 2018-08-28 MED ORDER — OXYCODONE-ACETAMINOPHEN 5-325 MG PO TABS: 1.0000 | ORAL_TABLET | Freq: Four times a day (QID) | ORAL | 0 refills | Status: DC | PRN

## 2018-08-28 NOTE — Plan of Care (Signed)
D/C instructions reviewed with patient and copy given for records. IV removed. Prescriptions given. Patient ready for discharge.

## 2018-08-28 NOTE — Progress Notes (Addendum)
1 Day Post-Op Procedure(s) (LRB): XI ROBOTIC ASSISTED LAPAROSCOPIC HYSTERECTOMY AND BILATERAL SALPINGECTOMY (Bilateral)  Subjective: Patient reports nausea, incisional pain, tolerating PO, + flatus, + BM and no problems voiding.    Objective: BP 123/71 (BP Location: Left Arm)   Pulse 75   Temp 98.1 F (36.7 C) (Oral)   Resp 20   Ht 5\' 7"  (1.702 m)   Wt 96.8 kg   LMP 08/19/2018 (Exact Date)   SpO2 96%   BMI 33.42 kg/m   CBC    Component Value Date/Time   WBC 9.9 08/28/2018 0453   RBC 3.76 (L) 08/28/2018 0453   HGB 10.6 (L) 08/28/2018 0453   HCT 35.1 (L) 08/28/2018 0453   PLT 232 08/28/2018 0453   MCV 93.4 08/28/2018 0453   MCH 28.2 08/28/2018 0453   MCHC 30.2 08/28/2018 0453   RDW 12.7 08/28/2018 0453   LYMPHSABS 2.0 04/21/2017 1140   MONOABS 0.5 04/21/2017 1140   EOSABS 0.6 04/21/2017 1140   BASOSABS 0.1 04/21/2017 1140    I have reviewed patient's vital signs, intake and output, medications and labs.  General: alert, cooperative and appears stated age Resp: clear to auscultation bilaterally and normal percussion bilaterally Cardio: regular rate and rhythm, S1, S2 normal, no murmur, click, rub or gallop GI: soft, non-tender; bowel sounds normal; no masses,  no organomegaly and incision: clean, dry and intact Extremities: extremities normal, atraumatic, no cyanosis or edema Vaginal Bleeding: minimal  Assessment: s/p Procedure(s) with comments: XI ROBOTIC ASSISTED LAPAROSCOPIC HYSTERECTOMY AND BILATERAL SALPINGECTOMY (Bilateral) - [facility] for 23hr OBS: stable, progressing well and tolerating diet  Plan: Advance diet Discontinue IV fluids Discharge home  LOS: 0 days    Norberta Stobaugh J 08/28/2018, 9:31 AM

## 2018-08-28 NOTE — Op Note (Signed)
Diane [name], Diane [name] MEDICAL RECORD NW:[id number] ACCOUNT [id number] DATE OF BIRTH:10-Feb-1981 FACILITY: [facility] LOCATION: [facility] PHYSICIAN:Lea Walbert J. Billy Coast, MD  OPERATIVE REPORT  DATE OF PROCEDURE:  08/28/2018  PREOPERATIVE DIAGNOSES:  Menorrhagia, dysmenorrhea.  POSTOPERATIVE DIAGNOSES:   1.  Menorrhagia, dysmenorrhea plus enterocele.   2.  Bilateral hydrosalpinx. 3.  Left adnexal adhesions, anterior cul-de-sac adhesions.  PROCEDURES:   1.  Da Vinci total laparoscopic hysterectomy. 2.  Bilateral salpingectomy. 3.  Lysis of left sigmoid to the left adnexal adhesions, lysis of dense anterior cul-de-sac adhesions.   4.  Ablation of posterior cul-de-sac endometriosis.   5.  McCall culdoplasty.  SURGEON:  Olivia Mackie, MD  ASSISTANTCherly Hensen.  ANESTHESIA:  General and local.  ESTIMATED BLOOD LOSS:  Less than 50 mL.  COMPLICATIONS:  None.  DRAINS:  Foley.  COUNTS:  Correct.  DISPOSITION:  The patient was taken to the recovery in good condition.  FINDINGS:  Mild endometriosis, bilateral hydrosalpinx, dense bladder adhesions, left adnexal adhesions.  Normal uterus, normal ovaries.  BRIEF OPERATIVE NOTE:  After being apprised of the risks of anesthesia, infection, bleeding, injury to surrounding organs, possible need for repair, delayed versus immediate complications including bowel and bladder, internal vessel injury, possible need  for repair, inability to cure pain, the patient was brought to the operating room where she was administered general anesthetic without complications.  Prepped and draped in usual sterile fashion, catheterized with a Foley catheter placed.  RUMI  retractor placed vaginally without difficulty.  Exam under anesthesia revealing an anteflexed, normal size uterus without adnexal masses.  At this time, infraumbilical incision made with a scalpel in the area of previous appendectomy incision.  Veress  needle placed.  Opening pressure -2.  Four liters of  CO2 insufflated without difficulty.  Trocar placed atraumatically.  Pictures taken.  Normal liver, gallbladder.  Appendix not seen.  Adhesions in the anterior cul-de-sac are dense of the bladder to the  upper mid fundal area and left adnexal adhesions to the sigmoid were noted.  Bilateral hydrosalpinges and noted as well with adhesions to the tubes as well on the left side.  These adhesions are 2 trocar sites placed on the left, 1 for the AirSeal was  established without difficulty and 2 robotic ports, 1 on the left and 1 the right.  Robotic docking after establishing deep Trendelenburg position and the long bipolar forceps and EndoShears were placed.  At this time, the adhesions in the left adnexa  are excised.  The left hydrosalpinx was excised by cauterizing along the mesosalpinx and the right tube, which was retracted through the AirSeal port.  Similarly done on the right side where a right hydrosalpinx was removed in similar fashion.  The  adhesions are then revealed and otherwise normal left adnexa.  The retroperitoneal space was difficult to enter cephalad to the round ligaments.  Therefore, the tubo-ovarian ligament was cauterized and divided.  The round ligament was cauterized and  divided.  The ureter was identified.  The bladder flap was then carefully developed sharply.  Please note that Pyridium was given to the patient prior to procedure.  No bladder entry is noted and no evidence of bowel injury as the bladder is sharply  dissected off the mid fundal uterus and down to the level of the cervical cup with extensive adhesiolysis during the process.  The left uterine vessels were then skeletonized and cut.  On the right side right ovarian ligament was cauterized and cut.  The  round ligament  was opened and the retroperitoneal space was entered.  The ureter was identified.  The right uterine vessels were skeletonized, cauterized with bipolar cautery and cut.  The cervicovaginal junction was better  delineated using sharp and  blunt dissection.  The specimen was then detached the cervicovaginal junction and extracted through the vagina.  The cul-de-sac endometriosis was ablated using unipolar cautery without difficulty.  The vagina was closed in 2 running layers of a 0 V-Loc  suture and a McCall culdoplasty suture placed in an abdominal fashion.  At this time, irrigation was accomplished.  There is evidence of a little bit of oozing at the right angle.  The hemostatic powder was placed for hemostasis.  Repeat hemostasis was  noted.  CO2 was released and good hemostasis was assured.  All trocars were then removed under direct visualization.  CO2 was released.  Positive pressure applied.  Incisions are closed using 0 Vicryl, 4-0 Vicryl, Dermabond.  Vaginal exam reveals the  vagina to be, well-supported and closed with the cuff intact.  Urine was orang with no evidence of hematuria.    The patient tolerated the procedure well, was awakened and transferred to recovery in good condition.  AN/NUANCE  D:08/28/2018 T:08/28/2018 JOB:[id number]

## 2018-08-29 NOTE — Anesthesia Postprocedure Evaluation (Signed)
Anesthesia Post Note  Patient: Diane [name]  Procedure(s) Performed: XI ROBOTIC ASSISTED LAPAROSCOPIC HYSTERECTOMY AND BILATERAL SALPINGECTOMY (Bilateral )     Patient location during evaluation: PACU Anesthesia Type: General Level of consciousness: awake and alert Pain management: pain level controlled Vital Signs Assessment: post-procedure vital signs reviewed and stable Respiratory status: spontaneous breathing, nonlabored ventilation, respiratory function stable and patient connected to nasal cannula oxygen Cardiovascular status: blood pressure returned to baseline and stable Postop Assessment: no apparent nausea or vomiting Anesthetic complications: no    Last Vitals:  Vitals:   08/28/18 0541 08/28/18 0542  BP: 123/71   Pulse: 75   Resp: 17 20  Temp: 36.7 C   SpO2: 97% 96%    Last Pain:  Vitals:   08/28/18 0824  TempSrc:   PainSc: 6                  Trevor Iha

## 2018-10-26 IMAGING — DX DG CHEST 2V
2 series · 2 of 2 positions shown · non-contrast
Comparison: 08/22/2016 chest radiograph.

CLINICAL DATA: Cough for 1 month

EXAM:
CHEST  2 VIEW

[chest pa]
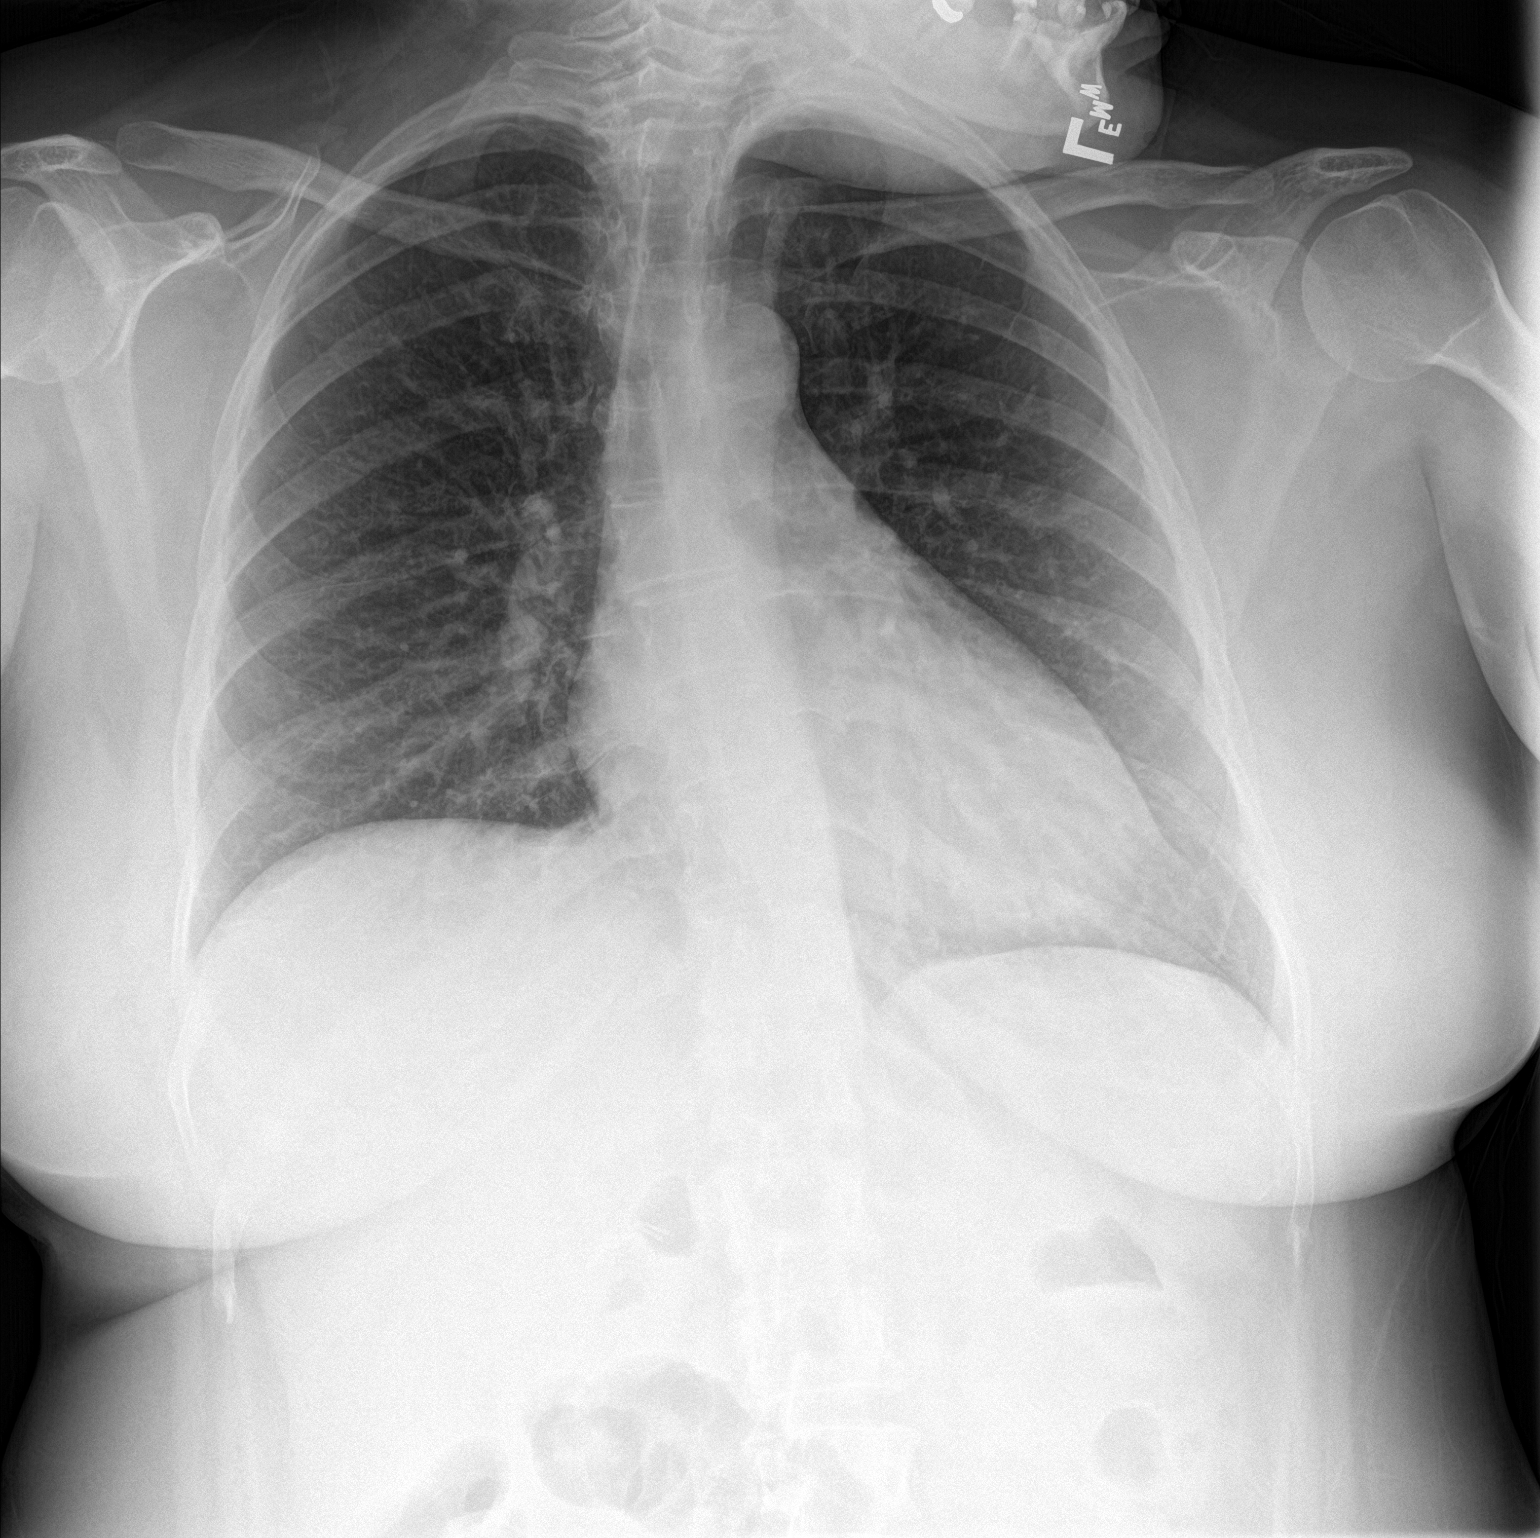

[chest lat]
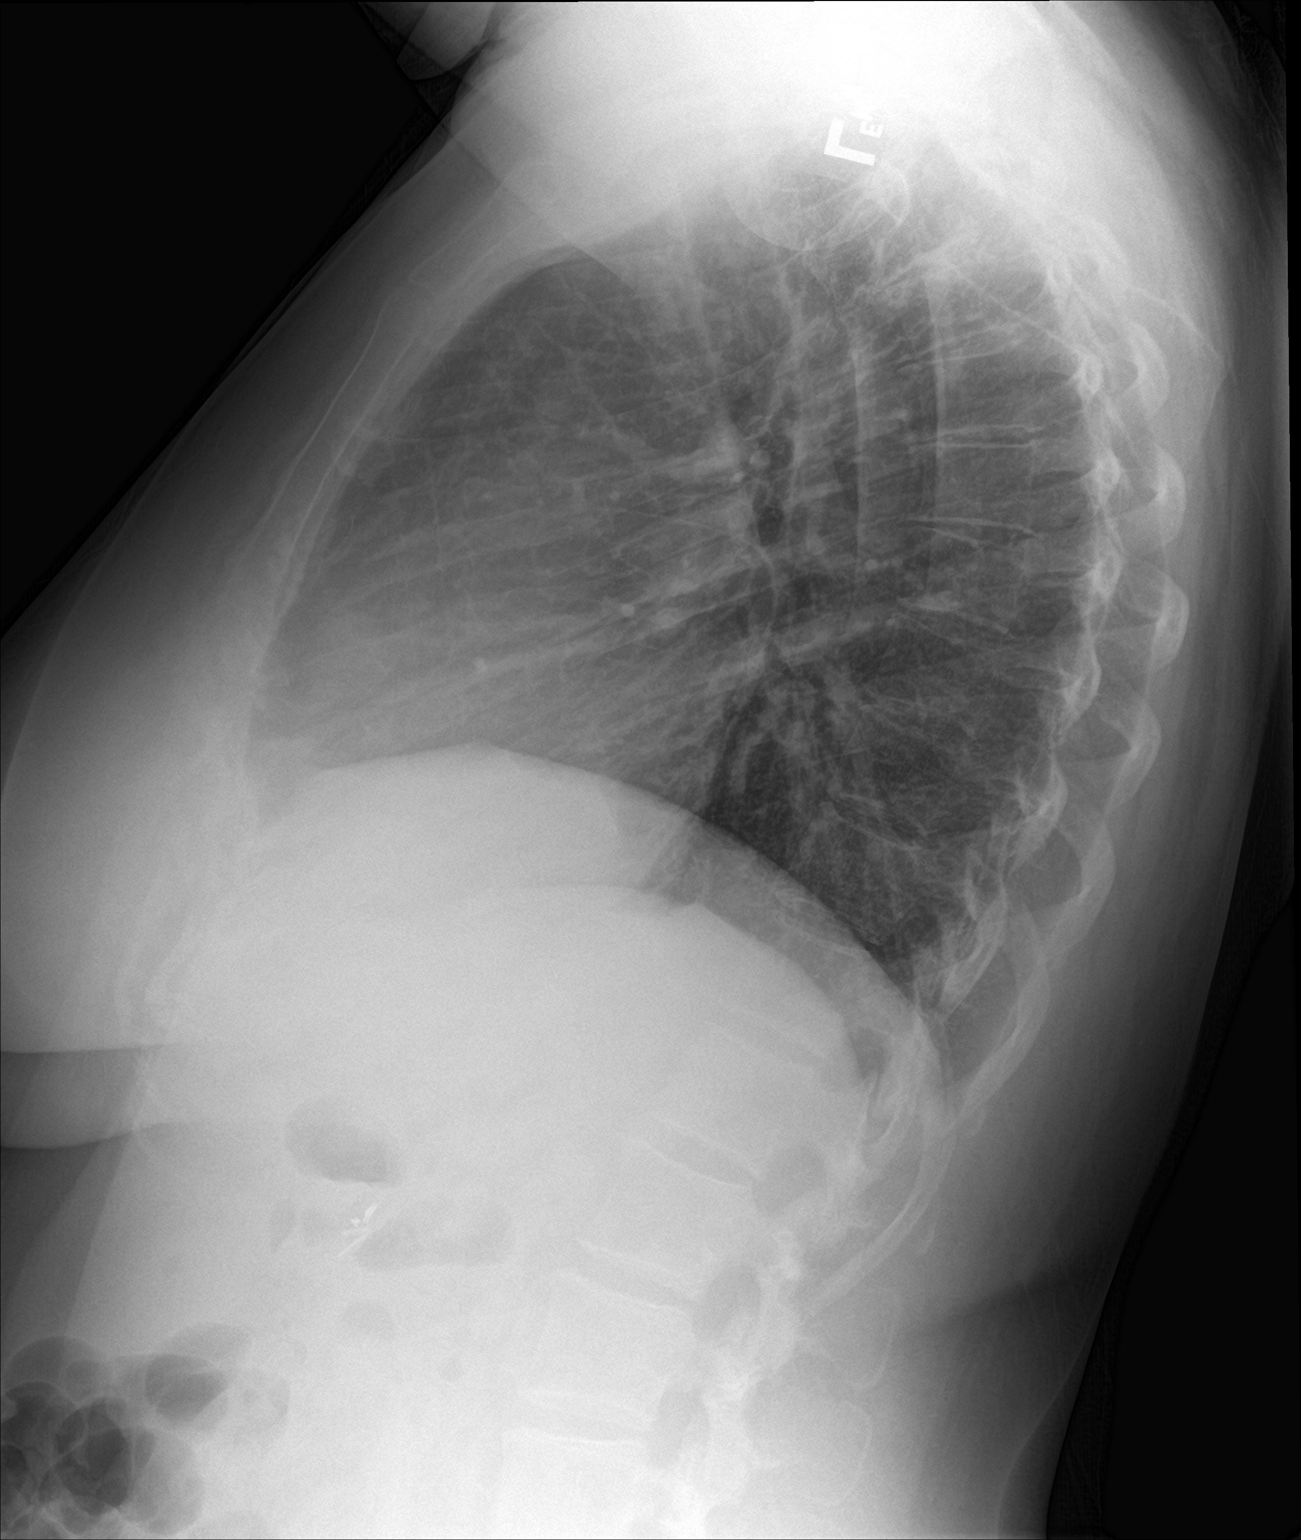

[2 of 2 positions shown; findings below may reference images not displayed]

FINDINGS: Stable cardiomediastinal silhouette with top-normal heart size. No
pneumothorax. No pleural effusion. Lungs appear clear, with no acute
consolidative airspace disease and no pulmonary edema.
Cholecystectomy clips are seen in the right upper quadrant of the
abdomen.
IMPRESSION: No active cardiopulmonary disease.

## 2018-12-01 ENCOUNTER — Encounter (HOSPITAL_COMMUNITY): Payer: Self-pay | Admitting: Emergency Medicine

## 2018-12-01 ENCOUNTER — Emergency Department (HOSPITAL_COMMUNITY): Payer: 59

## 2018-12-01 ENCOUNTER — Emergency Department (HOSPITAL_COMMUNITY)
Admission: EM | Admit: 2018-12-01 | Discharge: 2018-12-02 | Disposition: A | Discharge status: Home / Self Care | Payer: 59 | Attending: Emergency Medicine | Admitting: Emergency Medicine

## 2018-12-01 DIAGNOSIS — R0789 Other chest pain: Secondary | ICD-10-CM

## 2018-12-01 DIAGNOSIS — R0602 Shortness of breath: Secondary | ICD-10-CM | POA: Insufficient documentation

## 2018-12-01 DIAGNOSIS — Z79899 Other long term (current) drug therapy: Secondary | ICD-10-CM | POA: Insufficient documentation

## 2018-12-01 DIAGNOSIS — F17210 Nicotine dependence, cigarettes, uncomplicated: Secondary | ICD-10-CM | POA: Insufficient documentation

## 2018-12-01 DIAGNOSIS — R51 Headache: Secondary | ICD-10-CM | POA: Insufficient documentation

## 2018-12-01 LAB — CBC
HEMATOCRIT: 35.5 % — AB (ref 36.0–46.0)
Hemoglobin: 11.1 g/dL — ABNORMAL LOW (ref 12.0–15.0)
MCH: 27.8 pg (ref 26.0–34.0)
MCHC: 31.3 g/dL (ref 30.0–36.0)
MCV: 89 fL (ref 80.0–100.0)
Platelets: 252 10*3/uL (ref 150–400)
RBC: 3.99 MIL/uL (ref 3.87–5.11)
RDW: 13.2 % (ref 11.5–15.5)
WBC: 11.2 10*3/uL — AB (ref 4.0–10.5)
nRBC: 0 % (ref 0.0–0.2)

## 2018-12-01 LAB — BASIC METABOLIC PANEL
Anion gap: 9 (ref 5–15)
BUN: 13 mg/dL (ref 6–20)
CHLORIDE: 106 mmol/L (ref 98–111)
CO2: 23 mmol/L (ref 22–32)
CREATININE: 0.76 mg/dL (ref 0.44–1.00)
Calcium: 8.9 mg/dL (ref 8.9–10.3)
GFR calc Af Amer: >60 mL/min (ref >60)
GFR calc non Af Amer: >60 mL/min (ref >60)
Glucose, Bld: 138 mg/dL — ABNORMAL HIGH (ref 70–99)
Potassium: 3.8 mmol/L (ref 3.5–5.1)
SODIUM: 138 mmol/L (ref 135–145)

## 2018-12-01 LAB — I-STAT TROPONIN, ED: Troponin i, poc: 0.01 ng/mL (ref 0.00–0.08)

## 2018-12-01 LAB — I-STAT BETA HCG BLOOD, ED (MC, WL, AP ONLY): I-stat hCG, quantitative: <5 m[IU]/mL (ref <5)

## 2018-12-01 MED ORDER — TRAMADOL HCL 50 MG PO TABS
50.0000 mg | ORAL_TABLET | Freq: Once | ORAL | Status: AC
  Administered 2018-12-01: 50 mg via ORAL
  Filled 2018-12-01: qty 1

## 2018-12-01 MED ORDER — SODIUM CHLORIDE 0.9% FLUSH: 3.0000 mL | Freq: Once | INTRAVENOUS | Status: DC

## 2018-12-01 NOTE — ED Triage Notes (Signed)
  Patient BIB EMS for chest pain and SOB.  Patient started having chest pains around 1700 on her porch, patient had just had a meal.  Started having L side chest and L shoulder pain.  Patient called EMS and was given 1 nitro, and zofran.  Nausea with no vomiting.  Patient states pain is 8/10, nitro helped but gave headache.  Patient is A&O x4.  Patient recently had Atenolol dose adjusted.

## 2018-12-01 NOTE — ED Provider Notes (Signed)
[facility] Provider Note   CSN: [id number] Arrival date & time: 12/01/18  2139     History   Chief Complaint Chief Complaint  Patient presents with  . Chest Pain  . Shortness of Breath    HPI Diane [name] is a 38 y.o. female with past medical history significant for migraine who presents today complaining of substernal chest pain/pressure radiating to her left shoulder.  Patient reports that symptoms started earlier this evening however associated with diaphoresis, nausea and dizziness. Denies any prior history of chest pain but reports mom recently had a cardiac cath.  Patient was seen 2 days ago by PCP and started on atenolol for what appears to be tachycardia at the time. Patient also reports retaining more fluid in the past 24 hours. Patient denies any shortness of breath, abdominal pain, vomiting, numbness, tingling.  In route to the ED, patient received 1 nitroglycerin which resulted in significant headache.  Patient is unable to take any NSAIDs due to history of gastritis and GI bleed.  HPI  Past Medical History:  Diagnosis Date  . ADD (attention deficit disorder with hyperactivity)   . Anemia    takes iron supplement  . Anxiety   . Choking    due to food and pills get stuck  . Depression   . Dysmenorrhea   . Family history of adverse reaction to anesthesia    pt's father has hx. of being hard to wake up post-op  . GERD (gastroesophageal reflux disease)   . History of gastric ulcer   . History of kidney stones   . IBS (irritable bowel syndrome)   . Migraines    neurologist-  dr c. Sharene Skeanshagen ([facility] in [place])-- treated with Emgality injection every 30 days  . Mild obstructive sleep apnea    per study in epic 03-21-2017 mild osa, recommendation cpap, mouth appliance, wt loss  . Osteoarthritis    right ankle  . Osteochondral lesion 09/2016   right ankle  . Osteomyelitis of knee region [facility](HCC)   . Peripheral  vascular disease (HCC)    NERVE DAMAGE RIGHT LEG AND FOOT DUE TO INJURGY.   Marland Kitchen. Plantar fasciitis of left foot   . PONV (postoperative nausea and vomiting)    AND HEADACHES  . Pseudotumor cerebri    at back head  . S/P dilatation of esophageal stricture 07/2018  . Sinusitis 08/12/2018   RESOLVED WITH ANTIOBIOTIC  . SUI (stress urinary incontinence, female)   . Wears contact lenses   . Wears partial dentures    upper    Patient Active Problem List   Diagnosis Date Noted  . Dysmenorrhea 08/27/2018  . OAB (overactive bladder) 08/12/2017  . Dysphagia 08/12/2017  . Dry mouth 08/12/2017  . Osteochondral lesion 06/03/2017  . OSA (obstructive sleep apnea) 03/24/2017  . Nausea and vomiting 12/03/2016  . RUQ pain   . Cholelithiasis and acute cholecystitis without obstruction 12/02/2016  . Ankle fracture 01/05/2016  . Chronic pain of right knee 04/12/2015  . Gastric ulcer 03/16/2015  . Chronic cough 12/27/2013  . Pleural effusion 12/15/2013  . Chronic migraine without aura 04/01/2013  . DEPRESSION, ACUTE 10/02/2008  . Narcotic dependence (HCC) 10/02/2008  . BENIGN INTRACRANIAL HYPERTENSION 06/16/2008  . BACK PAIN 03/28/2008  . WEIGHT GAIN 01/25/2008  . Anxiety state 11/09/2007  . HEADACHE 07/09/2007    Past Surgical History:  Procedure Laterality Date  . ANKLE ARTHROSCOPY Right 10/22/2016   Procedure: ANKLE ARTHROSCOPY;  Surgeon: Vivi Barrack, DPM;  Location: [facility];  Service: Podiatry;  Laterality: Right;  GENERAL/REG BLOCK  . ANKLE ARTHROSCOPY Right 09-15-2017   @[facility]   . BLADDER SURGERY  child   x 2 - as a child to stretch bladder  . CESAREAN SECTION  12/24/2009  . CESAREAN SECTION  11/20/2011   Procedure: CESAREAN SECTION;  Surgeon: Lenoard Aden, MD;  Location: [facility];  Service: Gynecology;  Laterality: N/A;  . CHOLECYSTECTOMY N/A 12/03/2016   Procedure: LAPAROSCOPIC CHOLECYSTECTOMY WITH INTRAOPERATIVE CHOLANGIOGRAM;  Surgeon: Darnell Level, MD;   Location: [facility];  Service: General;  Laterality: N/A;  . CYSTOSCOPY WITH RETROGRADE PYELOGRAM, URETEROSCOPY AND STENT PLACEMENT Left 08/10/2006  . ESOPHAGOGASTRODUODENOSCOPY (EGD) WITH ESOPHAGEAL DILATION  07/2018  . HARDWARE REMOVAL Right 07/01/2016   Procedure: HARDWARE REMOVAL;  Surgeon: Cammy Copa, MD;  Location: [facility] OR;  Service: Orthopedics;  Laterality: Right;  . KNEE ARTHROSCOPY WITH LATERAL RELEASE Right 12/13/2014   Procedure: KNEE ARTHROSCOPY WITH LATERAL RELEASE;  Surgeon: Thera Flake., MD;  Location: [facility];  Service: Orthopedics;  Laterality: Right;  . LAPAROSCOPIC APPENDECTOMY N/A 06/11/2014   Procedure: APPENDECTOMY LAPAROSCOPIC;  Surgeon: Velora Heckler, MD;  Location: [facility];  Service: General;  Laterality: N/A;  . ORIF ANKLE FRACTURE Right 01/05/2016   Procedure: OPEN REDUCTION INTERNAL FIXATION (ORIF) ANKLE FRACTURE;  Surgeon: Cammy Copa, MD;  Location: [facility];  Service: Orthopedics;  Laterality: Right;  . OVARIAN CYST SURGERY Right 03/05/2007   AND CHROMOPERTUBATION  VIA LAPAROSCOPY  . ROBOTIC ASSISTED LAPAROSCOPIC HYSTERECTOMY AND SALPINGECTOMY Bilateral 08/27/2018   Procedure: XI ROBOTIC ASSISTED LAPAROSCOPIC HYSTERECTOMY AND BILATERAL SALPINGECTOMY;  Surgeon: Olivia Mackie, MD;  Location: [facility];  Service: Gynecology;  Laterality: Bilateral;  [facility] for 23hr OBS  . SYNOVECTOMY Right 12/13/2014   Procedure: PLICA SYNOVECTOMY;  Surgeon: Thera Flake., MD;  Location: [facility];  Service: Orthopedics;  Laterality: Right;  . TONSILLECTOMY AND ADENOIDECTOMY  child  . WISDOM TOOTH EXTRACTION       OB History    Gravida  2   Para  2   Term  1   Preterm  1   AB  0   Living  2     SAB  0   TAB  0   Ectopic  0   Multiple  0   Live Births  1            Home Medications    Prior to Admission medications   Medication Sig Start Date End Date Taking? Authorizing Provider  ADDERALL XR 20 MG 24 hr capsule  Take 20 mg by mouth every morning. 07/09/18  Yes [provider]  albuterol (PROVENTIL HFA;VENTOLIN HFA) 108 (90 Base) MCG/ACT inhaler Inhale 2 puffs into the lungs every 6 (six) hours as needed for wheezing or shortness of breath.    Yes [provider]  atenolol (TENORMIN) 25 MG tablet Take 25 mg by mouth daily.   Yes [provider]  cyclobenzaprine (FLEXERIL) 5 MG tablet Take 5 mg by mouth 3 (three) times daily as needed for muscle spasms.   Yes [provider]  dicyclomine (BENTYL) 20 MG tablet TAKE ONE TABLET BY MOUTH 3 TIMES DAILY BEFORE MEALS Patient taking differently: Take 20 mg by mouth 4 (four) times daily -  before meals and at bedtime.  06/03/18  Yes Meryl Dare, MD  EMGALITY 120 MG/ML SOAJ Inject 120 mg into the  skin every 30 (thirty) days. 08/02/18  Yes [provider]  gabapentin (NEURONTIN) 300 MG capsule TAKE 2 CAPSULES BY MOUTH 3 TIMES DAILY Patient taking differently: Take 600-1,200 mg by mouth See admin instructions. Take 600mg  by mouth in the morning and 1,200mg  in the evening 05/26/17  Yes Nelwyn Salisbury, MD  lidocaine (XYLOCAINE) 2 % solution Use as directed 15 mLs in the mouth or throat as needed for mouth pain.   Yes [provider]  magic mouthwash SOLN Take 5 mLs by mouth 4 (four) times daily as needed for mouth pain.   Yes [provider]  Magnesium 400 MG TABS Take 1,200 mg by mouth at bedtime.    Yes [provider]  mirabegron ER (MYRBETRIQ) 25 MG TB24 tablet Take 25 mg by mouth at bedtime.   Yes [provider]  montelukast (SINGULAIR) 10 MG tablet Take 10 mg by mouth at bedtime.    Yes [provider]  pantoprazole (PROTONIX) 40 MG tablet TAKE ONE TABLET BY MOUTH TWICE DAILY Patient taking differently: Take 40 mg by mouth 2 (two) times daily.  11/26/17  Yes Sheliah Hatch, MD  Polyethylene Glycol 3350 (MIRALAX PO) Take by mouth at bedtime.   Yes [provider]    promethazine (PHENERGAN) 25 MG tablet Take 1 tablet (25 mg total) by mouth every 8 (eight) hours as needed for nausea or vomiting. 08/14/17  Yes Waldon Merl, PA-C  pseudoephedrine-codeine-guaifenesin (MYTUSSIN DAC) 30-10-100 MG/5ML solution Take 10 mLs by mouth 4 (four) times daily as needed for cough.   Yes [provider]  tiZANidine (ZANAFLEX) 4 MG tablet Take 4 mg by mouth every 6 (six) hours as needed for muscle spasms.  11/18/16  Yes [provider]  venlafaxine XR (EFFEXOR-XR) 150 MG 24 hr capsule Take 2 capsules (300 mg total) by mouth daily before breakfast. 11/06/16  Yes Nelwyn Salisbury, MD  diazepam (VALIUM) 5 MG tablet Take 1 tablet (5 mg total) by mouth every 12 (twelve) hours as needed for anxiety. Patient not taking: Reported on 12/01/2018 11/05/16   Nelwyn Salisbury, MD  oxyCODONE-acetaminophen (PERCOCET/ROXICET) 5-325 MG tablet Take 1-2 tablets by mouth every 6 (six) hours as needed for severe pain. Patient not taking: Reported on 12/01/2018 08/28/18   Olivia Mackie, MD  traMADol (ULTRAM) 50 MG tablet Take 1 tablet (50 mg total) by mouth every 6 (six) hours as needed for moderate pain. Patient not taking: Reported on 12/01/2018 08/28/18   Olivia Mackie, MD    Family History Family History  Problem Relation Age of Onset  . Brain cancer Father   . Anesthesia problems Father        hard to wake up post-op  . Lung cancer Father   . Breast cancer Maternal Grandmother   . Ovarian cancer Maternal Grandmother   . Diabetes Maternal Grandmother   . Heart disease Maternal Grandmother   . Irritable bowel syndrome Mother   . Prostate cancer Maternal Grandfather   . Melanoma Maternal Grandfather   . Colon cancer Neg Hx     Social History Social History   Tobacco Use  . Smoking status: Current Some Day Smoker    Packs/day: 0.50    Years: 15.00    Pack years: 7.50    Types: Cigarettes  . Smokeless tobacco: Never Used  Substance Use Topics  . Alcohol use:  Yes    Alcohol/week: 0.0 standard drinks    Comment: occasionally - glass wine  .  Drug use: No     Allergies   Nickel; Nsaids; Oxybutynin; Adhesive [tape]; Metoclopramide hcl; Prochlorperazine edisylate; and Sulfa antibiotics   Review of Systems Review of Systems  Constitutional: Positive for diaphoresis.  Eyes: Negative.   Respiratory: Positive for chest tightness and shortness of breath.   Cardiovascular: Positive for chest pain.  Gastrointestinal: Positive for nausea.  Endocrine: Negative.   Genitourinary: Negative.   Musculoskeletal: Negative.   Skin: Negative.   Neurological: Positive for dizziness.    Physical Exam Updated Vital Signs BP 126/79 (BP Location: Left Arm)   Pulse 62   Temp 98.5 F (36.9 C) (Oral)   Resp 16   Ht 5\' 7"  (1.702 m)   Wt 96.2 kg   LMP 08/26/2018   SpO2 97%   BMI 33.20 kg/m   Physical Exam Constitutional:      Appearance: She is well-developed and normal weight.  HENT:     Head: Normocephalic and atraumatic.  Neck:     Musculoskeletal: Normal range of motion and neck supple.  Cardiovascular:     Rate and Rhythm: Normal rate and regular rhythm.     Heart sounds: Normal heart sounds.  Pulmonary:     Effort: Pulmonary effort is normal.     Breath sounds: Normal breath sounds.  Abdominal:     General: Bowel sounds are normal.     Palpations: Abdomen is soft.  Musculoskeletal: Normal range of motion.  Skin:    General: Skin is warm and dry.     Capillary Refill: Capillary refill takes less than 2 seconds.  Neurological:     General: No focal deficit present.     Mental Status: She is alert and oriented to person, place, and time.  Psychiatric:        Mood and Affect: Mood normal.      ED Treatments / Results  Labs (all labs ordered are listed, but only abnormal results are displayed) Labs Reviewed  BASIC METABOLIC PANEL - Abnormal; Notable for the following components:      Result Value   Glucose, Bld 138 (*)    All  other components within normal limits  CBC - Abnormal; Notable for the following components:   WBC 11.2 (*)    Hemoglobin 11.1 (*)    HCT 35.5 (*)    All other components within normal limits  I-STAT TROPONIN, ED  I-STAT BETA HCG BLOOD, ED ([facility], [facility] ONLY)  I-STAT TROPONIN, ED    EKG EKG Interpretation  Date/Time:  Wednesday December 01 2018 21:44:20 EST Ventricular Rate:  64 PR Interval:    QRS Duration: 91 QT Interval:  438 QTC Calculation: 452 R Axis:   49 Text Interpretation:  Sinus rhythm Non-specific ST-t changes No significant change since last tracing Confirmed by Cathren Laine ([id number]) on 12/01/2018 9:47:34 PM Also confirmed by Cathren Laine ([id number]), editor Barbette Hair [phone])  on 12/02/2018 8:09:23 AM   Radiology Dg Chest 2 View  Result Date: 12/01/2018 CLINICAL DATA:  Left shoulder and left chest pain. Nausea and dizziness. EXAM: CHEST - 2 VIEW COMPARISON:  11/26/2017 FINDINGS: Normal heart size and pulmonary vascularity. No focal airspace disease or consolidation in the lungs. No blunting of costophrenic angles. No pneumothorax. Mediastinal contours appear intact. Surgical clips in the right upper quadrant. IMPRESSION: No active cardiopulmonary disease. Electronically Signed   By: Burman Nieves M.D.   On: 12/01/2018 22:14    Procedures Procedures (including critical care time)  Medications Ordered in ED Medications  traMADol (  ULTRAM) tablet 50 mg (50 mg Oral Given 12/01/18 2320)     Initial Impression / Assessment and Plan / ED Course  I have reviewed the triage vital signs and the nursing notes.  Pertinent labs & imaging results that were available during my care of the patient were reviewed by me and considered in my medical decision making (see chart for details).   Patient is a 38 year old female who presents today with substernal chest pain radiating to her left shoulder.  Initial troponin was negative.  EKG also showed normal sinus rhythm with no ST  changes.  Chest x-ray was negative for any cardiopulmonary process.  Heart score 1.  Patient received 1 nitroglycerin followed by decreasing blood pressure and headache.  She was given tramadol once for headache.  I-STAT troponin was repeated and was negative.  Given negative initial work-up for ACS, patient's age and prior history there are no obvious signs of acute coronary syndrome, will recommend outpatient follow-up with cardiology for further assessment.  Patient is in agreement with plan and has verbalized understanding.  Final Clinical Impressions(s) / ED Diagnoses   Final diagnoses:  Atypical chest pain    ED Discharge Orders    None       Lovena Neighboursiallo, Jahmeir Geisen, MD 12/02/18 [phone]    Cathren LaineSteinl, Kevin, MD 12/02/18 1312

## 2018-12-02 LAB — I-STAT TROPONIN, ED: TROPONIN I, POC: 0 ng/mL (ref 0.00–0.08)

## 2019-01-13 NOTE — Progress Notes (Deleted)
Diane Smileseferring-Wahiba Kartaoui FNP Reason for referral-chest pain  HPI: 38 year old female for evaluation of chest pain at request of Virl AxeWahiba Kartaoui FNP.  Patient seen in the emergency room January 2020 with chest pain.  Troponins were normal.  Hemoglobin 11.1.  Chest x-ray with no acute disease.  Current Outpatient Medications  Medication Sig Dispense Refill  . ADDERALL XR 20 MG 24 hr capsule Take 20 mg by mouth every morning.  0  . albuterol (PROVENTIL HFA;VENTOLIN HFA) 108 (90 Base) MCG/ACT inhaler Inhale 2 puffs into the lungs every 6 (six) hours as needed for wheezing or shortness of breath.     Marland Kitchen. atenolol (TENORMIN) 25 MG tablet Take 25 mg by mouth daily.    . cyclobenzaprine (FLEXERIL) 5 MG tablet Take 5 mg by mouth 3 (three) times daily as needed for muscle spasms.    . diazepam (VALIUM) 5 MG tablet Take 1 tablet (5 mg total) by mouth every 12 (twelve) hours as needed for anxiety. (Patient not taking: Reported on 12/01/2018) 60 tablet 5  . dicyclomine (BENTYL) 20 MG tablet TAKE ONE TABLET BY MOUTH 3 TIMES DAILY BEFORE MEALS (Patient taking differently: Take 20 mg by mouth 4 (four) times daily -  before meals and at bedtime. ) 90 tablet 0  . EMGALITY 120 MG/ML SOAJ Inject 120 mg into the skin every 30 (thirty) days.  4  . gabapentin (NEURONTIN) 300 MG capsule TAKE 2 CAPSULES BY MOUTH 3 TIMES DAILY (Patient taking differently: Take 600-1,200 mg by mouth See admin instructions. Take 600mg  by mouth in the morning and 1,200mg  in the evening) 180 capsule 4  . lidocaine (XYLOCAINE) 2 % solution Use as directed 15 mLs in the mouth or throat as needed for mouth pain.    . magic mouthwash SOLN Take 5 mLs by mouth 4 (four) times daily as needed for mouth pain.    . Magnesium 400 MG TABS Take 1,200 mg by mouth at bedtime.     . mirabegron ER (MYRBETRIQ) 25 MG TB24 tablet Take 25 mg by mouth at bedtime.    . montelukast (SINGULAIR) 10 MG tablet Take 10 mg by mouth at bedtime.     Marland Kitchen.  oxyCODONE-acetaminophen (PERCOCET/ROXICET) 5-325 MG tablet Take 1-2 tablets by mouth every 6 (six) hours as needed for severe pain. (Patient not taking: Reported on 12/01/2018) 40 tablet 0  . pantoprazole (PROTONIX) 40 MG tablet TAKE ONE TABLET BY MOUTH TWICE DAILY (Patient taking differently: Take 40 mg by mouth 2 (two) times daily. ) 60 tablet 3  . Polyethylene Glycol 3350 (MIRALAX PO) Take by mouth at bedtime.    . promethazine (PHENERGAN) 25 MG tablet Take 1 tablet (25 mg total) by mouth every 8 (eight) hours as needed for nausea or vomiting. 10 tablet 0  . pseudoephedrine-codeine-guaifenesin (MYTUSSIN DAC) 30-10-100 MG/5ML solution Take 10 mLs by mouth 4 (four) times daily as needed for cough.    Marland Kitchen. tiZANidine (ZANAFLEX) 4 MG tablet Take 4 mg by mouth every 6 (six) hours as needed for muscle spasms.     . traMADol (ULTRAM) 50 MG tablet Take 1 tablet (50 mg total) by mouth every 6 (six) hours as needed for moderate pain. (Patient not taking: Reported on 12/01/2018) 30 tablet 0  . venlafaxine XR (EFFEXOR-XR) 150 MG 24 hr capsule Take 2 capsules (300 mg total) by mouth daily before breakfast. 60 capsule 11   No current facility-administered medications for this visit.     Allergies  Allergen Reactions  .  Nickel     "any jewelry that's not real"/ rash and itching   . Nsaids Other (See Comments)    DUE TO HISTORY OF GASTRIC ULCER  . Oxybutynin Other (See Comments)    "severe dry mouth"  . Adhesive [Tape] Rash  . Metoclopramide Hcl Other (See Comments)    RESTLESSNESS/JITTERY  . Prochlorperazine Edisylate Other (See Comments)    RESTLESSNESS/JITTERY   . Sulfa Antibiotics Rash    Past Medical History:  Diagnosis Date  . ADD (attention deficit disorder with hyperactivity)   . Anemia    takes iron supplement  . Anxiety   . Choking    due to food and pills get stuck  . Depression   . Dysmenorrhea   . Family history of adverse reaction to anesthesia    pt's father has hx. of being  hard to wake up post-op  . GERD (gastroesophageal reflux disease)   . History of gastric ulcer   . History of kidney stones   . IBS (irritable bowel syndrome)   . Migraines    neurologist-  dr c. Sharene Skeans ([facility] in [place])-- treated with Emgality injection every 30 days  . Mild obstructive sleep apnea    per study in epic 03-21-2017 mild osa, recommendation cpap, mouth appliance, wt loss  . Osteoarthritis    right ankle  . Osteochondral lesion 09/2016   right ankle  . Osteomyelitis of knee region [facility])   . Peripheral vascular disease (HCC)    NERVE DAMAGE RIGHT LEG AND FOOT DUE TO INJURGY.   Marland Kitchen Plantar fasciitis of left foot   . PONV (postoperative nausea and vomiting)    AND HEADACHES  . Pseudotumor cerebri    at back head  . S/P dilatation of esophageal stricture 07/2018  . Sinusitis 08/12/2018   RESOLVED WITH ANTIOBIOTIC  . SUI (stress urinary incontinence, female)   . Wears contact lenses   . Wears partial dentures    upper    Past Surgical History:  Procedure Laterality Date  . ANKLE ARTHROSCOPY Right 10/22/2016   Procedure: ANKLE ARTHROSCOPY;  Surgeon: Vivi Barrack, DPM;  Location: [facility];  Service: Podiatry;  Laterality: Right;  GENERAL/REG BLOCK  . ANKLE ARTHROSCOPY Right 09-15-2017   @[facility]   . BLADDER SURGERY  child   x 2 - as a child to stretch bladder  . CESAREAN SECTION  12/24/2009  . CESAREAN SECTION  11/20/2011   Procedure: CESAREAN SECTION;  Surgeon: Lenoard Aden, MD;  Location: [facility];  Service: Gynecology;  Laterality: N/A;  . CHOLECYSTECTOMY N/A 12/03/2016   Procedure: LAPAROSCOPIC CHOLECYSTECTOMY WITH INTRAOPERATIVE CHOLANGIOGRAM;  Surgeon: Darnell Level, MD;  Location: [facility];  Service: General;  Laterality: N/A;  . CYSTOSCOPY WITH RETROGRADE PYELOGRAM, URETEROSCOPY AND STENT PLACEMENT Left 08/10/2006  . ESOPHAGOGASTRODUODENOSCOPY (EGD) WITH ESOPHAGEAL DILATION  07/2018  . HARDWARE REMOVAL Right 07/01/2016    Procedure: HARDWARE REMOVAL;  Surgeon: Cammy Copa, MD;  Location: [facility];  Service: Orthopedics;  Laterality: Right;  . KNEE ARTHROSCOPY WITH LATERAL RELEASE Right 12/13/2014   Procedure: KNEE ARTHROSCOPY WITH LATERAL RELEASE;  Surgeon: Thera Flake., MD;  Location: [facility];  Service: Orthopedics;  Laterality: Right;  . LAPAROSCOPIC APPENDECTOMY N/A 06/11/2014   Procedure: APPENDECTOMY LAPAROSCOPIC;  Surgeon: Velora Heckler, MD;  Location: [facility];  Service: General;  Laterality: N/A;  . ORIF ANKLE FRACTURE Right 01/05/2016   Procedure: OPEN REDUCTION INTERNAL FIXATION (ORIF) ANKLE FRACTURE;  Surgeon: Cammy Copa,  MD;  Location: [facility];  Service: Orthopedics;  Laterality: Right;  . OVARIAN CYST SURGERY Right 03/05/2007   AND CHROMOPERTUBATION  VIA LAPAROSCOPY  . ROBOTIC ASSISTED LAPAROSCOPIC HYSTERECTOMY AND SALPINGECTOMY Bilateral 08/27/2018   Procedure: XI ROBOTIC ASSISTED LAPAROSCOPIC HYSTERECTOMY AND BILATERAL SALPINGECTOMY;  Surgeon: Olivia Mackie, MD;  Location: [facility];  Service: Gynecology;  Laterality: Bilateral;  [facility] for 23hr OBS  . SYNOVECTOMY Right 12/13/2014   Procedure: PLICA SYNOVECTOMY;  Surgeon: Thera Flake., MD;  Location: [facility];  Service: Orthopedics;  Laterality: Right;  . TONSILLECTOMY AND ADENOIDECTOMY  child  . WISDOM TOOTH EXTRACTION      Social History   Socioeconomic History  . Marital status: Married    Spouse name: Not on file  . Number of children: 2  . Years of education: Not on file  . Highest education level: Not on file  Occupational History  . Occupation: Investment banker, 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  Social Needs  . Financial resource strain: Not on file  . Food insecurity:    Worry: Not on file    Inability: Not on file  . Transportation needs:    Medical: Not on file    Non-medical: Not on file  Tobacco Use  . Smoking status: Current Some Day Smoker    Packs/day: 0.50    Years:  15.00    Pack years: 7.50    Types: Cigarettes  . Smokeless tobacco: Never Used  Substance and Sexual Activity  . Alcohol use: Yes    Alcohol/week: 0.0 standard drinks    Comment: occasionally - glass wine  . Drug use: No  . Sexual activity: Not on file  Lifestyle  . Physical activity:    Days per week: Not on file    Minutes per session: Not on file  . Stress: Not on file  Relationships  . Social connections:    Talks on phone: Not on file    Gets together: Not on file    Attends religious service: Not on file    Active member of club or organization: Not on file    Attends meetings of clubs or organizations: Not on file    Relationship status: Not on file  . Intimate partner violence:    Fear of current or ex partner: Not on file    Emotionally abused: Not on file    Physically abused: Not on file    Forced sexual activity: Not on file  Other Topics Concern  . Not on file  Social History Narrative  . Not on file    Family History  Problem Relation Age of Onset  . Brain cancer Father   . Anesthesia problems Father        hard to wake up post-op  . Lung cancer Father   . Breast cancer Maternal Grandmother   . Ovarian cancer Maternal Grandmother   . Diabetes Maternal Grandmother   . Heart disease Maternal Grandmother   . Irritable bowel syndrome Mother   . Prostate cancer Maternal Grandfather   . Melanoma Maternal Grandfather   . Colon cancer Neg Hx     ROS: no fevers or chills, productive cough, hemoptysis, dysphasia, odynophagia, melena, hematochezia, dysuria, hematuria, rash, seizure activity, orthopnea, PND, pedal edema, claudication. Remaining systems are negative.  Physical Exam:   Last menstrual period 08/26/2018.  General:  Well developed/well nourished in NAD Skin warm/dry Patient not depressed No peripheral clubbing Back-normal HEENT-normal/normal eyelids  Neck supple/normal carotid upstroke bilaterally; no bruits; no JVD; no thyromegaly chest -  CTA/ normal expansion CV - RRR/normal S1 and S2; no murmurs, rubs or gallops;  PMI nondisplaced Abdomen -NT/ND, no HSM, no mass, + bowel sounds, no bruit 2+ femoral pulses, no bruits Ext-no edema, chords, 2+ DP Neuro-grossly nonfocal  ECG -December 01, 2018-sinus rhythm with no ST changes.  Personally reviewed  A/P  1  Diane Millers, MD

## 2019-01-18 ENCOUNTER — Ambulatory Visit: Payer: 59 | Admitting: Cardiology

## 2019-01-19 ENCOUNTER — Encounter: Payer: Self-pay | Admitting: *Deleted

## 2019-03-18 ENCOUNTER — Emergency Department (HOSPITAL_BASED_OUTPATIENT_CLINIC_OR_DEPARTMENT_OTHER): Payer: 59

## 2019-03-18 ENCOUNTER — Emergency Department (HOSPITAL_BASED_OUTPATIENT_CLINIC_OR_DEPARTMENT_OTHER)
Admission: EM | Admit: 2019-03-18 | Discharge: 2019-03-18 | Disposition: A | Discharge status: Home / Self Care | Payer: 59 | Attending: Emergency Medicine | Admitting: Emergency Medicine

## 2019-03-18 ENCOUNTER — Other Ambulatory Visit: Payer: Self-pay

## 2019-03-18 ENCOUNTER — Encounter (HOSPITAL_BASED_OUTPATIENT_CLINIC_OR_DEPARTMENT_OTHER): Payer: Self-pay | Admitting: Emergency Medicine

## 2019-03-18 DIAGNOSIS — F17210 Nicotine dependence, cigarettes, uncomplicated: Secondary | ICD-10-CM | POA: Diagnosis not present

## 2019-03-18 DIAGNOSIS — Z79899 Other long term (current) drug therapy: Secondary | ICD-10-CM | POA: Insufficient documentation

## 2019-03-18 DIAGNOSIS — R0602 Shortness of breath: Secondary | ICD-10-CM | POA: Diagnosis present

## 2019-03-18 DIAGNOSIS — J111 Influenza due to unidentified influenza virus with other respiratory manifestations: Secondary | ICD-10-CM | POA: Diagnosis not present

## 2019-03-18 DIAGNOSIS — R69 Illness, unspecified: Secondary | ICD-10-CM

## 2019-03-18 LAB — CBC WITH DIFFERENTIAL/PLATELET
Abs Immature Granulocytes: 0.03 10*3/uL (ref 0.00–0.07)
Basophils Absolute: 0 10*3/uL (ref 0.0–0.1)
Basophils Relative: 0 %
Eosinophils Absolute: 0 10*3/uL (ref 0.0–0.5)
Eosinophils Relative: 0 %
HCT: 34.8 % — ABNORMAL LOW (ref 36.0–46.0)
Hemoglobin: 11.2 g/dL — ABNORMAL LOW (ref 12.0–15.0)
Immature Granulocytes: 0 %
Lymphocytes Relative: 12 %
Lymphs Abs: 1.2 10*3/uL (ref 0.7–4.0)
MCH: 29.3 pg (ref 26.0–34.0)
MCHC: 32.2 g/dL (ref 30.0–36.0)
MCV: 91.1 fL (ref 80.0–100.0)
Monocytes Absolute: 0.3 10*3/uL (ref 0.1–1.0)
Monocytes Relative: 3 %
Neutro Abs: 7.8 10*3/uL — ABNORMAL HIGH (ref 1.7–7.7)
Neutrophils Relative %: 85 %
Platelets: 193 10*3/uL (ref 150–400)
RBC: 3.82 MIL/uL — ABNORMAL LOW (ref 3.87–5.11)
RDW: 12.3 % (ref 11.5–15.5)
WBC: 9.3 10*3/uL (ref 4.0–10.5)
nRBC: 0 % (ref 0.0–0.2)

## 2019-03-18 LAB — BASIC METABOLIC PANEL
Anion gap: 7 (ref 5–15)
BUN: 12 mg/dL (ref 6–20)
CO2: 24 mmol/L (ref 22–32)
Calcium: 8.8 mg/dL — ABNORMAL LOW (ref 8.9–10.3)
Chloride: 104 mmol/L (ref 98–111)
Creatinine, Ser: 0.7 mg/dL (ref 0.44–1.00)
GFR calc Af Amer: >60 mL/min (ref >60)
GFR calc non Af Amer: >60 mL/min (ref >60)
Glucose, Bld: 138 mg/dL — ABNORMAL HIGH (ref 70–99)
Potassium: 3.9 mmol/L (ref 3.5–5.1)
Sodium: 135 mmol/L (ref 135–145)

## 2019-03-18 LAB — TROPONIN I: Troponin I: <0.03 ng/mL (ref <0.03)

## 2019-03-18 MED ORDER — ONDANSETRON HCL 4 MG/2ML IJ SOLN
4.0000 mg | Freq: Once | INTRAMUSCULAR | Status: AC
  Administered 2019-03-18: 20:00:00 4 mg via INTRAVENOUS
  Filled 2019-03-18: qty 2

## 2019-03-18 MED ORDER — SODIUM CHLORIDE 0.9 % IV BOLUS
1000.0000 mL | Freq: Once | INTRAVENOUS | Status: AC
  Administered 2019-03-18: 1000 mL via INTRAVENOUS

## 2019-03-18 NOTE — ED Notes (Signed)
Patient transported to X-ray 

## 2019-03-18 NOTE — ED Notes (Signed)
Pt very talkative without notable sob, pt states feels dehydrated, nauseated and sob with some exertion.

## 2019-03-18 NOTE — ED Notes (Signed)
RN w/ patient

## 2019-03-18 NOTE — ED Notes (Signed)
Pt smiling, talkative. Texting on phone. No acute distress or sob noted.

## 2019-03-18 NOTE — ED Triage Notes (Signed)
EMS transport c/o feeling dizzy , lightheaded. Short of breath and nausea. For 7 days. Pt dx with pnuemonia and had a negative COVID test Last Thursday.

## 2019-03-18 NOTE — ED Notes (Signed)
ED Provider at bedside. 

## 2019-03-18 NOTE — ED Provider Notes (Signed)
[facility] EMERGENCY DEPARTMENT Provider Note   CSN: [id number] Arrival date & time: 03/18/19  1925    History   Chief Complaint Chief Complaint  Patient presents with  . Near Syncope  . Shortness of Breath    HPI Diane [name] is a 38 y.o. female.     Patient is a 38 year old female with a history of GERD, irritable bowel syndrome, fibromyalgia, chronic back pain and migraines who presents with flulike symptoms.  She states for about the 2 weeks she has had flulike symptoms with achiness nausea cough and shortness of breath.  She has not had any known fevers but states she does not have a thermometer at home.  She has not had any vomiting.  She is had a little bit of diarrhea.  She was seen in the emergency department on April 16 and had a negative COVID test.  Her chest x-ray at that time showed possible bibasilar atelectasis versus infiltrates.  She states she continued to feel bad and had a telehealth visit with her PCP on April 29.  She was started on Levaquin for pneumonia.  She also was started on steroids.  She states that she continues to have shortness of breath and states that she is not eating or drinking much and feels very dehydrated.  She feels lightheaded when she stands up.  She did have some pain to the center of her chest and across her chest that started last couple days.  It is worse with movement and certain positions.     Past Medical History:  Diagnosis Date  . ADD (attention deficit disorder with hyperactivity)   . Anemia    takes iron supplement  . Anxiety   . Choking    due to food and pills get stuck  . Depression   . Dysmenorrhea   . Family history of adverse reaction to anesthesia    pt's father has hx. of being hard to wake up post-op  . GERD (gastroesophageal reflux disease)   . History of gastric ulcer   . History of kidney stones   . IBS (irritable bowel syndrome)   . Migraines    neurologist-  dr c. Sharene Skeans ([facility] in [place])-- treated with Emgality injection every 30 days  . Mild obstructive sleep apnea    per study in epic 03-21-2017 mild osa, recommendation cpap, mouth appliance, wt loss  . Osteoarthritis    right ankle  . Osteochondral lesion 09/2016   right ankle  . Osteomyelitis of knee region [facility])   . Peripheral vascular disease (HCC)    NERVE DAMAGE RIGHT LEG AND FOOT DUE TO INJURGY.   Marland Kitchen Plantar fasciitis of left foot   . PONV (postoperative nausea and vomiting)    AND HEADACHES  . Pseudotumor cerebri    at back head  . S/P dilatation of esophageal stricture 07/2018  . Sinusitis 08/12/2018   RESOLVED WITH ANTIOBIOTIC  . SUI (stress urinary incontinence, female)   . Wears contact lenses   . Wears partial dentures    upper    Patient Active Problem List   Diagnosis Date Noted  . Dysmenorrhea 08/27/2018  . OAB (overactive bladder) 08/12/2017  . Dysphagia 08/12/2017  . Dry mouth 08/12/2017  . Osteochondral lesion 06/03/2017  . OSA (obstructive sleep apnea) 03/24/2017  . Nausea and vomiting 12/03/2016  . RUQ pain   . Cholelithiasis and acute cholecystitis without obstruction 12/02/2016  . Ankle fracture 01/05/2016  . Chronic pain of  right knee 04/12/2015  . Gastric ulcer 03/16/2015  . Chronic cough 12/27/2013  . Pleural effusion 12/15/2013  . Chronic migraine without aura 04/01/2013  . DEPRESSION, ACUTE 10/02/2008  . Narcotic dependence (HCC) 10/02/2008  . BENIGN INTRACRANIAL HYPERTENSION 06/16/2008  . BACK PAIN 03/28/2008  . WEIGHT GAIN 01/25/2008  . Anxiety state 11/09/2007  . HEADACHE 07/09/2007    Past Surgical History:  Procedure Laterality Date  . ANKLE ARTHROSCOPY Right 10/22/2016   Procedure: ANKLE ARTHROSCOPY;  Surgeon: Vivi Barrack, DPM;  Location: [facility];  Service: Podiatry;  Laterality: Right;  GENERAL/REG BLOCK  . ANKLE ARTHROSCOPY Right 09-15-2017     . BLADDER SURGERY  child   x 2 - as a child to stretch bladder   . CESAREAN SECTION  12/24/2009  . CESAREAN SECTION  11/20/2011   Procedure: CESAREAN SECTION;  Surgeon: Lenoard Aden, MD;  Location: [facility];  Service: Gynecology;  Laterality: N/A;  . CHOLECYSTECTOMY N/A 12/03/2016   Procedure: LAPAROSCOPIC CHOLECYSTECTOMY WITH INTRAOPERATIVE CHOLANGIOGRAM;  Surgeon: Darnell Level, MD;  Location: [facility];  Service: General;  Laterality: N/A;  . CYSTOSCOPY WITH RETROGRADE PYELOGRAM, URETEROSCOPY AND STENT PLACEMENT Left 08/10/2006  . ESOPHAGOGASTRODUODENOSCOPY (EGD) WITH ESOPHAGEAL DILATION  07/2018  . HARDWARE REMOVAL Right 07/01/2016   Procedure: HARDWARE REMOVAL;  Surgeon: Cammy Copa, MD;  [facility] OR;  Service: Orthopedics;  Laterality: Right;  . KNEE ARTHROSCOPY WITH LATERAL RELEASE Right 12/13/2014   Procedure: KNEE ARTHROSCOPY WITH LATERAL RELEASE;  Surgeon: Thera Flake., MD;  Location: [facility];  Service: Orthopedics;  Laterality: Right;  . LAPAROSCOPIC APPENDECTOMY N/A 06/11/2014   Procedure: APPENDECTOMY LAPAROSCOPIC;  Surgeon: Velora Heckler, MD;  Location: [facility];  Service: General;  Laterality: N/A;  . ORIF ANKLE FRACTURE Right 01/05/2016   Procedure: OPEN REDUCTION INTERNAL FIXATION (ORIF) ANKLE FRACTURE;  Surgeon: Cammy Copa, MD;  Location: [facility] OR;  Service: Orthopedics;  Laterality: Right;  . OVARIAN CYST SURGERY Right 03/05/2007   AND CHROMOPERTUBATION  VIA LAPAROSCOPY  . ROBOTIC ASSISTED LAPAROSCOPIC HYSTERECTOMY AND SALPINGECTOMY Bilateral 08/27/2018   Procedure: XI ROBOTIC ASSISTED LAPAROSCOPIC HYSTERECTOMY AND BILATERAL SALPINGECTOMY;  Surgeon: Olivia Mackie, MD;  Location: [facility];  Service: Gynecology;  Laterality: Bilateral;  [facility] for 23hr OBS  . SYNOVECTOMY Right 12/13/2014   Procedure: PLICA SYNOVECTOMY;  Surgeon: Thera Flake., MD;  Location: [facility];  Service: Orthopedics;  Laterality: Right;  . TONSILLECTOMY AND ADENOIDECTOMY  child  . WISDOM TOOTH EXTRACTION       OB History     Gravida  2   Para  2   Term  1   Preterm  1   AB  0   Living  2     SAB  0   TAB  0   Ectopic  0   Multiple  0   Live Births  1            Home Medications    Prior to Admission medications   Medication Sig Start Date End Date Taking? Authorizing Provider  albuterol (VENTOLIN HFA) 108 (90 Base) MCG/ACT inhaler Inhale into the lungs. 03/01/19  Yes [provider]  ALPRAZolam Prudy Feeler) 1 MG tablet Take by mouth. 03/01/19 03/31/19 Yes [provider]  atenolol (TENORMIN) 25 MG tablet TAKE ONE TABLET BY MOUTH DAILY 11/30/18  Yes [provider]  chlorpheniramine-HYDROcodone (TUSSIONEX) 10-8 MG/5ML SUER Take by mouth. 03/16/19  Yes [provider]  fesoterodine (TOVIAZ)  4 MG TB24 tablet Take by mouth. 03/01/19  Yes [provider]  fluticasone-salmeterol (ADVAIR HFA) 115-21 MCG/ACT inhaler Inhale into the lungs. 03/16/19  Yes [provider]  levofloxacin (LEVAQUIN) 750 MG tablet Take by mouth. 03/16/19 03/23/19 Yes [provider]  meclizine (ANTIVERT) 25 MG tablet Take by mouth. 03/16/19 03/26/19 Yes [provider]  naloxone (NARCAN) nasal spray 4 mg/0.1 mL SMARTSIG:1 Spray(s) Both Nares Once 12/20/18  Yes [provider]  predniSONE (DELTASONE) 20 MG tablet Take by mouth. 03/16/19 03/23/19 Yes [provider]  zonisamide (ZONEGRAN) 25 MG capsule TAKE THREE CAPSULES BY MOUTH AT BEDTIME 03/16/19  Yes [provider]  ADDERALL XR 20 MG 24 hr capsule Take 20 mg by mouth every morning. 07/09/18   [provider]  albuterol (PROVENTIL HFA;VENTOLIN HFA) 108 (90 Base) MCG/ACT inhaler Inhale 2 puffs into the lungs every 6 (six) hours as needed for wheezing or shortness of breath.     [provider]  atenolol (TENORMIN) 25 MG tablet Take 25 mg by mouth daily.    [provider]  cyclobenzaprine (FLEXERIL) 10 MG tablet TAKE ONE TABLET BY MOUTH THREE TIMES DAILY AS NEEDED FOR  MUSCLE SPASMS 11/29/18   [provider]  cyclobenzaprine (FLEXERIL) 5 MG tablet Take 5 mg by mouth 3 (three) times daily as needed for muscle spasms.    [provider]  diazepam (VALIUM) 5 MG tablet Take 1 tablet (5 mg total) by mouth every 12 (twelve) hours as needed for anxiety. Patient not taking: Reported on 12/01/2018 11/05/16   Nelwyn Salisbury, MD  diclofenac sodium (VOLTAREN) 1 % GEL APPLY ONE APPLICATION FOUR TIMES DAILY AS NEEDED 11/05/18   [provider]  dicyclomine (BENTYL) 20 MG tablet TAKE ONE TABLET BY MOUTH 3 TIMES DAILY BEFORE MEALS Patient taking differently: Take 20 mg by mouth 4 (four) times daily -  before meals and at bedtime.  06/03/18   Meryl Dare, MD  dihydroergotamine (MIGRANAL) 4 MG/ML nasal spray Place into the nose.    [provider]  EMGALITY 120 MG/ML SOAJ Inject 120 mg into the skin every 30 (thirty) days. 08/02/18   [provider]  gabapentin (NEURONTIN) 300 MG capsule TAKE 2 CAPSULES BY MOUTH 3 TIMES DAILY Patient taking differently: Take 600-1,200 mg by mouth See admin instructions. Take  by mouth in the morning and 1,200mg  in the evening 05/26/17   Nelwyn Salisbury, MD  levocetirizine (XYZAL) 5 MG tablet Take 5 mg by mouth every evening. 11/23/18   [provider]  lidocaine (XYLOCAINE) 2 % solution Use as directed 15 mLs in the mouth or throat as needed for mouth pain.    [provider]  magic mouthwash SOLN Take 5 mLs by mouth 4 (four) times daily as needed for mouth pain.    [provider]  Magnesium 400 MG TABS Take 1,200 mg by mouth at bedtime.     [provider]  mirabegron ER (MYRBETRIQ) 25 MG TB24 tablet Take 25 mg by mouth at bedtime.    [provider]  montelukast (SINGULAIR) 10 MG tablet Take 10 mg by mouth at bedtime.     [provider]  Oxycodone HCl 10 MG TABS Take 10 mg by mouth 4 (four) times daily as needed. 01/13/19   [provider]  oxyCODONE-acetaminophen (PERCOCET/ROXICET) 5-325 MG tablet Take 1-2 tablets by mouth every 6 (six) hours as needed for severe pain. Patient not taking: Reported on 12/01/2018  08/28/18   Olivia Mackie, MD  pantoprazole (PROTONIX) 40 MG tablet TAKE ONE TABLET BY MOUTH TWICE DAILY Patient taking differently: Take 40 mg by mouth 2 (two) times daily.  11/26/17   Sheliah Hatch, MD  phentermine (ADIPEX-P) 37.5 MG tablet Take 37.5 mg by mouth daily. 12/17/18   [provider]  Polyethylene Glycol 3350 (MIRALAX PO) Take by mouth at bedtime.    [provider]  promethazine (PHENERGAN) 25 MG tablet Take 1 tablet (25 mg total) by mouth every 8 (eight) hours as needed for nausea or vomiting. 08/14/17   Waldon Merl, PA-C  pseudoephedrine-codeine-guaifenesin (MYTUSSIN DAC) 30-10-100 MG/5ML solution Take 10 mLs by mouth 4 (four) times daily as needed for cough.    [provider]  tiZANidine (ZANAFLEX) 4 MG tablet Take 4 mg by mouth every 6 (six) hours as needed for muscle spasms.  11/18/16   [provider]  traMADol (ULTRAM) 50 MG tablet Take 1 tablet (50 mg total) by mouth every 6 (six) hours as needed for moderate pain. Patient not taking: Reported on 12/01/2018 08/28/18   Olivia Mackie, MD  venlafaxine XR (EFFEXOR-XR) 150 MG 24 hr capsule Take 2 capsules (300 mg total) by mouth daily before breakfast. 11/06/16   Nelwyn Salisbury, MD    Family History Family History  Problem Relation Age of Onset  . Brain cancer Father   . Anesthesia problems Father        hard to wake up post-op  . Lung cancer Father   . Breast cancer Maternal Grandmother   . Ovarian cancer Maternal Grandmother   . Diabetes Maternal Grandmother   . Heart disease Maternal Grandmother   . Irritable bowel syndrome Mother   . Prostate cancer Maternal Grandfather   . Melanoma Maternal Grandfather   . Colon cancer Neg Hx     Social History Social History   Tobacco Use  . Smoking  status: Current Some Day Smoker    Packs/day: 0.50    Years: 15.00    Pack years: 7.50    Types: Cigarettes  . Smokeless tobacco: Never Used  Substance Use Topics  . Alcohol use: Yes    Alcohol/week: 0.0 standard drinks    Comment: occasionally - glass wine  . Drug use: No     Allergies   Nickel; Nsaids; Oxybutynin; Adhesive [tape]; Metoclopramide hcl; Prochlorperazine edisylate; and Sulfa antibiotics   Review of Systems Review of Systems  Constitutional: Positive for fatigue. Negative for chills, diaphoresis and fever.  HENT: Negative for congestion, rhinorrhea and sneezing.   Eyes: Negative.   Respiratory: Positive for cough and shortness of breath. Negative for chest tightness.   Cardiovascular: Positive for chest pain. Negative for leg swelling.  Gastrointestinal: Positive for nausea. Negative for abdominal pain, blood in stool, diarrhea and vomiting.  Genitourinary: Negative for difficulty urinating, flank pain, frequency and hematuria.  Musculoskeletal: Positive for myalgias. Negative for arthralgias and back pain.  Skin: Negative for rash.  Neurological: Positive for light-headedness. Negative for dizziness, speech difficulty, weakness, numbness and headaches.     Physical Exam Updated Vital Signs BP (!) 133/91   Pulse 67   Temp 98.3 F (36.8 C) (Oral)   Resp 15   Ht  (1.702 m)   Wt 95.3 kg   LMP 08/26/2018   SpO2 100%   BMI 32.89 kg/m   Physical Exam Constitutional:      Appearance: She is well-developed.  HENT:     Head: Normocephalic and atraumatic.  Eyes:     Pupils: Pupils are equal, round, and reactive to light.  Neck:     Musculoskeletal: Normal range of motion and neck supple.  Cardiovascular:     Rate and Rhythm: Normal rate and regular rhythm.     Heart sounds: Normal heart sounds.  Pulmonary:     Effort: Pulmonary effort is normal. No respiratory distress.     Breath sounds: Normal breath sounds. No wheezing or rales.  Chest:      Chest wall: Tenderness present.  Abdominal:     General: Bowel sounds are normal.     Palpations: Abdomen is soft.     Tenderness: There is no abdominal tenderness. There is no guarding or rebound.  Musculoskeletal: Normal range of motion.     Right lower leg: No edema.     Left lower leg: No edema.  Lymphadenopathy:     Cervical: No cervical adenopathy.  Skin:    General: Skin is warm and dry.     Findings: No rash.  Neurological:     Mental Status: She is alert and oriented to person, place, and time.      ED Treatments / Results  Labs (all labs ordered are listed, but only abnormal results are displayed) Labs Reviewed  BASIC METABOLIC PANEL - Abnormal; Notable for the following components:      Result Value   Glucose, Bld 138 (*)    Calcium 8.8 (*)    All other components within normal limits  CBC WITH DIFFERENTIAL/PLATELET - Abnormal; Notable for the following components:   RBC 3.82 (*)    Hemoglobin 11.2 (*)    HCT 34.8 (*)    Neutro Abs 7.8 (*)    All other components within normal limits  TROPONIN I    EKG None  Radiology Dg Chest 2 View  Result Date: 03/18/2019 CLINICAL DATA:  Shortness of breath, nausea for 1 week EXAM: CHEST - 2 VIEW COMPARISON:  12/01/2018 FINDINGS: The heart size and mediastinal contours are within normal limits. Both lungs are clear. The visualized skeletal structures are unremarkable. IMPRESSION: No active cardiopulmonary disease. Electronically Signed   By: Elige Ko   On: 03/18/2019 21:01    Procedures Procedures (including critical care time)  Medications Ordered in ED Medications  sodium chloride 0.9 % bolus 1,000 mL (1,000 mLs Intravenous New Bag/Given 03/18/19 2012)  ondansetron (ZOFRAN) injection 4 mg (4 mg Intravenous Given 03/18/19 2016)     Initial Impression / Assessment and Plan / ED Course  I have reviewed the triage vital signs and the nursing notes.  Pertinent labs & imaging results that were available during my  care of the patient were reviewed by me and considered in my medical decision making (see chart for details).        Patient is a 38 year old female who presents with flulike illness.  She states that she is very fatigued and has not been able to get good oral intake.  She feels dehydrated.  She feels much better after IV fluids in the ED.  Her vital signs are stable.  She feels short of breath but she has no hypoxia or increased work of breathing.  No tachypnea.  Her chest x-ray is clear without evidence of pneumonia.  Her labs are non-concerning.  She is tolerating oral fluids.  She was discharged home in good condition.  Return precautions were given.  Diane [name] was evaluated in Emergency Department on 03/18/2019 for the symptoms described in the  history of present illness. She was evaluated in the context of the global COVID-19 pandemic, which necessitated consideration that the patient might be at risk for infection with the SARS-CoV-2 virus that causes COVID-19. Institutional protocols and algorithms that pertain to the evaluation of patients at risk for COVID-19 are in a state of rapid change based on information released by regulatory bodies including the CDC and federal and state organizations. These policies and algorithms were followed during the patient's care in the ED.   Final Clinical Impressions(s) / ED Diagnoses   Final diagnoses:  Influenza-like illness    ED Discharge Orders    None       Rolan BuccoBelfi, Nedim Oki, MD 03/18/19 2122

## 2019-04-05 ENCOUNTER — Emergency Department (HOSPITAL_COMMUNITY): Payer: 59

## 2019-04-05 ENCOUNTER — Other Ambulatory Visit: Payer: Self-pay

## 2019-04-05 ENCOUNTER — Encounter (HOSPITAL_COMMUNITY): Payer: Self-pay | Admitting: Family Medicine

## 2019-04-05 ENCOUNTER — Emergency Department (HOSPITAL_COMMUNITY)
Admission: EM | Admit: 2019-04-05 | Discharge: 2019-04-05 | Disposition: A | Discharge status: Home / Self Care | Payer: 59 | Attending: Emergency Medicine | Admitting: Emergency Medicine

## 2019-04-05 ENCOUNTER — Emergency Department (HOSPITAL_COMMUNITY)
Admission: EM | Admit: 2019-04-05 | Discharge: 2019-04-06 | Disposition: A | Discharge status: Home / Self Care | Payer: 59 | Source: Home / Self Care | Attending: Emergency Medicine | Admitting: Emergency Medicine

## 2019-04-05 ENCOUNTER — Encounter (HOSPITAL_COMMUNITY): Payer: Self-pay | Admitting: Emergency Medicine

## 2019-04-05 DIAGNOSIS — K5900 Constipation, unspecified: Secondary | ICD-10-CM | POA: Insufficient documentation

## 2019-04-05 DIAGNOSIS — F17210 Nicotine dependence, cigarettes, uncomplicated: Secondary | ICD-10-CM | POA: Insufficient documentation

## 2019-04-05 DIAGNOSIS — R109 Unspecified abdominal pain: Secondary | ICD-10-CM | POA: Diagnosis present

## 2019-04-05 DIAGNOSIS — K921 Melena: Secondary | ICD-10-CM | POA: Insufficient documentation

## 2019-04-05 DIAGNOSIS — R197 Diarrhea, unspecified: Secondary | ICD-10-CM

## 2019-04-05 DIAGNOSIS — Z5321 Procedure and treatment not carried out due to patient leaving prior to being seen by health care provider: Secondary | ICD-10-CM | POA: Insufficient documentation

## 2019-04-05 LAB — COMPREHENSIVE METABOLIC PANEL
ALT: 16 U/L (ref 0–44)
AST: 20 U/L (ref 15–41)
Albumin: 3.7 g/dL (ref 3.5–5.0)
Alkaline Phosphatase: 73 U/L (ref 38–126)
Anion gap: 8 (ref 5–15)
BUN: 8 mg/dL (ref 6–20)
CO2: 24 mmol/L (ref 22–32)
Calcium: 9.3 mg/dL (ref 8.9–10.3)
Chloride: 103 mmol/L (ref 98–111)
Creatinine, Ser: 0.76 mg/dL (ref 0.44–1.00)
GFR calc Af Amer: >60 mL/min (ref >60)
GFR calc non Af Amer: >60 mL/min (ref >60)
Glucose, Bld: 98 mg/dL (ref 70–99)
Potassium: 4.2 mmol/L (ref 3.5–5.1)
Sodium: 135 mmol/L (ref 135–145)
Total Bilirubin: 0.3 mg/dL (ref 0.3–1.2)
Total Protein: 6.7 g/dL (ref 6.5–8.1)

## 2019-04-05 LAB — URINALYSIS, ROUTINE W REFLEX MICROSCOPIC
Bilirubin Urine: NEGATIVE
Glucose, UA: NEGATIVE mg/dL
Hgb urine dipstick: NEGATIVE
Ketones, ur: NEGATIVE mg/dL
Leukocytes,Ua: NEGATIVE
Nitrite: NEGATIVE
Protein, ur: NEGATIVE mg/dL
Specific Gravity, Urine: 1.02 (ref 1.005–1.030)
pH: 5 (ref 5.0–8.0)

## 2019-04-05 LAB — CBC
HCT: 36.7 % (ref 36.0–46.0)
Hemoglobin: 12.1 g/dL (ref 12.0–15.0)
MCH: 29.8 pg (ref 26.0–34.0)
MCHC: 33 g/dL (ref 30.0–36.0)
MCV: 90.4 fL (ref 80.0–100.0)
Platelets: 235 10*3/uL (ref 150–400)
RBC: 4.06 MIL/uL (ref 3.87–5.11)
RDW: 12.1 % (ref 11.5–15.5)
WBC: 7.3 10*3/uL (ref 4.0–10.5)
nRBC: 0 % (ref 0.0–0.2)

## 2019-04-05 LAB — WET PREP, GENITAL
Clue Cells Wet Prep HPF POC: NONE SEEN
Sperm: NONE SEEN
Trich, Wet Prep: NONE SEEN
WBC, Wet Prep HPF POC: NONE SEEN
Yeast Wet Prep HPF POC: NONE SEEN

## 2019-04-05 LAB — I-STAT BETA HCG BLOOD, ED (MC, WL, AP ONLY): I-stat hCG, quantitative: <5 m[IU]/mL (ref <5)

## 2019-04-05 LAB — LIPASE, BLOOD: Lipase: 33 U/L (ref 11–51)

## 2019-04-05 MED ORDER — FAMOTIDINE 20 MG/2ML IV SOLN
20.00 | INTRAVENOUS | Status: DC
Start: 2019-04-04 — End: 2019-04-05

## 2019-04-05 MED ORDER — ONDANSETRON HCL 4 MG/2ML IJ SOLN
4.0000 mg | Freq: Once | INTRAMUSCULAR | Status: AC
  Administered 2019-04-05: 4 mg via INTRAVENOUS
  Filled 2019-04-05: qty 2

## 2019-04-05 MED ORDER — SODIUM CHLORIDE 0.9 % IV BOLUS
1000.0000 mL | Freq: Once | INTRAVENOUS | Status: AC
  Administered 2019-04-05: 1000 mL via INTRAVENOUS

## 2019-04-05 MED ORDER — SODIUM CHLORIDE 0.9% FLUSH: 3.0000 mL | Freq: Once | INTRAVENOUS | Status: DC

## 2019-04-05 MED ORDER — SODIUM CHLORIDE (PF) 0.9 % IJ SOLN
INTRAMUSCULAR | Status: AC
  Filled 2019-04-05: qty 50

## 2019-04-05 MED ORDER — DIPHENHYDRAMINE HCL 50 MG/ML IJ SOLN
25.0000 mg | Freq: Once | INTRAMUSCULAR | Status: DC
  Filled 2019-04-05: qty 1

## 2019-04-05 MED ORDER — AMOXICILLIN-POT CLAVULANATE 875-125 MG PO TABS: 1.0000 | ORAL_TABLET | Freq: Two times a day (BID) | ORAL | 0 refills | Status: AC

## 2019-04-05 MED ORDER — MORPHINE SULFATE (PF) 2 MG/ML IV SOLN
2.0000 mg | Freq: Once | INTRAVENOUS | Status: AC
  Administered 2019-04-05: 2 mg via INTRAVENOUS
  Filled 2019-04-05: qty 1

## 2019-04-05 MED ORDER — IOHEXOL 300 MG/ML  SOLN
100.0000 mL | Freq: Once | INTRAMUSCULAR | Status: AC | PRN
  Administered 2019-04-05: 100 mL via INTRAVENOUS

## 2019-04-05 MED ORDER — AMOXICILLIN-POT CLAVULANATE 875-125 MG PO TABS
1.0000 | ORAL_TABLET | Freq: Once | ORAL | Status: AC
  Administered 2019-04-06: 1 via ORAL
  Filled 2019-04-05: qty 1

## 2019-04-05 NOTE — ED Provider Notes (Addendum)
[facility] Emergency Department Provider Note MRN:  [id number]  Arrival date & time: 04/05/19     Chief Complaint   Rectal Bleeding   History of Present Illness   Diane [name] is a 38 y.o. year-old female with a history of IBS, GERD presenting to the ED with chief complaint of rectal bleeding.  Patient explains she had an extended viral or flulike illness lasting 1 to 2 weeks, though symptoms largely improving.  For the past 4 to 5 days, experiencing continued fever, general malaise, lower abdominal pain, straining with urination, and bloody stools.  Denies chest pain or shortness of breath, no upper abdominal pain, no vaginal bleeding or discharge.  Symptoms are moderate in severity, constant, no exacerbating relieving factors.  Review of Systems  A complete 10 system review of systems was obtained and all systems are negative except as noted in the HPI and PMH.   Patient's Health History    Past Medical History:  Diagnosis Date  . ADD (attention deficit disorder with hyperactivity)   . Anemia    takes iron supplement  . Anxiety   . Choking    due to food and pills get stuck  . Depression   . Dysmenorrhea   . Family history of adverse reaction to anesthesia    pt's father has hx. of being hard to wake up post-op  . GERD (gastroesophageal reflux disease)   . History of gastric ulcer   . History of kidney stones   . IBS (irritable bowel syndrome)   . Migraines    neurologist-  dr c. Sharene Skeans ([facility] in [place])-- treated with Emgality injection every 30 days  . Mild obstructive sleep apnea    per study in epic 03-21-2017 mild osa, recommendation cpap, mouth appliance, wt loss  . Osteoarthritis    right ankle  . Osteochondral lesion 09/2016   right ankle  . Osteomyelitis of knee region [facility])   . Peripheral vascular disease (HCC)    NERVE DAMAGE RIGHT LEG AND FOOT DUE TO INJURGY.   Marland Kitchen Plantar fasciitis of left foot   . PONV  (postoperative nausea and vomiting)    AND HEADACHES  . Pseudotumor cerebri    at back head  . S/P dilatation of esophageal stricture 07/2018  . Sinusitis 08/12/2018   RESOLVED WITH ANTIOBIOTIC  . SUI (stress urinary incontinence, female)   . Wears contact lenses   . Wears partial dentures    upper    Past Surgical History:  Procedure Laterality Date  . ANKLE ARTHROSCOPY Right 10/22/2016   Procedure: ANKLE ARTHROSCOPY;  Surgeon: Vivi Barrack, DPM;  Location: [facility];  Service: Podiatry;  Laterality: Right;  GENERAL/REG BLOCK  . ANKLE ARTHROSCOPY Right 09-15-2017   @[facility]   . BLADDER SURGERY  child   x 2 - as a child to stretch bladder  . CESAREAN SECTION  12/24/2009  . CESAREAN SECTION  11/20/2011   Procedure: CESAREAN SECTION;  Surgeon: Lenoard Aden, MD;  Location: [facility];  Service: Gynecology;  Laterality: N/A;  . CHOLECYSTECTOMY N/A 12/03/2016   Procedure: LAPAROSCOPIC CHOLECYSTECTOMY WITH INTRAOPERATIVE CHOLANGIOGRAM;  Surgeon: Darnell Level, MD;  Location: [facility];  Service: General;  Laterality: N/A;  . CYSTOSCOPY WITH RETROGRADE PYELOGRAM, URETEROSCOPY AND STENT PLACEMENT Left 08/10/2006  . ESOPHAGOGASTRODUODENOSCOPY (EGD) WITH ESOPHAGEAL DILATION  07/2018  . HARDWARE REMOVAL Right 07/01/2016   Procedure: HARDWARE REMOVAL;  Surgeon: Cammy Copa, MD;  Location: [facility] OR;  Service: Orthopedics;  Laterality: Right;  . KNEE ARTHROSCOPY WITH LATERAL RELEASE Right 12/13/2014   Procedure: KNEE ARTHROSCOPY WITH LATERAL RELEASE;  Surgeon: Thera Flake., MD;  Location: [facility];  Service: Orthopedics;  Laterality: Right;  . LAPAROSCOPIC APPENDECTOMY N/A 06/11/2014   Procedure: APPENDECTOMY LAPAROSCOPIC;  Surgeon: Velora Heckler, MD;  Location: [facility];  Service: General;  Laterality: N/A;  . ORIF ANKLE FRACTURE Right 01/05/2016   Procedure: OPEN REDUCTION INTERNAL FIXATION (ORIF) ANKLE FRACTURE;  Surgeon: Cammy Copa, MD;  Location: [facility];   Service: Orthopedics;  Laterality: Right;  . OVARIAN CYST SURGERY Right 03/05/2007   AND CHROMOPERTUBATION  VIA LAPAROSCOPY  . ROBOTIC ASSISTED LAPAROSCOPIC HYSTERECTOMY AND SALPINGECTOMY Bilateral 08/27/2018   Procedure: XI ROBOTIC ASSISTED LAPAROSCOPIC HYSTERECTOMY AND BILATERAL SALPINGECTOMY;  Surgeon: Olivia Mackie, MD;  Location: [facility];  Service: Gynecology;  Laterality: Bilateral;  [facility] for 23hr OBS  . SYNOVECTOMY Right 12/13/2014   Procedure: PLICA SYNOVECTOMY;  Surgeon: Thera Flake., MD;  Location: [facility];  Service: Orthopedics;  Laterality: Right;  . TONSILLECTOMY AND ADENOIDECTOMY  child  . WISDOM TOOTH EXTRACTION      Family History  Problem Relation Age of Onset  . Brain cancer Father   . Anesthesia problems Father        hard to wake up post-op  . Lung cancer Father   . Breast cancer Maternal Grandmother   . Ovarian cancer Maternal Grandmother   . Diabetes Maternal Grandmother   . Heart disease Maternal Grandmother   . Irritable bowel syndrome Mother   . Prostate cancer Maternal Grandfather   . Melanoma Maternal Grandfather   . Colon cancer Neg Hx     Social History   Socioeconomic History  . Marital status: Married    Spouse name: Not on file  . Number of children: 2  . Years of education: Not on file  . Highest education level: Not on file  Occupational History  . Occupation: Investment banker, 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  Social Needs  . Financial resource strain: Not on file  . Food insecurity:    Worry: Not on file    Inability: Not on file  . Transportation needs:    Medical: Not on file    Non-medical: Not on file  Tobacco Use  . Smoking status: Current Some Day Smoker    Packs/day: 0.50    Years: 15.00    Pack years: 7.50    Types: Cigarettes  . Smokeless tobacco: Never Used  Substance and Sexual Activity  . Alcohol use: Yes    Alcohol/week: 0.0 standard drinks    Comment: Over 6-7 months of drinking   .  Drug use: No  . Sexual activity: Not on file  Lifestyle  . Physical activity:    Days per week: Not on file    Minutes per session: Not on file  . Stress: Not on file  Relationships  . Social connections:    Talks on phone: Not on file    Gets together: Not on file    Attends religious service: Not on file    Active member of club or organization: Not on file    Attends meetings of clubs or organizations: Not on file    Relationship status: Not on file  . Intimate partner violence:    Fear of current or ex partner: Not on file    Emotionally abused: Not on file  Physically abused: Not on file    Forced sexual activity: Not on file  Other Topics Concern  . Not on file  Social History Narrative  . Not on file     Physical Exam  Vital Signs and Nursing Notes reviewed Vitals:   04/05/19 2110  BP: (!) 107/91  Pulse: 80  Resp: 16  Temp: 98.1 F (36.7 C)  SpO2: 97%    CONSTITUTIONAL: Well-appearing, NAD NEURO:  Alert and oriented x 3, no focal deficits EYES:  eyes equal and reactive ENT/NECK:  no LAD, no JVD CARDIO: Regular rate, well-perfused, normal S1 and S2 PULM:  CTAB no wheezing or rhonchi GI/GU:  normal bowel sounds, moderately distended, moderate suprapubic tenderness to palpation; normal-appearing external genitalia, no adnexal masses or tenderness, no cervical motion tenderness, no blood in the vaginal vault, small amount of white mucus, exam chaperoned by female nurse MSK/SPINE:  No gross deformities, no edema SKIN:  no rash, atraumatic PSYCH:  Appropriate speech and behavior  Diagnostic and Interventional Summary    Labs Reviewed  WET PREP, GENITAL  GC/CHLAMYDIA PROBE AMP ([place]) NOT AT [facility]    CT ABDOMEN PELVIS W CONTRAST    (Results Pending)    Medications  diphenhydrAMINE (BENADRYL) injection 25 mg (has no administration in time range)  sodium chloride (PF) 0.9 % injection (has no administration in time range)  iohexol (OMNIPAQUE) 300 MG/ML  solution 100 mL (has no administration in time range)  ondansetron (ZOFRAN) injection 4 mg (4 mg Intravenous Given 04/05/19 2249)  morphine 2 MG/ML injection 2 mg (2 mg Intravenous Given 04/05/19 2250)  sodium chloride 0.9 % bolus 1,000 mL (1,000 mLs Intravenous New Bag/Given 04/05/19 2252)     Procedures  Anoscopy An anoscopy was performed with the indication of rectal bleeding.  Findings: No evidence of internal or external hemorrhoids, no gross blood, small amount of mucoid stool.  Critical Care  ED Course and Medical Decision Making  I have reviewed the triage vital signs and the nursing notes.  Pertinent labs & imaging results that were available during my care of the patient were reviewed by me and considered in my medical decision making (see below for details).  Favoring infectious colitis given patient's reported fever, lower abdominal pain, bloody diarrhea.  Vital signs are normal, moderate lower abdominal tenderness.  Shared decision making with patient with regard to imaging.  Options would be empiric treatment of colitis without CT or obtaining CT scan today for further evaluation.  Patient elects for CT scan today.  Was registered at [facility] shortly prior to arrival with labs already resulted from [place], which are all within normal limits, hCG negative, normal lipase, urinalysis without signs of infection, CMP normal.  H&H is reassuring.  Awaiting CT.  If unremarkable, will discharge with Augmentin for empiric treatment of bloody diarrhea and concern for bacterial colitis.  Would avoid prescriptions for controlled substances given patient's documented history of narcotic dependence and multiple recent sedating prescriptions.  CT is without acute findings, advise increase MiraLAX at home given the findings of constipation.  Given the reported fever and bloody stool, will provide the Augmentin prescription.  After the discussed management above, the patient was determined to be  safe for discharge.  The patient was in agreement with this plan and all questions regarding their care were answered.  ED return precautions were discussed and the patient will return to the ED with any significant worsening of condition.   Elmer Sow. Pilar Plate, MD [facility]  Emergency Medicine [facility] [email or web address]  Final Clinical Impressions(s) / ED Diagnoses     ICD-10-CM   1. Bloody diarrhea R19.7     ED Discharge Orders    None         Sabas SousBero, Nykia Turko M, MD 04/05/19 2307    Sabas SousBero, Alania Overholt M, MD 04/05/19 [phone]

## 2019-04-05 NOTE — ED Notes (Signed)
Pt approached this tech really upset about the fact that she was put back into the waiting room to wait stating "I don't think you understand, I am bleeding." This tech tried to explain the process but the pt would not listen. Pt walked out and is now on the phone walking around. RN, Pattricia Boss, notified.

## 2019-04-05 NOTE — ED Notes (Signed)
Will hold on performing standing orders since patient reports she was just at [facility] and had blood drawn. She states she left there because of the wait time.

## 2019-04-05 NOTE — Discharge Instructions (Addendum)
You were evaluated in the Emergency Department and after careful evaluation, we did not find any emergent condition requiring admission or further testing in the hospital.  Your symptoms today seem to be due to colitis.  Please take the antibiotics as directed.  And please increase your MiraLAX at home as discussed.  Please return to the Emergency Department if you experience any worsening of your condition.  We encourage you to follow up with a primary care provider.  Thank you for allowing [place] to be a part of your care.

## 2019-04-05 NOTE — ED Triage Notes (Signed)
Pt reports abd cramping, n/v for about a week. Pt reports rectal bleeding that started 2-3 days ago. Pt reports she has had 3 neg covid tests. Pt reports fevers.

## 2019-04-05 NOTE — ED Triage Notes (Signed)
Patient is complaining of lower abd pain with rectal bleeding. Associated symptoms are nausea, vomiting, diarrhea, and fever. Patient reports the abd pain got worse two days ago with rectal bleeding that appears bright red from pictures of her pictures on her cell phone.

## 2019-04-07 LAB — GC/CHLAMYDIA PROBE AMP (~~LOC~~) NOT AT ARMC
Chlamydia: NEGATIVE
Neisseria Gonorrhea: NEGATIVE

## 2019-04-15 ENCOUNTER — Emergency Department (HOSPITAL_COMMUNITY)
Admission: EM | Admit: 2019-04-15 | Discharge: 2019-04-16 | Disposition: A | Discharge status: Home / Self Care | Payer: 59 | Attending: Emergency Medicine | Admitting: Emergency Medicine

## 2019-04-15 ENCOUNTER — Other Ambulatory Visit: Payer: Self-pay

## 2019-04-15 DIAGNOSIS — Z79899 Other long term (current) drug therapy: Secondary | ICD-10-CM | POA: Insufficient documentation

## 2019-04-15 DIAGNOSIS — G894 Chronic pain syndrome: Secondary | ICD-10-CM | POA: Diagnosis not present

## 2019-04-15 DIAGNOSIS — R531 Weakness: Secondary | ICD-10-CM

## 2019-04-15 DIAGNOSIS — T50904A Poisoning by unspecified drugs, medicaments and biological substances, undetermined, initial encounter: Secondary | ICD-10-CM | POA: Insufficient documentation

## 2019-04-15 DIAGNOSIS — M549 Dorsalgia, unspecified: Secondary | ICD-10-CM | POA: Diagnosis present

## 2019-04-15 DIAGNOSIS — F17210 Nicotine dependence, cigarettes, uncomplicated: Secondary | ICD-10-CM | POA: Diagnosis not present

## 2019-04-15 NOTE — ED Triage Notes (Signed)
Pt arrived via EMS with complaints of recurrent hypotension, blurred vision, and weakness over the last month. Has spoken to primary care but they keep telling her to come to the ER.

## 2019-04-15 NOTE — ED Notes (Signed)
Bed: RESB Expected date:  Expected time:  Means of arrival:  Comments: EMS hypotension

## 2019-04-16 LAB — I-STAT BETA HCG BLOOD, ED (MC, WL, AP ONLY): I-stat hCG, quantitative: <5 m[IU]/mL (ref <5)

## 2019-04-16 LAB — COMPREHENSIVE METABOLIC PANEL
ALT: 17 U/L (ref 0–44)
AST: 22 U/L (ref 15–41)
Albumin: 3.7 g/dL (ref 3.5–5.0)
Alkaline Phosphatase: 68 U/L (ref 38–126)
Anion gap: 8 (ref 5–15)
BUN: 13 mg/dL (ref 6–20)
CO2: 26 mmol/L (ref 22–32)
Calcium: 8.9 mg/dL (ref 8.9–10.3)
Chloride: 104 mmol/L (ref 98–111)
Creatinine, Ser: 0.78 mg/dL (ref 0.44–1.00)
GFR calc Af Amer: >60 mL/min (ref >60)
GFR calc non Af Amer: >60 mL/min (ref >60)
Glucose, Bld: 98 mg/dL (ref 70–99)
Potassium: 4.2 mmol/L (ref 3.5–5.1)
Sodium: 138 mmol/L (ref 135–145)
Total Bilirubin: 0.1 mg/dL — ABNORMAL LOW (ref 0.3–1.2)
Total Protein: 6.9 g/dL (ref 6.5–8.1)

## 2019-04-16 LAB — CBC WITH DIFFERENTIAL/PLATELET
Abs Immature Granulocytes: 0.01 10*3/uL (ref 0.00–0.07)
Basophils Absolute: 0 10*3/uL (ref 0.0–0.1)
Basophils Relative: 1 %
Eosinophils Absolute: 0.4 10*3/uL (ref 0.0–0.5)
Eosinophils Relative: 7 %
HCT: 36.6 % (ref 36.0–46.0)
Hemoglobin: 11.7 g/dL — ABNORMAL LOW (ref 12.0–15.0)
Immature Granulocytes: 0 %
Lymphocytes Relative: 48 %
Lymphs Abs: 2.7 10*3/uL (ref 0.7–4.0)
MCH: 29.8 pg (ref 26.0–34.0)
MCHC: 32 g/dL (ref 30.0–36.0)
MCV: 93.1 fL (ref 80.0–100.0)
Monocytes Absolute: 0.5 10*3/uL (ref 0.1–1.0)
Monocytes Relative: 9 %
Neutro Abs: 1.9 10*3/uL (ref 1.7–7.7)
Neutrophils Relative %: 35 %
Platelets: 219 10*3/uL (ref 150–400)
RBC: 3.93 MIL/uL (ref 3.87–5.11)
RDW: 12.4 % (ref 11.5–15.5)
WBC: 5.4 10*3/uL (ref 4.0–10.5)
nRBC: 0 % (ref 0.0–0.2)

## 2019-04-16 LAB — RAPID URINE DRUG SCREEN, HOSP PERFORMED
Amphetamines: POSITIVE — AB
Barbiturates: NOT DETECTED
Benzodiazepines: POSITIVE — AB
Cocaine: NOT DETECTED
Opiates: POSITIVE — AB
Tetrahydrocannabinol: NOT DETECTED

## 2019-04-16 LAB — URINALYSIS, ROUTINE W REFLEX MICROSCOPIC
Bilirubin Urine: NEGATIVE
Glucose, UA: NEGATIVE mg/dL
Hgb urine dipstick: NEGATIVE
Ketones, ur: NEGATIVE mg/dL
Leukocytes,Ua: NEGATIVE
Nitrite: NEGATIVE
Protein, ur: NEGATIVE mg/dL
Specific Gravity, Urine: 1.015 (ref 1.005–1.030)
pH: 5 (ref 5.0–8.0)

## 2019-04-16 LAB — ETHANOL: Alcohol, Ethyl (B): <10 mg/dL (ref <10)

## 2019-04-16 NOTE — ED Provider Notes (Signed)
[facility]-EMERGENCY DEPT Provider Note: Lowella Dell, MD, FACEP  CSN: [id number] MRN: [id number] ARRIVAL: 04/15/19 at 2347 ROOM: RESB/RESB   CHIEF COMPLAINT  Hypotension   HISTORY OF PRESENT ILLNESS  04/16/19 12:26 AM Diane [name] is a 38 y.o. female who has chronic sciatica, knee and ankle pain.  She is on Adderall XR, Percocet 90 tablets/month, Belbuca 30 doses per month and Xanax 1 mg 30 tablets/month.  She complains of about 6 weeks of back pain and abdominal pain.  She also complains of nausea, vomiting and diarrhea for the past 3 weeks stating she has been I am able to keep anything down.  She has had multiple ED visits in the past month for influenza-like illness, bloody diarrhea and abdominal pain.  She had a CT scan of the abdomen and pelvis on the 18th of this month which was unremarkable with no evidence of bowel inflammation.  She states she has also seen her PCP at [facility] in [place].  She admits to taking her narcotics and Valium (NB: She has not been prescribed Valium per the state controlled substances database) but insists she does not take them together.  She volunteered to her nurse that she is not a "drug seeker" despite not being asked the question.  She is here because she was having difficulty ambulating at home.  She felt she was going to pass out and was bumping into things.  She felt that she was confused.  EMS reports she was initially hypotensive in the 80s but her blood pressure on arrival here was 108/77.  She has been somnolent but arousable.  She currently rates her pain as a 6 out of 10, primarily in the abdomen.  Past Medical History:  Diagnosis Date  . ADD (attention deficit disorder with hyperactivity)   . Anemia    takes iron supplement  . Anxiety   . Choking    due to food and pills get stuck  . Depression   . Dysmenorrhea   . Family history of adverse reaction to anesthesia    pt's father has hx. of being hard to wake up post-op  . GERD  (gastroesophageal reflux disease)   . History of gastric ulcer   . History of kidney stones   . IBS (irritable bowel syndrome)   . Migraines    neurologist-  dr c. Sharene Skeans ([facility] in [place])-- treated with Emgality injection every 30 days  . Mild obstructive sleep apnea    per study in epic 03-21-2017 mild osa, recommendation cpap, mouth appliance, wt loss  . Osteoarthritis    right ankle  . Osteochondral lesion 09/2016   right ankle  . Osteomyelitis of knee region [facility])   . Peripheral vascular disease (HCC)    NERVE DAMAGE RIGHT LEG AND FOOT DUE TO INJURGY.   Marland Kitchen Plantar fasciitis of left foot   . PONV (postoperative nausea and vomiting)    AND HEADACHES  . Pseudotumor cerebri    at back head  . S/P dilatation of esophageal stricture 07/2018  . Sinusitis 08/12/2018   RESOLVED WITH ANTIOBIOTIC  . SUI (stress urinary incontinence, female)   . Wears contact lenses   . Wears partial dentures    upper    Past Surgical History:  Procedure Laterality Date  . ANKLE ARTHROSCOPY Right 10/22/2016   Procedure: ANKLE ARTHROSCOPY;  Surgeon: Vivi Barrack, DPM;  Location: [facility];  Service: Podiatry;  Laterality: Right;  GENERAL/REG BLOCK  . ANKLE ARTHROSCOPY  Right 09-15-2017     . BLADDER SURGERY  child   x 2 - as a child to stretch bladder  . CESAREAN SECTION  12/24/2009  . CESAREAN SECTION  11/20/2011   Procedure: CESAREAN SECTION;  Surgeon: Lenoard Aden, MD;  Location: [facility];  Service: Gynecology;  Laterality: N/A;  . CHOLECYSTECTOMY N/A 12/03/2016   Procedure: LAPAROSCOPIC CHOLECYSTECTOMY WITH INTRAOPERATIVE CHOLANGIOGRAM;  Surgeon: Darnell Level, MD;  Location: [facility];  Service: General;  Laterality: N/A;  . CYSTOSCOPY WITH RETROGRADE PYELOGRAM, URETEROSCOPY AND STENT PLACEMENT Left 08/10/2006  . ESOPHAGOGASTRODUODENOSCOPY (EGD) WITH ESOPHAGEAL DILATION  07/2018  . HARDWARE REMOVAL Right 07/01/2016   Procedure: HARDWARE REMOVAL;  Surgeon:  Cammy Copa, MD;  Location: [facility] OR;  Service: Orthopedics;  Laterality: Right;  . KNEE ARTHROSCOPY WITH LATERAL RELEASE Right 12/13/2014   Procedure: KNEE ARTHROSCOPY WITH LATERAL RELEASE;  Surgeon: Thera Flake., MD;  Location: [facility];  Service: Orthopedics;  Laterality: Right;  . LAPAROSCOPIC APPENDECTOMY N/A 06/11/2014   Procedure: APPENDECTOMY LAPAROSCOPIC;  Surgeon: Velora Heckler, MD;  Location: [facility];  Service: General;  Laterality: N/A;  . ORIF ANKLE FRACTURE Right 01/05/2016   Procedure: OPEN REDUCTION INTERNAL FIXATION (ORIF) ANKLE FRACTURE;  Surgeon: Cammy Copa, MD;  Location: [facility];  Service: Orthopedics;  Laterality: Right;  . OVARIAN CYST SURGERY Right 03/05/2007   AND CHROMOPERTUBATION  VIA LAPAROSCOPY  . ROBOTIC ASSISTED LAPAROSCOPIC HYSTERECTOMY AND SALPINGECTOMY Bilateral 08/27/2018   Procedure: XI ROBOTIC ASSISTED LAPAROSCOPIC HYSTERECTOMY AND BILATERAL SALPINGECTOMY;  Surgeon: Olivia Mackie, MD;  Location: [facility];  Service: Gynecology;  Laterality: Bilateral;  [facility] for 23hr OBS  . SYNOVECTOMY Right 12/13/2014   Procedure: PLICA SYNOVECTOMY;  Surgeon: Thera Flake., MD;  Location: [facility];  Service: Orthopedics;  Laterality: Right;  . TONSILLECTOMY AND ADENOIDECTOMY  child  . WISDOM TOOTH EXTRACTION      Family History  Problem Relation Age of Onset  . Brain cancer Father   . Anesthesia problems Father        hard to wake up post-op  . Lung cancer Father   . Breast cancer Maternal Grandmother   . Ovarian cancer Maternal Grandmother   . Diabetes Maternal Grandmother   . Heart disease Maternal Grandmother   . Irritable bowel syndrome Mother   . Prostate cancer Maternal Grandfather   . Melanoma Maternal Grandfather   . Colon cancer Neg Hx     Social History   Tobacco Use  . Smoking status: Current Some Day Smoker    Packs/day: 0.50    Years: 15.00    Pack years: 7.50    Types: Cigarettes  . Smokeless  tobacco: Never Used  Substance Use Topics  . Alcohol use: Yes    Alcohol/week: 0.0 standard drinks    Comment: Over 6-7 months of drinking   . Drug use: No    Prior to Admission medications   Medication Sig Start Date End Date Taking? Authorizing Provider  ADDERALL XR 20 MG 24 hr capsule Take 20 mg by mouth every morning. 07/09/18   [provider]  albuterol (PROVENTIL HFA;VENTOLIN HFA) 108 (90 Base) MCG/ACT inhaler Inhale 2 puffs into the lungs every 6 (six) hours as needed for wheezing or shortness of breath.     [provider]  albuterol (VENTOLIN HFA) 108 (90 Base) MCG/ACT inhaler Inhale into the lungs. 03/01/19   [provider]  atenolol (TENORMIN) 25 MG tablet Take 25 mg by mouth  daily.    [provider]  atenolol (TENORMIN) 25 MG tablet TAKE ONE TABLET BY MOUTH DAILY 11/30/18   [provider]  chlorpheniramine-HYDROcodone (TUSSIONEX) 10-8 MG/5ML SUER Take by mouth. 03/16/19   [provider]  cyclobenzaprine (FLEXERIL) 10 MG tablet TAKE ONE TABLET BY MOUTH THREE TIMES DAILY AS NEEDED FOR MUSCLE SPASMS 11/29/18   [provider]  cyclobenzaprine (FLEXERIL) 5 MG tablet Take 5 mg by mouth 3 (three) times daily as needed for muscle spasms.    [provider]  diazepam (VALIUM) 5 MG tablet Take 1 tablet (5 mg total) by mouth every 12 (twelve) hours as needed for anxiety. Patient not taking: Reported on 12/01/2018 11/05/16   Nelwyn SalisburyFry, Stephen A, MD  diclofenac sodium (VOLTAREN) 1 % GEL APPLY ONE APPLICATION FOUR TIMES DAILY AS NEEDED 11/05/18   [provider]  dicyclomine (BENTYL) 20 MG tablet TAKE ONE TABLET BY MOUTH 3 TIMES DAILY BEFORE MEALS Patient taking differently: Take 20 mg by mouth 4 (four) times daily -  before meals and at bedtime.  06/03/18   Meryl DareStark, Malcolm T, MD  dihydroergotamine (MIGRANAL) 4 MG/ML nasal spray Place into the nose.    [provider]  EMGALITY 120 MG/ML SOAJ Inject 120 mg into  the skin every 30 (thirty) days. 08/02/18   [provider]  fesoterodine (TOVIAZ) 4 MG TB24 tablet Take by mouth. 03/01/19   [provider]  fluticasone-salmeterol (ADVAIR HFA) 956-21115-21 MCG/ACT inhaler Inhale into the lungs. 03/16/19   [provider]  gabapentin (NEURONTIN) 300 MG capsule TAKE 2 CAPSULES BY MOUTH 3 TIMES DAILY Patient taking differently: Take 600-1,200 mg by mouth See admin instructions. Take 600mg  by mouth in the morning and 1,200mg  in the evening 05/26/17   Nelwyn SalisburyFry, Stephen A, MD  levocetirizine (XYZAL) 5 MG tablet Take 5 mg by mouth every evening. 11/23/18   [provider]  lidocaine (XYLOCAINE) 2 % solution Use as directed 15 mLs in the mouth or throat as needed for mouth pain.    [provider]  magic mouthwash SOLN Take 5 mLs by mouth 4 (four) times daily as needed for mouth pain.    [provider]  Magnesium 400 MG TABS Take 1,200 mg by mouth at bedtime.     [provider]  mirabegron ER (MYRBETRIQ) 25 MG TB24 tablet Take 25 mg by mouth at bedtime.    [provider]  montelukast (SINGULAIR) 10 MG tablet Take 10 mg by mouth at bedtime.     [provider]  naloxone [facility](NARCAN) nasal spray 4 mg/0.1 mL SMARTSIG:1 Spray(s) Both Nares Once 12/20/18   [provider]  Oxycodone HCl 10 MG TABS Take 10 mg by mouth 4 (four) times daily as needed. 01/13/19   [provider]  oxyCODONE-acetaminophen (PERCOCET/ROXICET) 5-325 MG tablet Take 1-2 tablets by mouth every 6 (six) hours as needed for severe pain. Patient not taking: Reported on 12/01/2018 08/28/18   Olivia Mackieaavon, Richard, MD  pantoprazole (PROTONIX) 40 MG tablet TAKE ONE TABLET BY MOUTH TWICE DAILY Patient taking differently: Take 40 mg by mouth 2 (two) times daily.  11/26/17   Sheliah Hatchabori, Katherine E, MD  phentermine (ADIPEX-P) 37.5 MG tablet Take 37.5 mg by mouth daily. 12/17/18   [provider]  Polyethylene Glycol 3350 (MIRALAX PO) Take  by mouth at bedtime.    [provider]  promethazine (PHENERGAN) 25 MG tablet Take 1 tablet (25 mg total) by mouth every 8 (eight) hours as needed for  nausea or vomiting. 08/14/17   Waldon Merl, PA-C  pseudoephedrine-codeine-guaifenesin [facility]) 30-10-100 MG/5ML solution Take 10 mLs by mouth 4 (four) times daily as needed for cough.    [provider]  tiZANidine (ZANAFLEX) 4 MG tablet Take 4 mg by mouth every 6 (six) hours as needed for muscle spasms.  11/18/16   [provider]  traMADol (ULTRAM) 50 MG tablet Take 1 tablet (50 mg total) by mouth every 6 (six) hours as needed for moderate pain. Patient not taking: Reported on 12/01/2018 08/28/18   Olivia Mackie, MD  venlafaxine XR (EFFEXOR-XR) 150 MG 24 hr capsule Take 2 capsules (300 mg total) by mouth daily before breakfast. 11/06/16   Nelwyn Salisbury, MD  zonisamide (ZONEGRAN) 25 MG capsule TAKE THREE CAPSULES BY MOUTH AT BEDTIME 03/16/19   [provider]    Allergies Nickel; Nsaids; Oxybutynin; Adhesive [tape]; Metoclopramide hcl; Prochlorperazine edisylate; and Sulfa antibiotics   REVIEW OF SYSTEMS  Negative except as noted here or in the History of Present Illness.   PHYSICAL EXAMINATION  Initial Vital Signs Blood pressure 108/77, pulse 62, temperature 97.6 F (36.4 C), temperature source Oral, resp. rate 14, height  (1.702 m), weight 95 kg, last menstrual period 08/26/2018, SpO2 98 %.  Examination General: Well-developed, well-nourished female in no acute distress; appearance consistent with age of record HENT: normocephalic; atraumatic Eyes: pupils equal, round and reactive to light but mydriatic; extraocular muscles intact Neck: supple Heart: regular rate and rhythm Lungs: clear to auscultation bilaterally Abdomen: soft; obese; diffusely tender bowel sounds present Extremities: No acute deformity; full range of motion; pulses normal Neurologic: Somnolent but arousable;  dysarthria; noted to move all extremities  Skin: Warm and dry Psychiatric: Normal mood and affect   RESULTS  Summary of this visit's results, reviewed by myself:   EKG Interpretation  Date/Time:    Ventricular Rate:    PR Interval:    QRS Duration:   QT Interval:    QTC Calculation:   R Axis:     Text Interpretation:        Laboratory Studies: Results for orders placed or performed during the hospital encounter of 04/15/19 (from the past 24 hour(s))  Ethanol     Status: None   Collection Time: 04/16/19 12:26 AM  Result Value Ref Range   Alcohol, Ethyl (B) <10 <10 mg/dL  CBC with Differential/Platelet     Status: Abnormal   Collection Time: 04/16/19 12:26 AM  Result Value Ref Range   WBC 5.4 4.0 - 10.5 K/uL   RBC 3.93 3.87 - 5.11 MIL/uL   Hemoglobin 11.7 (L) 12.0 - 15.0 g/dL   HCT 82.9 56.2 - 13.0 %   MCV 93.1 80.0 - 100.0 fL   MCH 29.8 26.0 - 34.0 pg   MCHC 32.0 30.0 - 36.0 g/dL   RDW 86.5 78.4 - 69.6 %   Platelets 219 150 - 400 K/uL   nRBC 0.0 0.0 - 0.2 %   Neutrophils Relative % 35 %   Neutro Abs 1.9 1.7 - 7.7 K/uL   Lymphocytes Relative 48 %   Lymphs Abs 2.7 0.7 - 4.0 K/uL   Monocytes Relative 9 %   Monocytes Absolute 0.5 0.1 - 1.0 K/uL   Eosinophils Relative 7 %   Eosinophils Absolute 0.4 0.0 - 0.5 K/uL   Basophils Relative 1 %   Basophils Absolute 0.0 0.0 - 0.1 K/uL   Immature Granulocytes 0 %   Abs Immature Granulocytes 0.01 0.00 - 0.07  K/uL  Comprehensive metabolic panel     Status: Abnormal   Collection Time: 04/16/19 12:26 AM  Result Value Ref Range   Sodium 138 135 - 145 mmol/L   Potassium 4.2 3.5 - 5.1 mmol/L   Chloride 104 98 - 111 mmol/L   CO2 26 22 - 32 mmol/L   Glucose, Bld 98 70 - 99 mg/dL   BUN 13 6 - 20 mg/dL   Creatinine, Ser 8.29 0.44 - 1.00 mg/dL   Calcium 8.9 8.9 - 56.2 mg/dL   Total Protein 6.9 6.5 - 8.1 g/dL   Albumin 3.7 3.5 - 5.0 g/dL   AST 22 15 - 41 U/L   ALT 17 0 - 44 U/L   Alkaline Phosphatase 68 38 - 126 U/L   Total  Bilirubin 0.1 (L) 0.3 - 1.2 mg/dL   GFR calc non Af Amer >60 >60 mL/min   GFR calc Af Amer >60 >60 mL/min   Anion gap 8 5 - 15  I-Stat Beta hCG blood, ED ([facility], [facility], [facility] only)     Status: None   Collection Time: 04/16/19 12:56 AM  Result Value Ref Range   I-stat hCG, quantitative <5.0 <5 mIU/mL   Comment 3          Rapid urine drug screen (hospital performed)     Status: Abnormal   Collection Time: 04/16/19  1:23 AM  Result Value Ref Range   Opiates POSITIVE (A) NONE DETECTED   Cocaine NONE DETECTED NONE DETECTED   Benzodiazepines POSITIVE (A) NONE DETECTED   Amphetamines POSITIVE (A) NONE DETECTED   Tetrahydrocannabinol NONE DETECTED NONE DETECTED   Barbiturates NONE DETECTED NONE DETECTED  Urinalysis, Routine w reflex microscopic     Status: None   Collection Time: 04/16/19  1:23 AM  Result Value Ref Range   Color, Urine YELLOW YELLOW   APPearance CLEAR CLEAR   Specific Gravity, Urine 1.015 1.005 - 1.030   pH 5.0 5.0 - 8.0   Glucose, UA NEGATIVE NEGATIVE mg/dL   Hgb urine dipstick NEGATIVE NEGATIVE   Bilirubin Urine NEGATIVE NEGATIVE   Ketones, ur NEGATIVE NEGATIVE mg/dL   Protein, ur NEGATIVE NEGATIVE mg/dL   Nitrite NEGATIVE NEGATIVE   Leukocytes,Ua NEGATIVE NEGATIVE   Imaging Studies: No results found.  ED COURSE and MDM  Nursing notes and initial vitals signs, including pulse oximetry, reviewed.  Vitals:   04/16/19 0500 04/16/19 0530 04/16/19 0600 04/16/19 0630  BP: 111/71 107/68 102/62 113/68  Pulse: 68 71 73 79  Resp: Temp:      TempSrc:      SpO2: 100% 100% 100% 100%  Weight:      Height:       4:12 AM Patient continues to be somnolent though arousable.  I suspect her altered mental status, difficulty ambulating and mydriasis are due to over indulgence of pharmaceuticals.  As noted above her persistent abdominal pain has been worked up with no significant abnormal findings.   7:03 AM Patient continues to be somnolent but arousable.  We will  continue to observe.  Dr. Judd Lien will follow-up and make disposition.  PROCEDURES    ED DIAGNOSES     ICD-10-CM   1. Drug overdose, undetermined intent, initial encounter T50.904A   2. Chronic pain disorder G89.4        Calbert Hulsebus, Jonny Ruiz, MD 04/16/19 [phone]

## 2019-04-16 NOTE — Discharge Instructions (Addendum)
Follow-up with your primary doctor and gastroenterologist this week as scheduled.  Continue your medications as previously prescribed.

## 2019-04-16 NOTE — ED Provider Notes (Signed)
Care assumed from Dr. Read Drivers at shift change.  Patient with history of chronic pain presenting with complaints of unsteadiness and reported low blood pressure.  Patient has had ongoing work-up into persistent nausea and vomiting and is due to have a colonoscopy/endoscopy this Tuesday.  Yesterday evening she seemed unsteady on her feet and bumping into things.  Patient laboratory studies thus far show a basically normal CBC, CMP, and urinalysis.  She does have opiates, benzodiazepines, and amphetamines in her urine drug screen.  It was felt at the time of signout that this was the cause of her unsteadiness.  Patient reassessed and is now awake, alert, and talking.  She was ambulated without difficulty.  At this point, I see no indication for admission and no indication for further work-up.  Patient will be discharged and is to keep her appointment for colonoscopy/endoscopy this coming Tuesday.   Geoffery Lyons, MD 04/16/19 [phone]

## 2019-04-16 NOTE — ED Notes (Signed)
She ambulates without difficulty and is in no distress. She is alert and oriented x 4.

## 2019-05-04 ENCOUNTER — Other Ambulatory Visit: Payer: Self-pay | Admitting: Obstetrics and Gynecology

## 2019-05-11 ENCOUNTER — Other Ambulatory Visit: Payer: Self-pay | Admitting: Obstetrics and Gynecology

## 2019-08-10 ENCOUNTER — Encounter (HOSPITAL_BASED_OUTPATIENT_CLINIC_OR_DEPARTMENT_OTHER): Payer: Self-pay | Admitting: *Deleted

## 2019-08-10 ENCOUNTER — Emergency Department (HOSPITAL_BASED_OUTPATIENT_CLINIC_OR_DEPARTMENT_OTHER)
Admission: EM | Admit: 2019-08-10 | Discharge: 2019-08-10 | Disposition: A | Discharge status: Home / Self Care | Payer: 59 | Attending: Emergency Medicine | Admitting: Emergency Medicine

## 2019-08-10 ENCOUNTER — Other Ambulatory Visit: Payer: Self-pay

## 2019-08-10 DIAGNOSIS — Z79899 Other long term (current) drug therapy: Secondary | ICD-10-CM | POA: Diagnosis not present

## 2019-08-10 DIAGNOSIS — S01112A Laceration without foreign body of left eyelid and periocular area, initial encounter: Secondary | ICD-10-CM | POA: Diagnosis not present

## 2019-08-10 DIAGNOSIS — W109XXA Fall (on) (from) unspecified stairs and steps, initial encounter: Secondary | ICD-10-CM | POA: Insufficient documentation

## 2019-08-10 DIAGNOSIS — Z23 Encounter for immunization: Secondary | ICD-10-CM | POA: Diagnosis not present

## 2019-08-10 DIAGNOSIS — Y999 Unspecified external cause status: Secondary | ICD-10-CM | POA: Diagnosis not present

## 2019-08-10 DIAGNOSIS — Y929 Unspecified place or not applicable: Secondary | ICD-10-CM | POA: Diagnosis not present

## 2019-08-10 DIAGNOSIS — Y939 Activity, unspecified: Secondary | ICD-10-CM | POA: Insufficient documentation

## 2019-08-10 DIAGNOSIS — R42 Dizziness and giddiness: Secondary | ICD-10-CM

## 2019-08-10 DIAGNOSIS — F17210 Nicotine dependence, cigarettes, uncomplicated: Secondary | ICD-10-CM | POA: Diagnosis not present

## 2019-08-10 DIAGNOSIS — S0990XA Unspecified injury of head, initial encounter: Secondary | ICD-10-CM | POA: Diagnosis not present

## 2019-08-10 LAB — COMPREHENSIVE METABOLIC PANEL
ALT: 23 U/L (ref 0–44)
AST: 31 U/L (ref 15–41)
Albumin: 3.8 g/dL (ref 3.5–5.0)
Alkaline Phosphatase: 73 U/L (ref 38–126)
Anion gap: 10 (ref 5–15)
BUN: 9 mg/dL (ref 6–20)
CO2: 26 mmol/L (ref 22–32)
Calcium: 9.2 mg/dL (ref 8.9–10.3)
Chloride: 102 mmol/L (ref 98–111)
Creatinine, Ser: 0.61 mg/dL (ref 0.44–1.00)
GFR calc Af Amer: >60 mL/min (ref >60)
GFR calc non Af Amer: >60 mL/min (ref >60)
Glucose, Bld: 97 mg/dL (ref 70–99)
Potassium: 3.5 mmol/L (ref 3.5–5.1)
Sodium: 138 mmol/L (ref 135–145)
Total Bilirubin: 0.2 mg/dL — ABNORMAL LOW (ref 0.3–1.2)
Total Protein: 7.1 g/dL (ref 6.5–8.1)

## 2019-08-10 LAB — URINALYSIS, ROUTINE W REFLEX MICROSCOPIC
Bilirubin Urine: NEGATIVE
Glucose, UA: NEGATIVE mg/dL
Hgb urine dipstick: NEGATIVE
Ketones, ur: NEGATIVE mg/dL
Leukocytes,Ua: NEGATIVE
Nitrite: NEGATIVE
Protein, ur: NEGATIVE mg/dL
Specific Gravity, Urine: 1.02 (ref 1.005–1.030)
pH: 6.5 (ref 5.0–8.0)

## 2019-08-10 LAB — PREGNANCY, URINE: Preg Test, Ur: NEGATIVE

## 2019-08-10 LAB — CBC WITH DIFFERENTIAL/PLATELET
Abs Immature Granulocytes: 0.02 10*3/uL (ref 0.00–0.07)
Basophils Absolute: 0.1 10*3/uL (ref 0.0–0.1)
Basophils Relative: 1 %
Eosinophils Absolute: 0.2 10*3/uL (ref 0.0–0.5)
Eosinophils Relative: 3 %
HCT: 38.2 % (ref 36.0–46.0)
Hemoglobin: 12 g/dL (ref 12.0–15.0)
Immature Granulocytes: 0 %
Lymphocytes Relative: 26 %
Lymphs Abs: 2.2 10*3/uL (ref 0.7–4.0)
MCH: 28.6 pg (ref 26.0–34.0)
MCHC: 31.4 g/dL (ref 30.0–36.0)
MCV: 91.2 fL (ref 80.0–100.0)
Monocytes Absolute: 0.5 10*3/uL (ref 0.1–1.0)
Monocytes Relative: 6 %
Neutro Abs: 5.7 10*3/uL (ref 1.7–7.7)
Neutrophils Relative %: 64 %
Platelets: 256 10*3/uL (ref 150–400)
RBC: 4.19 MIL/uL (ref 3.87–5.11)
RDW: 13 % (ref 11.5–15.5)
WBC: 8.7 10*3/uL (ref 4.0–10.5)
nRBC: 0 % (ref 0.0–0.2)

## 2019-08-10 MED ORDER — ACETAMINOPHEN 500 MG PO TABS
1000.0000 mg | ORAL_TABLET | Freq: Once | ORAL | Status: DC
  Filled 2019-08-10: qty 2

## 2019-08-10 MED ORDER — ONDANSETRON 4 MG PO TBDP
4.0000 mg | ORAL_TABLET | Freq: Once | ORAL | Status: AC
  Administered 2019-08-10: 4 mg via ORAL
  Filled 2019-08-10: qty 1

## 2019-08-10 MED ORDER — TETANUS-DIPHTH-ACELL PERTUSSIS 5-2.5-18.5 LF-MCG/0.5 IM SUSP
0.5000 mL | Freq: Once | INTRAMUSCULAR | Status: AC
  Administered 2019-08-10: 0.5 mL via INTRAMUSCULAR
  Filled 2019-08-10: qty 0.5

## 2019-08-10 NOTE — ED Provider Notes (Signed)
   ED Course/Procedures     .Marland KitchenLaceration Repair  Date/Time: 08/10/2019 4:57 PM Performed by: Franchot Heidelberg, PA-C Authorized by: Franchot Heidelberg, PA-C   Consent:    Consent obtained:  Verbal   Consent given by:  Patient   Risks discussed:  Infection, nerve damage, need for additional repair, pain, poor cosmetic result and poor wound healing Laceration details:    Location:  Face   Face location:  L eyebrow   Length (cm):  2   Depth (mm):  1 Repair type:    Repair type:  Simple Treatment:    Area cleansed with:  Soap and water Skin repair:    Repair method:  Tissue adhesive Approximation:    Approximation:  Close Post-procedure details:    Dressing:  Open (no dressing)   Patient tolerance of procedure:  Tolerated well, no immediate complications            Franchot Heidelberg, PA-C 08/10/19 1658    Julianne Rice, MD 08/10/19 2008

## 2019-08-10 NOTE — ED Triage Notes (Signed)
Pt tripped and fell down steps, hit left eyebrow on baby gate.  Laceration to left eyebrow.  No LOC.  No dizziness, etc.  Pt is being seen for swelling in legs.  Pt on lasix for one week.

## 2019-08-10 NOTE — ED Provider Notes (Signed)
[facility] EMERGENCY DEPARTMENT Provider Note   CSN: [id number] Arrival date & time: 08/10/19  1444     History   Chief Complaint Chief Complaint  Patient presents with  . Laceration    HPI Diane [name] is a 38 y.o. female.     HPI Patient states that while coming down the stairs her right ankle "gave way".  She fell down several stairs and struck her head on a metal child's gait.  Denies loss of consciousness.  Sustained laceration to the left eyebrow.  Denies any eye pain or visual changes.  Patient does have generalized headache since the fall which occurred roughly 1 hour before arrival.  Patient has chronic low back pain which she states is unchanged.  Patient does state that she has been on Lasix for lower extremity swelling for the past 5 days.  She is been experiencing lightheadedness especially when she stands up.  She also complains of mild nausea.  Denies chest pain, shortness of breath or abdominal pain.  No focal weakness or numbness.  Patient is on a blood thinner.  Unknown last tetanus. Past Medical History:  Diagnosis Date  . ADD (attention deficit disorder with hyperactivity)   . Anemia    takes iron supplement  . Anxiety   . Choking    due to food and pills get stuck  . Depression   . Dysmenorrhea   . Family history of adverse reaction to anesthesia    pt's father has hx. of being hard to wake up post-op  . GERD (gastroesophageal reflux disease)   . History of gastric ulcer   . History of kidney stones   . IBS (irritable bowel syndrome)   . Migraines    neurologist-  dr c. Sharene Skeans ([facility] in [place])-- treated with Emgality injection every 30 days  . Mild obstructive sleep apnea    per study in epic 03-21-2017 mild osa, recommendation cpap, mouth appliance, wt loss  . Osteoarthritis    right ankle  . Osteochondral lesion 09/2016   right ankle  . Osteomyelitis of knee region [facility])   . Peripheral vascular disease (HCC)     NERVE DAMAGE RIGHT LEG AND FOOT DUE TO INJURGY.   Marland Kitchen Plantar fasciitis of left foot   . PONV (postoperative nausea and vomiting)    AND HEADACHES  . Pseudotumor cerebri    at back head  . S/P dilatation of esophageal stricture 07/2018  . Sinusitis 08/12/2018   RESOLVED WITH ANTIOBIOTIC  . SUI (stress urinary incontinence, female)   . Wears contact lenses   . Wears partial dentures    upper    Patient Active Problem List   Diagnosis Date Noted  . Dysmenorrhea 08/27/2018  . OAB (overactive bladder) 08/12/2017  . Dysphagia 08/12/2017  . Dry mouth 08/12/2017  . Osteochondral lesion 06/03/2017  . OSA (obstructive sleep apnea) 03/24/2017  . Nausea and vomiting 12/03/2016  . RUQ pain   . Cholelithiasis and acute cholecystitis without obstruction 12/02/2016  . Ankle fracture 01/05/2016  . Chronic pain of right knee 04/12/2015  . Gastric ulcer 03/16/2015  . Chronic cough 12/27/2013  . Pleural effusion 12/15/2013  . Chronic migraine without aura 04/01/2013  . DEPRESSION, ACUTE 10/02/2008  . Narcotic dependence (HCC) 10/02/2008  . BENIGN INTRACRANIAL HYPERTENSION 06/16/2008  . BACK PAIN 03/28/2008  . WEIGHT GAIN 01/25/2008  . Anxiety state 11/09/2007  . HEADACHE 07/09/2007    Past Surgical History:  Procedure Laterality Date  . ANKLE  ARTHROSCOPY Right 10/22/2016   Procedure: ANKLE ARTHROSCOPY;  Surgeon: Vivi Barrack, DPM;  Location: [facility];  Service: Podiatry;  Laterality: Right;  GENERAL/REG BLOCK  . ANKLE ARTHROSCOPY Right 09-15-2017   @[facility]   . BLADDER SURGERY  child   x 2 - as a child to stretch bladder  . CESAREAN SECTION  12/24/2009  . CESAREAN SECTION  11/20/2011   Procedure: CESAREAN SECTION;  Surgeon: Lenoard Aden, MD;  Location: [facility];  Service: Gynecology;  Laterality: N/A;  . CHOLECYSTECTOMY N/A 12/03/2016   Procedure: LAPAROSCOPIC CHOLECYSTECTOMY WITH INTRAOPERATIVE CHOLANGIOGRAM;  Surgeon: Darnell Level, MD;  Location: [facility];  Service:  General;  Laterality: N/A;  . CYSTOSCOPY WITH RETROGRADE PYELOGRAM, URETEROSCOPY AND STENT PLACEMENT Left 08/10/2006  . ESOPHAGOGASTRODUODENOSCOPY (EGD) WITH ESOPHAGEAL DILATION  07/2018  . HARDWARE REMOVAL Right 07/01/2016   Procedure: HARDWARE REMOVAL;  Surgeon: Cammy Copa, MD;  Location[facility];  Service: Orthopedics;  Laterality: Right;  . KNEE ARTHROSCOPY WITH LATERAL RELEASE Right 12/13/2014   Procedure: KNEE ARTHROSCOPY WITH LATERAL RELEASE;  Surgeon: Thera Flake., MD;  Location: [facility];  Service: Orthopedics;  Laterality: Right;  . LAPAROSCOPIC APPENDECTOMY N/A 06/11/2014   Procedure: APPENDECTOMY LAPAROSCOPIC;  Surgeon: Velora Heckler, MD;  Location: [facility];  Service: General;  Laterality: N/A;  . ORIF ANKLE FRACTURE Right 01/05/2016   Procedure: OPEN REDUCTION INTERNAL FIXATION (ORIF) ANKLE FRACTURE;  Surgeon: Cammy Copa, MD;  Location: [facility];  Service: Orthopedics;  Laterality: Right;  . OVARIAN CYST SURGERY Right 03/05/2007   AND CHROMOPERTUBATION  VIA LAPAROSCOPY  . ROBOTIC ASSISTED LAPAROSCOPIC HYSTERECTOMY AND SALPINGECTOMY Bilateral 08/27/2018   Procedure: XI ROBOTIC ASSISTED LAPAROSCOPIC HYSTERECTOMY AND BILATERAL SALPINGECTOMY;  Surgeon: Olivia Mackie, MD;  Location: [facility];  Service: Gynecology;  Laterality: Bilateral;  [facility] for 23hr OBS  . SYNOVECTOMY Right 12/13/2014   Procedure: PLICA SYNOVECTOMY;  Surgeon: Thera Flake., MD;  Location: [facility];  Service: Orthopedics;  Laterality: Right;  . TONSILLECTOMY AND ADENOIDECTOMY  child  . WISDOM TOOTH EXTRACTION       OB History    Gravida  2   Para  2   Term  1   Preterm  1   AB  0   Living  2     SAB  0   TAB  0   Ectopic  0   Multiple  0   Live Births  2            Home Medications    Prior to Admission medications   Medication Sig Start Date End Date Taking? Authorizing Provider  ADDERALL XR 20 MG 24 hr capsule Take 20 mg by mouth every  morning. 07/09/18  Yes [provider]  albuterol (PROVENTIL HFA;VENTOLIN HFA) 108 (90 Base) MCG/ACT inhaler Inhale 2 puffs into the lungs every 6 (six) hours as needed for wheezing or shortness of breath.    Yes [provider]  dicyclomine (BENTYL) 20 MG tablet TAKE ONE TABLET BY MOUTH 3 TIMES DAILY BEFORE MEALS Patient taking differently: Take 20 mg by mouth 4 (four) times daily -  before meals and at bedtime.  06/03/18  Yes Meryl Dare, MD  gabapentin (NEURONTIN) 300 MG capsule TAKE 2 CAPSULES BY MOUTH 3 TIMES DAILY Patient taking differently: Take 600-1,200 mg by mouth See admin instructions. Take 600mg  by mouth in the morning and 1,200mg  in the evening 05/26/17  Yes Nelwyn Salisbury, MD  levocetirizine (XYZAL) 5 MG tablet Take 5 mg by mouth every evening. 11/23/18  Yes [provider]  Magnesium 400 MG TABS Take 1,200 mg by mouth at bedtime.    Yes [provider]  oxyCODONE-acetaminophen (PERCOCET/ROXICET) 5-325 MG tablet Take 1-2 tablets by mouth every 6 (six) hours as needed for severe pain. 08/28/18  Yes Olivia Mackieaavon, Richard, MD  pantoprazole (PROTONIX) 40 MG tablet TAKE ONE TABLET BY MOUTH TWICE DAILY Patient taking differently: Take 40 mg by mouth 2 (two) times daily.  11/26/17  Yes Sheliah Hatchabori, Katherine E, MD  Polyethylene Glycol 3350 (MIRALAX PO) Take by mouth at bedtime.   Yes [provider]  venlafaxine XR (EFFEXOR-XR) 150 MG 24 hr capsule Take 2 capsules (300 mg total) by mouth daily before breakfast. 11/06/16  Yes Nelwyn SalisburyFry, Stephen A, MD  zonisamide (ZONEGRAN) 25 MG capsule TAKE THREE CAPSULES BY MOUTH AT BEDTIME 03/16/19  Yes [provider]  chlorpheniramine-HYDROcodone (TUSSIONEX) 10-8 MG/5ML SUER Take by mouth. 03/16/19   [provider]  diazepam (VALIUM) 5 MG tablet Take 1 tablet (5 mg total) by mouth every 12 (twelve) hours as needed for anxiety. Patient not taking: Reported on 12/01/2018 11/05/16   Nelwyn SalisburyFry, Stephen A, MD  diclofenac  sodium (VOLTAREN) 1 % GEL APPLY ONE APPLICATION FOUR TIMES DAILY AS NEEDED 11/05/18   [provider]  EMGALITY 120 MG/ML SOAJ Inject 120 mg into the skin every 30 (thirty) days. 08/02/18   [provider]  lidocaine (XYLOCAINE) 2 % solution Use as directed 15 mLs in the mouth or throat as needed for mouth pain.    [provider]  magic mouthwash SOLN Take 5 mLs by mouth 4 (four) times daily as needed for mouth pain.    [provider]  promethazine (PHENERGAN) 25 MG tablet Take 1 tablet (25 mg total) by mouth every 8 (eight) hours as needed for nausea or vomiting. 08/14/17   Waldon MerlMartin, William C, PA-C  tiZANidine (ZANAFLEX) 4 MG tablet Take 4 mg by mouth every 6 (six) hours as needed for muscle spasms.  11/18/16   [provider]    Family History Family History  Problem Relation Age of Onset  . Brain cancer Father   . Anesthesia problems Father        hard to wake up post-op  . Lung cancer Father   . Breast cancer Maternal Grandmother   . Ovarian cancer Maternal Grandmother   . Diabetes Maternal Grandmother   . Heart disease Maternal Grandmother   . Irritable bowel syndrome Mother   . Prostate cancer Maternal Grandfather   . Melanoma Maternal Grandfather   . Colon cancer Neg Hx     Social History Social History   Tobacco Use  . Smoking status: Current Some Day Smoker    Packs/day: 0.50    Years: 15.00    Pack years: 7.50    Types: Cigarettes  . Smokeless tobacco: Never Used  Substance Use Topics  . Alcohol use: Yes    Alcohol/week: 0.0 standard drinks    Comment: occasional  . Drug use: No     Allergies   Nickel, Nsaids, Oxybutynin, Adhesive [tape], Metoclopramide hcl, Prochlorperazine edisylate, and Sulfa antibiotics   Review of Systems Review of Systems  Constitutional: Negative for chills and fever.  HENT: Negative for sore throat and trouble swallowing.   Eyes: Negative for pain and visual disturbance.  Respiratory:  Negative for cough and shortness of breath.   Cardiovascular: Negative for chest pain.  Gastrointestinal:  Positive for nausea. Negative for abdominal pain, constipation, diarrhea and vomiting.  Genitourinary: Negative for dysuria and flank pain.  Musculoskeletal: Positive for back pain. Negative for myalgias and neck pain.  Skin: Positive for wound. Negative for rash.  Neurological: Positive for light-headedness and headaches. Negative for syncope, weakness and numbness.  All other systems reviewed and are negative.    Physical Exam Updated Vital Signs BP 121/82 (BP Location: Right Arm)   Pulse 75   Temp 98.4 F (36.9 C) (Oral)   Resp 19   Ht 5\' 7"  (1.702 m)   Wt 90.7 kg   LMP 08/26/2018   SpO2 99%   BMI 31.32 kg/m   Physical Exam Vitals signs and nursing note reviewed.  Constitutional:      Appearance: Normal appearance. She is well-developed.  HENT:     Head: Normocephalic.     Comments: Patient with linear superficial laceration roughly 3 cm in length to the left eyebrow.  No active bleeding.  No gross contamination.  Laceration is well aligned.  Midface is stable.  No hemotympanum.  No malocclusion.    Nose: Nose normal.     Mouth/Throat:     Mouth: Mucous membranes are moist.  Eyes:     Extraocular Movements: Extraocular movements intact.     Pupils: Pupils are equal, round, and reactive to light.  Neck:     Musculoskeletal: Normal range of motion and neck supple. No neck rigidity or muscular tenderness.     Comments: No posterior midline cervical tenderness to palpation. Cardiovascular:     Rate and Rhythm: Normal rate and regular rhythm.     Heart sounds: No murmur. No friction rub. No gallop.   Pulmonary:     Effort: Pulmonary effort is normal. No respiratory distress.     Breath sounds: Normal breath sounds. No stridor. No wheezing, rhonchi or rales.  Chest:     Chest wall: No tenderness.  Abdominal:     General: Bowel sounds are normal.     Palpations:  Abdomen is soft.     Tenderness: There is no abdominal tenderness. There is no guarding or rebound.  Musculoskeletal: Normal range of motion.        General: Tenderness present. No swelling or deformity.     Comments: Mild midline inferior lumbar tenderness to palpation.  No step-offs or deformities.  Distal pulses are 2+.  Pelvis is stable.  Lymphadenopathy:     Cervical: No cervical adenopathy.  Skin:    General: Skin is warm and dry.     Findings: No erythema or rash.  Neurological:     General: No focal deficit present.     Mental Status: She is alert and oriented to person, place, and time.     Comments: Patient is alert and oriented x3 with clear, goal oriented speech. Patient has 5/5 motor in all extremities. Sensation is intact to light touch.   Psychiatric:        Behavior: Behavior normal.      ED Treatments / Results  Labs (all labs ordered are listed, but only abnormal results are displayed) Labs Reviewed  COMPREHENSIVE METABOLIC PANEL - Abnormal; Notable for the following components:      Result Value   Total Bilirubin 0.2 (*)    All other components within normal limits  URINALYSIS, ROUTINE W REFLEX MICROSCOPIC - Abnormal; Notable for the following components:   APPearance CLOUDY (*)    All other components within normal limits  CBC WITH DIFFERENTIAL/PLATELET  PREGNANCY, URINE    EKG EKG Interpretation  Date/Time:  Wednesday August 10 2019 15:49:39 EDT Ventricular Rate:  72 PR Interval:    QRS Duration: 94 QT Interval:  429 QTC Calculation: 470 R Axis:   48 Text Interpretation:  Sinus rhythm Abnormal R-wave progression, early transition Borderline T abnormalities, anterior leads Confirmed by Julianne Rice [phone]) on 08/10/2019 8:07:11 PM   Radiology No results found.  Procedures Procedures (including critical care time)  Medications Ordered in ED Medications  acetaminophen (TYLENOL) tablet 1,000 mg (1,000 mg Oral Not Given 08/10/19 1602)   Tdap (BOOSTRIX) injection 0.5 mL (0.5 mLs Intramuscular Given 08/10/19 1605)  ondansetron (ZOFRAN-ODT) disintegrating tablet 4 mg (4 mg Oral Given 08/10/19 1605)     Initial Impression / Assessment and Plan / ED Course  I have reviewed the triage vital signs and the nursing notes.  Pertinent labs & imaging results that were available during my care of the patient were reviewed by me and considered in my medical decision making (see chart for details).        Laceration repaired.  Please see PA note.  Normal neurologic exam.  Laboratory work-up is normal.  No orthostasis on exam.  Tetanus is been updated.  Head injury precautions given.  Final Clinical Impressions(s) / ED Diagnoses   Final diagnoses:  Closed head injury, initial encounter  Eyebrow laceration, left, initial encounter  Dizziness    ED Discharge Orders    None       Julianne Rice, MD 08/10/19 2007

## 2019-08-10 NOTE — ED Notes (Signed)
ED Provider at bedside. 

## 2020-07-13 ENCOUNTER — Encounter (HOSPITAL_COMMUNITY): Payer: Self-pay

## 2020-07-13 ENCOUNTER — Emergency Department (HOSPITAL_COMMUNITY): Payer: Managed Care, Other (non HMO)

## 2020-07-13 ENCOUNTER — Emergency Department (HOSPITAL_COMMUNITY)
Admission: EM | Admit: 2020-07-13 | Discharge: 2020-07-14 | Disposition: A | Discharge status: Home / Self Care | Payer: Managed Care, Other (non HMO) | Attending: Emergency Medicine | Admitting: Emergency Medicine

## 2020-07-13 ENCOUNTER — Other Ambulatory Visit: Payer: Self-pay

## 2020-07-13 DIAGNOSIS — R05 Cough: Secondary | ICD-10-CM | POA: Diagnosis not present

## 2020-07-13 DIAGNOSIS — K529 Noninfective gastroenteritis and colitis, unspecified: Secondary | ICD-10-CM | POA: Diagnosis not present

## 2020-07-13 DIAGNOSIS — R42 Dizziness and giddiness: Secondary | ICD-10-CM | POA: Diagnosis not present

## 2020-07-13 DIAGNOSIS — Z20822 Contact with and (suspected) exposure to covid-19: Secondary | ICD-10-CM | POA: Insufficient documentation

## 2020-07-13 DIAGNOSIS — I10 Essential (primary) hypertension: Secondary | ICD-10-CM | POA: Diagnosis not present

## 2020-07-13 DIAGNOSIS — R0602 Shortness of breath: Secondary | ICD-10-CM | POA: Insufficient documentation

## 2020-07-13 DIAGNOSIS — A084 Viral intestinal infection, unspecified: Secondary | ICD-10-CM

## 2020-07-13 DIAGNOSIS — Z79899 Other long term (current) drug therapy: Secondary | ICD-10-CM | POA: Insufficient documentation

## 2020-07-13 DIAGNOSIS — R1110 Vomiting, unspecified: Secondary | ICD-10-CM | POA: Diagnosis present

## 2020-07-13 DIAGNOSIS — R001 Bradycardia, unspecified: Secondary | ICD-10-CM | POA: Insufficient documentation

## 2020-07-13 LAB — COMPREHENSIVE METABOLIC PANEL
ALT: 16 U/L (ref 0–44)
AST: 26 U/L (ref 15–41)
Albumin: 4 g/dL (ref 3.5–5.0)
Alkaline Phosphatase: 74 U/L (ref 38–126)
Anion gap: 10 (ref 5–15)
BUN: 12 mg/dL (ref 6–20)
CO2: 19 mmol/L — ABNORMAL LOW (ref 22–32)
Calcium: 9.1 mg/dL (ref 8.9–10.3)
Chloride: 107 mmol/L (ref 98–111)
Creatinine, Ser: 0.83 mg/dL (ref 0.44–1.00)
GFR calc Af Amer: >60 mL/min (ref >60)
GFR calc non Af Amer: >60 mL/min (ref >60)
Glucose, Bld: 84 mg/dL (ref 70–99)
Potassium: 4.4 mmol/L (ref 3.5–5.1)
Sodium: 136 mmol/L (ref 135–145)
Total Bilirubin: 0.6 mg/dL (ref 0.3–1.2)
Total Protein: 7.2 g/dL (ref 6.5–8.1)

## 2020-07-13 LAB — CBC WITH DIFFERENTIAL/PLATELET
Abs Immature Granulocytes: 0.02 10*3/uL (ref 0.00–0.07)
Basophils Absolute: 0 10*3/uL (ref 0.0–0.1)
Basophils Relative: 0 %
Eosinophils Absolute: 0.4 10*3/uL (ref 0.0–0.5)
Eosinophils Relative: 4 %
HCT: 39.2 % (ref 36.0–46.0)
Hemoglobin: 12.7 g/dL (ref 12.0–15.0)
Immature Granulocytes: 0 %
Lymphocytes Relative: 46 %
Lymphs Abs: 4.2 10*3/uL — ABNORMAL HIGH (ref 0.7–4.0)
MCH: 30.1 pg (ref 26.0–34.0)
MCHC: 32.4 g/dL (ref 30.0–36.0)
MCV: 92.9 fL (ref 80.0–100.0)
Monocytes Absolute: 0.6 10*3/uL (ref 0.1–1.0)
Monocytes Relative: 7 %
Neutro Abs: 3.9 10*3/uL (ref 1.7–7.7)
Neutrophils Relative %: 43 %
Platelets: 223 10*3/uL (ref 150–400)
RBC: 4.22 MIL/uL (ref 3.87–5.11)
RDW: 12.9 % (ref 11.5–15.5)
WBC: 9.2 10*3/uL (ref 4.0–10.5)
nRBC: 0 % (ref 0.0–0.2)

## 2020-07-13 LAB — LIPASE, BLOOD: Lipase: 39 U/L (ref 11–51)

## 2020-07-13 LAB — SARS CORONAVIRUS 2 BY RT PCR (HOSPITAL ORDER, PERFORMED IN ~~LOC~~ HOSPITAL LAB): SARS Coronavirus 2: NEGATIVE

## 2020-07-13 LAB — TROPONIN I (HIGH SENSITIVITY): Troponin I (High Sensitivity): <2 ng/L (ref <18)

## 2020-07-13 MED ORDER — HYDROMORPHONE HCL 1 MG/ML IJ SOLN
1.0000 mg | Freq: Once | INTRAMUSCULAR | Status: AC
  Administered 2020-07-13: 1 mg via INTRAVENOUS
  Filled 2020-07-13: qty 1

## 2020-07-13 MED ORDER — SODIUM CHLORIDE 0.9 % IV BOLUS
1000.0000 mL | Freq: Once | INTRAVENOUS | Status: AC
  Administered 2020-07-13: 1000 mL via INTRAVENOUS

## 2020-07-13 MED ORDER — IOHEXOL 300 MG/ML  SOLN
100.0000 mL | Freq: Once | INTRAMUSCULAR | Status: AC | PRN
  Administered 2020-07-13: 100 mL via INTRAVENOUS

## 2020-07-13 MED ORDER — ONDANSETRON HCL 4 MG/2ML IJ SOLN
4.0000 mg | Freq: Once | INTRAMUSCULAR | Status: AC
  Administered 2020-07-14: 4 mg via INTRAVENOUS
  Filled 2020-07-13: qty 2

## 2020-07-13 MED ORDER — ONDANSETRON 4 MG PO TBDP: 4.0000 mg | ORAL_TABLET | Freq: Three times a day (TID) | ORAL | 0 refills | Status: DC | PRN

## 2020-07-13 MED ORDER — ONDANSETRON HCL 4 MG/2ML IJ SOLN
4.0000 mg | Freq: Once | INTRAMUSCULAR | Status: AC
  Administered 2020-07-13: 4 mg via INTRAVENOUS
  Filled 2020-07-13: qty 2

## 2020-07-13 MED ORDER — FENTANYL CITRATE (PF) 100 MCG/2ML IJ SOLN
50.0000 ug | Freq: Once | INTRAMUSCULAR | Status: AC
  Administered 2020-07-14: 50 ug via INTRAVENOUS
  Filled 2020-07-13: qty 2

## 2020-07-13 NOTE — ED Provider Notes (Signed)
[facility]-EMERGENCY DEPT Provider Note   CSN: [id number] Arrival date & time: 07/13/20  1407     History Chief Complaint  Patient presents with   Shortness of Breath   Chest Pain   Diarrhea    Diane [name] is a 39 y.o. female.  HPI 39 year old female presents with vomiting and diarrhea.  Ongoing for about 4 days.  She has not been able to keep hardly anything down.  Has had subjective fever and chills.  Some cough, shortness of breath and some chest pain starting yesterday.  Has not received the Covid vaccination series.  Her husband has been in contact with Covid positive coworkers. Feels weak and dehydrated.   Past Medical History:  Diagnosis Date   ADD (attention deficit disorder with hyperactivity)    Anemia    takes iron supplement   Anxiety    Choking    due to food and pills get stuck   Depression    Dysmenorrhea    Family history of adverse reaction to anesthesia    pt's father has hx. of being hard to wake up post-op   GERD (gastroesophageal reflux disease)    History of gastric ulcer    History of kidney stones    IBS (irritable bowel syndrome)    Migraines    neurologist-  dr c. Sharene Skeans ([facility] in [place])-- treated with Emgality injection every 30 days   Mild obstructive sleep apnea    per study in epic 03-21-2017 mild osa, recommendation cpap, mouth appliance, wt loss   Osteoarthritis    right ankle   Osteochondral lesion 09/2016   right ankle   Osteomyelitis of knee region [facility])    Peripheral vascular disease (HCC)    NERVE DAMAGE RIGHT LEG AND FOOT DUE TO INJURGY.    Plantar fasciitis of left foot    PONV (postoperative nausea and vomiting)    AND HEADACHES   Pseudotumor cerebri    at back head   S/P dilatation of esophageal stricture 07/2018   Sinusitis 08/12/2018   RESOLVED WITH ANTIOBIOTIC   SUI (stress urinary incontinence, female)    Wears contact lenses    Wears  partial dentures    upper    Patient Active Problem List   Diagnosis Date Noted   Dysmenorrhea 08/27/2018   OAB (overactive bladder) 08/12/2017   Dysphagia 08/12/2017   Dry mouth 08/12/2017   Osteochondral lesion 06/03/2017   OSA (obstructive sleep apnea) 03/24/2017   Nausea and vomiting 12/03/2016   RUQ pain    Cholelithiasis and acute cholecystitis without obstruction 12/02/2016   Ankle fracture 01/05/2016   Chronic pain of right knee 04/12/2015   Gastric ulcer 03/16/2015   Chronic cough 12/27/2013   Pleural effusion 12/15/2013   Chronic migraine without aura 04/01/2013   DEPRESSION, ACUTE 10/02/2008   Narcotic dependence (HCC) 10/02/2008   BENIGN INTRACRANIAL HYPERTENSION 06/16/2008   BACK PAIN 03/28/2008   WEIGHT GAIN 01/25/2008   Anxiety state 11/09/2007   HEADACHE 07/09/2007    Past Surgical History:  Procedure Laterality Date   ANKLE ARTHROSCOPY Right 10/22/2016   Procedure: ANKLE ARTHROSCOPY;  Surgeon: Vivi Barrack, DPM;  Location: [facility];  Service: Podiatry;  Laterality: Right;  GENERAL/REG BLOCK   ANKLE ARTHROSCOPY Right 09-15-2017      BLADDER SURGERY  child   x 2 - as a child to stretch bladder   CESAREAN SECTION  12/24/2009   CESAREAN SECTION  11/20/2011   Procedure: CESAREAN  SECTION;  Surgeon: Lenoard Aden, MD;  Location: [facility];  Service: Gynecology;  Laterality: N/A;   CHOLECYSTECTOMY N/A 12/03/2016   Procedure: LAPAROSCOPIC CHOLECYSTECTOMY WITH INTRAOPERATIVE CHOLANGIOGRAM;  Surgeon: Darnell Level, MD;  Location: [facility];  Service: General;  Laterality: N/A;   CYSTOSCOPY WITH RETROGRADE PYELOGRAM, URETEROSCOPY AND STENT PLACEMENT Left 08/10/2006   ESOPHAGOGASTRODUODENOSCOPY (EGD) WITH ESOPHAGEAL DILATION  07/2018   HARDWARE REMOVAL Right 07/01/2016   Procedure: HARDWARE REMOVAL;  Surgeon: Cammy Copa, MD;  Location: [facility];  Service: Orthopedics;  Laterality: Right;   KNEE ARTHROSCOPY WITH  LATERAL RELEASE Right 12/13/2014   Procedure: KNEE ARTHROSCOPY WITH LATERAL RELEASE;  Surgeon: Thera Flake., MD;  Location: [facility];  Service: Orthopedics;  Laterality: Right;   LAPAROSCOPIC APPENDECTOMY N/A 06/11/2014   Procedure: APPENDECTOMY LAPAROSCOPIC;  Surgeon: Velora Heckler, MD;  Location: [facility];  Service: General;  Laterality: N/A;   ORIF ANKLE FRACTURE Right 01/05/2016   Procedure: OPEN REDUCTION INTERNAL FIXATION (ORIF) ANKLE FRACTURE;  Surgeon: Cammy Copa, MD;  Location: [facility];  Service: Orthopedics;  Laterality: Right;   OVARIAN CYST SURGERY Right 03/05/2007   AND CHROMOPERTUBATION  VIA LAPAROSCOPY   ROBOTIC ASSISTED LAPAROSCOPIC HYSTERECTOMY AND SALPINGECTOMY Bilateral 08/27/2018   Procedure: XI ROBOTIC ASSISTED LAPAROSCOPIC HYSTERECTOMY AND BILATERAL SALPINGECTOMY;  Surgeon: Olivia Mackie, MD;  Location: [facility];  Service: Gynecology;  Laterality: Bilateral;  [facility] for 23hr OBS   SYNOVECTOMY Right 12/13/2014   Procedure: PLICA SYNOVECTOMY;  Surgeon: Thera Flake., MD;  Location: [facility];  Service: Orthopedics;  Laterality: Right;   TONSILLECTOMY AND ADENOIDECTOMY  child   WISDOM TOOTH EXTRACTION       OB History    Gravida  2   Para  2   Term  1   Preterm  1   AB  0   Living  2     SAB  0   TAB  0   Ectopic  0   Multiple  0   Live Births  2           Family History  Problem Relation Age of Onset   Brain cancer Father    Anesthesia problems Father        hard to wake up post-op   Lung cancer Father    Breast cancer Maternal Grandmother    Ovarian cancer Maternal Grandmother    Diabetes Maternal Grandmother    Heart disease Maternal Grandmother    Irritable bowel syndrome Mother    Prostate cancer Maternal Grandfather    Melanoma Maternal Grandfather    Colon cancer Neg Hx     Social History   Tobacco Use   Smoking status: Current Some Day Smoker    Packs/day: 0.50    Years:  15.00    Pack years: 7.50    Types: Cigarettes   Smokeless tobacco: Never Used  Vaping Use   Vaping Use: Former  Substance Use Topics   Alcohol use: Yes    Alcohol/week: 0.0 standard drinks    Comment: occasional   Drug use: No    Home Medications Prior to Admission medications   Medication Sig Start Date End Date Taking? Authorizing Provider  ADDERALL XR 20 MG 24 hr capsule Take 20 mg by mouth every morning. 07/09/18   [provider]  albuterol (PROVENTIL HFA;VENTOLIN HFA) 108 (90 Base) MCG/ACT inhaler Inhale 2 puffs into the lungs every 6 (six) hours as needed for wheezing or shortness  of breath.     [provider]  chlorpheniramine-HYDROcodone (TUSSIONEX) 10-8 MG/5ML SUER Take by mouth. 03/16/19   [provider]  diazepam (VALIUM) 5 MG tablet Take 1 tablet (5 mg total) by mouth every 12 (twelve) hours as needed for anxiety. Patient not taking: Reported on 12/01/2018 11/05/16   Nelwyn Salisbury, MD  diclofenac sodium (VOLTAREN) 1 % GEL APPLY ONE APPLICATION FOUR TIMES DAILY AS NEEDED 11/05/18   [provider]  dicyclomine (BENTYL) 20 MG tablet TAKE ONE TABLET BY MOUTH 3 TIMES DAILY BEFORE MEALS Patient taking differently: Take 20 mg by mouth 4 (four) times daily -  before meals and at bedtime.  06/03/18   Meryl Dare, MD  EMGALITY 120 MG/ML SOAJ Inject 120 mg into the skin every 30 (thirty) days. 08/02/18   [provider]  gabapentin (NEURONTIN) 300 MG capsule TAKE 2 CAPSULES BY MOUTH 3 TIMES DAILY Patient taking differently: Take 600-1,200 mg by mouth See admin instructions. Take 600mg  by mouth in the morning and 1,200mg  in the evening 05/26/17   07/27/17, MD  levocetirizine (XYZAL) 5 MG tablet Take 5 mg by mouth every evening. 11/23/18   [provider]  lidocaine (XYLOCAINE) 2 % solution Use as directed 15 mLs in the mouth or throat as needed for mouth pain.    [provider]  magic mouthwash SOLN Take 5 mLs  by mouth 4 (four) times daily as needed for mouth pain.    [provider]  Magnesium 400 MG TABS Take 1,200 mg by mouth at bedtime.     [provider]  ondansetron (ZOFRAN ODT) 4 MG disintegrating tablet Take 1 tablet (4 mg total) by mouth every 8 (eight) hours as needed for nausea or vomiting. 07/13/20   07/15/20, MD  oxyCODONE-acetaminophen (PERCOCET/ROXICET) 5-325 MG tablet Take 1-2 tablets by mouth every 6 (six) hours as needed for severe pain. 08/28/18   10/28/18, MD  pantoprazole (PROTONIX) 40 MG tablet TAKE ONE TABLET BY MOUTH TWICE DAILY Patient taking differently: Take 40 mg by mouth 2 (two) times daily.  11/26/17   01/24/18, MD  Polyethylene Glycol 3350 (MIRALAX PO) Take by mouth at bedtime.    [provider]  promethazine (PHENERGAN) 25 MG tablet Take 1 tablet (25 mg total) by mouth every 8 (eight) hours as needed for nausea or vomiting. 08/14/17   08/16/17, PA-C  tiZANidine (ZANAFLEX) 4 MG tablet Take 4 mg by mouth every 6 (six) hours as needed for muscle spasms.  11/18/16   [provider]  venlafaxine XR (EFFEXOR-XR) 150 MG 24 hr capsule Take 2 capsules (300 mg total) by mouth daily before breakfast. 11/06/16   11/08/16, MD  zonisamide (ZONEGRAN) 25 MG capsule TAKE THREE CAPSULES BY MOUTH AT BEDTIME 03/16/19   [provider]    Allergies    Nickel, Nsaids, Oxybutynin, Adhesive [tape], Metoclopramide hcl, Prochlorperazine edisylate, and Sulfa antibiotics  Review of Systems   Review of Systems  Constitutional: Positive for chills and fever.  Respiratory: Positive for cough and shortness of breath.   Cardiovascular: Positive for chest pain.  Gastrointestinal: Positive for abdominal pain, diarrhea and vomiting.  Neurological: Positive for light-headedness.  All other systems reviewed and are negative.   Physical Exam Updated Vital Signs BP 113/67 (BP Location: Right Arm)    Pulse (!) 53    Temp  97.9 F (36.6 C)    Resp 16    LMP  08/26/2018    SpO2 100%   Physical Exam Vitals and nursing note reviewed.  Constitutional:      General: She is not in acute distress.    Appearance: She is well-developed. She is not ill-appearing or diaphoretic.  HENT:     Head: Normocephalic and atraumatic.     Right Ear: External ear normal.     Left Ear: External ear normal.     Nose: Nose normal.  Eyes:     General:        Right eye: No discharge.        Left eye: No discharge.  Cardiovascular:     Rate and Rhythm: Regular rhythm. Bradycardia present.     Heart sounds: Normal heart sounds.  Pulmonary:     Effort: Pulmonary effort is normal.     Breath sounds: Normal breath sounds. No wheezing, rhonchi or rales.  Abdominal:     Palpations: Abdomen is soft.     Tenderness: There is abdominal tenderness (generalized, mild).  Skin:    General: Skin is warm and dry.  Neurological:     Mental Status: She is alert.  Psychiatric:        Mood and Affect: Mood is not anxious.     ED Results / Procedures / Treatments   Labs (all labs ordered are listed, but only abnormal results are displayed) Labs Reviewed  COMPREHENSIVE METABOLIC PANEL - Abnormal; Notable for the following components:      Result Value   CO2 19 (*)    All other components within normal limits  CBC WITH DIFFERENTIAL/PLATELET - Abnormal; Notable for the following components:   Lymphs Abs 4.2 (*)    All other components within normal limits  SARS CORONAVIRUS 2 BY RT PCR (HOSPITAL ORDER[facility])  LIPASE, BLOOD  URINALYSIS, ROUTINE W REFLEX MICROSCOPIC  TROPONIN I (HIGH SENSITIVITY)    EKG EKG Interpretation  Date/Time:  Friday July 13 2020 14:30:09 EDT Ventricular Rate:  73 PR Interval:    QRS Duration: 90 QT Interval:  405 QTC Calculation: 447 R Axis:   71 Text Interpretation: Sinus rhythm Borderline repolarization abnormality 12 Lead; Mason[place]ar Confirmed by Pricilla LovelessGoldston, Achillies Buehl  [phone]([id number]) on 07/13/2020 8:38:45 PM   Radiology DG Chest 2 View  Result Date: 07/13/2020 CLINICAL DATA:  Pt arrived via POV, c/o CP, SOB, headache, fever/chills, n/v and diarrhea since mon night. States she also has no sense of taste or smell. States husband is sick with same sx and he has been in contact with COVID (+) coworkers. EXAM: CHEST - 2 VIEW COMPARISON:  Chest radiograph 03/18/2019 FINDINGS: The heart size and mediastinal contours are within normal limits. The lungs are clear. No pneumothorax or pleural effusion. No acute finding in the visualized skeleton. IMPRESSION: No acute cardiopulmonary process. Electronically Signed   By: Emmaline KluverNancy  Ballantyne M.D.   On: 07/13/2020 15:02   CT ABDOMEN PELVIS W CONTRAST  Result Date: 07/13/2020 CLINICAL DATA:  Nausea and vomiting EXAM: CT ABDOMEN AND PELVIS WITH CONTRAST TECHNIQUE: Multidetector CT imaging of the abdomen and pelvis was performed using the standard protocol following bolus administration of intravenous contrast. CONTRAST:  100mL OMNIPAQUE IOHEXOL 300 MG/ML  SOLN COMPARISON:  CT 04/05/2019, 03/10/2017 FINDINGS: Lower chest: Lung bases demonstrate dependent atelectasis. Cardiac size within normal limits. Hepatobiliary: No focal liver abnormality is seen. Status post cholecystectomy. No biliary dilatation. Pancreas: Unremarkable. No pancreatic ductal dilatation or surrounding inflammatory changes. Spleen: Normal in size without focal abnormality. Adrenals/Urinary Tract:  Adrenal glands are unremarkable. Kidneys are normal, without renal calculi, focal lesion, or hydronephrosis. Bladder is unremarkable. Stomach/Bowel: Stomach is nonenlarged. No dilated small bowel. Status post appendectomy. Hyperdense material within the right colon. No bowel wall thickening. Vascular/Lymphatic: No significant vascular findings are present. No enlarged abdominal or pelvic lymph nodes. Reproductive: Status post hysterectomy. No adnexal masses. Other: Negative for free air  or free fluid. Musculoskeletal: No acute or significant osseous findings. IMPRESSION: No CT evidence for acute intra-abdominal or pelvic abnormality. Electronically Signed   By: Jasmine Pang M.D.   On: 07/13/2020 23:41    Procedures Procedures (including critical care time)  Medications Ordered in ED Medications  sodium chloride 0.9 % bolus 1,000 mL (0 mLs Intravenous Stopped 07/14/20 0007)  ondansetron (ZOFRAN) injection 4 mg (4 mg Intravenous Given 07/13/20 2217)  HYDROmorphone (DILAUDID) injection 1 mg (1 mg Intravenous Given 07/13/20 2218)  iohexol (OMNIPAQUE) 300 MG/ML solution 100 mL (100 mLs Intravenous Contrast Given 07/13/20 2238)  fentaNYL (SUBLIMAZE) injection 50 mcg (50 mcg Intravenous Given 07/14/20 0001)  ondansetron (ZOFRAN) injection 4 mg (4 mg Intravenous Given 07/14/20 0001)    ED Course  I have reviewed the triage vital signs and the nursing notes.  Pertinent labs & imaging results that were available during my care of the patient were reviewed by me and considered in my medical decision making (see chart for details).    MDM Rules/Calculators/A&P                          Patient has numerous symptoms consistent with a viral illness.  Surprisingly her Covid testing is negative.  CT was obtained given the generalized abdominal tenderness and this has been reviewed and is unremarkable.  Labs are reassuring.  Mildly low bicarbonate is probably from diarrhea.  No anion gap acidosis.  Will treat pain and nausea but otherwise she has not had any vomiting and has been given IV fluids and appears stable for discharge home with return precautions. Final Clinical Impression(s) / ED Diagnoses Final diagnoses:  Viral gastroenteritis    Rx / DC Orders ED Discharge Orders         Ordered    ondansetron (ZOFRAN ODT) 4 MG disintegrating tablet  Every 8 hours PRN        07/13/20 2357           Pricilla Loveless, MD 07/14/20 0017

## 2020-07-13 NOTE — Discharge Instructions (Signed)
If you develop worsening, continued, or recurrent abdominal pain, uncontrolled vomiting, fever, chest or back pain, or any other new/concerning symptoms then return to the ER for evaluation.   If you continue to have symptoms over the next 2-3 days, you should get rechecked for Covid-19.

## 2020-07-13 NOTE — ED Triage Notes (Signed)
Pt arrived via POV, c/o CP, SOB, headache, fever/chills, n/v and diarrhea since mon night. States she also has no sense of taste or smell. States husband is sick with same sx and he has been in contact with COVID (+) coworkers.   Took tylenol at home for fever with good effect.

## 2020-11-02 IMAGING — CR CHEST - 2 VIEW
2 series · 2 of 2 positions shown · non-contrast
Comparison: 12/01/2018

CLINICAL DATA: Shortness of breath, nausea for 1 week

EXAM:
CHEST - 2 VIEW

[w chest pa]
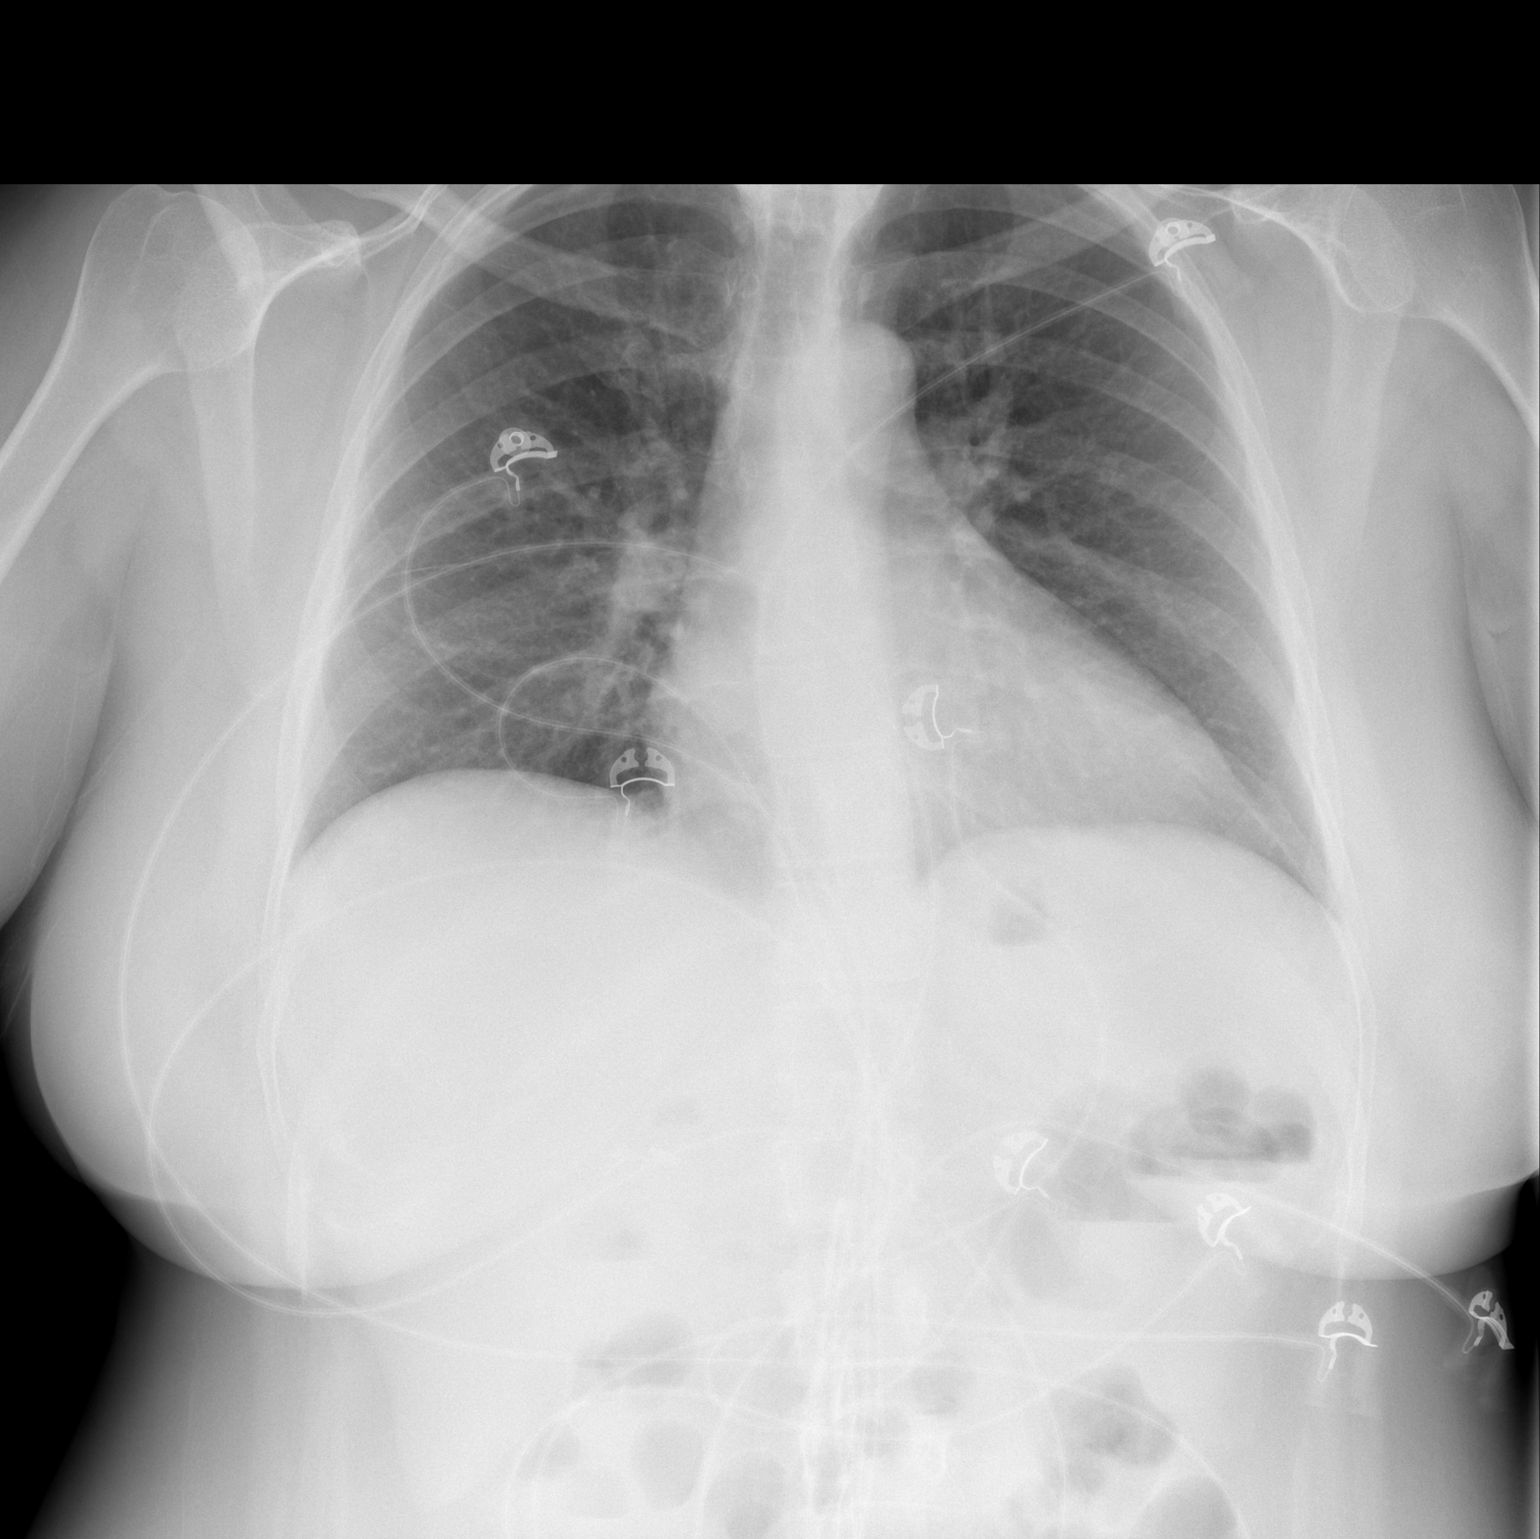

[w chest lat]
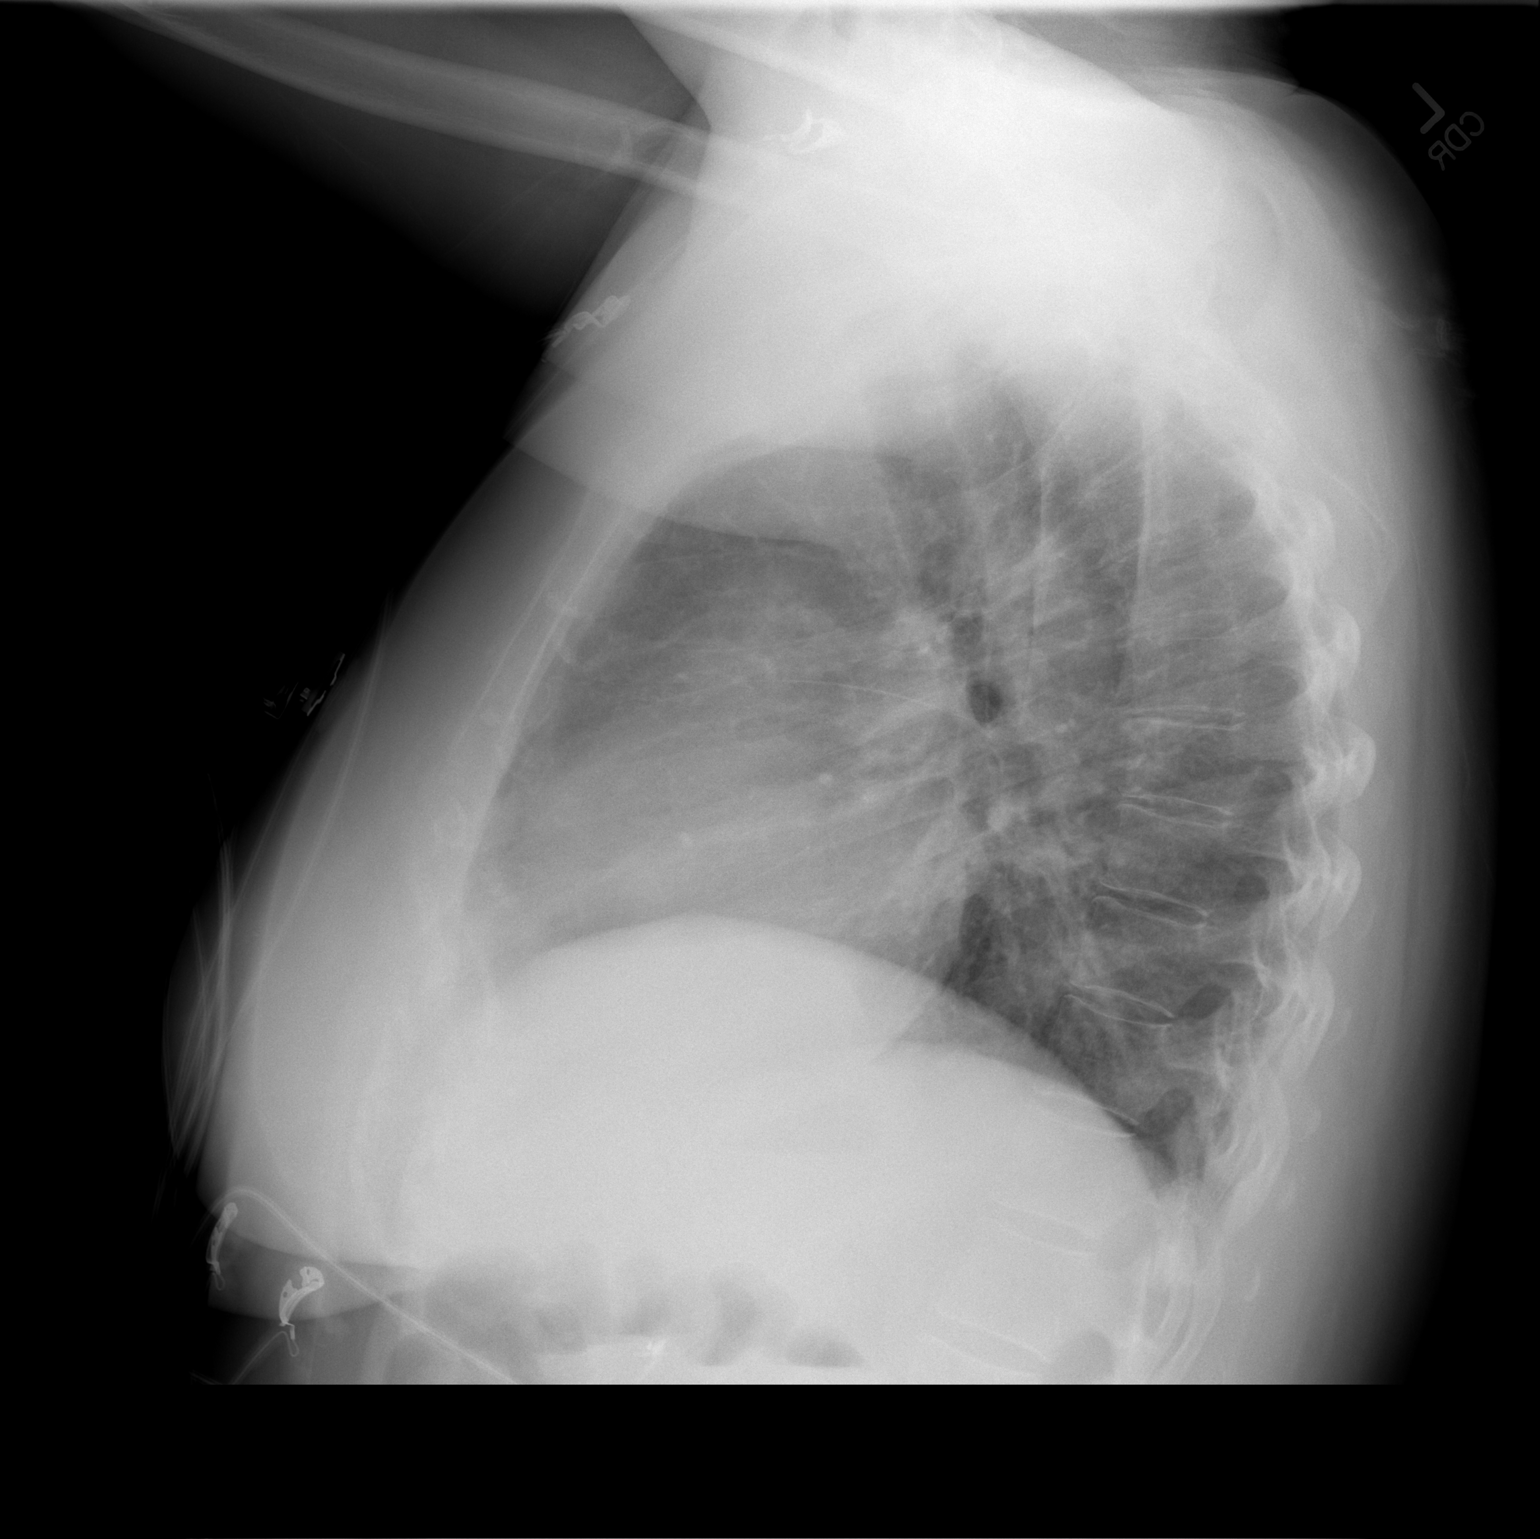

[2 of 2 positions shown; findings below may reference images not displayed]

FINDINGS: The heart size and mediastinal contours are within normal limits.
Both lungs are clear. The visualized skeletal structures are
unremarkable.
IMPRESSION: No active cardiopulmonary disease.

## 2021-07-20 ENCOUNTER — Other Ambulatory Visit: Payer: Self-pay

## 2021-07-20 ENCOUNTER — Emergency Department (HOSPITAL_COMMUNITY): Payer: Managed Care, Other (non HMO)

## 2021-07-20 ENCOUNTER — Emergency Department (HOSPITAL_COMMUNITY)
Admission: EM | Admit: 2021-07-20 | Discharge: 2021-07-20 | Disposition: A | Discharge status: Home / Self Care | Payer: Managed Care, Other (non HMO) | Attending: Emergency Medicine | Admitting: Emergency Medicine

## 2021-07-20 ENCOUNTER — Encounter (HOSPITAL_COMMUNITY): Payer: Self-pay | Admitting: Emergency Medicine

## 2021-07-20 DIAGNOSIS — R600 Localized edema: Secondary | ICD-10-CM

## 2021-07-20 DIAGNOSIS — R0789 Other chest pain: Secondary | ICD-10-CM | POA: Insufficient documentation

## 2021-07-20 DIAGNOSIS — R079 Chest pain, unspecified: Secondary | ICD-10-CM | POA: Diagnosis present

## 2021-07-20 DIAGNOSIS — N9489 Other specified conditions associated with female genital organs and menstrual cycle: Secondary | ICD-10-CM | POA: Diagnosis not present

## 2021-07-20 DIAGNOSIS — F17210 Nicotine dependence, cigarettes, uncomplicated: Secondary | ICD-10-CM | POA: Diagnosis not present

## 2021-07-20 DIAGNOSIS — K219 Gastro-esophageal reflux disease without esophagitis: Secondary | ICD-10-CM | POA: Diagnosis not present

## 2021-07-20 DIAGNOSIS — R609 Edema, unspecified: Secondary | ICD-10-CM | POA: Diagnosis not present

## 2021-07-20 LAB — CBC
HCT: 37.6 % (ref 36.0–46.0)
Hemoglobin: 12.2 g/dL (ref 12.0–15.0)
MCH: 29.8 pg (ref 26.0–34.0)
MCHC: 32.4 g/dL (ref 30.0–36.0)
MCV: 91.9 fL (ref 80.0–100.0)
Platelets: 248 10*3/uL (ref 150–400)
RBC: 4.09 MIL/uL (ref 3.87–5.11)
RDW: 12.5 % (ref 11.5–15.5)
WBC: 5.9 10*3/uL (ref 4.0–10.5)
nRBC: 0 % (ref 0.0–0.2)

## 2021-07-20 LAB — TROPONIN I (HIGH SENSITIVITY)
Troponin I (High Sensitivity): 4 ng/L (ref <18)
Troponin I (High Sensitivity): 3 ng/L (ref <18)

## 2021-07-20 LAB — BASIC METABOLIC PANEL
Anion gap: 8 (ref 5–15)
BUN: 11 mg/dL (ref 6–20)
CO2: 22 mmol/L (ref 22–32)
Calcium: 9.2 mg/dL (ref 8.9–10.3)
Chloride: 107 mmol/L (ref 98–111)
Creatinine, Ser: 0.69 mg/dL (ref 0.44–1.00)
GFR, Estimated: >60 mL/min (ref >60)
Glucose, Bld: 98 mg/dL (ref 70–99)
Potassium: 3.5 mmol/L (ref 3.5–5.1)
Sodium: 137 mmol/L (ref 135–145)

## 2021-07-20 LAB — I-STAT BETA HCG BLOOD, ED (MC, WL, AP ONLY): I-stat hCG, quantitative: <5 m[IU]/mL (ref <5)

## 2021-07-20 NOTE — ED Notes (Signed)
Pt stepped outside.  

## 2021-07-20 NOTE — ED Triage Notes (Addendum)
Pt reports pain to center of chest since yesterday with SOB, nausea, and swelling to bilateral ankles.  Also reports numbness to L hand and bilateral feet since yesterday.

## 2021-07-20 NOTE — ED Provider Notes (Signed)
[facility]   CSN: [id number] Arrival date & time: 07/20/21  1438     History Chief Complaint  Patient presents with   Chest Pain    Diane [name] is a 40 y.o. female.  The history is provided by the patient and medical records. No language interpreter was used.  Chest Pain   40 year old female with multiple comorbidity presenting c/o cp.  Patient report that she has had intermittent chest discomfort which she described as a pressure sensation with associated shortness of breath.  She also report feeling fatigued, she has noticed bilateral leg swelling and swelling to her hands that cause tingling sensation.  This is an ongoing issue that she does not know what is the underlying cause.  She noted that her blood pressure would be low.  She has seen and evaluated by her PCP and is scheduled to have an echocardiogram at some point in the near future.  Mom concerned and would like to find an answer to her situation.  She is a smoker and smoke approximately a pack of cigarettes a day.  She denies alcohol abuse.  She denies any prior history of CHF.  She does not endorse any active chest pain at this time just fatigue.  No runny nose sneezing coughing no dysuria  Past Medical History:  Diagnosis Date   ADD (attention deficit disorder with hyperactivity)    Anemia    takes iron supplement   Anxiety    Choking    due to food and pills get stuck   Depression    Dysmenorrhea    Family history of adverse reaction to anesthesia    pt's father has hx. of being hard to wake up post-op   GERD (gastroesophageal reflux disease)    History of gastric ulcer    History of kidney stones    IBS (irritable bowel syndrome)    Migraines    neurologist-  dr c. Sharene Skeanshagen ([facility] in [place])-- treated with Emgality injection every 30 days   Mild obstructive sleep apnea    per study in epic 03-21-2017 mild osa, recommendation cpap,  mouth appliance, wt loss   Osteoarthritis    right ankle   Osteochondral lesion 09/2016   right ankle   Osteomyelitis of knee region [facility](HCC)    Peripheral vascular disease (HCC)    NERVE DAMAGE RIGHT LEG AND FOOT DUE TO INJURGY.    Plantar fasciitis of left foot    PONV (postoperative nausea and vomiting)    AND HEADACHES   Pseudotumor cerebri    at back head   S/P dilatation of esophageal stricture 07/2018   Sinusitis 08/12/2018   RESOLVED WITH ANTIOBIOTIC   SUI (stress urinary incontinence, female)    Wears contact lenses    Wears partial dentures    upper    Patient Active Problem List   Diagnosis Date Noted   Dysmenorrhea 08/27/2018   OAB (overactive bladder) 08/12/2017   Dysphagia 08/12/2017   Dry mouth 08/12/2017   Osteochondral lesion 06/03/2017   OSA (obstructive sleep apnea) 03/24/2017   Nausea and vomiting 12/03/2016   RUQ pain    Cholelithiasis and acute cholecystitis without obstruction 12/02/2016   Ankle fracture 01/05/2016   Chronic pain of right knee 04/12/2015   Gastric ulcer 03/16/2015   Chronic cough 12/27/2013   Pleural effusion 12/15/2013   Chronic migraine without aura 04/01/2013   DEPRESSION, ACUTE 10/02/2008   Narcotic dependence (HCC) 10/02/2008  BENIGN INTRACRANIAL HYPERTENSION 06/16/2008   BACK PAIN 03/28/2008   WEIGHT GAIN 01/25/2008   Anxiety state 11/09/2007   HEADACHE 07/09/2007    Past Surgical History:  Procedure Laterality Date   ANKLE ARTHROSCOPY Right 10/22/2016   Procedure: ANKLE ARTHROSCOPY;  Surgeon: Vivi Barrack, DPM;  Location: [facility];  Service: Podiatry;  Laterality: Right;  GENERAL/REG BLOCK   ANKLE ARTHROSCOPY Right 09-15-2017   @[facility]    BLADDER SURGERY  child   x 2 - as a child to stretch bladder   CESAREAN SECTION  12/24/2009   CESAREAN SECTION  11/20/2011   Procedure: CESAREAN SECTION;  Surgeon: 01/18/2012, MD;  Location: [facility];  Service: Gynecology;  Laterality: N/A;   CHOLECYSTECTOMY  N/A 12/03/2016   Procedure: LAPAROSCOPIC CHOLECYSTECTOMY WITH INTRAOPERATIVE CHOLANGIOGRAM;  Surgeon: 12/05/2016, MD;  Location: [facility];  Service: General;  Laterality: N/A;   CYSTOSCOPY WITH RETROGRADE PYELOGRAM, URETEROSCOPY AND STENT PLACEMENT Left 08/10/2006   ESOPHAGOGASTRODUODENOSCOPY (EGD) WITH ESOPHAGEAL DILATION  07/2018   HARDWARE REMOVAL Right 07/01/2016   Procedure: HARDWARE REMOVAL;  Surgeon: 07/03/2016, MD;  Location: [facility];  Service: Orthopedics;  Laterality: Right;   KNEE ARTHROSCOPY WITH LATERAL RELEASE Right 12/13/2014   Procedure: KNEE ARTHROSCOPY WITH LATERAL RELEASE;  Surgeon: 12/15/2014., MD;  Location: [facility];  Service: Orthopedics;  Laterality: Right;   LAPAROSCOPIC APPENDECTOMY N/A 06/11/2014   Procedure: APPENDECTOMY LAPAROSCOPIC;  Surgeon: 06/13/2014, MD;  Location: [facility];  Service: General;  Laterality: N/A;   ORIF ANKLE FRACTURE Right 01/05/2016   Procedure: OPEN REDUCTION INTERNAL FIXATION (ORIF) ANKLE FRACTURE;  Surgeon: 01/07/2016, MD;  Location: [facility];  Service: Orthopedics;  Laterality: Right;   OVARIAN CYST SURGERY Right 03/05/2007   AND CHROMOPERTUBATION  VIA LAPAROSCOPY   ROBOTIC ASSISTED LAPAROSCOPIC HYSTERECTOMY AND SALPINGECTOMY Bilateral 08/27/2018   Procedure: XI ROBOTIC ASSISTED LAPAROSCOPIC HYSTERECTOMY AND BILATERAL SALPINGECTOMY;  Surgeon: 10/27/2018, MD;  Location: [facility];  Service: Gynecology;  Laterality: Bilateral;  [facility] for 23hr OBS   SYNOVECTOMY Right 12/13/2014   Procedure: PLICA SYNOVECTOMY;  Surgeon: 12/15/2014., MD;  Location: [facility];  Service: Orthopedics;  Laterality: Right;   TONSILLECTOMY AND ADENOIDECTOMY  child   WISDOM TOOTH EXTRACTION       OB History     Gravida  2   Para  2   Term  1   Preterm  1   AB  0   Living  2      SAB  0   IAB  0   Ectopic  0   Multiple  0   Live Births  2           Family History  Problem Relation Age of Onset    Brain cancer Father    Anesthesia problems Father        hard to wake up post-op   Lung cancer Father    Breast cancer Maternal Grandmother    Ovarian cancer Maternal Grandmother    Diabetes Maternal Grandmother    Heart disease Maternal Grandmother    Irritable bowel syndrome Mother    Prostate cancer Maternal Grandfather    Melanoma Maternal Grandfather    Colon cancer Neg Hx     Social History   Tobacco Use   Smoking status: Some Days    Packs/day: 0.50    Years: 15.00    Pack years: 7.50    Types: Cigarettes  Smokeless tobacco: Never  Vaping Use   Vaping Use: Former  Substance Use Topics   Alcohol use: Yes    Alcohol/week: 0.0 standard drinks    Comment: occasional   Drug use: No    Home Medications Prior to Admission medications   Medication Sig Start Date End Date Taking? Authorizing Provider  ADDERALL XR 20 MG 24 hr capsule Take 20 mg by mouth every morning. 07/09/18   [provider]  albuterol (PROVENTIL HFA;VENTOLIN HFA) 108 (90 Base) MCG/ACT inhaler Inhale 2 puffs into the lungs every 6 (six) hours as needed for wheezing or shortness of breath.     [provider]  chlorpheniramine-HYDROcodone (TUSSIONEX) 10-8 MG/5ML SUER Take by mouth. 03/16/19   [provider]  diazepam (VALIUM) 5 MG tablet Take 1 tablet (5 mg total) by mouth every 12 (twelve) hours as needed for anxiety. Patient not taking: Reported on 12/01/2018 11/05/16   Nelwyn Salisbury, MD  diclofenac sodium (VOLTAREN) 1 % GEL APPLY ONE APPLICATION FOUR TIMES DAILY AS NEEDED 11/05/18   [provider]  dicyclomine (BENTYL) 20 MG tablet TAKE ONE TABLET BY MOUTH 3 TIMES DAILY BEFORE MEALS Patient taking differently: Take 20 mg by mouth 4 (four) times daily -  before meals and at bedtime.  06/03/18   Meryl Dare, MD  EMGALITY 120 MG/ML SOAJ Inject 120 mg into the skin every 30 (thirty) days. 08/02/18   [provider]  gabapentin (NEURONTIN) 300 MG capsule  TAKE 2 CAPSULES BY MOUTH 3 TIMES DAILY Patient taking differently: Take 600-1,200 mg by mouth See admin instructions. Take 600mg  by mouth in the morning and 1,200mg  in the evening 05/26/17   07/27/17, MD  levocetirizine (XYZAL) 5 MG tablet Take 5 mg by mouth every evening. 11/23/18   [provider]  lidocaine (XYLOCAINE) 2 % solution Use as directed 15 mLs in the mouth or throat as needed for mouth pain.    [provider]  magic mouthwash SOLN Take 5 mLs by mouth 4 (four) times daily as needed for mouth pain.    [provider]  Magnesium 400 MG TABS Take 1,200 mg by mouth at bedtime.     [provider]  ondansetron (ZOFRAN ODT) 4 MG disintegrating tablet Take 1 tablet (4 mg total) by mouth every 8 (eight) hours as needed for nausea or vomiting. 07/13/20   07/15/20, MD  oxyCODONE-acetaminophen (PERCOCET/ROXICET) 5-325 MG tablet Take 1-2 tablets by mouth every 6 (six) hours as needed for severe pain. 08/28/18   10/28/18, MD  pantoprazole (PROTONIX) 40 MG tablet TAKE ONE TABLET BY MOUTH TWICE DAILY Patient taking differently: Take 40 mg by mouth 2 (two) times daily.  11/26/17   01/24/18, MD  Polyethylene Glycol 3350 (MIRALAX PO) Take by mouth at bedtime.    [provider]  promethazine (PHENERGAN) 25 MG tablet Take 1 tablet (25 mg total) by mouth every 8 (eight) hours as needed for nausea or vomiting. 08/14/17   08/16/17, PA-C  tiZANidine (ZANAFLEX) 4 MG tablet Take 4 mg by mouth every 6 (six) hours as needed for muscle spasms.  11/18/16   [provider]  venlafaxine XR (EFFEXOR-XR) 150 MG 24 hr capsule Take 2 capsules (300 mg total) by mouth daily before breakfast. 11/06/16   11/08/16, MD  zonisamide (ZONEGRAN) 25 MG capsule TAKE THREE CAPSULES BY MOUTH AT BEDTIME 03/16/19   [provider]    Allergies  Nickel, Nsaids, Oxybutynin, Adhesive [tape], Metoclopramide hcl, Prochlorperazine  edisylate, and Sulfa antibiotics  Review of Systems   Review of Systems  Cardiovascular:  Positive for chest pain.  All other systems reviewed and are negative.  Physical Exam Updated Vital Signs BP (!) 104/59   Pulse 62   Temp 97.7 F (36.5 C) (Oral)   Resp 15   LMP 08/26/2018   SpO2 97%   Physical Exam Vitals and nursing note reviewed.  Constitutional:      General: She is not in acute distress.    Appearance: She is well-developed. She is obese.  HENT:     Head: Atraumatic.  Eyes:     Conjunctiva/sclera: Conjunctivae normal.  Neck:     Vascular: No JVD.  Cardiovascular:     Rate and Rhythm: Normal rate and regular rhythm.     Pulses: Normal pulses.     Heart sounds: Normal heart sounds.  Pulmonary:     Effort: Pulmonary effort is normal.     Breath sounds: No wheezing, rhonchi or rales.  Abdominal:     Palpations: Abdomen is soft.     Tenderness: There is no abdominal tenderness.  Musculoskeletal:     Cervical back: Neck supple.     Right lower leg: Edema present.     Left lower leg: Edema present.     Comments: 1+ nonpitting edema to bilateral lower extremities involving the dorsum of the foot without any erythema or warmth.  Intact pedal pulses.  5 out of 5 strength to all 4 extremities.  Skin:    Capillary Refill: Capillary refill takes less than 2 seconds.     Findings: No rash.  Neurological:     Mental Status: She is alert and oriented to person, place, and time.  Psychiatric:        Mood and Affect: Mood normal.    ED Results / Procedures / Treatments   Labs (all labs ordered are listed, but only abnormal results are displayed) Labs Reviewed  BASIC METABOLIC PANEL  CBC  I-STAT BETA HCG BLOOD, ED ([facility], [facility], [facility] ONLY)  TROPONIN I (HIGH SENSITIVITY)  TROPONIN I (HIGH SENSITIVITY)    EKG None  Date: 07/20/2021  Rate: 66  Rhythm: normal sinus rhythm  QRS Axis: normal  Intervals: normal  ST/T Wave abnormalities: normal  Conduction  Disutrbances: none  Narrative Interpretation:   Old EKG Reviewed: No significant changes noted    Radiology DG Chest 2 View  Result Date: 07/20/2021 CLINICAL DATA:  Chest pain with shortness of breath. EXAM: CHEST - 2 VIEW COMPARISON:  Radiographs 07/13/2020 and 03/18/2019.  CT 12/13/2013. FINDINGS: The heart size and mediastinal contours are normal. The lungs are clear. There is no pleural effusion or pneumothorax. No acute osseous findings are identified. Cholecystectomy clips are noted. IMPRESSION: Stable chest.  No active cardiopulmonary process. Electronically Signed   By: Carey Bullocks M.D.   On: 07/20/2021 15:38    Procedures Procedures   Medications Ordered in ED Medications - No data to display  ED Course  I have reviewed the triage vital signs and the nursing notes.  Pertinent labs & imaging results that were available during my care of the patient were reviewed by me and considered in my medical decision making (see chart for details).    MDM Rules/Calculators/A&P                           BP (!) 104/59  Pulse 62   Temp 97.7 F (36.5 C) (Oral)   Resp 15   LMP 08/26/2018   SpO2 97%   Final Clinical Impression(s) / ED Diagnoses Final diagnoses:  Atypical chest pain  Mild peripheral edema    Rx / DC Orders ED Discharge Orders     None      10:47 PM Patient complains of chest pressure and shortness of breath leg swelling tingling sensation to hands and feet which appears to be a recurrent situation.  States she was seen by her PCP yesterday and was placed on Lasix.  She took once yesterday.  Today her EKG, delta troponin, and labs are reassuring.  Chest x-ray unremarkable.  Lungs clear to auscultation.  Symptoms not consistent with ACS.  No obvious signs to suggest kidney failure or CHF.  She is likely benefit from outpatient follow-up which may include TSH, echocardiogram and other assessment for her complaint.  Recommend patient to wear compressive  stocking, and to follow-up with her PCP for further care.  Return precaution given.   Fayrene Helper, PA-C 07/20/21 2251    Derwood Kaplan, MD 07/21/21 [phone]

## 2021-07-20 NOTE — Discharge Instructions (Addendum)
You have been evaluated for your leg swelling and generalized fatigue.  Fortunately your EKG, blood work, and cardiac enzymes did not show any concerning finding.  Your chest x-ray is normal.  Please continue taking Lasix as prescribed by your primary care provider.  Wear compressive stocking and keep legs elevated to help with swelling.  Follow-up outpatient with your doctor as needed.  An echocardiogram outpatient can be beneficial.  Return if you have any concern.

## 2021-07-26 ENCOUNTER — Encounter: Payer: Self-pay | Admitting: Cardiovascular Disease

## 2021-07-26 ENCOUNTER — Other Ambulatory Visit: Payer: Self-pay

## 2021-07-26 ENCOUNTER — Ambulatory Visit: Payer: Managed Care, Other (non HMO) | Admitting: Cardiovascular Disease

## 2021-07-26 VITALS — BP 110/80 | HR 60 | Ht 67.0 in | Wt 194.0 lb

## 2021-07-26 DIAGNOSIS — R600 Localized edema: Secondary | ICD-10-CM

## 2021-07-26 DIAGNOSIS — Z79899 Other long term (current) drug therapy: Secondary | ICD-10-CM

## 2021-07-26 DIAGNOSIS — R0789 Other chest pain: Secondary | ICD-10-CM | POA: Diagnosis not present

## 2021-07-26 DIAGNOSIS — Z01812 Encounter for preprocedural laboratory examination: Secondary | ICD-10-CM | POA: Diagnosis not present

## 2021-07-26 DIAGNOSIS — R072 Precordial pain: Secondary | ICD-10-CM | POA: Diagnosis not present

## 2021-07-26 HISTORY — DX: Other chest pain: R07.89

## 2021-07-26 HISTORY — DX: Localized edema: R60.0

## 2021-07-26 LAB — LIPID PANEL
Chol/HDL Ratio: 7 ratio — ABNORMAL HIGH (ref 0.0–4.4)
Cholesterol, Total: 246 mg/dL — ABNORMAL HIGH (ref 100–199)
HDL: 35 mg/dL — ABNORMAL LOW (ref >39)
LDL Chol Calc (NIH): 180 mg/dL — ABNORMAL HIGH (ref 0–99)
Triglycerides: 167 mg/dL — ABNORMAL HIGH (ref 0–149)
VLDL Cholesterol Cal: 31 mg/dL (ref 5–40)

## 2021-07-26 LAB — BASIC METABOLIC PANEL
BUN/Creatinine Ratio: 13 (ref 9–23)
BUN: 9 mg/dL (ref 6–20)
CO2: 22 mmol/L (ref 20–29)
Calcium: 9.5 mg/dL (ref 8.7–10.2)
Chloride: 103 mmol/L (ref 96–106)
Creatinine, Ser: 0.67 mg/dL (ref 0.57–1.00)
Glucose: 90 mg/dL (ref 65–99)
Potassium: 4.1 mmol/L (ref 3.5–5.2)
Sodium: 139 mmol/L (ref 134–144)
eGFR: 115 mL/min/{1.73_m2} (ref >59)

## 2021-07-26 LAB — HEPATIC FUNCTION PANEL
ALT: 16 IU/L (ref 0–32)
AST: 20 IU/L (ref 0–40)
Albumin: 4.4 g/dL (ref 3.8–4.8)
Alkaline Phosphatase: 101 IU/L (ref 44–121)
Bilirubin Total: 0.2 mg/dL (ref 0.0–1.2)
Bilirubin, Direct: <0.1 mg/dL (ref 0.00–0.40)
Total Protein: 6.8 g/dL (ref 6.0–8.5)

## 2021-07-26 NOTE — Patient Instructions (Addendum)
Medication Instructions:  Your Physician recommend you continue on your current medication as directed.    *If you need a refill on your cardiac medications before your next appointment, please call your pharmacy*   Lab Work: Your physician recommends lab work today (BMP, Lipid, LFT)  If you have labs (blood work) drawn today and your tests are completely normal, you will receive your results only by: MyChart Message (if you have MyChart) OR A paper copy in the mail If you have any lab test that is abnormal or we need to change your treatment, we will call you to review the results.   Testing/Procedures: Your physician has requested that you have an echocardiogram. Echocardiography is a painless test that uses sound waves to create images of your heart. It provides your doctor with information about the size and shape of your heart and how well your heart's chambers and valves are working. This procedure takes approximately one hour. There are no restrictions for this procedure. [address]  Cardiac CT Angiography (CTA), is a special type of CT scan that uses a computer to produce multi-dimensional views of major blood vessels throughout the body. In CT angiography, a contrast material is injected through an IV to help visualize the blood vessels  [facility]   Follow-Up: At [facility], you and your health needs are our priority.  As part of our continuing mission to provide you with exceptional heart care, we have created designated Provider Care Teams.  These Care Teams include your primary Cardiologist (physician) and Advanced Practice Providers (APPs -  Physician Assistants and Nurse Practitioners) who all work together to provide you with the care you need, when you need it.  We recommend signing up for the patient portal called "MyChart".  Sign up information is provided on this After Visit Summary.  MyChart is used to connect with patients for Virtual  Visits (Telemedicine).  Patients are able to view lab/test results, encounter notes, upcoming appointments, etc.  Non-urgent messages can be sent to your provider as well.   To learn more about what you can do with MyChart, go to [email or web address].    Your next appointment:   As needed   The format for your next appointment:   In Person  Provider:   Nanetta Batty, MD     Your cardiac CT will be scheduled at one of the below locations:   [facility] [address] [place], [place] [address] [phone]   If scheduled at [facility], please arrive at the [facility] main entrance (entrance A) of [facility] 30 minutes prior to test start time. Proceed to the [facility] Radiology Department (first floor) to check-in and test prep.  If scheduled at [facility], please arrive 15 mins early for check-in and test prep.  Please follow these instructions carefully (unless otherwise directed):   On the Night Before the Test: Be sure to Drink plenty of water. Do not consume any caffeinated/decaffeinated beverages or chocolate 12 hours prior to your test. Do not take any antihistamines 12 hours prior to your test.   On the Day of the Test: Drink plenty of water until 1 hour prior to the test. Do not eat any food 4 hours prior to the test. You may take your regular medications prior to the test.  Take Propranolol 160 mg two hours prior to test. HOLD Furosemide/morning of the test. FEMALES- please wear underwire-free bra if  available, avoid dresses & tight clothing          After the Test: Drink plenty of water. After receiving IV contrast, you may experience a mild flushed feeling. This is normal. On occasion, you may experience a mild rash up to 24 hours after the test. This is not dangerous. If this occurs, you can take Benadryl 25 mg and increase your fluid intake. If you experience trouble breathing, this can  be serious. If it is severe call 911 IMMEDIATELY. If it is mild, please call our office. If you take any of these medications: Glipizide/Metformin, Avandament, Glucavance, please do not take 48 hours after completing test unless otherwise instructed.  Please allow 2-4 weeks for scheduling of routine cardiac CTs. Some insurance companies require a pre-authorization which may delay scheduling of this test.   For non-scheduling related questions, please contact the cardiac imaging nurse navigator should you have any questions/concerns: Rockwell Alexandria, Cardiac Imaging Nurse Navigator Larey Brick, Cardiac Imaging Nurse Navigator [facility] Direct Office Dial: [phone]   For scheduling needs, including cancellations and rescheduling, please call [place], [phone].

## 2021-07-26 NOTE — Assessment & Plan Note (Signed)
Procedure has noticed bilateral lower extremity edema.  She was begun on oral diuretic by her primary care physician which has resulted in significant improvement.  She eats minimal salt.  We will check a 2D echo to further evaluate

## 2021-07-26 NOTE — Progress Notes (Signed)
07/26/2021 KHADEJA ABT   04-24-1981  [id number]  Primary Physician Rick Duff, PA-C Primary Cardiologist: Runell Gess MD Milagros Loll, [place], [place]  HPI:  Diane [name] is a 40 y.o. moderately overweight married Caucasian female mother of 2 children who is accompanied by her mother and daughter today.  She was referred by Betsey Holiday, PA-C for evaluation of atypical chest pain.  She has no cardiac risk factors other than tobacco abuse having smoked 1 pack a day for last 20 years.  We talked about the importance of smoking cessation.  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  She is never had a heart attack or stroke.  She says she occasionally gets hypotensive.  She was seen in the ER this past weekend with atypical chest pain.  Enzymes were negative and she had no acute changes on her EKG.  She was sent home with suggestions for outpatient follow-up.  The suspicion was very low for ACS.   Current Meds  Medication Sig   ADDERALL XR 20 MG 24 hr capsule Take 20 mg by mouth every morning.   albuterol (PROVENTIL HFA;VENTOLIN HFA) 108 (90 Base) MCG/ACT inhaler Inhale 2 puffs into the lungs every 6 (six) hours as needed for wheezing or shortness of breath.    ALPRAZolam (XANAX) 0.25 MG tablet SMARTSIG:0.5 Tablet(s) By Mouth PRN   amitriptyline (ELAVIL) 10 MG tablet amitriptyline 10 mg tablet  Take 1 tablet by mouth at bedtime for post traumatic headaches   Bacillus Coagulans-Inulin (PROBIOTIC) 1-250 BILLION-MG CAPS    Biotin 10 MG TABS    buprenorphine (SUBUTEX) 2 MG SUBL SL tablet Place 2 mg under the tongue 2 (two) times daily as needed.   celecoxib (CELEBREX) 50 MG capsule PLEASE SEE ATTACHED FOR DETAILED DIRECTIONS   chlorpheniramine-HYDROcodone (TUSSIONEX) 10-8 MG/5ML SUER Take by mouth.   diclofenac sodium (VOLTAREN) 1 % GEL APPLY ONE APPLICATION FOUR TIMES DAILY AS NEEDED    dicyclomine (BENTYL) 20 MG tablet TAKE ONE TABLET BY MOUTH 3 TIMES DAILY BEFORE MEALS (Patient taking differently: Take 20 mg by mouth 4 (four) times daily -  before meals and at bedtime.)   diphenoxylate-atropine (LOMOTIL) 2.5-0.025 MG tablet Take 1 tablet by mouth 4 (four) times daily as needed.   EMGALITY 120 MG/ML SOAJ Inject 120 mg into the skin every 30 (thirty) days.   escitalopram (LEXAPRO) 10 MG tablet    famotidine (PEPCID) 40 MG tablet Take 40 mg by mouth at bedtime.   FLOVENT HFA 110 MCG/ACT inhaler Inhale 2 puffs into the lungs 2 (two) times daily.   fluticasone (FLONASE) 50 MCG/ACT nasal spray Place into the nose.   furosemide (LASIX) 20 MG tablet Take 20 mg by mouth daily.   gabapentin (NEURONTIN) 300 MG capsule TAKE 2 CAPSULES BY MOUTH 3 TIMES DAILY (Patient taking differently: Take 600-1,200 mg by mouth See admin instructions. Take 600mg  by mouth in the morning and 1,200mg  in the evening)   glycopyrrolate (ROBINUL) 1 MG tablet Take 1 mg by mouth 2 (two) times daily.   levocetirizine (XYZAL) 5 MG tablet Take 5 mg by mouth every evening.   lidocaine (XYLOCAINE) 2 % solution Use as directed 15 mLs in the mouth or throat as needed for mouth pain.   LINZESS 290 MCG CAPS capsule Take 290 mcg by mouth daily.   lubiprostone (AMITIZA) 24 MCG capsule Take by mouth.  magic mouthwash SOLN Take 5 mLs by mouth 4 (four) times daily as needed for mouth pain.   Magnesium 400 MG TABS Take 1,200 mg by mouth at bedtime.    meclizine (ANTIVERT) 25 MG tablet Take 12.5 mg by mouth 3 (three) times daily as needed.   Multiple Vitamins-Minerals (EQL CENTURY WOMENS) TABS    naloxone (NARCAN) nasal spray 4 mg/0.1 mL SMARTSIG:Both Nares   omeprazole (PRILOSEC) 40 MG capsule Take 40 mg by mouth daily.   ondansetron (ZOFRAN ODT) 4 MG disintegrating tablet Take 1 tablet (4 mg total) by mouth every 8 (eight) hours as needed for nausea or vomiting.   oxyCODONE (ROXICODONE) 15 MG immediate release tablet Take 15  mg by mouth 5 (five) times daily.   pantoprazole (PROTONIX) 40 MG tablet TAKE ONE TABLET BY MOUTH TWICE DAILY (Patient taking differently: Take 40 mg by mouth 2 (two) times daily.)   Polyethylene Glycol 3350 (MIRALAX PO) Take by mouth at bedtime.   promethazine (PHENERGAN) 25 MG tablet Take 1 tablet (25 mg total) by mouth every 8 (eight) hours as needed for nausea or vomiting.   promethazine (PHENERGAN) 25 MG tablet Take by mouth.   propranolol ER (INDERAL LA) 160 MG SR capsule Take by mouth.   Rimegepant Sulfate (NURTEC) 75 MG TBDP Nurtec ODT 75 mg disintegrating tablet  TAKE ONE TABLET BY MOUTH AS NEEDED. one PER 24 hours   selenium 200 MCG TABS tablet    sucralfate (CARAFATE) 1 g tablet Take 1 g by mouth 4 (four) times daily.   tiZANidine (ZANAFLEX) 4 MG tablet Take 4 mg by mouth every 6 (six) hours as needed for muscle spasms.    UBRELVY 100 MG TABS Take by mouth.   venlafaxine XR (EFFEXOR-XR) 150 MG 24 hr capsule Take 2 capsules (300 mg total) by mouth daily before breakfast.   Vitamin A 2400 MCG (8000 UT) TABS    Zinc 50 MG TABS    zonisamide (ZONEGRAN) 25 MG capsule TAKE THREE CAPSULES BY MOUTH AT BEDTIME     Allergies  Allergen Reactions   Nickel     "any jewelry that's not real"/ rash and itching    Nsaids Other (See Comments)    DUE TO HISTORY OF GASTRIC ULCER   Oxybutynin Other (See Comments)    "severe dry mouth"   Adhesive [Tape] Rash   Metoclopramide Hcl Other (See Comments)    RESTLESSNESS/JITTERY   Prochlorperazine Edisylate Other (See Comments)    RESTLESSNESS/JITTERY    Sulfa Antibiotics Rash    Social History   Socioeconomic History   Marital status: Married    Spouse name: Not on file   Number of children: 2   Years of education: Not on file   Highest education level: Not on file  Occupational History   Occupation: Investment banker, 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  Tobacco Use   Smoking status: Some Days    Packs/day: 0.50    Years: 15.00     Pack years: 7.50    Types: Cigarettes   Smokeless tobacco: Never  Vaping Use   Vaping Use: Former  Substance and Sexual Activity   Alcohol use: Yes    Alcohol/week: 0.0 standard drinks    Comment: occasional   Drug use: No   Sexual activity: Yes    Birth control/protection: None  Other Topics Concern   Not on file  Social History Narrative   Not on file   Social Determinants of Health  Financial Resource Strain: Not on file  Food Insecurity: Not on file  Transportation Needs: Not on file  Physical Activity: Not on file  Stress: Not on file  Social Connections: Not on file  Intimate Partner Violence: Not on file     Review of Systems: General: negative for chills, fever, night sweats or weight changes.  Cardiovascular: negative for chest pain, dyspnea on exertion, edema, orthopnea, palpitations, paroxysmal nocturnal dyspnea or shortness of breath Dermatological: negative for rash Respiratory: negative for cough or wheezing Urologic: negative for hematuria Abdominal: negative for nausea, vomiting, diarrhea, bright red blood per rectum, melena, or hematemesis Neurologic: negative for visual changes, syncope, or dizziness All other systems reviewed and are otherwise negative except as noted above.    Blood pressure 110/80, pulse 60, height 5\' 7"  (1.702 m), weight 194 lb (88 kg), last menstrual period 08/26/2018, SpO2 97 %.  General appearance: alert and no distress Neck: no adenopathy, no carotid bruit, no JVD, supple, symmetrical, trachea midline, and thyroid not enlarged, symmetric, no tenderness/mass/nodules Lungs: clear to auscultation bilaterally Heart: regular rate and rhythm, S1, S2 normal, no murmur, click, rub or gallop Extremities: extremities normal, atraumatic, no cyanosis or edema Pulses: 2+ and symmetric Skin: Skin color, texture, turgor normal. No rashes or lesions Neurologic: Grossly normal  EKG sinus rhythm at 60 without ST or T wave changes.   Personally reviewed this EKG.  ASSESSMENT AND PLAN:   Atypical chest pain Ms. Poteat was referred for atypical chest pain.  She was recently seen in the emergency room with chest pain with negative work-up.  She has no significant cardiac risk factors.  I am going to get a coronary CTA to further evaluate  Lower extremity edema Procedure has noticed bilateral lower extremity edema.  She was begun on oral diuretic by her primary care physician which has resulted in significant improvement.  She eats minimal salt.  We will check a 2D echo to further evaluate     Odis Luster MD [facility], [facility] 07/26/2021 10:36 AM

## 2021-07-26 NOTE — Assessment & Plan Note (Signed)
Diane [name] was referred for atypical chest pain.  She was recently seen in the emergency room with chest pain with negative work-up.  She has no significant cardiac risk factors.  I am going to get a coronary CTA to further evaluate

## 2021-08-01 ENCOUNTER — Telehealth (HOSPITAL_COMMUNITY): Payer: Self-pay | Admitting: *Deleted

## 2021-08-01 NOTE — Telephone Encounter (Signed)
Patient returning call regarding upcoming cardiac imaging study; pt states she is not feeling well and wishes to be rescheduled. Patient reschedule for Thursday, September 22 at 12:15pm.  Larey Brick RN Navigator Cardiac Imaging [facility] [phone] office [phone] cell

## 2021-08-01 NOTE — Telephone Encounter (Signed)
Attempted to call patient regarding upcoming cardiac CT appointment. °Left message on voicemail with name and callback number ° °Sheneika Walstad RN Navigator Cardiac Imaging °[facility] °[phone] Office °[phone] Cell ° °

## 2021-08-02 ENCOUNTER — Telehealth: Payer: Self-pay | Admitting: Cardiovascular Disease

## 2021-08-02 ENCOUNTER — Ambulatory Visit (HOSPITAL_COMMUNITY): Admission: RE | Admit: 2021-08-02 | Payer: Managed Care, Other (non HMO) | Source: Ambulatory Visit

## 2021-08-02 DIAGNOSIS — E785 Hyperlipidemia, unspecified: Secondary | ICD-10-CM

## 2021-08-02 MED ORDER — ATORVASTATIN CALCIUM 40 MG PO TABS: 40.0000 mg | ORAL_TABLET | Freq: Every day | ORAL | 3 refills | Status: DC

## 2021-08-02 NOTE — Telephone Encounter (Signed)
Patient was returning call. Please advise ?

## 2021-08-02 NOTE — Telephone Encounter (Signed)
Diane Gess, MD  07/27/2021 12:58 PM EDT     LDL 180. Begin atorva 40 mg and recheck FLP 3 months. Provide heart healthy diet    Spoke to patient, aware of results and recommendations.   Rx sent to pharmacy.  Lab slips and diet information mailed.      Patient also states her D-dimer was elevated at PCP, they will be calling her to schedule a CT scan to r/o PE.  Advised to update [place] with results.

## 2021-08-06 ENCOUNTER — Telehealth (HOSPITAL_COMMUNITY): Payer: Self-pay | Admitting: *Deleted

## 2021-08-06 NOTE — Telephone Encounter (Signed)
Attempted to call patient regarding upcoming cardiac CT appointment. °Left message on voicemail with name and callback number ° °Lamyra Malcolm RN Navigator Cardiac Imaging °[facility] °[phone] Office °[phone] Cell ° °

## 2021-08-08 ENCOUNTER — Other Ambulatory Visit: Payer: Self-pay

## 2021-08-08 ENCOUNTER — Ambulatory Visit (HOSPITAL_COMMUNITY)
Admission: RE | Admit: 2021-08-08 | Discharge: 2021-08-08 | Disposition: A | Discharge status: Home / Self Care | Payer: Managed Care, Other (non HMO) | Source: Ambulatory Visit | Attending: Cardiovascular Disease | Admitting: Cardiovascular Disease

## 2021-08-08 DIAGNOSIS — R072 Precordial pain: Secondary | ICD-10-CM | POA: Diagnosis not present

## 2021-08-08 MED ORDER — NITROGLYCERIN 0.4 MG SL SUBL
0.8000 mg | SUBLINGUAL_TABLET | Freq: Once | SUBLINGUAL | Status: AC
  Administered 2021-08-08: 0.8 mg via SUBLINGUAL

## 2021-08-08 MED ORDER — IOHEXOL 350 MG/ML SOLN
95.0000 mL | Freq: Once | INTRAVENOUS | Status: AC | PRN
  Administered 2021-08-08: 95 mL via INTRAVENOUS

## 2021-08-08 MED ORDER — NITROGLYCERIN 0.4 MG SL SUBL
SUBLINGUAL_TABLET | SUBLINGUAL | Status: AC
  Filled 2021-08-08: qty 2

## 2021-08-08 MED ORDER — METOPROLOL TARTRATE 5 MG/5ML IV SOLN
INTRAVENOUS | Status: AC
  Filled 2021-08-08: qty 10

## 2021-08-08 NOTE — Progress Notes (Signed)
CT scan completed. Tolerated well. D/C home ambulatory with mother. Awake and alert. In no distress

## 2021-08-16 ENCOUNTER — Other Ambulatory Visit (HOSPITAL_COMMUNITY): Payer: Managed Care, Other (non HMO)

## 2021-08-19 ENCOUNTER — Encounter (HOSPITAL_COMMUNITY): Payer: Self-pay | Admitting: Cardiovascular Disease

## 2021-08-30 ENCOUNTER — Telehealth (HOSPITAL_COMMUNITY): Payer: Self-pay | Admitting: Cardiovascular Disease

## 2021-08-30 NOTE — Telephone Encounter (Signed)
Just an FYI. We have made several attempts to contact this patient including sending a letter to schedule or reschedule their echocardiogram. We will be removing the patient from the echo WQ.    08/16/21 NO SHOWED-MAILED LETTER LBW    Thank you

## 2022-06-23 ENCOUNTER — Other Ambulatory Visit: Payer: Self-pay | Admitting: Cardiovascular Disease

## 2022-09-01 ENCOUNTER — Encounter (HOSPITAL_BASED_OUTPATIENT_CLINIC_OR_DEPARTMENT_OTHER): Payer: Self-pay

## 2022-09-01 ENCOUNTER — Other Ambulatory Visit: Payer: Self-pay

## 2022-09-01 ENCOUNTER — Emergency Department (HOSPITAL_BASED_OUTPATIENT_CLINIC_OR_DEPARTMENT_OTHER)
Admission: EM | Admit: 2022-09-01 | Discharge: 2022-09-01 | Disposition: A | Discharge status: Home / Self Care | Payer: BC Managed Care – PPO | Attending: Emergency Medicine | Admitting: Emergency Medicine

## 2022-09-01 DIAGNOSIS — Z20822 Contact with and (suspected) exposure to covid-19: Secondary | ICD-10-CM | POA: Insufficient documentation

## 2022-09-01 DIAGNOSIS — J101 Influenza due to other identified influenza virus with other respiratory manifestations: Secondary | ICD-10-CM | POA: Insufficient documentation

## 2022-09-01 DIAGNOSIS — R509 Fever, unspecified: Secondary | ICD-10-CM | POA: Diagnosis present

## 2022-09-01 LAB — RESP PANEL BY RT-PCR (FLU A&B, COVID) ARPGX2
Influenza A by PCR: POSITIVE — AB
Influenza B by PCR: NEGATIVE
SARS Coronavirus 2 by RT PCR: NEGATIVE

## 2022-09-01 LAB — GROUP A STREP BY PCR: Group A Strep by PCR: NOT DETECTED

## 2022-09-01 MED ORDER — ONDANSETRON 4 MG PO TBDP
4.0000 mg | ORAL_TABLET | Freq: Once | ORAL | Status: AC
  Administered 2022-09-01: 4 mg via ORAL
  Filled 2022-09-01: qty 1

## 2022-09-01 MED ORDER — HYDROCODONE-ACETAMINOPHEN 10-325 MG PO TABS: 1.0000 | ORAL_TABLET | Freq: Once | ORAL | Status: DC

## 2022-09-01 MED ORDER — ALBUTEROL SULFATE HFA 108 (90 BASE) MCG/ACT IN AERS
2.0000 | INHALATION_SPRAY | Freq: Once | RESPIRATORY_TRACT | Status: AC
  Administered 2022-09-01: 2 via RESPIRATORY_TRACT
  Filled 2022-09-01: qty 6.7

## 2022-09-01 MED ORDER — GUAIFENESIN 100 MG/5ML PO LIQD
15.0000 mL | Freq: Once | ORAL | Status: AC
  Administered 2022-09-01: 15 mL via ORAL
  Filled 2022-09-01: qty 20

## 2022-09-01 MED ORDER — ONDANSETRON HCL 4 MG PO TABS: 4.0000 mg | ORAL_TABLET | Freq: Four times a day (QID) | ORAL | 0 refills | Status: DC | PRN

## 2022-09-01 MED ORDER — HYDROCODONE-ACETAMINOPHEN 5-325 MG PO TABS: 2.0000 | ORAL_TABLET | Freq: Once | ORAL | Status: DC

## 2022-09-01 NOTE — Discharge Instructions (Addendum)
Use Zofran every 6 hours as needed for nausea and vomiting.  Increase oral hydration with Pedialyte or Gatorade if diarrhea continues.  Take Motrin 600-800 mg every 6 hours and/or Tylenol 650 mg every 6-8 hours for fevers, chills, body aches, headache. Rest and increase your vitamin C consumption (lemons, oranges, and/or vitamin supplements). Using inhaler every 4 hours as needed, 2 puffs, for wheezing shortness of breath.

## 2022-09-01 NOTE — ED Triage Notes (Signed)
Patient here POV from Home.  Endorses Sunday PM began to have URI Symptoms along with N/V/D. Family Member Ill with Influenza A.   Tylenol at 0930.   NAD noted during Triage. A&Ox4. GCS 15. Ambulatory.

## 2022-09-01 NOTE — ED Provider Notes (Signed)
[place] EMERGENCY DEPT Provider Note   CSN: [id number] Arrival date & time: 09/01/22  1031     History  Chief Complaint  Patient presents with   Fever    Diane [name] is a 41 y.o. female.  Pt is a 41 yo female presenting with cough, headache, nausea, vomiting, diarrhea x 24 hours. Low grade temperature on arrival. Has been taking home Norco prescriptions for headache. Exposure to daughter who tested positive for influenza.   The history is provided by the patient. No language interpreter was used.  Fever Associated symptoms: cough, diarrhea, nausea and vomiting   Associated symptoms: no chest pain, no chills, no dysuria, no ear pain, no rash and no sore throat        Home Medications Prior to Admission medications   Medication Sig Start Date End Date Taking? Authorizing Provider  ondansetron (ZOFRAN) 4 MG tablet Take 1 tablet (4 mg total) by mouth every 6 (six) hours as needed for nausea or vomiting. [id number]  Yes Campbell Stall P, DO  ADDERALL XR 20 MG 24 hr capsule Take 20 mg by mouth every morning. 07/09/18   [provider]  albuterol (PROVENTIL HFA;VENTOLIN HFA) 108 (90 Base) MCG/ACT inhaler Inhale 2 puffs into the lungs every 6 (six) hours as needed for wheezing or shortness of breath.     [provider]  ALPRAZolam Duanne Moron) 0.25 MG tablet SMARTSIG:0.5 Tablet(s) By Mouth PRN 07/17/21   [provider]  amitriptyline (ELAVIL) 10 MG tablet amitriptyline 10 mg tablet  Take 1 tablet by mouth at bedtime for post traumatic headaches    [provider]  atorvastatin (LIPITOR) 40 MG tablet Take 1 tablet (40 mg total) by mouth daily. 06/24/22 06/19/23  Lorretta Harp, MD  Bacillus Coagulans-Inulin (PROBIOTIC) 1-250 BILLION-MG CAPS     [provider]  Biotin 10 MG TABS     [provider]  buprenorphine (SUBUTEX) 2 MG SUBL SL tablet Place 2 mg under the tongue 2 (two) times daily as needed. 07/17/21   [provider]  celecoxib (CELEBREX) 50 MG capsule PLEASE SEE ATTACHED FOR DETAILED DIRECTIONS 05/30/21   [provider]  chlorpheniramine-HYDROcodone ([place]) 10-8 MG/5ML SUER Take by mouth. 03/16/19   [provider]  diclofenac sodium (VOLTAREN) 1 % GEL APPLY ONE APPLICATION FOUR TIMES DAILY AS NEEDED 11/05/18   [provider]  dicyclomine (BENTYL) 20 MG tablet TAKE ONE TABLET BY MOUTH 3 TIMES DAILY BEFORE MEALS Patient taking differently: Take 20 mg by mouth 4 (four) times daily -  before meals and at bedtime. 06/03/18   Ladene Artist, MD  diphenoxylate-atropine (LOMOTIL) 2.5-0.025 MG tablet Take 1 tablet by mouth 4 (four) times daily as needed. 06/24/21   [provider]  EMGALITY 120 MG/ML SOAJ Inject 120 mg into the skin every 30 (thirty) days. 08/02/18   [provider]  escitalopram (LEXAPRO) 10 MG tablet     [provider]  famotidine (PEPCID) 40 MG tablet Take 40 mg by mouth at bedtime. 07/16/21   [provider]  FLOVENT HFA 110 MCG/ACT inhaler Inhale 2 puffs into the lungs 2 (two) times daily. 04/16/21   [provider]  fluticasone (FLONASE) 50 MCG/ACT nasal spray Place into the nose.    [provider]  furosemide (LASIX) 20 MG tablet Take 20 mg by mouth daily. 07/19/21   [provider]  gabapentin (NEURONTIN) 300 MG capsule TAKE 2 CAPSULES BY MOUTH 3 TIMES DAILY Patient taking  differently: Take 600-1,200 mg by mouth See admin instructions. Take 600mg  by mouth in the morning and 1,200mg  in the evening 05/26/17   Laurey Morale, MD  glycopyrrolate (ROBINUL) 1 MG tablet Take 1 mg by mouth 2 (two) times daily. 07/15/21   [provider]  levocetirizine (XYZAL) 5 MG tablet Take 5 mg by mouth every evening. 11/23/18   [provider]  lidocaine (XYLOCAINE) 2 % solution Use as directed 15 mLs in the mouth or throat as needed for mouth pain.    [provider]  LINZESS 290 MCG  CAPS capsule Take 290 mcg by mouth daily. 07/19/21   [provider]  lubiprostone (AMITIZA) 24 MCG capsule Take by mouth. 06/13/21   [provider]  magic mouthwash SOLN Take 5 mLs by mouth 4 (four) times daily as needed for mouth pain.    [provider]  Magnesium 400 MG TABS Take 1,200 mg by mouth at bedtime.     [provider]  meclizine (ANTIVERT) 25 MG tablet Take 12.5 mg by mouth 3 (three) times daily as needed. 06/19/21   [provider]  Multiple Vitamins-Minerals (EQL CENTURY WOMENS) TABS     [provider]  naloxone [facility]) nasal spray 4 mg/0.1 mL SMARTSIG:Both Nares 02/16/21   [provider]  omeprazole (PRILOSEC) 40 MG capsule Take 40 mg by mouth daily. 06/06/21   [provider]  ondansetron (ZOFRAN ODT) 4 MG disintegrating tablet Take 1 tablet (4 mg total) by mouth every 8 (eight) hours as needed for nausea or vomiting. 07/13/20   Sherwood Gambler, MD  oxyCODONE (ROXICODONE) 15 MG immediate release tablet Take 15 mg by mouth 5 (five) times daily. 07/17/21   [provider]  pantoprazole (PROTONIX) 40 MG tablet TAKE ONE TABLET BY MOUTH TWICE DAILY Patient taking differently: Take 40 mg by mouth 2 (two) times daily. 11/26/17   Midge Minium, MD  Polyethylene Glycol 3350 (MIRALAX PO) Take by mouth at bedtime.    [provider]  promethazine (PHENERGAN) 25 MG tablet Take 1 tablet (25 mg total) by mouth every 8 (eight) hours as needed for nausea or vomiting. 08/14/17   Brunetta Jeans, PA-C  promethazine (PHENERGAN) 25 MG tablet Take by mouth. 10/28/19   [provider]  propranolol ER (INDERAL LA) 160 MG SR capsule Take by mouth. 06/06/21   [provider]  Rimegepant Sulfate (NURTEC) 75 MG TBDP Nurtec ODT 75 mg disintegrating tablet  TAKE ONE TABLET BY MOUTH AS NEEDED. one PER 24 hours 05/17/20   [provider]  selenium 200 MCG TABS tablet     [provider]   sucralfate (CARAFATE) 1 g tablet Take 1 g by mouth 4 (four) times daily. 06/06/21   [provider]  tiZANidine (ZANAFLEX) 4 MG tablet Take 4 mg by mouth every 6 (six) hours as needed for muscle spasms.  11/18/16   [provider]  UBRELVY 100 MG TABS Take by mouth. 07/19/21   [provider]  venlafaxine XR (EFFEXOR-XR) 150 MG 24 hr capsule Take 2 capsules (300 mg total) by mouth daily before breakfast. 11/06/16   Laurey Morale, MD  Vitamin A 2400 MCG (8000 UT) TABS     [provider]  Zinc 50 MG TABS     [provider]  zonisamide (ZONEGRAN) 25 MG capsule TAKE THREE CAPSULES BY MOUTH AT BEDTIME 03/16/19   [provider]      Allergies  Nickel, Nsaids, Oxybutynin, Adhesive [tape], Metoclopramide hcl, Prochlorperazine edisylate, and Sulfa antibiotics    Review of Systems   Review of Systems  Constitutional:  Positive for fever. Negative for chills.  HENT:  Negative for ear pain and sore throat.   Eyes:  Negative for pain and visual disturbance.  Respiratory:  Positive for cough. Negative for shortness of breath.   Cardiovascular:  Negative for chest pain and palpitations.  Gastrointestinal:  Positive for diarrhea, nausea and vomiting. Negative for abdominal pain.  Genitourinary:  Negative for dysuria and hematuria.  Musculoskeletal:  Negative for arthralgias and back pain.  Skin:  Negative for color change and rash.  Neurological:  Negative for seizures and syncope.  All other systems reviewed and are negative.   Physical Exam Updated Vital Signs BP (!) 159/88   Pulse 99   Temp 99.1 F (37.3 C)   Resp 17   Ht 5\' 7"  (1.702 m)   Wt 88 kg   LMP 08/26/2018   SpO2 99%   BMI 30.39 kg/m  Physical Exam Vitals and nursing note reviewed.  Constitutional:      General: She is not in acute distress.    Appearance: She is well-developed.  HENT:     Head: Normocephalic and atraumatic.  Eyes:     Conjunctiva/sclera: Conjunctivae  normal.  Cardiovascular:     Rate and Rhythm: Normal rate and regular rhythm.     Heart sounds: No murmur heard. Pulmonary:     Effort: Pulmonary effort is normal. No respiratory distress.     Breath sounds: Examination of the right-upper field reveals wheezing. Examination of the left-upper field reveals wheezing. Examination of the right-middle field reveals wheezing. Examination of the left-middle field reveals wheezing. Examination of the right-lower field reveals wheezing. Examination of the left-lower field reveals wheezing. Wheezing present.  Abdominal:     Palpations: Abdomen is soft.     Tenderness: There is no abdominal tenderness.  Musculoskeletal:        General: No swelling.     Cervical back: Neck supple.  Skin:    General: Skin is warm and dry.     Capillary Refill: Capillary refill takes less than 2 seconds.  Neurological:     Mental Status: She is alert.  Psychiatric:        Mood and Affect: Mood normal.     ED Results / Procedures / Treatments   Labs (all labs ordered are listed, but only abnormal results are displayed) Labs Reviewed  RESP PANEL BY RT-PCR (FLU A&B, COVID) ARPGX2 - Abnormal; Notable for the following components:      Result Value   Influenza A by PCR POSITIVE (*)    All other components within normal limits  GROUP A STREP BY PCR    EKG None  Radiology No results found.  Procedures Procedures    Medications Ordered in ED Medications  guaiFENesin (ROBITUSSIN) 100 MG/5ML liquid 15 mL (has no administration in time range)  ondansetron (ZOFRAN-ODT) disintegrating tablet 4 mg (has no administration in time range)  HYDROcodone-acetaminophen (NORCO/VICODIN) 5-325 MG per tablet 2 tablet (has no administration in time range)  albuterol (VENTOLIN HFA) 108 (90 Base) MCG/ACT inhaler 2 puff (2 puffs Inhalation Given 09/01/22 1513)    ED Course/ Medical Decision Making/ A&P                           Medical Decision Making Risk OTC  drugs. Prescription drug management.  41 yo female presenting with cough, headache, nausea, vomiting, diarrhea x 24 hours.  Patient is alert oriented x3, temperature of 99.1 F, with stable vital signs.  Physical exam demonstrates no clinical signs of dehydration.  Equal bilateral breath sounds with minimal expiratory wheezing.  Patient admits to history of vaping.  No cigarette use.  No history of asthma.  Albuterol given.  Also admits to nausea, vomiting, diarrhea.  Zofran given for nausea and vomiting.  Patient recommended to increase oral hydration after nausea is controlled.  No neck stiffness or rigidity.  Low suspicion meningitis.  No signs or symptoms of sepsis.  Positive for influenza A.  Commended for increase hydration, Gatorade or Pedialyte if diarrhea continues, Zofran for nausea and vomiting, and Motrin or Tylenol for control of fevers.  Prescription sent to pharmacy.  Recommend for close follow-up with primary care physician as needed.  Patient in no distress and overall condition improved here in the ED. Detailed discussions were had with the patient regarding current findings, and need for close f/u with PCP or on call doctor. The patient has been instructed to return immediately if the symptoms worsen in any way for re-evaluation. Patient verbalized understanding and is in agreement with current care plan. All questions answered prior to discharge.         Final Clinical Impression(s) / ED Diagnoses Final diagnoses:  Influenza A    Rx / DC Orders ED Discharge Orders          Ordered    ondansetron (ZOFRAN) 4 MG tablet  Every 6 hours PRN        09/01/22 1530              Lianne Cure, DO [id number]

## 2022-09-22 ENCOUNTER — Other Ambulatory Visit (HOSPITAL_COMMUNITY): Payer: Self-pay

## 2022-09-22 MED ORDER — WEGOVY 0.25 MG/0.5ML ~~LOC~~ SOAJ
0.2500 mg | SUBCUTANEOUS | 0 refills | Status: DC
  Filled 2022-09-22 – 2022-10-13 (×2): qty 2, 28d supply, fill #0

## 2022-09-29 ENCOUNTER — Ambulatory Visit (INDEPENDENT_AMBULATORY_CARE_PROVIDER_SITE_OTHER): Payer: BC Managed Care – PPO

## 2022-09-29 ENCOUNTER — Ambulatory Visit (INDEPENDENT_AMBULATORY_CARE_PROVIDER_SITE_OTHER): Payer: BC Managed Care – PPO | Admitting: Podiatry

## 2022-09-29 DIAGNOSIS — M79671 Pain in right foot: Secondary | ICD-10-CM

## 2022-09-29 DIAGNOSIS — S92401A Displaced unspecified fracture of right great toe, initial encounter for closed fracture: Secondary | ICD-10-CM | POA: Diagnosis not present

## 2022-09-29 NOTE — Progress Notes (Unsigned)
Subjective:   Patient ID: Diane [name], female   DOB: 41 y.o.   MRN: [id number]   HPI Chief Complaint  Patient presents with   Fracture    Fracture right hallux, started a month ago, patient fell on the right foot, seen by Delcie Roch, [place]: Cam [facility],  seeing pain management     Tripped over gate about 1 month ago.    ROS      Objective:  Physical Exam  ***     Assessment:  ***     Plan:  ***

## 2022-09-30 ENCOUNTER — Other Ambulatory Visit (HOSPITAL_COMMUNITY): Payer: Self-pay

## 2022-10-01 ENCOUNTER — Other Ambulatory Visit (HOSPITAL_COMMUNITY): Payer: Self-pay

## 2022-10-13 ENCOUNTER — Other Ambulatory Visit (HOSPITAL_COMMUNITY): Payer: Self-pay

## 2022-10-13 ENCOUNTER — Other Ambulatory Visit: Payer: Self-pay | Admitting: Podiatry

## 2022-10-13 DIAGNOSIS — M79671 Pain in right foot: Secondary | ICD-10-CM

## 2022-10-13 DIAGNOSIS — S92401A Displaced unspecified fracture of right great toe, initial encounter for closed fracture: Secondary | ICD-10-CM

## 2022-10-16 ENCOUNTER — Emergency Department (HOSPITAL_BASED_OUTPATIENT_CLINIC_OR_DEPARTMENT_OTHER)
Admission: EM | Admit: 2022-10-16 | Discharge: 2022-10-16 | Disposition: A | Discharge status: Home / Self Care | Payer: BC Managed Care – PPO | Attending: Emergency Medicine | Admitting: Emergency Medicine

## 2022-10-16 ENCOUNTER — Emergency Department (HOSPITAL_BASED_OUTPATIENT_CLINIC_OR_DEPARTMENT_OTHER): Payer: BC Managed Care – PPO

## 2022-10-16 ENCOUNTER — Other Ambulatory Visit: Payer: Self-pay

## 2022-10-16 ENCOUNTER — Encounter (HOSPITAL_BASED_OUTPATIENT_CLINIC_OR_DEPARTMENT_OTHER): Payer: Self-pay | Admitting: Emergency Medicine

## 2022-10-16 DIAGNOSIS — S52501A Unspecified fracture of the lower end of right radius, initial encounter for closed fracture: Secondary | ICD-10-CM

## 2022-10-16 DIAGNOSIS — Y92009 Unspecified place in unspecified non-institutional (private) residence as the place of occurrence of the external cause: Secondary | ICD-10-CM | POA: Insufficient documentation

## 2022-10-16 DIAGNOSIS — S6991XA Unspecified injury of right wrist, hand and finger(s), initial encounter: Secondary | ICD-10-CM | POA: Diagnosis present

## 2022-10-16 DIAGNOSIS — S52591A Other fractures of lower end of right radius, initial encounter for closed fracture: Secondary | ICD-10-CM | POA: Diagnosis not present

## 2022-10-16 DIAGNOSIS — W010XXA Fall on same level from slipping, tripping and stumbling without subsequent striking against object, initial encounter: Secondary | ICD-10-CM | POA: Diagnosis not present

## 2022-10-16 MED ORDER — OXYCODONE-ACETAMINOPHEN 5-325 MG PO TABS
1.0000 | ORAL_TABLET | Freq: Once | ORAL | Status: AC
  Administered 2022-10-16: 1 via ORAL
  Filled 2022-10-16: qty 1

## 2022-10-16 NOTE — ED Triage Notes (Signed)
Pt here from home with c/o right wrist pain and swelling after a fall last night tripping over her walking boot . Wrist is swollen and painful to touch

## 2022-10-16 NOTE — Discharge Instructions (Signed)
As we discussed, the end of your right radius is broken.  We have placed you in a splint for management of this today and recommend that you follow-up with orthopedics for continued evaluation and management of this.  I have given you a referral with a number to call to schedule an appointment.  In the interim, please rest, ice, compress, and elevate your wrist for symptomatic relief and take your home pain medication for management.  Please wear your splint at all times.  Return if development of any new or worsening symptoms.

## 2022-10-16 NOTE — ED Notes (Signed)
Discharge paperwork given and verbally understood. 

## 2022-10-16 NOTE — ED Provider Notes (Signed)
[facility] EMERGENCY DEPT Provider Note   CSN: [id number] Arrival date & time: 10/16/22  [id number]     History  Chief Complaint  Patient presents with   Wrist Pain    Diane [name] is a 41 y.o. female.  Patient with history of narcotic dependence presents today with complaints of right wrist pain. She states that yesterday she tripped and fell on her outstretched right hand and has had pain since. She did not hit her head or loose consciousness. Has been ambulatory since without difficulty. Denies fevers or chills. No other injuries or complaints.  The history is provided by the patient. No language interpreter was used.  Wrist Pain       Home Medications Prior to Admission medications   Medication Sig Start Date End Date Taking? Authorizing Provider  ADDERALL XR 20 MG 24 hr capsule Take 20 mg by mouth every morning. 07/09/18   [provider]  albuterol (PROVENTIL HFA;VENTOLIN HFA) 108 (90 Base) MCG/ACT inhaler Inhale 2 puffs into the lungs every 6 (six) hours as needed for wheezing or shortness of breath.     [provider]  ALPRAZolam Duanne Moron) 0.25 MG tablet SMARTSIG:0.5 Tablet(s) By Mouth PRN 07/17/21   [provider]  amitriptyline (ELAVIL) 10 MG tablet amitriptyline 10 mg tablet  Take 1 tablet by mouth at bedtime for post traumatic headaches    [provider]  atorvastatin (LIPITOR) 40 MG tablet Take 1 tablet (40 mg total) by mouth daily. 06/24/22 06/19/23  Lorretta Harp, MD  Bacillus Coagulans-Inulin (PROBIOTIC) 1-250 BILLION-MG CAPS     [provider]  Biotin 10 MG TABS     [provider]  buprenorphine (SUBUTEX) 2 MG SUBL SL tablet Place 2 mg under the tongue 2 (two) times daily as needed. 07/17/21   [provider]  celecoxib (CELEBREX) 50 MG capsule PLEASE SEE ATTACHED FOR DETAILED DIRECTIONS Patient not taking: Reported on 09/29/2022 05/30/21   [provider]   chlorpheniramine-HYDROcodone ([place]) 10-8 MG/5ML SUER Take by mouth. 03/16/19   [provider]  diclofenac sodium (VOLTAREN) 1 % GEL APPLY ONE APPLICATION FOUR TIMES DAILY AS NEEDED 11/05/18   [provider]  dicyclomine (BENTYL) 20 MG tablet TAKE ONE TABLET BY MOUTH 3 TIMES DAILY BEFORE MEALS Patient taking differently: Take 20 mg by mouth 4 (four) times daily -  before meals and at bedtime. 06/03/18   Ladene Artist, MD  diphenoxylate-atropine (LOMOTIL) 2.5-0.025 MG tablet Take 1 tablet by mouth 4 (four) times daily as needed. 06/24/21   [provider]  EMGALITY 120 MG/ML SOAJ Inject 120 mg into the skin every 30 (thirty) days. 08/02/18   [provider]  escitalopram (LEXAPRO) 10 MG tablet     [provider]  famotidine (PEPCID) 40 MG tablet Take 40 mg by mouth at bedtime. 07/16/21   [provider]  FLOVENT HFA 110 MCG/ACT inhaler Inhale 2 puffs into the lungs 2 (two) times daily. 04/16/21   [provider]  fluticasone (FLONASE) 50 MCG/ACT nasal spray Place into the nose.    [provider]  furosemide (LASIX) 20 MG tablet Take 20 mg by mouth daily. 07/19/21   [provider]  gabapentin (NEURONTIN) 300 MG capsule TAKE 2 CAPSULES BY MOUTH 3 TIMES DAILY Patient taking differently: Take 600-1,200 mg by mouth See admin instructions. Take 600mg  by mouth in the morning and 1,200mg  in the evening 05/26/17   Laurey Morale, MD  glycopyrrolate (ROBINUL) 1 MG  tablet Take 1 mg by mouth 2 (two) times daily. 07/15/21   [provider]  levocetirizine (XYZAL) 5 MG tablet Take 5 mg by mouth every evening. 11/23/18   [provider]  lidocaine (XYLOCAINE) 2 % solution Use as directed 15 mLs in the mouth or throat as needed for mouth pain.    [provider]  LINZESS 290 MCG CAPS capsule Take 290 mcg by mouth daily. 07/19/21   [provider]  lubiprostone (AMITIZA) 24 MCG capsule Take by mouth.  06/13/21   [provider]  magic mouthwash SOLN Take 5 mLs by mouth 4 (four) times daily as needed for mouth pain.    [provider]  Magnesium 400 MG TABS Take 1,200 mg by mouth at bedtime.     [provider]  meclizine (ANTIVERT) 25 MG tablet Take 12.5 mg by mouth 3 (three) times daily as needed. 06/19/21   [provider]  Multiple Vitamins-Minerals (EQL [facility] WOMENS) TABS     [provider]  naloxone [facility]) nasal spray 4 mg/0.1 mL SMARTSIG:Both Nares 02/16/21   [provider]  omeprazole (PRILOSEC) 40 MG capsule Take 40 mg by mouth daily. 06/06/21   [provider]  ondansetron (ZOFRAN ODT) 4 MG disintegrating tablet Take 1 tablet (4 mg total) by mouth every 8 (eight) hours as needed for nausea or vomiting. 07/13/20   Pricilla Loveless, MD  ondansetron (ZOFRAN) 4 MG tablet Take 1 tablet (4 mg total) by mouth every 6 (six) hours as needed for nausea or vomiting. 09/01/22   Edwin Dada P, DO  oxyCODONE (ROXICODONE) 15 MG immediate release tablet Take 15 mg by mouth 5 (five) times daily. 07/17/21   [provider]  pantoprazole (PROTONIX) 40 MG tablet TAKE ONE TABLET BY MOUTH TWICE DAILY Patient taking differently: Take 40 mg by mouth 2 (two) times daily. 11/26/17   Sheliah Hatch, MD  Polyethylene Glycol 3350 (MIRALAX PO) Take by mouth at bedtime.    [provider]  promethazine (PHENERGAN) 25 MG tablet Take 1 tablet (25 mg total) by mouth every 8 (eight) hours as needed for nausea or vomiting. 08/14/17   Waldon Merl, PA-C  promethazine (PHENERGAN) 25 MG tablet Take by mouth. 10/28/19   [provider]  propranolol ER (INDERAL LA) 160 MG SR capsule Take by mouth. 06/06/21   [provider]  Rimegepant Sulfate (NURTEC) 75 MG TBDP Nurtec ODT 75 mg disintegrating tablet  TAKE ONE TABLET BY MOUTH AS NEEDED. one PER 24 hours 05/17/20   [provider]  selenium 200 MCG TABS tablet      [provider]  Semaglutide-Weight Management (WEGOVY) 0.25 MG/0.5ML SOAJ Inject 0.25 mg into the skin once a week. 09/22/22     sucralfate (CARAFATE) 1 g tablet Take 1 g by mouth 4 (four) times daily. 06/06/21   [provider]  tiZANidine (ZANAFLEX) 4 MG tablet Take 4 mg by mouth every 6 (six) hours as needed for muscle spasms.  11/18/16   [provider]  UBRELVY 100 MG TABS Take by mouth. 07/19/21   [provider]  venlafaxine XR (EFFEXOR-XR) 150 MG 24 hr capsule Take 2 capsules (300 mg total) by mouth daily before breakfast. 11/06/16   Nelwyn Salisbury, MD  Vitamin A 2400 MCG (8000 UT) TABS     [provider]  Zinc 50 MG TABS     [provider]  zonisamide (ZONEGRAN) 25 MG capsule TAKE THREE CAPSULES BY  MOUTH AT BEDTIME 03/16/19   [provider]      Allergies    Nickel, Nsaids, Oxybutynin, Adhesive [tape], Metoclopramide hcl, Prochlorperazine edisylate, and Sulfa antibiotics    Review of Systems   Review of Systems  Musculoskeletal:  Positive for arthralgias.  All other systems reviewed and are negative.   Physical Exam Updated Vital Signs BP (!) 143/88 (BP Location: Left Arm)   Pulse (!) 101   Temp 99.1 F (37.3 C) (Oral)   Resp 16   LMP 08/26/2018   SpO2 99%  Physical Exam Vitals and nursing note reviewed.  Constitutional:      General: She is not in acute distress.    Appearance: Normal appearance. She is normal weight. She is not ill-appearing, toxic-appearing or diaphoretic.  HENT:     Head: Normocephalic and atraumatic.  Cardiovascular:     Rate and Rhythm: Normal rate.  Pulmonary:     Effort: Pulmonary effort is normal. No respiratory distress.  Musculoskeletal:        General: Normal range of motion.     Cervical back: Normal range of motion.     Comments: Tenderness to palpation of the right medial wrist. Associated bruising and swelling. Ice currently on the wrist. Radial and ulnar pulse intact and  2+. Capillary refill less than 2 seconds. Compartments soft. ROM limited due to pain.  Skin:    General: Skin is warm and dry.  Neurological:     General: No focal deficit present.     Mental Status: She is alert.  Psychiatric:        Mood and Affect: Mood normal.        Behavior: Behavior normal.     ED Results / Procedures / Treatments   Labs (all labs ordered are listed, but only abnormal results are displayed) Labs Reviewed - No data to display  EKG None  Radiology DG Wrist Complete Right  Result Date: 10/16/2022 CLINICAL DATA:  Wrist pain after falling. EXAM: RIGHT WRIST - COMPLETE 3+ VIEW COMPARISON:  None Available. FINDINGS: The mineralization and alignment are normal. There is subtle evidence of a nondisplaced fracture of the distal radius, best seen on the lateral view. Associated dorsal soft tissue swelling. No displacement of the articular surface. The distal ulna and carpal bones appear intact. No evidence dislocation or foreign body. IMPRESSION: Subtle nondisplaced fracture of the distal radius. Electronically Signed   By: Richardean Sale M.D.   On: 10/16/2022 10:38    Procedures Procedures    Medications Ordered in ED Medications  oxyCODONE-acetaminophen (PERCOCET/ROXICET) 5-325 MG per tablet 1 tablet (1 tablet Oral Given 10/16/22 1049)    ED Course/ Medical Decision Making/ A&P                           Medical Decision Making Amount and/or Complexity of Data Reviewed Radiology: ordered.  Risk Prescription drug management.   Patient presents today after fall with right wrist pain yesterday. She is afebrile, non-toxic appearing, and in no acute distress with reassuring vital signs. X-ray imaging obtained which reveals  Subtle nondisplaced fracture of the distal radius.   I have personally reviewed and interpreted this imaging and agree with radiology interpretation.   Patient given thumb spica splint for management of radius fracture. Given  orthopedic referral for follow-up. Patient is already on chronic daily narcotics, will recommend ice and NSAIDs for additional pain control. Patient understanding and amenable with plan, educated on red  flag symptoms that would prompt immediate return. Patient discharged in stable condition.  Findings and plan of care discussed with supervising physician Dr. Gilford Raid who is in agreement.   Final Clinical Impression(s) / ED Diagnoses Final diagnoses:  Closed fracture of distal end of right radius, unspecified fracture morphology, initial encounter    Rx / DC Orders ED Discharge Orders     None     An After Visit Summary was printed and given to the patient.     Nestor Lewandowsky 10/16/22 1104    Isla Pence, MD 10/16/22 1152

## 2022-10-20 ENCOUNTER — Ambulatory Visit: Payer: BC Managed Care – PPO | Admitting: Podiatry

## 2022-11-03 ENCOUNTER — Ambulatory Visit: Payer: BC Managed Care – PPO | Admitting: Podiatry

## 2023-04-30 ENCOUNTER — Telehealth: Payer: Self-pay | Admitting: Physician Assistant

## 2023-04-30 NOTE — Telephone Encounter (Signed)
Patient called in to establish care with Dr. Jon Billings as recommended by her mother. I informed patient that she has been dismissed from all [facility] effective 11/30/2017. Upon reading letter to pt, she state she remembered and stated she never received a call back in regards to this.   States she saw a PA at [facility] field location prior to dismissal in order to transfer care. States during visit, the PA began discussing the pain medications despite pt stating that wasn't the focus of the visit. Patient went on to state that she did inform the PA that she was having medication managed by her pain management provider @ the time. Patient would like this looked into for possible reversal.

## 2023-04-30 NOTE — Telephone Encounter (Signed)
I would not have a problem w/ her returning to the [facility] as a pt as long as Dr Jon Billings feels comfortable with it.  At the time, there was an issue of taking controlled substances from multiple providers.  But that was 6+ yrs ago and people/situations change.  I will leave the final decision to Dr Jon Billings

## 2023-05-06 NOTE — Telephone Encounter (Signed)
I have left patient a vm in regard to call me back.  Dr. Jon Billings will take patient on but will not be able to prescribe any controlled substances.

## 2023-05-08 NOTE — Telephone Encounter (Signed)
Pt returned call while supervisor out of office. Informed caller I would have supervisor call her back. Pt verbalized understanding.

## 2023-05-08 NOTE — Telephone Encounter (Signed)
Patient would like to know if provider would be able to manage adderall?

## 2023-05-14 NOTE — Telephone Encounter (Signed)
Patient is ok with Dr. Jon Billings not prescribing adderall.  I have removed dismissal. I have left patient a vm to call back to establish care with Dr. Jon Billings.

## 2023-06-17 ENCOUNTER — Encounter: Payer: Self-pay | Admitting: Internal Medicine

## 2023-06-17 ENCOUNTER — Other Ambulatory Visit: Payer: Self-pay | Admitting: Internal Medicine

## 2023-06-17 ENCOUNTER — Ambulatory Visit: Payer: BC Managed Care – PPO | Admitting: Internal Medicine

## 2023-06-17 ENCOUNTER — Ambulatory Visit (INDEPENDENT_AMBULATORY_CARE_PROVIDER_SITE_OTHER)
Admission: RE | Admit: 2023-06-17 | Discharge: 2023-06-17 | Disposition: A | Discharge status: Home / Self Care | Payer: BC Managed Care – PPO | Source: Ambulatory Visit | Attending: Internal Medicine | Admitting: Internal Medicine

## 2023-06-17 ENCOUNTER — Encounter (INDEPENDENT_AMBULATORY_CARE_PROVIDER_SITE_OTHER): Payer: Self-pay

## 2023-06-17 VITALS — BP 101/64 | HR 107 | Temp 98.5°F | Ht 67.0 in | Wt 224.8 lb

## 2023-06-17 DIAGNOSIS — R635 Abnormal weight gain: Secondary | ICD-10-CM

## 2023-06-17 DIAGNOSIS — M25561 Pain in right knee: Secondary | ICD-10-CM

## 2023-06-17 DIAGNOSIS — M545 Low back pain, unspecified: Secondary | ICD-10-CM | POA: Diagnosis not present

## 2023-06-17 DIAGNOSIS — R1313 Dysphagia, pharyngeal phase: Secondary | ICD-10-CM | POA: Diagnosis not present

## 2023-06-17 DIAGNOSIS — G8929 Other chronic pain: Secondary | ICD-10-CM

## 2023-06-17 DIAGNOSIS — F411 Generalized anxiety disorder: Secondary | ICD-10-CM | POA: Diagnosis not present

## 2023-06-17 DIAGNOSIS — K5909 Other constipation: Secondary | ICD-10-CM

## 2023-06-17 DIAGNOSIS — Z119 Encounter for screening for infectious and parasitic diseases, unspecified: Secondary | ICD-10-CM

## 2023-06-17 DIAGNOSIS — R52 Pain, unspecified: Secondary | ICD-10-CM

## 2023-06-17 DIAGNOSIS — G4733 Obstructive sleep apnea (adult) (pediatric): Secondary | ICD-10-CM

## 2023-06-17 DIAGNOSIS — E669 Obesity, unspecified: Secondary | ICD-10-CM | POA: Diagnosis not present

## 2023-06-17 DIAGNOSIS — M4126 Other idiopathic scoliosis, lumbar region: Secondary | ICD-10-CM

## 2023-06-17 DIAGNOSIS — F322 Major depressive disorder, single episode, severe without psychotic features: Secondary | ICD-10-CM

## 2023-06-17 DIAGNOSIS — S82891S Other fracture of right lower leg, sequela: Secondary | ICD-10-CM

## 2023-06-17 DIAGNOSIS — F32A Depression, unspecified: Secondary | ICD-10-CM

## 2023-06-17 DIAGNOSIS — G43709 Chronic migraine without aura, not intractable, without status migrainosus: Secondary | ICD-10-CM

## 2023-06-17 DIAGNOSIS — F1191 Opioid use, unspecified, in remission: Secondary | ICD-10-CM | POA: Diagnosis not present

## 2023-06-17 LAB — LIPID PANEL
Cholesterol: 157 mg/dL (ref 0–200)
HDL: 37.2 mg/dL — ABNORMAL LOW (ref >39.00)
LDL Cholesterol: 83 mg/dL (ref 0–99)
NonHDL: 120.15
Total CHOL/HDL Ratio: 4
Triglycerides: 184 mg/dL — ABNORMAL HIGH (ref 0.0–149.0)
VLDL: 36.8 mg/dL (ref 0.0–40.0)

## 2023-06-17 LAB — COMPREHENSIVE METABOLIC PANEL
ALT: 27 U/L (ref 0–35)
AST: 30 U/L (ref 0–37)
Albumin: 4.3 g/dL (ref 3.5–5.2)
Alkaline Phosphatase: 119 U/L — ABNORMAL HIGH (ref 39–117)
BUN: 13 mg/dL (ref 6–23)
CO2: 26 mEq/L (ref 19–32)
Calcium: 9.4 mg/dL (ref 8.4–10.5)
Chloride: 104 mEq/L (ref 96–112)
Creatinine, Ser: 0.84 mg/dL (ref 0.40–1.20)
GFR: 86.68 mL/min (ref >60.00)
Glucose, Bld: 89 mg/dL (ref 70–99)
Potassium: 4.3 mEq/L (ref 3.5–5.1)
Sodium: 138 mEq/L (ref 135–145)
Total Bilirubin: 0.3 mg/dL (ref 0.2–1.2)
Total Protein: 7.8 g/dL (ref 6.0–8.3)

## 2023-06-17 LAB — CBC
HCT: 37.6 % (ref 36.0–46.0)
Hemoglobin: 12.3 g/dL (ref 12.0–15.0)
MCHC: 32.6 g/dL (ref 30.0–36.0)
MCV: 83.7 fl (ref 78.0–100.0)
Platelets: 300 10*3/uL (ref 150.0–400.0)
RBC: 4.5 Mil/uL (ref 3.87–5.11)
RDW: 14.1 % (ref 11.5–15.5)
WBC: 6 10*3/uL (ref 4.0–10.5)

## 2023-06-17 LAB — TSH: TSH: 1.62 u[IU]/mL (ref 0.35–5.50)

## 2023-06-17 MED ORDER — SEMAGLUTIDE-WEIGHT MANAGEMENT 0.25 MG/0.5ML ~~LOC~~ SOAJ: 0.2500 mg | SUBCUTANEOUS | 0 refills | Status: AC

## 2023-06-17 MED ORDER — SEMAGLUTIDE-WEIGHT MANAGEMENT 1.7 MG/0.75ML ~~LOC~~ SOAJ: 1.7000 mg | SUBCUTANEOUS | 0 refills | Status: AC

## 2023-06-17 MED ORDER — SEMAGLUTIDE-WEIGHT MANAGEMENT 2.4 MG/0.75ML ~~LOC~~ SOAJ: 2.4000 mg | SUBCUTANEOUS | 0 refills | Status: AC

## 2023-06-17 MED ORDER — SEMAGLUTIDE-WEIGHT MANAGEMENT 1 MG/0.5ML ~~LOC~~ SOAJ: 1.0000 mg | SUBCUTANEOUS | 0 refills | Status: AC

## 2023-06-17 MED ORDER — SEMAGLUTIDE-WEIGHT MANAGEMENT 0.5 MG/0.5ML ~~LOC~~ SOAJ: 0.5000 mg | SUBCUTANEOUS | 0 refills | Status: AC

## 2023-06-17 NOTE — Assessment & Plan Note (Deleted)
Dysphagia: They have a history of esophageal dilation and currently experience food getting stuck. We will refer them to GI for evaluation and possible repeat dilation.

## 2023-06-17 NOTE — Assessment & Plan Note (Deleted)
Chronic Pain: They have a history of ankle fracture and knee surgery, with current pain in the right ankle, right knee, and lower back, and mild degenerative disc disease. We will continue Tramadol as needed and consider transitioning to Suboxone for more effective pain management.

## 2023-06-17 NOTE — Assessment & Plan Note (Deleted)
Sleep Apnea: They have a history of CPAP use, but a recent sleep study suggested it is no longer needed. We will consider using the [employer] app at home to monitor for intermittent sleep apnea.

## 2023-06-17 NOTE — Assessment & Plan Note (Deleted)
Obesity: They noted weight gain after turning 41 and are currently attempting weight loss with ZOXWRU. We will continue their current regimen and monitor progress. They are getting the medication(s) by using compounding pharmacy, risks discussed. We will try to get prior authorization for more reliable/trustable source.

## 2023-06-17 NOTE — Assessment & Plan Note (Deleted)
Mild Scoliosis: Noted on imaging from 2014. We will order updated imaging to assess the current status.

## 2023-06-17 NOTE — Assessment & Plan Note (Deleted)
Chronic Constipation: They have not had a bowel movement in 5 days despite taking stool softeners, possibly due to [facility]. We will continue their current regimen and monitor the response.

## 2023-06-17 NOTE — Assessment & Plan Note (Deleted)
Opioid Use Disorder in Remission: They have a history of opioid addiction and rehab, currently managing chronic pain with Tramadol. We will consider transitioning them to Suboxone for pain management, acknowledging the potential loss of Tramadol and Xanax prescriptions.

## 2023-06-17 NOTE — Assessment & Plan Note (Deleted)
Migraine without Aura: They have a longstanding history of migraines, currently well-controlled with Emgality injections and Nurtec/Emerge for rescue. We will continue their current regimen.

## 2023-06-17 NOTE — Progress Notes (Signed)
[facility]: [phone]   Routine Medical Office Visit  Patient:  Diane [name]      Age: 42 y.o.       Sex:  female  Date:   06/17/2023 Patient Care Team: Lula Olszewski, MD as PCP - General (Internal Medicine) Olivia Mackie, MD (Obstetrics and Gynecology) Annia Belt, MD as Referring Physician (Psychiatry) Today's Healthcare Provider: Lula Olszewski, MD   Assessment and Plan:   Deshawna was seen today for new patient (initial visit), gi problem and inability to swallow.  Pharyngeal dysphagia Overview: History esophageal dilation Was following with gastrointestinal in [place] but shifting to [place]. Past GI History:  Colonoscopy/[facility]/2020--normal mucosa in the whole colon. 3 mm polyp in the cecum. Internal hemorrhoids. EGD/[facility]/2020--normal duodenum, food in the lower third of the esophagus, food in the stomach body. 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 We will refer them to GI for evaluation and possible repeat dilation.  Orders: -     Ambulatory referral to Gastroenterology  Opioid use disorder in remission Overview: Had chronic pain going back to 2000s related with injuries, later developing opioid use disorder and going to rehabilitation.  Assessment & Plan: Opioid Use Disorder in Remission: They have a history of opioid addiction and rehab, currently managing chronic pain with Tramadol. We will consider transitioning them to Suboxone for pain management, acknowledging the potential loss of Tramadol and Xanax prescriptions.   Intractable pain Overview: Right ankle, right knee, lower back.  On tramadol due to history of opioid use disorder, was on oxycodone but per patient report Dr. Malen Gauze her pain management (specialist) won't be able to do stronger than  tramadol due to opioid use disorder history.  Assessment & Plan: Chronic Pain: They have a history of ankle fracture and knee surgery, with current pain in the right ankle, right knee, and lower back, and mild degenerative disc disease. We will continue Tramadol as needed and consider transitioning to Suboxone for more effective pain management.   Closed fracture of right ankle, sequela Overview: Persistent pain history of nickel implant removed, may need transplant eventually Hasn't see her orthopedist at [facility] for more than 5 years   Anxiety state Overview:    06/17/2023   10:20 AM  GAD 7 : Generalized Anxiety Score  Nervous, Anxious, on Edge 1  Control/stop worrying 0  Worry too much - different things 0  Trouble relaxing 1  Restless 0  Easily annoyed or irritable 2  Afraid - awful might happen 0  Total GAD 7 Score 4  Anxiety Difficulty Somewhat difficult    Assessment & Plan: Taking alprazolam from Dr. Malen Gauze, 0.25 mg with [facility]    Anxiety: They have mild anxiety, currently managed with Alprazolam 0.25mg  as needed. We will consider discontinuing Alprazolam due to potential interaction with Suboxone. She is getting buprenorphine from Dr. Malen Gauze so will continue(s) with that.    WEIGHT GAIN Overview: Gained 30 pounds after age 58   Orders: -     TSH  OSA (obstructive sleep apnea) Overview: Patient reports recent sleep study showed she did not need anymore so she is not using. Auto CPAP 04/12/17 to 05/11/17 >> used on 6 of 30 nights with average 3 hrs 58 min.  Average AHI 4 with median CPAP 9 and 95 th percentile CPAP 12 cm H2O.  Assessment & Plan: Sleep Apnea:  They have a history of CPAP use, but a recent sleep study suggested it is no longer needed. We will consider using the [employer] app at home to monitor for intermittent sleep apnea.   Chronic depression Overview:    06/17/2023   10:20 AM 08/12/2017    2:45 PM 04/06/2017   11:27 AM  PHQ9 SCORE ONLY   PHQ-9 Total Score 7 0 0  Takes venlafaxine 150  Assessment & Plan: Stable Well-controlled long term with Effexor, continue Discussed suboxone might help, but has to dc xanax.  Will do just a few days at first and confirm xanax discussion with serial urine drug screen. Depression: They have a longstanding history of depression, currently managed with Venlafaxine 150mg  daily. We will continue Venlafaxine and monitor for potential additional benefit from Suboxone.  Orders: -     CBC -     Comprehensive metabolic panel  Chronic migraine without aura without status migrainosus, not intractable Overview: Monthly emgality, has Nurtec and amerge controlling well as of 06/17/23  Assessment & Plan: Migraine without Aura: They have a longstanding history of migraines, currently well-controlled with Emgality injections and Nurtec/Emerge for rescue. We will continue their current regimen.   Chronic bilateral low back pain without sciatica Overview: Doesn't know why it chronically hurts, associated with mild degenerative disk disease  Mild scoliosis lumbar spine convex to the left.   No fracture detected. No significant disc space narrowing. No pars  defect identified.    Orders: -     DG Lumbar Spine Complete; Future  Obesity (BMI 30-39.9) Assessment & Plan: Obesity: They noted weight gain after turning 41 and are currently attempting weight loss with VZDGLO. We will continue their current regimen and monitor progress. They are getting the medication(s) by using compounding pharmacy, risks discussed. We will try to get prior authorization for more reliable/trustable source. Orders: -     Lipid panel  Chronic pain of right knee -     DG Knee 1-2 Views Right; Future  Screening examination for infectious disease -     Hepatitis C antibody -     HIV Antibody (routine testing w rflx)  Other idiopathic scoliosis, lumbar region Assessment & Plan: Mild Scoliosis: Noted on imaging from  2014. We will order updated imaging to assess the current status.  Chronic constipation Assessment & Plan: Chronic Constipation: They have not had a bowel movement in 5 days despite taking stool softeners, possibly due to [facility]. We will continue their current regimen and monitor the response.  Other orders -     Semaglutide-Weight Management; Inject 0.25 mg into the skin once a week for 28 days.  Dispense: 2 mL; Refill: 0 -     Semaglutide-Weight Management; Inject 0.5 mg into the skin once a week for 28 days.  Dispense: 2 mL; Refill: 0 -     Semaglutide-Weight Management; Inject 1 mg into the skin once a week for 28 days.  Dispense: 2 mL; Refill: 0 -     Semaglutide-Weight Management; Inject 1.7 mg into the skin once a week for 28 days.  Dispense: 3 mL; Refill: 0 -     Semaglutide-Weight Management; Inject 2.4 mg into the skin once a week for 28 days.  Dispense: 3 mL; Refill: 0     History of Gastric Ulcers: They have a history of gastric ulcers but are currently asymptomatic. We will monitor for recurrence of symptoms.      Orders  Ordered    Semaglutide-Weight Management 2.4 MG/0.75ML SOAJ  Weekly        06/17/23 1157    Semaglutide-Weight Management 1.7 MG/0.75ML SOAJ  Weekly        06/17/23 1157    Semaglutide-Weight Management 1 MG/0.5ML SOAJ  Weekly        06/17/23 1157    Semaglutide-Weight Management 0.5 MG/0.5ML SOAJ  Weekly        06/17/23 1157    Hepatitis C Antibody        06/17/23 1150    CBC        06/17/23 1150    Comp Met (CMET)        06/17/23 1150    Lipid panel        06/17/23 1150    HIV antibody (with reflex)        06/17/23 1150    Ambulatory referral to Gastroenterology       Comments: Dysphagia and abdominal discomfort, used to follow in [place] but wants to be in [place] now. Abdominal discomfort on wegovy   06/17/23 1150    DG Ankle Complete Left  Status:  Canceled        06/17/23 1150    TSH        06/17/23 1154     Semaglutide-Weight Management 0.25 MG/0.5ML SOAJ  Weekly        06/17/23 1157    DG Lumbar Spine Complete        06/17/23 1150    DG Knee 1-2 Views Right        06/17/23 1150            Recommended follow up: Return in about 3 months (around 09/17/2023) for chronic disease monitoring and management.  Future Appointments  Date Time Provider Department Center  07/13/2023  1:20 PM Lula Olszewski, MD [facility]           Clinical Presentation:    42 y.o. female who has DEPRESSION, ACUTE; Anxiety state; Opioid use disorder in remission; BACK PAIN; WEIGHT GAIN; Chronic pain of right knee; Ankle fracture; OSA (obstructive sleep apnea); Chronic migraine without aura; Osteochondral lesion; Dysphagia; Intractable pain; Chronic constipation; Other idiopathic scoliosis, lumbar region; and Obesity (BMI 30-39.9) on their problem list. Her reasons/main concerns/chief complaints for today's office visit are New Patient (Initial Visit), GI Problem (Stomach gets tight, bloated, full and gassy. No bowel movement for five days.), and Inability to swallow (Has had throat stretched previously and needs it periodically, food gets lodged in throat.)   AI-Extracted: Discussed the use of AI scribe software for clinical note transcription with the patient, who gave verbal consent to proceed.  History of Present Illness   The patient, with a history of opioid use disorder, presents with a primary complaint of dysphagia, or difficulty swallowing, which they report has been previously addressed by esophageal stretching. They express a desire for a new gastroenterologist and report concurrent abdominal discomfort, including constipation despite taking stool softeners.  The patient also reports chronic pain in the right ankle, right knee, and lower back. The ankle pain is a residual effect of a past fracture, which required surgical intervention with a nickel alloy implant. The implant was later removed due to a  nickel allergy. The patient was informed that an ankle transplant may be necessary in the future. The knee pain is associated with a suspected meniscus issue, and the lower back pain is attributed to mild degenerative disc disease.  The patient  has a history of migraines, which have significantly improved with the use of Emgality injections and rescue medications. They report a reduction from 80% of the month experiencing migraines to approximately one migraine per month.  The patient has a history of depression, currently managed with a high dose of venlafaxine. They report that the medication seems to be working better since the addition of a new medication.  The patient has a past medical history of a partial hysterectomy due to dysmenorrhea, or abnormal uterine bleeding. They also report a history of gastric ulcers, overactive bladder following childbirth, and lower extremity swelling post-surgery.  The patient has a history of opioid use disorder and reports past rehabilitation for the same. They are currently managing their chronic pain with tramadol, prescribed by their previous doctor. They also report taking a low dose of alprazolam for mild anxiety.  The patient has recently started on Wegovy for weight management and reports a weight gain of over 30 pounds since turning 41. They also report a history of mild scoliosis and a family history of various cancers.      She  has a past medical history of ADD (attention deficit disorder with hyperactivity), Allergy, Anxiety, Atypical chest pain (07/26/2021), Benign intracranial hypertension (06/16/2008), Choking, Cholelithiasis and acute cholecystitis without obstruction (12/02/2016), Chronic cough (12/27/2013), Depression, Dry mouth (08/12/2017), Dysmenorrhea, Family history of adverse reaction to anesthesia, Gastric ulcer (03/16/2015), GERD (gastroesophageal reflux disease), Headache (07/09/2007), History of gastric ulcer, History of kidney stones,  Hyperlipidemia, IBS (irritable bowel syndrome), Lower extremity edema (07/26/2021), Migraines, Mild obstructive sleep apnea, Nausea and vomiting (12/03/2016), OAB (overactive bladder) (08/12/2017), Osteoarthritis, Osteochondral lesion (09/2016), 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 Current Outpatient Medications on File Prior to Visit  Medication Sig   ADDERALL XR 20 MG 24 hr capsule Take 20 mg by mouth every morning.   albuterol (PROVENTIL HFA;VENTOLIN HFA) 108 (90 Base) MCG/ACT inhaler Inhale 2 puffs into the lungs every 6 (six) hours as needed for wheezing or shortness of breath.    ALPRAZolam (XANAX) 0.25 MG tablet SMARTSIG:0.5 Tablet(s) By Mouth PRN   atorvastatin (LIPITOR) 40 MG tablet Take 1 tablet (40 mg total) by mouth daily.   Bacillus Coagulans-Inulin (PROBIOTIC) 1-250 BILLION-MG CAPS    beclomethasone (QVAR) 40 MCG/ACT inhaler Inhale into the lungs.   Biotin 10 MG TABS    cetirizine (ZYRTEC) 10 MG tablet Take by mouth.   cyclobenzaprine (FLEXERIL) 10 MG tablet Take 1 tablet by mouth 3 (three) times daily.   diclofenac sodium (VOLTAREN) 1 % GEL APPLY ONE APPLICATION FOUR TIMES DAILY AS NEEDED   EMGALITY 120 MG/ML SOAJ Inject 120 mg into the skin every 30 (thirty) days.   fluticasone (FLONASE) 50 MCG/ACT nasal spray Place into the nose.   gabapentin (NEURONTIN) 300 MG capsule TAKE 2 CAPSULES BY MOUTH 3 TIMES DAILY (Patient taking differently: Take 600-1,200 mg by mouth See admin instructions. Take 600mg  by mouth in the morning and 1,200mg  in the evening)   levocetirizine (XYZAL) 5 MG tablet Take 5 mg by mouth every evening.   LINZESS 290 MCG CAPS capsule Take  290 mcg by mouth daily.   Magnesium 400 MG TABS Take 1,200 mg by mouth at bedtime.    magnesium oxide (MAG-OX) 400 MG tablet Take by mouth.  meclizine (ANTIVERT) 25 MG tablet Take 12.5 mg by mouth 3 (three) times daily as needed.   mirtazapine (REMERON) 30 MG tablet Take 30 mg by mouth daily.   Multiple Vitamins-Minerals (EQL CENTURY WOMENS) TABS    omeprazole (PRILOSEC) 40 MG capsule Take 40 mg by mouth daily.   ondansetron (ZOFRAN) 4 MG tablet Take 1 tablet (4 mg total) by mouth every 6 (six) hours as needed for nausea or vomiting.   pantoprazole (PROTONIX) 20 MG tablet Take 20 mg by mouth 2 (two) times daily.   Polyethylene Glycol 3350 (MIRALAX PO) Take by mouth at bedtime.   promethazine (PHENERGAN) 25 MG tablet Take 1 tablet (25 mg total) by mouth every 8 (eight) hours as needed for nausea or vomiting.   Rimegepant Sulfate (NURTEC) 75 MG TBDP Nurtec ODT 75 mg disintegrating tablet  TAKE ONE TABLET BY MOUTH AS NEEDED. one PER 24 hours   Semaglutide-Weight Management (WEGOVY) 0.25 MG/0.5ML SOAJ Inject 0.25 mg into the skin once a week.   tiZANidine (ZANAFLEX) 4 MG tablet Take 4 mg by mouth every 6 (six) hours as needed for muscle spasms.    traMADol (ULTRAM) 50 MG tablet Take 50 mg by mouth 4 (four) times daily as needed.   UBRELVY 100 MG TABS Take by mouth.   venlafaxine XR (EFFEXOR-XR) 150 MG 24 hr capsule Take 2 capsules (300 mg total) by mouth daily before breakfast.   Vitamin A 2400 MCG (8000 UT) TABS    Zinc 50 MG TABS    zonisamide (ZONEGRAN) 100 MG capsule Take 100 mg by mouth at bedtime.   No current facility-administered medications on file prior to visit.  These were discontinued as part of an extensive medication(s) recommend process. Medications Discontinued During This Encounter  Medication Reason   pantoprazole (PROTONIX) 40 MG tablet    dicyclomine (BENTYL) 20 MG tablet    magic mouthwash SOLN    lidocaine (XYLOCAINE) 2 % solution    chlorpheniramine-HYDROcodone  (TUSSIONEX) 10-8 MG/5ML SUER    ondansetron (ZOFRAN ODT) 4 MG disintegrating tablet    amitriptyline (ELAVIL) 10 MG tablet    buprenorphine (SUBUTEX) 2 MG SUBL SL tablet    diphenoxylate-atropine (LOMOTIL) 2.5-0.025 MG tablet    escitalopram (LEXAPRO) 10 MG tablet    famotidine (PEPCID) 40 MG tablet    FLOVENT HFA 110 MCG/ACT inhaler    furosemide (LASIX) 20 MG tablet    glycopyrrolate (ROBINUL) 1 MG tablet    lubiprostone (AMITIZA) 24 MCG capsule    naloxone (NARCAN) nasal spray 4 mg/0.1 mL    oxyCODONE (ROXICODONE) 15 MG immediate release tablet    promethazine (PHENERGAN) 25 MG tablet    propranolol ER (INDERAL LA) 160 MG SR capsule    selenium 200 MCG TABS tablet    sucralfate (CARAFATE) 1 g tablet    oxyCODONE-acetaminophen (PERCOCET) 7.5-325 MG tablet    topiramate (TOPAMAX) 50 MG tablet        Clinical Data Analysis:   Physical Exam  BP 101/64 (BP Location: Left Arm, Patient Position: Sitting)   Pulse (!) 107   Temp 98.5 F (36.9 C) (Temporal)   Ht 5\' 7"  (1.702 m)   Wt 224 lb 12.8 oz (102 kg)   LMP 08/26/2018   SpO2 98%   BMI 35.21 kg/m  Wt Readings from Last 10 Encounters:  06/17/23 224 lb 12.8 oz (102 kg)  09/01/22 194 lb 0.1 oz (88 kg)  07/26/21 194 lb (88 kg)  08/10/19 200 lb (90.7 kg)  04/15/19  209 lb 7 oz (95 kg)  04/05/19 203 lb 4.2 oz (92.2 kg)  04/05/19 210 lb (95.3 kg)  03/18/19 210 lb (95.3 kg)  12/01/18 212 lb (96.2 kg)  08/27/18 213 lb 6 oz (96.8 kg)   Vital signs reviewed.  Nursing notes reviewed. Weight trend reviewed. Abnormalities and Problem-Specific physical exam findings:  truncal adiposity, very thick lustrous blond hair. Excellent hygiene General Appearance:  No acute distress appreciable.   Well-groomed, healthy-appearing female.  Well proportioned with no abnormal fat distribution.  Good muscle tone. Skin: Clear and well-hydrated. Pulmonary:  Normal work of breathing at rest, no respiratory distress apparent. SpO2: 98 %   Musculoskeletal: All extremities are intact.  Neurological:  Awake, alert, oriented, and engaged.  No obvious focal neurological deficits or cognitive impairments.  Sensorium seems unclouded.   Speech is clear and coherent with logical content. Psychiatric:  Appropriate mood, pleasant and cooperative demeanor, thoughtful and engaged during the exam  Results Reviewed:      Results for orders placed or performed in visit on 06/17/23  CBC  Result Value Ref Range   WBC 6.0 4.0 - 10.5 K/uL   RBC 4.50 3.87 - 5.11 Mil/uL   Platelets 300.0 150.0 - 400.0 K/uL   Hemoglobin 12.3 12.0 - 15.0 g/dL   HCT 09.8 11.9 - 14.7 %   MCV 83.7 78.0 - 100.0 fl   MCHC 32.6 30.0 - 36.0 g/dL   RDW 82.9 56.2 - 13.0 %  Comp Met (CMET)  Result Value Ref Range   Sodium 138 135 - 145 mEq/L   Potassium 4.3 3.5 - 5.1 mEq/L   Chloride 104 96 - 112 mEq/L   CO2 26 19 - 32 mEq/L   Glucose, Bld 89 70 - 99 mg/dL   BUN 13 6 - 23 mg/dL   Creatinine, Ser 8.65 0.40 - 1.20 mg/dL   Total Bilirubin 0.3 0.2 - 1.2 mg/dL   Alkaline Phosphatase 119 (H) 39 - 117 U/L   AST 30 0 - 37 U/L   ALT 27 0 - 35 U/L   Total Protein 7.8 6.0 - 8.3 g/dL   Albumin 4.3 3.5 - 5.2 g/dL   GFR 78.46 >96.29 mL/min   Calcium 9.4 8.4 - 10.5 mg/dL  Lipid panel  Result Value Ref Range   Cholesterol 157 0 - 200 mg/dL   Triglycerides 528.4 (H) 0.0 - 149.0 mg/dL   HDL 13.24 (L) >40.10 mg/dL   VLDL 27.2 0.0 - 53.6 mg/dL   LDL Cholesterol 83 0 - 99 mg/dL   Total CHOL/HDL Ratio 4    NonHDL 120.15   TSH  Result Value Ref Range   TSH 1.62 0.35 - 5.50 uIU/mL    Office Visit on 06/17/2023  Component Date Value   WBC 06/17/2023 6.0    RBC 06/17/2023 4.50    Platelets 06/17/2023 300.0    Hemoglobin 06/17/2023 12.3    HCT 06/17/2023 37.6    MCV 06/17/2023 83.7    MCHC 06/17/2023 32.6    RDW 06/17/2023 14.1    Sodium 06/17/2023 138    Potassium 06/17/2023 4.3    Chloride 06/17/2023 104    CO2 06/17/2023 26    Glucose, Bld 06/17/2023 89     BUN 06/17/2023 13    Creatinine, Ser 06/17/2023 0.84    Total Bilirubin 06/17/2023 0.3    Alkaline Phosphatase 06/17/2023 119 (H)    AST 06/17/2023 30    ALT 06/17/2023 27    Total Protein 06/17/2023 7.8  Albumin 06/17/2023 4.3    GFR 06/17/2023 86.68    Calcium 06/17/2023 9.4    Cholesterol 06/17/2023 157    Triglycerides 06/17/2023 184.0 (H)    HDL 06/17/2023 37.20 (L)    VLDL 06/17/2023 36.8    LDL Cholesterol 06/17/2023 83    Total CHOL/HDL Ratio 06/17/2023 4    NonHDL 06/17/2023 120.15    TSH 06/17/2023 1.62   Admission on 09/01/2022, Discharged on 09/01/2022  Component Date Value   SARS Coronavirus 2 by RT* 09/01/2022 NEGATIVE    Influenza A by PCR 09/01/2022 POSITIVE (A)    Influenza B by PCR 09/01/2022 NEGATIVE    Group A Strep by PCR 09/01/2022 NOT DETECTED      This encounter employed real-time, collaborative documentation. The patient actively reviewed and updated their medical record on a shared screen, ensuring transparency and facilitating joint problem-solving for the problem list, overview, and plan. This approach promotes accurate, informed care. The treatment plan was discussed and reviewed in detail, including medication safety, potential side effects, and all patient questions. We confirmed understanding and comfort with the plan. Follow-up instructions were established, including contacting the office for any concerns, returning if symptoms worsen, persist, or new symptoms develop, and precautions for potential emergency department visits.  Attestation:  I have personally spent  63 minutes involved in face-to-face and non-face-to-face activities for this patient on the day of the visit. Professional time spent includes the following activities:  Preparing to see the patient by reviewing medical records prior to and during the encounter; Obtaining, documenting, and reviewing an updated medical history; Performing a medically appropriate examination;  Evaluating,  synthesizing, and documenting the available clinical information in the EMR;  Coordinating/Communicating/documenting for other health care professionals via detailed problem list development; Communicating, counseling, educating about results to the patient; Collaboratively developing and communicating and documenting in the AVS an individualized treatment plan with the patient; Placing medically necessary orders (for medications/tests/procedures/referrals);   This time was independent of any separately billable procedure(s).  The extended duration of this patient visit was medically necessary due to several factors:  The patient's health condition is multifaceted, requiring a comprehensive evaluation of patient and their past records to ensure accurate diagnosis and treatment planning; Effective patient education and communication, particularly for patients with complex  or extensive care needs, often require additional time to ensure the patient (or caregivers) fully understand the care plan;  Coordination of care with other healthcare professionals and services depends on thorough documentation, extending both documentation time and visit durations.  All these factors are integral to providing high-quality patient care and ensuring optimal health outcomes.   ----------------------------------------------------- Lula Olszewski, MD  06/17/2023 6:47 PM  [facility]:  [phone]

## 2023-06-17 NOTE — Assessment & Plan Note (Deleted)
Stable Well-controlled long term with Effexor, continue Discussed suboxone might help, but has to dc xanax.  Will do just a few days at first and confirm xanax discussion with serial urine drug screen. Depression: They have a longstanding history of depression, currently managed with Venlafaxine 150mg  daily. We will continue Venlafaxine and monitor for potential additional benefit from Suboxone.

## 2023-06-17 NOTE — Assessment & Plan Note (Deleted)
Taking alprazolam from Dr. Malen Gauze, 0.25 mg with [facility]    Anxiety: They have mild anxiety, currently managed with Alprazolam 0.25mg  as needed. We will consider discontinuing Alprazolam due to potential interaction with Suboxone. She is getting buprenorphine from Dr. Malen Gauze so will continue(s) with that.

## 2023-06-17 NOTE — Patient Instructions (Addendum)
VISIT SUMMARY:  During your visit, we discussed several health concerns including difficulty swallowing, chronic constipation, chronic pain, migraines, depression, anxiety, obesity, sleep apnea, mild scoliosis, and a history of gastric ulcers. We also discussed your history of opioid use disorder, which is currently in remission.  YOUR PLAN:  -DIFFICULTY SWALLOWING (DYSPHAGIA): This is when you have trouble swallowing. We will refer you to a gastroenterologist for further evaluation and possible treatment, which may include stretching your esophagus again.  -CHRONIC CONSTIPATION: This is when you have infrequent bowel movements or difficulty passing stool. We will continue your current treatment and monitor your response.  Try increasing fiber. We think this is due to [facility], but it may also be due to your recent pain medication(s) changes.  -OPIOID USE DISORDER IN REMISSION: This means you have a history of opioid addiction but are currently not using opioids. We are considering changing your pain medication to Suboxone, which may mean stopping your current medications, Tramadol and Xanax.  However, since you are already being transitioned to it by Dr. Malen Gauze, we will defer to Dr. Malen Gauze.  Warning: it isn't safe with xanax or other benzodiazepines.  -CHRONIC PAIN: You have ongoing pain in your right ankle, right knee, and lower back. We will continue your current pain medication and encourage shifting tramadol to higher dose buprenorphine with Dr. Malen Gauze, for more effective pain management.  -MIGRAINE WITHOUT AURA: This is a type of headache that causes severe throbbing pain, usually on one side of the head. Your migraines are currently well-controlled with your current medications, which we will continue.  -DEPRESSION: This is a mood disorder that causes a persistent feeling of sadness and loss of interest. We will continue your current medication and monitor for any additional benefits from  increasing buprenorphine  -ANXIETY: This is a feeling of worry, nervousness, or unease. We are considering stopping your current medication, Alprazolam, due to potential interaction with buprenorphine   -OBESITY: We will continue your current weight loss medication and monitor your progress. Take laxatives as needed.  -SLEEP APNEA: This is a serious sleep disorder that occurs when a person's breathing is interrupted during sleep. We will consider using the Snore[employer] app at home to monitor for intermittent sleep apnea and possibly continue(s) with CPAP.  -MILD SCOLIOSIS: This is a sideways curvature of the spine. We will order updated imaging to assess the current status.  -GASTRIC ULCERS: These are sores that develop on the lining of the stomach. You are currently not showing any symptoms, but we will monitor for recurrence of symptoms. If you abdomen discomfort worsens with Wegovy you may need to stop it.  INSTRUCTIONS:  Please follow the treatment plans as discussed. We will monitor your progress and make adjustments as necessary. If you have any questions or concerns, please do not hesitate to contact the office.   Welcome aboard!   Today's visit was a valuable first step in understanding your health and starting your personalized care journey. We discussed your medical history and medications in detail. Given the extensive information, we prioritized addressing your most pressing concerns.  We understood those concerns to be:  New Patient (Initial Visit), GI Problem (Stomach gets tight, bloated, full and gassy. No bowel movement for five days.), and Inability to swallow (Has had throat stretched previously and needs it periodically, food gets lodged in throat.)   Building a Complete Picture  To create the most effective care plan possible, we may need additional information from previous providers. We encouraged you  to gather any relevant medical records for your next visit. This will help  [place] build a more complete picture and develop a personalized plan together. In the meantime, we'll address your immediate concerns and provide resources to help you manage all of your medical issues.  We encourage you to use MyChart to review these efforts, and to help [place] find and correct any omissions or errors in your medical chart.  Managing Your Health Over Time  Managing every aspect of your health in a single visit isn't always feasible, but that's okay.  We addressed your most pressing concerns today and charted a course for future care. Acute conditions or preventive care measures may require further attention.  We encourage you to schedule a follow-up visit at your earliest convenience to discuss any unresolved issues.  We strongly encourage participation in annual preventive care visits to help [place] develop a more thorough understanding of your health and to help you maintain optimal wellness - please inquire about scheduling your next one with [place] at your earliest convenience.  Your Satisfaction Matters  It was a pleasure seeing you today!  Your health and satisfaction will always be my top priorities. If you believe your experience today was worthy of a 5-star rating, I'd be grateful for your feedback!  Lula Olszewski, MD   Next Steps  Schedule Follow-Up:  We recommend a follow-up appointment in [place] in about 3 months (around 09/17/2023) for chronic disease monitoring and management. If your condition worsens before then, please call us or seek emergency care. Preventive Care:  Don't forget to schedule your annual preventive care visit!  This important checkup is typically covered by insurance and helps identify potential health issues early.  Typically its 100% insurance covered with no co-pay and helps to get surveillance labwork paid for through your insurance provider.  Sometimes it even lowers your insurance premiums to participate. Medical Information Release:  For any relevant  medical information we don't have, please sign a release form so we can obtain it for your records. Lab & X-ray Appointments:  Scheduled any incomplete lab tests today or call us to schedule.  X-Rays can be done without an appointment at [facility] at [facility] ([address], [address]), M-F 8:30am-noon or 1pm-5pm.  Just tell them you're there for X-rays ordered by Dr. Jon Billings.  We'll receive the results and contact you by phone or MyChart to discuss next steps.  Bring to Your Next Appointment  Medications: Please bring all your medication bottles to your next appointment to ensure we have an accurate record of your prescriptions. Health Diaries: If you're monitoring any health conditions at home, keeping a diary of your readings can be very helpful for discussions at your next appointment.  Reviewing Your Records  Please Review this early draft of your clinical notes below and the final encounter summary tomorrow on MyChart after its been completed.   Pharyngeal dysphagia Assessment & Plan: Dysphagia: They have a history of esophageal dilation and currently experience food getting stuck. We will refer them to GI for evaluation and possible repeat dilation.  Orders: -     Ambulatory referral to Gastroenterology  Opioid use disorder in remission Assessment & Plan: Opioid Use Disorder in Remission: They have a history of opioid addiction and rehab, currently managing chronic pain with Tramadol. We will consider transitioning them to Suboxone for pain management, acknowledging the potential loss of Tramadol and Xanax prescriptions.   Intractable pain Assessment & Plan: Chronic Pain:  They have a history of ankle fracture and knee surgery, with current pain in the right ankle, right knee, and lower back, and mild degenerative disc disease. We will continue Tramadol as needed and consider transitioning to Suboxone for more effective pain management.   Closed fracture of right ankle,  sequela  Anxiety state Assessment & Plan: Taking alprazolam from Dr. Malen Gauze, 0.25 mg with [facility]    Anxiety: They have mild anxiety, currently managed with Alprazolam 0.25mg  as needed. We will consider discontinuing Alprazolam due to potential interaction with Suboxone. She is getting buprenorphine from Dr. Malen Gauze so will continue(s) with that.    WEIGHT GAIN -     TSH  OSA (obstructive sleep apnea) Assessment & Plan: Sleep Apnea: They have a history of CPAP use, but a recent sleep study suggested it is no longer needed. We will consider using the [employer]orelab app at home to monitor for intermittent sleep apnea.   Chronic depression Assessment & Plan: Stable Well-controlled long term with Effexor, continue Discussed suboxone might help, but has to dc xanax.  Will do just a few days at first and confirm xanax discussion with serial urine drug screen. Depression: They have a longstanding history of depression, currently managed with Venlafaxine 150mg  daily. We will continue Venlafaxine and monitor for potential additional benefit from Suboxone.  Orders: -     CBC -     Comprehensive metabolic panel  Chronic migraine without aura without status migrainosus, not intractable Assessment & Plan: Migraine without Aura: They have a longstanding history of migraines, currently well-controlled with Emgality injections and Nurtec/Emerge for rescue. We will continue their current regimen.   Chronic bilateral low back pain without sciatica -     DG Lumbar Spine Complete; Future  Obesity (BMI 30-39.9) Assessment & Plan: Obesity: They noted weight gain after turning 41 and are currently attempting weight loss with HYQMVH. We will continue their current regimen and monitor progress. They are getting the medication(s) by using compounding pharmacy, risks discussed. We will try to get prior authorization for more reliable/trustable source. Orders: -     Lipid panel  Chronic pain of right  knee -     DG Knee 1-2 Views Right; Future  Screening examination for infectious disease -     Hepatitis C antibody -     HIV Antibody (routine testing w rflx)  Other idiopathic scoliosis, lumbar region Assessment & Plan: Mild Scoliosis: Noted on imaging from 2014. We will order updated imaging to assess the current status.  Chronic constipation Assessment & Plan: Chronic Constipation: They have not had a bowel movement in 5 days despite taking stool softeners, possibly due to [facility]. We will continue their current regimen and monitor the response.  Other orders -     Semaglutide-Weight Management; Inject 0.25 mg into the skin once a week for 28 days.  Dispense: 2 mL; Refill: 0 -     Semaglutide-Weight Management; Inject 0.5 mg into the skin once a week for 28 days.  Dispense: 2 mL; Refill: 0 -     Semaglutide-Weight Management; Inject 1 mg into the skin once a week for 28 days.  Dispense: 2 mL; Refill: 0 -     Semaglutide-Weight Management; Inject 1.7 mg into the skin once a week for 28 days.  Dispense: 3 mL; Refill: 0 -     Semaglutide-Weight Management; Inject 2.4 mg into the skin once a week for 28 days.  Dispense: 3 mL; Refill: 0     Getting  Answers and Following Up  Simple Questions & Concerns: For quick questions or basic follow-up after your visit, reach [place] at [phone] or MyChart messaging. Complex Concerns: If your concern is more complex, scheduling an appointment might be best. Discuss this with the staff to find the most suitable option. Lab & Imaging Results: We'll contact you directly if results are abnormal or you don't use MyChart. Most normal results will be on MyChart within 2-3 business days, with a review message from Dr. Jon Billings. Haven't heard back in 2 weeks? Need results sooner? Contact us at [phone]. Referrals: Our referral coordinator will manage specialist referrals. The specialist's office should contact you within 2 weeks to schedule an  appointment. Call us if you haven't heard from them after 2 weeks.  Staying Connected  MyChart: Activate your MyChart for the fastest way to access results and message [place]. See the last page of this paperwork for instructions.  Billing  X-ray & Lab Orders: These are billed by separate companies. Contact the invoicing company directly for questions or concerns. Visit Charges: Discuss any billing inquiries with our administrative services team.  Feedback & Satisfaction  Share Your Experience: We strive for your satisfaction! If you have any complaints, please let Dr. Jon Billings know directly or contact our Practice Administrators, Edwena Felty or [employer], by asking at the front desk.  Scheduling Tips  Shorter Wait Times: 8 am and 1 pm appointments often have the quickest wait times. Longer Appointments: If you need more time during your visit, talk to the front desk. Due to insurance regulations, multiple back-to-back appointments might be necessary.

## 2023-06-18 ENCOUNTER — Other Ambulatory Visit: Payer: Self-pay

## 2023-06-18 ENCOUNTER — Other Ambulatory Visit: Payer: Self-pay | Admitting: Oncology

## 2023-06-18 DIAGNOSIS — G4700 Insomnia, unspecified: Secondary | ICD-10-CM

## 2023-06-18 DIAGNOSIS — Z006 Encounter for examination for normal comparison and control in clinical research program: Secondary | ICD-10-CM

## 2023-06-19 ENCOUNTER — Encounter: Payer: Self-pay | Admitting: Internal Medicine

## 2023-06-19 ENCOUNTER — Other Ambulatory Visit: Payer: Self-pay

## 2023-06-19 DIAGNOSIS — E785 Hyperlipidemia, unspecified: Secondary | ICD-10-CM | POA: Insufficient documentation

## 2023-06-19 NOTE — Progress Notes (Signed)
Please follow up with the patient by phone if they haven't viewed their MyChart results within 1 week. Discuss the slightly elevated Alkaline Phosphatase (119), low HDL Cholesterol (37.20), and high Triglycerides (184.0). Emphasize the importance of scheduling a follow-up appointment to discuss these results and potential adjustments to their treatment plan. Remind them to continue their current medications and lifestyle modifications for weight management and cardiovascular health.

## 2023-06-20 ENCOUNTER — Other Ambulatory Visit: Payer: Self-pay

## 2023-06-20 ENCOUNTER — Encounter (HOSPITAL_BASED_OUTPATIENT_CLINIC_OR_DEPARTMENT_OTHER): Payer: Self-pay

## 2023-06-20 ENCOUNTER — Encounter: Payer: Self-pay | Admitting: Internal Medicine

## 2023-06-20 ENCOUNTER — Emergency Department (HOSPITAL_BASED_OUTPATIENT_CLINIC_OR_DEPARTMENT_OTHER): Payer: BC Managed Care – PPO

## 2023-06-20 ENCOUNTER — Emergency Department (HOSPITAL_BASED_OUTPATIENT_CLINIC_OR_DEPARTMENT_OTHER)
Admission: EM | Admit: 2023-06-20 | Discharge: 2023-06-20 | Disposition: A | Discharge status: Home / Self Care | Payer: BC Managed Care – PPO | Attending: Emergency Medicine | Admitting: Emergency Medicine

## 2023-06-20 DIAGNOSIS — K5900 Constipation, unspecified: Secondary | ICD-10-CM | POA: Diagnosis not present

## 2023-06-20 DIAGNOSIS — D649 Anemia, unspecified: Secondary | ICD-10-CM | POA: Diagnosis not present

## 2023-06-20 DIAGNOSIS — R110 Nausea: Secondary | ICD-10-CM | POA: Insufficient documentation

## 2023-06-20 DIAGNOSIS — R109 Unspecified abdominal pain: Secondary | ICD-10-CM | POA: Diagnosis present

## 2023-06-20 LAB — COMPREHENSIVE METABOLIC PANEL
ALT: 23 U/L (ref 0–44)
AST: 28 U/L (ref 15–41)
Albumin: 4 g/dL (ref 3.5–5.0)
Alkaline Phosphatase: 108 U/L (ref 38–126)
Anion gap: 8 (ref 5–15)
BUN: 10 mg/dL (ref 6–20)
CO2: 27 mmol/L (ref 22–32)
Calcium: 9.4 mg/dL (ref 8.9–10.3)
Chloride: 103 mmol/L (ref 98–111)
Creatinine, Ser: 0.75 mg/dL (ref 0.44–1.00)
GFR, Estimated: >60 mL/min (ref >60)
Glucose, Bld: 119 mg/dL — ABNORMAL HIGH (ref 70–99)
Potassium: 3.7 mmol/L (ref 3.5–5.1)
Sodium: 138 mmol/L (ref 135–145)
Total Bilirubin: 0.3 mg/dL (ref 0.3–1.2)
Total Protein: 7.3 g/dL (ref 6.5–8.1)

## 2023-06-20 LAB — CBC
HCT: 35.3 % — ABNORMAL LOW (ref 36.0–46.0)
Hemoglobin: 11.6 g/dL — ABNORMAL LOW (ref 12.0–15.0)
MCH: 27.6 pg (ref 26.0–34.0)
MCHC: 32.9 g/dL (ref 30.0–36.0)
MCV: 84 fL (ref 80.0–100.0)
Platelets: 263 10*3/uL (ref 150–400)
RBC: 4.2 MIL/uL (ref 3.87–5.11)
RDW: 13.8 % (ref 11.5–15.5)
WBC: 4.8 10*3/uL (ref 4.0–10.5)
nRBC: 0 % (ref 0.0–0.2)

## 2023-06-20 LAB — URINALYSIS, ROUTINE W REFLEX MICROSCOPIC
Bilirubin Urine: NEGATIVE
Glucose, UA: NEGATIVE mg/dL
Hgb urine dipstick: NEGATIVE
Ketones, ur: NEGATIVE mg/dL
Leukocytes,Ua: NEGATIVE
Nitrite: NEGATIVE
Specific Gravity, Urine: >1.046 — ABNORMAL HIGH (ref 1.005–1.030)
pH: 7.5 (ref 5.0–8.0)

## 2023-06-20 LAB — LIPASE, BLOOD: Lipase: 26 U/L (ref 11–51)

## 2023-06-20 MED ORDER — SODIUM CHLORIDE 0.9 % IV BOLUS
1000.0000 mL | Freq: Once | INTRAVENOUS | Status: AC
  Administered 2023-06-20: 1000 mL via INTRAVENOUS

## 2023-06-20 MED ORDER — SENNOSIDES-DOCUSATE SODIUM 8.6-50 MG PO TABS: 1.0000 | ORAL_TABLET | Freq: Every day | ORAL | 0 refills | Status: AC

## 2023-06-20 MED ORDER — KETOROLAC TROMETHAMINE 15 MG/ML IJ SOLN
15.0000 mg | Freq: Once | INTRAMUSCULAR | Status: AC
  Administered 2023-06-20: 15 mg via INTRAVENOUS
  Filled 2023-06-20: qty 1

## 2023-06-20 MED ORDER — IOHEXOL 300 MG/ML  SOLN
100.0000 mL | Freq: Once | INTRAMUSCULAR | Status: AC | PRN
  Administered 2023-06-20: 100 mL via INTRAVENOUS

## 2023-06-20 MED ORDER — SODIUM CHLORIDE 0.9 % IV SOLN
12.5000 mg | Freq: Once | INTRAVENOUS | Status: AC
  Administered 2023-06-20: 12.5 mg via INTRAVENOUS
  Filled 2023-06-20: qty 0.5

## 2023-06-20 MED ORDER — DICYCLOMINE HCL 10 MG PO CAPS
10.0000 mg | ORAL_CAPSULE | Freq: Once | ORAL | Status: AC
  Administered 2023-06-20: 10 mg via ORAL
  Filled 2023-06-20: qty 1

## 2023-06-20 MED ORDER — PROMETHAZINE HCL 25 MG/ML IJ SOLN
INTRAMUSCULAR | Status: AC
  Filled 2023-06-20: qty 1

## 2023-06-20 MED ORDER — MORPHINE SULFATE (PF) 4 MG/ML IV SOLN
4.0000 mg | Freq: Once | INTRAVENOUS | Status: AC
  Administered 2023-06-20: 4 mg via INTRAVENOUS
  Filled 2023-06-20: qty 1

## 2023-06-20 NOTE — Discharge Instructions (Signed)
I recommend 64 oz of water / day and high fiber diet. You can use the sennakot medication for the next 3-5 days in addition to the above. If no improvement, I recommend 1/2 - 1 full bottle of miralax + a large bottle of gatorade for aggressive constipation management.

## 2023-06-20 NOTE — ED Provider Notes (Signed)
[place] EMERGENCY DEPARTMENT AT [facility] Provider Note   CSN: [id number] Arrival date & time: 06/20/23  1335     History  Chief Complaint  Patient presents with   Constipation   Bloated    Diane [name] is a 42 y.o. female with past medical history significant for anxiety, depression, previous opioid use disorder, chronic constipation who presents concern for abdominal pain, bloating, constipation for 8 days.  Patient reports that she normally has 1 loose bowel movement per day fairly regularly.  Patient does endorse continued narcotic use, takes tramadol once daily approximately, reports no significant change in her overall usage.  She is reported using MiraLAX, Dulcolax, Ex-Lax, and multiple different enemas infection without significant relief.  She does endorse some ongoing gas, as well as endorses some nausea at this moment.  She denies any vomiting.  Previous hx of appendectomy, C section.   Constipation      Home Medications Prior to Admission medications   Medication Sig Start Date End Date Taking? Authorizing Provider  senna-docusate (SENOKOT-S) 8.6-50 MG tablet Take 1 tablet by mouth daily. 06/20/23  Yes Trina Asch H, PA-C  ADDERALL XR 20 MG 24 hr capsule Take 20 mg by mouth every morning. 07/09/18   [provider]  albuterol (PROVENTIL HFA;VENTOLIN HFA) 108 (90 Base) MCG/ACT inhaler Inhale 2 puffs into the lungs every 6 (six) hours as needed for wheezing or shortness of breath.     [provider]  ALPRAZolam Prudy Feeler) 0.25 MG tablet SMARTSIG:0.5 Tablet(s) By Mouth PRN 07/17/21   [provider]  atorvastatin (LIPITOR) 40 MG tablet Take 1 tablet (40 mg total) by mouth daily. 06/24/22 06/19/23  Runell Gess, MD  Bacillus Coagulans-Inulin (PROBIOTIC) 1-250 BILLION-MG CAPS     [provider]  beclomethasone (QVAR) 40 MCG/ACT inhaler Inhale into the lungs. 04/09/17   [provider]  Biotin 10 MG TABS      [provider]  cetirizine (ZYRTEC) 10 MG tablet Take by mouth. 06/16/17   [provider]  cyclobenzaprine (FLEXERIL) 10 MG tablet Take 1 tablet by mouth 3 (three) times daily. 10/22/22   [provider]  diclofenac sodium (VOLTAREN) 1 % GEL APPLY ONE APPLICATION FOUR TIMES DAILY AS NEEDED 11/05/18   [provider]  EMGALITY 120 MG/ML SOAJ Inject 120 mg into the skin every 30 (thirty) days. 08/02/18   [provider]  fluticasone (FLONASE) 50 MCG/ACT nasal spray Place into the nose.    [provider]  gabapentin (NEURONTIN) 300 MG capsule TAKE 2 CAPSULES BY MOUTH 3 TIMES DAILY Patient taking differently: Take 600-1,200 mg by mouth See admin instructions. Take 600mg  by mouth in the morning and 1,200mg  in the evening 05/26/17   Nelwyn Salisbury, MD  levocetirizine (XYZAL) 5 MG tablet Take 5 mg by mouth every evening. 11/23/18   [provider]  LINZESS 290 MCG CAPS capsule Take 290 mcg by mouth daily. 07/19/21   [provider]  Magnesium 400 MG TABS Take 1,200 mg by mouth at bedtime.     [provider]  magnesium oxide (MAG-OX) 400 MG tablet Take by mouth. 06/16/17   [provider]  meclizine (ANTIVERT) 25 MG tablet Take 12.5 mg by mouth 3 (three) times daily as needed. 06/19/21   [provider]  mirtazapine (REMERON) 30 MG tablet Take 30 mg by mouth daily. 09/19/22   [provider]  Multiple Vitamins-Minerals (EQL CENTURY WOMENS) TABS     [provider]  omeprazole (PRILOSEC) 40 MG capsule Take 40 mg by mouth daily. 06/06/21   [provider]  ondansetron (ZOFRAN) 4 MG tablet Take 1 tablet (4 mg total) by mouth every 6 (six) hours as needed for nausea or vomiting. 09/01/22   Edwin Dada P, DO  pantoprazole (PROTONIX) 20 MG tablet Take 20 mg by mouth 2 (two) times daily. 08/18/22   [provider]  Polyethylene Glycol 3350 (MIRALAX PO) Take by mouth at bedtime.     [provider]  promethazine (PHENERGAN) 25 MG tablet Take 1 tablet (25 mg total) by mouth every 8 (eight) hours as needed for nausea or vomiting. 08/14/17   Waldon Merl, PA-C  Rimegepant Sulfate (NURTEC) 75 MG TBDP Nurtec ODT 75 mg disintegrating tablet  TAKE ONE TABLET BY MOUTH AS NEEDED. one PER 24 hours 05/17/20   [provider]  Semaglutide-Weight Management (WEGOVY) 0.25 MG/0.5ML SOAJ Inject 0.25 mg into the skin once a week. 09/22/22     Semaglutide-Weight Management 0.25 MG/0.5ML SOAJ Inject 0.25 mg into the skin once a week for 28 days. 06/17/23 07/15/23  Lula Olszewski, MD  Semaglutide-Weight Management 0.5 MG/0.5ML SOAJ Inject 0.5 mg into the skin once a week for 28 days. 07/16/23 08/13/23  Lula Olszewski, MD  Semaglutide-Weight Management 1 MG/0.5ML SOAJ Inject 1 mg into the skin once a week for 28 days. 08/14/23 09/11/23  Lula Olszewski, MD  Semaglutide-Weight Management 1.7 MG/0.75ML SOAJ Inject 1.7 mg into the skin once a week for 28 days. 09/12/23 10/10/23  Lula Olszewski, MD  Semaglutide-Weight Management 2.4 MG/0.75ML SOAJ Inject 2.4 mg into the skin once a week for 28 days. 10/11/23 11/08/23  Lula Olszewski, MD  tiZANidine (ZANAFLEX) 4 MG tablet Take 4 mg by mouth every 6 (six) hours as needed for muscle spasms.  11/18/16   [provider]  traMADol (ULTRAM) 50 MG tablet Take 50 mg by mouth 4 (four) times daily as needed. 05/28/23   [provider]  UBRELVY 100 MG TABS Take by mouth. 07/19/21   [provider]  venlafaxine XR (EFFEXOR-XR) 150 MG 24 hr capsule Take 2 capsules (300 mg total) by mouth daily before breakfast. 11/06/16   Nelwyn Salisbury, MD  Vitamin A 2400 MCG (8000 UT) TABS     [provider]  Zinc 50 MG TABS     [provider]  zonisamide (ZONEGRAN) 100 MG capsule Take 100 mg by mouth at bedtime. 07/18/22   [provider]      Allergies    Nickel, Prochlorperazine, Nsaids, Tolmetin,  Oxybutynin, Adhesive [tape], Metoclopramide hcl, Prochlorperazine edisylate, and Sulfa antibiotics    Review of Systems   Review of Systems  Gastrointestinal:  Positive for constipation.  All other systems reviewed and are negative.   Physical Exam Updated Vital Signs BP 118/79 (BP Location: Right Arm)   Pulse (!) 116   Temp 98.2 F (36.8 C) (Temporal)   Resp 20   Ht 5\' 7"  (1.702 m)   Wt 102 kg   LMP 08/26/2018   SpO2 100%   BMI 35.22 kg/m  Physical Exam Vitals and nursing note reviewed.  Constitutional:      General: She is not in acute distress.    Appearance: Normal appearance.  HENT:     Head: Normocephalic and atraumatic.  Eyes:     General:        Right eye: No discharge.  Left eye: No discharge.  Cardiovascular:     Rate and Rhythm: Normal rate and regular rhythm.     Heart sounds: No murmur heard.    No friction rub. No gallop.  Pulmonary:     Effort: Pulmonary effort is normal.     Breath sounds: Normal breath sounds.  Abdominal:     General: Bowel sounds are normal.     Palpations: Abdomen is soft.     Comments: Some tenderness to palpation of the left and right lower quadrants with some bloating, no rigidity, rebound, guarding.  Abdomen is soft despite bloating.  Genitourinary:    Comments: Normal external appearance of rectum, no palpable stool in the rectal vault Skin:    General: Skin is warm and dry.     Capillary Refill: Capillary refill takes less than 2 seconds.  Neurological:     Mental Status: She is alert and oriented to person, place, and time.  Psychiatric:        Mood and Affect: Mood normal.        Behavior: Behavior normal.     ED Results / Procedures / Treatments   Labs (all labs ordered are listed, but only abnormal results are displayed) Labs Reviewed  COMPREHENSIVE METABOLIC PANEL - Abnormal; Notable for the following components:      Result Value   Glucose, Bld 119 (*)    All other components within normal limits   CBC - Abnormal; Notable for the following components:   Hemoglobin 11.6 (*)    HCT 35.3 (*)    All other components within normal limits  URINALYSIS, ROUTINE W REFLEX MICROSCOPIC - Abnormal; Notable for the following components:   Specific Gravity, Urine >1.046 (*)    Protein, ur TRACE (*)    All other components within normal limits  LIPASE, BLOOD    EKG None  Radiology CT ABDOMEN PELVIS W CONTRAST  Result Date: 06/20/2023 CLINICAL DATA:  Worsening constipation and bloating over the past 8 days. EXAM: CT ABDOMEN AND PELVIS WITH CONTRAST TECHNIQUE: Multidetector CT imaging of the abdomen and pelvis was performed using the standard protocol following bolus administration of intravenous contrast. RADIATION DOSE REDUCTION: This exam was performed according to the departmental dose-optimization program which includes automated exposure control, adjustment of the mA and/or kV according to patient size and/or use of iterative reconstruction technique. CONTRAST:  OMNIPAQUE IOHEXOL 300 MG/ML  SOLN COMPARISON:  CT abdomen pelvis dated July 13, 2020. FINDINGS: Lower chest: No acute abnormality. Hepatobiliary: No focal liver abnormality is seen. Status post cholecystectomy. No biliary dilatation. Pancreas: Unremarkable. No pancreatic ductal dilatation or surrounding inflammatory changes. Spleen: Normal in size without focal abnormality. Adrenals/Urinary Tract: Adrenal glands are unremarkable. Kidneys are normal, without renal calculi, focal lesion, or hydronephrosis. Bladder is decompressed. Stomach/Bowel: Stomach is within normal limits. Prior appendectomy. No evidence of bowel wall thickening, distention, or inflammatory changes. Mild diffusely increased colonic stool burden. Vascular/Lymphatic: No significant vascular findings are present. No enlarged abdominal or pelvic lymph nodes. Reproductive: Status post hysterectomy. No adnexal masses. Other: No free fluid or pneumoperitoneum.  Musculoskeletal: No acute or significant osseous findings. IMPRESSION: 1. No acute intra-abdominal process. Electronically Signed   By: Obie Dredge M.D.   On: 06/20/2023 16:07    Procedures Procedures    Medications Ordered in ED Medications  promethazine (PHENERGAN) 25 MG/ML injection (has no administration in time range)  ketorolac (TORADOL) 15 MG/ML injection 15 mg (15 mg Intravenous Given 06/20/23 1542)  iohexol (OMNIPAQUE) 300 MG/ML  solution 100 mL (100 mLs Intravenous Contrast Given 06/20/23 1549)  promethazine (PHENERGAN) 12.5 mg in sodium chloride 0.9 % 50 mL IVPB (12.5 mg Intravenous New Bag/Given 06/20/23 1640)  sodium chloride 0.9 % bolus 1,000 mL (1,000 mLs Intravenous New Bag/Given 06/20/23 1647)  morphine (PF) 4 MG/ML injection 4 mg (4 mg Intravenous Given 06/20/23 1644)    ED Course/ Medical Decision Making/ A&P                                 Medical Decision Making Amount and/or Complexity of Data Reviewed Labs: ordered.   This patient is a 42 y.o. female who presents to the ED for concern of constipation, abdominal bloating, this involves an extensive number of treatment options, and is a complaint that carries with it a high risk of complications and morbidity. The emergent differential diagnosis prior to evaluation includes, but is not limited to, constipation, bowel obstruction, versus AAA, mesenteric ischemia, appendicitis, diverticulitis, DKA, gastritis, gastroenteritis, AMI, nephrolithiasis, pancreatitis, peritonitis, adrenal insufficiency,lead poisoning, iron toxicity, intestinal ischemia, constipation, UTI, splenic rupture, biliary disease, IBD, IBS, PUD, or hepatitis.  . This is not an exhaustive differential.   Past Medical History / Co-morbidities / Social History: anxiety, depression, previous opioid use disorder, chronic constipation  Additional history: Chart reviewed. Pertinent results include: reviewed labwork, imaging from previous ED visits, and  outpatient family medicine visits. Notable on chronic tramadol.  Physical Exam: Physical exam performed. The pertinent findings include: Some tenderness to palpation of the left and right lower quadrants with some bloating, no rigidity, rebound, guarding.  Abdomen is soft despite bloating, Normal external appearance of rectum, no palpable stool in the rectal vault  Lab Tests: I ordered, and personally interpreted labs.  The pertinent results include:  CBC with mild anemia. Lipase normal. CMP unremarkable, glucose of 119, non fasting lab value. UA with high specific gravity, suggestive of dehydration. Patient does endorse poor PO intake.   Imaging Studies: I ordered imaging studies including CT abd pelvis. I independently visualized and interpreted imaging which showed no acute intraabdominal process. I agree with the radiologist interpretation.   Medications: I ordered medication including bolus for mild dehydration, morphine for pain, phenergan for nausea, toradol for pain. Reevaluation of the patient after these medicines showed that the patient improved. I have reviewed the patients home medicines and have made adjustments as needed. Discharged with sennakot, encouraged fluid intake, high fiber diet, GI follow up   Disposition: After consideration of the diagnostic results and the patients response to treatment, I feel that patient is stable for discharge with plan as above .   emergency department workup does not suggest an emergent condition requiring admission or immediate intervention beyond what has been performed at this time. The plan is: as above. The patient is safe for discharge and has been instructed to return immediately for worsening symptoms, change in symptoms or any other concerns.  Final Clinical Impression(s) / ED Diagnoses Final diagnoses:  Constipation, unspecified constipation type    Rx / DC Orders ED Discharge Orders          Ordered    senna-docusate  (SENOKOT-S) 8.6-50 MG tablet  Daily        06/20/23 1659              Abdur Hoglund, Harrel Carina, PA-C 06/20/23 1713    Benjiman Core, MD 06/20/23 [phone]

## 2023-06-20 NOTE — Progress Notes (Signed)
Please follow up with the patient by phone if they haven't viewed their MyChart results within 72 hours. Inform them that their right ankle X-ray from 06/20/2023 shows a well-healed previous fracture with mild, stable degenerative changes. Remind them to continue their current pain management plan and to report any changes in symptoms. Offer to schedule a follow-up appointment if the patient has immediate concerns or questions about managing their ongoing ankle pain.

## 2023-06-20 NOTE — ED Notes (Signed)
 RN reviewed discharge instructions with pt. Pt verbalized understanding and had no further questions. VSS upon discharge.  

## 2023-06-20 NOTE — Progress Notes (Signed)
Please follow up with the patient by phone if they haven't viewed their MyChart results within 72 hours. Inform them that their right knee X-ray from 06/20/2023 shows no significant abnormalities, despite their reported chronic pain and swelling. Remind them to continue their current pain management plan and to report any changes in symptoms. Offer to schedule a follow-up appointment if the patient has immediate concerns or questions about managing their ongoing knee pain.

## 2023-06-20 NOTE — ED Triage Notes (Signed)
Patient arrives with complaints of worsening constipation and bloating x8 days. Patient states that she has been trying OTC meds and enemas at home with no relief. Patient reports increased bloating and nausea.

## 2023-06-20 NOTE — Progress Notes (Signed)
Please follow up with the patient by phone if they haven't viewed their MyChart results within 72 hours. Inform them that their lumbar spine X-ray shows mild degenerative changes consistent with their known back pain. Remind them to continue their current pain management plan and to report any new or worsening symptoms. Offer to schedule a follow-up appointment if the patient has immediate concerns.

## 2023-06-25 ENCOUNTER — Telehealth: Payer: Self-pay | Admitting: Gastroenterology

## 2023-06-25 ENCOUNTER — Encounter: Payer: Self-pay | Admitting: Internal Medicine

## 2023-06-25 NOTE — Telephone Encounter (Signed)
Good Morning Dr Russella Dar   We have received a referral for patient to be seen for abdominal discomfort and Pharyngeal dysphasia.   Patient was established with you on 2018 then proceeded to see digestive health and later [facility] in 2022.   Records are available for review in epic. Patient requesting transfer due to relocation to the [place] area.   Please review and advise on scheduling.

## 2023-06-26 NOTE — Telephone Encounter (Signed)
Request received to transfer GI care from outside practice to [place].  We appreciate the interest in our practice, however at this time due to capacity and high demand from patients without established GI providers we cannot accommodate this transfer.

## 2023-06-29 ENCOUNTER — Other Ambulatory Visit: Payer: Self-pay

## 2023-07-13 ENCOUNTER — Ambulatory Visit (INDEPENDENT_AMBULATORY_CARE_PROVIDER_SITE_OTHER): Payer: BC Managed Care – PPO | Admitting: Internal Medicine

## 2023-07-13 ENCOUNTER — Encounter: Payer: Self-pay | Admitting: Internal Medicine

## 2023-07-13 VITALS — BP 120/75 | HR 89 | Temp 98.1°F | Ht 67.0 in | Wt 231.2 lb

## 2023-07-13 DIAGNOSIS — Z0000 Encounter for general adult medical examination without abnormal findings: Secondary | ICD-10-CM

## 2023-07-13 DIAGNOSIS — M25473 Effusion, unspecified ankle: Secondary | ICD-10-CM

## 2023-07-13 DIAGNOSIS — R638 Other symptoms and signs concerning food and fluid intake: Secondary | ICD-10-CM

## 2023-07-13 DIAGNOSIS — Z0001 Encounter for general adult medical examination with abnormal findings: Secondary | ICD-10-CM | POA: Diagnosis not present

## 2023-07-13 DIAGNOSIS — N951 Menopausal and female climacteric states: Secondary | ICD-10-CM | POA: Diagnosis not present

## 2023-07-13 DIAGNOSIS — R7303 Prediabetes: Secondary | ICD-10-CM | POA: Diagnosis not present

## 2023-07-13 DIAGNOSIS — E7849 Other hyperlipidemia: Secondary | ICD-10-CM

## 2023-07-13 LAB — POCT GLYCOSYLATED HEMOGLOBIN (HGB A1C): Hemoglobin A1C: 5.8 % — AB (ref 4.0–5.6)

## 2023-07-13 NOTE — Progress Notes (Unsigned)
Phone [phone]  -- Comprehensive Physical Exam (CPE) Annual Office Visit  --  Patient:  Diane [name]      Age: 42 y.o.       Sex:  female  Date:   07/13/2023 Patient Care Team: Lula Olszewski, MD as PCP - General (Internal Medicine) Olivia Mackie, MD (Obstetrics and Gynecology) Annia Belt, MD as Referring Physician (Psychiatry) Today's Healthcare Provider: Lula Olszewski, MD  ------------------------------------------------------------------------------------------------------------------------------------- Chief Complaint  Patient presents with   Annual Exam   Menopause    Night sweats.   Weight Loss    Wegovy denied request.     Assessment & Plan Menopausal syndrome (hot flushes) Severe hot flashes and night sweats have been addressed, with a discussion on the risks and benefits of estrogen replacement therapy, including an increased risk of endometrial and breast cancer. Given her weight and history of endometrial bleeding, she decided against estrogen replacement. We will continue Effexor and Gabapentin for hot flash management- and gave Veozah samples to try Prediabetes An A1c of 5.8 indicates prediabetes. The significance of diet and exercise in managing prediabetes and preventing its progression to diabetes was discussed. We will continue Semaglutide 0.5mg  for weight loss and check A1c at the next visit. Weight disorder We had discussion of GLP-1 agonist options but without stroke myocardial infarction or diabetes mellitus her insurance won't cover so its not affordable She is not interested generic weight loss medications    Ankle swelling, unspecified laterality The risks of chronic fluid pill use, such as kidney damage and severe dehydration, were discussed, leading to a decision against their use. A referral to a vein and vascular specialist for evaluation and potential procedural fix is recommended. Other hyperlipidemia She presents with high  triglycerides and low HDL, alongside a family history of genetic vascular disease. The importance of aggressive management through diet and medication to prevent early heart disease and cerebrovascular disease was emphasized. We will continue Atorvastatin and recommend dietary changes, including increased intake of avocados and fatty fish and avoidance of processed foods. Encounter for annual general medical examination with abnormal findings in adult Today's preventive care visit included comprehensive health maintenance evaluation and gap closure alongside extensive anticipatory guidance, as follows: She is advised to continue annual eye exams and dental visits every six months, along with nightly sinus rinses and walking for exercise 1-2 times per week. A mammogram and discussion of breast exam are recommended at the next gynecology visit due to a family history of breast and ovarian cancer. A colonoscopy is recommended due to a family history of colon cancer, along with a dermatology consultation for potential psoriasis. The next annual exam is scheduled for one year from now.  #  Eye exams: recommended every 1-2 years.   She voiced understanding and intent to adhere to that plan. "I do" #  Dental health: recommended regular tooth brushing, flossing, and dental visits every 6 months.  She voiced understanding and intent to adhere to that plan. "MMM -=hmm yep" #  Sinus health: nightly SimplySaline or similar recommended now.  She voiced understanding and intent to adhere to that plan."I do that" #  Diet/Exercise:   recommended regular exercise (150 min) and diet rich and fruits and vegetables and fiber and healthy fats to reduce risk of heart attack and stroke. She voiced understanding and intent to adhere to this plan.  "I'm starting to walk more, especially since my son, he's needing the exercise were walking in the park at least 1.5 hours  and get sweaty."  "Diet could be better, sometimes snack at  night, for example [place] ice" #  Sexuality: recommended STD prevention via partner selection & condoms.  Offered contraception / STD checks / genital wart treatment if appropriate. #  Cervical cancer screening: Recommended pap smear with hpv every 5 yrs from 25-65.  Offered gynecology referral or completion here in accordance with patient preferences. When she leaves today she plans to schedule with gynecology  for this and breast cancer screening discussion. We discussed briefly. #  Uterine/ovarian cancer screening:  discussed current lack of screening guidelines or insurance support for testing.  Explained signs of endometrial cancer are irregular bleeding, postmenopausal bleeding, or pelvic pain.  Signs of ovarian cancer might be a frequent swollen tummy or feeling bloated.pain or tenderness in your tummy or the area between the hips (pelvis), no appetite or feeling full quickly after eating, or an urgent need to pee or needing to pee more often. #  Thyroid cancer screening:  discussed need to palpate thyroid about every 6 months for nodules #  Colon cancer screening:  family history of colon cancer AND GI bleeding,we have referral pending to gastrointestinal. #  Skin cancer screening: recommended regular sunscreen use. she denies worrisome, changing, or new skin lesions.  #  Osteoporosis prevention:  recommended to maintain a good source of calcium and vitamin D in diet.  She denies any personal history of early osteoporosis or fragility fractures.  She takes womens calcium and vitamin D.  #  Substance use: recommended absolute abstinence from all vaped or smoked recreational or illicit substances of abuse such as tobacco, nicotine, alcohol, illicit drugs, even sugar.  #  Safety:  recommended avoiding high risk activities, wear seat belts, use smoke detectors #  Health maintenance and immunizations reviewed and she was encouraged to complete any incomplete and release any missing immunization records  for [place] Immunization History  Administered Date(s) Administered   HPV Quadrivalent 10/22/2005   Influenza Split 07/27/2009, 09/28/2012   Influenza, Seasonal, Injecte, Preservative Fre 10/17/2011   Influenza,inj,Quad PF,6+ Mos 09/28/2015, 07/12/2017, 07/21/2017, 07/09/2018, 12/22/2022   Influenza-Unspecified 09/28/2015, 08/29/2019   Tdap 11/21/2011, 08/10/2019   #  We attempted to update vaccination records and provide any missing vaccines that are recommended. Health Maintenance Due  Topic Date Due   HPV VACCINES (2 - 3-dose series) 11/19/2005   PAP SMEAR-Modifier  08/13/2019   INFLUENZA VACCINE  06/18/2023    # Incomplete health maintenance issues listed above were brought up for discussion and she was encouraged to complete with our assistance. # Recommended follow up:  continued annual preventive health maintenance exams Preventative health care         Diagnoses and all orders for this visit: Menopausal syndrome (hot flushes) Prediabetes -     POCT HgB A1C Weight disorder -     Amb Ref to Medical Weight Management Ankle swelling, unspecified laterality -     Ambulatory referral to Vascular Surgery Other hyperlipidemia Encounter for annual general medical examination with abnormal findings in adult Preventative health care    Subjective   Shehas DEPRESSION, ACUTE; Anxiety state; Opioid use disorder in remission; back pain, chronic; Knee pain, right, chronic; Ankle pain, chronic; OSA (obstructive sleep apnea); Chronic migraine without aura; Osteochondral lesion; Dysphagia; Intractable pain; Chronic constipation; Other idiopathic scoliosis, lumbar region; Obesity (BMI 30-39.9); Hyperlipidemia; Menopausal syndrome (hot flushes); Prediabetes; Weight disorder; and Ankle swelling on their problem list. Discussed the use of AI scribe software for clinical note transcription  with the patient, who gave verbal consent to proceed.  History of Present Illness   The patient, with a  history of prediabetes and a family history of cardiovascular disease, presents with concerns of menopausal symptoms, specifically severe hot flashes. She reports frequent episodes of sweating, to the point of soaking her clothes, and needing a neck fan to apply makeup. The patient underwent a partial hysterectomy at the age of 3, which initiated early menopausal symptoms, but she now believes she is in full menopause.  The patient also reports a recent denial from her insurance for the weight loss medication, [id number], which she was hoping to start. She expresses a strong desire to lose weight, attributing some of her weight to persistent bloating and irregular bowel movements. She reports periods of constipation followed by episodes of frequent bowel movements. The patient is awaiting a referral to a gastroenterologist for these symptoms.  The patient has a family history of genetic coronary disease, which has prompted a discussion about aggressive management of her high triglycerides and low HDL levels. She expresses concern about her weight and bloating, suspecting a link to her gastrointestinal issues.  The patient also reports a history of multiple surgeries on her ankles, resulting in chronic swelling and discomfort. She expresses interest in a consultation with a vein specialist to address this issue.  Lastly, the patient mentions a small rash on her elbow, which she suspects might be psoriasis due to its location. She has a history of working with a dermatologist and is familiar with skin lesions and melanoma. She expresses willingness to return to a dermatologist if the rash worsens or if it is indeed psoriasis.     Review of Systems  Constitutional:  Negative for chills, diaphoresis, fever, malaise/fatigue and weight loss.  HENT:  Negative for congestion, ear discharge, ear pain, hearing loss, nosebleeds, sinus pain, sore throat and tinnitus.   Eyes:  Negative for blurred vision, double  vision, photophobia, pain, discharge and redness.  Respiratory:  Negative for cough, hemoptysis, sputum production, shortness of breath, wheezing and stridor.   Cardiovascular:  Negative for chest pain, palpitations, orthopnea, claudication, leg swelling and PND.  Gastrointestinal:  Positive for blood in stool. Negative for abdominal pain, constipation, diarrhea, heartburn, melena, nausea and vomiting.  Genitourinary:  Negative for dysuria, flank pain, frequency, hematuria and urgency.  Musculoskeletal:  Negative for back pain, falls, joint pain, myalgias and neck pain.  Skin:  Negative for itching and rash.  Neurological:  Negative for dizziness, tingling, tremors, sensory change, speech change, focal weakness, seizures, loss of consciousness, weakness and headaches.  Endo/Heme/Allergies:  Negative for environmental allergies and polydipsia. Does not bruise/bleed easily.  Psychiatric/Behavioral:  Negative for depression, hallucinations, memory loss, substance abuse and suicidal ideas. The patient is not nervous/anxious and does not have insomnia.     During today's preventive visit, the patient has expressed that any positive findings in the review of systems are related to chronic conditions that are stable.  The patient has requested that these issues not be addressed in this visit, with the understanding that they are part of ongoing management and do not require additional workup at this time.  PROBLEMS,PMH, PSH, FH, prior meds, allergies, and SH were each reviewed and updated:    06/17/2023   10:20 AM  Depression screen PHQ 2/9  Decreased Interest 1  Down, Depressed, Hopeless 1  PHQ - 2 Score 2  Altered sleeping 1  Tired, decreased energy 2  Change in appetite 1  Feeling  bad or failure about yourself  0  Trouble concentrating 1  Moving slowly or fidgety/restless 0  Suicidal thoughts 0  PHQ-9 Score 7  Difficult doing work/chores Somewhat difficult   Patient Active Problem List    Diagnosis Date Noted   Prediabetes 07/14/2023   Weight disorder 07/14/2023   Ankle swelling 07/14/2023   Menopausal syndrome (hot flushes) 07/13/2023   Hyperlipidemia 06/19/2023   Intractable pain 06/17/2023   Chronic constipation 06/17/2023   Other idiopathic scoliosis, lumbar region 06/17/2023   Dysphagia 08/12/2017   Osteochondral lesion 06/03/2017   OSA (obstructive sleep apnea) 03/24/2017   Ankle pain, chronic 01/05/2016   Knee pain, right, chronic 04/12/2015   Chronic migraine without aura 04/01/2013   DEPRESSION, ACUTE 10/02/2008   Opioid use disorder in remission 10/02/2008   back pain, chronic 03/28/2008   Obesity (BMI 30-39.9) 01/25/2008   Anxiety state 11/09/2007   Past Medical History:  Diagnosis Date   ADD (attention deficit disorder with hyperactivity)    Allergy    Anxiety    Arthritis    Atypical chest pain 07/26/2021   Atypical chest pain     Benign intracranial hypertension 06/16/2008   Qualifier: Diagnosis of   By: Nira Conn         Choking    due to food and pills get stuck   Cholelithiasis and acute cholecystitis without obstruction 12/02/2016   Chronic cough 12/27/2013   Depression    Dry mouth 08/12/2017   Dysmenorrhea    Family history of adverse reaction to anesthesia    pt's father has hx. of being hard to wake up post-op   Gastric ulcer 03/16/2015   GERD (gastroesophageal reflux disease)    Headache 07/09/2007   Qualifier: Diagnosis of   By: Cato Mulligan MD, Bruce      Replacing diagnoses that were inactivated after the 02/16/23 regulatory import     History of gastric ulcer    History of kidney stones    Hyperlipidemia    IBS (irritable bowel syndrome)    Lower extremity edema 07/26/2021   Lower extremity edema     Migraines    neurologist-  dr c. Sharene Skeans ([facility] in [place])-- treated with Emgality injection every 30 days   Mild obstructive sleep apnea    per study in epic 03-21-2017 mild osa, recommendation cpap,  mouth appliance, wt loss   Nausea and vomiting 12/03/2016   OAB (overactive bladder) 08/12/2017   Osteoarthritis    right ankle   Osteochondral lesion 09/2016   right ankle   Osteomyelitis of knee region [facility])    Peripheral vascular disease (HCC)    NERVE DAMAGE RIGHT LEG AND FOOT DUE TO INJURGY.    Plantar fasciitis of left foot    Pleural effusion 12/15/2013   PONV (postoperative nausea and vomiting)    AND HEADACHES   Pseudotumor cerebri    at back head   RUQ pain    S/P dilatation of esophageal stricture 07/2018   Sinusitis 08/12/2018   RESOLVED WITH ANTIOBIOTIC   SUI (stress urinary incontinence, female)    Wears contact lenses    Wears partial dentures    upper    Past Surgical History:  Procedure Laterality Date   ANKLE ARTHROSCOPY Right 10/22/2016   Procedure: ANKLE ARTHROSCOPY;  Surgeon: Vivi Barrack, DPM;  Location: [facility];  Service: Podiatry;  Laterality: Right;  GENERAL/REG BLOCK   ANKLE ARTHROSCOPY Right 09-15-2017   @[facility]    BLADDER SURGERY  child  x 2 - as a child to stretch bladder   CESAREAN SECTION  12/24/2009   CESAREAN SECTION  11/20/2011   Procedure: CESAREAN SECTION;  Surgeon: Lenoard Aden, MD;  Location: [facility];  Service: Gynecology;  Laterality: N/A;   CHOLECYSTECTOMY N/A 12/03/2016   Procedure: LAPAROSCOPIC CHOLECYSTECTOMY WITH INTRAOPERATIVE CHOLANGIOGRAM;  Surgeon: Darnell Level, MD;  Location: [facility];  Service: General;  Laterality: N/A;   CYSTOSCOPY WITH RETROGRADE PYELOGRAM, URETEROSCOPY AND STENT PLACEMENT Left 08/10/2006   ESOPHAGOGASTRODUODENOSCOPY (EGD) WITH ESOPHAGEAL DILATION  07/2018   HARDWARE REMOVAL Right 07/01/2016   Procedure: HARDWARE REMOVAL;  Surgeon: Cammy Copa, MD;  Location: [facility];  Service: Orthopedics;  Laterality: Right;   KNEE ARTHROSCOPY WITH LATERAL RELEASE Right 12/13/2014   Procedure: KNEE ARTHROSCOPY WITH LATERAL RELEASE;  Surgeon: Thera Flake., MD;  Location: [facility];   Service: Orthopedics;  Laterality: Right;   LAPAROSCOPIC APPENDECTOMY N/A 06/11/2014   Procedure: APPENDECTOMY LAPAROSCOPIC;  Surgeon: Velora Heckler, MD;  Location: [facility];  Service: General;  Laterality: N/A;   ORIF ANKLE FRACTURE Right 01/05/2016   Procedure: OPEN REDUCTION INTERNAL FIXATION (ORIF) ANKLE FRACTURE;  Surgeon: Cammy Copa, MD;  Location: [facility];  Service: Orthopedics;  Laterality: Right;   OVARIAN CYST SURGERY Right 03/05/2007   AND CHROMOPERTUBATION  VIA LAPAROSCOPY   ROBOTIC ASSISTED LAPAROSCOPIC HYSTERECTOMY AND SALPINGECTOMY Bilateral 08/27/2018   Procedure: XI ROBOTIC ASSISTED LAPAROSCOPIC HYSTERECTOMY AND BILATERAL SALPINGECTOMY;  Surgeon: Olivia Mackie, MD;  Location: [facility];  Service: Gynecology;  Laterality: Bilateral;  [facility] for 23hr OBS   SYNOVECTOMY Right 12/13/2014   Procedure: PLICA SYNOVECTOMY;  Surgeon: Thera Flake., MD;  Location: [facility];  Service: Orthopedics;  Laterality: Right;   TONSILLECTOMY AND ADENOIDECTOMY  child   WISDOM TOOTH EXTRACTION     Family History  Problem Relation Age of Onset   Learning disabilities Mother    Asthma Mother    Arthritis Mother    Irritable bowel syndrome Mother    Miscarriages / [place] Mother    Hypertension Father    Early death Father    Drug abuse Father    Depression Father    Cancer Father    Arthritis Father    Alcohol abuse Father    Brain cancer Father    Anesthesia problems Father        hard to wake up post-op   Lung cancer Father    Hyperlipidemia Maternal Grandmother    Hypertension Maternal Grandmother    Cancer Maternal Grandmother    Asthma Maternal Grandmother    Arthritis Maternal Grandmother    Breast cancer Maternal Grandmother    Ovarian cancer Maternal Grandmother    Diabetes Maternal Grandmother    Heart disease Maternal Grandmother    Heart attack Maternal Grandmother    Hypertension Maternal Grandfather    Hyperlipidemia Maternal Grandfather     Arthritis Maternal Grandfather    Alcohol abuse Maternal Grandfather    Cancer Maternal Grandfather    Prostate cancer Maternal Grandfather    Melanoma Maternal Grandfather    Cancer - Prostate Maternal Grandfather    Hypertension Paternal Grandmother    COPD Paternal Grandmother    Arthritis Paternal Grandmother    Learning disabilities Daughter    Learning disabilities Son    Colon cancer Neg Hx    Outpatient Medications Prior to Visit  Medication Sig Dispense Refill   ADDERALL XR 20 MG 24 hr capsule Take 20 mg by mouth  every morning.  0   albuterol (PROVENTIL HFA;VENTOLIN HFA) 108 (90 Base) MCG/ACT inhaler Inhale 2 puffs into the lungs every 6 (six) hours as needed for wheezing or shortness of breath.      ALPRAZolam (XANAX) 0.25 MG tablet SMARTSIG:0.5 Tablet(s) By Mouth PRN     Bacillus Coagulans-Inulin (PROBIOTIC) 1-250 BILLION-MG CAPS      cetirizine (ZYRTEC) 10 MG tablet Take by mouth.     EMGALITY 120 MG/ML SOAJ Inject 120 mg into the skin every 30 (thirty) days.  4   fluticasone (FLONASE) 50 MCG/ACT nasal spray Place into the nose.     gabapentin (NEURONTIN) 300 MG capsule TAKE 2 CAPSULES BY MOUTH 3 TIMES DAILY (Patient taking differently: Take 600-1,200 mg by mouth See admin instructions. Take 600mg  by mouth in the morning and 1,200mg  in the evening) 180 capsule 4   LINZESS 290 MCG CAPS capsule Take 290 mcg by mouth daily.     magnesium oxide (MAG-OX) 400 MG tablet Take by mouth.     mirtazapine (REMERON) 30 MG tablet Take 30 mg by mouth daily.     Multiple Vitamins-Minerals (EQL CENTURY WOMENS) TABS      omeprazole (PRILOSEC) 40 MG capsule Take 40 mg by mouth daily.     Polyethylene Glycol 3350 (MIRALAX PO) Take by mouth at bedtime.     promethazine (PHENERGAN) 25 MG tablet Take 1 tablet (25 mg total) by mouth every 8 (eight) hours as needed for nausea or vomiting. 10 tablet 0   Rimegepant Sulfate (NURTEC) 75 MG TBDP Nurtec ODT 75 mg disintegrating tablet  TAKE ONE TABLET BY  MOUTH AS NEEDED. one PER 24 hours     Semaglutide-Weight Management 0.25 MG/0.5ML SOAJ Inject 0.25 mg into the skin once a week for 28 days. 2 mL 0   [START ON 07/16/2023] Semaglutide-Weight Management 0.5 MG/0.5ML SOAJ Inject 0.5 mg into the skin once a week for 28 days. 2 mL 0   [START ON 08/14/2023] Semaglutide-Weight Management 1 MG/0.5ML SOAJ Inject 1 mg into the skin once a week for 28 days. 2 mL 0   [START ON 09/12/2023] Semaglutide-Weight Management 1.7 MG/0.75ML SOAJ Inject 1.7 mg into the skin once a week for 28 days. 3 mL 0   [START ON 10/11/2023] Semaglutide-Weight Management 2.4 MG/0.75ML SOAJ Inject 2.4 mg into the skin once a week for 28 days. 3 mL 0   senna-docusate (SENOKOT-S) 8.6-50 MG tablet Take 1 tablet by mouth daily. 10 tablet 0   tiZANidine (ZANAFLEX) 4 MG tablet Take 4 mg by mouth every 6 (six) hours as needed for muscle spasms.      traZODone (DESYREL) 50 MG tablet at bedtime as needed. Patient reported that she takes half of a tablet.     UBRELVY 100 MG TABS Take by mouth.     venlafaxine XR (EFFEXOR-XR) 150 MG 24 hr capsule Take 2 capsules (300 mg total) by mouth daily before breakfast. 60 capsule 11   Vitamin A 2400 MCG (8000 UT) TABS      Zinc 50 MG TABS      zonisamide (ZONEGRAN) 100 MG capsule Take 100 mg by mouth at bedtime.     beclomethasone (QVAR) 40 MCG/ACT inhaler Inhale into the lungs.     Biotin 10 MG TABS      cyclobenzaprine (FLEXERIL) 10 MG tablet Take 1 tablet by mouth 3 (three) times daily.     diclofenac sodium (VOLTAREN) 1 % GEL APPLY ONE APPLICATION FOUR TIMES DAILY AS NEEDED  levocetirizine (XYZAL) 5 MG tablet Take 5 mg by mouth every evening.     Magnesium 400 MG TABS Take 1,200 mg by mouth at bedtime.      meclizine (ANTIVERT) 25 MG tablet Take 12.5 mg by mouth 3 (three) times daily as needed.     ondansetron (ZOFRAN) 4 MG tablet Take 1 tablet (4 mg total) by mouth every 6 (six) hours as needed for nausea or vomiting. 12 tablet 0    pantoprazole (PROTONIX) 20 MG tablet Take 20 mg by mouth 2 (two) times daily.     traMADol (ULTRAM) 50 MG tablet Take 50 mg by mouth 4 (four) times daily as needed.     atorvastatin (LIPITOR) 40 MG tablet Take 1 tablet (40 mg total) by mouth daily. 90 tablet 3   No facility-administered medications prior to visit.   I attest the medication list was reviewed and reconciled with her in accordance with the requirement. Allergies  Allergen Reactions   Nickel Rash    "any jewelry that's not real"/ rash and itching    Prochlorperazine Other (See Comments)   Nsaids Other (See Comments)    DUE TO HISTORY OF GASTRIC ULCER   Tolmetin Itching and Other (See Comments)    DUE TO HISTORY OF GASTRIC ULCER  DUE TO HISTORY OF GASTRIC ULCER  DUE TO HISTORY OF GASTRIC ULCER, DUE TO HISTORY OF GASTRIC ULCER   Oxybutynin Other (See Comments)    "severe dry mouth"   Adhesive [Tape] Rash   Metoclopramide Hcl Other (See Comments)    RESTLESSNESS/JITTERY   Prochlorperazine Edisylate Other (See Comments)    RESTLESSNESS/JITTERY    Sulfa Antibiotics Rash    Social History   Tobacco Use   Smoking status: Former    Current packs/day: 0.50    Average packs/day: 0.5 packs/day for 15.0 years (7.5 ttl pk-yrs)    Types: Cigarettes   Smokeless tobacco: Never  Vaping Use   Vaping status: Every Day  Substance Use Topics   Alcohol use: Yes    Comment: occasional   Drug use: No    Wt Readings from Last 10 Encounters:  07/13/23 231 lb 3.2 oz (104.9 kg)  06/20/23 224 lb 13.9 oz (102 kg)  06/17/23 224 lb 12.8 oz (102 kg)  09/01/22 194 lb 0.1 oz (88 kg)  07/26/21 194 lb (88 kg)  08/10/19 200 lb (90.7 kg)  04/15/19 209 lb 7 oz (95 kg)  04/05/19 203 lb 4.2 oz (92.2 kg)  04/05/19 210 lb (95.3 kg)  03/18/19 210 lb (95.3 kg)   Lab Results  Component Value Date   HGBA1C 5.8 (A) 07/13/2023   HGBA1C 5.6 11/12/2016       Objective   BP 120/75   Pulse 89   Temp 98.1 F (36.7 C) (Temporal)   Ht 5'  7" (1.702 m)   Wt 231 lb 3.2 oz (104.9 kg)   LMP 08/26/2018   SpO2 99%   BMI 36.21 kg/m  Body mass index is 36.21 kg/m.  Wt Readings from Last 3 Encounters:  07/13/23 231 lb 3.2 oz (104.9 kg)  06/20/23 224 lb 13.9 oz (102 kg)  06/17/23 224 lb 12.8 oz (102 kg)    BP Readings from Last 3 Encounters:  07/14/23 120/75  06/20/23 114/73  06/17/23 101/64    This is a polite, friendly, and genuine person  Physical Exam   HEENT: CARDIOVASCULAR: Normal heart and lung auscultation. EXTREMITIES: Ankle edema and ecchymosis. GENERAL:  NAD, AAO, not ill-appearing  HENT:  NCAT, normal nose, mucous membranes moist.  Tympanic membrane evaluated and normal appearing bilaterally, oropharynx evaluated and normal appearing Left eye erythema, sinuses erythematous with blood present. EYES:  sclera nonicteric, no injection CV:  RRR, no murmurs/rubs/gallops LUNG: CTAB, normal WOB, no audible wheezing or stridor ABD: soft, nondistended, no guarding, no palpable tumor GYN:  expressed a preference to do breast and gynecological exam at another office which  I supported and encouraged explaining the importance of routine gynecological screenings. SKIN: warm, dry, no lesions of concernr ash on elbows suggestive of psoriasis.  NEURO: alert, no focal deficit obvious, articulate speech PSYCH: normal mood, behavior, thought content   _________________________________________________________________________________  Health Maintenance, Female Adopting a healthy lifestyle and getting preventive care are important in promoting health and wellness. Ask your health care provider about: The right schedule for you to have regular tests and exams. Things you can do on your own to prevent diseases and keep yourself healthy. What should I know about diet, weight, and exercise? Eat a healthy diet  Eat a diet that includes plenty of vegetables, fruits, low-fat dairy products, and lean protein. Do not eat a lot of  foods that are high in solid fats, added sugars, or sodium. Maintain a healthy weight Body mass index (BMI) is used to identify weight problems. It estimates body fat based on height and weight. Your health care provider can help determine your BMI and help you achieve or maintain a healthy weight. Get regular exercise Get regular exercise. This is one of the most important things you can do for your health. Most adults should: Exercise for at least 150 minutes each week. The exercise should increase your heart rate and make you sweat (moderate-intensity exercise). Do strengthening exercises at least twice a week. This is in addition to the moderate-intensity exercise. Spend less time sitting. Even light physical activity can be beneficial. Watch cholesterol and blood lipids Have your blood tested for lipids and cholesterol at 42 years of age, then have this test every 5 years. Have your cholesterol levels checked more often if: Your lipid or cholesterol levels are high. You are older than 41 years of age. You are at high risk for heart disease. What should I know about cancer screening? Depending on your health history and family history, you may need to have cancer screening at various ages. This may include screening for: Breast cancer. Cervical cancer. Colorectal cancer. Skin cancer. Lung cancer. What should I know about heart disease, diabetes, and high blood pressure? Blood pressure and heart disease High blood pressure causes heart disease and increases the risk of stroke. This is more likely to develop in people who have high blood pressure readings or are overweight. Have your blood pressure checked: Every 3-5 years if you are 35-26 years of age. Every year if you are 68 years old or older. Diabetes Have regular diabetes screenings. This checks your fasting blood sugar level. Have the screening done: Once every three years after age 4 if you are at a normal weight and have a  low risk for diabetes. More often and at a younger age if you are overweight or have a high risk for diabetes. What should I know about preventing infection? Hepatitis B If you have a higher risk for hepatitis B, you should be screened for this virus. Talk with your health care provider to find out if you are at risk for hepatitis B infection. Hepatitis C Testing is recommended for: Everyone born from [birth year]  through 1964. Anyone with known risk factors for hepatitis C. Sexually transmitted infections (STIs) Get screened for STIs, including gonorrhea and chlamydia, if: You are sexually active and are younger than 41 years of age. You are older than 41 years of age and your health care provider tells you that you are at risk for this type of infection. Your sexual activity has changed since you were last screened, and you are at increased risk for chlamydia or gonorrhea. Ask your health care provider if you are at risk. Ask your health care provider about whether you are at high risk for HIV. Your health care provider may recommend a prescription medicine to help prevent HIV infection. If you choose to take medicine to prevent HIV, you should first get tested for HIV. You should then be tested every 3 months for as long as you are taking the medicine. Pregnancy If you are about to stop having your period (premenopausal) and you may become pregnant, seek counseling before you get pregnant. Take 400 to 800 micrograms (mcg) of folic acid every day if you become pregnant. Ask for birth control (contraception) if you want to prevent pregnancy. Osteoporosis and menopause Osteoporosis is a disease in which the bones lose minerals and strength with aging. This can result in bone fractures. If you are 75 years old or older, or if you are at risk for osteoporosis and fractures, ask your health care provider if you should: Be screened for bone loss. Take a calcium or vitamin D supplement to lower your risk of  fractures. Be given hormone replacement therapy (HRT) to treat symptoms of menopause. Follow these instructions at home: Alcohol use Do not drink alcohol if: Your health care provider tells you not to drink. You are pregnant, may be pregnant, or are planning to become pregnant. If you drink alcohol: Limit how much you have to: 0-1 drink a day. Know how much alcohol is in your drink. In the [place] one drink equals one 12 oz bottle of beer (355 mL), one 5 oz glass of wine (148 mL), or one 1 oz glass of hard liquor (44 mL). Lifestyle Do not use any products that contain nicotine or tobacco. These products include cigarettes, chewing tobacco, and vaping devices, such as e-cigarettes. If you need help quitting, ask your health care provider. Do not use street drugs. Do not share needles. Ask your health care provider for help if you need support or information about quitting drugs. General instructions Schedule regular health, dental, and eye exams. Stay current with your vaccines. Tell your health care provider if: You often feel depressed. You have ever been abused or do not feel safe at home. Summary Adopting a healthy lifestyle and getting preventive care are important in promoting health and wellness. Follow your health care provider's instructions about healthy diet, exercising, and getting tested or screened for diseases. Follow your health care provider's instructions on monitoring your cholesterol and blood pressure. This information is not intended to replace advice given to you by your health care provider. Make sure you discuss any questions you have with your health care provider. Document Revised: 03/25/2021 Document Reviewed: 03/25/2021 Elsevier Patient Education  2024 [employer].

## 2023-07-14 ENCOUNTER — Encounter: Payer: Self-pay | Admitting: Internal Medicine

## 2023-07-14 DIAGNOSIS — R7303 Prediabetes: Secondary | ICD-10-CM | POA: Insufficient documentation

## 2023-07-14 DIAGNOSIS — R638 Other symptoms and signs concerning food and fluid intake: Secondary | ICD-10-CM | POA: Insufficient documentation

## 2023-07-14 DIAGNOSIS — M25473 Effusion, unspecified ankle: Secondary | ICD-10-CM | POA: Insufficient documentation

## 2023-07-14 NOTE — Patient Instructions (Addendum)
VISIT SUMMARY:  During our visit, we discussed your menopausal symptoms, high triglycerides and low HDL levels, prediabetes, chronic ankle swelling, and a small rash on your elbow. We also talked about your weight concerns and your desire to lose weight. We discussed various treatment options and made decisions based on your personal preferences and overall health.  YOUR PLAN:  -MENOPAUSE: Menopause is the time in a woman's life when her period stops. It usually occurs naturally, most often after age 68. We decided to continue with Effexor and Gabapentin to manage your severe hot flashes and night sweats.  -HYPERLIPIDEMIA: Hyperlipidemia is a condition in which there are high levels of fat particles (lipids) in the blood. We will continue with Atorvastatin and recommend dietary changes to manage your high triglycerides and low HDL levels.  -PREDIABETES: Prediabetes means that your blood sugar level is higher than normal but not yet high enough to be type 2 diabetes. We will continue with Semaglutide 0.5mg  for weight loss and check your A1c at the next visit.  -CHRONIC ANKLE SWELLING: Chronic ankle swelling is a condition where the ankles remain swollen for an extended period. We recommend a referral to a vein and vascular specialist for evaluation and potential procedural fix.  -GENERAL HEALTH MAINTENANCE: This includes routine check-ups and lifestyle recommendations to maintain your overall health. We advise you to continue with your annual eye exams, dental visits every six months, nightly sinus rinses, and walking for exercise 1-2 times per week. We also recommend a mammogram and discussion of breast exam at your next gynecology visit, a colonoscopy due to your family history of colon cancer, and a dermatology consultation for your potential psoriasis.  INSTRUCTIONS:  Please schedule your appointments with the vein and vascular specialist, gynecologist, and dermatologist as soon as possible.  Continue taking your medications as prescribed and make the recommended dietary changes. We will check your A1c at your next visit. Your next annual exam is scheduled for one year from now.  Building Your Long-Term Health Plan  During today's preventive visit, we covered a variety of important health checks to help you stay on top of your well-being.  We also discussed strategies to maintain your health and identified some areas that might benefit from further exploration.   Preventive care visits like today's are designed to be proactive, but sometimes additional attention may be needed.  Rest assured, we're here for you.  If these areas require further evaluation or management, we'd be happy to schedule a separate, focused appointment to address them in detail.  Addressing Next Steps  [x]   Follow-up Visit: To ensure we address any unresolved issues and continue monitoring your overall health, we recommend scheduling a follow-up appointment in 1 year for your next preventive care visit. If you experience any new problems, need to discuss any medical concerns, or your condition worsens before then, please don't hesitate to call our office to schedule an appointment or seek emergency care as needed.  [x]   Preventive Measures: Maintaining healthy habits plays a crucial role in overall wellness. We recommend considering these tips: [x]   Regular appointments with dental and vision professionals [x]   Nightly nasal saline mist to keep sinuses clear [x]   Consistent toothbrushing to maintain oral health [x]   Using an app like SnoreLab to track sleep quality [x]   Routine checks of blood pressure and heart rate [x]   Medical Information: In some instances, we may require additional medical information from other providers to create a comprehensive picture of your health.  If applicable, we can provide a medical information release form at the front desk for you to sign, allowing [place] to gather these records. [x]    Lab Tests: If any lab tests were ordered today, scheduling them within a week of your visit helps ensure the best possible insurance coverage.  Planning Follow Up to Work on a Problem? Make the Most of Our Focused (20 minute) Appointments  [x]   Clearly state your top concerns at the beginning of the visit to focus our discussion [x]   If you anticipate you will need more time, please inform the front desk during scheduling - we can book multiple appointments in the same week. [x]   If you have transportation problems- use our convenient video appointments or ask about transportation support. [x]   We can get down to business faster if you use MyChart to update information before the visit and submit non-urgent questions before your visit. Thank you for taking the time to provide details through MyChart.  Let our nurse know and she can import this information into your encounter documents.  Arrival and Wait Times  [x]   Arriving on time ensures that everyone receives prompt attention. [x]   Early morning (8a) and afternoon (1p) appointments tend to have shortest wait times. [x]   Unfortunately, we cannot delay appointments for late arrivals or hold slots during phone calls.  Bring to Your Next Appointment:  [x]   Medications: Please bring all your medication bottles to your next appointment to ensure we have an accurate record of your prescriptions. [x]   Health Diaries: If you're monitoring any health conditions at home, keeping a diary of your readings can be very helpful for discussions at your next appointment.  Reviewing Your Records  [x]   Review your attached preventive care information at the end of these patient instructions. [x]   Review this early draft of your clinical encounter notes below and the final encounter summary tomorrow on MyChart after its been completed.      Getting Answers and Following Up  [x]   Simple Questions & Concerns: For quick questions or basic follow-up after  your visit, reach [place] at [phone] or MyChart messaging. [x]   Complex Concerns: If your concern is more complex, scheduling an appointment might be best. Discuss this with the staff to find the most suitable option. [x]   Lab & Imaging Results: We'll contact you directly if results are abnormal or you don't use MyChart. Most normal results will be on MyChart within 2-3 business days, with a review message from Dr. Jon Billings. Haven't heard back in 2 weeks? Need results sooner? Contact us at [phone]. [x]   Referrals: Our referral coordinator will manage specialist referrals. The specialist's office should contact you within 2 weeks to schedule an appointment. Call us if you haven't heard from them after 2 weeks.  Staying Connected  [x]   MyChart: Activate your MyChart for the fastest way to access results and message [place]. See the last page of this paperwork for instructions on how to activate.  Billing  [x]   X-ray & Lab Orders: These are billed by separate companies. Contact the invoicing company directly for questions or concerns. [x]   Visit Charges: Discuss any billing inquiries with our administrative services team.  Your Satisfaction Matters  [x]   Share Your Experience: We strive for your satisfaction! If you have any complaints, or preferably compliments, please let Dr. Jon Billings know directly or contact our Practice Administrators, Edwena Felty or [employer], by asking at the front desk.  Next Steps  [x]   Schedule Follow-Up:  We recommend a follow-up appointment in 1 year for your next wellness visit.  If you develop any new problems, want to address any medical issues, or your condition worsens before then, please call us for an appointment or seek emergency care. [x]   Preventive Care:  Make sure to keep regular appointments with dental and vision professionals, use nightly nasal saline mist sprays to keep your sinuses clear and toothbrushing to protect  your teeth. Use SnoreLab App or other app to track your sleep quality. Check blood pressure and heart rate routinely. [x]   Medical Information Release:  For any relevant medical information we don't have, please sign a release form at the front desk so we can obtain it for your records. [x]   Lab Tests:  Schedule any lab tests from today for within a week to ensure best insurance coverage.    Making the Most of Our Focused (20 minute) Appointments:  [x]   Clearly state your top concerns at the beginning of the visit to focus our discussion [x]   If you anticipate you will need more time, please inform the front desk during scheduling - we can book multiple appointments in the same week. [x]   If you have transportation problems- use our convenient video appointments or ask about transportation support. [x]   We can get down to business faster if you use MyChart to update information before the visit and submit non-urgent questions before your visit. Thank you for taking the time to provide details through MyChart.  Let our nurse know and she can import this information into your encounter documents.  Arrival and Wait Times: [x]   Arriving on time ensures that everyone receives prompt attention. [x]   Early morning (8a) and afternoon (1p) appointments tend to have shortest wait times. [x]   Unfortunately, we cannot delay appointments for late arrivals or hold slots during phone calls.  Bring to Your Next Appointment  [x]   Medications: Please bring all your medication bottles to your next appointment to ensure we have an accurate record of your prescriptions. [x]   Health Diaries: If you're monitoring any health conditions at home, keeping a diary of your readings can be very helpful for discussions at your next appointment.  Reviewing Your Records  [x]   Review your attached preventive care information at the end of these patient instructions. [x]   Review this early draft of your clinical encounter  notes below and the final encounter summary tomorrow on MyChart after its been completed.   Menopausal syndrome (hot flushes)  Prediabetes -     POCT glycosylated hemoglobin (Hb A1C)  Weight disorder -     Amb Ref to Medical Weight Management  Ankle swelling, unspecified laterality -     Ambulatory referral to Vascular Surgery  Other hyperlipidemia  Encounter for annual general medical examination with abnormal findings in adult  Preventative health care     Getting Answers and Following Up  [x]   Simple Questions & Concerns: For quick questions or basic follow-up after your visit, reach [place] at [phone] or MyChart messaging. [x]   Complex Concerns: If your concern is more complex, scheduling an appointment might be best. Discuss this with the staff to find the most suitable option. [x]   Lab & Imaging Results: We'll contact you directly if results are abnormal or you don't use MyChart. Most normal results will be on MyChart within 2-3 business days, with a review message from Dr. Jon Billings. Haven't heard back in 2 weeks? Need results  sooner? Contact us at [phone]. [x]   Referrals: Our referral coordinator will manage specialist referrals. The specialist's office should contact you within 2 weeks to schedule an appointment. Call us if you haven't heard from them after 2 weeks.  Staying Connected  [x]   MyChart: Activate your MyChart for the fastest way to access results and message [place]. See the last page of this paperwork for instructions on how to activate.  Billing  [x]   X-ray & Lab Orders: These are billed by separate companies. Contact the invoicing company directly for questions or concerns. [x]   Visit Charges: Discuss any billing inquiries with our administrative services team.  Your Satisfaction Matters  [x]   Share Your Experience: We strive for your satisfaction! If you have any complaints, or preferably compliments, please let Dr. Jon Billings know directly or contact  our Practice Administrators, Edwena Felty or [employer], by asking at the front desk.

## 2023-07-14 NOTE — Assessment & Plan Note (Signed)
She presents with high triglycerides and low HDL, alongside a family history of genetic vascular disease. The importance of aggressive management through diet and medication to prevent early heart disease and cerebrovascular disease was emphasized. We will continue Atorvastatin and recommend dietary changes, including increased intake of avocados and fatty fish and avoidance of processed foods.

## 2023-07-14 NOTE — Assessment & Plan Note (Signed)
The risks of chronic fluid pill use, such as kidney damage and severe dehydration, were discussed, leading to a decision against their use. A referral to a vein and vascular specialist for evaluation and potential procedural fix is recommended.

## 2023-07-14 NOTE — Assessment & Plan Note (Signed)
>>  ASSESSMENT AND PLAN FOR WEIGHT DISORDER WRITTEN ON 07/14/2023  6:41 AM BY Shiva Sahagian G, MD  We had discussion of GLP-1 agonist options but without stroke myocardial infarction or diabetes mellitus her insurance won't cover so its not affordable She is not interested generic weight loss medications

## 2023-07-14 NOTE — Assessment & Plan Note (Signed)
Severe hot flashes and night sweats have been addressed, with a discussion on the risks and benefits of estrogen replacement therapy, including an increased risk of endometrial and breast cancer. Given her weight and history of endometrial bleeding, she decided against estrogen replacement. We will continue Effexor and Gabapentin for hot flash management- and gave Veozah samples to try

## 2023-07-14 NOTE — Assessment & Plan Note (Signed)
An A1c of 5.8 indicates prediabetes. The significance of diet and exercise in managing prediabetes and preventing its progression to diabetes was discussed. We will continue Semaglutide 0.5mg  for weight loss and check A1c at the next visit.

## 2023-07-14 NOTE — Assessment & Plan Note (Signed)
We had discussion of GLP-1 agonist options but without stroke myocardial infarction or diabetes mellitus her insurance won't cover so its not affordable She is not interested generic weight loss medications

## 2023-07-15 NOTE — Telephone Encounter (Signed)
Called patient in regards to a different matter when she mentioned she didn't receive any samples of the veozah. States following OV on 8/26, PCP believed he gave her samples but there were none in the box given, just information and other tools for hot flashes.

## 2023-07-23 ENCOUNTER — Other Ambulatory Visit: Payer: Self-pay

## 2023-07-23 ENCOUNTER — Other Ambulatory Visit: Payer: Self-pay | Admitting: *Deleted

## 2023-07-23 DIAGNOSIS — N951 Menopausal and female climacteric states: Secondary | ICD-10-CM

## 2023-07-24 ENCOUNTER — Other Ambulatory Visit: Payer: Self-pay

## 2023-07-24 DIAGNOSIS — N951 Menopausal and female climacteric states: Secondary | ICD-10-CM

## 2023-07-24 MED ORDER — FEZOLINETANT 45 MG PO TABS
45.0000 mg | ORAL_TABLET | Freq: Every day | ORAL | 3 refills | Status: DC
Start: 2023-07-24 — End: 2024-02-02

## 2023-07-25 ENCOUNTER — Other Ambulatory Visit: Payer: Self-pay | Admitting: Cardiovascular Disease

## 2023-08-06 ENCOUNTER — Encounter: Payer: BC Managed Care – PPO | Admitting: Family Medicine

## 2023-08-10 ENCOUNTER — Other Ambulatory Visit (HOSPITAL_COMMUNITY): Payer: Self-pay

## 2023-08-10 ENCOUNTER — Telehealth: Payer: Self-pay

## 2023-08-10 NOTE — Telephone Encounter (Signed)
Pharmacy Patient Advocate Encounter   Received notification from CoverMyMeds that prior authorization for Veozah 45mg  tabs is required/requested.   Insurance verification completed.   The patient is insured through [facility] .   Per test claim: PA required; PA submitted to [facility] via CoverMyMeds Key/confirmation #/[id number] Status is pending

## 2023-08-12 NOTE — Telephone Encounter (Signed)
Pharmacy Patient Advocate Encounter  Received notification from [facility] that Prior Authorization for Veozah 45mg  has been DENIED.  Full denial letter will be uploaded to the media tab. See denial reason below.   PA #/Case ID/Reference #: [id number]

## 2023-08-13 ENCOUNTER — Encounter (INDEPENDENT_AMBULATORY_CARE_PROVIDER_SITE_OTHER): Payer: BC Managed Care – PPO

## 2023-08-13 ENCOUNTER — Encounter: Payer: BC Managed Care – PPO | Admitting: Nurse Practitioner

## 2023-08-13 NOTE — Telephone Encounter (Signed)
Sent my chart message informing patient of this denial and contact information to find out more information.

## 2023-08-18 ENCOUNTER — Other Ambulatory Visit: Payer: Self-pay | Admitting: *Deleted

## 2023-08-18 DIAGNOSIS — M25473 Effusion, unspecified ankle: Secondary | ICD-10-CM

## 2023-08-20 DIAGNOSIS — N951 Menopausal and female climacteric states: Secondary | ICD-10-CM

## 2023-08-20 NOTE — Telephone Encounter (Signed)
Encounter Type: MyChart secure digital messaging clinical encounter  Chief Complaint: Patient seeking alternative treatment options for hot flashes after insurance denial of Veozah (fezolinetant).  Relevant History: - Patient experiencing menopausal hot flashes - Previously prescribed Veozah, which was denied by insurance - Full medical history to be reviewed during upcoming in-person appointment  Assessment: The patient is experiencing persistent hot flashes and requires alternative management strategies due to insurance denial of Veozah. A comprehensive evaluation of treatment options is necessary, considering the patient's full medical history and current medications.  Plan: 1. Provided patient education on alternative treatments for hot flashes, including:    - Non-hormonal medications (antidepressants, gabapentin, oxybutynin)    - Lifestyle modifications    - Natural remedies    - Consideration of hormone therapy if not contraindicated 2. Recommended scheduling an in-person appointment for thorough evaluation and personalized treatment planning 3. Advised on implementing lifestyle modifications in the interim 4. Provided ER precautions for severe symptoms  Patient Education: Educated patient on various treatment options for hot flashes, emphasizing the importance of personalized care and potential risks/benefits of each approach. Explained the need for an in-person evaluation to determine the most appropriate treatment.  Follow-up: Patient advised to schedule an in-person appointment within the next 2-4 weeks for comprehensive evaluation and treatment planning. Will assess efficacy of any implemented lifestyle changes and discuss prescription options based on full medical history review.  Time Documentation:  The time spent on this MyChart encounter includes the following activities:      Reviewing prior medical records and patient history: 5 minutes     Evaluating diagnostic  results and reports (if applicable): 0 minutes     Crafting a contextually relevant, evidence-based, and understandable MyChart message to address patient concerns and provide medical advice: 5 minutes     Updating and documenting the patient's care plan and encounter summary in the electronic health record: 3 minutes     Coordinating and guiding follow-up care planning, including referrals, medication orders, or further diagnostics: 1 minutes  Total time spent on this encounter: 14 minutes  This documentation accurately reflects the time spent in managing and coordinating patient care as part of the Evaluation and Management (E/M) services in compliance with AMA and CMS guidelines.  Medical Decision Making: Moderate complexity of medical decision making was involved in this encounter due to the need to consider multiple treatment options for menopausal symptoms, potential drug interactions, and individualized care planning. The time spent was medically necessary to provide comprehensive patient education, evaluate potential treatment strategies, and ensure patient safety in light of unknown full medical history in this digital encounter format.   Please see the MyChart message reply(ies) for my assessment and plan.    This patient gave consent for this Medical Advice Message and is aware that it may result in a bill to [employer], as well as the possibility of receiving a bill for a co-payment or deductible. They are an established patient, but are not seeking medical advice exclusively about a problem treated during an in person or video visit in the last seven days. I did not recommend an in person or video visit within seven days of my reply.    I spent a total of 14  minutes cumulative time within 7 days through [employer].  Lula Olszewski, MD

## 2023-08-31 NOTE — Progress Notes (Deleted)
Patient name: Diane [name] MRN: [id number] DOB: Mar 23, 1981 Sex: female  REASON FOR CONSULT: ankle swelling  HPI: Diane [name] is a 42 y.o. female, with history of anxiety, GERD, hyperlipidemia that presents for evaluation of ankle swelling.  Past Medical History:  Diagnosis Date   ADD (attention deficit disorder with hyperactivity)    Allergy    Anxiety    Arthritis    Atypical chest pain 07/26/2021   Atypical chest pain     Benign intracranial hypertension 06/16/2008   Qualifier: Diagnosis of   By: Zimonjic, Milica         Choking    due to food and pills get stuck   Cholelithiasis and acute cholecystitis without obstruction 12/02/2016   Chronic cough 12/27/2013   Depression    Dry mouth 08/12/2017   Dysmenorrhea    Family history of adverse reaction to anesthesia    pt's father has hx. of being hard to wake up post-op   Gastric ulcer 03/16/2015   GERD (gastroesophageal reflux disease)    Headache 07/09/2007   Qualifier: Diagnosis of   By: Cato Mulligan MD, Bruce      Replacing diagnoses that were inactivated after the 02/16/23 regulatory import     History of gastric ulcer    History of kidney stones    Hyperlipidemia    IBS (irritable bowel syndrome)    Lower extremity edema 07/26/2021   Lower extremity edema     Migraines    neurologist-  dr c. Sharene Skeans ([facility] in [place])-- treated with Emgality injection every 30 days   Mild obstructive sleep apnea    per study in epic 03-21-2017 mild osa, recommendation cpap, mouth appliance, wt loss   Nausea and vomiting 12/03/2016   OAB (overactive bladder) 08/12/2017   Osteoarthritis    right ankle   Osteochondral lesion 09/2016   right ankle   Osteomyelitis of knee region [facility])    Peripheral vascular disease (HCC)    NERVE DAMAGE RIGHT LEG AND FOOT DUE TO INJURGY.    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   RUQ pain    S/P dilatation of esophageal stricture 07/2018   Sinusitis 08/12/2018   RESOLVED WITH ANTIOBIOTIC   SUI (stress urinary incontinence, female)    Wears contact lenses    Wears partial dentures    upper    Past Surgical History:  Procedure Laterality Date   ANKLE ARTHROSCOPY Right 10/22/2016   Procedure: ANKLE ARTHROSCOPY;  Surgeon: Vivi Barrack, DPM;  Location: [facility];  Service: Podiatry;  Laterality: Right;  GENERAL/REG BLOCK   ANKLE ARTHROSCOPY Right 09-15-2017   @[facility]    BLADDER SURGERY  child   x 2 - as a child to stretch bladder   CESAREAN SECTION  12/24/2009   CESAREAN SECTION  11/20/2011   Procedure: CESAREAN SECTION;  Surgeon: Lenoard Aden, MD;  Location: [facility];  Service: Gynecology;  Laterality: N/A;   CHOLECYSTECTOMY N/A 12/03/2016   Procedure: LAPAROSCOPIC CHOLECYSTECTOMY WITH INTRAOPERATIVE CHOLANGIOGRAM;  Surgeon: Darnell Level, MD;  Location: [facility];  Service: General;  Laterality: N/A;   CYSTOSCOPY WITH RETROGRADE PYELOGRAM, URETEROSCOPY AND STENT PLACEMENT Left 08/10/2006   ESOPHAGOGASTRODUODENOSCOPY (EGD) WITH ESOPHAGEAL DILATION  07/2018   HARDWARE REMOVAL Right 07/01/2016   Procedure: HARDWARE REMOVAL;  Surgeon: Cammy Copa, MD;  Location: [facility];  Service: Orthopedics;  Laterality: Right;   KNEE ARTHROSCOPY WITH LATERAL RELEASE Right 12/13/2014   Procedure: KNEE ARTHROSCOPY WITH LATERAL RELEASE;  Surgeon: Thera Flake., MD;  Location: [facility];  Service: Orthopedics;  Laterality: Right;   LAPAROSCOPIC APPENDECTOMY N/A 06/11/2014   Procedure: APPENDECTOMY LAPAROSCOPIC;  Surgeon: Velora Heckler, MD;  Location: [facility];  Service: General;  Laterality: N/A;   ORIF ANKLE FRACTURE Right 01/05/2016   Procedure: OPEN REDUCTION INTERNAL FIXATION (ORIF) ANKLE FRACTURE;  Surgeon: Cammy Copa, MD;  Location: [facility];  Service: Orthopedics;  Laterality: Right;   OVARIAN CYST SURGERY Right  03/05/2007   AND CHROMOPERTUBATION  VIA LAPAROSCOPY   ROBOTIC ASSISTED LAPAROSCOPIC HYSTERECTOMY AND SALPINGECTOMY Bilateral 08/27/2018   Procedure: XI ROBOTIC ASSISTED LAPAROSCOPIC HYSTERECTOMY AND BILATERAL SALPINGECTOMY;  Surgeon: Olivia Mackie, MD;  Location: [facility];  Service: Gynecology;  Laterality: Bilateral;  [facility] for 23hr OBS   SYNOVECTOMY Right 12/13/2014   Procedure: PLICA SYNOVECTOMY;  Surgeon: Thera Flake., MD;  Location: [facility];  Service: Orthopedics;  Laterality: Right;   TONSILLECTOMY AND ADENOIDECTOMY  child   WISDOM TOOTH EXTRACTION      Family History  Problem Relation Age of Onset   Learning disabilities Mother    Asthma Mother    Arthritis Mother    Irritable bowel syndrome Mother    Miscarriages / [place] Mother    Hypertension Father    Early death Father    Drug abuse Father    Depression Father    Cancer Father    Arthritis Father    Alcohol abuse Father    Brain cancer Father    Anesthesia problems Father        hard to wake up post-op   Lung cancer Father    Hyperlipidemia Maternal Grandmother    Hypertension Maternal Grandmother    Cancer Maternal Grandmother    Asthma Maternal Grandmother    Arthritis Maternal Grandmother    Breast cancer Maternal Grandmother    Ovarian cancer Maternal Grandmother    Diabetes Maternal Grandmother    Heart disease Maternal Grandmother    Heart attack Maternal Grandmother    Hypertension Maternal Grandfather    Hyperlipidemia Maternal Grandfather    Arthritis Maternal Grandfather    Alcohol abuse Maternal Grandfather    Cancer Maternal Grandfather    Prostate cancer Maternal Grandfather    Melanoma Maternal Grandfather    Cancer - Prostate Maternal Grandfather    Hypertension Paternal Grandmother    COPD Paternal Grandmother    Arthritis Paternal Grandmother    Learning disabilities Daughter    Learning disabilities Son    Colon cancer Neg Hx     SOCIAL HISTORY: Social  History   Socioeconomic History   Marital status: Married    Spouse name: Not on file   Number of children: 2   Years of education: Not on file   Highest education level: Not on file  Occupational History   Occupation: Investment banker, 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  Tobacco Use   Smoking status: Former    Current packs/day: 0.50    Average packs/day: 0.5 packs/day for 15.0 years (7.5 ttl pk-yrs)    Types: Cigarettes   Smokeless tobacco: Never  Vaping Use   Vaping status: Every Day  Substance and Sexual Activity   Alcohol use: Yes    Comment: occasional   Drug use: No   Sexual  activity: Yes    Birth control/protection: None, Other-see comments    Comment: partial hysterectomy  Other Topics Concern   Not on file  Social History Narrative   Not on file   Social Determinants of Health   Financial Resource Strain: Not on file  Food Insecurity: Not on file  Transportation Needs: Not on file  Physical Activity: Not on file  Stress: Not on file  Social Connections: Unknown (03/26/2022)   [facility]   Social Network    Social Network: Not on file  Intimate Partner Violence: Unknown (02/19/2022)   Received from [facility]   HITS    Physically Hurt: Not on file    Insult or Talk Down To: Not on file    Threaten Physical Harm: Not on file    Scream or Curse: Not on file    Allergies  Allergen Reactions   Nickel Rash    "any jewelry that's not real"/ rash and itching    Prochlorperazine Other (See Comments)   Nsaids Other (See Comments)    DUE TO HISTORY OF GASTRIC ULCER   Tolmetin Itching and Other (See Comments)    DUE TO HISTORY OF GASTRIC ULCER  DUE TO HISTORY OF GASTRIC ULCER  DUE TO HISTORY OF GASTRIC ULCER, DUE TO HISTORY OF GASTRIC ULCER   Oxybutynin Other (See Comments)    "severe dry mouth"   Adhesive [Tape] Rash   Metoclopramide Hcl Other (See Comments)    RESTLESSNESS/JITTERY    Prochlorperazine Edisylate Other (See Comments)    RESTLESSNESS/JITTERY    Sulfa Antibiotics Rash    Current Outpatient Medications  Medication Sig Dispense Refill   Fezolinetant 45 MG TABS Take 1 tablet (45 mg total) by mouth daily. Can be taken with or without food. 90 tablet 3   ADDERALL XR 20 MG 24 hr capsule Take 20 mg by mouth every morning.  0   albuterol (PROVENTIL HFA;VENTOLIN HFA) 108 (90 Base) MCG/ACT inhaler Inhale 2 puffs into the lungs every 6 (six) hours as needed for wheezing or shortness of breath.      ALPRAZolam (XANAX) 0.25 MG tablet SMARTSIG:0.5 Tablet(s) By Mouth PRN     atorvastatin (LIPITOR) 40 MG tablet Take 1 tablet (40 mg total) by mouth daily. 60 tablet 0   Bacillus Coagulans-Inulin (PROBIOTIC) 1-250 BILLION-MG CAPS      cetirizine (ZYRTEC) 10 MG tablet Take by mouth.     EMGALITY 120 MG/ML SOAJ Inject 120 mg into the skin every 30 (thirty) days.  4   fluticasone (FLONASE) 50 MCG/ACT nasal spray Place into the nose.     gabapentin (NEURONTIN) 300 MG capsule TAKE 2 CAPSULES BY MOUTH 3 TIMES DAILY (Patient taking differently: Take 600-1,200 mg by mouth See admin instructions. Take 600mg  by mouth in the morning and 1,200mg  in the evening) 180 capsule 4   LINZESS 290 MCG CAPS capsule Take 290 mcg by mouth daily.     magnesium oxide (MAG-OX) 400 MG tablet Take by mouth.     mirtazapine (REMERON) 30 MG tablet Take 30 mg by mouth daily.     Multiple Vitamins-Minerals (EQL CENTURY WOMENS) TABS      omeprazole (PRILOSEC) 40 MG capsule Take 40 mg by mouth daily.     Polyethylene Glycol 3350 (MIRALAX PO) Take by mouth at bedtime.     promethazine (PHENERGAN) 25 MG tablet Take 1 tablet (25 mg total) by mouth every 8 (eight) hours as needed for nausea or vomiting.  10 tablet 0   Rimegepant Sulfate (NURTEC) 75 MG TBDP Nurtec ODT 75 mg disintegrating tablet  TAKE ONE TABLET BY MOUTH AS NEEDED. one PER 24 hours     Semaglutide-Weight Management 1 MG/0.5ML SOAJ Inject 1 mg into  the skin once a week for 28 days. 2 mL 0   [START ON 09/12/2023] Semaglutide-Weight Management 1.7 MG/0.75ML SOAJ Inject 1.7 mg into the skin once a week for 28 days. 3 mL 0   [START ON 10/11/2023] Semaglutide-Weight Management 2.4 MG/0.75ML SOAJ Inject 2.4 mg into the skin once a week for 28 days. 3 mL 0   senna-docusate (SENOKOT-S) 8.6-50 MG tablet Take 1 tablet by mouth daily. 10 tablet 0   tiZANidine (ZANAFLEX) 4 MG tablet Take 4 mg by mouth every 6 (six) hours as needed for muscle spasms.      traZODone (DESYREL) 50 MG tablet at bedtime as needed. Patient reported that she takes half of a tablet.     UBRELVY 100 MG TABS Take by mouth.     venlafaxine XR (EFFEXOR-XR) 150 MG 24 hr capsule Take 2 capsules (300 mg total) by mouth daily before breakfast. 60 capsule 11   Vitamin A 2400 MCG (8000 UT) TABS      Zinc 50 MG TABS      zonisamide (ZONEGRAN) 100 MG capsule Take 100 mg by mouth at bedtime.     No current facility-administered medications for this visit.    REVIEW OF SYSTEMS:  [X]  denotes positive finding, [ ]  denotes negative finding Cardiac  Comments:  Chest pain or chest pressure: ***   Shortness of breath upon exertion:    Short of breath when lying flat:    Irregular heart rhythm:        Vascular    Pain in calf, thigh, or hip brought on by ambulation:    Pain in feet at night that wakes you up from your sleep:     Blood clot in your veins:    Leg swelling:         Pulmonary    Oxygen at home:    Productive cough:     Wheezing:         Neurologic    Sudden weakness in arms or legs:     Sudden numbness in arms or legs:     Sudden onset of difficulty speaking or slurred speech:    Temporary loss of vision in one eye:     Problems with dizziness:         Gastrointestinal    Blood in stool:     Vomited blood:         Genitourinary    Burning when urinating:     Blood in urine:        Psychiatric    Major depression:         Hematologic    Bleeding  problems:    Problems with blood clotting too easily:        Skin    Rashes or ulcers:        Constitutional    Fever or chills:      PHYSICAL EXAM: There were no vitals filed for this visit.  GENERAL: The patient is a well-nourished female, in no acute distress. The vital signs are documented above. CARDIAC: There is a regular rate and rhythm.  VASCULAR: *** PULMONARY: There is good air exchange bilaterally without wheezing or rales. ABDOMEN: Soft and non-tender with normal pitched bowel sounds.  MUSCULOSKELETAL: There are no major deformities or cyanosis. NEUROLOGIC: No focal weakness or paresthesias are detected. SKIN: There are no ulcers or rashes noted. PSYCHIATRIC: The patient has a normal affect.  DATA:   ***  Assessment/Plan:   43 y.o. female, with history of anxiety, GERD, hyperlipidemia that presents for evaluation of ankle swelling.   Cephus Shelling, MD [facility] Office: [phone]

## 2023-09-01 ENCOUNTER — Encounter: Payer: BC Managed Care – PPO | Admitting: Vascular Surgery

## 2023-09-01 ENCOUNTER — Ambulatory Visit (HOSPITAL_COMMUNITY): Payer: BC Managed Care – PPO | Attending: Vascular Surgery

## 2023-09-09 ENCOUNTER — Ambulatory Visit (INDEPENDENT_AMBULATORY_CARE_PROVIDER_SITE_OTHER): Payer: BC Managed Care – PPO | Admitting: Internal Medicine

## 2023-09-09 VITALS — Ht 67.0 in | Wt 233.0 lb

## 2023-09-09 DIAGNOSIS — R6889 Other general symptoms and signs: Secondary | ICD-10-CM | POA: Diagnosis not present

## 2023-09-09 DIAGNOSIS — R52 Pain, unspecified: Secondary | ICD-10-CM

## 2023-09-09 DIAGNOSIS — R0981 Nasal congestion: Secondary | ICD-10-CM

## 2023-09-09 DIAGNOSIS — H66005 Acute suppurative otitis media without spontaneous rupture of ear drum, recurrent, left ear: Secondary | ICD-10-CM

## 2023-09-09 DIAGNOSIS — J029 Acute pharyngitis, unspecified: Secondary | ICD-10-CM

## 2023-09-09 LAB — POC COVID19 BINAXNOW: SARS Coronavirus 2 Ag: NEGATIVE

## 2023-09-09 LAB — POCT INFLUENZA A/B
Influenza A, POC: NEGATIVE
Influenza B, POC: NEGATIVE

## 2023-09-09 LAB — POCT RAPID STREP A (OFFICE): Rapid Strep A Screen: POSITIVE — AB

## 2023-09-09 MED ORDER — AMOXICILLIN-POT CLAVULANATE 875-125 MG PO TABS
1.0000 | ORAL_TABLET | Freq: Two times a day (BID) | ORAL | 0 refills | Status: DC
Start: 2023-09-09 — End: 2024-01-11

## 2023-09-09 MED ORDER — PSEUDOEPHEDRINE HCL ER 120 MG PO TB12: 120.0000 mg | ORAL_TABLET | Freq: Two times a day (BID) | ORAL | 0 refills | Status: DC

## 2023-09-09 MED ORDER — FLUTICASONE PROPIONATE 50 MCG/ACT NA SUSP
2.0000 | Freq: Every day | NASAL | 6 refills | Status: AC
Start: 2023-09-09 — End: ?

## 2023-09-09 MED ORDER — LORATADINE 10 MG PO TABS: 10.0000 mg | ORAL_TABLET | Freq: Every day | ORAL | 11 refills | Status: DC

## 2023-09-09 MED ORDER — SIMPLY SALINE 0.9 % NA AERS: 2.0000 | INHALATION_SPRAY | NASAL | 11 refills | Status: DC

## 2023-09-09 NOTE — Patient Instructions (Signed)
VISIT SUMMARY:  You came in today with severe ear pain, sinus pressure, and congestion that started yesterday. You also mentioned a slight loss of taste and some body aches. Given your recent history of strep throat, we are considering a possible strep infection in your ear and are also testing for COVID-19 and the flu.  YOUR PLAN:  -ACUTE SINUSITIS AND OTITIS MEDIA: Acute sinusitis is an infection or inflammation of the sinuses, and otitis media is an infection of the middle ear. You have severe sinus congestion and ear pain, especially on the left side, which may be related to your recent strep throat. We are prescribing Augmentin for the ear infection and recommending you continue using Flonase for sinus congestion. For additional relief, you can take Sudafed, Claritin, and Xanrinces.  -POSSIBLE COVID-19: Given your symptoms of loss of taste and mild body aches, we are testing for COVID-19 to rule out an infection. We are also repeating the strep test and conducting a flu test due to your fever. We will follow up with you once we have the test results.  INSTRUCTIONS:  We will follow up with you once we have the results of your COVID-19, strep, and flu tests. If you test positive for COVID-19, we may need to adjust your treatment plan.

## 2023-09-09 NOTE — Progress Notes (Signed)
[facility]: [phone]   -- Medical Office Visit --  Patient:  Diane [name]      Age: 42 y.o.       Sex:  female  Date:   09/09/2023 Patient Care Team: Lula Olszewski, MD as PCP - General (Internal Medicine) Olivia Mackie, MD (Obstetrics and Gynecology) Annia Belt, MD as Referring Physician (Psychiatry) Today's Healthcare Provider: Lula Olszewski, MD   Assessment   Plan      Assessment & Plan Recurrent acute suppurative otitis media without spontaneous rupture of left tympanic membrane  Sore throat  Body aches  Flu-like symptoms  Nasal congestion  Assessment and Plan    Acute Sinusitis and Otitis Media   Severe sinus congestion and ear pain, more pronounced on the left side, follow a recent history of strep throat, indicating a possible strep infection in the ear. We will prescribe Augmentin for the ear infection and continue Flonase for sinus congestion. For symptomatic relief, we recommend Sudafed, Claritin, and Xanrinces.  Possible COVID-19   She reports loss of taste and mild body aches after recent exposure to strep throat, raising concerns for a possible COVID-19 infection. We will order a COVID-19 test, a repeat strep test, and a flu test due to the presence of fever.  Follow-up   We will follow up pending the results of the COVID-19, strep, and flu tests. If she tests positive for COVID-19, we may need to adjust the treatment plan.   Establish for COVID negative. Faint positive strep. augmentin will cover.  Diagnoses and all orders for this visit: Recurrent acute suppurative otitis media without spontaneous rupture of left tympanic membrane -     fluticasone (FLONASE) 50 MCG/ACT nasal spray; Place 2 sprays into both nostrils daily. -     Saline (SIMPLY SALINE) 0.9 % AERS; Place 2 each into the nose as directed. Use nightly for sinus hygiene long-term.  Can also be used as many times daily as desired to assist with clearing  congested sinuses. -     loratadine (CLARITIN) 10 MG tablet; Take 1 tablet (10 mg total) by mouth daily. -     pseudoephedrine (SUDAFED 12 HOUR) 120 MG 12 hr tablet; Take 1 tablet (120 mg total) by mouth 2 (two) times daily. -     amoxicillin-clavulanate (AUGMENTIN) 875-125 MG tablet; Take 1 tablet by mouth 2 (two) times daily. Sore throat -     POCT rapid strep A Body aches Flu-like symptoms -     POCT Influenza A/B Nasal congestion -     POC COVID-19   Subjective   42 y.o. female who has DEPRESSION, ACUTE; Anxiety state; Opioid use disorder in remission; back pain, chronic; Knee pain, right, chronic; Ankle pain, chronic; OSA (obstructive sleep apnea); Chronic migraine without aura; Osteochondral lesion; Dysphagia; Intractable pain; Chronic constipation; Other idiopathic scoliosis, lumbar region; Obesity (BMI 30-39.9); Hyperlipidemia; Menopausal syndrome (hot flushes); Prediabetes; Weight disorder; and Ankle swelling on their problem list.. Their main reasons/main concerns/chief complaints for today's office visit are Ear Pain, Nasal Congestion, and URI   ------------------------------------------------------------------------------------------------------------------------ AI-Extracted: Discussed the use of AI scribe software for clinical note transcription with the patient, who gave verbal consent to proceed.  History of Present Illness   The patient presents with severe ear pain, sinus pressure, and congestion, which began the day prior to the consultation. She describes the sensation as if her head is going to explode and reports that her ears are causing the most discomfort, particularly the  left one. She also mentions waking up in sweats, but notes that this is a common occurrence for her, making it difficult to discern if it is related to her current illness.  The patient has been using Flonase to manage her symptoms but reports being extremely congested, with minimal nasal discharge.  She also mentions a recent bout of strep throat, which was diagnosed and treated two weeks prior to the current consultation. The patient was asymptomatic for strep throat at the time of diagnosis, presenting instead with gastrointestinal issues.  In addition to her ear and sinus symptoms, the patient reports a slight loss of taste and some body aches. She has a history of recurrent acute ear infections, with the most recent one likely due to the strep throat infection. The patient is concerned about the possibility of having contracted COVID-19.       Note that patient  has a past medical history of ADD (attention deficit disorder with hyperactivity), Allergy, Anxiety, Arthritis, Atypical chest pain (07/26/2021), Benign intracranial hypertension (06/16/2008), Choking, Cholelithiasis and acute cholecystitis without obstruction (12/02/2016), Chronic cough (12/27/2013), Depression, Dry mouth (08/12/2017), Dysmenorrhea, Family history of adverse reaction to anesthesia, Gastric ulcer (03/16/2015), GERD (gastroesophageal reflux disease), Headache (07/09/2007), History of gastric ulcer, History of kidney stones, Hyperlipidemia, IBS (irritable bowel syndrome), Lower extremity edema (07/26/2021), Migraines, Mild obstructive sleep apnea, Nausea and vomiting (12/03/2016), OAB (overactive bladder) (08/12/2017), Osteoarthritis, Osteochondral lesion (09/2016), Osteomyelitis of knee region [facility]), Peripheral vascular disease (HCC), Plantar fasciitis of left foot, Pleural effusion (12/15/2013), PONV (postoperative nausea and vomiting), Pseudotumor cerebri, RUQ pain, S/P dilatation of esophageal stricture (07/2018), Sinusitis (08/12/2018), SUI (stress urinary incontinence, female), Wears contact lenses, and Wears partial dentures.  Problem list overviews that were updated at today's visit: No problems updated. Current Outpatient Medications on File Prior to Visit  Medication Sig   Fezolinetant 45 MG TABS Take 1 tablet (45  mg total) by mouth daily. Can be taken with or without food.   ADDERALL XR 20 MG 24 hr capsule Take 20 mg by mouth every morning.   albuterol (PROVENTIL HFA;VENTOLIN HFA) 108 (90 Base) MCG/ACT inhaler Inhale 2 puffs into the lungs every 6 (six) hours as needed for wheezing or shortness of breath.    ALPRAZolam (XANAX) 0.25 MG tablet SMARTSIG:0.5 Tablet(s) By Mouth PRN   atorvastatin (LIPITOR) 40 MG tablet Take 1 tablet (40 mg total) by mouth daily.   Bacillus Coagulans-Inulin (PROBIOTIC) 1-250 BILLION-MG CAPS    cetirizine (ZYRTEC) 10 MG tablet Take by mouth.   EMGALITY 120 MG/ML SOAJ Inject 120 mg into the skin every 30 (thirty) days.   fluticasone (FLONASE) 50 MCG/ACT nasal spray Place into the nose.   gabapentin (NEURONTIN) 300 MG capsule TAKE 2 CAPSULES BY MOUTH 3 TIMES DAILY (Patient taking differently: Take 600-1,200 mg by mouth See admin instructions. Take 600mg  by mouth in the morning and 1,200mg  in the evening)   LINZESS 290 MCG CAPS capsule Take 290 mcg by mouth daily.   magnesium oxide (MAG-OX) 400 MG tablet Take by mouth.   mirtazapine (REMERON) 30 MG tablet Take 30 mg by mouth daily.   Multiple Vitamins-Minerals (EQL [facility] WOMENS) TABS    omeprazole (PRILOSEC) 40 MG capsule Take 40 mg by mouth daily.   Polyethylene Glycol 3350 (MIRALAX PO) Take by mouth at bedtime.   promethazine (PHENERGAN) 25 MG tablet Take 1 tablet (25 mg total) by mouth every 8 (eight) hours as needed for nausea or vomiting.   Rimegepant Sulfate (NURTEC) 75 MG TBDP Nurtec  ODT 75 mg disintegrating tablet  TAKE ONE TABLET BY MOUTH AS NEEDED. one PER 24 hours   Semaglutide-Weight Management 1 MG/0.5ML SOAJ Inject 1 mg into the skin once a week for 28 days.   [START ON 09/12/2023] Semaglutide-Weight Management 1.7 MG/0.75ML SOAJ Inject 1.7 mg into the skin once a week for 28 days.   [START ON 10/11/2023] Semaglutide-Weight Management 2.4 MG/0.75ML SOAJ Inject 2.4 mg into the skin once a week for 28 days.    senna-docusate (SENOKOT-S) 8.6-50 MG tablet Take 1 tablet by mouth daily.   tiZANidine (ZANAFLEX) 4 MG tablet Take 4 mg by mouth every 6 (six) hours as needed for muscle spasms.    traZODone (DESYREL) 50 MG tablet at bedtime as needed. Patient reported that she takes half of a tablet.   UBRELVY 100 MG TABS Take by mouth.   venlafaxine XR (EFFEXOR-XR) 150 MG 24 hr capsule Take 2 capsules (300 mg total) by mouth daily before breakfast.   Vitamin A 2400 MCG (8000 UT) TABS    Zinc 50 MG TABS    zonisamide (ZONEGRAN) 100 MG capsule Take 100 mg by mouth at bedtime.   No current facility-administered medications on file prior to visit.  There are no discontinued medications.   Objective   Physical Exam  Ht 5\' 7"  (1.702 m)   Wt 233 lb (105.7 kg)   LMP 08/26/2018   BMI 36.49 kg/m  Wt Readings from Last 10 Encounters:  09/09/23 233 lb (105.7 kg)  07/13/23 231 lb 3.2 oz (104.9 kg)  06/20/23 224 lb 13.9 oz (102 kg)  06/17/23 224 lb 12.8 oz (102 kg)  09/01/22 194 lb 0.1 oz (88 kg)  07/26/21 194 lb (88 kg)  08/10/19 200 lb (90.7 kg)  04/15/19 209 lb 7 oz (95 kg)  04/05/19 203 lb 4.2 oz (92.2 kg)  04/05/19 210 lb (95.3 kg)  Vital signs reviewed.  Nursing notes reviewed. Weight trend reviewed. Abnormalities and Problem-Specific physical exam findings:   lungs clear, normal work of breathing, oropharynx clear, but lots of congestion and cough and pus behind both ears. General Appearance:  No acute distress appreciable.   Well-groomed, healthy-appearing female.  Well proportioned with no abnormal fat distribution.  Good muscle tone. Pulmonary:  Normal work of breathing at rest, no respiratory distress apparent.    Musculoskeletal: All extremities are intact.  Neurological:  Awake, alert, oriented, and engaged.  No obvious focal neurological deficits or cognitive impairments.  Sensorium seems unclouded.   Speech is clear and coherent with logical content. Psychiatric:  Appropriate mood, pleasant and  cooperative demeanor, thoughtful and engaged during the exam  Results   LABS Strep test: positive (08/25/2023)     Last CBC Lab Results  Component Value Date   WBC 4.8 06/20/2023   HGB 11.6 (L) 06/20/2023   HCT 35.3 (L) 06/20/2023   MCV 84.0 06/20/2023   MCH 27.6 06/20/2023   RDW 13.8 06/20/2023   PLT 263 06/20/2023       Results for orders placed or performed in visit on 09/09/23  POCT rapid strep A  Result Value Ref Range   Rapid Strep A Screen Positive (A) Negative  POCT Influenza A/B  Result Value Ref Range   Influenza A, POC Negative Negative   Influenza B, POC Negative Negative  POC COVID-19  Result Value Ref Range   SARS Coronavirus 2 Ag Negative Negative    Office Visit on 09/09/2023  Component Date Value   Rapid Strep A Screen 09/09/2023  Positive (A)    Influenza A, POC 09/09/2023 Negative    Influenza B, POC 09/09/2023 Negative    SARS Coronavirus 2 Ag 09/09/2023 Negative   Office Visit on 07/13/2023  Component Date Value   Hemoglobin A1C 07/13/2023 5.8 (A)   Admission on 06/20/2023, Discharged on 06/20/2023  Component Date Value   Lipase 06/20/2023 26    Sodium 06/20/2023 138    Potassium 06/20/2023 3.7    Chloride 06/20/2023 103    CO2 06/20/2023 27    Glucose, Bld 06/20/2023 119 (H)    BUN 06/20/2023 10    Creatinine, Ser 06/20/2023 0.75    Calcium 06/20/2023 9.4    Total Protein 06/20/2023 7.3    Albumin 06/20/2023 4.0    AST 06/20/2023 28    ALT 06/20/2023 23    Alkaline Phosphatase 06/20/2023 108    Total Bilirubin 06/20/2023 0.3    GFR, Estimated 06/20/2023 >60    Anion gap 06/20/2023 8    WBC 06/20/2023 4.8    RBC 06/20/2023 4.20    Hemoglobin 06/20/2023 11.6 (L)    HCT 06/20/2023 35.3 (L)    MCV 06/20/2023 84.0    MCH 06/20/2023 27.6    MCHC 06/20/2023 32.9    RDW 06/20/2023 13.8    Platelets 06/20/2023 263    nRBC 06/20/2023 0.0    Color, Urine 06/20/2023 YELLOW    APPearance 06/20/2023 CLEAR    Specific Gravity, Urine  06/20/2023 >1.046 (H)    pH 06/20/2023 7.5    Glucose, UA 06/20/2023 NEGATIVE    Hgb urine dipstick 06/20/2023 NEGATIVE    Bilirubin Urine 06/20/2023 NEGATIVE    Ketones, ur 06/20/2023 NEGATIVE    Protein, ur 06/20/2023 TRACE (A)    Nitrite 06/20/2023 NEGATIVE    Leukocytes,Ua 06/20/2023 NEGATIVE   Office Visit on 06/17/2023  Component Date Value   Hepatitis C Ab 06/17/2023 NON-REACTIVE    WBC 06/17/2023 6.0    RBC 06/17/2023 4.50    Platelets 06/17/2023 300.0    Hemoglobin 06/17/2023 12.3    HCT 06/17/2023 37.6    MCV 06/17/2023 83.7    MCHC 06/17/2023 32.6    RDW 06/17/2023 14.1    Sodium 06/17/2023 138    Potassium 06/17/2023 4.3    Chloride 06/17/2023 104    CO2 06/17/2023 26    Glucose, Bld 06/17/2023 89    BUN 06/17/2023 13    Creatinine, Ser 06/17/2023 0.84    Total Bilirubin 06/17/2023 0.3    Alkaline Phosphatase 06/17/2023 119 (H)    AST 06/17/2023 30    ALT 06/17/2023 27    Total Protein 06/17/2023 7.8    Albumin 06/17/2023 4.3    GFR 06/17/2023 86.68    Calcium 06/17/2023 9.4    Cholesterol 06/17/2023 157    Triglycerides 06/17/2023 184.0 (H)    HDL 06/17/2023 37.20 (L)    VLDL 06/17/2023 36.8    LDL Cholesterol 06/17/2023 83    Total CHOL/HDL Ratio 06/17/2023 4    NonHDL 06/17/2023 120.15    HIV 1&2 Ab, 4th Generati* 06/17/2023 NON-REACTIVE    TSH 06/17/2023 1.62    No image results found.   DG Knee 1-2 Views Right  Result Date: 06/20/2023 CLINICAL DATA:  Chronic right knee pain and swelling. History of prior arthroscopy. EXAM: RIGHT KNEE - 1-2 VIEW COMPARISON:  Right knee x-rays dated May 01, 2016. FINDINGS: No evidence of fracture, dislocation, or joint effusion. No evidence of arthropathy or other focal bone abnormality. Soft tissues are unremarkable. IMPRESSION: Negative. Electronically  Signed   By: Obie Dredge M.D.   On: 06/20/2023 16:12   DG Ankle Complete Right  Result Date: 06/20/2023 CLINICAL DATA:  Chronic right ankle pain.  History of  prior surgery. EXAM: RIGHT ANKLE - COMPLETE 3+ VIEW COMPARISON:  Right ankle x-rays dated January 06, 2017. FINDINGS: No acute fracture or dislocation. Healed trimalleolar fracture with evidence of prior hardware in the medial malleolus and distal fibula status post removal. The ankle mortise is symmetric. The talar dome is intact. Similar mild tibiotalar degenerative changes with small joint effusion. Bone mineralization is normal. Soft tissues are unremarkable. IMPRESSION: 1. Healed trimalleolar fracture with mild post-traumatic tibiotalar osteoarthritis, similar to prior. Electronically Signed   By: Obie Dredge M.D.   On: 06/20/2023 16:11   CT ABDOMEN PELVIS W CONTRAST  Result Date: 06/20/2023 CLINICAL DATA:  Worsening constipation and bloating over the past 8 days. EXAM: CT ABDOMEN AND PELVIS WITH CONTRAST TECHNIQUE: Multidetector CT imaging of the abdomen and pelvis was performed using the standard protocol following bolus administration of intravenous contrast. RADIATION DOSE REDUCTION: This exam was performed according to the departmental dose-optimization program which includes automated exposure control, adjustment of the mA and/or kV according to patient size and/or use of iterative reconstruction technique. CONTRAST:  OMNIPAQUE IOHEXOL 300 MG/ML  SOLN COMPARISON:  CT abdomen pelvis dated July 13, 2020. FINDINGS: Lower chest: No acute abnormality. Hepatobiliary: No focal liver abnormality is seen. Status post cholecystectomy. No biliary dilatation. Pancreas: Unremarkable. No pancreatic ductal dilatation or surrounding inflammatory changes. Spleen: Normal in size without focal abnormality. Adrenals/Urinary Tract: Adrenal glands are unremarkable. Kidneys are normal, without renal calculi, focal lesion, or hydronephrosis. Bladder is decompressed. Stomach/Bowel: Stomach is within normal limits. Prior appendectomy. No evidence of bowel wall thickening, distention, or inflammatory changes. Mild  diffusely increased colonic stool burden. Vascular/Lymphatic: No significant vascular findings are present. No enlarged abdominal or pelvic lymph nodes. Reproductive: Status post hysterectomy. No adnexal masses. Other: No free fluid or pneumoperitoneum. Musculoskeletal: No acute or significant osseous findings. IMPRESSION: 1. No acute intra-abdominal process. Electronically Signed   By: Obie Dredge M.D.   On: 06/20/2023 16:07   DG Lumbar Spine Complete  Result Date: 06/19/2023 CLINICAL DATA:  Chronic low back pain. EXAM: LUMBAR SPINE - COMPLETE 4+ VIEW COMPARISON:  CT abdomen pelvis 07/13/2020 FINDINGS: Mild degenerative disc disease most pronounced L3-4 and L4-5. Lower lumbar spine facet degenerative changes. Normal anatomic alignment. SI joints are unremarkable. IMPRESSION: Lower lumbar spine degenerative disc and facet disease. Electronically Signed   By: Annia Belt M.D.   On: 06/19/2023 23:30    CT ABDOMEN PELVIS W CONTRAST  Result Date: 06/20/2023 CLINICAL DATA:  Worsening constipation and bloating over the past 8 days. EXAM: CT ABDOMEN AND PELVIS WITH CONTRAST TECHNIQUE: Multidetector CT imaging of the abdomen and pelvis was performed using the standard protocol following bolus administration of intravenous contrast. RADIATION DOSE REDUCTION: This exam was performed according to the departmental dose-optimization program which includes automated exposure control, adjustment of the mA and/or kV according to patient size and/or use of iterative reconstruction technique. CONTRAST:  OMNIPAQUE IOHEXOL 300 MG/ML  SOLN COMPARISON:  CT abdomen pelvis dated July 13, 2020. FINDINGS: Lower chest: No acute abnormality. Hepatobiliary: No focal liver abnormality is seen. Status post cholecystectomy. No biliary dilatation. Pancreas: Unremarkable. No pancreatic ductal dilatation or surrounding inflammatory changes. Spleen: Normal in size without focal abnormality. Adrenals/Urinary Tract: Adrenal glands are  unremarkable. Kidneys are normal, without renal calculi, focal lesion, or hydronephrosis.  Bladder is decompressed. Stomach/Bowel: Stomach is within normal limits. Prior appendectomy. No evidence of bowel wall thickening, distention, or inflammatory changes. Mild diffusely increased colonic stool burden. Vascular/Lymphatic: No significant vascular findings are present. No enlarged abdominal or pelvic lymph nodes. Reproductive: Status post hysterectomy. No adnexal masses. Other: No free fluid or pneumoperitoneum. Musculoskeletal: No acute or significant osseous findings. IMPRESSION: 1. No acute intra-abdominal process. Electronically Signed   By: Obie Dredge M.D.   On: 06/20/2023 16:07       Additional Info: This encounter employed real-time, collaborative documentation. The patient actively reviewed and updated their medical record on a shared screen, ensuring transparency and facilitating joint problem-solving for the problem list, overview, and plan. This approach promotes accurate, informed care. The treatment plan was discussed and reviewed in detail, including medication safety, potential side effects, and all patient questions. We confirmed understanding and comfort with the plan. Follow-up instructions were established, including contacting the office for any concerns, returning if symptoms worsen, persist, or new symptoms develop, and precautions for potential emergency department visits.

## 2023-09-18 ENCOUNTER — Ambulatory Visit (HOSPITAL_COMMUNITY): Payer: BC Managed Care – PPO

## 2023-09-30 ENCOUNTER — Other Ambulatory Visit: Payer: Self-pay | Admitting: Cardiovascular Disease

## 2023-10-01 ENCOUNTER — Ambulatory Visit (HOSPITAL_COMMUNITY): Payer: BC Managed Care – PPO

## 2023-10-06 ENCOUNTER — Encounter: Payer: BC Managed Care – PPO | Admitting: Vascular Surgery

## 2023-11-02 ENCOUNTER — Ambulatory Visit (HOSPITAL_COMMUNITY): Payer: BC Managed Care – PPO

## 2023-11-21 ENCOUNTER — Emergency Department (HOSPITAL_BASED_OUTPATIENT_CLINIC_OR_DEPARTMENT_OTHER): Payer: BC Managed Care – PPO | Admitting: Radiology

## 2023-11-21 ENCOUNTER — Emergency Department (HOSPITAL_BASED_OUTPATIENT_CLINIC_OR_DEPARTMENT_OTHER): Payer: BC Managed Care – PPO

## 2023-11-21 ENCOUNTER — Encounter (HOSPITAL_BASED_OUTPATIENT_CLINIC_OR_DEPARTMENT_OTHER): Payer: Self-pay | Admitting: Urology

## 2023-11-21 ENCOUNTER — Emergency Department (HOSPITAL_BASED_OUTPATIENT_CLINIC_OR_DEPARTMENT_OTHER)
Admission: EM | Admit: 2023-11-21 | Discharge: 2023-11-21 | Disposition: A | Discharge status: Home / Self Care | Payer: BC Managed Care – PPO | Attending: Emergency Medicine | Admitting: Emergency Medicine

## 2023-11-21 DIAGNOSIS — M25532 Pain in left wrist: Secondary | ICD-10-CM | POA: Diagnosis not present

## 2023-11-21 DIAGNOSIS — M25562 Pain in left knee: Secondary | ICD-10-CM

## 2023-11-21 DIAGNOSIS — W01198A Fall on same level from slipping, tripping and stumbling with subsequent striking against other object, initial encounter: Secondary | ICD-10-CM | POA: Insufficient documentation

## 2023-11-21 DIAGNOSIS — S069X9A Unspecified intracranial injury with loss of consciousness of unspecified duration, initial encounter: Secondary | ICD-10-CM

## 2023-11-21 DIAGNOSIS — S0990XA Unspecified injury of head, initial encounter: Secondary | ICD-10-CM | POA: Diagnosis present

## 2023-11-21 DIAGNOSIS — W108XXA Fall (on) (from) other stairs and steps, initial encounter: Secondary | ICD-10-CM

## 2023-11-21 MED ORDER — HYDROCODONE-ACETAMINOPHEN 5-325 MG PO TABS
1.0000 | ORAL_TABLET | Freq: Once | ORAL | Status: AC
  Administered 2023-11-21: 1 via ORAL
  Filled 2023-11-21: qty 1

## 2023-11-21 NOTE — Discharge Instructions (Addendum)
 You were seen in the emergency department today after a fall.  As we discussed the imaging of your head, wrist, and knee all looked unremarkable.  We did not see any broken bones or internal bleeding.  We have these affected areas for you, I recommend following up with your orthopedist regarding them.  You can take over-the-counter medications as needed for pain, and we gave you a one-time dose of Norco here.  Continue to monitor how you're doing and return to the ER for new or worsening symptoms.

## 2023-11-21 NOTE — ED Provider Notes (Signed)
 [place] [facility]GENC[facility] DEPARTMENT AT [facility] Provider Note   CSN: [id number] Arrival date & time: 11/21/23  1357     History  Chief Complaint  Patient presents with   Diane [name] is a 43 y.o. female with history of ADD, depression, IBS, GERD, gastric ulcers, osteoarthritis, pseudotumor cerebri, peripheral vascular disease, OSA, migraines, hyperlipidemia, who presents the emergency department after a fall.  Patient states that she was at the movie theater last night and had a mechanical fall, striking her head, and resulting in loss of consciousness.  She is mainly complaining of pain in her left wrist and left knee.  States that she is in a knee immobilizer for problems with her right knee already and is post follow-up with her orthopedist this upcoming week to discuss possible surgery.   Fall Associated symptoms include headaches.       Home Medications Prior to Admission medications   Medication Sig Start Date End Date Taking? Authorizing Provider  Fezolinetant  45 MG TABS Take 1 tablet (45 mg total) by mouth daily. Can be taken with or without food. 07/24/23   Jesus Bernardino MATSU, MD  ADDERALL XR 20 MG 24 hr capsule Take 20 mg by mouth every morning. 07/09/18   [provider]  albuterol  (PROVENTIL  HFA;VENTOLIN  HFA) 108 (90 Base) MCG/ACT inhaler Inhale 2 puffs into the lungs every 6 (six) hours as needed for wheezing or shortness of breath.     [provider]  ALPRAZolam  (XANAX ) 0.25 MG tablet SMARTSIG:0.5 Tablet(s) By Mouth PRN 07/17/21   [provider]  amoxicillin -clavulanate (AUGMENTIN ) 875-125 MG tablet Take 1 tablet by mouth 2 (two) times daily. 09/09/23   Jesus Bernardino MATSU, MD  atorvastatin  (LIPITOR) 40 MG tablet Take 1 tablet (40 mg total) by mouth daily. 07/27/23 07/21/24  Court Dorn PARAS, MD  Bacillus Coagulans-Inulin (PROBIOTIC) 1-250 BILLION-MG CAPS     [provider]  cetirizine (ZYRTEC) 10 MG tablet Take by mouth.  06/16/17   [provider]  EMGALITY  120 MG/ML SOAJ Inject 120 mg into the skin every 30 (thirty) days. 08/02/18   [provider]  fluticasone  (FLONASE ) 50 MCG/ACT nasal spray Place into the nose.    [provider]  fluticasone  (FLONASE ) 50 MCG/ACT nasal spray Place 2 sprays into both nostrils daily. 09/09/23   Jesus Bernardino MATSU, MD  gabapentin  (NEURONTIN ) 300 MG capsule TAKE 2 CAPSULES BY MOUTH 3 TIMES DAILY Patient taking differently: Take 600-1,200 mg by mouth See admin instructions. Take 600mg  by mouth in the morning and 1,200mg  in the evening 05/26/17   Johnny Garnette LABOR, MD  LINZESS 290 MCG CAPS capsule Take 290 mcg by mouth daily. 07/19/21   [provider]  loratadine  (CLARITIN ) 10 MG tablet Take 1 tablet (10 mg total) by mouth daily. 09/09/23   Jesus Bernardino MATSU, MD  magnesium  oxide (MAG-OX) 400 MG tablet Take by mouth. 06/16/17   [provider]  mirtazapine  (REMERON ) 30 MG tablet Take 30 mg by mouth daily. 09/19/22   [provider]  Multiple Vitamins-Minerals (EQL CENTURY WOMENS) TABS     [provider]  omeprazole (PRILOSEC) 40 MG capsule Take 40 mg by mouth daily. 06/06/21   [provider]  Polyethylene Glycol 3350  (MIRALAX  PO) Take by mouth at bedtime.    [provider]  promethazine  (PHENERGAN ) 25 MG tablet Take 1 tablet (25 mg total) by mouth every 8 (eight) hours as needed for nausea or vomiting. 08/14/17  Gladis Elsie BROCKS, PA-C  pseudoephedrine  (SUDAFED 12 HOUR) 120 MG 12 hr tablet Take 1 tablet (120 mg total) by mouth 2 (two) times daily. 09/09/23   Jesus Bernardino MATSU, MD  Rimegepant Sulfate (NURTEC) 75 MG TBDP Nurtec ODT 75 mg disintegrating tablet  TAKE ONE TABLET BY MOUTH AS NEEDED. one PER 24 hours 05/17/20   [provider]  Saline (SIMPLY SALINE) 0.9 % AERS Place 2 each into the nose as directed. Use nightly for sinus hygiene long-term.  Can also be used as many times daily as desired to assist  with clearing congested sinuses. 09/09/23   Jesus Bernardino MATSU, MD  senna-docusate (SENOKOT-S) 8.6-50 MG tablet Take 1 tablet by mouth daily. 06/20/23   Prosperi, Christian H, PA-C  tiZANidine  (ZANAFLEX ) 4 MG tablet Take 4 mg by mouth every 6 (six) hours as needed for muscle spasms.  11/18/16   [provider]  traZODone (DESYREL) 50 MG tablet at bedtime as needed. Patient reported that she takes half of a tablet. 07/01/23   [provider]  UBRELVY 100 MG TABS Take by mouth. 07/19/21   [provider]  venlafaxine  XR (EFFEXOR -XR) 150 MG 24 hr capsule Take 2 capsules (300 mg total) by mouth daily before breakfast. 11/06/16   Johnny Garnette LABOR, MD  Vitamin A 2400 MCG (8000 UT) TABS     [provider]  Zinc 50 MG TABS     [provider]  zonisamide (ZONEGRAN) 100 MG capsule Take 100 mg by mouth at bedtime. 07/18/22   [provider]      Allergies    Nickel, Prochlorperazine, Nsaids, Tolmetin, Oxybutynin , Adhesive [tape], Metoclopramide  hcl, Prochlorperazine edisylate, and Sulfa antibiotics    Review of Systems   Review of Systems  Musculoskeletal:  Positive for arthralgias.  Neurological:  Positive for syncope and headaches.  All other systems reviewed and are negative.   Physical Exam Updated Vital Signs BP 139/86   Pulse 79   Temp 98.5 F (36.9 C) (Oral)   Resp 16   Ht 5' 7 (1.702 m)   Wt 105.7 kg   LMP 08/26/2018   SpO2 98%   BMI 36.50 kg/m  Physical Exam Vitals and nursing note reviewed.  Constitutional:      Appearance: Normal appearance.  HENT:     Head: Normocephalic and atraumatic.  Eyes:     Conjunctiva/sclera: Conjunctivae normal.  Cardiovascular:     Pulses:          Radial pulses are 2+ on the left side.       Posterior tibial pulses are 2+ on the left side.  Pulmonary:     Effort: Pulmonary effort is normal. No respiratory distress.  Musculoskeletal:     Comments: Swelling and tenderness over L ventral wrist over  styloid process, normal ROM of digits, normal sensation, normal radial pulse  Swelling just medial to the L patella, no bony deformities, pain with ranging the knee  Skin:    General: Skin is warm and dry.  Neurological:     Mental Status: She is alert.  Psychiatric:        Mood and Affect: Mood normal.        Behavior: Behavior normal.     ED Results / Procedures / Treatments   Labs (all labs ordered are listed, but only abnormal results are displayed) Labs Reviewed - No data to display  EKG None  Radiology DG Wrist Complete Left Result Date: 11/21/2023 CLINICAL DATA:  Status  post fall.  Left wrist pain. EXAM: LEFT WRIST - COMPLETE 3+ VIEW COMPARISON:  None Available. FINDINGS: There is no evidence of fracture or dislocation. There is no evidence of arthropathy or other focal bone abnormality. Soft tissues are unremarkable. IMPRESSION: Negative. Electronically Signed   By: Waddell Calk M.D.   On: 11/21/2023 15:19   DG Knee Complete 4 Views Left Result Date: 11/21/2023 CLINICAL DATA:  Status post fall. EXAM: LEFT KNEE - COMPLETE 4+ VIEW COMPARISON:  None Available. FINDINGS: No joint effusion. No signs of acute fracture or dislocation. No significant arthropathy. Soft tissues appear within normal limits. IMPRESSION: Negative. Electronically Signed   By: Waddell Calk M.D.   On: 11/21/2023 15:17   CT Head Wo Contrast Result Date: 11/21/2023 CLINICAL DATA:  Head trauma, GCS=15, no focal neuro findings (low risk). Fall with loss of consciousness. EXAM: CT HEAD WITHOUT CONTRAST TECHNIQUE: Contiguous axial images were obtained from the base of the skull through the vertex without intravenous contrast. RADIATION DOSE REDUCTION: This exam was performed according to the departmental dose-optimization program which includes automated exposure control, adjustment of the mA and/or kV according to patient size and/or use of iterative reconstruction technique. COMPARISON:  Head MRI 04/13/2008  FINDINGS: Brain: There is no evidence of an acute infarct, intracranial hemorrhage, mass, midline shift, or extra-axial fluid collection. The ventricles and sulci are normal. Vascular: No hyperdense vessel. Skull: No acute fracture or suspicious osseous lesion. Sinuses/Orbits: Visualized paranasal sinuses and mastoid air cells are clear. Unremarkable orbits. Other: None. IMPRESSION: Negative head CT. Electronically Signed   By: Dasie Hamburg M.D.   On: 11/21/2023 14:47    Procedures Procedures    Medications Ordered in ED Medications  HYDROcodone -acetaminophen  (NORCO/VICODIN) 5-325 MG per tablet 1 tablet (1 tablet Oral Given 11/21/23 1727)    ED Course/ Medical Decision Making/ A&P                                 Medical Decision Making Amount and/or Complexity of Data Reviewed Radiology: ordered.   This patient is a 43 y.o. female  who presents to the ED for concern of mechanical fall, head injury, L wrist and L knee pain.   Differential diagnoses prior to evaluation: The emergent differential diagnosis includes, but is not limited to,  fracture, dislocation, ligamentous injury, intracranial bleeding. This is not an exhaustive differential.   Past Medical History / Co-morbidities / Social History: ADD, depression, IBS, GERD, gastric ulcers, osteoarthritis, pseudotumor cerebri, peripheral vascular disease, OSA, migraines, hyperlipidemia  Physical Exam: Physical exam performed. The pertinent findings include: Some swelling over the dorsal L wrist over ulnar styloid, normal sensation of the fingers. Some swelling just medial to the patella. Head atraumatic.   Lab Tests/Imaging studies: I personally interpreted labs/imaging and the pertinent results include:  XR's of left wrist and left knee with no acute findings. CT head without acute intracranial abnormalities. I agree with the radiologist interpretation.  Medications: I ordered medication including norco.  I have reviewed the  patients home medicines and have made adjustments as needed.   Disposition: After consideration of the diagnostic results and the patients response to treatment, I feel that emergency department workup does not suggest an emergent condition requiring admission or immediate intervention beyond what has been performed at this time. The plan is: Discharge to home.  Knee placed in Ace wrap, and wrist placed in wrist brace.  Patient already has follow-up scheduled  with orthopedics.  Recommend OTC meds as needed for pain, ice as needed for swelling.. The patient is safe for discharge and has been instructed to return immediately for worsening symptoms, change in symptoms or any other concerns.  Final Clinical Impression(s) / ED Diagnoses Final diagnoses:  Fall (on) (from) other stairs and steps, initial encounter  Left wrist pain  Acute pain of left knee  Traumatic injury of head with loss of consciousness (HCC)    Rx / DC Orders ED Discharge Orders     None      Portions of this report may have been transcribed using voice recognition software. Every effort was made to ensure accuracy; however, inadvertent computerized transcription errors may be present.    Rayn Shorb T, PA-C 11/21/23 1829    Tegeler, Lonni PARAS, MD 11/21/23 2200

## 2023-11-21 NOTE — ED Triage Notes (Signed)
 Pt states in knee immobilizer for Right knee  States had mechanical fall at movie theater, fell  Hit head, had LOC, states left wrist pain  And left knee pain

## 2023-12-04 ENCOUNTER — Other Ambulatory Visit: Payer: Self-pay | Admitting: Orthopedic Surgery

## 2023-12-04 DIAGNOSIS — M25561 Pain in right knee: Secondary | ICD-10-CM

## 2023-12-10 ENCOUNTER — Ambulatory Visit
Admission: RE | Admit: 2023-12-10 | Discharge: 2023-12-10 | Disposition: A | Discharge status: Home / Self Care | Payer: BC Managed Care – PPO | Source: Ambulatory Visit | Attending: Orthopedic Surgery | Admitting: Orthopedic Surgery

## 2023-12-10 DIAGNOSIS — M25561 Pain in right knee: Secondary | ICD-10-CM

## 2023-12-18 ENCOUNTER — Ambulatory Visit (HOSPITAL_BASED_OUTPATIENT_CLINIC_OR_DEPARTMENT_OTHER): Payer: Self-pay | Admitting: Orthopaedic Surgery

## 2023-12-18 ENCOUNTER — Ambulatory Visit (HOSPITAL_BASED_OUTPATIENT_CLINIC_OR_DEPARTMENT_OTHER): Payer: BC Managed Care – PPO | Admitting: Orthopaedic Surgery

## 2023-12-18 ENCOUNTER — Other Ambulatory Visit (HOSPITAL_BASED_OUTPATIENT_CLINIC_OR_DEPARTMENT_OTHER): Payer: Self-pay

## 2023-12-18 DIAGNOSIS — M8700 Idiopathic aseptic necrosis of unspecified bone: Secondary | ICD-10-CM

## 2023-12-18 MED ORDER — OXYCODONE HCL 5 MG PO TABS: 5.0000 mg | ORAL_TABLET | ORAL | 0 refills | Status: DC | PRN

## 2023-12-18 MED ORDER — OXYCODONE HCL 5 MG PO TABS
5.0000 mg | ORAL_TABLET | ORAL | 0 refills | Status: DC | PRN
  Filled 2023-12-18: qty 10, 2d supply, fill #0

## 2023-12-18 MED ORDER — ASPIRIN 81 MG PO TBEC: 81.0000 mg | DELAYED_RELEASE_TABLET | Freq: Every day | ORAL | 0 refills | Status: DC

## 2023-12-18 MED ORDER — ACETAMINOPHEN 500 MG PO TABS
500.0000 mg | ORAL_TABLET | Freq: Three times a day (TID) | ORAL | 0 refills | Status: AC
Start: 2023-12-18 — End: 2023-12-29
  Filled 2023-12-18: qty 30, 10d supply, fill #0

## 2023-12-18 MED ORDER — ASPIRIN 81 MG PO TBEC
81.0000 mg | DELAYED_RELEASE_TABLET | Freq: Every day | ORAL | 12 refills | Status: DC
Start: 2023-12-18 — End: 2024-02-02
  Filled 2023-12-18: qty 30, 30d supply, fill #0

## 2023-12-18 MED ORDER — ACETAMINOPHEN 500 MG PO TABS: 500.0000 mg | ORAL_TABLET | Freq: Three times a day (TID) | ORAL | 0 refills | Status: DC

## 2023-12-18 NOTE — Progress Notes (Signed)
Chief Complaint: Right knee pain     History of Present Illness:    Diane [name] is a 43 y.o. female presents today with ongoing right knee pain and has been persistent over the last several years.  She is here today as a referral and recommendation from her mother who has been seeing me.  She has had an MRI with Dr. Aundria Rud at [facility] and is here today for further discussion.  She states that the knee is continuing to buckle and give out.  This buckled very significantly on Christmas of this last year.  She was placed in a knee immobilizer at [facility].  She is here today for further discussion as she is having a difficult time with really any weightbearing and significant pain and swelling.  She is a stay-at-home mother.  She does enjoy being active although this is limited by her knee pain she has previously had an injection in this knee without any relief.  She has previously undergone physical therapy for strengthening of the knee again without any relief or change    PMH/PSH/Family History/Social History/Meds/Allergies:    Past Medical History:  Diagnosis Date   ADD (attention deficit disorder with hyperactivity)    Allergy    Anxiety    Arthritis    Atypical chest pain 07/26/2021   Atypical chest pain     Benign intracranial hypertension 06/16/2008   Qualifier: Diagnosis of   By: Zimonjic, Milica         Choking    due to food and pills get stuck   Cholelithiasis and acute cholecystitis without obstruction 12/02/2016   Chronic cough 12/27/2013   Depression    Dry mouth 08/12/2017   Dysmenorrhea    Family history of adverse reaction to anesthesia    pt's father has hx. of being hard to wake up post-op   Gastric ulcer 03/16/2015   GERD (gastroesophageal reflux disease)    Headache 07/09/2007   Qualifier: Diagnosis of   By: Cato Mulligan MD, Bruce      Replacing diagnoses that were inactivated after the 02/16/23 regulatory import     History of gastric ulcer     History of kidney stones    Hyperlipidemia    IBS (irritable bowel syndrome)    Lower extremity edema 07/26/2021   Lower extremity edema     Migraines    neurologist-  dr c. Sharene Skeans ([facility] in [place])-- treated with Emgality injection every 30 days   Mild obstructive sleep apnea    per study in epic 03-21-2017 mild osa, recommendation cpap, mouth appliance, wt loss   Nausea and vomiting 12/03/2016   OAB (overactive bladder) 08/12/2017   Osteoarthritis    right ankle   Osteochondral lesion 09/2016   right ankle   Osteomyelitis of knee region [facility])    Peripheral vascular disease (HCC)    NERVE DAMAGE RIGHT LEG AND FOOT DUE TO INJURGY.    Plantar fasciitis of left foot    Pleural effusion 12/15/2013   PONV (postoperative nausea and vomiting)    AND HEADACHES   Pseudotumor cerebri    at back head   RUQ pain    S/P dilatation of esophageal stricture 07/2018   Sinusitis 08/12/2018   RESOLVED WITH ANTIOBIOTIC   SUI (stress urinary incontinence, female)    Wears contact lenses    Wears partial dentures    upper   Past Surgical History:  Procedure Laterality Date   ANKLE ARTHROSCOPY Right 10/22/2016  Procedure: ANKLE ARTHROSCOPY;  Surgeon: Vivi Barrack, DPM;  Location: [facility];  Service: Podiatry;  Laterality: Right;  GENERAL/REG BLOCK   ANKLE ARTHROSCOPY Right 09-15-2017   @[facility]    BLADDER SURGERY  child   x 2 - as a child to stretch bladder   CESAREAN SECTION  12/24/2009   CESAREAN SECTION  11/20/2011   Procedure: CESAREAN SECTION;  Surgeon: Lenoard Aden, MD;  Location: [facility];  Service: Gynecology;  Laterality: N/A;   CHOLECYSTECTOMY N/A 12/03/2016   Procedure: LAPAROSCOPIC CHOLECYSTECTOMY WITH INTRAOPERATIVE CHOLANGIOGRAM;  Surgeon: Darnell Level, MD;  Location: [facility];  Service: General;  Laterality: N/A;   CYSTOSCOPY WITH RETROGRADE PYELOGRAM, URETEROSCOPY AND STENT PLACEMENT Left 08/10/2006   ESOPHAGOGASTRODUODENOSCOPY (EGD) WITH  ESOPHAGEAL DILATION  07/2018   HARDWARE REMOVAL Right 07/01/2016   Procedure: HARDWARE REMOVAL;  Surgeon: Cammy Copa, MD;  Location: [facility];  Service: Orthopedics;  Laterality: Right;   KNEE ARTHROSCOPY WITH LATERAL RELEASE Right 12/13/2014   Procedure: KNEE ARTHROSCOPY WITH LATERAL RELEASE;  Surgeon: Thera Flake., MD;  Location: [facility];  Service: Orthopedics;  Laterality: Right;   LAPAROSCOPIC APPENDECTOMY N/A 06/11/2014   Procedure: APPENDECTOMY LAPAROSCOPIC;  Surgeon: Velora Heckler, MD;  Location: [facility];  Service: General;  Laterality: N/A;   ORIF ANKLE FRACTURE Right 01/05/2016   Procedure: OPEN REDUCTION INTERNAL FIXATION (ORIF) ANKLE FRACTURE;  Surgeon: Cammy Copa, MD;  Location: [facility];  Service: Orthopedics;  Laterality: Right;   OVARIAN CYST SURGERY Right 03/05/2007   AND CHROMOPERTUBATION  VIA LAPAROSCOPY   ROBOTIC ASSISTED LAPAROSCOPIC HYSTERECTOMY AND SALPINGECTOMY Bilateral 08/27/2018   Procedure: XI ROBOTIC ASSISTED LAPAROSCOPIC HYSTERECTOMY AND BILATERAL SALPINGECTOMY;  Surgeon: Olivia Mackie, MD;  Location: [facility];  Service: Gynecology;  Laterality: Bilateral;  [facility] for 23hr OBS   SYNOVECTOMY Right 12/13/2014   Procedure: PLICA SYNOVECTOMY;  Surgeon: Thera Flake., MD;  Location: [facility];  Service: Orthopedics;  Laterality: Right;   TONSILLECTOMY AND ADENOIDECTOMY  child   WISDOM TOOTH EXTRACTION     Social History   Socioeconomic History   Marital status: Married    Spouse name: Not on file   Number of children: 2   Years of education: Not on file   Highest education level: Not on file  Occupational History   Occupation: Investment banker, 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  Tobacco Use   Smoking status: Former    Current packs/day: 0.50    Average packs/day: 0.5 packs/day for 15.0 years (7.5 ttl pk-yrs)    Types: Cigarettes   Smokeless tobacco: Never  Vaping Use   Vaping status: Every Day  Substance and  Sexual Activity   Alcohol use: Yes    Comment: occasional   Drug use: No   Sexual activity: Yes    Birth control/protection: None, Other-see comments    Comment: partial hysterectomy  Other Topics Concern   Not on file  Social History Narrative   Not on file   Social Drivers of Health   Financial Resource Strain: Not on file  Food Insecurity: Not on file  Transportation Needs: Not on file  Physical Activity: Not on file  Stress: Not on file  Social Connections: Unknown (03/26/2022)   [facility] from [facility], [facility]   Social Network    Social Network: Not on file   Family History  Problem Relation Age of Onset   Learning disabilities Mother  Asthma Mother    Arthritis Mother    Irritable bowel syndrome Mother    Miscarriages / [place] Mother    Hypertension Father    Early death Father    Drug abuse Father    Depression Father    Cancer Father    Arthritis Father    Alcohol abuse Father    Brain cancer Father    Anesthesia problems Father        hard to wake up post-op   Lung cancer Father    Hyperlipidemia Maternal Grandmother    Hypertension Maternal Grandmother    Cancer Maternal Grandmother    Asthma Maternal Grandmother    Arthritis Maternal Grandmother    Breast cancer Maternal Grandmother    Ovarian cancer Maternal Grandmother    Diabetes Maternal Grandmother    Heart disease Maternal Grandmother    Heart attack Maternal Grandmother    Hypertension Maternal Grandfather    Hyperlipidemia Maternal Grandfather    Arthritis Maternal Grandfather    Alcohol abuse Maternal Grandfather    Cancer Maternal Grandfather    Prostate cancer Maternal Grandfather    Melanoma Maternal Grandfather    Cancer - Prostate Maternal Grandfather    Hypertension Paternal Grandmother    COPD Paternal Grandmother    Arthritis Paternal Grandmother    Learning disabilities Daughter    Learning disabilities Son    Colon cancer Neg Hx    Allergies  Allergen  Reactions   Nickel Rash    "any jewelry that's not real"/ rash and itching    Prochlorperazine Other (See Comments)   Nsaids Other (See Comments)    DUE TO HISTORY OF GASTRIC ULCER   Tolmetin Itching and Other (See Comments)    DUE TO HISTORY OF GASTRIC ULCER  DUE TO HISTORY OF GASTRIC ULCER  DUE TO HISTORY OF GASTRIC ULCER, DUE TO HISTORY OF GASTRIC ULCER   Oxybutynin Other (See Comments)    "severe dry mouth"   Adhesive [Tape] Rash   Metoclopramide Hcl Other (See Comments)    RESTLESSNESS/JITTERY   Prochlorperazine Edisylate Other (See Comments)    RESTLESSNESS/JITTERY    Sulfa Antibiotics Rash   Current Outpatient Medications  Medication Sig Dispense Refill   aspirin EC 81 MG tablet Take 1 tablet (81 mg total) by mouth daily. Swallow whole. 14 tablet 0   aspirin EC 81 MG tablet Take 1 tablet (81 mg total) by mouth daily. Swallow whole. 30 tablet 12   Fezolinetant 45 MG TABS Take 1 tablet (45 mg total) by mouth daily. Can be taken with or without food. 90 tablet 3   acetaminophen (TYLENOL) 500 MG tablet Take 1 tablet (500 mg total) by mouth every 8 (eight) hours for 10 days. 30 tablet 0   ADDERALL XR 20 MG 24 hr capsule Take 20 mg by mouth every morning.  0   albuterol (PROVENTIL HFA;VENTOLIN HFA) 108 (90 Base) MCG/ACT inhaler Inhale 2 puffs into the lungs every 6 (six) hours as needed for wheezing or shortness of breath.      ALPRAZolam (XANAX) 0.25 MG tablet SMARTSIG:0.5 Tablet(s) By Mouth PRN     amoxicillin-clavulanate (AUGMENTIN) 875-125 MG tablet Take 1 tablet by mouth 2 (two) times daily. 14 tablet 0   atorvastatin (LIPITOR) 40 MG tablet Take 1 tablet (40 mg total) by mouth daily. 60 tablet 0   Bacillus Coagulans-Inulin (PROBIOTIC) 1-250 BILLION-MG CAPS      cetirizine (ZYRTEC) 10 MG tablet Take by mouth.     EMGALITY 120 MG/ML  SOAJ Inject 120 mg into the skin every 30 (thirty) days.  4   fluticasone (FLONASE) 50 MCG/ACT nasal spray Place into the nose.     fluticasone  (FLONASE) 50 MCG/ACT nasal spray Place 2 sprays into both nostrils daily. 16 g 6   gabapentin (NEURONTIN) 300 MG capsule TAKE 2 CAPSULES BY MOUTH 3 TIMES DAILY (Patient taking differently: Take 600-1,200 mg by mouth See admin instructions. Take 600mg  by mouth in the morning and 1,200mg  in the evening) 180 capsule 4   LINZESS 290 MCG CAPS capsule Take 290 mcg by mouth daily.     loratadine (CLARITIN) 10 MG tablet Take 1 tablet (10 mg total) by mouth daily. 30 tablet 11   magnesium oxide (MAG-OX) 400 MG tablet Take by mouth.     mirtazapine (REMERON) 30 MG tablet Take 30 mg by mouth daily.     Multiple Vitamins-Minerals (EQL CENTURY WOMENS) TABS      omeprazole (PRILOSEC) 40 MG capsule Take 40 mg by mouth daily.     oxyCODONE (ROXICODONE) 5 MG immediate release tablet Take 1 tablet (5 mg total) by mouth every 4 (four) hours as needed for severe pain (pain score 7-10) or breakthrough pain. 10 tablet 0   Polyethylene Glycol 3350 (MIRALAX PO) Take by mouth at bedtime.     promethazine (PHENERGAN) 25 MG tablet Take 1 tablet (25 mg total) by mouth every 8 (eight) hours as needed for nausea or vomiting. 10 tablet 0   pseudoephedrine (SUDAFED 12 HOUR) 120 MG 12 hr tablet Take 1 tablet (120 mg total) by mouth 2 (two) times daily. 20 tablet 0   Rimegepant Sulfate (NURTEC) 75 MG TBDP Nurtec ODT 75 mg disintegrating tablet  TAKE ONE TABLET BY MOUTH AS NEEDED. one PER 24 hours     Saline (SIMPLY SALINE) 0.9 % AERS Place 2 each into the nose as directed. Use nightly for sinus hygiene long-term.  Can also be used as many times daily as desired to assist with clearing congested sinuses. 127 mL 11   senna-docusate (SENOKOT-S) 8.6-50 MG tablet Take 1 tablet by mouth daily. 10 tablet 0   tiZANidine (ZANAFLEX) 4 MG tablet Take 4 mg by mouth every 6 (six) hours as needed for muscle spasms.      traZODone (DESYREL) 50 MG tablet at bedtime as needed. Patient reported that she takes half of a tablet.     UBRELVY 100 MG  TABS Take by mouth.     venlafaxine XR (EFFEXOR-XR) 150 MG 24 hr capsule Take 2 capsules (300 mg total) by mouth daily before breakfast. 60 capsule 11   Vitamin A 2400 MCG (8000 UT) TABS      Zinc 50 MG TABS      zonisamide (ZONEGRAN) 100 MG capsule Take 100 mg by mouth at bedtime.     No current facility-administered medications for this visit.   No results found.  Review of Systems:   A ROS was performed including pertinent positives and negatives as documented in the HPI.  Physical Exam :   Constitutional: NAD and appears stated age Neurological: Alert and oriented Psych: Appropriate affect and cooperative Last menstrual period 08/26/2018.   Comprehensive Musculoskeletal Exam:      Musculoskeletal Exam  Gait Normal  Alignment Normal   Right Left  Inspection Normal Normal  Palpation    Tenderness Lateral femoral condyle None  Crepitus None None  Effusion Mild None  Range of Motion    Extension 0 -3  Flexion 135 135  Strength    Extension 5/5 5/5  Flexion 5/5 5/5  Ligament Exam     Generalized Laxity No No  Lachman Negative Negative   Pivot Shift Negative Negative  Anterior Drawer Negative Negative  Valgus at 0 Negative Negative  Valgus at 20 Negative Negative  Varus at 0 0 0  Varus at 20   0 0  Posterior Drawer at 90 0 0  Vascular/Lymphatic Exam    Edema None None  Venous Stasis Changes No No  Distal Circulation Normal Normal  Neurologic    Light Touch Sensation Intact Intact  Special Tests:       Imaging:   Xray (3 views right knee): Normal  MRI (right knee): There is a focus of cystic appearing changes in the posterior lateral femoral condyle consistent with a focus of AVN.  There is some surrounding edema.  There is a small chondral lesion involving the posterior femoral condyle although this is overall mild   I personally reviewed and interpreted the radiographs.   Assessment and Plan:   43 y.o. female with complex right knee pain.  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  She has had a similar condition in the ankle for which she underwent bone marrow BMAC placement for which she did extremely well.  I did discuss that overall I do not see any large chondral changes that I believe would be the underlying etiology of this although I do believe she would benefit from diagnostic arthroscopy and possible abrasion arthroplasty to address any type of chondral issues.  I do not see any obvious meniscal injury.  She has trialed an injection which gave her no relief.  She has also trialed physical therapy again without any relief.  Given her persistent knee pain and inability to stay active overall I do believe she would benefit from intervention.  I discussed the risks and limitations as well as associated rehab.  -Plan for right knee lateral femoral condyle chondroplasty, bone marrow aspirate concentrate aspiration right iliac crest, decompression and bone marrow placement right lateral femur   After a lengthy discussion of treatment options, including risks, benefits, alternatives, complications of surgical and nonsurgical conservative options, the patient elected surgical repair.   The patient  is aware of the material risks  and complications including, but not limited to injury to adjacent structures, neurovascular injury, infection, numbness, bleeding, implant failure, thermal burns, stiffness, persistent pain, failure to heal, disease transmission from allograft, need for further surgery, dislocation, anesthetic risks, blood clots, risks of death,and others. The probabilities of surgical success and failure discussed with patient given their particular co-morbidities.The time and nature of expected rehabilitation and recovery was discussed.The patient's questions were all answered preoperatively.  No barriers to understanding were noted. I explained the  natural history of the disease process and Rx rationale.  I explained to the patient what I considered to be reasonable expectations given their personal situation.  The final treatment plan was arrived at through a shared patient decision making process model.    I personally saw and evaluated the patient, and participated in the management and treatment plan.  [name], MD Attending Physician, Orthopedic Surgery  This document was dictated using Dragon voice recognition software. A reasonable attempt at proof reading has been made to minimize errors.

## 2023-12-18 NOTE — H&P (View-Only) (Signed)
 Chief Complaint: Right knee pain     History of Present Illness:    Diane [name] is a 43 y.o. female presents today with ongoing right knee pain and has been persistent over the last several years.  She is here today as a referral and recommendation from her mother who has been seeing me.  She has had an MRI with Dr. Aundria Rud at [facility] and is here today for further discussion.  She states that the knee is continuing to buckle and give out.  This buckled very significantly on Christmas of this last year.  She was placed in a knee immobilizer at [facility].  She is here today for further discussion as she is having a difficult time with really any weightbearing and significant pain and swelling.  She is a stay-at-home mother.  She does enjoy being active although this is limited by her knee pain she has previously had an injection in this knee without any relief.  She has previously undergone physical therapy for strengthening of the knee again without any relief or change    PMH/PSH/Family History/Social History/Meds/Allergies:    Past Medical History:  Diagnosis Date   ADD (attention deficit disorder with hyperactivity)    Allergy    Anxiety    Arthritis    Atypical chest pain 07/26/2021   Atypical chest pain     Benign intracranial hypertension 06/16/2008   Qualifier: Diagnosis of   By: Zimonjic, Milica         Choking    due to food and pills get stuck   Cholelithiasis and acute cholecystitis without obstruction 12/02/2016   Chronic cough 12/27/2013   Depression    Dry mouth 08/12/2017   Dysmenorrhea    Family history of adverse reaction to anesthesia    pt's father has hx. of being hard to wake up post-op   Gastric ulcer 03/16/2015   GERD (gastroesophageal reflux disease)    Headache 07/09/2007   Qualifier: Diagnosis of   By: Cato Mulligan MD, Bruce      Replacing diagnoses that were inactivated after the 02/16/23 regulatory import     History of gastric ulcer     History of kidney stones    Hyperlipidemia    IBS (irritable bowel syndrome)    Lower extremity edema 07/26/2021   Lower extremity edema     Migraines    neurologist-  dr c. Sharene Skeans ([facility] in [place])-- treated with Emgality injection every 30 days   Mild obstructive sleep apnea    per study in epic 03-21-2017 mild osa, recommendation cpap, mouth appliance, wt loss   Nausea and vomiting 12/03/2016   OAB (overactive bladder) 08/12/2017   Osteoarthritis    right ankle   Osteochondral lesion 09/2016   right ankle   Osteomyelitis of knee region [facility])    Peripheral vascular disease (HCC)    NERVE DAMAGE RIGHT LEG AND FOOT DUE TO INJURGY.    Plantar fasciitis of left foot    Pleural effusion 12/15/2013   PONV (postoperative nausea and vomiting)    AND HEADACHES   Pseudotumor cerebri    at back head   RUQ pain    S/P dilatation of esophageal stricture 07/2018   Sinusitis 08/12/2018   RESOLVED WITH ANTIOBIOTIC   SUI (stress urinary incontinence, female)    Wears contact lenses    Wears partial dentures    upper   Past Surgical History:  Procedure Laterality Date   ANKLE ARTHROSCOPY Right 10/22/2016  Procedure: ANKLE ARTHROSCOPY;  Surgeon: Vivi Barrack, DPM;  Location: [facility];  Service: Podiatry;  Laterality: Right;  GENERAL/REG BLOCK   ANKLE ARTHROSCOPY Right 09-15-2017   @[facility]    BLADDER SURGERY  child   x 2 - as a child to stretch bladder   CESAREAN SECTION  12/24/2009   CESAREAN SECTION  11/20/2011   Procedure: CESAREAN SECTION;  Surgeon: Lenoard Aden, MD;  Location: [facility];  Service: Gynecology;  Laterality: N/A;   CHOLECYSTECTOMY N/A 12/03/2016   Procedure: LAPAROSCOPIC CHOLECYSTECTOMY WITH INTRAOPERATIVE CHOLANGIOGRAM;  Surgeon: Darnell Level, MD;  Location: [facility];  Service: General;  Laterality: N/A;   CYSTOSCOPY WITH RETROGRADE PYELOGRAM, URETEROSCOPY AND STENT PLACEMENT Left 08/10/2006   ESOPHAGOGASTRODUODENOSCOPY (EGD) WITH  ESOPHAGEAL DILATION  07/2018   HARDWARE REMOVAL Right 07/01/2016   Procedure: HARDWARE REMOVAL;  Surgeon: Cammy Copa, MD;  Location: [facility];  Service: Orthopedics;  Laterality: Right;   KNEE ARTHROSCOPY WITH LATERAL RELEASE Right 12/13/2014   Procedure: KNEE ARTHROSCOPY WITH LATERAL RELEASE;  Surgeon: Thera Flake., MD;  Location: [facility];  Service: Orthopedics;  Laterality: Right;   LAPAROSCOPIC APPENDECTOMY N/A 06/11/2014   Procedure: APPENDECTOMY LAPAROSCOPIC;  Surgeon: Velora Heckler, MD;  Location: [facility];  Service: General;  Laterality: N/A;   ORIF ANKLE FRACTURE Right 01/05/2016   Procedure: OPEN REDUCTION INTERNAL FIXATION (ORIF) ANKLE FRACTURE;  Surgeon: Cammy Copa, MD;  Location: [facility];  Service: Orthopedics;  Laterality: Right;   OVARIAN CYST SURGERY Right 03/05/2007   AND CHROMOPERTUBATION  VIA LAPAROSCOPY   ROBOTIC ASSISTED LAPAROSCOPIC HYSTERECTOMY AND SALPINGECTOMY Bilateral 08/27/2018   Procedure: XI ROBOTIC ASSISTED LAPAROSCOPIC HYSTERECTOMY AND BILATERAL SALPINGECTOMY;  Surgeon: Olivia Mackie, MD;  Location: [facility];  Service: Gynecology;  Laterality: Bilateral;  [facility] for 23hr OBS   SYNOVECTOMY Right 12/13/2014   Procedure: PLICA SYNOVECTOMY;  Surgeon: Thera Flake., MD;  Location: [facility];  Service: Orthopedics;  Laterality: Right;   TONSILLECTOMY AND ADENOIDECTOMY  child   WISDOM TOOTH EXTRACTION     Social History   Socioeconomic History   Marital status: Married    Spouse name: Not on file   Number of children: 2   Years of education: Not on file   Highest education level: Not on file  Occupational History   Occupation: Investment banker, 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  Tobacco Use   Smoking status: Former    Current packs/day: 0.50    Average packs/day: 0.5 packs/day for 15.0 years (7.5 ttl pk-yrs)    Types: Cigarettes   Smokeless tobacco: Never  Vaping Use   Vaping status: Every Day  Substance and  Sexual Activity   Alcohol use: Yes    Comment: occasional   Drug use: No   Sexual activity: Yes    Birth control/protection: None, Other-see comments    Comment: partial hysterectomy  Other Topics Concern   Not on file  Social History Narrative   Not on file   Social Drivers of Health   Financial Resource Strain: Not on file  Food Insecurity: Not on file  Transportation Needs: Not on file  Physical Activity: Not on file  Stress: Not on file  Social Connections: Unknown (03/26/2022)   [facility] from [facility], [facility]   Social Network    Social Network: Not on file   Family History  Problem Relation Age of Onset   Learning disabilities Mother  Asthma Mother    Arthritis Mother    Irritable bowel syndrome Mother    Miscarriages / [place] Mother    Hypertension Father    Early death Father    Drug abuse Father    Depression Father    Cancer Father    Arthritis Father    Alcohol abuse Father    Brain cancer Father    Anesthesia problems Father        hard to wake up post-op   Lung cancer Father    Hyperlipidemia Maternal Grandmother    Hypertension Maternal Grandmother    Cancer Maternal Grandmother    Asthma Maternal Grandmother    Arthritis Maternal Grandmother    Breast cancer Maternal Grandmother    Ovarian cancer Maternal Grandmother    Diabetes Maternal Grandmother    Heart disease Maternal Grandmother    Heart attack Maternal Grandmother    Hypertension Maternal Grandfather    Hyperlipidemia Maternal Grandfather    Arthritis Maternal Grandfather    Alcohol abuse Maternal Grandfather    Cancer Maternal Grandfather    Prostate cancer Maternal Grandfather    Melanoma Maternal Grandfather    Cancer - Prostate Maternal Grandfather    Hypertension Paternal Grandmother    COPD Paternal Grandmother    Arthritis Paternal Grandmother    Learning disabilities Daughter    Learning disabilities Son    Colon cancer Neg Hx    Allergies  Allergen  Reactions   Nickel Rash    "any jewelry that's not real"/ rash and itching    Prochlorperazine Other (See Comments)   Nsaids Other (See Comments)    DUE TO HISTORY OF GASTRIC ULCER   Tolmetin Itching and Other (See Comments)    DUE TO HISTORY OF GASTRIC ULCER  DUE TO HISTORY OF GASTRIC ULCER  DUE TO HISTORY OF GASTRIC ULCER, DUE TO HISTORY OF GASTRIC ULCER   Oxybutynin Other (See Comments)    "severe dry mouth"   Adhesive [Tape] Rash   Metoclopramide Hcl Other (See Comments)    RESTLESSNESS/JITTERY   Prochlorperazine Edisylate Other (See Comments)    RESTLESSNESS/JITTERY    Sulfa Antibiotics Rash   Current Outpatient Medications  Medication Sig Dispense Refill   aspirin EC 81 MG tablet Take 1 tablet (81 mg total) by mouth daily. Swallow whole. 14 tablet 0   aspirin EC 81 MG tablet Take 1 tablet (81 mg total) by mouth daily. Swallow whole. 30 tablet 12   Fezolinetant 45 MG TABS Take 1 tablet (45 mg total) by mouth daily. Can be taken with or without food. 90 tablet 3   acetaminophen (TYLENOL) 500 MG tablet Take 1 tablet (500 mg total) by mouth every 8 (eight) hours for 10 days. 30 tablet 0   ADDERALL XR 20 MG 24 hr capsule Take 20 mg by mouth every morning.  0   albuterol (PROVENTIL HFA;VENTOLIN HFA) 108 (90 Base) MCG/ACT inhaler Inhale 2 puffs into the lungs every 6 (six) hours as needed for wheezing or shortness of breath.      ALPRAZolam (XANAX) 0.25 MG tablet SMARTSIG:0.5 Tablet(s) By Mouth PRN     amoxicillin-clavulanate (AUGMENTIN) 875-125 MG tablet Take 1 tablet by mouth 2 (two) times daily. 14 tablet 0   atorvastatin (LIPITOR) 40 MG tablet Take 1 tablet (40 mg total) by mouth daily. 60 tablet 0   Bacillus Coagulans-Inulin (PROBIOTIC) 1-250 BILLION-MG CAPS      cetirizine (ZYRTEC) 10 MG tablet Take by mouth.     EMGALITY 120 MG/ML  SOAJ Inject 120 mg into the skin every 30 (thirty) days.  4   fluticasone (FLONASE) 50 MCG/ACT nasal spray Place into the nose.     fluticasone  (FLONASE) 50 MCG/ACT nasal spray Place 2 sprays into both nostrils daily. 16 g 6   gabapentin (NEURONTIN) 300 MG capsule TAKE 2 CAPSULES BY MOUTH 3 TIMES DAILY (Patient taking differently: Take 600-1,200 mg by mouth See admin instructions. Take 600mg  by mouth in the morning and 1,200mg  in the evening) 180 capsule 4   LINZESS 290 MCG CAPS capsule Take 290 mcg by mouth daily.     loratadine (CLARITIN) 10 MG tablet Take 1 tablet (10 mg total) by mouth daily. 30 tablet 11   magnesium oxide (MAG-OX) 400 MG tablet Take by mouth.     mirtazapine (REMERON) 30 MG tablet Take 30 mg by mouth daily.     Multiple Vitamins-Minerals (EQL CENTURY WOMENS) TABS      omeprazole (PRILOSEC) 40 MG capsule Take 40 mg by mouth daily.     oxyCODONE (ROXICODONE) 5 MG immediate release tablet Take 1 tablet (5 mg total) by mouth every 4 (four) hours as needed for severe pain (pain score 7-10) or breakthrough pain. 10 tablet 0   Polyethylene Glycol 3350 (MIRALAX PO) Take by mouth at bedtime.     promethazine (PHENERGAN) 25 MG tablet Take 1 tablet (25 mg total) by mouth every 8 (eight) hours as needed for nausea or vomiting. 10 tablet 0   pseudoephedrine (SUDAFED 12 HOUR) 120 MG 12 hr tablet Take 1 tablet (120 mg total) by mouth 2 (two) times daily. 20 tablet 0   Rimegepant Sulfate (NURTEC) 75 MG TBDP Nurtec ODT 75 mg disintegrating tablet  TAKE ONE TABLET BY MOUTH AS NEEDED. one PER 24 hours     Saline (SIMPLY SALINE) 0.9 % AERS Place 2 each into the nose as directed. Use nightly for sinus hygiene long-term.  Can also be used as many times daily as desired to assist with clearing congested sinuses. 127 mL 11   senna-docusate (SENOKOT-S) 8.6-50 MG tablet Take 1 tablet by mouth daily. 10 tablet 0   tiZANidine (ZANAFLEX) 4 MG tablet Take 4 mg by mouth every 6 (six) hours as needed for muscle spasms.      traZODone (DESYREL) 50 MG tablet at bedtime as needed. Patient reported that she takes half of a tablet.     UBRELVY 100 MG  TABS Take by mouth.     venlafaxine XR (EFFEXOR-XR) 150 MG 24 hr capsule Take 2 capsules (300 mg total) by mouth daily before breakfast. 60 capsule 11   Vitamin A 2400 MCG (8000 UT) TABS      Zinc 50 MG TABS      zonisamide (ZONEGRAN) 100 MG capsule Take 100 mg by mouth at bedtime.     No current facility-administered medications for this visit.   No results found.  Review of Systems:   A ROS was performed including pertinent positives and negatives as documented in the HPI.  Physical Exam :   Constitutional: NAD and appears stated age Neurological: Alert and oriented Psych: Appropriate affect and cooperative Last menstrual period 08/26/2018.   Comprehensive Musculoskeletal Exam:      Musculoskeletal Exam  Gait Normal  Alignment Normal   Right Left  Inspection Normal Normal  Palpation    Tenderness Lateral femoral condyle None  Crepitus None None  Effusion Mild None  Range of Motion    Extension 0 -3  Flexion 135 135  Strength    Extension 5/5 5/5  Flexion 5/5 5/5  Ligament Exam     Generalized Laxity No No  Lachman Negative Negative   Pivot Shift Negative Negative  Anterior Drawer Negative Negative  Valgus at 0 Negative Negative  Valgus at 20 Negative Negative  Varus at 0 0 0  Varus at 20   0 0  Posterior Drawer at 90 0 0  Vascular/Lymphatic Exam    Edema None None  Venous Stasis Changes No No  Distal Circulation Normal Normal  Neurologic    Light Touch Sensation Intact Intact  Special Tests:       Imaging:   Xray (3 views right knee): Normal  MRI (right knee): There is a focus of cystic appearing changes in the posterior lateral femoral condyle consistent with a focus of AVN.  There is some surrounding edema.  There is a small chondral lesion involving the posterior femoral condyle although this is overall mild   I personally reviewed and interpreted the radiographs.   Assessment and Plan:   43 y.o. female with complex right knee pain.  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  She has had a similar condition in the ankle for which she underwent bone marrow BMAC placement for which she did extremely well.  I did discuss that overall I do not see any large chondral changes that I believe would be the underlying etiology of this although I do believe she would benefit from diagnostic arthroscopy and possible abrasion arthroplasty to address any type of chondral issues.  I do not see any obvious meniscal injury.  She has trialed an injection which gave her no relief.  She has also trialed physical therapy again without any relief.  Given her persistent knee pain and inability to stay active overall I do believe she would benefit from intervention.  I discussed the risks and limitations as well as associated rehab.  -Plan for right knee lateral femoral condyle chondroplasty, bone marrow aspirate concentrate aspiration right iliac crest, decompression and bone marrow placement right lateral femur   After a lengthy discussion of treatment options, including risks, benefits, alternatives, complications of surgical and nonsurgical conservative options, the patient elected surgical repair.   The patient  is aware of the material risks  and complications including, but not limited to injury to adjacent structures, neurovascular injury, infection, numbness, bleeding, implant failure, thermal burns, stiffness, persistent pain, failure to heal, disease transmission from allograft, need for further surgery, dislocation, anesthetic risks, blood clots, risks of death,and others. The probabilities of surgical success and failure discussed with patient given their particular co-morbidities.The time and nature of expected rehabilitation and recovery was discussed.The patient's questions were all answered preoperatively.  No barriers to understanding were noted. I explained the  natural history of the disease process and Rx rationale.  I explained to the patient what I considered to be reasonable expectations given their personal situation.  The final treatment plan was arrived at through a shared patient decision making process model.    I personally saw and evaluated the patient, and participated in the management and treatment plan.  [name], MD Attending Physician, Orthopedic Surgery  This document was dictated using Dragon voice recognition software. A reasonable attempt at proof reading has been made to minimize errors.

## 2023-12-21 ENCOUNTER — Encounter (HOSPITAL_BASED_OUTPATIENT_CLINIC_OR_DEPARTMENT_OTHER): Payer: Self-pay | Admitting: Orthopaedic Surgery

## 2023-12-21 ENCOUNTER — Other Ambulatory Visit: Payer: Self-pay | Admitting: Cardiovascular Disease

## 2023-12-23 ENCOUNTER — Encounter (HOSPITAL_BASED_OUTPATIENT_CLINIC_OR_DEPARTMENT_OTHER): Payer: Self-pay | Admitting: Orthopaedic Surgery

## 2023-12-24 NOTE — Telephone Encounter (Signed)
 Patient was given first available surgery appointment. There is only 1 week between her second week of PT and when surgery is scheduled. Plus I have her name on my desk in case anyone cancels I will call to move her surgery up. No, there is nothing sooner.

## 2023-12-27 ENCOUNTER — Encounter (HOSPITAL_BASED_OUTPATIENT_CLINIC_OR_DEPARTMENT_OTHER): Payer: Self-pay | Admitting: Orthopaedic Surgery

## 2023-12-28 ENCOUNTER — Ambulatory Visit (HOSPITAL_BASED_OUTPATIENT_CLINIC_OR_DEPARTMENT_OTHER): Payer: BC Managed Care – PPO | Admitting: Student

## 2023-12-28 ENCOUNTER — Ambulatory Visit (HOSPITAL_BASED_OUTPATIENT_CLINIC_OR_DEPARTMENT_OTHER): Payer: BC Managed Care – PPO | Admitting: Orthopaedic Surgery

## 2023-12-28 DIAGNOSIS — M25562 Pain in left knee: Secondary | ICD-10-CM | POA: Diagnosis not present

## 2023-12-28 DIAGNOSIS — G8929 Other chronic pain: Secondary | ICD-10-CM

## 2023-12-28 MED ORDER — LIDOCAINE HCL 1 % IJ SOLN
4.0000 mL | INTRAMUSCULAR | Status: AC | PRN
  Administered 2023-12-28: 4 mL

## 2023-12-28 MED ORDER — TRIAMCINOLONE ACETONIDE 40 MG/ML IJ SUSP
2.0000 mL | INTRAMUSCULAR | Status: AC | PRN
  Administered 2023-12-28: 2 mL via INTRA_ARTICULAR

## 2023-12-28 NOTE — Progress Notes (Signed)
 Chief Complaint: Left knee pain     History of Present Illness:    Diane [name] is a 43 y.o. female here today for evaluation of left knee pain.  She denies any injury to this left knee however is currently in a hinged brace of the right knee with a scheduled right knee arthroscopy upcoming on 01/19/2024.  Due to this she has been favoring the right knee and believes her left has been overworked.  This is most painful while weightbearing.  Symptoms are located mainly over the anterior medial knee.  Denies any numbness or tingling.   Surgical History:   None  PMH/PSH/Family History/Social History/Meds/Allergies:    Past Medical History:  Diagnosis Date   ADD (attention deficit disorder with hyperactivity)    Allergy     Anxiety    Arthritis    Atypical chest pain 07/26/2021   Atypical chest pain     Benign intracranial hypertension 06/16/2008   Qualifier: Diagnosis of   By: Zimonjic, Milica         Choking    due to food and pills get stuck   Cholelithiasis and acute cholecystitis without obstruction 12/02/2016   Chronic cough 12/27/2013   Depression    Dry mouth 08/12/2017   Dysmenorrhea    Family history of adverse reaction to anesthesia    pt's father has hx. of being hard to wake up post-op   Gastric ulcer 03/16/2015   GERD (gastroesophageal reflux disease)    Headache 07/09/2007   Qualifier: Diagnosis of   By: Penney Bowling MD, Bruce      Replacing diagnoses that were inactivated after the 02/16/23 regulatory import     History of gastric ulcer    History of kidney stones    Hyperlipidemia    IBS (irritable bowel syndrome)    Lower extremity edema 07/26/2021   Lower extremity edema     Migraines    neurologist-  dr c. hagen ([facility] in [place])-- treated with Emgality injection every 30 days   Mild obstructive sleep apnea    per study in epic 03-21-2017 mild osa, recommendation cpap, mouth appliance, wt loss   Nausea  and vomiting 12/03/2016   OAB (overactive bladder) 08/12/2017   Osteoarthritis    right ankle   Osteochondral lesion 09/2016   right ankle   Osteomyelitis of knee region [facility])    Peripheral vascular disease (HCC)    NERVE DAMAGE RIGHT LEG AND FOOT DUE TO INJURGY.    Plantar fasciitis of left foot    Pleural effusion 12/15/2013   PONV (postoperative nausea and vomiting)    AND HEADACHES   Pseudotumor cerebri    at back head   RUQ pain    S/P dilatation of esophageal stricture 07/2018   Sinusitis 08/12/2018   RESOLVED WITH ANTIOBIOTIC   SUI (stress urinary incontinence, female)    Wears contact lenses    Wears partial dentures    upper   Past Surgical History:  Procedure Laterality Date   ANKLE ARTHROSCOPY Right 10/22/2016   Procedure: ANKLE ARTHROSCOPY;  Surgeon: Charity Conch, DPM;  Location:  SURGERY CENTER;  Service: Podiatry;  Laterality: Right;  GENERAL/REG BLOCK   ANKLE ARTHROSCOPY Right 09-15-2017   @[facility]    BLADDER SURGERY  child   x 2 - as a child  to stretch bladder   CESAREAN SECTION  12/24/2009   CESAREAN SECTION  11/20/2011   Procedure: CESAREAN SECTION;  Surgeon: Camillo Celestine, MD;  Location: [facility];  Service: Gynecology;  Laterality: N/A;   CHOLECYSTECTOMY N/A 12/03/2016   Procedure: LAPAROSCOPIC CHOLECYSTECTOMY WITH INTRAOPERATIVE CHOLANGIOGRAM;  Surgeon: Oralee Billow, MD;  Location: [facility];  Service: General;  Laterality: N/A;   CYSTOSCOPY WITH RETROGRADE PYELOGRAM, URETEROSCOPY AND STENT PLACEMENT Left 08/10/2006   ESOPHAGOGASTRODUODENOSCOPY (EGD) WITH ESOPHAGEAL DILATION  07/2018   HARDWARE REMOVAL Right 07/01/2016   Procedure: HARDWARE REMOVAL;  Surgeon: Jasmine Mesi, MD;  Location: [facility];  Service: Orthopedics;  Laterality: Right;   KNEE ARTHROSCOPY WITH LATERAL RELEASE Right 12/13/2014   Procedure: KNEE ARTHROSCOPY WITH LATERAL RELEASE;  Surgeon: Forbes Ida., MD;  Location: [facility];  Service: Orthopedics;  Laterality:  Right;   LAPAROSCOPIC APPENDECTOMY N/A 06/11/2014   Procedure: APPENDECTOMY LAPAROSCOPIC;  Surgeon: Keitha Pata, MD;  Location: [facility];  Service: General;  Laterality: N/A;   ORIF ANKLE FRACTURE Right 01/05/2016   Procedure: OPEN REDUCTION INTERNAL FIXATION (ORIF) ANKLE FRACTURE;  Surgeon: Jasmine Mesi, MD;  Location: [facility];  Service: Orthopedics;  Laterality: Right;   OVARIAN CYST SURGERY Right 03/05/2007   AND CHROMOPERTUBATION  VIA LAPAROSCOPY   ROBOTIC ASSISTED LAPAROSCOPIC HYSTERECTOMY AND SALPINGECTOMY Bilateral 08/27/2018   Procedure: XI ROBOTIC ASSISTED LAPAROSCOPIC HYSTERECTOMY AND BILATERAL SALPINGECTOMY;  Surgeon: Meriam Stamp, MD;  Location: [facility];  Service: Gynecology;  Laterality: Bilateral;  [facility] for 23hr OBS   SYNOVECTOMY Right 12/13/2014   Procedure: PLICA SYNOVECTOMY;  Surgeon: Forbes Ida., MD;  Location: [facility];  Service: Orthopedics;  Laterality: Right;   TONSILLECTOMY AND ADENOIDECTOMY  child   WISDOM TOOTH EXTRACTION     Social History   Socioeconomic History   Marital status: Married    Spouse name: Not on file   Number of children: 2   Years of education: Not on file   Highest education level: Not on file  Occupational History   Occupation: Investment banker, 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  Tobacco Use   Smoking status: Former    Current packs/day: 0.50    Average packs/day: 0.5 packs/day for 15.0 years (7.5 ttl pk-yrs)    Types: Cigarettes   Smokeless tobacco: Never  Vaping Use   Vaping status: Every Day  Substance and Sexual Activity   Alcohol use: Yes    Comment: occasional   Drug use: No   Sexual activity: Yes    Birth control/protection: None, Other-see comments    Comment: partial hysterectomy  Other Topics Concern   Not on file  Social History Narrative   Not on file   Social Drivers of Health   Financial Resource Strain: Not on file  Food Insecurity: Not on file  Transportation Needs: Not on file   Physical Activity: Not on file  Stress: Not on file  Social Connections: Unknown (03/26/2022)   [facility]   Social Network    Social Network: Not on file   Family History  Problem Relation Age of Onset   Learning disabilities Mother    Asthma Mother    Arthritis Mother    Irritable bowel syndrome Mother    Miscarriages / [place] Mother    Hypertension Father    Early death Father    Drug abuse Father    Depression Father    Cancer  Father    Arthritis Father    Alcohol abuse Father    Brain cancer Father    Anesthesia problems Father        hard to wake up post-op   Lung cancer Father    Hyperlipidemia Maternal Grandmother    Hypertension Maternal Grandmother    Cancer Maternal Grandmother    Asthma Maternal Grandmother    Arthritis Maternal Grandmother    Breast cancer Maternal Grandmother    Ovarian cancer Maternal Grandmother    Diabetes Maternal Grandmother    Heart disease Maternal Grandmother    Heart attack Maternal Grandmother    Hypertension Maternal Grandfather    Hyperlipidemia Maternal Grandfather    Arthritis Maternal Grandfather    Alcohol abuse Maternal Grandfather    Cancer Maternal Grandfather    Prostate cancer Maternal Grandfather    Melanoma Maternal Grandfather    Cancer - Prostate Maternal Grandfather    Hypertension Paternal Grandmother    COPD Paternal Grandmother    Arthritis Paternal Grandmother    Learning disabilities Daughter    Learning disabilities Son    Colon cancer Neg Hx    Allergies  Allergen Reactions   Nickel Rash    "any jewelry that's not real"/ rash and itching    Prochlorperazine Other (See Comments)   Nsaids Other (See Comments)    DUE TO HISTORY OF GASTRIC ULCER   Tolmetin Itching and Other (See Comments)    DUE TO HISTORY OF GASTRIC ULCER  DUE TO HISTORY OF GASTRIC ULCER  DUE TO HISTORY OF GASTRIC ULCER, DUE TO HISTORY OF GASTRIC ULCER   Oxybutynin  Other (See Comments)     "severe dry mouth"   Adhesive [Tape] Rash   Metoclopramide  Hcl Other (See Comments)    RESTLESSNESS/JITTERY   Prochlorperazine Edisylate Other (See Comments)    RESTLESSNESS/JITTERY    Sulfa Antibiotics Rash   Current Outpatient Medications  Medication Sig Dispense Refill   Fezolinetant  45 MG TABS Take 1 tablet (45 mg total) by mouth daily. Can be taken with or without food. 90 tablet 3   acetaminophen  (TYLENOL ) 500 MG tablet Take 1 tablet (500 mg total) by mouth every 8 (eight) hours for 10 days. 30 tablet 0   ADDERALL XR 20 MG 24 hr capsule Take 20 mg by mouth every morning.  0   albuterol  (PROVENTIL  HFA;VENTOLIN  HFA) 108 (90 Base) MCG/ACT inhaler Inhale 2 puffs into the lungs every 6 (six) hours as needed for wheezing or shortness of breath.      ALPRAZolam (XANAX) 0.25 MG tablet SMARTSIG:0.5 Tablet(s) By Mouth PRN     amoxicillin -clavulanate (AUGMENTIN ) 875-125 MG tablet Take 1 tablet by mouth 2 (two) times daily. 14 tablet 0   aspirin  EC 81 MG tablet Take 1 tablet (81 mg total) by mouth daily. Swallow whole. 14 tablet 0   aspirin  EC 81 MG tablet Take 1 tablet (81 mg total) by mouth daily. Swallow whole. 30 tablet 12   atorvastatin  (LIPITOR) 40 MG tablet Take 1 tablet (40 mg total) by mouth daily. 15 tablet 0   Bacillus Coagulans-Inulin (PROBIOTIC) 1-250 BILLION-MG CAPS      cetirizine (ZYRTEC) 10 MG tablet Take by mouth.     EMGALITY 120 MG/ML SOAJ Inject 120 mg into the skin every 30 (thirty) days.  4   fluticasone  (FLONASE ) 50 MCG/ACT nasal spray Place into the nose.     fluticasone  (FLONASE ) 50 MCG/ACT nasal spray Place 2 sprays into both nostrils daily. 16 g 6  gabapentin  (NEURONTIN ) 300 MG capsule TAKE 2 CAPSULES BY MOUTH 3 TIMES DAILY (Patient taking differently: Take 600-1,200 mg by mouth See admin instructions. Take 600mg  by mouth in the morning and 1,200mg  in the evening) 180 capsule 4   LINZESS 290 MCG CAPS capsule Take 290 mcg by mouth daily.     loratadine  (CLARITIN ) 10  MG tablet Take 1 tablet (10 mg total) by mouth daily. 30 tablet 11   magnesium  oxide (MAG-OX) 400 MG tablet Take by mouth.     mirtazapine (REMERON) 30 MG tablet Take 30 mg by mouth daily.     Multiple Vitamins-Minerals (EQL CENTURY WOMENS) TABS      omeprazole (PRILOSEC) 40 MG capsule Take 40 mg by mouth daily.     oxyCODONE  (ROXICODONE ) 5 MG immediate release tablet Take 1 tablet (5 mg total) by mouth every 4 (four) hours as needed for severe pain (pain score 7-10) or breakthrough pain. 10 tablet 0   Polyethylene Glycol 3350  (MIRALAX  PO) Take by mouth at bedtime.     promethazine  (PHENERGAN ) 25 MG tablet Take 1 tablet (25 mg total) by mouth every 8 (eight) hours as needed for nausea or vomiting. 10 tablet 0   pseudoephedrine  (SUDAFED 12 HOUR) 120 MG 12 hr tablet Take 1 tablet (120 mg total) by mouth 2 (two) times daily. 20 tablet 0   Rimegepant Sulfate (NURTEC) 75 MG TBDP Nurtec ODT 75 mg disintegrating tablet  TAKE ONE TABLET BY MOUTH AS NEEDED. one PER 24 hours     Saline (SIMPLY SALINE) 0.9 % AERS Place 2 each into the nose as directed. Use nightly for sinus hygiene long-term.  Can also be used as many times daily as desired to assist with clearing congested sinuses. 127 mL 11   senna-docusate (SENOKOT-S) 8.6-50 MG tablet Take 1 tablet by mouth daily. 10 tablet 0   tiZANidine  (ZANAFLEX ) 4 MG tablet Take 4 mg by mouth every 6 (six) hours as needed for muscle spasms.      traZODone (DESYREL) 50 MG tablet at bedtime as needed. Patient reported that she takes half of a tablet.     UBRELVY 100 MG TABS Take by mouth.     venlafaxine  XR (EFFEXOR -XR) 150 MG 24 hr capsule Take 2 capsules (300 mg total) by mouth daily before breakfast. 60 capsule 11   Vitamin A 2400 MCG (8000 UT) TABS      Zinc 50 MG TABS      zonisamide (ZONEGRAN) 100 MG capsule Take 100 mg by mouth at bedtime.     No current facility-administered medications for this visit.   No results found.  Review of Systems:   A ROS was  performed including pertinent positives and negatives as documented in the HPI.  Physical Exam :   Constitutional: NAD and appears stated age Neurological: Alert and oriented Psych: Appropriate affect and cooperative Last menstrual period 08/26/2018.   Comprehensive Musculoskeletal Exam:    Left knee exam demonstrates tenderness to palpation over the medial joint line.  Active range of motion from 0 to 120 degrees without crepitus.  No laxity with varus or valgus stress.  Negative for overlying erythema or warmth.  Imaging:    Assessment:   43 y.o. female with acute left knee pain likely from compensation due to current issues with her right knee.  Denies any injury or mechanical symptoms I do not believe there is need for further workup at this time.  Patient is requesting a cortisone injection today which I feel  is reasonable to give her pain relief, particularly as surgery is still another 4 weeks out.  Cortisone injection was performed of the left knee today in clinic without any complication and she tolerated this well.  Will plan to assess relief with this and follow-up for the left knee as needed.  She is scheduled to begin physical therapy for the right knee prior to surgery.  Plan :    - Left knee cortisone injection performed today - Follow up in clinic as needed     Procedure Note  Patient: Diane [name]             Date of Birth: November 12, 1981           MRN: [id number]             Visit Date: 12/28/2023  Procedures: Visit Diagnoses:  1. Acute pain of left knee     Large Joint Inj: L knee on 12/28/2023 11:44 AM Indications: pain Details: 22 G 1.5 in needle, anterolateral approach Medications: 4 mL lidocaine  1 %; 2 mL triamcinolone  acetonide 40 MG/ML Outcome: tolerated well, no immediate complications Procedure, treatment alternatives, risks and benefits explained, specific risks discussed. Consent was given by the patient. Immediately prior to procedure a time out was  called to verify the correct patient, procedure, equipment, support staff and site/side marked as required. Patient was prepped and draped in the usual sterile fashion.     I personally saw and evaluated the patient, and participated in the management and treatment plan.  Sharrell Deck, PA-C Orthopedics

## 2023-12-29 ENCOUNTER — Ambulatory Visit: Payer: BC Managed Care – PPO | Admitting: Physical Therapy

## 2023-12-29 ENCOUNTER — Ambulatory Visit (HOSPITAL_BASED_OUTPATIENT_CLINIC_OR_DEPARTMENT_OTHER): Payer: BC Managed Care – PPO | Admitting: Student

## 2023-12-31 ENCOUNTER — Ambulatory Visit: Payer: BC Managed Care – PPO | Attending: Orthopaedic Surgery | Admitting: Physical Therapy

## 2023-12-31 DIAGNOSIS — M25661 Stiffness of right knee, not elsewhere classified: Secondary | ICD-10-CM | POA: Diagnosis present

## 2023-12-31 DIAGNOSIS — R262 Difficulty in walking, not elsewhere classified: Secondary | ICD-10-CM

## 2023-12-31 DIAGNOSIS — M25561 Pain in right knee: Secondary | ICD-10-CM

## 2023-12-31 DIAGNOSIS — M6281 Muscle weakness (generalized): Secondary | ICD-10-CM

## 2023-12-31 DIAGNOSIS — M8700 Idiopathic aseptic necrosis of unspecified bone: Secondary | ICD-10-CM | POA: Diagnosis present

## 2023-12-31 NOTE — Therapy (Signed)
OUTPATIENT PHYSICAL THERAPY LOWER EXTREMITY EVALUATION   Patient Name: Diane [name] MRN: [id number] DOB:Oct 11, 1981, 43 y.o., female Today's Date: 12/31/2023  END OF SESSION:  PT End of Session - 12/31/23 0810     Visit Number 1    Date for PT Re-Evaluation 01/28/24    Authorization Type [employer] 20 VL    PT Start Time 0806    PT Stop Time 0845    PT Time Calculation (min) 39 min    Activity Tolerance Patient tolerated treatment well             Past Medical History:  Diagnosis Date   ADD (attention deficit disorder with hyperactivity)    Allergy    Anxiety    Arthritis    Atypical chest pain 07/26/2021   Atypical chest pain     Benign intracranial hypertension 06/16/2008   Qualifier: Diagnosis of   By: Nira Conn         Choking    due to food and pills get stuck   Cholelithiasis and acute cholecystitis without obstruction 12/02/2016   Chronic cough 12/27/2013   Depression    Dry mouth 08/12/2017   Dysmenorrhea    Family history of adverse reaction to anesthesia    pt's father has hx. of being hard to wake up post-op   Gastric ulcer 03/16/2015   GERD (gastroesophageal reflux disease)    Headache 07/09/2007   Qualifier: Diagnosis of   By: Cato Mulligan MD, Bruce      Replacing diagnoses that were inactivated after the 02/16/23 regulatory import     History of gastric ulcer    History of kidney stones    Hyperlipidemia    IBS (irritable bowel syndrome)    Lower extremity edema 07/26/2021   Lower extremity edema     Migraines    neurologist-  dr c. Sharene Skeans ([facility] in [place])-- treated with Emgality injection every 30 days   Mild obstructive sleep apnea    per study in epic 03-21-2017 mild osa, recommendation cpap, mouth appliance, wt loss   Nausea and vomiting 12/03/2016   OAB (overactive bladder) 08/12/2017   Osteoarthritis    right ankle   Osteochondral lesion 09/2016   right ankle   Osteomyelitis of knee region [facility])    Peripheral  vascular disease (HCC)    NERVE DAMAGE RIGHT LEG AND FOOT DUE TO INJURGY.    Plantar fasciitis of left foot    Pleural effusion 12/15/2013   PONV (postoperative nausea and vomiting)    AND HEADACHES   Pseudotumor cerebri    at back head   RUQ pain    S/P dilatation of esophageal stricture 07/2018   Sinusitis 08/12/2018   RESOLVED WITH ANTIOBIOTIC   SUI (stress urinary incontinence, female)    Wears contact lenses    Wears partial dentures    upper   Past Surgical History:  Procedure Laterality Date   ANKLE ARTHROSCOPY Right 10/22/2016   Procedure: ANKLE ARTHROSCOPY;  Surgeon: Vivi Barrack, DPM;  Location: [facility];  Service: Podiatry;  Laterality: Right;  GENERAL/REG BLOCK   ANKLE ARTHROSCOPY Right 09-15-2017   @[facility]    BLADDER SURGERY  child   x 2 - as a child to stretch bladder   CESAREAN SECTION  12/24/2009   CESAREAN SECTION  11/20/2011   Procedure: CESAREAN SECTION;  Surgeon: Lenoard Aden, MD;  Location: [facility];  Service: Gynecology;  Laterality: N/A;   CHOLECYSTECTOMY N/A 12/03/2016   Procedure: LAPAROSCOPIC CHOLECYSTECTOMY  WITH INTRAOPERATIVE CHOLANGIOGRAM;  Surgeon: Darnell Level, MD;  Location: [facility];  Service: General;  Laterality: N/A;   CYSTOSCOPY WITH RETROGRADE PYELOGRAM, URETEROSCOPY AND STENT PLACEMENT Left 08/10/2006   ESOPHAGOGASTRODUODENOSCOPY (EGD) WITH ESOPHAGEAL DILATION  07/2018   HARDWARE REMOVAL Right 07/01/2016   Procedure: HARDWARE REMOVAL;  Surgeon: Cammy Copa, MD;  Location: [facility];  Service: Orthopedics;  Laterality: Right;   KNEE ARTHROSCOPY WITH LATERAL RELEASE Right 12/13/2014   Procedure: KNEE ARTHROSCOPY WITH LATERAL RELEASE;  Surgeon: Thera Flake., MD;  Location: [facility];  Service: Orthopedics;  Laterality: Right;   LAPAROSCOPIC APPENDECTOMY N/A 06/11/2014   Procedure: APPENDECTOMY LAPAROSCOPIC;  Surgeon: Velora Heckler, MD;  Location: [facility];  Service: General;  Laterality: N/A;   ORIF ANKLE FRACTURE  Right 01/05/2016   Procedure: OPEN REDUCTION INTERNAL FIXATION (ORIF) ANKLE FRACTURE;  Surgeon: Cammy Copa, MD;  Location: [facility];  Service: Orthopedics;  Laterality: Right;   OVARIAN CYST SURGERY Right 03/05/2007   AND CHROMOPERTUBATION  VIA LAPAROSCOPY   ROBOTIC ASSISTED LAPAROSCOPIC HYSTERECTOMY AND SALPINGECTOMY Bilateral 08/27/2018   Procedure: XI ROBOTIC ASSISTED LAPAROSCOPIC HYSTERECTOMY AND BILATERAL SALPINGECTOMY;  Surgeon: Olivia Mackie, MD;  Location: [facility];  Service: Gynecology;  Laterality: Bilateral;  [facility] for 23hr OBS   SYNOVECTOMY Right 12/13/2014   Procedure: PLICA SYNOVECTOMY;  Surgeon: Thera Flake., MD;  Location: [facility];  Service: Orthopedics;  Laterality: Right;   TONSILLECTOMY AND ADENOIDECTOMY  child   WISDOM TOOTH EXTRACTION     Patient Active Problem List   Diagnosis Date Noted   Prediabetes 07/14/2023   Weight disorder 07/14/2023   Ankle swelling 07/14/2023   Menopausal syndrome (hot flushes) 07/13/2023   Hyperlipidemia 06/19/2023   Intractable pain 06/17/2023   Chronic constipation 06/17/2023   Other idiopathic scoliosis, lumbar region 06/17/2023   Dysphagia 08/12/2017   Osteochondral lesion 06/03/2017   OSA (obstructive sleep apnea) 03/24/2017   Ankle pain, chronic 01/05/2016   Knee pain, right, chronic 04/12/2015   Chronic migraine without aura 04/01/2013   DEPRESSION, ACUTE 10/02/2008   Opioid use disorder in remission 10/02/2008   back pain, chronic 03/28/2008   Obesity (BMI 30-39.9) 01/25/2008   Anxiety state 11/09/2007    PCP: Glenetta Hew MD  REFERRING PROVIDER: [name] MD  REFERRING DIAG: M87.00 avascular necrosis of bone  THERAPY DIAG:   Rationale for Evaluation and Treatment: Rehabilitation  ONSET DATE: worse since Christmas  SUBJECTIVE:   SUBJECTIVE STATEMENT: Had exploratory knee surgery 20 years ago and it hasn't been right since.  More frequent knee buckling (goes back too straight)  around Christmas and is now almost daily.  Wearing a hinged knee brace for stability when up. Scheduled 01/19/24 for  right knee lateral femoral condyle chondroplasty, bone marrow aspirate concentrate aspiration right iliac crest, decompression and bone marrow placement right lateral femur; recent left knee cortisone injection 2/10 (helped a lot)  After surgery Will stay with mom for 2 weeks after surgery, has a ramp  Will be 2 weeks NWB after surgery; fell in past using crutches and had to have emergency surgery in past  Mom having a TKR in March (she is my help)  PERTINENT HISTORY: right ankle fracture for which she underwent bone marrow BMAC placement  PAIN:   Are you having pain? Yes NPRS scale: 5/10 Pain location: right knee Pain orientation: Right  PAIN TYPE: throbbing Pain description: constant  Aggravating factors: being up on it, moving Relieving factors: wearing brace for  support  PRECAUTIONS: None   WEIGHT BEARING RESTRICTIONS: No  FALLS:  Has patient fallen in last 6 months? No  LIVING ENVIRONMENT: Lives with: lives with their family Lives in: House/apartment Stairs: bedroom upstairs Has following equipment at home: Crutches  OCCUPATION: stay at home mom  PLOF: Independent  PATIENT GOALS: prepare for upcoming surgery to help with recovery  NEXT MD VISIT: surgery  OBJECTIVE:  Note: Objective measures were completed at Evaluation unless otherwise noted.  DIAGNOSTIC FINDINGS: MRI today is consistent with a focus of avascular necrosis involving the distal lateral femoral condyle.   PATIENT SURVEYS:  LEFS 33/80  COGNITION: Overall cognitive status: Within functional limits for tasks assessed     EDEMA:  Mild peripatellar edema Noted right calf atrophy   LOWER EXTREMITY ROM:  Active ROM Right eval Left eval  Hip flexion    Hip extension    Hip abduction    Hip adduction    Hip internal rotation    Hip external rotation    Knee flexion 125 135   Knee extension 0 painful 0  Ankle dorsiflexion    Ankle plantarflexion    Ankle inversion    Ankle eversion     (Blank rows = not tested)  LOWER EXTREMITY MMT: able to do SLR but painful; hip abduction 4/5   GAIT:  Comments: hinged knee brace                                                                                                                                TREATMENT DATE: 2/13  Eval and HEP instruction below  PATIENT EDUCATION:  Education details: Educated patient on anatomy and physiology of current symptoms, prognosis, plan of care as well as initial self care strategies to promote recovery Person educated: Patient Education method: Explanation Education comprehension: verbalized understanding  HOME EXERCISE PROGRAM: Access Code: [email or web address] URL: [email or web address] Date: 12/31/2023 Prepared by: Lavinia Sharps  Exercises - Supine Heel Slide with Strap (Mirrored)  - 1 x daily - 7 x weekly - 1-2 sets - 10 reps - Supine Knee Extension Strengthening  - 1 x daily - 7 x weekly - 1-2 sets - 10 reps - Active Straight Leg Raise with Quad Set  - 1 x daily - 7 x weekly - 1-2 sets - 10 reps - Clamshell  - 1 x daily - 7 x weekly - 1-2 sets - 10 reps - Prone Hip Extension  - 1 x daily - 7 x weekly - 1-2 sets - 10 reps - Prone Hip Extension with Bent Knee  - 1 x daily - 7 x weekly - 1-2 sets - 10 reps - Seated Long Arc Quad  - 1 x daily - 7 x weekly - 1-2 sets - 10 reps - Standing Heel Raises  - 1 x daily - 7 x weekly - 1-2 sets - 10 reps - Seated Plantar Fascia Stretch  - 1 x daily - 7  x weekly - 1 sets - 3 reps - 30 hold - Gastroc Stretch on Wall  - 1 x daily - 7 x weekly - 1 sets - 3 reps - 30 hold  ASSESSMENT:  CLINICAL IMPRESSION: Patient is a 43 y.o. female who was seen today for physical therapy evaluation and treatment for right knee pain due to avascular necrosis. She is referred to PT for quad and hip strengthening to prepare for upcoming surgery in  3 weeks.  The patient demonstrates decreased knee flexion and painful extension range of motion.   Strength deficits and asymmetry with opposite knee include decreased quad strength, calf and gluteal strength.  .  These deficits and pain cause functional impairments with standing, walking, going up and down curbs and steps at home and in the community.     OBJECTIVE IMPAIRMENTS: decreased activity tolerance, decreased mobility, difficulty walking, decreased ROM, decreased strength, increased edema, impaired perceived functional ability, and pain.   ACTIVITY LIMITATIONS: bending, standing, stairs, locomotion level, and caring for others  PARTICIPATION LIMITATIONS: meal prep, cleaning, laundry, shopping, and community activity  PERSONAL FACTORS: Time since onset of injury/illness/exacerbation and 1 comorbidity: right ankle surgery and ongoing plantar fascitis  are also affecting patient's functional outcome.   REHAB POTENTIAL: Good  CLINICAL DECISION MAKING: Stable/uncomplicated  EVALUATION COMPLEXITY: Low   GOALS: Goals reviewed with patient? Yes   LONG TERM GOALS: Target date: 01/28/2024   The patient will demonstrate knowledge of basic self care strategies and exercises to promote healing post surgically Baseline:  Goal status: INITIAL      PLAN:  PT FREQUENCY: 1x/week  PT DURATION: 4 weeks  PLANNED INTERVENTIONS: 97164- PT Re-evaluation, 97110-Therapeutic exercises, 97530- Therapeutic activity, 97112- Neuromuscular re-education, 97535- Self Care, 16109- Manual therapy, [id number]- Aquatic Therapy, 97014- Electrical stimulation (unattended), [id number]- Electrical stimulation (manual), 97016- Vasopneumatic device, 97035- Ultrasound, Patient/Family education, Balance training, Stair training, Taping, Dry Needling, Joint mobilization, Cryotherapy, and Moist heat  PLAN FOR NEXT SESSION: review prehab ex's; will limit visits prior to surgery secondary to insurance visit limit of 20  Lavinia Sharps, PT 12/31/23 6:44 PM Phone: [phone] Fax: [phone]

## 2024-01-04 ENCOUNTER — Encounter (HOSPITAL_BASED_OUTPATIENT_CLINIC_OR_DEPARTMENT_OTHER): Payer: Self-pay | Admitting: Orthopaedic Surgery

## 2024-01-04 NOTE — Telephone Encounter (Signed)
Patient also left a message requesting if she would need a walker following surgery. Please advise.

## 2024-01-06 ENCOUNTER — Other Ambulatory Visit (HOSPITAL_BASED_OUTPATIENT_CLINIC_OR_DEPARTMENT_OTHER): Payer: Self-pay

## 2024-01-06 ENCOUNTER — Encounter (HOSPITAL_BASED_OUTPATIENT_CLINIC_OR_DEPARTMENT_OTHER): Payer: Self-pay | Admitting: Orthopaedic Surgery

## 2024-01-06 ENCOUNTER — Ambulatory Visit (HOSPITAL_BASED_OUTPATIENT_CLINIC_OR_DEPARTMENT_OTHER): Payer: BC Managed Care – PPO | Admitting: Student

## 2024-01-06 DIAGNOSIS — M25562 Pain in left knee: Secondary | ICD-10-CM | POA: Diagnosis not present

## 2024-01-06 DIAGNOSIS — M5416 Radiculopathy, lumbar region: Secondary | ICD-10-CM

## 2024-01-06 MED ORDER — METHYLPREDNISOLONE 4 MG PO TBPK
ORAL_TABLET | ORAL | 0 refills | Status: DC
  Filled 2024-01-06: qty 21, 6d supply, fill #0

## 2024-01-06 NOTE — Progress Notes (Signed)
Chief Complaint: Right leg pain and numbness     History of Present Illness:   01/06/24: Patient presents today for evaluation of pain and numbness in her right leg.  She states that this began about 5 days ago without any known injury or cause.  She feels like her leg is "asleep" from the upper thigh down to the calf.  Her knee is swollen, painful, and has been buckling multiple times a day.  She rates pain at a 9/10.  She is scheduled for a right knee arthroscopic lateral femoral chondroplasty with Dr. Steward Drone on 01/19/2024 due to AVN.  She is wearing a hinged knee brace on the right knee.   Diane [name] is a 43 y.o. female presents today with ongoing right knee pain and has been persistent over the last several years.  She is here today as a referral and recommendation from her mother who has been seeing me.  She has had an MRI with Dr. Aundria Rud at [facility] and is here today for further discussion.  She states that the knee is continuing to buckle and give out.  This buckled very significantly on Christmas of this last year.  She was placed in a knee immobilizer at [facility].  She is here today for further discussion as she is having a difficult time with really any weightbearing and significant pain and swelling.  She is a stay-at-home mother.  She does enjoy being active although this is limited by her knee pain she has previously had an injection in this knee without any relief.  She has previously undergone physical therapy for strengthening of the knee again without any relief or change  PMH/PSH/Family History/Social History/Meds/Allergies:    Past Medical History:  Diagnosis Date   ADD (attention deficit disorder with hyperactivity)    Allergy    Anxiety    Arthritis    Atypical chest pain 07/26/2021   Atypical chest pain     Benign intracranial hypertension 06/16/2008   Qualifier: Diagnosis of   By: Zimonjic, Milica         Choking    due to food and pills get stuck    Cholelithiasis and acute cholecystitis without obstruction 12/02/2016   Chronic cough 12/27/2013   Depression    Dry mouth 08/12/2017   Dysmenorrhea    Family history of adverse reaction to anesthesia    pt's father has hx. of being hard to wake up post-op   Gastric ulcer 03/16/2015   GERD (gastroesophageal reflux disease)    Headache 07/09/2007   Qualifier: Diagnosis of   By: Cato Mulligan MD, Bruce      Replacing diagnoses that were inactivated after the 02/16/23 regulatory import     History of gastric ulcer    History of kidney stones    Hyperlipidemia    IBS (irritable bowel syndrome)    Lower extremity edema 07/26/2021   Lower extremity edema     Migraines    neurologist-  dr c. Sharene Skeans ([facility] in [place])-- treated with Emgality injection every 30 days   Mild obstructive sleep apnea    per study in epic 03-21-2017 mild osa, recommendation cpap, mouth appliance, wt loss   Nausea and vomiting 12/03/2016   OAB (overactive bladder) 08/12/2017   Osteoarthritis    right ankle   Osteochondral lesion 09/2016   right ankle   Osteomyelitis of knee region [facility])    Peripheral vascular disease (HCC)    NERVE DAMAGE RIGHT LEG AND  FOOT DUE TO INJURGY.    Plantar fasciitis of left foot    Pleural effusion 12/15/2013   PONV (postoperative nausea and vomiting)    AND HEADACHES   Pseudotumor cerebri    at back head   RUQ pain    S/P dilatation of esophageal stricture 07/2018   Sinusitis 08/12/2018   RESOLVED WITH ANTIOBIOTIC   SUI (stress urinary incontinence, female)    Wears contact lenses    Wears partial dentures    upper   Past Surgical History:  Procedure Laterality Date   ANKLE ARTHROSCOPY Right 10/22/2016   Procedure: ANKLE ARTHROSCOPY;  Surgeon: Vivi Barrack, DPM;  Location: [facility];  Service: Podiatry;  Laterality: Right;  GENERAL/REG BLOCK   ANKLE ARTHROSCOPY Right 09-15-2017   @[facility]    BLADDER SURGERY  child   x 2 - as a child to  stretch bladder   CESAREAN SECTION  12/24/2009   CESAREAN SECTION  11/20/2011   Procedure: CESAREAN SECTION;  Surgeon: Lenoard Aden, MD;  Location: [facility];  Service: Gynecology;  Laterality: N/A;   CHOLECYSTECTOMY N/A 12/03/2016   Procedure: LAPAROSCOPIC CHOLECYSTECTOMY WITH INTRAOPERATIVE CHOLANGIOGRAM;  Surgeon: Darnell Level, MD;  Location: [facility];  Service: General;  Laterality: N/A;   CYSTOSCOPY WITH RETROGRADE PYELOGRAM, URETEROSCOPY AND STENT PLACEMENT Left 08/10/2006   ESOPHAGOGASTRODUODENOSCOPY (EGD) WITH ESOPHAGEAL DILATION  07/2018   HARDWARE REMOVAL Right 07/01/2016   Procedure: HARDWARE REMOVAL;  Surgeon: Cammy Copa, MD;  Location: [facility];  Service: Orthopedics;  Laterality: Right;   KNEE ARTHROSCOPY WITH LATERAL RELEASE Right 12/13/2014   Procedure: KNEE ARTHROSCOPY WITH LATERAL RELEASE;  Surgeon: Thera Flake., MD;  Location: [facility];  Service: Orthopedics;  Laterality: Right;   LAPAROSCOPIC APPENDECTOMY N/A 06/11/2014   Procedure: APPENDECTOMY LAPAROSCOPIC;  Surgeon: Velora Heckler, MD;  Location: [facility];  Service: General;  Laterality: N/A;   ORIF ANKLE FRACTURE Right 01/05/2016   Procedure: OPEN REDUCTION INTERNAL FIXATION (ORIF) ANKLE FRACTURE;  Surgeon: Cammy Copa, MD;  Location: [facility];  Service: Orthopedics;  Laterality: Right;   OVARIAN CYST SURGERY Right 03/05/2007   AND CHROMOPERTUBATION  VIA LAPAROSCOPY   ROBOTIC ASSISTED LAPAROSCOPIC HYSTERECTOMY AND SALPINGECTOMY Bilateral 08/27/2018   Procedure: XI ROBOTIC ASSISTED LAPAROSCOPIC HYSTERECTOMY AND BILATERAL SALPINGECTOMY;  Surgeon: Olivia Mackie, MD;  Location: [facility];  Service: Gynecology;  Laterality: Bilateral;  [facility] for 23hr OBS   SYNOVECTOMY Right 12/13/2014   Procedure: PLICA SYNOVECTOMY;  Surgeon: Thera Flake., MD;  Location: [facility];  Service: Orthopedics;  Laterality: Right;   TONSILLECTOMY AND ADENOIDECTOMY  child   WISDOM TOOTH EXTRACTION     Social History    Socioeconomic History   Marital status: Married    Spouse name: Not on file   Number of children: 2   Years of education: Not on file   Highest education level: Not on file  Occupational History   Occupation: Investment banker, 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  Tobacco Use   Smoking status: Former    Current packs/day: 0.50    Average packs/day: 0.5 packs/day for 15.0 years (7.5 ttl pk-yrs)    Types: Cigarettes   Smokeless tobacco: Never  Vaping Use   Vaping status: Every Day  Substance and Sexual Activity   Alcohol use: Yes    Comment: occasional   Drug use: No   Sexual activity: Yes    Birth control/protection: None, Other-see comments  Comment: partial hysterectomy  Other Topics Concern   Not on file  Social History Narrative   Not on file   Social Drivers of Health   Financial Resource Strain: Not on file  Food Insecurity: Not on file  Transportation Needs: Not on file  Physical Activity: Not on file  Stress: Not on file  Social Connections: Unknown (03/26/2022)   Received from [employer], [facility]   Social Network    Social Network: Not on file   Family History  Problem Relation Age of Onset   Learning disabilities Mother    Asthma Mother    Arthritis Mother    Irritable bowel syndrome Mother    Miscarriages / Stillbirths Mother    Hypertension Father    Early death Father    Drug abuse Father    Depression Father    Cancer Father    Arthritis Father    Alcohol abuse Father    Brain cancer Father    Anesthesia problems Father        hard to wake up post-op   Lung cancer Father    Hyperlipidemia Maternal Grandmother    Hypertension Maternal Grandmother    Cancer Maternal Grandmother    Asthma Maternal Grandmother    Arthritis Maternal Grandmother    Breast cancer Maternal Grandmother    Ovarian cancer Maternal Grandmother    Diabetes Maternal Grandmother    Heart disease Maternal Grandmother    Heart attack Maternal  Grandmother    Hypertension Maternal Grandfather    Hyperlipidemia Maternal Grandfather    Arthritis Maternal Grandfather    Alcohol abuse Maternal Grandfather    Cancer Maternal Grandfather    Prostate cancer Maternal Grandfather    Melanoma Maternal Grandfather    Cancer - Prostate Maternal Grandfather    Hypertension Paternal Grandmother    COPD Paternal Grandmother    Arthritis Paternal Grandmother    Learning disabilities Daughter    Learning disabilities Son    Colon cancer Neg Hx    Allergies  Allergen Reactions   Nickel Rash    "any jewelry that's not real"/ rash and itching    Prochlorperazine Other (See Comments)   Nsaids Other (See Comments)    DUE TO HISTORY OF GASTRIC ULCER   Tolmetin Itching and Other (See Comments)    DUE TO HISTORY OF GASTRIC ULCER  DUE TO HISTORY OF GASTRIC ULCER  DUE TO HISTORY OF GASTRIC ULCER, DUE TO HISTORY OF GASTRIC ULCER   Oxybutynin Other (See Comments)    "severe dry mouth"   Adhesive [Tape] Rash   Metoclopramide Hcl Other (See Comments)    RESTLESSNESS/JITTERY   Prochlorperazine Edisylate Other (See Comments)    RESTLESSNESS/JITTERY    Sulfa Antibiotics Rash   Current Outpatient Medications  Medication Sig Dispense Refill   methylPREDNISolone (MEDROL DOSEPAK) 4 MG TBPK tablet Take per packet instructions 21 each 0   ADDERALL XR 20 MG 24 hr capsule Take 20 mg by mouth every morning.  0   albuterol (PROVENTIL HFA;VENTOLIN HFA) 108 (90 Base) MCG/ACT inhaler Inhale 2 puffs into the lungs every 6 (six) hours as needed for wheezing or shortness of breath.      ALPRAZolam (XANAX) 0.25 MG tablet SMARTSIG:0.5 Tablet(s) By Mouth PRN     amoxicillin-clavulanate (AUGMENTIN) 875-125 MG tablet Take 1 tablet by mouth 2 (two) times daily. 14 tablet 0   aspirin EC 81 MG tablet Take 1 tablet (81 mg total) by mouth daily. Swallow whole. 14 tablet 0  aspirin EC 81 MG tablet Take 1 tablet (81 mg total) by mouth daily. Swallow whole. 30 tablet 12    atorvastatin (LIPITOR) 40 MG tablet Take 1 tablet (40 mg total) by mouth daily. 15 tablet 0   Bacillus Coagulans-Inulin (PROBIOTIC) 1-250 BILLION-MG CAPS      cetirizine (ZYRTEC) 10 MG tablet Take by mouth.     EMGALITY 120 MG/ML SOAJ Inject 120 mg into the skin every 30 (thirty) days.  4   Fezolinetant 45 MG TABS Take 1 tablet (45 mg total) by mouth daily. Can be taken with or without food. 90 tablet 3   fluticasone (FLONASE) 50 MCG/ACT nasal spray Place into the nose.     fluticasone (FLONASE) 50 MCG/ACT nasal spray Place 2 sprays into both nostrils daily. 16 g 6   gabapentin (NEURONTIN) 300 MG capsule TAKE 2 CAPSULES BY MOUTH 3 TIMES DAILY (Patient taking differently: Take 600-1,200 mg by mouth See admin instructions. Take 600mg  by mouth in the morning and 1,200mg  in the evening) 180 capsule 4   LINZESS 290 MCG CAPS capsule Take 290 mcg by mouth daily.     loratadine (CLARITIN) 10 MG tablet Take 1 tablet (10 mg total) by mouth daily. 30 tablet 11   magnesium oxide (MAG-OX) 400 MG tablet Take by mouth.     mirtazapine (REMERON) 30 MG tablet Take 30 mg by mouth daily.     Multiple Vitamins-Minerals (EQL CENTURY WOMENS) TABS      omeprazole (PRILOSEC) 40 MG capsule Take 40 mg by mouth daily.     oxyCODONE (ROXICODONE) 5 MG immediate release tablet Take 1 tablet (5 mg total) by mouth every 4 (four) hours as needed for severe pain (pain score 7-10) or breakthrough pain. 10 tablet 0   Polyethylene Glycol 3350 (MIRALAX PO) Take by mouth at bedtime.     promethazine (PHENERGAN) 25 MG tablet Take 1 tablet (25 mg total) by mouth every 8 (eight) hours as needed for nausea or vomiting. 10 tablet 0   pseudoephedrine (SUDAFED 12 HOUR) 120 MG 12 hr tablet Take 1 tablet (120 mg total) by mouth 2 (two) times daily. 20 tablet 0   Rimegepant Sulfate (NURTEC) 75 MG TBDP Nurtec ODT 75 mg disintegrating tablet  TAKE ONE TABLET BY MOUTH AS NEEDED. one PER 24 hours     Saline (SIMPLY SALINE) 0.9 % AERS Place 2  each into the nose as directed. Use nightly for sinus hygiene long-term.  Can also be used as many times daily as desired to assist with clearing congested sinuses. 127 mL 11   senna-docusate (SENOKOT-S) 8.6-50 MG tablet Take 1 tablet by mouth daily. 10 tablet 0   tiZANidine (ZANAFLEX) 4 MG tablet Take 4 mg by mouth every 6 (six) hours as needed for muscle spasms.      traZODone (DESYREL) 50 MG tablet at bedtime as needed. Patient reported that she takes half of a tablet.     UBRELVY 100 MG TABS Take by mouth.     venlafaxine XR (EFFEXOR-XR) 150 MG 24 hr capsule Take 2 capsules (300 mg total) by mouth daily before breakfast. 60 capsule 11   Vitamin A 2400 MCG (8000 UT) TABS      Zinc 50 MG TABS      zonisamide (ZONEGRAN) 100 MG capsule Take 100 mg by mouth at bedtime.     No current facility-administered medications for this visit.   No results found.  Review of Systems:   A ROS was performed including pertinent  positives and negatives as documented in the HPI.  Physical Exam :   Constitutional: NAD and appears stated age Neurological: Alert and oriented Psych: Appropriate affect and cooperative Last menstrual period 08/26/2018.   Comprehensive Musculoskeletal Exam:    Tenderness to palpation over the midline of the lumbar spine and right-sided paraspinal muscles.  Positive right straight leg raise.  Knee flexion/extension and ankle dorsiflexion/plantarflexion strength is 5/5.  Patellar DTRs 2+ and equal.  Imaging:   Xray review from 06/17/2023 (lumbar spine 4 views): Mild disc space narrowing between L3-L4 and L4-L5.   I personally reviewed and interpreted the radiographs.   Assessment and Plan:   44 y.o. female with acute right leg pain and paresthesias.  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 Steward Drone in approximately 2 weeks.  Her current symptoms do appear more consistent with lumbar radiculopathy as symptoms are traveling from  the posterior/lateral hip down to the calf and is associated with new onset numbness and tingling.  Given her upcoming surgery, Dr. Steward Drone requests to get her in for a stat lumbar MRI for further assessment and referral to Dr. Alvester Morin for possible ESI to address the symptoms.  Will plan to start her on a Medrol Dosepak today for symptomatic relief.    I personally saw and evaluated the patient, and participated in the management and treatment plan.   Hazle Nordmann, PA-C Orthopedics

## 2024-01-07 ENCOUNTER — Ambulatory Visit (HOSPITAL_COMMUNITY): Payer: BC Managed Care – PPO

## 2024-01-07 ENCOUNTER — Other Ambulatory Visit (HOSPITAL_BASED_OUTPATIENT_CLINIC_OR_DEPARTMENT_OTHER): Payer: Self-pay | Admitting: Orthopaedic Surgery

## 2024-01-07 DIAGNOSIS — M25562 Pain in left knee: Secondary | ICD-10-CM

## 2024-01-08 ENCOUNTER — Ambulatory Visit
Admission: RE | Admit: 2024-01-08 | Discharge: 2024-01-08 | Disposition: A | Discharge status: Home / Self Care | Payer: BC Managed Care – PPO | Source: Ambulatory Visit | Attending: Student | Admitting: Student

## 2024-01-08 DIAGNOSIS — M5416 Radiculopathy, lumbar region: Secondary | ICD-10-CM

## 2024-01-10 ENCOUNTER — Encounter (HOSPITAL_BASED_OUTPATIENT_CLINIC_OR_DEPARTMENT_OTHER): Payer: Self-pay | Admitting: Orthopaedic Surgery

## 2024-01-11 ENCOUNTER — Encounter (HOSPITAL_BASED_OUTPATIENT_CLINIC_OR_DEPARTMENT_OTHER): Payer: Self-pay | Admitting: Orthopaedic Surgery

## 2024-01-11 ENCOUNTER — Other Ambulatory Visit: Payer: BC Managed Care – PPO

## 2024-01-11 ENCOUNTER — Other Ambulatory Visit: Payer: Self-pay

## 2024-01-11 ENCOUNTER — Other Ambulatory Visit (HOSPITAL_BASED_OUTPATIENT_CLINIC_OR_DEPARTMENT_OTHER): Payer: Self-pay | Admitting: Orthopaedic Surgery

## 2024-01-11 DIAGNOSIS — M5416 Radiculopathy, lumbar region: Secondary | ICD-10-CM

## 2024-01-12 ENCOUNTER — Encounter (HOSPITAL_BASED_OUTPATIENT_CLINIC_OR_DEPARTMENT_OTHER)
Admission: RE | Disposition: A | Discharge status: Home / Self Care | Payer: Self-pay | Source: Home / Self Care | Attending: Orthopaedic Surgery

## 2024-01-12 ENCOUNTER — Ambulatory Visit (HOSPITAL_BASED_OUTPATIENT_CLINIC_OR_DEPARTMENT_OTHER): Payer: Self-pay | Admitting: Anesthesiology

## 2024-01-12 ENCOUNTER — Ambulatory Visit (HOSPITAL_BASED_OUTPATIENT_CLINIC_OR_DEPARTMENT_OTHER)
Admission: RE | Admit: 2024-01-12 | Discharge: 2024-01-12 | Disposition: A | Discharge status: Home / Self Care | Payer: BC Managed Care – PPO | Attending: Orthopaedic Surgery | Admitting: Orthopaedic Surgery

## 2024-01-12 ENCOUNTER — Ambulatory Visit (HOSPITAL_COMMUNITY): Payer: BC Managed Care – PPO

## 2024-01-12 ENCOUNTER — Other Ambulatory Visit: Payer: Self-pay

## 2024-01-12 ENCOUNTER — Encounter (HOSPITAL_BASED_OUTPATIENT_CLINIC_OR_DEPARTMENT_OTHER): Payer: Self-pay | Admitting: Orthopaedic Surgery

## 2024-01-12 DIAGNOSIS — M238X1 Other internal derangements of right knee: Secondary | ICD-10-CM | POA: Diagnosis not present

## 2024-01-12 DIAGNOSIS — Z79899 Other long term (current) drug therapy: Secondary | ICD-10-CM | POA: Diagnosis not present

## 2024-01-12 DIAGNOSIS — G4730 Sleep apnea, unspecified: Secondary | ICD-10-CM | POA: Diagnosis not present

## 2024-01-12 DIAGNOSIS — M8700 Idiopathic aseptic necrosis of unspecified bone: Secondary | ICD-10-CM

## 2024-01-12 DIAGNOSIS — I10 Essential (primary) hypertension: Secondary | ICD-10-CM | POA: Diagnosis not present

## 2024-01-12 DIAGNOSIS — Z87891 Personal history of nicotine dependence: Secondary | ICD-10-CM | POA: Insufficient documentation

## 2024-01-12 DIAGNOSIS — M879 Osteonecrosis, unspecified: Secondary | ICD-10-CM | POA: Diagnosis present

## 2024-01-12 HISTORY — PX: CHONDROPLASTY: SHX5177

## 2024-01-12 HISTORY — PX: HARVEST BONE GRAFT: SHX377

## 2024-01-12 SURGERY — CHONDROPLASTY
Anesthesia: General | Site: Knee | Laterality: Right

## 2024-01-12 MED ORDER — HEPARIN SODIUM (PORCINE) 5000 UNIT/ML IJ SOLN
INTRAMUSCULAR | Status: AC
  Filled 2024-01-12: qty 1

## 2024-01-12 MED ORDER — TRANEXAMIC ACID-NACL 1000-0.7 MG/100ML-% IV SOLN
INTRAVENOUS | Status: AC
  Filled 2024-01-12: qty 100

## 2024-01-12 MED ORDER — PROPOFOL 10 MG/ML IV BOLUS
INTRAVENOUS | Status: DC | PRN
  Administered 2024-01-12: 200 mg via INTRAVENOUS

## 2024-01-12 MED ORDER — FENTANYL CITRATE (PF) 100 MCG/2ML IJ SOLN
INTRAMUSCULAR | Status: AC
  Filled 2024-01-12: qty 2

## 2024-01-12 MED ORDER — GABAPENTIN 300 MG PO CAPS
ORAL_CAPSULE | ORAL | Status: AC
  Filled 2024-01-12: qty 1

## 2024-01-12 MED ORDER — ONDANSETRON HCL 4 MG/2ML IJ SOLN
INTRAMUSCULAR | Status: AC
  Filled 2024-01-12: qty 2

## 2024-01-12 MED ORDER — FENTANYL CITRATE (PF) 100 MCG/2ML IJ SOLN
100.0000 ug | Freq: Once | INTRAMUSCULAR | Status: AC
  Administered 2024-01-12: 100 ug via INTRAVENOUS

## 2024-01-12 MED ORDER — ACETAMINOPHEN 500 MG PO TABS
ORAL_TABLET | ORAL | Status: AC
  Filled 2024-01-12: qty 2

## 2024-01-12 MED ORDER — HYDROMORPHONE HCL 1 MG/ML IJ SOLN
0.2500 mg | INTRAMUSCULAR | Status: DC | PRN
Start: 2024-01-12 — End: 2024-01-12
  Administered 2024-01-12: 0.5 mg via INTRAVENOUS

## 2024-01-12 MED ORDER — FENTANYL CITRATE (PF) 100 MCG/2ML IJ SOLN
INTRAMUSCULAR | Status: DC | PRN
  Administered 2024-01-12 (×2): 50 ug via INTRAVENOUS

## 2024-01-12 MED ORDER — SODIUM CHLORIDE 0.9 % IR SOLN
Status: DC | PRN
  Administered 2024-01-12: 3000 mL

## 2024-01-12 MED ORDER — LIDOCAINE 2% (20 MG/ML) 5 ML SYRINGE
INTRAMUSCULAR | Status: DC | PRN
  Administered 2024-01-12: 20 mg via INTRAVENOUS

## 2024-01-12 MED ORDER — MIDAZOLAM HCL 2 MG/2ML IJ SOLN
INTRAMUSCULAR | Status: AC
  Filled 2024-01-12: qty 2

## 2024-01-12 MED ORDER — PROPOFOL 10 MG/ML IV BOLUS
INTRAVENOUS | Status: AC
Start: 2024-01-12 — End: ?
  Filled 2024-01-12: qty 20

## 2024-01-12 MED ORDER — OXYCODONE HCL 5 MG PO TABS: 5.0000 mg | ORAL_TABLET | Freq: Once | ORAL | Status: DC | PRN

## 2024-01-12 MED ORDER — MIDAZOLAM HCL 2 MG/2ML IJ SOLN
2.0000 mg | Freq: Once | INTRAMUSCULAR | Status: AC
  Administered 2024-01-12: 2 mg via INTRAVENOUS

## 2024-01-12 MED ORDER — PHENYLEPHRINE 80 MCG/ML (10ML) SYRINGE FOR IV PUSH (FOR BLOOD PRESSURE SUPPORT)
PREFILLED_SYRINGE | INTRAVENOUS | Status: AC
  Filled 2024-01-12: qty 10

## 2024-01-12 MED ORDER — BUPIVACAINE HCL (PF) 0.25 % IJ SOLN
INTRAMUSCULAR | Status: AC
  Filled 2024-01-12: qty 60

## 2024-01-12 MED ORDER — PROPOFOL 10 MG/ML IV BOLUS
INTRAVENOUS | Status: AC
  Filled 2024-01-12: qty 20

## 2024-01-12 MED ORDER — PHENYLEPHRINE 80 MCG/ML (10ML) SYRINGE FOR IV PUSH (FOR BLOOD PRESSURE SUPPORT)
PREFILLED_SYRINGE | INTRAVENOUS | Status: DC | PRN
  Administered 2024-01-12: 80 ug via INTRAVENOUS
  Administered 2024-01-12 (×2): 160 ug via INTRAVENOUS
  Administered 2024-01-12 (×2): 80 ug via INTRAVENOUS

## 2024-01-12 MED ORDER — HYDROMORPHONE HCL 1 MG/ML IJ SOLN
INTRAMUSCULAR | Status: AC
  Filled 2024-01-12: qty 0.5

## 2024-01-12 MED ORDER — LIDOCAINE 2% (20 MG/ML) 5 ML SYRINGE
INTRAMUSCULAR | Status: AC
  Filled 2024-01-12: qty 5

## 2024-01-12 MED ORDER — VANCOMYCIN HCL 1000 MG IV SOLR
INTRAVENOUS | Status: AC
  Filled 2024-01-12: qty 20

## 2024-01-12 MED ORDER — MIDAZOLAM HCL 5 MG/5ML IJ SOLN
INTRAMUSCULAR | Status: DC | PRN
  Administered 2024-01-12: 2 mg via INTRAVENOUS

## 2024-01-12 MED ORDER — DEXAMETHASONE SODIUM PHOSPHATE 10 MG/ML IJ SOLN
INTRAMUSCULAR | Status: AC
  Filled 2024-01-12: qty 1

## 2024-01-12 MED ORDER — ACETAMINOPHEN 500 MG PO TABS
1000.0000 mg | ORAL_TABLET | Freq: Once | ORAL | Status: AC
  Administered 2024-01-12: 1000 mg via ORAL

## 2024-01-12 MED ORDER — SODIUM CHLORIDE (PF) 0.9 % IJ SOLN
INTRAMUSCULAR | Status: AC
  Filled 2024-01-12: qty 10

## 2024-01-12 MED ORDER — DROPERIDOL 2.5 MG/ML IJ SOLN
INTRAMUSCULAR | Status: DC | PRN
  Administered 2024-01-12: .625 mg via INTRAVENOUS

## 2024-01-12 MED ORDER — EPHEDRINE SULFATE-NACL 50-0.9 MG/10ML-% IV SOSY
PREFILLED_SYRINGE | INTRAVENOUS | Status: DC | PRN
  Administered 2024-01-12: 5 mg via INTRAVENOUS
  Administered 2024-01-12: 10 mg via INTRAVENOUS
  Administered 2024-01-12 (×2): 5 mg via INTRAVENOUS

## 2024-01-12 MED ORDER — MEPERIDINE HCL 25 MG/ML IJ SOLN: 6.2500 mg | INTRAMUSCULAR | Status: DC | PRN

## 2024-01-12 MED ORDER — AMISULPRIDE (ANTIEMETIC) 5 MG/2ML IV SOLN: 10.0000 mg | Freq: Once | INTRAVENOUS | Status: DC | PRN

## 2024-01-12 MED ORDER — DEXAMETHASONE SODIUM PHOSPHATE 10 MG/ML IJ SOLN
INTRAMUSCULAR | Status: DC | PRN
  Administered 2024-01-12: 10 mg via INTRAVENOUS

## 2024-01-12 MED ORDER — LACTATED RINGERS IV SOLN: INTRAVENOUS | Status: DC

## 2024-01-12 MED ORDER — DROPERIDOL 2.5 MG/ML IJ SOLN
INTRAMUSCULAR | Status: AC
  Filled 2024-01-12: qty 2

## 2024-01-12 MED ORDER — GABAPENTIN 300 MG PO CAPS: 300.0000 mg | ORAL_CAPSULE | Freq: Once | ORAL | Status: DC

## 2024-01-12 MED ORDER — OXYCODONE HCL 5 MG/5ML PO SOLN: 5.0000 mg | Freq: Once | ORAL | Status: DC | PRN

## 2024-01-12 MED ORDER — CEFAZOLIN SODIUM-DEXTROSE 2-4 GM/100ML-% IV SOLN
2.0000 g | INTRAVENOUS | Status: AC
  Administered 2024-01-12: 2 g via INTRAVENOUS

## 2024-01-12 MED ORDER — HEPARIN 5000 UNITS IN NS 1000 ML (FLUSH)
INTRAMUSCULAR | Status: DC | PRN
  Administered 2024-01-12: 1 mL via INTRAMUSCULAR

## 2024-01-12 MED ORDER — TRANEXAMIC ACID-NACL 1000-0.7 MG/100ML-% IV SOLN
1000.0000 mg | INTRAVENOUS | Status: DC
Start: 2024-01-12 — End: 2024-01-12

## 2024-01-12 MED ORDER — ROPIVACAINE HCL 5 MG/ML IJ SOLN
INTRAMUSCULAR | Status: DC | PRN
  Administered 2024-01-12: 20 mL via PERINEURAL

## 2024-01-12 MED ORDER — EPHEDRINE 5 MG/ML INJ
INTRAVENOUS | Status: AC
  Filled 2024-01-12: qty 5

## 2024-01-12 MED ORDER — CEFAZOLIN SODIUM-DEXTROSE 2-4 GM/100ML-% IV SOLN
INTRAVENOUS | Status: AC
  Filled 2024-01-12: qty 100

## 2024-01-12 MED ORDER — ONDANSETRON HCL 4 MG/2ML IJ SOLN
INTRAMUSCULAR | Status: DC | PRN
  Administered 2024-01-12: 4 mg via INTRAVENOUS

## 2024-01-12 SURGICAL SUPPLY — 54 items
BANDAGE ESMARK 6X9 LF (GAUZE/BANDAGES/DRESSINGS) IMPLANT
BLADE EXCALIBUR 4.0X13 (MISCELLANEOUS) IMPLANT
BLADE SURG 15 STRL LF DISP TIS (BLADE) ×2 IMPLANT
BNDG ELASTIC 4INX 5YD STR LF (GAUZE/BANDAGES/DRESSINGS) IMPLANT
BNDG ELASTIC 6INX 5YD STR LF (GAUZE/BANDAGES/DRESSINGS) ×2 IMPLANT
BNDG ESMARK 6X9 LF (GAUZE/BANDAGES/DRESSINGS) IMPLANT
CHLORAPREP W/TINT 26 (MISCELLANEOUS) ×2 IMPLANT
COOLER ICEMAN CLASSIC (MISCELLANEOUS) IMPLANT
COVER MAYO STAND STRL (DRAPES) ×2 IMPLANT
DISSECTOR 3.8MM X 13CM (MISCELLANEOUS) ×2 IMPLANT
DRAPE C-ARM 42X72 X-RAY (DRAPES) ×2 IMPLANT
DRAPE C-ARMOR (DRAPES) ×2 IMPLANT
DRAPE IMP U-DRAPE 54X76 (DRAPES) IMPLANT
DRAPE INCISE IOBAN 66X45 STRL (DRAPES) IMPLANT
DRAPE LAPAROTOMY 100X72 PEDS (DRAPES) ×2 IMPLANT
DRAPE SURG 17X23 STRL (DRAPES) ×8 IMPLANT
DRAPE U-SHAPE 47X51 STRL (DRAPES) IMPLANT
DRAPE-T ARTHROSCOPY W/POUCH (DRAPES) ×2 IMPLANT
DRESSING MEPILEX FLEX 4X4 (GAUZE/BANDAGES/DRESSINGS) IMPLANT
DRSG MEPILEX FLEX 4X4 (GAUZE/BANDAGES/DRESSINGS) ×2 IMPLANT
DRSG TEGADERM 4X4.75 (GAUZE/BANDAGES/DRESSINGS) IMPLANT
DW OUTFLOW CASSETTE/TUBE SET (MISCELLANEOUS) IMPLANT
ELECT REM PT RETURN 9FT ADLT (ELECTROSURGICAL) IMPLANT
ELECTRODE REM PT RTRN 9FT ADLT (ELECTROSURGICAL) IMPLANT
EXCALIBUR 3.8MM X 13CM (MISCELLANEOUS) IMPLANT
GAUZE 4X4 16PLY ~~LOC~~+RFID DBL (SPONGE) IMPLANT
GAUZE PAD ABD 8X10 STRL (GAUZE/BANDAGES/DRESSINGS) IMPLANT
GAUZE SPONGE 4X4 12PLY STRL (GAUZE/BANDAGES/DRESSINGS) ×2 IMPLANT
GAUZE XEROFORM 1X8 LF (GAUZE/BANDAGES/DRESSINGS) ×2 IMPLANT
GLOVE BIO SURGEON STRL SZ 6 (GLOVE) ×2 IMPLANT
GLOVE BIO SURGEON STRL SZ7.5 (GLOVE) ×2 IMPLANT
GLOVE BIOGEL PI IND STRL 6.5 (GLOVE) ×2 IMPLANT
GLOVE BIOGEL PI IND STRL 8 (GLOVE) ×2 IMPLANT
GOWN STRL REUS W/ TWL LRG LVL3 (GOWN DISPOSABLE) ×2 IMPLANT
GOWN STRL REUS W/ TWL XL LVL3 (GOWN DISPOSABLE) ×2 IMPLANT
KIT BONE MRW ASP ANGEL CPRP (KITS) IMPLANT
KIT IOBP KNEE OPEN TIP (KITS) IMPLANT
MANIFOLD NEPTUNE II (INSTRUMENTS) ×2 IMPLANT
PACK ARTHROSCOPY DSU (CUSTOM PROCEDURE TRAY) ×2 IMPLANT
PACK BASIN DAY SURGERY FS (CUSTOM PROCEDURE TRAY) ×2 IMPLANT
PAD COLD SHLDR WRAP-ON (PAD) ×2 IMPLANT
PADDING CAST COTTON 6X4 STRL (CAST SUPPLIES) IMPLANT
PENCIL SMOKE EVACUATOR (MISCELLANEOUS) IMPLANT
PICK POWER XL 45DEG (MISCELLANEOUS) IMPLANT
PUTTY DBM ALLOSYNC PURE 10CC (Putty) IMPLANT
SLEEVE SCD COMPRESS KNEE MED (STOCKING) ×2 IMPLANT
SPONGE T-LAP 18X18 ~~LOC~~+RFID (SPONGE) ×2 IMPLANT
SUT ETHILON 3 0 PS 1 (SUTURE) ×2 IMPLANT
SUT VIC AB 0 CT1 27XBRD ANBCTR (SUTURE) IMPLANT
SUT VIC AB 2-0 CT1 TAPERPNT 27 (SUTURE) IMPLANT
TOWEL GREEN STERILE FF (TOWEL DISPOSABLE) ×4 IMPLANT
TUBING ARTHROSCOPY IRRIG 16FT (MISCELLANEOUS) ×2 IMPLANT
WAND ABLATOR APOLLO I90 (BUR) ×2 IMPLANT
YANKAUER SUCT BULB TIP NO VENT (SUCTIONS) ×2 IMPLANT

## 2024-01-12 NOTE — Anesthesia Postprocedure Evaluation (Signed)
 Anesthesia Post Note  Patient: Diane [name]  Procedure(s) Performed: RIGHT KNEE ARTHROSCOPY LATERAL FEMORAL CHONDROPLASTY (Right: Knee) RIGHT BONE MARROW ASPIRATE FROM ILIAC CREST DECOMPRESSION AND PLACEMENT RIGHT LATERAL FEMORAL CONDYLE (Right: Hip)     Patient location during evaluation: PACU Anesthesia Type: General Level of consciousness: awake and alert Pain management: pain level controlled Vital Signs Assessment: post-procedure vital signs reviewed and stable Respiratory status: spontaneous breathing, nonlabored ventilation and respiratory function stable Cardiovascular status: blood pressure returned to baseline and stable Postop Assessment: no apparent nausea or vomiting Anesthetic complications: no   No notable events documented.  Last Vitals:  Vitals:   01/12/24 1000 01/12/24 1015  BP: 136/77 138/83  Pulse: 81 81  Resp: 14 13  Temp:    SpO2: 100% 93%    Last Pain:  Vitals:   01/12/24 1000  TempSrc:   PainSc: 3                  Lowella Curb

## 2024-01-12 NOTE — Interval H&P Note (Signed)
 History and Physical Interval Note:  01/12/2024 7:03 AM  Diane [name]  has presented today for surgery, with the diagnosis of RIGHT DISTAL FEMUR AVASCULAR NECROSIS.  The various methods of treatment have been discussed with the patient and family. After consideration of risks, benefits and other options for treatment, the patient has consented to  Procedure(s): RIGHT KNEE ARTHROSCOPY LATERAL FEMORAL CHONDROPLASTY (Right) RIGHT BONE MARROW ASPIRATE FROM ILIAC CREST DECOMPRESSION AND PLACEMENT RIGHT LATERAL FEMORAL CONDYLE (Right) as a surgical intervention.  The patient's history has been reviewed, patient examined, no change in status, stable for surgery.  I have reviewed the patient's chart and labs.  Questions were answered to the patient's satisfaction.     [name]

## 2024-01-12 NOTE — Anesthesia Procedure Notes (Signed)
 Procedure Name: LMA Insertion Date/Time: 01/12/2024 7:37 AM  Performed by: Roosvelt Harps, CRNAPre-anesthesia Checklist: Patient identified, Emergency Drugs available, Suction available and Patient being monitored Patient Re-evaluated:Patient Re-evaluated prior to induction Oxygen Delivery Method: Circle System Utilized Preoxygenation: Pre-oxygenation with 100% oxygen Induction Type: IV induction Ventilation: Mask ventilation without difficulty LMA: LMA inserted LMA Size: 4.0 Number of attempts: 1 Airway Equipment and Method: Bite block Placement Confirmation: positive ETCO2 and breath sounds checked- equal and bilateral Tube secured with: Tape Dental Injury: Teeth and Oropharynx as per pre-operative assessment

## 2024-01-12 NOTE — Discharge Instructions (Addendum)
 Discharge Instructions    Attending Surgeon: [name], MD Office Phone Number: [phone]   Diagnosis and Procedures:    Surgeries Performed: Right knee AVN drilling, lateral femoral microfracture  Discharge Plan:    Diet: Resume usual diet. Begin with light or bland foods.  Drink plenty of fluids.  Activity:  Weight bearing as tolerated with on right leg.. You are advised to go home directly from the hospital or surgical center. Restrict your activities.  GENERAL INSTRUCTIONS: 1.  Please apply ice to your wound to help with swelling and inflammation. This will improve your comfort and your overall recovery following surgery.     2. Please call Dr. Serena Croissant office at [phone] with questions Monday-Friday during business hours. If no one answers, please leave a message and someone should get back to the patient within 24 hours. For emergencies please call 911 or proceed to the emergency room.   3. Patient to notify surgical team if experiences any of the following: Bowel/Bladder dysfunction, uncontrolled pain, nerve/muscle weakness, incision with increased drainage or redness, nausea/vomiting and Fever greater than 101.0 F.  Be alert for signs of infection including redness, streaking, odor, fever or chills. Be alert for excessive pain or bleeding and notify your surgeon immediately.  WOUND INSTRUCTIONS:   Leave your dressing, cast, or splint in place until your post operative visit.  Keep it clean and dry.  Always keep the incision clean and dry until the staples/sutures are removed. If there is no drainage from the incision you should keep it open to air. If there is drainage from the incision you must keep it covered at all times until the drainage stops  Do not soak in a bath tub, hot tub, pool, lake or other body of water until 21 days after your surgery and your incision is completely dry and healed.  If you have removable sutures (or staples) they must be  removed 10-14 days (unless otherwise instructed) from the day of your surgery.     1)  Elevate the extremity as much as possible.  2)  Keep the dressing clean and dry.  3)  Please call us if the dressing becomes wet or dirty.  4)  If you are experiencing worsening pain or worsening swelling, please call.     MEDICATIONS: Resume all previous home medications at the previous prescribed dose and frequency unless otherwise noted Start taking the  pain medications on an as-needed basis as prescribed  Please taper down pain medication over the next week following surgery.  Ideally you should not require a refill of any narcotic pain medication.  Take pain medication with food to minimize nausea. In addition to the prescribed pain medication, you may take over-the-counter pain relievers such as Tylenol.  Do NOT take additional tylenol if your pain medication already has tylenol in it.  Aspirin 325mg  daily per instructions on bottle. Narcotic policy: Per [facility] clinic policy, our goal is ensure optimal postoperative pain control with a multimodal pain management strategy. For all [facility] patients, our goal is to wean post-operative narcotic medications by 6 weeks post-operatively, and many times sooner. If this is not possible due to utilization of pain medication prior to surgery, your [facility] doctor will support your acute post-operative pain control for the first 6 weeks postoperatively, with a plan to transition you back to your primary pain team following that. Cyndia Skeeters will work to ensure a Therapist, occupational.       FOLLOWUP INSTRUCTIONS: 1. Follow  up at the Physical Therapy Clinic 3-4 days following surgery. This appointment should be scheduled unless other arrangements have been made.The Physical Therapy scheduling number is [phone] if an appointment has not already been arranged.  2. Contact Dr. Serena Croissant office during office hours at [phone] or the practice after hours line  at [phone] for non-emergencies. For medical emergencies call 911.   Discharge Location: Home   No Tylenol before 1pm today.   Post Anesthesia Home Care Instructions  Activity: Get plenty of rest for the remainder of the day. A responsible individual must stay with you for 24 hours following the procedure.  For the next 24 hours, DO NOT: -Drive a car -Advertising copywriter -Drink alcoholic beverages -Take any medication unless instructed by your physician -Make any legal decisions or sign important papers.  Meals: Start with liquid foods such as gelatin or soup. Progress to regular foods as tolerated. Avoid greasy, spicy, heavy foods. If nausea and/or vomiting occur, drink only clear liquids until the nausea and/or vomiting subsides. Call your physician if vomiting continues.  Special Instructions/Symptoms: Your throat may feel dry or sore from the anesthesia or the breathing tube placed in your throat during surgery. If this causes discomfort, gargle with warm salt water. The discomfort should disappear within 24 hours.  If you had a scopolamine patch placed behind your ear for the management of post- operative nausea and/or vomiting:  1. The medication in the patch is effective for 72 hours, after which it should be removed.  Wrap patch in a tissue and discard in the trash. Wash hands thoroughly with soap and water. 2. You may remove the patch earlier than 72 hours if you experience unpleasant side effects which may include dry mouth, dizziness or visual disturbances. 3. Avoid touching the patch. Wash your hands with soap and water after contact with the patch.    Regional Anesthesia Blocks  1. You may not be able to move or feel the "blocked" extremity after a regional anesthetic block. This may last may last from 3-48 hours after placement, but it will go away. The length of time depends on the medication injected and your individual response to the medication. As the nerves  start to wake up, you may experience tingling as the movement and feeling returns to your extremity. If the numbness and inability to move your extremity has not gone away after 48 hours, please call your surgeon.   2. The extremity that is blocked will need to be protected until the numbness is gone and the strength has returned. Because you cannot feel it, you will need to take extra care to avoid injury. Because it may be weak, you may have difficulty moving it or using it. You may not know what position it is in without looking at it while the block is in effect.  3. For blocks in the legs and feet, returning to weight bearing and walking needs to be done carefully. You will need to wait until the numbness is entirely gone and the strength has returned. You should be able to move your leg and foot normally before you try and bear weight or walk. You will need someone to be with you when you first try to ensure you do not fall and possibly risk injury.  4. Bruising and tenderness at the needle site are common side effects and will resolve in a few days.  5. Persistent numbness or new problems with movement should be communicated to the surgeon or  the [facility] [phone] [facility] [phone]).

## 2024-01-12 NOTE — Brief Op Note (Signed)
   Brief Op Note  Date of Surgery: 01/12/2024  Preoperative Diagnosis: RIGHT DISTAL FEMUR AVASCULAR NECROSIS  Postoperative Diagnosis: same  Procedure: Procedure(s): RIGHT KNEE ARTHROSCOPY LATERAL FEMORAL CHONDROPLASTY RIGHT BONE MARROW ASPIRATE FROM ILIAC CREST DECOMPRESSION AND PLACEMENT RIGHT LATERAL FEMORAL CONDYLE  Implants: Implant Name Type Inv. Item Serial No. Manufacturer Lot No. LRB No. Used Action  PUTTY DBM ALLOSYNC PURE 10CC - [id number] Putty Marjean [facility] PURE 10CC [id number] [employer] [id number] Right 1 Implanted    Surgeons: Surgeon(s): [name], MD  Anesthesia: General    Estimated Blood Loss: See anesthesia record  Complications: None  Condition to PACU: Stable  Benancio Deeds, MD 01/12/2024 9:18 AM

## 2024-01-12 NOTE — Op Note (Signed)
 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  The risk and benefits of the procedure were discussed in detail and documented in the pre-operative evaluation.   PREOPERATIVE DIAGNOSIS: 1.  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 2.  Right knee chondral lesion 1.5 x 2 cm  PROCEDURE: 1.  Right knee arthroscopy with lateral femoral condyle chondroplasty 2.  Sequestrectomy right knee lateral femoral condyle 3.  Right iliac crest bone marrow aspiration  SURGEON: Benancio Deeds MD  ASSISTANT: Ardeen Fillers, ATC  ANESTHESIA:  general  IV FLUIDS AND URINE: See anesthesia record.  ANTIBIOTICS: Ancef  ESTIMATED BLOOD LOSS: 10 mL.  IMPLANTS:  Implant Name Type Inv. Item Serial No. Manufacturer Lot No. LRB No. Used Action  PUTTY DBM ALLOSYNC PURE 10CC - [id number] Putty PUTTY DBM [employer]OSYNC PURE 10CC [id number] [employer] [id number] Right 1 Implanted    DRAINS: None  CULTURES: None  COMPLICATIONS: none  DESCRIPTION OF PROCEDURE:  Examination under anesthesia: A careful examination under anesthesia was performed.  Knee ROM motion was: -3-135 Lachman: Normal Pivot Shift: Normal Posterior drawer: normal.   Varus stability in full extension: normal.   Varus stability in 30 degrees of flexion: normal.  Valgus stability in full extension: normal.   Valgus stability in 30 degrees of flexion: normal.  Posterolateral drawer: normal   Intra-operative findings: A thorough arthroscopic examination of the knee was performed.  The findings are: 1. Suprapatellar pouch: Normal 2. Undersurface of median ridge: Normal 3. Medial patellar facet: Normal 4. Lateral patellar facet: Normal 5. Trochlea: Grade 2 linear chondral loss 6. Lateral gutter/popliteus tendon: Normal 7. Hoffa's fat pad: Normal 8. Medial gutter/plica: Normal 9. ACL:  Normal 10. PCL: Normal 11. Medial meniscus: Normal 12. Medial compartment cartilage: Normal 13. Lateral meniscus: Normal 14. Lateral compartment cartilage: 1.5 by 2 cm lateral femoral condyle lesion  I identified the patient in the pre-operative holding area.  I marked the operative knee with my initials. I reviewed the risks and benefits of the proposed surgical intervention and the patient wished to proceed.  Anesthesia performed a peripheral nerve block.  Patient was subsequently taken back to the operating room.  The patient was transferred to the operative suite and placed in the supine position with all bony prominences padded.     SCDs were placed on the non-operative lower extremity. Appropriate antibiotics was administered within 1 hour before incision. The operative lower extremity was then prepped and draped in standard fashion. A time out was performed confirming the correct extremity, correct patient and correct procedure.   I began with the right iliac crest aspiration.  15 blade was used to incise over the iliac crest.  Hemostat was used to spread to bone.  The trocar was introduced and bone marrow aspirate was removed.  A total of 20 cc of bone marrow aspirate was obtained and this was supplemented with 25 cc of whole blood.  This was spun down into the [employer].    A standard anterolateral portal was made with an 11 blade.  The ideal position for the anteromedial portal was established using a spinal needle.  This AM portal was then created under direct visualization with an 11 blade.  A full diagnostic arthroscopy was then performed, as described above, including probing of the chondral and meniscal surfaces.     The lesion of the cartilage was identified in the lateral femoral  condyle.  The shaver was introduced and this was debrided back to a peripheral healthy rim.  At this point the power pick was attached to the shaver and sequential drilling was performed approximately 2  mm spaced apart.  This had excellent bleeding into the bed of the chondral defect.  At this time the open portion of the case began.  15 blade was used to incise laterally through the IT band.  Hemostat was used to spread down to bone.  The site of a vast necrosis was localized using the trocar under direct fluoroscopic guidance.  This was placed according to MRI location of the defect, this was open drilled and reamed with the 7 mm flexible reamer.  Once the defect was expanded the bone marrow aspirate concentrate was injected into this defect.  The trocar was removed.  That concluded the case.  Skin was closed with 2-0 Vicryl and 3-0 nylon. Xeroform gauze, gauze, Tegaderm, Iceman and brace were applied.  Instrument, sponge, and needle counts were correct prior to wound closure and at the conclusion of the case.  The patient was taken to the PACU without complication  POSTOPERATIVE PLAN: Weightbearing as tolerated on the right lower extremity.  She was placed on aspirin for blood clot prevention.  She will begin physical therapy immediately postop  Benancio Deeds, MD 9:19 AM

## 2024-01-12 NOTE — Transfer of Care (Signed)
 Immediate Anesthesia Transfer of Care Note  Patient: Diane [name]  Procedure(s) Performed: RIGHT KNEE ARTHROSCOPY LATERAL FEMORAL CHONDROPLASTY (Right: Knee) RIGHT BONE MARROW ASPIRATE FROM ILIAC CREST DECOMPRESSION AND PLACEMENT RIGHT LATERAL FEMORAL CONDYLE (Right: Hip)  Patient Location: PACU  Anesthesia Type:GA combined with regional for post-op pain  Level of Consciousness: drowsy and patient cooperative  Airway & Oxygen Therapy: Patient Spontanous Breathing and Patient connected to face mask oxygen  Post-op Assessment: Report given to RN and Post -op Vital signs reviewed and stable  Post vital signs: Reviewed and stable  Last Vitals:  Vitals Value Taken Time  BP    Temp    Pulse    Resp    SpO2      Last Pain:  Vitals:   01/12/24 0649  TempSrc: Temporal  PainSc:          Complications: No notable events documented.

## 2024-01-12 NOTE — Progress Notes (Signed)
Assisted Dr. Miller with right, adductor canal, ultrasound guided block. Side rails up, monitors on throughout procedure. See vital signs in flow sheet. Tolerated Procedure well. ? ?

## 2024-01-12 NOTE — Anesthesia Preprocedure Evaluation (Signed)
 Anesthesia Evaluation  Patient identified by MRN, date of birth, ID band Patient awake    Reviewed: Allergy & Precautions, NPO status , Patient's Chart, lab work & pertinent test results  History of Anesthesia Complications (+) PONV and history of anesthetic complications  Airway Mallampati: II  TM Distance: >3 FB Neck ROM: Full    Dental no notable dental hx. (+) Dental Advisory Given, Partial Upper, Poor Dentition,    Pulmonary sleep apnea , former smoker   Pulmonary exam normal breath sounds clear to auscultation       Cardiovascular hypertension, Pt. on medications Normal cardiovascular exam Rhythm:Regular Rate:Normal     Neuro/Psych  Headaches  Anxiety Depression       GI/Hepatic PUD,GERD  ,,  Endo/Other    Renal/GU      Musculoskeletal  (+) Arthritis , Osteoarthritis,    Abdominal  (+) + obese  Peds  Hematology   Anesthesia Other Findings   Reproductive/Obstetrics                             Lab Results  Component Value Date   WBC 4.8 06/20/2023   HGB 11.6 (L) 06/20/2023   HCT 35.3 (L) 06/20/2023   MCV 84.0 06/20/2023   PLT 263 06/20/2023    Anesthesia Physical Anesthesia Plan  ASA: II  Anesthesia Plan: General   Post-op Pain Management: Regional block*   Induction: Intravenous  PONV Risk Score and Plan: 4 or greater and Treatment may vary due to age or medical condition, Ondansetron, Dexamethasone, Midazolam and Droperidol  Airway Management Planned: LMA  Additional Equipment:   Intra-op Plan:   Post-operative Plan: Extubation in OR  Informed Consent: I have reviewed the patients History and Physical, chart, labs and discussed the procedure including the risks, benefits and alternatives for the proposed anesthesia with the patient or authorized representative who has indicated his/her understanding and acceptance.     Dental advisory given  Plan Discussed  with: CRNA  Anesthesia Plan Comments:         Anesthesia Quick Evaluation

## 2024-01-12 NOTE — Anesthesia Procedure Notes (Signed)
 Anesthesia Regional Block: Adductor canal block   Pre-Anesthetic Checklist: , timeout performed,  Correct Patient, Correct Site, Correct Laterality,  Correct Procedure, Correct Position, site marked,  Risks and benefits discussed,  Surgical consent,  Pre-op evaluation,  At surgeon's request and post-op pain management  Laterality: Right  Prep: chloraprep       Needles:  Injection technique: Single-shot  Needle Type: Stimiplex     Needle Length: 9cm  Needle Gauge: 21     Additional Needles:   Procedures:,,,, ultrasound used (permanent image in chart),,    Narrative:  Start time: 01/12/2024 7:08 AM End time: 01/12/2024 7:13 AM Injection made incrementally with aspirations every 5 mL.  Performed by: Personally  Anesthesiologist: Lowella Curb, MD

## 2024-01-13 ENCOUNTER — Encounter (HOSPITAL_BASED_OUTPATIENT_CLINIC_OR_DEPARTMENT_OTHER): Payer: Self-pay | Admitting: Orthopaedic Surgery

## 2024-01-13 ENCOUNTER — Ambulatory Visit (HOSPITAL_BASED_OUTPATIENT_CLINIC_OR_DEPARTMENT_OTHER): Payer: BC Managed Care – PPO | Admitting: Orthopaedic Surgery

## 2024-01-14 ENCOUNTER — Ambulatory Visit: Payer: BC Managed Care – PPO | Admitting: Physical Therapy

## 2024-01-18 ENCOUNTER — Encounter (HOSPITAL_BASED_OUTPATIENT_CLINIC_OR_DEPARTMENT_OTHER): Payer: Self-pay | Admitting: Orthopaedic Surgery

## 2024-01-18 ENCOUNTER — Encounter (HOSPITAL_BASED_OUTPATIENT_CLINIC_OR_DEPARTMENT_OTHER): Payer: Self-pay | Admitting: Student

## 2024-01-18 ENCOUNTER — Ambulatory Visit (HOSPITAL_BASED_OUTPATIENT_CLINIC_OR_DEPARTMENT_OTHER): Admitting: Student

## 2024-01-18 ENCOUNTER — Encounter (HOSPITAL_BASED_OUTPATIENT_CLINIC_OR_DEPARTMENT_OTHER): Payer: Self-pay

## 2024-01-18 DIAGNOSIS — M8700 Idiopathic aseptic necrosis of unspecified bone: Secondary | ICD-10-CM

## 2024-01-18 NOTE — Progress Notes (Signed)
 Post Operative Evaluation    Procedure/Date of Surgery: Right knee arthroscopy with lateral femoral condyle chondroplasty, right iliac crest bone marrow aspiration 01/12/2024  Interval History:   Patient presents 6 days status post the above procedure.  She does report some numbness and cold sensation in the right foot and toes that began about 2 days ago.  Has been having mild pain in the low back and is scheduled next week for follow-up on recent lumbar MRI.  Has been taking Tylenol for pain which she rates at a 7/10 today.  Also feels like the bandage on the anterior right hip is bothering her skin.  Denies any fever, chills, or notable incision drainage.   PMH/PSH/Family History/Social History/Meds/Allergies:    Past Medical History:  Diagnosis Date   ADD (attention deficit disorder with hyperactivity)    Allergy    Anxiety    Arthritis    Atypical chest pain 07/26/2021   Atypical chest pain     Benign intracranial hypertension 06/16/2008   Qualifier: Diagnosis of   By: Zimonjic, Milica         Choking    due to food and pills get stuck   Cholelithiasis and acute cholecystitis without obstruction 12/02/2016   Chronic cough 12/27/2013   Depression    Dry mouth 08/12/2017   Dysmenorrhea    Family history of adverse reaction to anesthesia    pt's father has hx. of being hard to wake up post-op   Gastric ulcer 03/16/2015   GERD (gastroesophageal reflux disease)    Headache 07/09/2007   Qualifier: Diagnosis of   By: Cato Mulligan MD, Bruce      Replacing diagnoses that were inactivated after the 02/16/23 regulatory import     History of gastric ulcer    History of kidney stones    Hyperlipidemia    IBS (irritable bowel syndrome)    Lower extremity edema 07/26/2021   Lower extremity edema     Migraines    neurologist-  dr c. Sharene Skeans ([facility] in [place])-- treated with Emgality injection every 30 days   Mild obstructive sleep apnea     per study in epic 03-21-2017 mild osa, recommendation cpap, mouth appliance, wt loss   Nausea and vomiting 12/03/2016   OAB (overactive bladder) 08/12/2017   Osteoarthritis    right ankle   Osteochondral lesion 09/2016   right ankle   Osteomyelitis of knee region [facility])    Peripheral vascular disease (HCC)    NERVE DAMAGE RIGHT LEG AND FOOT DUE TO INJURGY.    Plantar fasciitis of left foot    Pleural effusion 12/15/2013   PONV (postoperative nausea and vomiting)    AND HEADACHES   Pseudotumor cerebri    at back head   RUQ pain    S/P dilatation of esophageal stricture 07/2018   Sinusitis 08/12/2018   RESOLVED WITH ANTIOBIOTIC   SUI (stress urinary incontinence, female)    Wears contact lenses    Wears partial dentures    upper   Past Surgical History:  Procedure Laterality Date   ANKLE ARTHROSCOPY Right 10/22/2016   Procedure: ANKLE ARTHROSCOPY;  Surgeon: Vivi Barrack, DPM;  Location: [facility];  Service: Podiatry;  Laterality: Right;  GENERAL/REG BLOCK   ANKLE ARTHROSCOPY Right 09-15-2017   @[facility]    BLADDER SURGERY  child   x 2 - as a child to stretch bladder   CESAREAN SECTION  12/24/2009   CESAREAN SECTION  11/20/2011   Procedure: CESAREAN SECTION;  Surgeon: Lenoard Aden, MD;  Location: [facility];  Service: Gynecology;  Laterality: N/A;   CHOLECYSTECTOMY N/A 12/03/2016   Procedure: LAPAROSCOPIC CHOLECYSTECTOMY WITH INTRAOPERATIVE CHOLANGIOGRAM;  Surgeon: Darnell Level, MD;  Location: [facility];  Service: General;  Laterality: N/A;   CHONDROPLASTY Right 01/12/2024   Procedure: RIGHT KNEE ARTHROSCOPY LATERAL FEMORAL CHONDROPLASTY;  Surgeon: [name], MD;  Location: [facility];  Service: Orthopedics;  Laterality: Right;   CYSTOSCOPY WITH RETROGRADE PYELOGRAM, URETEROSCOPY AND STENT PLACEMENT Left 08/10/2006   ESOPHAGOGASTRODUODENOSCOPY (EGD) WITH ESOPHAGEAL DILATION  07/2018   HARDWARE REMOVAL Right 07/01/2016   Procedure: HARDWARE REMOVAL;   Surgeon: Cammy Copa, MD;  Location: [facility];  Service: Orthopedics;  Laterality: Right;   HARVEST BONE GRAFT Right 01/12/2024   Procedure: RIGHT BONE MARROW ASPIRATE FROM ILIAC CREST DECOMPRESSION AND PLACEMENT RIGHT LATERAL FEMORAL CONDYLE;  Surgeon: [name], MD;  Location: [facility];  Service: Orthopedics;  Laterality: Right;   KNEE ARTHROSCOPY WITH LATERAL RELEASE Right 12/13/2014   Procedure: KNEE ARTHROSCOPY WITH LATERAL RELEASE;  Surgeon: Thera Flake., MD;  Location: [facility];  Service: Orthopedics;  Laterality: Right;   LAPAROSCOPIC APPENDECTOMY N/A 06/11/2014   Procedure: APPENDECTOMY LAPAROSCOPIC;  Surgeon: Velora Heckler, MD;  Location: [facility];  Service: General;  Laterality: N/A;   ORIF ANKLE FRACTURE Right 01/05/2016   Procedure: OPEN REDUCTION INTERNAL FIXATION (ORIF) ANKLE FRACTURE;  Surgeon: Cammy Copa, MD;  Location: [facility];  Service: Orthopedics;  Laterality: Right;   OVARIAN CYST SURGERY Right 03/05/2007   AND CHROMOPERTUBATION  VIA LAPAROSCOPY   ROBOTIC ASSISTED LAPAROSCOPIC HYSTERECTOMY AND SALPINGECTOMY Bilateral 08/27/2018   Procedure: XI ROBOTIC ASSISTED LAPAROSCOPIC HYSTERECTOMY AND BILATERAL SALPINGECTOMY;  Surgeon: Olivia Mackie, MD;  Location: [facility];  Service: Gynecology;  Laterality: Bilateral;  [facility] for 23hr OBS   SYNOVECTOMY Right 12/13/2014   Procedure: PLICA SYNOVECTOMY;  Surgeon: Thera Flake., MD;  Location: [facility];  Service: Orthopedics;  Laterality: Right;   TONSILLECTOMY AND ADENOIDECTOMY  child   WISDOM TOOTH EXTRACTION     Social History   Socioeconomic History   Marital status: Married    Spouse name: Not on file   Number of children: 2   Years of education: Not on file   Highest education level: Not on file  Occupational History   Occupation: Investment banker, 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  Tobacco Use   Smoking status: Former    Current packs/day: 0.50     Average packs/day: 0.5 packs/day for 15.0 years (7.5 ttl pk-yrs)    Types: Cigarettes   Smokeless tobacco: Never  Vaping Use   Vaping status: Every Day   Substances: Nicotine  Substance and Sexual Activity   Alcohol use: Yes    Comment: occasional   Drug use: No   Sexual activity: Yes    Birth control/protection: Surgical    Comment: partial hysterectomy  Other Topics Concern   Not on file  Social History Narrative   Not on file   Social Drivers of Health   Financial Resource Strain: Not on file  Food Insecurity: Not on file  Transportation Needs: Not on file  Physical Activity: Not on file  Stress: Not on file  Social Connections: Unknown (03/26/2022)  Received from [employer], [facility]   Social Network    Social Network: Not on file   Family History  Problem Relation Age of Onset   Learning disabilities Mother    Asthma Mother    Arthritis Mother    Irritable bowel syndrome Mother    Miscarriages / Stillbirths Mother    Hypertension Father    Early death Father    Drug abuse Father    Depression Father    Cancer Father    Arthritis Father    Alcohol abuse Father    Brain cancer Father    Anesthesia problems Father        hard to wake up post-op   Lung cancer Father    Hyperlipidemia Maternal Grandmother    Hypertension Maternal Grandmother    Cancer Maternal Grandmother    Asthma Maternal Grandmother    Arthritis Maternal Grandmother    Breast cancer Maternal Grandmother    Ovarian cancer Maternal Grandmother    Diabetes Maternal Grandmother    Heart disease Maternal Grandmother    Heart attack Maternal Grandmother    Hypertension Maternal Grandfather    Hyperlipidemia Maternal Grandfather    Arthritis Maternal Grandfather    Alcohol abuse Maternal Grandfather    Cancer Maternal Grandfather    Prostate cancer Maternal Grandfather    Melanoma Maternal Grandfather    Cancer - Prostate Maternal Grandfather    Hypertension Paternal Grandmother     COPD Paternal Grandmother    Arthritis Paternal Grandmother    Learning disabilities Daughter    Learning disabilities Son    Colon cancer Neg Hx    Allergies  Allergen Reactions   Nickel Rash    "any jewelry that's not real"/ rash and itching    Prochlorperazine Other (See Comments)   Nsaids Other (See Comments)    DUE TO HISTORY OF GASTRIC ULCER   Tolmetin Itching and Other (See Comments)    DUE TO HISTORY OF GASTRIC ULCER  DUE TO HISTORY OF GASTRIC ULCER  DUE TO HISTORY OF GASTRIC ULCER, DUE TO HISTORY OF GASTRIC ULCER   Oxybutynin Other (See Comments)    "severe dry mouth"   Adhesive [Tape] Rash   Metoclopramide Hcl Other (See Comments)    RESTLESSNESS/JITTERY   Prochlorperazine Edisylate Other (See Comments)    RESTLESSNESS/JITTERY    Sulfa Antibiotics Rash   Current Outpatient Medications  Medication Sig Dispense Refill   ADDERALL XR 20 MG 24 hr capsule Take 20 mg by mouth every morning.  0   albuterol (PROVENTIL HFA;VENTOLIN HFA) 108 (90 Base) MCG/ACT inhaler Inhale 2 puffs into the lungs every 6 (six) hours as needed for wheezing or shortness of breath.      ALPRAZolam (XANAX) 0.25 MG tablet SMARTSIG:0.5 Tablet(s) By Mouth PRN     aspirin EC 81 MG tablet Take 1 tablet (81 mg total) by mouth daily. Swallow whole. 14 tablet 0   aspirin EC 81 MG tablet Take 1 tablet (81 mg total) by mouth daily. Swallow whole. 30 tablet 12   atorvastatin (LIPITOR) 40 MG tablet Take 1 tablet (40 mg total) by mouth daily. 15 tablet 0   Bacillus Coagulans-Inulin (PROBIOTIC) 1-250 BILLION-MG CAPS      cetirizine (ZYRTEC) 10 MG tablet Take by mouth.     EMGALITY 120 MG/ML SOAJ Inject 120 mg into the skin every 30 (thirty) days.  4   Fezolinetant 45 MG TABS Take 1 tablet (45 mg total) by mouth daily. Can be taken with or  without food. (Patient not taking: Reported on 01/11/2024) 90 tablet 3   fluticasone (FLONASE) 50 MCG/ACT nasal spray Place into the nose.     fluticasone (FLONASE) 50  MCG/ACT nasal spray Place 2 sprays into both nostrils daily. 16 g 6   gabapentin (NEURONTIN) 300 MG capsule TAKE 2 CAPSULES BY MOUTH 3 TIMES DAILY (Patient taking differently: Take 600-1,200 mg by mouth See admin instructions. Take 600mg  by mouth in the morning and 1,200mg  in the evening) 180 capsule 4   LINZESS 290 MCG CAPS capsule Take 290 mcg by mouth daily. (Patient not taking: Reported on 01/11/2024)     loratadine (CLARITIN) 10 MG tablet Take 1 tablet (10 mg total) by mouth daily. (Patient not taking: Reported on 01/11/2024) 30 tablet 11   magnesium oxide (MAG-OX) 400 MG tablet Take by mouth.     methylPREDNISolone (MEDROL DOSEPAK) 4 MG TBPK tablet Take per packet instructions 21 each 0   mirtazapine (REMERON) 30 MG tablet Take 30 mg by mouth daily.     Multiple Vitamins-Minerals (EQL CENTURY WOMENS) TABS      omeprazole (PRILOSEC) 40 MG capsule Take 40 mg by mouth daily.     oxyCODONE (ROXICODONE) 5 MG immediate release tablet Take 1 tablet (5 mg total) by mouth every 4 (four) hours as needed for severe pain (pain score 7-10) or breakthrough pain. 10 tablet 0   Polyethylene Glycol 3350 (MIRALAX PO) Take by mouth at bedtime.     promethazine (PHENERGAN) 25 MG tablet Take 1 tablet (25 mg total) by mouth every 8 (eight) hours as needed for nausea or vomiting. 10 tablet 0   pseudoephedrine (SUDAFED 12 HOUR) 120 MG 12 hr tablet Take 1 tablet (120 mg total) by mouth 2 (two) times daily. 20 tablet 0   Rimegepant Sulfate (NURTEC) 75 MG TBDP Nurtec ODT 75 mg disintegrating tablet  TAKE ONE TABLET BY MOUTH AS NEEDED. one PER 24 hours     Saline (SIMPLY SALINE) 0.9 % AERS Place 2 each into the nose as directed. Use nightly for sinus hygiene long-term.  Can also be used as many times daily as desired to assist with clearing congested sinuses. 127 mL 11   senna-docusate (SENOKOT-S) 8.6-50 MG tablet Take 1 tablet by mouth daily. 10 tablet 0   tiZANidine (ZANAFLEX) 4 MG tablet Take 4 mg by mouth every 6 (six)  hours as needed for muscle spasms.      traZODone (DESYREL) 50 MG tablet at bedtime as needed. Patient reported that she takes half of a tablet.     UBRELVY 100 MG TABS Take by mouth.     venlafaxine XR (EFFEXOR-XR) 150 MG 24 hr capsule Take 2 capsules (300 mg total) by mouth daily before breakfast. 60 capsule 11   zonisamide (ZONEGRAN) 100 MG capsule Take 100 mg by mouth at bedtime.     No current facility-administered medications for this visit.   No results found.  Review of Systems:   A ROS was performed including pertinent positives and negatives as documented in the HPI.   Musculoskeletal Exam:    Last menstrual period 08/26/2018.  Right knee and right hip incisions are well-appearing without evidence of erythema or drainage.  Mild skin irritation noted around the bandage in the right inguinal crease.  DP and PT pulses palpable with capillary refill less than 4 seconds to all 5 toes.  Imaging:    Assessment:   Patient is 6 days status post right knee lateral femoral condyle chondroplasty with right  hip marrow aspiration.  She has been compliant with brace usage.  Dressing was reapplied today and incisions are well-appearing.  She does have some small irritation near the right hip incision which I believe is more friction and bandage related.  Discussed that I would like her to continue monitoring this and if this worsens she can return for dressing change if needed.  She is getting good blood supply to the foot so I did discuss that her numbness could be inflammation or compression related versus stemming from the low back.  Will have her follow-up on this next week and postop visit and MRI follow-up with Ellin Goodie.  Advised use of MiraLAX for constipation as patient has not had bowel movement since surgery.  Plan :    -Return to clinic next week for scheduled postop visit and suture removal      I personally saw and evaluated the patient, and participated in the  management and treatment plan.  Hazle Nordmann, PA-C Orthopedics

## 2024-01-25 ENCOUNTER — Encounter (HOSPITAL_BASED_OUTPATIENT_CLINIC_OR_DEPARTMENT_OTHER): Payer: Self-pay | Admitting: Orthopaedic Surgery

## 2024-01-25 ENCOUNTER — Ambulatory Visit: Payer: BC Managed Care – PPO | Admitting: Physical Medicine and Rehabilitation

## 2024-01-25 ENCOUNTER — Encounter (HOSPITAL_BASED_OUTPATIENT_CLINIC_OR_DEPARTMENT_OTHER): Payer: Self-pay | Admitting: Physical Therapy

## 2024-01-25 ENCOUNTER — Encounter: Payer: Self-pay | Admitting: Physical Medicine and Rehabilitation

## 2024-01-25 ENCOUNTER — Ambulatory Visit (HOSPITAL_BASED_OUTPATIENT_CLINIC_OR_DEPARTMENT_OTHER): Payer: Self-pay | Attending: Orthopaedic Surgery | Admitting: Physical Therapy

## 2024-01-25 VITALS — BP 138/95 | HR 123

## 2024-01-25 DIAGNOSIS — M5442 Lumbago with sciatica, left side: Secondary | ICD-10-CM

## 2024-01-25 DIAGNOSIS — M25562 Pain in left knee: Secondary | ICD-10-CM | POA: Insufficient documentation

## 2024-01-25 DIAGNOSIS — M5416 Radiculopathy, lumbar region: Secondary | ICD-10-CM

## 2024-01-25 DIAGNOSIS — M25561 Pain in right knee: Secondary | ICD-10-CM | POA: Insufficient documentation

## 2024-01-25 DIAGNOSIS — M5441 Lumbago with sciatica, right side: Secondary | ICD-10-CM

## 2024-01-25 DIAGNOSIS — R2689 Other abnormalities of gait and mobility: Secondary | ICD-10-CM | POA: Insufficient documentation

## 2024-01-25 DIAGNOSIS — M25661 Stiffness of right knee, not elsewhere classified: Secondary | ICD-10-CM | POA: Insufficient documentation

## 2024-01-25 DIAGNOSIS — G8929 Other chronic pain: Secondary | ICD-10-CM

## 2024-01-25 DIAGNOSIS — M48061 Spinal stenosis, lumbar region without neurogenic claudication: Secondary | ICD-10-CM | POA: Diagnosis not present

## 2024-01-25 DIAGNOSIS — R600 Localized edema: Secondary | ICD-10-CM | POA: Diagnosis present

## 2024-01-25 NOTE — Therapy (Signed)
 OUTPATIENT PHYSICAL THERAPY LOWER EXTREMITY EVALUATION   Patient Name: Diane [name] MRN: [id number] DOB:[birth year], 43 y.o., female Today's Date: 01/26/2024  END OF SESSION:  PT End of Session - 01/25/24 1348     Visit Number 1    Number of Visits 12    Date for PT Re-Evaluation 01/28/24    Authorization Type [employer] 20 VL    PT Start Time 1230    PT Stop Time 1313    PT Time Calculation (min) 43 min    Activity Tolerance Patient tolerated treatment well    Behavior During Therapy WFL for tasks assessed/performed             Past Medical History:  Diagnosis Date   ADD (attention deficit disorder with hyperactivity)    Allergy    Anxiety    Arthritis    Atypical chest pain 07/26/2021   Atypical chest pain     Benign intracranial hypertension 06/16/2008   Qualifier: Diagnosis of   By: Nira Conn         Choking    due to food and pills get stuck   Cholelithiasis and acute cholecystitis without obstruction 12/02/2016   Chronic cough 12/27/2013   Depression    Dry mouth 08/12/2017   Dysmenorrhea    Family history of adverse reaction to anesthesia    pt's father has hx. of being hard to wake up post-op   Gastric ulcer 03/16/2015   GERD (gastroesophageal reflux disease)    Headache 07/09/2007   Qualifier: Diagnosis of   By: Cato Mulligan MD, Bruce      Replacing diagnoses that were inactivated after the 02/16/23 regulatory import     History of gastric ulcer    History of kidney stones    Hyperlipidemia    IBS (irritable bowel syndrome)    Lower extremity edema 07/26/2021   Lower extremity edema     Migraines    neurologist-  dr c. Sharene Skeans ([facility] in [place])-- treated with Emgality injection every 30 days   Mild obstructive sleep apnea    per study in epic 03-21-2017 mild osa, recommendation cpap, mouth appliance, wt loss   Nausea and vomiting 12/03/2016   OAB (overactive bladder) 08/12/2017   Osteoarthritis    right ankle   Osteochondral  lesion 09/2016   right ankle   Osteomyelitis of knee region [facility])    Peripheral vascular disease (HCC)    NERVE DAMAGE RIGHT LEG AND FOOT DUE TO INJURGY.    Plantar fasciitis of left foot    Pleural effusion 12/15/2013   PONV (postoperative nausea and vomiting)    AND HEADACHES   Pseudotumor cerebri    at back head   RUQ pain    S/P dilatation of esophageal stricture 07/2018   Sinusitis 08/12/2018   RESOLVED WITH ANTIOBIOTIC   SUI (stress urinary incontinence, female)    Wears contact lenses    Wears partial dentures    upper   Past Surgical History:  Procedure Laterality Date   ANKLE ARTHROSCOPY Right 10/22/2016   Procedure: ANKLE ARTHROSCOPY;  Surgeon: Vivi Barrack, DPM;  Location: [facility];  Service: Podiatry;  Laterality: Right;  GENERAL/REG BLOCK   ANKLE ARTHROSCOPY Right 09-15-2017   @[facility]    BLADDER SURGERY  child   x 2 - as a child to stretch bladder   CESAREAN SECTION  12/24/2009   CESAREAN SECTION  11/20/2011   Procedure: CESAREAN SECTION;  Surgeon: Lenoard Aden, MD;  Location: [facility]  ORS;  Service: Gynecology;  Laterality: N/A;   CHOLECYSTECTOMY N/A 12/03/2016   Procedure: LAPAROSCOPIC CHOLECYSTECTOMY WITH INTRAOPERATIVE CHOLANGIOGRAM;  Surgeon: Darnell Level, MD;  Location: [facility];  Service: General;  Laterality: N/A;   CHONDROPLASTY Right 01/12/2024   Procedure: RIGHT KNEE ARTHROSCOPY LATERAL FEMORAL CHONDROPLASTY;  Surgeon: [name], MD;  Location: [facility];  Service: Orthopedics;  Laterality: Right;   CYSTOSCOPY WITH RETROGRADE PYELOGRAM, URETEROSCOPY AND STENT PLACEMENT Left 08/10/2006   ESOPHAGOGASTRODUODENOSCOPY (EGD) WITH ESOPHAGEAL DILATION  07/2018   HARDWARE REMOVAL Right 07/01/2016   Procedure: HARDWARE REMOVAL;  Surgeon: Cammy Copa, MD;  Location: [facility] OR;  Service: Orthopedics;  Laterality: Right;   HARVEST BONE GRAFT Right 01/12/2024   Procedure: RIGHT BONE MARROW ASPIRATE FROM ILIAC CREST DECOMPRESSION AND  PLACEMENT RIGHT LATERAL FEMORAL CONDYLE;  Surgeon: [name], MD;  Location: [facility];  Service: Orthopedics;  Laterality: Right;   KNEE ARTHROSCOPY WITH LATERAL RELEASE Right 12/13/2014   Procedure: KNEE ARTHROSCOPY WITH LATERAL RELEASE;  Surgeon: Thera Flake., MD;  Location: [facility];  Service: Orthopedics;  Laterality: Right;   LAPAROSCOPIC APPENDECTOMY N/A 06/11/2014   Procedure: APPENDECTOMY LAPAROSCOPIC;  Surgeon: Velora Heckler, MD;  Location: [facility];  Service: General;  Laterality: N/A;   ORIF ANKLE FRACTURE Right 01/05/2016   Procedure: OPEN REDUCTION INTERNAL FIXATION (ORIF) ANKLE FRACTURE;  Surgeon: Cammy Copa, MD;  Location: [facility];  Service: Orthopedics;  Laterality: Right;   OVARIAN CYST SURGERY Right 03/05/2007   AND CHROMOPERTUBATION  VIA LAPAROSCOPY   ROBOTIC ASSISTED LAPAROSCOPIC HYSTERECTOMY AND SALPINGECTOMY Bilateral 08/27/2018   Procedure: XI ROBOTIC ASSISTED LAPAROSCOPIC HYSTERECTOMY AND BILATERAL SALPINGECTOMY;  Surgeon: Olivia Mackie, MD;  Location: [facility];  Service: Gynecology;  Laterality: Bilateral;  [facility] for 23hr OBS   SYNOVECTOMY Right 12/13/2014   Procedure: PLICA SYNOVECTOMY;  Surgeon: Thera Flake., MD;  Location: [facility];  Service: Orthopedics;  Laterality: Right;   TONSILLECTOMY AND ADENOIDECTOMY  child   WISDOM TOOTH EXTRACTION     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 01/12/2024   AVN (avascular necrosis of bone) (HCC) 01/12/2024   Prediabetes 07/14/2023   Weight disorder 07/14/2023   Ankle swelling 07/14/2023   Menopausal syndrome (hot flushes) 07/13/2023   Hyperlipidemia 06/19/2023   Intractable pain 06/17/2023   Chronic constipation 06/17/2023   Other idiopathic scoliosis, lumbar region 06/17/2023   Dysphagia 08/12/2017   Osteochondral lesion 06/03/2017   OSA (obstructive sleep apnea) 03/24/2017   Ankle pain, chronic  01/05/2016   Knee pain, right, chronic 04/12/2015   Chronic migraine without aura 04/01/2013   DEPRESSION, ACUTE 10/02/2008   Opioid use disorder in remission 10/02/2008   back pain, chronic 03/28/2008   Obesity (BMI 30-39.9) 01/25/2008   Anxiety state 11/09/2007    PCP: Glenetta Hew MD  REFERRING PROVIDER: [name] MD  REFERRING DIAG: RIGHT KNEE ARTHROSCOPY LATERAL FEMORAL CHONDROPLASTYRightGeneral RIGHT BONE MARROW ASPIRATE FROM ILIAC CREST DECOMPRESSION AND PLACEMENT RIGHT LATERAL FEMORAL CONDYLE  THERAPY DIAG:  Stiffness of right knee, not elsewhere classified  Localized edema  Acute pain of right knee  Other abnormalities of gait and mobility  Rationale for Evaluation and Treatment: Rehabilitation  ONSET DATE: 01/12/2024  SUBJECTIVE:   SUBJECTIVE STATEMENT: The patient has had a progressive history of knee pain and instability. She had several falls prior to her surgery. She was found to have a chondral defect and avascular  necrosis. This was surgically repaired on 01/12/2024. At this time she is having increased low back pain. She had low back issues prior to her surgery. She is also having irritation around the iliac aspiration site. 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 Yes: NPRS scale: 6-7/10 right now 8/10 at worst  Pain location: over the knee cap  Pain description: shooting  Aggravating factors:standing and walking  Relieving factors: rest   PRECAUTIONS: None  RED FLAGS: None   WEIGHT BEARING RESTRICTIONS: Yes WBAT   FALLS:  Has patient fallen in last 6 months? Yes. Number of falls fallen a few times. Not sure why   LIVING ENVIRONMENT: Lives with:  At moms right now 3  into that house   10 steps at her house to get in and a second floor at home.   OCCUPATION:  Stay at home mom  Hobbies:  Working out  Walking     PLOF: Independent  PATIENT GOALS:  To be able to walk and work out    NEXT MD VISIT:  Wednesday next week.   OBJECTIVE:  Note: Objective measures were completed at Evaluation unless otherwise noted.  DIAGNOSTIC FINDINGS: nothing post op   PATIENT SURVEYS:  LEFS 55/80  COGNITION: Overall cognitive status: Within functional limits for tasks assessed     SENSATION: WFL  EDEMA:  Circumferential:     POSTURE: rounded shoulders and forward head  PALPATION: Significant spasming in bilateral lumbar paraspinals and gluteals.   LOWER EXTREMITY ROM:  Passive ROM Right eval Left eval  Hip flexion    Hip extension    Hip abduction    Hip adduction    Hip internal rotation    Hip external rotation    Knee flexion  86  Knee extension  -5  Ankle dorsiflexion    Ankle plantarflexion    Ankle inversion    Ankle eversion     (Blank rows = not tested)  LOWER EXTREMITY MMT:  MMT Right eval Left eval  Hip flexion    Hip extension    Hip abduction    Hip adduction    Hip internal rotation    Hip external rotation    Knee flexion    Knee extension    Ankle dorsiflexion    Ankle plantarflexion    Ankle inversion    Ankle eversion     (Blank rows = not tested) not tested 2nd to recent surgery    GAIT: Walking with minimal weight bearing and crutches. Patient reports she uses a Veterinary surgeon at home  TREATMENT DATE:   Exercises - Seated Bilateral Shoulder Flexion Towel Slide at Table Top  - 1 x daily - 7 x weekly - 3 sets - 10 reps - Standing Glute Med Mobilization with Small Ball on Wall  - 1 x daily - 7 x weekly - 3 sets - 10 reps - Supine Quad Set  - 1 x daily - 7 x weekly - 3 sets - 10  reps - Supine Gluteal Sets  - 1 x daily - 7 x weekly - 3 sets - 10 reps - Seated Ankle Pumps  - 1 x daily - 7 x weekly - 3 sets - 10 reps  Manual: PROM into flexion and extension; review of self trigger point release to lumbar spine     PATIENT EDUCATION:  Education details: HEP, symptom management  Person educated: Patient Education method: Explanation, Demonstration, Tactile cues, Verbal cues, and Handouts Education comprehension: verbalized understanding, returned demonstration, verbal cues required, tactile cues required, and needs further education  HOME EXERCISE PROGRAM: Access Code: [email or web address] URL: [email or web address] Date: 01/26/2024 Prepared by: Lorayne Bender  ASSESSMENT:  CLINICAL IMPRESSION: Patient is a 43 year old female S/P chondroplasty and decompression of the right knee. She presents with expected limitations in motion, strength, and functional mobility. She is also having low back pain at this time which is limiting her mobility. We reviewed stretches and soft tissue mobilization for her low back this visit. We also reviewed self soft tissue mobilization for the lumbar spine. She would benefit from further skilled therapy to improve her ability to ambulate and perform daily tasks without pain   OBJECTIVE IMPAIRMENTS: Abnormal gait, decreased activity tolerance, decreased endurance, decreased knowledge of use of DME, decreased mobility, difficulty walking, decreased ROM, decreased strength, increased edema, and pain.   ACTIVITY LIMITATIONS: carrying, lifting, bending, standing, squatting, sleeping, stairs, transfers, dressing, locomotion level, and caring for others  PARTICIPATION LIMITATIONS: meal prep, cleaning, laundry, driving, shopping, community activity, and yard work  PERSONAL FACTORS: 1-2 comorbidities: right ankle ORIF/ Lumbar spine pain    are also affecting patient's functional outcome.   REHAB POTENTIAL: Good  CLINICAL DECISION MAKING:  Evolving/moderate complexity lumbar spine pain decreasing overall mobility   EVALUATION COMPLEXITY: Low   GOALS: Goals reviewed with patient? Yes  SHORT TERM GOALS: Target date: 02/23/2024   Patient will increase passive right knee flexion to 120 degrees Baseline: Goal status: INITIAL  2.  Patient will report a 50% reduction in pain in her low back  Baseline:  Goal status: INITIAL  3.  Patient will progress off assistive device Baseline:  Goal status: INITIAL  4.  Patient will be independent with initial HEP  Baseline:  Goal status: INITIAL   LONG TERM GOALS: Target date: 03/22/2024    Patient will ambulate community distances without increased pain  Baseline:  Goal status: INITIAL  2.  Patient will go up/down 12 steps without increased pain in order to get to her bedroom  Baseline:  Goal status: INITIAL  3.  Patient will squat to pick item off the goound without pain  Baseline:  Goal status: INITIAL  4.  Patient will have a complete gym and home program  Baseline:  Goal status: INITIAL  PLAN:  PT FREQUENCY: 2x/week  PT DURATION: 8 weeks  PLANNED INTERVENTIONS: 97110-Therapeutic exercises, 97530- Therapeutic activity, [id number]- Neuromuscular re-education, 97535- Self Care, [id number]- Manual therapy, [id number]- Gait training, [phone]- Aquatic Therapy, [id number]- Electrical stimulation (unattended), 97035- Ultrasound, Patient/Family education, Stair training, Taping, Dry Needling, DME instructions, Cryotherapy,  and Moist heat  PLAN FOR NEXT SESSION:  Progress per patient tolerance. Progress weight bearing next visit. Continue with ROM as tolerated. Consider SLR but consider lumbar spine. STM to lumbar spine. Patient will have an MRI of her spine soon.    Dessie Coma, PT 01/26/2024, 8:00 AM

## 2024-01-25 NOTE — Progress Notes (Unsigned)
 Pain Scale   Average Pain 9

## 2024-01-25 NOTE — Progress Notes (Unsigned)
 Diane [name] - 43 y.o. female MRN [id number]  Date of birth: [birth year]  Office Visit Note: Visit Date: 01/25/2024 PCP: Lula Olszewski, MD Referred by: Lula Olszewski, MD  Subjective: Chief Complaint  Patient presents with   Lower Back - Pain   HPI: LAKEYIA SURBER is a 43 y.o. female who comes in today per the request of Dr. Steward Drone for evaluation of chronic, worsening and severe bilateral lower back pain radiating down to buttocks and posterior thighs to knees. Pain ongoing for several years intermittently that is typically self limiting. Her pain has worsened over the last several weeks post right knee arthroscopy with Dr. Steward Drone on 01/12/2024. Her pain becomes severe with movement and activity. She reports severe pain with household chores such as washing dishes and laundry. Also reports pain with bending. She describes her pain as constant and sharp sensation, currently rates as 9 out of 10. Also reports coldness and numbness to right foot post surgery, she has seen Hazle Nordmann, PA recently for these issues. Some relief of pain with home exercise regimen, rest and use of medications. Recent lumbar MRI imaging shows mild disc degeneration at L3-L4 and L4-L5, borderline to mild bilateral L4 foraminal stenosis. No high grade spinal canal stenosis noted. Overall, findings are mild for her age. She is using crutches to assist with ambulation today. Patient denies focal weakness. No recent trauma or falls.      Review of Systems  Musculoskeletal:  Positive for back pain and myalgias.  Neurological:  Positive for tingling. Negative for focal weakness and weakness.  All other systems reviewed and are negative.  Otherwise per HPI.  Assessment & Plan: Visit Diagnoses:    ICD-10-CM   1. Chronic bilateral low back pain with bilateral sciatica  M54.42 Ambulatory referral to Physical Medicine Rehab   M54.41    G89.29     2. Lumbar radiculopathy  M54.16 Ambulatory referral to  Physical Medicine Rehab    3. Foraminal stenosis of lumbar region  M48.061 Ambulatory referral to Physical Medicine Rehab       Plan: Findings:  Chronic, worsening and severe bilateral lower back pain radiating to buttocks and down posterior thighs to knees. Patient continues to have severe pain despite good conservative therapies such as home exercise regimen, rest and use of medications. Patients clinical presentation and exam are complex, her pain pattern does fit more of S1 distribution. Lumbar MRI imaging shows mild bilateral foraminal stenosis. Her pain does not directly correlate with imaging. We discussed treatment plan in detail today. Next step is to perform diagnostic and hopefully therapeutic L4-L5 interlaminar epidural steroid injection under fluoroscopic guidance. She is not currently taking anticoagulant medications. I discussed injection procedure in detail today, she has no questions at this time. If good relief of pain with injection we can repeat this procedure infrequently as needed. She can continue with follow up with Dr. Steward Drone post op. We will see her back for injection. No red flag symptoms noted upon exam today.     Meds & Orders: No orders of the defined types were placed in this encounter.   Orders Placed This Encounter  Procedures   Ambulatory referral to Physical Medicine Rehab    Follow-up: Return for L4-L5 interlaminar epidural steroid injection.   Procedures: No procedures performed      Clinical History: CLINICAL DATA:  43 year old female with low back pain radiating down the right leg.   EXAM: MRI LUMBAR SPINE WITHOUT CONTRAST   TECHNIQUE:  Multiplanar, multisequence MR imaging of the lumbar spine was performed. No intravenous contrast was administered.   COMPARISON:  Lumbar radiographs 06/17/2023.   FINDINGS: Segmentation:  Normal on the comparison.  Hypoplastic ribs at T12.   Alignment: Normal lumbar lordosis with subtle retrolisthesis of  L3 on L4.   Vertebrae: Normal vertebral body height. Normal background bone marrow signal. Intact visible sacrum and SI joints. No marrow edema or evidence of acute osseous abnormality.   Conus medullaris and cauda equina: Conus extends to the T12-L1 level. No lower spinal cord or conus signal abnormality. Capacious spinal canal. Normal cauda equina nerve roots.   Paraspinal and other soft tissues: Negative.   Disc levels:   T11-T12: Negative.   T12-L1:  Negative.   L1-L2:  Negative.   L2-L3:  Negative.   L3-L4: Disc desiccation. Subtle disc space loss and disc bulging asymmetric to the left. No stenosis.   L4-L5: Disc desiccation. Subtle disc space loss and circumferential disc bulging. Mild facet and ligament flavum hypertrophy. No spinal or lateral recess stenosis. Borderline to mild bilateral L4 neural foraminal stenosis.   L5-S1:  Negative.   IMPRESSION: 1. Mild disc degeneration at L3-L4 and L4-L5. Borderline to mild bilateral L4 nerve level foraminal stenosis. 2. Elsewhere normal MRI appearance of the Lumbar Spine.     Electronically Signed   By: Odessa Fleming M.D.   On: 01/08/2024 11:42   She reports that she has quit smoking. Her smoking use included cigarettes. She has a 7.5 pack-year smoking history. She has never used smokeless tobacco.  Recent Labs    07/13/23 1637  HGBA1C 5.8*    Objective:  VS:  HT:    WT:   BMI:     BP:(!) 138/95  HR:(!) 123bpm  TEMP: ( )  RESP:  Physical Exam Vitals and nursing note reviewed.  HENT:     Head: Normocephalic and atraumatic.     Right Ear: External ear normal.     Left Ear: External ear normal.     Nose: Nose normal.     Mouth/Throat:     Mouth: Mucous membranes are moist.  Eyes:     Extraocular Movements: Extraocular movements intact.  Cardiovascular:     Rate and Rhythm: Normal rate.     Pulses: Normal pulses.  Pulmonary:     Effort: Pulmonary effort is normal.  Abdominal:     General: Abdomen is  flat. There is no distension.  Musculoskeletal:        General: Tenderness present.     Cervical back: Normal range of motion.     Comments: Patient rises from seated position to standing without difficulty. Good lumbar range of motion. No pain noted with facet loading. 5/5 strength noted with bilateral hip flexion, knee flexion/extension, ankle dorsiflexion/plantarflexion and EHL. No clonus noted bilaterally. No pain upon palpation of greater trochanters. No pain with internal/external rotation of bilateral hips. Sensation intact bilaterally. Dysesthesias noted to bilateral S1 dermatomes. Negative slump test bilaterally. Ambulates with crutches.  Skin:    General: Skin is warm and dry.     Capillary Refill: Capillary refill takes less than 2 seconds.  Neurological:     Mental Status: She is alert and oriented to person, place, and time.     Gait: Gait abnormal.  Psychiatric:        Mood and Affect: Mood normal.        Behavior: Behavior normal.     Ortho Exam  Imaging: No results found.  Past Medical/Family/Surgical/Social History: Medications & Allergies reviewed per EMR, new medications updated. 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 01/12/2024   AVN (avascular necrosis of bone) (HCC) 01/12/2024   Prediabetes 07/14/2023   Weight disorder 07/14/2023   Ankle swelling 07/14/2023   Menopausal syndrome (hot flushes) 07/13/2023   Hyperlipidemia 06/19/2023   Intractable pain 06/17/2023   Chronic constipation 06/17/2023   Other idiopathic scoliosis, lumbar region 06/17/2023   Dysphagia 08/12/2017   Osteochondral lesion 06/03/2017   OSA (obstructive sleep apnea) 03/24/2017   Ankle pain, chronic 01/05/2016   Knee pain, right, chronic 04/12/2015   Chronic migraine without aura 04/01/2013   DEPRESSION, ACUTE 10/02/2008   Opioid use disorder in remission 10/02/2008   back pain, chronic 03/28/2008   Obesity (BMI 30-39.9)  01/25/2008   Anxiety state 11/09/2007   Past Medical History:  Diagnosis Date   ADD (attention deficit disorder with hyperactivity)    Allergy    Anxiety    Arthritis    Atypical chest pain 07/26/2021   Atypical chest pain     Benign intracranial hypertension 06/16/2008   Qualifier: Diagnosis of   By: Nira Conn         Choking    due to food and pills get stuck   Cholelithiasis and acute cholecystitis without obstruction 12/02/2016   Chronic cough 12/27/2013   Depression    Dry mouth 08/12/2017   Dysmenorrhea    Family history of adverse reaction to anesthesia    pt's father has hx. of being hard to wake up post-op   Gastric ulcer 03/16/2015   GERD (gastroesophageal reflux disease)    Headache 07/09/2007   Qualifier: Diagnosis of   By: Cato Mulligan MD, Bruce      Replacing diagnoses that were inactivated after the 02/16/23 regulatory import     History of gastric ulcer    History of kidney stones    Hyperlipidemia    IBS (irritable bowel syndrome)    Lower extremity edema 07/26/2021   Lower extremity edema     Migraines    neurologist-  dr c. Sharene Skeans ([facility] in [place])-- treated with Emgality injection every 30 days   Mild obstructive sleep apnea    per study in epic 03-21-2017 mild osa, recommendation cpap, mouth appliance, wt loss   Nausea and vomiting 12/03/2016   OAB (overactive bladder) 08/12/2017   Osteoarthritis    right ankle   Osteochondral lesion 09/2016   right ankle   Osteomyelitis of knee region [facility])    Peripheral vascular disease (HCC)    NERVE DAMAGE RIGHT LEG AND FOOT DUE TO INJURGY.    Plantar fasciitis of left foot    Pleural effusion 12/15/2013   PONV (postoperative nausea and vomiting)    AND HEADACHES   Pseudotumor cerebri    at back head   RUQ pain    S/P dilatation of esophageal stricture 07/2018   Sinusitis 08/12/2018   RESOLVED WITH ANTIOBIOTIC   SUI (stress urinary incontinence, female)    Wears contact lenses     Wears partial dentures    upper   Family History  Problem Relation Age of Onset   Learning disabilities Mother    Asthma Mother    Arthritis Mother    Irritable bowel syndrome Mother    Miscarriages / [place] Mother    Hypertension Father    Early death Father    Drug abuse Father  Depression Father    Cancer Father    Arthritis Father    Alcohol abuse Father    Brain cancer Father    Anesthesia problems Father        hard to wake up post-op   Lung cancer Father    Hyperlipidemia Maternal Grandmother    Hypertension Maternal Grandmother    Cancer Maternal Grandmother    Asthma Maternal Grandmother    Arthritis Maternal Grandmother    Breast cancer Maternal Grandmother    Ovarian cancer Maternal Grandmother    Diabetes Maternal Grandmother    Heart disease Maternal Grandmother    Heart attack Maternal Grandmother    Hypertension Maternal Grandfather    Hyperlipidemia Maternal Grandfather    Arthritis Maternal Grandfather    Alcohol abuse Maternal Grandfather    Cancer Maternal Grandfather    Prostate cancer Maternal Grandfather    Melanoma Maternal Grandfather    Cancer - Prostate Maternal Grandfather    Hypertension Paternal Grandmother    COPD Paternal Grandmother    Arthritis Paternal Grandmother    Learning disabilities Daughter    Learning disabilities Son    Colon cancer Neg Hx    Past Surgical History:  Procedure Laterality Date   ANKLE ARTHROSCOPY Right 10/22/2016   Procedure: ANKLE ARTHROSCOPY;  Surgeon: Vivi Barrack, DPM;  Location: [facility];  Service: Podiatry;  Laterality: Right;  GENERAL/REG BLOCK   ANKLE ARTHROSCOPY Right 09-15-2017   @[facility]    BLADDER SURGERY  child   x 2 - as a child to stretch bladder   CESAREAN SECTION  12/24/2009   CESAREAN SECTION  11/20/2011   Procedure: CESAREAN SECTION;  Surgeon: Lenoard Aden, MD;  Location: [facility];  Service: Gynecology;  Laterality: N/A;   CHOLECYSTECTOMY N/A 12/03/2016    Procedure: LAPAROSCOPIC CHOLECYSTECTOMY WITH INTRAOPERATIVE CHOLANGIOGRAM;  Surgeon: Darnell Level, MD;  Location: [facility];  Service: General;  Laterality: N/A;   CHONDROPLASTY Right 01/12/2024   Procedure: RIGHT KNEE ARTHROSCOPY LATERAL FEMORAL CHONDROPLASTY;  Surgeon: [name], MD;  Location: [facility];  Service: Orthopedics;  Laterality: Right;   CYSTOSCOPY WITH RETROGRADE PYELOGRAM, URETEROSCOPY AND STENT PLACEMENT Left 08/10/2006   ESOPHAGOGASTRODUODENOSCOPY (EGD) WITH ESOPHAGEAL DILATION  07/2018   HARDWARE REMOVAL Right 07/01/2016   Procedure: HARDWARE REMOVAL;  Surgeon: Cammy Copa, MD;  Location: [facility];  Service: Orthopedics;  Laterality: Right;   HARVEST BONE GRAFT Right 01/12/2024   Procedure: RIGHT BONE MARROW ASPIRATE FROM ILIAC CREST DECOMPRESSION AND PLACEMENT RIGHT LATERAL FEMORAL CONDYLE;  Surgeon: [name], MD;  Location: [facility];  Service: Orthopedics;  Laterality: Right;   KNEE ARTHROSCOPY WITH LATERAL RELEASE Right 12/13/2014   Procedure: KNEE ARTHROSCOPY WITH LATERAL RELEASE;  Surgeon: Thera Flake., MD;  Location: [facility];  Service: Orthopedics;  Laterality: Right;   LAPAROSCOPIC APPENDECTOMY N/A 06/11/2014   Procedure: APPENDECTOMY LAPAROSCOPIC;  Surgeon: Velora Heckler, MD;  Location: [facility];  Service: General;  Laterality: N/A;   ORIF ANKLE FRACTURE Right 01/05/2016   Procedure: OPEN REDUCTION INTERNAL FIXATION (ORIF) ANKLE FRACTURE;  Surgeon: Cammy Copa, MD;  Location: [facility];  Service: Orthopedics;  Laterality: Right;   OVARIAN CYST SURGERY Right 03/05/2007   AND CHROMOPERTUBATION  VIA LAPAROSCOPY   ROBOTIC ASSISTED LAPAROSCOPIC HYSTERECTOMY AND SALPINGECTOMY Bilateral 08/27/2018   Procedure: XI ROBOTIC ASSISTED LAPAROSCOPIC HYSTERECTOMY AND BILATERAL SALPINGECTOMY;  Surgeon: Olivia Mackie, MD;  Location: [facility];  Service: Gynecology;  Laterality: Bilateral;  [facility] for 23hr  OBS   SYNOVECTOMY Right  12/13/2014   Procedure: PLICA SYNOVECTOMY;  Surgeon: Thera Flake., MD;  Location: [facility];  Service: Orthopedics;  Laterality: Right;   TONSILLECTOMY AND ADENOIDECTOMY  child   WISDOM TOOTH EXTRACTION     Social History   Occupational History   Occupation: Investment banker, 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  Tobacco Use   Smoking status: Former    Current packs/day: 0.50    Average packs/day: 0.5 packs/day for 15.0 years (7.5 ttl pk-yrs)    Types: Cigarettes   Smokeless tobacco: Never  Vaping Use   Vaping status: Every Day   Substances: Nicotine  Substance and Sexual Activity   Alcohol use: Yes    Comment: occasional   Drug use: No   Sexual activity: Yes    Birth control/protection: Surgical    Comment: partial hysterectomy

## 2024-01-27 ENCOUNTER — Other Ambulatory Visit (HOSPITAL_BASED_OUTPATIENT_CLINIC_OR_DEPARTMENT_OTHER): Payer: Self-pay

## 2024-01-27 ENCOUNTER — Ambulatory Visit (HOSPITAL_BASED_OUTPATIENT_CLINIC_OR_DEPARTMENT_OTHER): Payer: BC Managed Care – PPO | Admitting: Orthopaedic Surgery

## 2024-01-27 DIAGNOSIS — R21 Rash and other nonspecific skin eruption: Secondary | ICD-10-CM

## 2024-01-27 MED ORDER — KETOCONAZOLE 2 % EX CREA: TOPICAL_CREAM | Freq: Every day | CUTANEOUS | Status: DC | PRN

## 2024-01-27 MED ORDER — KETOCONAZOLE 2 % EX CREA
1.0000 | TOPICAL_CREAM | Freq: Every day | CUTANEOUS | 0 refills | Status: DC
  Filled 2024-01-27: qty 15, 15d supply, fill #0

## 2024-01-27 NOTE — Progress Notes (Signed)
 Post Operative Evaluation    Procedure/Date of Surgery: Right knee arthroscopy with lateral femoral chondroplasty, decompression 2/25  Interval History:    Presents today 2 weeks status post the above procedure.  Overall she is continuing to improve.  She has been able to put some weight on the knee.  There is mild swelling although her knee overall is feeling much better   PMH/PSH/Family History/Social History/Meds/Allergies:    Past Medical History:  Diagnosis Date   ADD (attention deficit disorder with hyperactivity)    Allergy    Anxiety    Arthritis    Atypical chest pain 07/26/2021   Atypical chest pain     Benign intracranial hypertension 06/16/2008   Qualifier: Diagnosis of   By: Zimonjic, Milica         Choking    due to food and pills get stuck   Cholelithiasis and acute cholecystitis without obstruction 12/02/2016   Chronic cough 12/27/2013   Depression    Dry mouth 08/12/2017   Dysmenorrhea    Family history of adverse reaction to anesthesia    pt's father has hx. of being hard to wake up post-op   Gastric ulcer 03/16/2015   GERD (gastroesophageal reflux disease)    Headache 07/09/2007   Qualifier: Diagnosis of   By: Cato Mulligan MD, Bruce      Replacing diagnoses that were inactivated after the 02/16/23 regulatory import     History of gastric ulcer    History of kidney stones    Hyperlipidemia    IBS (irritable bowel syndrome)    Lower extremity edema 07/26/2021   Lower extremity edema     Migraines    neurologist-  dr c. Sharene Skeans ([facility] in [place])-- treated with Emgality injection every 30 days   Mild obstructive sleep apnea    per study in epic 03-21-2017 mild osa, recommendation cpap, mouth appliance, wt loss   Nausea and vomiting 12/03/2016   OAB (overactive bladder) 08/12/2017   Osteoarthritis    right ankle   Osteochondral lesion 09/2016   right ankle   Osteomyelitis of knee region [facility])     Peripheral vascular disease (HCC)    NERVE DAMAGE RIGHT LEG AND FOOT DUE TO INJURGY.    Plantar fasciitis of left foot    Pleural effusion 12/15/2013   PONV (postoperative nausea and vomiting)    AND HEADACHES   Pseudotumor cerebri    at back head   RUQ pain    S/P dilatation of esophageal stricture 07/2018   Sinusitis 08/12/2018   RESOLVED WITH ANTIOBIOTIC   SUI (stress urinary incontinence, female)    Wears contact lenses    Wears partial dentures    upper   Past Surgical History:  Procedure Laterality Date   ANKLE ARTHROSCOPY Right 10/22/2016   Procedure: ANKLE ARTHROSCOPY;  Surgeon: Vivi Barrack, DPM;  Location: [facility];  Service: Podiatry;  Laterality: Right;  GENERAL/REG BLOCK   ANKLE ARTHROSCOPY Right 09-15-2017   @[facility]    BLADDER SURGERY  child   x 2 - as a child to stretch bladder   CESAREAN SECTION  12/24/2009   CESAREAN SECTION  11/20/2011   Procedure: CESAREAN SECTION;  Surgeon: Lenoard Aden, MD;  Location: [facility];  Service: Gynecology;  Laterality: N/A;   CHOLECYSTECTOMY N/A 12/03/2016  Procedure: LAPAROSCOPIC CHOLECYSTECTOMY WITH INTRAOPERATIVE CHOLANGIOGRAM;  Surgeon: Darnell Level, MD;  Location: [facility];  Service: General;  Laterality: N/A;   CHONDROPLASTY Right 01/12/2024   Procedure: RIGHT KNEE ARTHROSCOPY LATERAL FEMORAL CHONDROPLASTY;  Surgeon: [name], MD;  Location: [facility];  Service: Orthopedics;  Laterality: Right;   CYSTOSCOPY WITH RETROGRADE PYELOGRAM, URETEROSCOPY AND STENT PLACEMENT Left 08/10/2006   ESOPHAGOGASTRODUODENOSCOPY (EGD) WITH ESOPHAGEAL DILATION  07/2018   HARDWARE REMOVAL Right 07/01/2016   Procedure: HARDWARE REMOVAL;  Surgeon: Cammy Copa, MD;  Location: [facility];  Service: Orthopedics;  Laterality: Right;   HARVEST BONE GRAFT Right 01/12/2024   Procedure: RIGHT BONE MARROW ASPIRATE FROM ILIAC CREST DECOMPRESSION AND PLACEMENT RIGHT LATERAL FEMORAL CONDYLE;  Surgeon: [name], MD;   Location: [facility];  Service: Orthopedics;  Laterality: Right;   KNEE ARTHROSCOPY WITH LATERAL RELEASE Right 12/13/2014   Procedure: KNEE ARTHROSCOPY WITH LATERAL RELEASE;  Surgeon: Thera Flake., MD;  Location: [facility];  Service: Orthopedics;  Laterality: Right;   LAPAROSCOPIC APPENDECTOMY N/A 06/11/2014   Procedure: APPENDECTOMY LAPAROSCOPIC;  Surgeon: Velora Heckler, MD;  Location: [facility];  Service: General;  Laterality: N/A;   ORIF ANKLE FRACTURE Right 01/05/2016   Procedure: OPEN REDUCTION INTERNAL FIXATION (ORIF) ANKLE FRACTURE;  Surgeon: Cammy Copa, MD;  Location: [facility];  Service: Orthopedics;  Laterality: Right;   OVARIAN CYST SURGERY Right 03/05/2007   AND CHROMOPERTUBATION  VIA LAPAROSCOPY   ROBOTIC ASSISTED LAPAROSCOPIC HYSTERECTOMY AND SALPINGECTOMY Bilateral 08/27/2018   Procedure: XI ROBOTIC ASSISTED LAPAROSCOPIC HYSTERECTOMY AND BILATERAL SALPINGECTOMY;  Surgeon: Olivia Mackie, MD;  Location: [facility];  Service: Gynecology;  Laterality: Bilateral;  [facility] for 23hr OBS   SYNOVECTOMY Right 12/13/2014   Procedure: PLICA SYNOVECTOMY;  Surgeon: Thera Flake., MD;  Location: [facility];  Service: Orthopedics;  Laterality: Right;   TONSILLECTOMY AND ADENOIDECTOMY  child   WISDOM TOOTH EXTRACTION     Social History   Socioeconomic History   Marital status: Married    Spouse name: Not on file   Number of children: 2   Years of education: Not on file   Highest education level: Not on file  Occupational History   Occupation: Investment banker, 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  Tobacco Use   Smoking status: Former    Current packs/day: 0.50    Average packs/day: 0.5 packs/day for 15.0 years (7.5 ttl pk-yrs)    Types: Cigarettes   Smokeless tobacco: Never  Vaping Use   Vaping status: Every Day   Substances: Nicotine  Substance and Sexual Activity   Alcohol use: Yes    Comment: occasional   Drug use: No   Sexual  activity: Yes    Birth control/protection: Surgical    Comment: partial hysterectomy  Other Topics Concern   Not on file  Social History Narrative   Not on file   Social Drivers of Health   Financial Resource Strain: Not on file  Food Insecurity: Not on file  Transportation Needs: Not on file  Physical Activity: Not on file  Stress: Not on file  Social Connections: Unknown (03/26/2022)   [facility]   Social Network    Social Network: Not on file   Family History  Problem Relation Age of Onset   Learning disabilities Mother    Asthma Mother    Arthritis Mother    Irritable bowel syndrome Mother  Miscarriages / Stillbirths Mother    Hypertension Father    Early death Father    Drug abuse Father    Depression Father    Cancer Father    Arthritis Father    Alcohol abuse Father    Brain cancer Father    Anesthesia problems Father        hard to wake up post-op   Lung cancer Father    Hyperlipidemia Maternal Grandmother    Hypertension Maternal Grandmother    Cancer Maternal Grandmother    Asthma Maternal Grandmother    Arthritis Maternal Grandmother    Breast cancer Maternal Grandmother    Ovarian cancer Maternal Grandmother    Diabetes Maternal Grandmother    Heart disease Maternal Grandmother    Heart attack Maternal Grandmother    Hypertension Maternal Grandfather    Hyperlipidemia Maternal Grandfather    Arthritis Maternal Grandfather    Alcohol abuse Maternal Grandfather    Cancer Maternal Grandfather    Prostate cancer Maternal Grandfather    Melanoma Maternal Grandfather    Cancer - Prostate Maternal Grandfather    Hypertension Paternal Grandmother    COPD Paternal Grandmother    Arthritis Paternal Grandmother    Learning disabilities Daughter    Learning disabilities Son    Colon cancer Neg Hx    Allergies  Allergen Reactions   Nickel Rash    "any jewelry that's not real"/ rash and itching    Prochlorperazine  Other (See Comments)   Nsaids Other (See Comments)    DUE TO HISTORY OF GASTRIC ULCER   Tolmetin Itching and Other (See Comments)    DUE TO HISTORY OF GASTRIC ULCER  DUE TO HISTORY OF GASTRIC ULCER  DUE TO HISTORY OF GASTRIC ULCER, DUE TO HISTORY OF GASTRIC ULCER   Oxybutynin Other (See Comments)    "severe dry mouth"   Adhesive [Tape] Rash   Metoclopramide Hcl Other (See Comments)    RESTLESSNESS/JITTERY   Prochlorperazine Edisylate Other (See Comments)    RESTLESSNESS/JITTERY    Sulfa Antibiotics Rash   Current Outpatient Medications  Medication Sig Dispense Refill   ketoconazole (NIZORAL) 2 % cream Apply 1 Application topically daily. 15 g 0   ADDERALL XR 20 MG 24 hr capsule Take 20 mg by mouth every morning.  0   albuterol (PROVENTIL HFA;VENTOLIN HFA) 108 (90 Base) MCG/ACT inhaler Inhale 2 puffs into the lungs every 6 (six) hours as needed for wheezing or shortness of breath.      ALPRAZolam (XANAX) 0.25 MG tablet SMARTSIG:0.5 Tablet(s) By Mouth PRN     aspirin EC 81 MG tablet Take 1 tablet (81 mg total) by mouth daily. Swallow whole. 14 tablet 0   aspirin EC 81 MG tablet Take 1 tablet (81 mg total) by mouth daily. Swallow whole. 30 tablet 12   atorvastatin (LIPITOR) 40 MG tablet Take 1 tablet (40 mg total) by mouth daily. 15 tablet 0   Bacillus Coagulans-Inulin (PROBIOTIC) 1-250 BILLION-MG CAPS      cetirizine (ZYRTEC) 10 MG tablet Take by mouth.     EMGALITY 120 MG/ML SOAJ Inject 120 mg into the skin every 30 (thirty) days.  4   Fezolinetant 45 MG TABS Take 1 tablet (45 mg total) by mouth daily. Can be taken with or without food. (Patient not taking: Reported on 01/25/2024) 90 tablet 3   fluticasone (FLONASE) 50 MCG/ACT nasal spray Place into the nose.     fluticasone (FLONASE) 50 MCG/ACT nasal spray Place 2 sprays into both  nostrils daily. 16 g 6   gabapentin (NEURONTIN) 300 MG capsule TAKE 2 CAPSULES BY MOUTH 3 TIMES DAILY (Patient taking differently: Take 600-1,200 mg by  mouth See admin instructions. Take 600mg  by mouth in the morning and 1,200mg  in the evening) 180 capsule 4   LINZESS 290 MCG CAPS capsule Take 290 mcg by mouth daily. (Patient not taking: Reported on 01/11/2024)     loratadine (CLARITIN) 10 MG tablet Take 1 tablet (10 mg total) by mouth daily. (Patient not taking: Reported on 01/11/2024) 30 tablet 11   magnesium oxide (MAG-OX) 400 MG tablet Take by mouth.     methylPREDNISolone (MEDROL DOSEPAK) 4 MG TBPK tablet Take per packet instructions (Patient not taking: Reported on 01/25/2024) 21 each 0   mirtazapine (REMERON) 30 MG tablet Take 30 mg by mouth daily.     Multiple Vitamins-Minerals (EQL CENTURY WOMENS) TABS      omeprazole (PRILOSEC) 40 MG capsule Take 40 mg by mouth daily.     oxyCODONE (ROXICODONE) 5 MG immediate release tablet Take 1 tablet (5 mg total) by mouth every 4 (four) hours as needed for severe pain (pain score 7-10) or breakthrough pain. 10 tablet 0   Polyethylene Glycol 3350 (MIRALAX PO) Take by mouth at bedtime.     promethazine (PHENERGAN) 25 MG tablet Take 1 tablet (25 mg total) by mouth every 8 (eight) hours as needed for nausea or vomiting. 10 tablet 0   pseudoephedrine (SUDAFED 12 HOUR) 120 MG 12 hr tablet Take 1 tablet (120 mg total) by mouth 2 (two) times daily. 20 tablet 0   Rimegepant Sulfate (NURTEC) 75 MG TBDP Nurtec ODT 75 mg disintegrating tablet  TAKE ONE TABLET BY MOUTH AS NEEDED. one PER 24 hours     Saline (SIMPLY SALINE) 0.9 % AERS Place 2 each into the nose as directed. Use nightly for sinus hygiene long-term.  Can also be used as many times daily as desired to assist with clearing congested sinuses. (Patient not taking: Reported on 01/25/2024) 127 mL 11   senna-docusate (SENOKOT-S) 8.6-50 MG tablet Take 1 tablet by mouth daily. 10 tablet 0   tiZANidine (ZANAFLEX) 4 MG tablet Take 4 mg by mouth every 6 (six) hours as needed for muscle spasms.      traZODone (DESYREL) 50 MG tablet at bedtime as needed. Patient  reported that she takes half of a tablet.     UBRELVY 100 MG TABS Take by mouth. (Patient not taking: Reported on 01/25/2024)     venlafaxine XR (EFFEXOR-XR) 150 MG 24 hr capsule Take 2 capsules (300 mg total) by mouth daily before breakfast. 60 capsule 11   zonisamide (ZONEGRAN) 100 MG capsule Take 100 mg by mouth at bedtime.     No current facility-administered medications for this visit.   No results found.  Review of Systems:   A ROS was performed including pertinent positives and negatives as documented in the HPI.   Musculoskeletal Exam:    Last menstrual period 08/26/2018.  Right knee incisions are well-appearing without erythema or drainage.  Trace effusion.  Range of motion is from 0 to 120 degrees.  No joint line tenderness.  Imaging:      I personally reviewed and interpreted the radiographs.   Assessment:   2 weeks status post right knee arthroscopy with lateral femoral condyle abrasion arthroplasty and core decompression overall doing well.  At this time she will advance her weightbearing as tolerated.  Of advised that she should be in her brace  for 2 additional weeks.  I will plan to see her back in 4 weeks for reassessment  Plan :    -Return to clinic 4 weeks for reassessment      I personally saw and evaluated the patient, and participated in the management and treatment plan.  [name], MD Attending Physician, Orthopedic Surgery  This document was dictated using Dragon voice recognition software. A reasonable attempt at proof reading has been made to minimize errors.

## 2024-01-31 ENCOUNTER — Other Ambulatory Visit: Payer: Self-pay

## 2024-01-31 NOTE — Therapy (Signed)
 OUTPATIENT PHYSICAL THERAPY LOWER EXTREMITY TREATMENT   Patient Name: Diane [name] MRN: [id number] DOB:06-Jan-1981, 43 y.o., female Today's Date: 02/02/2024  END OF SESSION:  PT End of Session - 02/01/2024    Visit Number 2   Number of Visits 12   Date for PT Re-Evaluation 03/27/2024   Authorization Type [employer] 20 VL   PT Start Time  1153   PT Stop Time 1232   PT Time Calculation (min) 39 min   Activity Tolerance Patient tolerated treatment well   Behavior During Therapy  WFL for tasks assessed/performed      Past Medical History:  Diagnosis Date   ADD (attention deficit disorder with hyperactivity)    Allergy    Anxiety    Arthritis    Atypical chest pain 07/26/2021   Atypical chest pain     Benign intracranial hypertension 06/16/2008   Qualifier: Diagnosis of   By: Nira Conn         Choking    due to food and pills get stuck   Cholelithiasis and acute cholecystitis without obstruction 12/02/2016   Chronic cough 12/27/2013   Depression    Dry mouth 08/12/2017   Dysmenorrhea    Family history of adverse reaction to anesthesia    pt's father has hx. of being hard to wake up post-op   Gastric ulcer 03/16/2015   GERD (gastroesophageal reflux disease)    Headache 07/09/2007   Qualifier: Diagnosis of   By: Cato Mulligan MD, Bruce      Replacing diagnoses that were inactivated after the 02/16/23 regulatory import     History of gastric ulcer    History of kidney stones    Hyperlipidemia    IBS (irritable bowel syndrome)    Lower extremity edema 07/26/2021   Lower extremity edema     Migraines    neurologist-  dr c. Sharene Skeans ([facility] in [place])-- treated with Emgality injection every 30 days   Mild obstructive sleep apnea    per study in epic 03-21-2017 mild osa, recommendation cpap, mouth appliance, wt loss   Nausea and vomiting 12/03/2016   OAB (overactive bladder) 08/12/2017   Osteoarthritis    right ankle   Osteochondral lesion 09/2016   right  ankle   Osteomyelitis of knee region [facility])    Peripheral vascular disease (HCC)    NERVE DAMAGE RIGHT LEG AND FOOT DUE TO INJURGY.    Plantar fasciitis of left foot    Pleural effusion 12/15/2013   PONV (postoperative nausea and vomiting)    AND HEADACHES   Pseudotumor cerebri    at back head   RUQ pain    S/P dilatation of esophageal stricture 07/2018   Sinusitis 08/12/2018   RESOLVED WITH ANTIOBIOTIC   SUI (stress urinary incontinence, female)    Wears contact lenses    Wears partial dentures    upper   Past Surgical History:  Procedure Laterality Date   ANKLE ARTHROSCOPY Right 10/22/2016   Procedure: ANKLE ARTHROSCOPY;  Surgeon: Vivi Barrack, DPM;  Location:  SURGERY CENTER;  Service: Podiatry;  Laterality: Right;  GENERAL/REG BLOCK   ANKLE ARTHROSCOPY Right 09-15-2017   @[facility]    BLADDER SURGERY  child   x 2 - as a child to stretch bladder   CESAREAN SECTION  12/24/2009   CESAREAN SECTION  11/20/2011   Procedure: CESAREAN SECTION;  Surgeon: Lenoard Aden, MD;  Location: [facility];  Service: Gynecology;  Laterality: N/A;   CHOLECYSTECTOMY N/A 12/03/2016   Procedure:  LAPAROSCOPIC CHOLECYSTECTOMY WITH INTRAOPERATIVE CHOLANGIOGRAM;  Surgeon: Darnell Level, MD;  Location: [facility];  Service: General;  Laterality: N/A;   CHONDROPLASTY Right 01/12/2024   Procedure: RIGHT KNEE ARTHROSCOPY LATERAL FEMORAL CHONDROPLASTY;  Surgeon: [name], MD;  Location: [facility];  Service: Orthopedics;  Laterality: Right;   CYSTOSCOPY WITH RETROGRADE PYELOGRAM, URETEROSCOPY AND STENT PLACEMENT Left 08/10/2006   ESOPHAGOGASTRODUODENOSCOPY (EGD) WITH ESOPHAGEAL DILATION  07/2018   HARDWARE REMOVAL Right 07/01/2016   Procedure: HARDWARE REMOVAL;  Surgeon: Cammy Copa, MD;  Location: [facility];  Service: Orthopedics;  Laterality: Right;   HARVEST BONE GRAFT Right 01/12/2024   Procedure: RIGHT BONE MARROW ASPIRATE FROM ILIAC CREST DECOMPRESSION AND PLACEMENT RIGHT LATERAL  FEMORAL CONDYLE;  Surgeon: [name], MD;  Location: [facility];  Service: Orthopedics;  Laterality: Right;   KNEE ARTHROSCOPY WITH LATERAL RELEASE Right 12/13/2014   Procedure: KNEE ARTHROSCOPY WITH LATERAL RELEASE;  Surgeon: Thera Flake., MD;  Location: [facility];  Service: Orthopedics;  Laterality: Right;   LAPAROSCOPIC APPENDECTOMY N/A 06/11/2014   Procedure: APPENDECTOMY LAPAROSCOPIC;  Surgeon: Velora Heckler, MD;  Location: [facility];  Service: General;  Laterality: N/A;   ORIF ANKLE FRACTURE Right 01/05/2016   Procedure: OPEN REDUCTION INTERNAL FIXATION (ORIF) ANKLE FRACTURE;  Surgeon: Cammy Copa, MD;  Location: [facility];  Service: Orthopedics;  Laterality: Right;   OVARIAN CYST SURGERY Right 03/05/2007   AND CHROMOPERTUBATION  VIA LAPAROSCOPY   ROBOTIC ASSISTED LAPAROSCOPIC HYSTERECTOMY AND SALPINGECTOMY Bilateral 08/27/2018   Procedure: XI ROBOTIC ASSISTED LAPAROSCOPIC HYSTERECTOMY AND BILATERAL SALPINGECTOMY;  Surgeon: Olivia Mackie, MD;  Location: [facility];  Service: Gynecology;  Laterality: Bilateral;  [facility] for 23hr OBS   SYNOVECTOMY Right 12/13/2014   Procedure: PLICA SYNOVECTOMY;  Surgeon: Thera Flake., MD;  Location:  SURGERY CENTER;  Service: Orthopedics;  Laterality: Right;   TONSILLECTOMY AND ADENOIDECTOMY  child   WISDOM TOOTH EXTRACTION     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 01/12/2024   AVN (avascular necrosis of bone) (HCC) 01/12/2024   Prediabetes 07/14/2023   Weight disorder 07/14/2023   Ankle swelling 07/14/2023   Menopausal syndrome (hot flushes) 07/13/2023   Hyperlipidemia 06/19/2023   Intractable pain 06/17/2023   Chronic constipation 06/17/2023   Other idiopathic scoliosis, lumbar region 06/17/2023   Dysphagia 08/12/2017   Osteochondral lesion 06/03/2017   OSA (obstructive sleep apnea) 03/24/2017   Ankle pain, chronic 01/05/2016   Knee pain, right,  chronic 04/12/2015   Chronic migraine without aura 04/01/2013   DEPRESSION, ACUTE 10/02/2008   Opioid use disorder in remission 10/02/2008   back pain, chronic 03/28/2008   Obesity (BMI 30-39.9) 01/25/2008   Anxiety state 11/09/2007    PCP: Glenetta Hew MD  REFERRING PROVIDER: [name] MD  REFERRING DIAG: RIGHT KNEE ARTHROSCOPY LATERAL FEMORAL CHONDROPLASTYRightGeneral RIGHT BONE MARROW ASPIRATE FROM ILIAC CREST DECOMPRESSION AND PLACEMENT RIGHT LATERAL FEMORAL CONDYLE  THERAPY DIAG:  Stiffness of right knee, not elsewhere classified  Localized edema  Acute pain of right knee  Other abnormalities of gait and mobility  Rationale for Evaluation and Treatment: Rehabilitation  ONSET DATE: 01/12/2024  SUBJECTIVE:   SUBJECTIVE STATEMENT: Pt is 2 weeks and 6 days s/p Right knee arthroscopy with lateral femoral condyle chondroplasty, core decompression, sequestrectomy right knee lateral femoral condyle, and Right iliac crest bone marrow aspiration.  Pt states she is able to bend knee well though has difficulty straightening.  Pt is using bilat crutches with ambulation though uses a rollator at home.  Pt states she her leg is weak and feels that it wants to give out.  Pt denies any adverse effects after prior Rx.  Pt has been compliant with HEP.   Pt saw MD last week (on 3/12) and he removed stitches.  Pt had a rash at knee and hip and MD gave pt a cream.  Pt states the cream has helped a lot.  MD note indicated pt to be WBAT and she should be in her brace for 2 additional weeks.   Pt states she has some swelling today.  She has been doing more today.   PERTINENT HISTORY: MD note on 3/12 indicated pt to be WBAT and she should be in her brace for 2 additional weeks.   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  PAIN:  Are you having pain? Yes: NPRS scale:  5-6/10  /   8/10  Pain location: anterior R knee    /      lumbar  Pain description: shooting  Aggravating factors:standing and walking  Relieving factors: rest   PRECAUTIONS: None  RED FLAGS: None   WEIGHT BEARING RESTRICTIONS: Yes WBAT   FALLS:  Has patient fallen in last 6 months? Yes. Number of falls fallen a few times. Not sure why   LIVING ENVIRONMENT: Lives with:  At moms right now 3 into that house   10 steps at her house to get in and a second floor at home.   OCCUPATION:  Stay at home mom  Hobbies:  Working out  Walking     PLOF: Independent  PATIENT GOALS:  To be able to walk and work out    NEXT MD VISIT:  Wednesday next week.   OBJECTIVE:  Note: Objective measures were completed at Evaluation unless otherwise noted.  DIAGNOSTIC FINDINGS: nothing post op   PATIENT SURVEYS:  LEFS 55/80  COGNITION: Overall cognitive status: Within functional limits for tasks assessed     SENSATION: WFL  EDEMA:  Circumferential:     POSTURE: rounded shoulders and forward head  PALPATION: Significant spasming in bilateral lumbar paraspinals and gluteals.   LOWER EXTREMITY ROM:  Passive ROM Right eval Left eval Left 3/17  Hip flexion     Hip extension     Hip abduction     Hip adduction     Hip internal rotation     Hip external rotation     Knee flexion  86 111 AAROM  Knee extension  -5 0  Ankle dorsiflexion     Ankle plantarflexion     Ankle inversion     Ankle eversion      (Blank rows = not tested)  LOWER EXTREMITY MMT:  MMT Right eval Left eval  Hip flexion    Hip extension    Hip abduction    Hip adduction    Hip internal rotation    Hip external rotation    Knee flexion    Knee extension    Ankle dorsiflexion    Ankle plantarflexion    Ankle inversion    Ankle eversion     (Blank rows = not tested) not tested 2nd to recent surgery    GAIT: Walking with  minimal weight bearing and crutches. Patient reports she  uses a walkier at home                                                                                                                                TREATMENT DATE:  02/01/24 Reviewed current function, response to prior Rx, pain level, and HEP compliance.  OBSERVATION:  PT observed R hip and R knee.  Pt reports the rash is significantly improved.  She does have some healing spots/abrasions above the incisions/portals at her knee.  The rash on her hip has also improved and is dry.       Reviewed HEP. Pt received R knee flexion and extension PROM per pt and tissue tolerance. Assessed ROM  Standing weight shifts in brace with bilat UE support on counter s/s and f/b x 10 reps each Quad sets 2x10 with 5 sec hold Glute sets with 5 sec hold 2x10 Ankle pumps x 20  Supine Heel slides with strap x 10 reps    PATIENT EDUCATION:  Education details: HEP, symptom management, exercise form, relevant anatomy, ROM findings, POC, and PT answered pt's questions.  Person educated: Patient Education method: Explanation, Demonstration, Tactile cues, Verbal cues, and Handouts Education comprehension: verbalized understanding, returned demonstration, verbal cues required, tactile cues required, and needs further education  HOME EXERCISE PROGRAM: Access Code: [email or web address] URL: [email or web address] Date: 01/26/2024 Prepared by: Lorayne Bender  Updated HEP: - Supine Heel Slide with Strap  - 2 x daily - 7 x weekly - 1-2 sets - 10 reps  ASSESSMENT:  CLINICAL IMPRESSION: Pt presents to Rx with bilat crutches and knee brace unlocked from 0-90 deg.  PT observed incision sites and pt has much improved rash since using the cream from MD.  PT performed knee PROM and she tolerated PROM well.  Pt demonstrates improved flexion and extension ROM based upon goniometric measurements.  Pt performed ther ex well with cuing and instruction in correct  form.  Pt tolerates standing weight shifts well.  She reports compliance with HEP and PT updated HEP with supine heel slides today.  Pt responded well to Rx having no c/o's after Rx.  She should benefit from skilled PT to address impairments and goals and to assist in restoring desired level of function.      OBJECTIVE IMPAIRMENTS: Abnormal gait, decreased activity tolerance, decreased endurance, decreased knowledge of use of DME, decreased mobility, difficulty walking, decreased ROM, decreased strength, increased edema, and pain.   ACTIVITY LIMITATIONS: carrying, lifting, bending, standing, squatting, sleeping, stairs, transfers, dressing, locomotion level, and caring for others  PARTICIPATION LIMITATIONS: meal prep, cleaning, laundry, driving, shopping, community activity, and yard work  PERSONAL FACTORS: 1-2 comorbidities: right ankle ORIF/ Lumbar spine pain    are also affecting patient's functional outcome.   REHAB POTENTIAL: Good  CLINICAL DECISION MAKING: Evolving/moderate complexity lumbar spine pain decreasing overall mobility   EVALUATION COMPLEXITY: Low   GOALS: Goals reviewed with patient? Yes  SHORT TERM GOALS: Target date: 02/23/2024  Patient will increase passive right knee flexion to 120 degrees Baseline: Goal status: INITIAL  2.  Patient will report a 50% reduction in pain in her low back  Baseline:  Goal status: INITIAL  3.  Patient will progress off assistive device Baseline:  Goal status: INITIAL  4.  Patient will be independent with initial HEP  Baseline:  Goal status: INITIAL   LONG TERM GOALS: Target date: 03/22/2024    Patient will ambulate community distances without increased pain  Baseline:  Goal status: INITIAL  2.  Patient will go up/down 12 steps without increased pain in order to get to her bedroom  Baseline:  Goal status: INITIAL  3.  Patient will squat to pick item off the goound without pain  Baseline:  Goal status: INITIAL  4.   Patient will have a complete gym and home program  Baseline:  Goal status: INITIAL  PLAN:  PT FREQUENCY: 2x/week  PT DURATION: 8 weeks  PLANNED INTERVENTIONS: 97110-Therapeutic exercises, 97530- Therapeutic activity, [id number]- Neuromuscular re-education, 97535- Self Care, [id number]- Manual therapy, [id number]- Gait training, [phone]- Aquatic Therapy, [id number]- Electrical stimulation (unattended), 97035- Ultrasound, Patient/Family education, Stair training, Taping, Dry Needling, DME instructions, Cryotherapy, and Moist heat  PLAN FOR NEXT SESSION:  Progress per patient tolerance. Progress weight bearing next visit. Continue with ROM as tolerated.  STM to lumbar spine. Patient will have an MRI of her spine soon.    Audie Clear III PT, DPT 02/02/24 8:31 PM

## 2024-02-01 ENCOUNTER — Ambulatory Visit (HOSPITAL_BASED_OUTPATIENT_CLINIC_OR_DEPARTMENT_OTHER): Payer: Self-pay | Admitting: Physical Therapy

## 2024-02-01 ENCOUNTER — Encounter: Payer: Self-pay | Admitting: Physical Medicine and Rehabilitation

## 2024-02-01 DIAGNOSIS — R600 Localized edema: Secondary | ICD-10-CM

## 2024-02-01 DIAGNOSIS — M25661 Stiffness of right knee, not elsewhere classified: Secondary | ICD-10-CM

## 2024-02-01 DIAGNOSIS — M25561 Pain in right knee: Secondary | ICD-10-CM

## 2024-02-01 DIAGNOSIS — R2689 Other abnormalities of gait and mobility: Secondary | ICD-10-CM

## 2024-02-02 ENCOUNTER — Ambulatory Visit: Admitting: Internal Medicine

## 2024-02-02 ENCOUNTER — Encounter: Payer: Self-pay | Admitting: Internal Medicine

## 2024-02-02 ENCOUNTER — Encounter (HOSPITAL_BASED_OUTPATIENT_CLINIC_OR_DEPARTMENT_OTHER): Payer: Self-pay | Admitting: Physical Therapy

## 2024-02-02 VITALS — BP 114/73 | HR 112 | Temp 98.5°F | Ht 67.0 in | Wt 252.0 lb

## 2024-02-02 DIAGNOSIS — Z9189 Other specified personal risk factors, not elsewhere classified: Secondary | ICD-10-CM | POA: Diagnosis not present

## 2024-02-02 DIAGNOSIS — E669 Obesity, unspecified: Secondary | ICD-10-CM

## 2024-02-02 DIAGNOSIS — M25561 Pain in right knee: Secondary | ICD-10-CM

## 2024-02-02 DIAGNOSIS — E782 Mixed hyperlipidemia: Secondary | ICD-10-CM

## 2024-02-02 DIAGNOSIS — G4733 Obstructive sleep apnea (adult) (pediatric): Secondary | ICD-10-CM | POA: Diagnosis not present

## 2024-02-02 DIAGNOSIS — N951 Menopausal and female climacteric states: Secondary | ICD-10-CM | POA: Diagnosis not present

## 2024-02-02 DIAGNOSIS — G8929 Other chronic pain: Secondary | ICD-10-CM

## 2024-02-02 DIAGNOSIS — M545 Low back pain, unspecified: Secondary | ICD-10-CM

## 2024-02-02 DIAGNOSIS — F32A Depression, unspecified: Secondary | ICD-10-CM

## 2024-02-02 MED ORDER — TIRZEPATIDE-WEIGHT MANAGEMENT 2.5 MG/0.5ML ~~LOC~~ SOAJ: 2.5000 mg | SUBCUTANEOUS | 11 refills | Status: DC

## 2024-02-02 MED ORDER — ESTRADIOL 0.05 MG/24HR TD PTTW: 1.0000 | MEDICATED_PATCH | TRANSDERMAL | 12 refills | Status: DC

## 2024-02-02 MED ORDER — VEOZAH 45 MG PO TABS
1.0000 | ORAL_TABLET | Freq: Every day | ORAL | 11 refills | Status: DC
Start: 2024-02-02 — End: 2024-05-30

## 2024-02-02 NOTE — Progress Notes (Signed)
 ==============================  [facility]: [phone]   -- Medical Office Visit --  Patient: Diane [name]      Age: 43 y.o.       Sex:  female  Date:   02/02/2024 Today's Healthcare Provider: Lula Olszewski, MD  ==============================    CHIEF COMPLAINT: Discuss hot flashes   Background This is a 43 y.o. 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  **Discussed the use of AI scribe software for clinical note transcription with the patient, who gave verbal consent to proceed.  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 She is accompanied by her mother today. Patient underwent partial hysterectomy in 2019, which led to procedural induced menopause. Since then, she has been experiencing worsening hot flashes accompanied by significant night sweats. She reports waking up with soaking wet hair and a wet back, which substantially impacts her quality of life and sleep. Patient also experiences facial flushing that makes it difficult for her to apply makeup without a fan nearby, a symptom her daughter has also noticed. She has tried over-the-counter treatments like Estroven without improvement in her symptoms. In addition to her menopausal symptoms, patient has a history of falls due to knee buckling, which previously required a knee immobilizer before her surgery.  She recalls a particularly distressing incident where she fell in a movie theater during her daughter's birthday, hitting her head and feeling embarrassed. She is currently engaged in physical therapy for knee instability with a plan of twice weekly sessions for eight weeks, though insurance limitations have reduced this to once weekly. She performs prescribed exercises at home to improve mobility. Patient has a history of depression, which she reports is currently well-managed with medication. She denies any current issues with depression. Family history is notable for her mother having breast cancer in her early sixties and ovarian cancer later in life. Her maternal grandmother died at a young age from cerebral hemorrhage. Father had small cell lung cancer. No other known family history of breast or ovarian cancer. Patient has a history of substance use disorder, now in remission.  She currently vapes nicotine and acknowledges the associated health risks, especially given her father's history of small cell lung cancer. Recent MRI of the right knee (January 2025) showed a full-thickness chondral defect involving the posterior weight-bearing lateral femoral condyle with severe subchondral marrow edema, as well as focal shallow cartilage fissuring of the medial patellar facet and small full-thickness chondral defect of the superior trochlear groove. Recent MRI of the lumbar spine (February 2025) showed mild disc degeneration at L3-L4 and L4-L5 with borderline to mild bilateral L4 nerve level foraminal stenosis. CT head without contrast from January 2025 following a fall with loss of consciousness was negative.  Visually reviewed that patient  has a past medical history of ADD (attention deficit disorder with hyperactivity), Allergy, Anxiety, Arthritis, Atypical chest pain (07/26/2021), Benign intracranial hypertension (06/16/2008), Choking, Cholelithiasis and acute cholecystitis without obstruction  (12/02/2016), Chronic cough (12/27/2013), Depression, Dry mouth (08/12/2017), Dysmenorrhea, Family history of adverse reaction to  anesthesia, Gastric ulcer (03/16/2015), GERD (gastroesophageal reflux disease), Headache (07/09/2007), History of gastric ulcer, History of kidney stones, Hyperlipidemia, IBS (irritable bowel syndrome), Lower extremity edema (07/26/2021), Migraines, Mild obstructive sleep apnea, Nausea and vomiting (12/03/2016), OAB (overactive bladder) (08/12/2017), Osteoarthritis, Osteochondral lesion (09/2016), 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 Manually updated: No problems updated. Verbally reviewed (per Abridge-generated extraction):   Visually reviewed and manually updated: Current Outpatient Medications on File Prior to Visit  Medication Sig   ADDERALL XR 20 MG 24 hr capsule Take 20 mg by mouth every morning.   albuterol (PROVENTIL HFA;VENTOLIN HFA) 108 (90 Base) MCG/ACT inhaler Inhale 2 puffs into the lungs every 6 (six) hours as needed for wheezing or shortness of breath.    ALPRAZolam (XANAX) 0.25 MG tablet SMARTSIG:0.5 Tablet(s) By Mouth PRN   aspirin EC 81 MG tablet Take 1 tablet (81 mg total) by mouth daily. Swallow whole.   atorvastatin (LIPITOR) 40 MG tablet Take 1 tablet (40 mg total) by mouth daily.   Bacillus Coagulans-Inulin (PROBIOTIC) 1-250 BILLION-MG CAPS    cetirizine (ZYRTEC) 10 MG tablet Take by mouth.   diclofenac Sodium (VOLTAREN) 1 % GEL APPLY TO AFFECTED AREA AS INSTRUCTED   EMGALITY 120 MG/ML SOAJ Inject 120 mg into the skin every 30 (thirty) days.   fluticasone (FLONASE) 50 MCG/ACT nasal spray Place 2 sprays into both  nostrils daily.   fluticasone (FLOVENT HFA) 110 MCG/ACT inhaler Inhale into the lungs.   gabapentin (NEURONTIN) 300 MG capsule TAKE 2 CAPSULES BY MOUTH 3 TIMES DAILY (Patient taking differently: Take 600-1,200 mg by mouth See admin instructions. Take 600mg  by mouth in the morning and 1,200mg  in the evening)   ketoconazole (NIZORAL) 2 % cream Apply 1 Application topically daily.   lubiprostone (AMITIZA) 24 MCG capsule Take 24 mcg by mouth 2 (two) times daily.   magnesium oxide (MAG-OX) 400 MG tablet Take by mouth.   mirtazapine (REMERON) 30 MG tablet Take 30 mg by mouth daily.   Multiple Vitamins-Minerals (EQL CENTURY WOMENS) TABS    NUCYNTA 100 MG TABS Take 1 tablet by mouth every 8 (eight) hours as needed.   omeprazole (PRILOSEC) 40 MG capsule Take 40 mg by mouth daily.   ondansetron (ZOFRAN-ODT) 8 MG disintegrating tablet Take 1 tablet by mouth every 8 (eight) hours as needed.   pantoprazole (PROTONIX) 20 MG tablet Take 20 mg by mouth 2 (two) times daily.   promethazine (PHENERGAN) 25 MG tablet Take 1 tablet (25 mg total) by mouth every 8 (eight) hours as needed for nausea or vomiting.   Rimegepant Sulfate (NURTEC) 75 MG TBDP Nurtec ODT 75 mg disintegrating tablet  TAKE ONE TABLET BY MOUTH AS NEEDED. one PER 24 hours   Saline (SIMPLY SALINE) 0.9 % AERS Place 2 each into the nose as directed. Use nightly for sinus hygiene long-term.  Can also be used as many times daily as desired to assist with clearing congested sinuses.   senna-docusate (SENOKOT-S) 8.6-50 MG tablet Take 1 tablet by mouth daily.   tiZANidine (ZANAFLEX) 4 MG tablet Take 4 mg by mouth every 6 (six) hours as needed for muscle spasms.    UBRELVY 100 MG TABS Take by mouth.   venlafaxine XR (EFFEXOR-XR) 150 MG 24 hr capsule Take 2 capsules (300 mg  total) by mouth daily before breakfast.   zonisamide (ZONEGRAN) 100 MG capsule Take 100 mg by mouth at bedtime.   No current facility-administered medications on file prior to visit.    Medications Discontinued During This Encounter  Medication Reason   traZODone (DESYREL) 50 MG tablet    Fezolinetant 45 MG TABS    aspirin EC 81 MG tablet    oxyCODONE (ROXICODONE) 5 MG immediate release tablet    methylPREDNISolone (MEDROL DOSEPAK) 4 MG TBPK tablet             02/02/2024   10:33 AM 01/25/2024   10:03 AM 01/12/2024   10:49 AM  Vitals with BMI  Height 5\' 7"     Weight 252 lbs    BMI 39.46    Systolic 114 138 284  Diastolic 73 95 78  Pulse 112 123 82   Wt Readings from Last 10 Encounters:  02/02/24 252 lb (114.3 kg)  01/12/24 245 lb 9.5 oz (111.4 kg)  11/21/23 233 lb 0.4 oz (105.7 kg)  09/09/23 233 lb (105.7 kg)  07/13/23 231 lb 3.2 oz (104.9 kg)  06/20/23 224 lb 13.9 oz (102 kg)  06/17/23 224 lb 12.8 oz (102 kg)  09/01/22 194 lb 0.1 oz (88 kg)  07/26/21 194 lb (88 kg)  08/10/19 200 lb (90.7 kg)   Vital signs reviewed.  Nursing notes reviewed. Weight trend reviewed. Physical Exam  Physical Exam Abnormalities and Problem-Specific physical exam findings:  truncal adiposity , wearing R knee stabilizer at 45 degrees while sitting,  General Appearance:  No acute distress appreciable.   Well-groomed, healthy-appearing female.  Well proportioned with no abnormal fat distribution.  Good muscle tone. Pulmonary:  Normal work of breathing at rest, no respiratory distress apparent. SpO2: 96 %  Musculoskeletal: All extremities are intact.  Neurological:  Awake, alert, oriented, and engaged.  No obvious focal neurological deficits or cognitive impairments.  Sensorium seems unclouded.   Speech is clear and coherent with logical content. Psychiatric:  Appropriate mood, pleasant and cooperative demeanor, thoughtful and engaged during the exam     No results found for any visits on 02/02/24. Office Visit on 09/09/2023  Component Date Value   Rapid Strep A Screen 09/09/2023 Positive (A)    Influenza A, POC 09/09/2023 Negative    Influenza B, POC 09/09/2023 Negative     SARS Coronavirus 2 Ag 09/09/2023 Negative   Office Visit on 07/13/2023  Component Date Value   Hemoglobin A1C 07/13/2023 5.8 (A)   Admission on 06/20/2023, Discharged on 06/20/2023  Component Date Value   Lipase 06/20/2023 26    Sodium 06/20/2023 138    Potassium 06/20/2023 3.7    Chloride 06/20/2023 103    CO2 06/20/2023 27    Glucose, Bld 06/20/2023 119 (H)    BUN 06/20/2023 10    Creatinine, Ser 06/20/2023 0.75    Calcium 06/20/2023 9.4    Total Protein 06/20/2023 7.3    Albumin 06/20/2023 4.0    AST 06/20/2023 28    ALT 06/20/2023 23    Alkaline Phosphatase 06/20/2023 108    Total Bilirubin 06/20/2023 0.3    GFR, Estimated 06/20/2023 >60    Anion gap 06/20/2023 8    WBC 06/20/2023 4.8    RBC 06/20/2023 4.20    Hemoglobin 06/20/2023 11.6 (L)    HCT 06/20/2023 35.3 (L)    MCV 06/20/2023 84.0    MCH 06/20/2023 27.6    MCHC 06/20/2023 32.9    RDW 06/20/2023 13.8  Platelets 06/20/2023 263    nRBC 06/20/2023 0.0    Color, Urine 06/20/2023 YELLOW    APPearance 06/20/2023 CLEAR    Specific Gravity, Urine 06/20/2023 >1.046 (H)    pH 06/20/2023 7.5    Glucose, UA 06/20/2023 NEGATIVE    Hgb urine dipstick 06/20/2023 NEGATIVE    Bilirubin Urine 06/20/2023 NEGATIVE    Ketones, ur 06/20/2023 NEGATIVE    Protein, ur 06/20/2023 TRACE (A)    Nitrite 06/20/2023 NEGATIVE    Leukocytes,Ua 06/20/2023 NEGATIVE   Office Visit on 06/17/2023  Component Date Value   Hepatitis C Ab 06/17/2023 NON-REACTIVE    WBC 06/17/2023 6.0    RBC 06/17/2023 4.50    Platelets 06/17/2023 300.0    Hemoglobin 06/17/2023 12.3    HCT 06/17/2023 37.6    MCV 06/17/2023 83.7    MCHC 06/17/2023 32.6    RDW 06/17/2023 14.1    Sodium 06/17/2023 138    Potassium 06/17/2023 4.3    Chloride 06/17/2023 104    CO2 06/17/2023 26    Glucose, Bld 06/17/2023 89    BUN 06/17/2023 13    Creatinine, Ser 06/17/2023 0.84    Total Bilirubin 06/17/2023 0.3    Alkaline Phosphatase 06/17/2023 119 (H)    AST  06/17/2023 30    ALT 06/17/2023 27    Total Protein 06/17/2023 7.8    Albumin 06/17/2023 4.3    GFR 06/17/2023 86.68    Calcium 06/17/2023 9.4    Cholesterol 06/17/2023 157    Triglycerides 06/17/2023 184.0 (H)    HDL 06/17/2023 37.20 (L)    VLDL 06/17/2023 36.8    LDL Cholesterol 06/17/2023 83    Total CHOL/HDL Ratio 06/17/2023 4    NonHDL 06/17/2023 120.15    HIV 1&2 Ab, 4th Generati* 06/17/2023 NON-REACTIVE    TSH 06/17/2023 1.62   No image results found. DG Knee 1-2 Views Right Result Date: 01/29/2024 CLINICAL DATA:  [id number] Elective surgery [id number] EXAM: RIGHT KNEE - 1-2 VIEW COMPARISON:  None Available. FINDINGS: Two fluoroscopic spot views of the knees submitted from the operating room. Air projects over the joint space consistent with recent surgery. Fluoroscopy time 39 seconds. Dose 2.098 mGy. IMPRESSION: Intraoperative fluoroscopy during knee surgery. Electronically Signed   By: Narda Rutherford M.D.   On: 01/29/2024 16:22   DG C-Arm 1-60 Min-No Report Result Date: 01/12/2024 Fluoroscopy was utilized by the requesting physician.  No radiographic interpretation.   MR Lumbar Spine Wo Contrast Result Date: 01/08/2024 CLINICAL DATA:  43 year old female with low back pain radiating down the right leg. EXAM: MRI LUMBAR SPINE WITHOUT CONTRAST TECHNIQUE: Multiplanar, multisequence MR imaging of the lumbar spine was performed. No intravenous contrast was administered. COMPARISON:  Lumbar radiographs 06/17/2023. FINDINGS: Segmentation:  Normal on the comparison.  Hypoplastic ribs at T12. Alignment: Normal lumbar lordosis with subtle retrolisthesis of L3 on L4. Vertebrae: Normal vertebral body height. Normal background bone marrow signal. Intact visible sacrum and SI joints. No marrow edema or evidence of acute osseous abnormality. Conus medullaris and cauda equina: Conus extends to the T12-L1 level. No lower spinal cord or conus signal abnormality. Capacious spinal canal. Normal cauda equina  nerve roots. Paraspinal and other soft tissues: Negative. Disc levels: T11-T12: Negative. T12-L1:  Negative. L1-L2:  Negative. L2-L3:  Negative. L3-L4: Disc desiccation. Subtle disc space loss and disc bulging asymmetric to the left. No stenosis. L4-L5: Disc desiccation. Subtle disc space loss and circumferential disc bulging. Mild facet and ligament flavum hypertrophy. No spinal or lateral recess stenosis. Borderline to  mild bilateral L4 neural foraminal stenosis. L5-S1:  Negative. IMPRESSION: 1. Mild disc degeneration at L3-L4 and L4-L5. Borderline to mild bilateral L4 nerve level foraminal stenosis. 2. Elsewhere normal MRI appearance of the Lumbar Spine. Electronically Signed   By: Odessa Fleming M.D.   On: 01/08/2024 11:42   MR KNEE RIGHT WO CONTRAST Result Date: 12/11/2023 CLINICAL DATA:  Right knee pain, prior surgery in 2000 EXAM: MRI OF THE RIGHT KNEE WITHOUT CONTRAST TECHNIQUE: Multiplanar, multisequence MR imaging of the knee was performed. No intravenous contrast was administered. COMPARISON:  None Available. FINDINGS: MENISCI Medial: Intact. Lateral: Intact. LIGAMENTS Cruciates: Intact ACL with mucinous degeneration. 10 mm ACL cyst along the posterior origin. Intact PCL. Collaterals: Medial collateral ligament is intact. Lateral collateral ligament complex is intact. CARTILAGE Patellofemoral: Focal shallow cartilage fissuring of the medial patellar facet. Small shallow cartilage fissure of the lateral patellar facet. Small full-thickness chondral defect involving the superior trochlear groove measuring 5 mm craniocaudal. Medial:  No chondral defect. Lateral: Full-thickness chondral defect involving the posterior weight-bearing lateral femoral condyle measuring 5 mm medial-lateral and 6 mm AP with severe subchondral marrow edema. JOINT: Trace joint effusion. Normal Hoffa's fat-pad. No plical thickening. POPLITEAL FOSSA: Popliteus tendon is intact. No Baker's cyst. EXTENSOR MECHANISM: Intact quadriceps tendon.  Intact patellar tendon. Intact lateral patellar retinaculum. Intact medial patellar retinaculum. Intact MPFL. BONES: No aggressive osseous lesion. No fracture or dislocation. Small bone infarct in the posterior aspect of the lateral femoral condyle. Other: No fluid collection or hematoma. Muscles are normal. IMPRESSION: 1. No meniscal or ligamentous injury of the right knee. 2. Focal shallow cartilage fissuring of the medial patellar facet. Small shallow cartilage fissure of the lateral patellar facet. Small full-thickness chondral defect involving the superior trochlear groove measuring 5 mm craniocaudal. 3. Full-thickness chondral defect involving the posterior weight-bearing lateral femoral condyle measuring 5 mm medial-lateral and 6 mm AP with severe subchondral marrow edema. 4. Intact ACL with mucinous degeneration. 10 mm ACL cyst along the posterior origin. Electronically Signed   By: Elige Ko M.D.   On: 12/11/2023 06:09   DG Wrist Complete Left Result Date: 11/21/2023 CLINICAL DATA:  Status post fall.  Left wrist pain. EXAM: LEFT WRIST - COMPLETE 3+ VIEW COMPARISON:  None Available. FINDINGS: There is no evidence of fracture or dislocation. There is no evidence of arthropathy or other focal bone abnormality. Soft tissues are unremarkable. IMPRESSION: Negative. Electronically Signed   By: Signa Kell M.D.   On: 11/21/2023 15:19   DG Knee Complete 4 Views Left Result Date: 11/21/2023 CLINICAL DATA:  Status post fall. EXAM: LEFT KNEE - COMPLETE 4+ VIEW COMPARISON:  None Available. FINDINGS: No joint effusion. No signs of acute fracture or dislocation. No significant arthropathy. Soft tissues appear within normal limits. IMPRESSION: Negative. Electronically Signed   By: Signa Kell M.D.   On: 11/21/2023 15:17   CT Head Wo Contrast Result Date: 11/21/2023 CLINICAL DATA:  Head trauma, GCS=15, no focal neuro findings (low risk). Fall with loss of consciousness. EXAM: CT HEAD WITHOUT CONTRAST  TECHNIQUE: Contiguous axial images were obtained from the base of the skull through the vertex without intravenous contrast. RADIATION DOSE REDUCTION: This exam was performed according to the departmental dose-optimization program which includes automated exposure control, adjustment of the mA and/or kV according to patient size and/or use of iterative reconstruction technique. COMPARISON:  Head MRI 04/13/2008 FINDINGS: Brain: There is no evidence of an acute infarct, intracranial hemorrhage, mass, midline shift, or extra-axial fluid collection. The  ventricles and sulci are normal. Vascular: No hyperdense vessel. Skull: No acute fracture or suspicious osseous lesion. Sinuses/Orbits: Visualized paranasal sinuses and mastoid air cells are clear. Unremarkable orbits. Other: None. IMPRESSION: Negative head CT. Electronically Signed   By: Sebastian Ache M.D.   On: 11/21/2023 14:47          Assessment & Plan Menopausal syndrome (hot flushes)     Menopausal syndrome with hot flashes (N95.1) Patient experiencing severe hot flashes, night sweats, mood changes, and fatigue since hysterectomy in 2019, likely due to surgical menopause. Symptoms significantly impact daily activities and quality of life, including disrupted sleep and inability to perform routine tasks like applying makeup without overheating. Prior treatments with OTC remedies Jeffie Pollock) have been ineffective. Family history notable for maternal breast cancer (60s) and ovarian cancer, raising concern for hormone-sensitive malignancies. Discussed risks of hormone replacement therapy including increased risk of stroke and breast cancer, particularly given family history. Also reviewed potential benefits of estrogen replacement including improved vasomotor symptoms, reduced osteoporosis risk, and improved skin health. Obtained informed consent after discussing risks, benefits, and alternatives. PLAN: Initiate transdermal estradiol (Vivelle-Dot) 0.05 mg/24hr  patch, 1 patch twice weekly Order perimenopausal lab set to evaluate hormone levels Submit prior authorization/appeal for Fezolinetant (Veozah) 45 mg daily as a non-hormonal alternative for hot flash management Recommended lifestyle modifications (layered clothing, avoiding triggers, cooling techniques) Discussed alternative treatments including acupuncture and herbal remedies as adjunctive options   OSA (obstructive sleep apnea)   OSA (obstructive sleep apnea) (G47.33) Diagnosed with mild obstructive sleep apnea. Weight management will be important for symptom control.   PLAN: Continue current OSA management Weight loss with GLP-1 agonist therapy may improve symptoms and is FDA approved Reassess need for sleep study after significant weight loss   Obesity (BMI 30-39.9)   Weight disorder/Obesity (E66.9) BMI currently 39.46 (252 lbs), showing gradual weight gain over past year (from 231 lbs in August 2024). Weight contributing to other health issues including OSA and musculoskeletal problems. 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 Discussed potential use of Zepbound (tirzepatide), a GLP-1 receptor agonist, for weight management. Reviewed mechanism of action, expected benefits, and potential side effects. Obtained informed consent after discussing benefits, risks, and insurance considerations. PLAN: Submit prior authorization for tirzepatide (Zepbound) 2.5 mg subcutaneous weekly Consider Wegovy as alternative if Zepbound not approved Discussed importance of continuing dietary modifications and physical activity   At high risk for osteoporosis   Osteoporosis risk due to early menopause (Z82.62) Patient at elevated risk for osteoporosis due to early surgical menopause. Risk further increased by family history and limited weight-bearing exercise due to knee issues. Discussed importance of preventive measures including hormone therapy consideration. PLAN: Recommended calcium  supplementation 600 mg twice daily Continue vitamin D supplementation Estrogen therapy (as noted above) will also provide bone protection Consider baseline bone density scan at next visit   Knee pain, right, chronic     Knee pain, right, chronic (M25.561) History of falls due to knee buckling. 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 Currently in physical therapy for knee instability, reduced from twice weekly to once weekly due to insurance limitations. Performing home exercises for mobility. PLAN: Continue prescribed physical therapy Maintain home exercise program Weight management will help reduce mechanical stress on joint Follow up with orthopedics as scheduled   Chronic low back pain, unspecified back pain laterality, unspecified whether sciatica present     Back pain, chronic (M54.5) Recent MRI lumbar  spine (February 2025) shows mild disc degeneration at L3-L4 and L4-L5 with borderline to mild bilateral L4 nerve level foraminal stenosis. PLAN: Continue current pain management regimen Physical therapy for core strengthening when knee rehabilitation allows Weight management as noted above will help reduce axial loading   Depressive disorder   Depression (F32.9) History of depression, currently well-managed with medication (venlafaxine XR 300 mg daily, mirtazapine 30 mg daily). Denies current depressive symptoms. PLAN: Continue current antidepressant regimen Monitor for mood changes with initiation of HRT Follow up as needed   Mixed hyperlipidemia     Hyperlipidemia (E78.5) Currently on atorvastatin 40 mg daily. Recent lipids (July 2024): total cholesterol 157, triglycerides 184 (elevated), HDL 37.2 (low), LDL 83. Family history of genetic vascular disease. PLAN: Continue atorvastatin 40 mg daily Recheck lipid panel in 3-6 months   Assessment and Plan Assessment & Plan Menopausal syndrome with  hot flashes   She experiences severe hot flashes, night sweats, mood changes, and fatigue since her hysterectomy in 2019, likely due to surgical menopause. Her family history of breast and ovarian cancer raises concerns about hormone replacement therapy (HRT). Discussed the risks of HRT, including increased risk of stroke and breast cancer, especially given her family history. Estrogen replacement therapy was the standard of care but carries risks, particularly if BRCA positive. Discussed alternative treatments and the potential benefits of estrogen, such as improved skin health and reduced osteoporosis risk. Informed consent obtained, discussing risks, benefits, and alternatives, including the use of Veozah, a non-hormonal option for hot flashes, and lifestyle modifications. Order transdermal estrogen patch twice a week with 12 refills for one year. Order perimenopausal lab set to check hormone levels. Submit appeal for [employer]  to insurance for hot flash management. Discuss lifestyle modifications and alternative treatments such as acupuncture and herbals.  Weight disorder   She struggles with weight management and sleep apnea. Discussed potential use of Zepbound, a weight loss medication, which may also help with sleep apnea. 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 Informed consent obtained, discussing the potential benefits and insurance challenges. Submit prior authorization for Zepbound for weight management and sleep apnea. Consider Wegovy if Zepbound is not approved.  Osteoporosis risk due to early menopause   She is at risk for osteoporosis due to early menopause. Emphasized importance of calcium and vitamin D supplementation to mitigate this risk. Recommend calcium 600 mg twice daily and continue vitamin D supplementation.  Recording duration: 46 minutes, but I have personally spent 65 minutes involved in face-to-face and non-face-to-face activities for this patient on the day of the  visit. Professional time spent includes the following activities: Preparing to see the patient (review of tests), Obtaining and/or reviewing separately obtained history (admission/discharge record), Performing a medically appropriate examination and/or evaluation , Ordering medications/tests/procedures, referring and communicating with other health care professionals, Documenting clinical information in the EMR, Independently interpreting results (not separately reported), Communicating results to the patient/family/caregiver, Counseling and educating the patient/family/caregiver and Care coordination (not separately reported).    Most of the time after talking to the patient was spent writing comprehensive appeal letters for Veozah and Zepbound- see letters attached.       Orders Placed During this Encounter:   Orders Placed This Encounter  Procedures   Alb+Prl+FSH+LH+Prog+DHEA+Es...   Meds ordered this encounter  Medications   tirzepatide (ZEPBOUND) 2.5 MG/0.5ML Pen    Sig: Inject 2.5 mg into the skin once a week.    Dispense:  2 mL    Refill:  11  estradiol (VIVELLE-DOT) 0.05 MG/24HR patch    Sig: Place 1 patch (0.05 mg total) onto the skin 2 (two) times a week.    Dispense:  8 patch    Refill:  12   Fezolinetant (VEOZAH) 45 MG TABS    Sig: Take 1 tablet (45 mg total) by mouth daily at 6 (six) AM.    Dispense:  30 tablet    Refill:  11    Ok to convert to 90 tab 3 refill if preferred Ok to use savings card if insurance refuses             **This document was synthesized by artificial intelligence (Abridge) using HIPAA-compliant recording of the clinical interaction;   We discussed the use of AI scribe software for clinical note transcription with the patient, who gave verbal consent to proceed.    Additional Info: This encounter employed state-of-the-art, real-time, collaborative documentation. The patient actively reviewed and assisted in updating their electronic medical  record on a shared screen, ensuring transparency and facilitating joint problem-solving for the problem list, overview, and plan. This approach promotes accurate, informed care. The treatment plan was discussed and reviewed in detail, including medication safety, potential side effects, and all patient questions. We confirmed understanding and comfort with the plan. Follow-up instructions were established, including contacting the office for any concerns, returning if symptoms worsen, persist, or new symptoms develop, and precautions for potential emergency department visits.

## 2024-02-02 NOTE — Patient Instructions (Addendum)
 TODAY'S VISIT SUMMARY  Today we addressed your worsening hot flashes and other symptoms related to surgical menopause. We also reviewed your weight management challenges and the risk of osteoporosis due to early menopause. We discussed various treatment options and developed a comprehensive plan to address these issues. YOUR PLAN    MENOPAUSAL SYNDROME WITH HOT FLASHES Menopausal syndrome occurs when the body undergoes changes due to reduced hormone levels, often causing symptoms like hot flashes and night sweats. After discussing the risks and benefits of hormone replacement therapy, especially considering your family history of breast and ovarian cancer, we have decided to start you on: Medications Estradiol patch (Vivelle-Dot): Apply one patch to clean, dry skin on your lower abdomen or buttock twice weekly (every 3-4 days). Change on the same days each week (for example, Monday and Thursday). Veozah: We will submit an appeal to your insurance for this non-hormonal medication that can help manage hot flashes.  Testing Complete the hormone panel we ordered to check your baseline levels.  Important Precautions Monitor for any unusual vaginal bleeding, breast changes, severe headaches, chest pain, calf pain/swelling, or vision changes. These could indicate serious side effects and should be reported immediately.   WEIGHT MANAGEMENT To address your weight concerns and potentially improve your sleep apnea: Medications Zepbound (tirzepatide): We've submitted a prior authorization for this medication. If approved, you'll inject 2.5 mg under the skin once weekly. We'll provide detailed instructions when the medication is approved. Alternative plan: If Zepbound is not approved, we may consider Wegovy as an alternative.  Potential Side Effects Nausea, vomiting, diarrhea, and decreased appetite are common, especially when starting. These typically improve over time.   OSTEOPOROSIS PREVENTION Early menopause  increases your risk of osteoporosis (bone thinning). To help protect your bones: Supplements Calcium: Take 600 mg twice daily with meals. Vitamin D: Continue your current supplementation.  Exercise As your knee condition allows, gradually incorporate gentle weight-bearing activities.    SELF-CARE RECOMMENDATIONS    FOR HOT FLASH MANAGEMENT Dress in layers that can be removed when a hot flash begins Keep a small portable fan with you Avoid hot flash triggers: spicy foods, alcohol, caffeine, and hot beverages Keep your bedroom cool at night Use cooling pillows or moisture-wicking sleepwear Practice deep, slow breathing when a hot flash begins FOR WEIGHT MANAGEMENT Continue to focus on protein-rich, nutrient-dense foods Stay well-hydrated with water throughout the day Be prepared for potential reduced appetite with medication Continue physical activity as tolerated, respecting knee limitations FOR KNEE CARE Continue your physical therapy exercises at home as prescribed Use proper body mechanics when lifting or carrying items Wear supportive footwear Apply ice after activity if needed for pain control MEDICATIONS    NEW MEDICATIONS: Medication Instructions  Estradiol (Vivelle-Dot) 0.05 mg/24hr patch Apply 1 patch to skin twice weekly  Tirzepatide (Zepbound) 2.5 mg/0.19mL Inject 2.5 mg under the skin once weekly (pending approval)  Fezolinetant (Veozah) 45 mg Take 1 tablet by mouth daily at 6 AM (pending approval)  Continue your current medications as prescribed. FOLLOW-UP PLAN    Complete laboratory testing for hormone levels Schedule a follow-up appointment in 3 months Continue physical therapy as scheduled Call the office if you experience:  Severe or persistent side effects from any new medications Worsening of hot flashes or other menopausal symptoms Any unusual bleeding New or unexplained symptoms WARNING SIGNS - WHEN TO SEEK IMMEDIATE CARE Call 911 or go to the emergency  room if you experience: Sudden severe headache Vision changes or loss Chest  pain or pressure Difficulty breathing Severe abdominal pain Severe leg pain with swelling or redness Slurred speech or facial drooping    We're committed to helping you manage your symptoms and improve your quality of life. Please reach out if you have any questions or concerns before your next appointment.                     # Hormone Replacement Therapy (HRT): Patient Education and Informed Consent  ## What is Hormone Replacement Therapy?  Hormone Replacement Therapy (HRT) is a treatment that replaces hormones that your body no longer produces after menopause or surgical removal of the ovaries. For women who have had a total hysterectomy and bilateral salpingo-oophorectomy (TAH-BSO), estrogen-only therapy is typically prescribed since there is no uterus requiring progesterone for protection.  ## Your Specific Situation  You underwent a total abdominal hysterectomy with bilateral salpingo-oophorectomy (TAH-BSO) approximately 6 years ago. Since then, you have experienced severe vasomotor symptoms (hot flashes and night sweats), mood changes, and fatigue. These symptoms have not responded to over-the-counter remedies like Estroven.  ## Benefits of HRT  For you specifically, HRT may provide the following benefits: - Reduction or elimination of hot flashes and night sweats - Improved sleep quality - Stabilization of mood fluctuations - Increased energy levels - Prevention of bone loss (osteoporosis) - Relief from vaginal dryness and associated discomfort - Potential improvement in cognitive function  ## Risks of HRT  Given your personal and family medical history, it's important to consider these potential risks:  **Increased risk factors you have:** - Family history of hormone-sensitive cancers (grandmother with ovarian and breast cancer) - Elevated BMI (currently 39.47 kg/m) -  History of gastric ulcer  **Potential risks of HRT:** 1. **Cardiovascular risks:**    - Blood clots (deep vein thrombosis or pulmonary embolism)    - Stroke    - Heart disease (though risk is lower for women who start HRT before age 60 or within 10 years of menopause)  2. **Cancer risks:**    - Breast cancer (risk increases with duration of use)    - Note: With no uterus, there is no risk of endometrial cancer  3. **Other potential side effects:**    - Breast tenderness    - Bloating    - Headaches    - Nausea    - Fluid retention  ## Risk Mitigation Strategies  To minimize risks while maximizing benefits: 1. We will use the lowest effective dose to control your symptoms 2. We will use a transdermal (skin) application method to reduce cardiovascular risks 3. Regular monitoring with:    - Annual mammograms    - Regular blood pressure checks    - Periodic liver function tests    - Symptom assessment at each visit  ## Alternatives to HRT  If you decide against HRT or if it proves unsuitable, these alternatives exist: 1. Non-hormonal prescription medications:    - Fezolinetant (Veozah) - currently seeking insurance approval    - Certain antidepressants (SSRIs/SNRIs) at lower doses    - Gabapentin or pregabalin    - Clonidine     2. Lifestyle modifications:    - Core body temperature management (layered clothing, cooling pillows)    - Stress reduction techniques    - Regular exercise    - Dietary adjustments (reducing caffeine, alcohol, and spicy foods)  3. Complementary approaches (limited scientific evidence):    - Acupuncture    - Certain herbal supplements (black cohosh,  evening primrose oil)  ## Informed Consent  I acknowledge that: - I have been informed about the benefits and risks of hormone replacement therapy - I understand my personal risk factors including family history of cancer and elevated BMI - I have had the opportunity to ask questions and discuss  alternatives - I understand that regular follow-up and monitoring are essential - I can discontinue treatment at any time - I will report any concerning symptoms immediately  Patient Signature: ______________________________ Date: ______________  Physician Signature: ____________________________ Date: ______________   # HRT Formulation Guidance  ## Patient-Specific Considerations  Based on the patient's clinical profile: - 6 years post TAH-BSO - Severe vasomotor symptoms (hot flashes and night sweats) - No previous hormone therapy trials - Family history of hormone-sensitive cancers - BMI of 39.47 kg/m - History of gastric ulcer - Variable blood pressure (occasionally elevated) - Complex medication regimen including psychotropic medications  ## Recommended HRT Approach  ### Formulation Type  **Transdermal estradiol** is the recommended first-line therapy for this patient for several reasons: 1. **Reduced thromboembolic risk** compared to oral formulations (important given her elevated BMI) 2. **Bypasses first-pass liver metabolism**, which:    - Reduces impact on clotting factors    - Minimizes interactions with her extensive medication regimen    - Reduces stress on hepatic function (relevant with her history of gastric ulcer) 3. **Provides stable hormone levels** without daily peaks and troughs 4. **Simplifies adherence** with twice-weekly application (compared to daily oral medication)  ### Dosing Recommendations  Start with **estradiol transdermal patch 0.025 mg/day** (lowest available dose): - Apply patch to clean, dry, hairless skin on lower abdomen or upper buttock - Change patch twice weekly (every 3-4 days) on consistent days - Rotate application sites to prevent skin irritation  ### Monitoring and Dose Adjustment  1. **Initial follow-up at 4-6 weeks** to assess:    - Symptom control    - Side effects    - Blood pressure    - Tolerability  2. **Dose  adjustment protocol**:    - If symptoms persist with minimal side effects, consider increasing to 0.0375 mg/day    - Maximum recommended dose given risk factors: 0.05 mg/day    - Allow 4-6 weeks between dose adjustments to assess efficacy  3. **Long-term monitoring**:    - Clinical evaluation every 3-6 months for the first year, then annually    - Blood pressure at each visit    - Annual mammogram    - Consider baseline and annual bone density scan    - Consider baseline and periodic liver function tests  ### Special Considerations  1. **Potential drug interactions** to monitor:    - Venlafaxine (may have additive effect on hot flash reduction)    - Alprazolam (may require dose adjustment)    - Gabapentin (may have additive effect on hot flash reduction)  2. **Complementary symptom management**:    - Consider low-dose vaginal estrogen for urogenital symptoms if not addressed by systemic therapy    - Maintain current gabapentin which may provide synergistic benefit for hot flashes  3. **Duration of therapy**:    - Plan to reassess benefit/risk ratio annually    - Consider gradual dose reduction after 2-3 years if symptoms are well-controlled    - Minimum effective dose for shortest duration needed to control symptoms  4. **Patient education**:    - Importance of reporting any unexplained bleeding, breast changes, or leg pain/swelling    - Proper patch application and replacement  schedule    - Strategies to manage breakthrough symptoms

## 2024-02-02 NOTE — Assessment & Plan Note (Signed)
 Weight disorder/Obesity (E66.9) BMI currently 39.46 (252 lbs), showing gradual weight gain over past year (from 231 lbs in August 2024). Weight contributing to other health issues including OSA and musculoskeletal problems. 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 Discussed potential use of Zepbound (tirzepatide), a GLP-1 receptor agonist, for weight management. Reviewed mechanism of action, expected benefits, and potential side effects. Obtained informed consent after discussing benefits, risks, and insurance considerations. PLAN: Submit prior authorization for tirzepatide (Zepbound) 2.5 mg subcutaneous weekly Consider Wegovy as alternative if Zepbound not approved Discussed importance of continuing dietary modifications and physical activity

## 2024-02-02 NOTE — Assessment & Plan Note (Signed)
 OSA (obstructive sleep apnea) (G47.33) Diagnosed with mild obstructive sleep apnea. Weight management will be important for symptom control.   PLAN: Continue current OSA management Weight loss with GLP-1 agonist therapy may improve symptoms and is FDA approved Reassess need for sleep study after significant weight loss

## 2024-02-02 NOTE — Assessment & Plan Note (Signed)
 Depression (F32.9) History of depression, currently well-managed with medication (venlafaxine XR 300 mg daily, mirtazapine 30 mg daily). Denies current depressive symptoms. PLAN: Continue current antidepressant regimen Monitor for mood changes with initiation of HRT Follow up as needed

## 2024-02-02 NOTE — Assessment & Plan Note (Signed)
 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 PLAN: Continue current pain management regimen Physical therapy for core strengthening when knee rehabilitation allows Weight management as noted above will help reduce axial loading

## 2024-02-02 NOTE — Assessment & Plan Note (Signed)
 Knee pain, right, chronic (M25.561) History of falls due to knee buckling. 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 Currently in physical therapy for knee instability, reduced from twice weekly to once weekly due to insurance limitations. Performing home exercises for mobility. PLAN: Continue prescribed physical therapy Maintain home exercise program Weight management will help reduce mechanical stress on joint Follow up with orthopedics as scheduled

## 2024-02-02 NOTE — Assessment & Plan Note (Signed)
 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 Symptoms significantly impact daily activities and quality of life, including disrupted sleep and inability to perform routine tasks like applying makeup without overheating. Prior treatments with OTC remedies Jeffie Pollock) have been ineffective. Family history notable for maternal breast cancer (60s) and ovarian cancer, raising concern for hormone-sensitive malignancies. Discussed risks of hormone replacement therapy including increased risk of stroke and breast cancer, particularly given family history. Also reviewed potential benefits of estrogen replacement including improved vasomotor symptoms, reduced osteoporosis risk, and improved skin health. Obtained informed consent after discussing risks, benefits, and alternatives. PLAN: Initiate transdermal estradiol (Vivelle-Dot) 0.05 mg/24hr patch, 1 patch twice weekly Order perimenopausal lab set to evaluate hormone levels Submit prior authorization/appeal for Fezolinetant (Veozah) 45 mg daily as a non-hormonal alternative for hot flash management Recommended lifestyle modifications (layered clothing, avoiding triggers, cooling techniques) Discussed alternative treatments including acupuncture and herbal remedies as adjunctive options

## 2024-02-02 NOTE — Assessment & Plan Note (Signed)
 Hyperlipidemia (E78.5) Currently on atorvastatin 40 mg daily. Recent lipids (July 2024): total cholesterol 157, triglycerides 184 (elevated), HDL 37.2 (low), LDL 83. Family history of genetic vascular disease. PLAN: Continue atorvastatin 40 mg daily Recheck lipid panel in 3-6 months

## 2024-02-03 ENCOUNTER — Encounter (HOSPITAL_BASED_OUTPATIENT_CLINIC_OR_DEPARTMENT_OTHER): Payer: BC Managed Care – PPO | Admitting: Orthopaedic Surgery

## 2024-02-04 ENCOUNTER — Encounter: Payer: Self-pay | Admitting: Internal Medicine

## 2024-02-04 NOTE — Progress Notes (Signed)
 Tests normal. All pretty normal, confirms menopausal status but estrogen is not that low.  Make sure hot flashes are not fevers.

## 2024-02-07 LAB — ALB+PRL+FSH+LH+PROG+DHEA+ES...
Albumin: 4.1 g/dL (ref 3.9–4.9)
Dehydroepiandrosterone: 38 ng/dL (ref 31–701)
Estrogen: 276 pg/mL
FSH: 24.3 m[IU]/mL
LH: 34.9 m[IU]/mL
Progesterone: 0.3 ng/mL
Prolactin: 16.9 ng/mL (ref 4.8–33.4)
Sex Hormone Binding: 31.8 nmol/L (ref 24.6–122.0)
Vit D, 25-Hydroxy: 44.8 ng/mL (ref 30.0–100.0)

## 2024-02-07 NOTE — Telephone Encounter (Signed)
 MyChart secure digital messaging clinical encounter  Chief Complaint:  Patient requesting assistance with insurance appeal for Zepbound (tirzepatide) coverage.  Relevant History: - 43 year old female with BMI 39.46 (252 lbs), showing weight gain from 231 lbs in August 2024 - Prediabetes (A1c 5.8% documented 07/13/2023) - Mild obstructive sleep apnea requiring management - Chronic knee pain with full-thickness chondral defect documented on MRI January 2025 - Chronic back pain with disc degeneration at L3-L4 and L4-L5 on MRI February 2025 - Hyperlipidemia with elevated triglycerides (184) and low HDL (37.2) documented July 2024 - Previous attempt at GLP-1 agonist therapy [facility]) with insurance challenges - Multiple failed weight loss attempts through diet and exercise alone  Assessment: Patient's request for [employer] appeal is medically appropriate given her clinical profile. With a BMI of 39.46 and multiple weight-related comorbidities, patient meets FDA approval criteria for tirzepatide for chronic weight management. Weight reduction would significantly benefit her OSA, joint pain, metabolic profile, and overall health. Zepbound has demonstrated superior efficacy for weight loss with recent FDA approval for obesity management.  Plan: 1. Submit insurance appeal for Zepbound (tirzepatide) highlighting medical necessity based on:    - BMI 39.46, well above the threshold of 30 (or 27 with comorbidities) required for FDA-indicated use    - Documented multiple comorbidities exacerbated by obesity including OSA, prediabetes, joint disease, and hyperlipidemia    - Progressive weight gain despite lifestyle interventions    - Potential cost savings to insurance through reduced progression of comorbid conditions  2. Document previous weight loss attempts and failures    - Ella Bodo previously but faced insurance challenges    - Continued weight gain despite lifestyle modification  attempts  3. If appeal is denied, will discuss alternative options:    - Savings card through manufacturer    - Alternative weight management medications    - Ongoing lifestyle modifications  Medical Decision Making: Moderate complexity due to: - Multiple comorbidities requiring consideration of medication interactions - Need to evaluate potential benefits versus costs of therapy - Consideration of insurance coverage challenges and alternative treatment plans - Analysis of recent clinical data including weight trends, lipid panel, and A1c  Current Medical Guidelines: According to current obesity treatment guidelines, GLP-1 receptor agonists are recommended for individuals with BMI >=30 kg/m or >=27 kg/m with weight-related comorbidities. Tirzepatide has demonstrated superior weight loss efficacy (average 15-20% body weight reduction) compared to other anti-obesity medications and would be particularly beneficial for this patient with multiple obesity-related comorbidities.  Please see the MyChart message reply(ies) for my assessment and plan.   This patient gave consent for this Medical Advice Message and is aware that it may result in a bill to [employer], as well as the possibility of receiving a bill for a co-payment or deductible. They are an established patient, but are not seeking medical advice exclusively about a problem treated during an in person or video visit in the last seven days. I did not recommend an in person or video visit within seven days of my reply.    I spent a total of 15 minutes cumulative time within 7 days through [employer]. Lula Olszewski, MD    Below is the appeal letter which is to be mailed on patient behalf.  02/07/2024  [employer] Name] [Address] [place] C[place], ZIP] Re: Appeal for Coverage of Zepbound (tirzepatide) Patient: Diane [name] Name] Policy Number: Marny Lowenstein Number] DOB: Dorna Bloom of Birth] Dear Medical Review  Department: I am writing to appeal the denial of  coverage for Zepbound (tirzepatide) for my patient, [Patient Name]. This letter outlines the medical necessity of this medication based on the patient's clinical condition and supporting evidence. Patient Clinical Profile: 43 year old female with class II obesity (BMI 39.46) Progressive weight gain (from 231 lbs in August 2024 to 252 lbs in March 2025) Documented prediabetes (A1c 5.8% as of August 2024) Obstructive sleep apnea requiring ongoing management Musculoskeletal comorbidities:  Chronic knee pain with MRI-documented full-thickness chondral defect of the lateral femoral condyle with severe subchondral marrow edema (January 2025) Chronic back pain with MRI-documented disc degeneration at L3-L4 and L4-L5 with nerve foraminal stenosis (February 2025) Hyperlipidemia with elevated triglycerides (184 mg/dL) and low HDL (16.1 mg/dL) Multiple failed weight loss attempts despite lifestyle interventions Medical Necessity: Zepbound (tirzepatide) is FDA-approved for chronic weight management in adults with a BMI of 30 kg/m or greater (obesity), or 27 kg/m or greater (overweight) in the presence of at least one weight-related comorbid condition. My patient exceeds these criteria with a BMI of 39.46 and multiple weight-related comorbidities. Clinical Rationale for Zepbound: Documented Weight-Related Comorbidities: The patient's prediabetes, OSA, joint deterioration, and dyslipidemia are all directly exacerbated by her obesity. Significant weight reduction would improve all of these conditions. Progressive Weight Gain Despite Interventions: Despite attempts at lifestyle modification, the patient has experienced progressive weight gain of 21 pounds over the past 7 months, indicating the need for pharmacological intervention. Failure of Prior Treatment Approaches: The patient has attempted multiple weight management strategies without sustained success,  including lifestyle modifications and structured diet programs. Unique Mechanism of Action: Tirzepatide's dual GIP/GLP-1 receptor agonist mechanism has demonstrated superior efficacy for weight loss compared to other anti-obesity medications, with clinical trials showing up to 20.9% body weight reduction. Cost-Effectiveness: Treating the patient's obesity with Zepbound now will likely reduce future healthcare costs associated with progression of her multiple comorbidities, potentially avoiding costly interventions like knee replacement surgery, sleep apnea treatment escalation, and management of worsening metabolic conditions. Clinical Evidence: Clinical trials for tirzepatide have demonstrated that: Up to 20.9% body weight reduction can be achieved Significant improvements in glycemic control occur in prediabetic patients Improvements in sleep apnea symptoms and severity have been documented Reduction in joint pain and improvement in mobility measures have been observed Favorable effects on lipid profiles have been demonstrated Conclusion: Based on the patient's clinical profile, the medical literature supporting tirzepatide's efficacy, and the FDA-approved indications, Zepbound is medically necessary for this patient. Covering this medication now represents a cost-effective approach to preventing the progression and complications of multiple obesity-related conditions. I respectfully request reconsideration of coverage for Zepbound (tirzepatide) for this patient. Please feel free to contact me if additional information is needed to support this appeal. Sincerely,  Lula Olszewski MD, ABIM-certified [employer] License #[id number] [facility] [address] [place] [address] Dept: [phone] Dept Fax: [phone]  Enclosures: Clinical notes and documentation Relevant laboratory and imaging results Medication history  and prior authorization documentation

## 2024-02-08 ENCOUNTER — Other Ambulatory Visit: Payer: Self-pay

## 2024-02-08 ENCOUNTER — Ambulatory Visit: Admitting: Physical Medicine and Rehabilitation

## 2024-02-08 ENCOUNTER — Encounter (HOSPITAL_BASED_OUTPATIENT_CLINIC_OR_DEPARTMENT_OTHER): Payer: Self-pay | Admitting: Physical Therapy

## 2024-02-08 ENCOUNTER — Telehealth: Payer: Self-pay | Admitting: Pharmacist

## 2024-02-08 ENCOUNTER — Other Ambulatory Visit (HOSPITAL_COMMUNITY): Payer: Self-pay

## 2024-02-08 ENCOUNTER — Telehealth: Payer: Self-pay

## 2024-02-08 VITALS — BP 118/70 | HR 111

## 2024-02-08 DIAGNOSIS — M5416 Radiculopathy, lumbar region: Secondary | ICD-10-CM

## 2024-02-08 MED ORDER — METHYLPREDNISOLONE ACETATE 40 MG/ML IJ SUSP
40.0000 mg | Freq: Once | INTRAMUSCULAR | Status: AC
  Administered 2024-02-08: 40 mg

## 2024-02-08 NOTE — Telephone Encounter (Signed)
 To submit an appeal to [employer], a copy of the denial letter is required. Additionally, I was unable to find documentation in the chart indicating who submitted the prior authorization and when it was submitted.  Once the denial letter is received, it will be reviewed and an appeal can be started.  Thank you, Dellie Burns, PharmD Clinical Pharmacist  [place]  Direct Dial: [phone]

## 2024-02-08 NOTE — Telephone Encounter (Signed)
 Pharmacy Patient Advocate Encounter   Received notification from Patient Advice Request messages that prior authorization for ZEPBOUND is required/requested.   Insurance verification completed.   The patient is insured through [facility] .   Per test claim:  Weight loss drugs are not covered, no prescription drug benefit

## 2024-02-08 NOTE — Progress Notes (Unsigned)
 Pain Scale   Average Pain 9        +Driver, -BT, -Dye Allergies.

## 2024-02-08 NOTE — Patient Instructions (Signed)

## 2024-02-09 ENCOUNTER — Ambulatory Visit (HOSPITAL_BASED_OUTPATIENT_CLINIC_OR_DEPARTMENT_OTHER): Admitting: Physical Therapy

## 2024-02-09 DIAGNOSIS — M25661 Stiffness of right knee, not elsewhere classified: Secondary | ICD-10-CM

## 2024-02-09 DIAGNOSIS — R2689 Other abnormalities of gait and mobility: Secondary | ICD-10-CM

## 2024-02-09 DIAGNOSIS — R600 Localized edema: Secondary | ICD-10-CM

## 2024-02-09 DIAGNOSIS — M25561 Pain in right knee: Secondary | ICD-10-CM

## 2024-02-09 NOTE — Telephone Encounter (Signed)
 Appeal was submitted yesterday for Zepbound by Joline Salt.

## 2024-02-09 NOTE — Therapy (Signed)
 OUTPATIENT PHYSICAL THERAPY LOWER EXTREMITY TREATMENT   Patient Name: Diane [name] MRN: [id number] DOB:September 24, 1981, 43 y.o., female Today's Date: 02/10/2024  END OF SESSION:  PT End of Session - 02/09/24 1245     Visit Number 3    Number of Visits 12    Date for PT Re-Evaluation 03/27/24    Authorization Type BCBS 20 VL    PT Start Time 1153    PT Stop Time 1238    PT Time Calculation (min) 45 min    Activity Tolerance Patient tolerated treatment well    Behavior During Therapy WFL for tasks assessed/performed                Past Medical History:  Diagnosis Date   ADD (attention deficit disorder with hyperactivity)    Allergy    Anxiety    Arthritis    Atypical chest pain 07/26/2021   Atypical chest pain     Benign intracranial hypertension 06/16/2008   Qualifier: Diagnosis of   By: Nira Conn         Choking    due to food and pills get stuck   Cholelithiasis and acute cholecystitis without obstruction 12/02/2016   Chronic cough 12/27/2013   Depression    Dry mouth 08/12/2017   Dysmenorrhea    Family history of adverse reaction to anesthesia    pt's father has hx. of being hard to wake up post-op   Gastric ulcer 03/16/2015   GERD (gastroesophageal reflux disease)    Headache 07/09/2007   Qualifier: Diagnosis of   By: Cato Mulligan MD, Bruce      Replacing diagnoses that were inactivated after the 02/16/23 regulatory import     History of gastric ulcer    History of kidney stones    Hyperlipidemia    IBS (irritable bowel syndrome)    Lower extremity edema 07/26/2021   Lower extremity edema     Migraines    neurologist-  dr c. Sharene Skeans ([facility] in [employer])-- treated with Emgality injection every 30 days   Mild obstructive sleep apnea    per study in epic 03-21-2017 mild osa, recommendation cpap, mouth appliance, wt loss   Nausea and vomiting 12/03/2016   OAB (overactive bladder) 08/12/2017   Osteoarthritis    right ankle   Osteochondral  lesion 09/2016   right ankle   Osteomyelitis of knee region [facility])    Peripheral vascular disease (HCC)    NERVE DAMAGE RIGHT LEG AND FOOT DUE TO INJURGY.    Plantar fasciitis of left foot    Pleural effusion 12/15/2013   PONV (postoperative nausea and vomiting)    AND HEADACHES   Pseudotumor cerebri    at back head   RUQ pain    S/P dilatation of esophageal stricture 07/2018   Sinusitis 08/12/2018   RESOLVED WITH ANTIOBIOTIC   SUI (stress urinary incontinence, female)    Wears contact lenses    Wears partial dentures    upper   Past Surgical History:  Procedure Laterality Date   ANKLE ARTHROSCOPY Right 10/22/2016   Procedure: ANKLE ARTHROSCOPY;  Surgeon: Vivi Barrack, DPM;  Location: [facility];  Service: Podiatry;  Laterality: Right;  GENERAL/REG BLOCK   ANKLE ARTHROSCOPY Right 09-15-2017   @[facility]    BLADDER SURGERY  child   x 2 - as a child to stretch bladder   CESAREAN SECTION  12/24/2009   CESAREAN SECTION  11/20/2011   Procedure: CESAREAN SECTION;  Surgeon: Lenoard Aden, MD;  Location: [facility];  Service: Gynecology;  Laterality: N/A;   CHOLECYSTECTOMY N/A 12/03/2016   Procedure: LAPAROSCOPIC CHOLECYSTECTOMY WITH INTRAOPERATIVE CHOLANGIOGRAM;  Surgeon: Darnell Level, MD;  Location: [facility];  Service: General;  Laterality: N/A;   CHONDROPLASTY Right 01/12/2024   Procedure: RIGHT KNEE ARTHROSCOPY LATERAL FEMORAL CHONDROPLASTY;  Surgeon: [name], MD;  Location: [facility];  Service: Orthopedics;  Laterality: Right;   CYSTOSCOPY WITH RETROGRADE PYELOGRAM, URETEROSCOPY AND STENT PLACEMENT Left 08/10/2006   ESOPHAGOGASTRODUODENOSCOPY (EGD) WITH ESOPHAGEAL DILATION  07/2018   HARDWARE REMOVAL Right 07/01/2016   Procedure: HARDWARE REMOVAL;  Surgeon: Cammy Copa, MD;  Location: [facility];  Service: Orthopedics;  Laterality: Right;   HARVEST BONE GRAFT Right 01/12/2024   Procedure: RIGHT BONE MARROW ASPIRATE FROM ILIAC CREST DECOMPRESSION AND  PLACEMENT RIGHT LATERAL FEMORAL CONDYLE;  Surgeon: [name], MD;  Location: [facility];  Service: Orthopedics;  Laterality: Right;   KNEE ARTHROSCOPY WITH LATERAL RELEASE Right 12/13/2014   Procedure: KNEE ARTHROSCOPY WITH LATERAL RELEASE;  Surgeon: Thera Flake., MD;  Location: [facility];  Service: Orthopedics;  Laterality: Right;   LAPAROSCOPIC APPENDECTOMY N/A 06/11/2014   Procedure: APPENDECTOMY LAPAROSCOPIC;  Surgeon: Velora Heckler, MD;  Location: [facility];  Service: General;  Laterality: N/A;   ORIF ANKLE FRACTURE Right 01/05/2016   Procedure: OPEN REDUCTION INTERNAL FIXATION (ORIF) ANKLE FRACTURE;  Surgeon: Cammy Copa, MD;  Location: [facility];  Service: Orthopedics;  Laterality: Right;   OVARIAN CYST SURGERY Right 03/05/2007   AND CHROMOPERTUBATION  VIA LAPAROSCOPY   ROBOTIC ASSISTED LAPAROSCOPIC HYSTERECTOMY AND SALPINGECTOMY Bilateral 08/27/2018   Procedure: XI ROBOTIC ASSISTED LAPAROSCOPIC HYSTERECTOMY AND BILATERAL SALPINGECTOMY;  Surgeon: Olivia Mackie, MD;  Location: [facility];  Service: Gynecology;  Laterality: Bilateral;  [facility] for 23hr OBS   SYNOVECTOMY Right 12/13/2014   Procedure: PLICA SYNOVECTOMY;  Surgeon: Thera Flake., MD;  Location:  SURGERY CENTER;  Service: Orthopedics;  Laterality: Right;   TONSILLECTOMY AND ADENOIDECTOMY  child   WISDOM TOOTH EXTRACTION     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 01/12/2024   AVN (avascular necrosis of bone) (HCC) 01/12/2024   Prediabetes 07/14/2023   Weight disorder 07/14/2023   Ankle swelling 07/14/2023   Menopausal syndrome (hot flushes) 07/13/2023   Hyperlipidemia 06/19/2023   Intractable pain 06/17/2023   Chronic constipation 06/17/2023   Other idiopathic scoliosis, lumbar region 06/17/2023   Dysphagia 08/12/2017   Osteochondral lesion 06/03/2017   OSA (obstructive sleep apnea) 03/24/2017   Ankle pain, chronic  01/05/2016   Knee pain, right, chronic 04/12/2015   Chronic migraine without aura 04/01/2013   DEPRESSION, ACUTE 10/02/2008   Opioid use disorder in remission 10/02/2008   back pain, chronic 03/28/2008   Obesity (BMI 30-39.9) 01/25/2008   Anxiety state 11/09/2007    PCP: Glenetta Hew MD  REFERRING PROVIDER: [name] MD  REFERRING DIAG: RIGHT KNEE ARTHROSCOPY LATERAL FEMORAL CHONDROPLASTYRightGeneral RIGHT BONE MARROW ASPIRATE FROM ILIAC CREST DECOMPRESSION AND PLACEMENT RIGHT LATERAL FEMORAL CONDYLE  THERAPY DIAG:  Stiffness of right knee, not elsewhere classified  Acute pain of right knee  Other abnormalities of gait and mobility  Localized edema  Rationale for Evaluation and Treatment: Rehabilitation  ONSET DATE: 01/12/2024  SUBJECTIVE:   SUBJECTIVE STATEMENT: Pt is 4 weeks s/p Right knee arthroscopy with lateral femoral condyle chondroplasty, core decompression, sequestrectomy right knee lateral femoral condyle, and Right iliac crest bone marrow aspiration.  Pt states her back is bothering her.  She has increased lumbar pain standing in the shower.  Pt was sitting on the ledge of the tub and her feet slipped out from her when she was standing up.  She fell onto her R side.  Pt bruised her R hip and medial L ankle.  Pt states her knee hurt a little more after the fall which has improved.  Pt had her back injection yesterday and states MD informed her she could resume PT today.  Pt states her back is feeling a little better today.  She has increased pain if she is up awhile.   Pt reports having increased pain after last Rx which lasted for a day.  Pt reports she has a little increased pain with HEP.  Pt has been up more and has swelling in knee.  Pt's R knee still gives out some.  She has been ambulating without crutches at home.  Pt states she is able to bend knee well though has difficulty straightening.    PERTINENT HISTORY: MD note on 3/12 indicated pt to be WBAT  and she should be in her brace for 2 additional weeks.   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  PAIN:  Are you having pain? Yes: NPRS scale:  5/10  /   6/10  Pain location: anterior R knee    /      lumbar  Pain description: shooting  Aggravating factors:standing and walking  Relieving factors: rest   PRECAUTIONS: None  RED FLAGS: None   WEIGHT BEARING RESTRICTIONS: Yes WBAT   FALLS:  Has patient fallen in last 6 months? Yes. Number of falls fallen a few times. Not sure why   LIVING ENVIRONMENT: Lives with:  At moms right now 3 into that house   10 steps at her house to get in and a second floor at home.   OCCUPATION:  Stay at home mom  Hobbies:  Working out  Walking     PLOF: Independent  PATIENT GOALS:  To be able to walk and work out    NEXT MD VISIT:  Wednesday next week.   OBJECTIVE:  Note: Objective measures were completed at Evaluation unless otherwise noted.  DIAGNOSTIC FINDINGS: nothing post op   PATIENT SURVEYS:  LEFS 55/80  COGNITION: Overall cognitive status: Within functional limits for tasks assessed     SENSATION: WFL  EDEMA:  Circumferential:     POSTURE: rounded shoulders and forward head  PALPATION: Significant spasming in bilateral lumbar paraspinals and gluteals.   LOWER EXTREMITY ROM:  Passive ROM Right eval Left eval Left 3/17 Left 3/25  Hip flexion      Hip extension      Hip abduction      Hip adduction      Hip internal rotation      Hip external rotation      Knee flexion  86 111 AAROM 123 AAROM  Knee extension  -5 0   Ankle dorsiflexion      Ankle plantarflexion      Ankle inversion      Ankle eversion       (Blank rows = not tested)  LOWER EXTREMITY MMT:  MMT Right eval Left eval  Hip flexion    Hip extension    Hip  abduction  Hip adduction    Hip internal rotation    Hip external rotation    Knee flexion    Knee extension    Ankle dorsiflexion    Ankle plantarflexion    Ankle inversion    Ankle eversion     (Blank rows = not tested) not tested 2nd to recent surgery    GAIT: Walking with minimal weight bearing and crutches. Patient reports she uses a walkier at home                                                                                                                                TREATMENT DATE: 02/09/24 Reviewed pt presentation, response to prior Rx, and pain levels.  Gait:  PT instructed pt in using 1 crutch, proper sequencing, and heel to toe gait.  Pt ambulated with 1 crutch and brace 0-90 deg up/down the hallway and in the clinic.  Standing weight shifts in brace with bilat UE support on rail s/s and f/b x 10 reps each  OBSERVATION:  PT observed R hip and R knee.  Incisions are intact and dry with some scabbing.  The spots/abrasions above the knee incisions/portals are healing well.       Reviewed HEP. Pt received R knee flexion and extension PROM per pt and tissue tolerance. Assessed ROM  Quad sets 2x10 with 5 sec hold Glute sets with 5 sec hold 2x10 Unable to perform supine SLR with and without brace.  Pt was able to perform in brace with PT assistance. SAQ 2x10 Supine Heel slides with strap x 10 reps    PATIENT EDUCATION:  Education details: HEP, symptom management, exercise form, relevant anatomy, ROM findings, POC, and PT answered pt's questions.  Person educated: Patient Education method: Explanation, Demonstration, Tactile cues, Verbal cues, and Handouts Education comprehension: verbalized understanding, returned demonstration, verbal cues required, tactile cues required, and needs further education  HOME EXERCISE PROGRAM: Access Code: [email or web address] URL: [email or web address] Date: 01/26/2024 Prepared by: Lorayne Bender    ASSESSMENT:  CLINICAL  IMPRESSION: Pt continues to have lumbar pain and had a lumbar injection yesterday.  Pt presents to Rx with bilat crutches and knee brace unlocked from 0-90 deg.  PT had pt ambulate with 1 crutch and the brace and instructed her in proper sequencing and gait pattern.  Pt did well ambulating with 1 crutch and had no instability.  PT observed incision sites.  Pt is still using the cream for the rash and her skin continues to improve.  PT performed knee PROM and she tolerated PROM well.  Pt is making good progress with knee ROM and demonstrates improved flexion ROM as evidenced by goniometric measurements.  PT had pt try supine SLR and pt was unable to perform supine SLR with and without brace.  Pt tolerated Rx well and had no c/o's after Rx.  She should benefit from continued skilled PT to address impairments and goals and to assist in  restoring desired level of function.               OBJECTIVE IMPAIRMENTS: Abnormal gait, decreased activity tolerance, decreased endurance, decreased knowledge of use of DME, decreased mobility, difficulty walking, decreased ROM, decreased strength, increased edema, and pain.   ACTIVITY LIMITATIONS: carrying, lifting, bending, standing, squatting, sleeping, stairs, transfers, dressing, locomotion level, and caring for others  PARTICIPATION LIMITATIONS: meal prep, cleaning, laundry, driving, shopping, community activity, and yard work  PERSONAL FACTORS: 1-2 comorbidities: right ankle ORIF/ Lumbar spine pain    are also affecting patient's functional outcome.   REHAB POTENTIAL: Good  CLINICAL DECISION MAKING: Evolving/moderate complexity lumbar spine pain decreasing overall mobility   EVALUATION COMPLEXITY: Low   GOALS: Goals reviewed with patient? Yes  SHORT TERM GOALS: Target date: 02/23/2024   Patient will increase passive right knee flexion to 120 degrees Baseline: Goal status: INITIAL  2.  Patient will report a 50% reduction in pain in her low back   Baseline:  Goal status: INITIAL  3.  Patient will progress off assistive device Baseline:  Goal status: INITIAL  4.  Patient will be independent with initial HEP  Baseline:  Goal status: INITIAL   LONG TERM GOALS: Target date: 03/22/2024    Patient will ambulate community distances without increased pain  Baseline:  Goal status: INITIAL  2.  Patient will go up/down 12 steps without increased pain in order to get to her bedroom  Baseline:  Goal status: INITIAL  3.  Patient will squat to pick item off the goound without pain  Baseline:  Goal status: INITIAL  4.  Patient will have a complete gym and home program  Baseline:  Goal status: INITIAL  PLAN:  PT FREQUENCY: 2x/week  PT DURATION: 8 weeks  PLANNED INTERVENTIONS: 97110-Therapeutic exercises, 97530- Therapeutic activity, [id number]- Neuromuscular re-education, 97535- Self Care, [id number]- Manual therapy, [id number]- Gait training, [phone]- Aquatic Therapy, 97014- Electrical stimulation (unattended), 97035- Ultrasound, Patient/Family education, Stair training, Taping, Dry Needling, DME instructions, Cryotherapy, and Moist heat  PLAN FOR NEXT SESSION:  Progress per patient tolerance. Progress weight bearing next visit. Continue with ROM as tolerated.  STM to lumbar spine. Patient will have an MRI of her spine soon.    Audie Clear III PT, DPT 02/10/24 5:57 PM

## 2024-02-09 NOTE — Telephone Encounter (Signed)
 Noted.

## 2024-02-10 ENCOUNTER — Encounter (HOSPITAL_BASED_OUTPATIENT_CLINIC_OR_DEPARTMENT_OTHER): Payer: Self-pay | Admitting: Physical Therapy

## 2024-02-11 ENCOUNTER — Ambulatory Visit (HOSPITAL_BASED_OUTPATIENT_CLINIC_OR_DEPARTMENT_OTHER): Admitting: Physical Therapy

## 2024-02-11 NOTE — Progress Notes (Signed)
 ZARAYAH LANTING - 43 y.o. female MRN [id number]  Date of birth: 1981/02/21  Office Visit Note: Visit Date: 02/08/2024 PCP: Lula Olszewski, MD Referred by: Lula Olszewski, MD  Subjective: Chief Complaint  Patient presents with   Lower Back - Pain   HPI:  JAYDIN JALOMO is a 43 y.o. female who comes in today at the request of Ellin Goodie, FNP and Dr. [name] for planned Left L4-5 Lumbar Interlaminar epidural steroid injection with fluoroscopic guidance.  The patient has failed conservative care including home exercise, medications, time and activity modification.  This injection will be diagnostic and hopefully therapeutic.  Please see requesting physician notes for further details and justification.   ROS Otherwise per HPI.  Assessment & Plan: Visit Diagnoses:    ICD-10-CM   1. Lumbar radiculopathy  M54.16 XR C-ARM NO REPORT    Epidural Steroid injection    methylPREDNISolone acetate (DEPO-MEDROL) injection 40 mg      Plan: No additional findings.   Meds & Orders:  Meds ordered this encounter  Medications   methylPREDNISolone acetate (DEPO-MEDROL) injection 40 mg    Orders Placed This Encounter  Procedures   XR C-ARM NO REPORT   Epidural Steroid injection    Follow-up: Return for visit to requesting provider as needed.   Procedures: No procedures performed  Lumbar Epidural Steroid Injection - Interlaminar Approach with Fluoroscopic Guidance  Patient: MELANE WINDHOLZ      Date of Birth: 1981-09-30 MRN: [id number] PCP: Lula Olszewski, MD      Visit Date: 02/08/2024   Universal Protocol:     Consent Given By: the patient  Position: PRONE  Additional Comments: Vital signs were monitored before and after the procedure. Patient was prepped and draped in the usual sterile fashion. The correct patient, procedure, and site was verified.   Injection Procedure Details:   Procedure diagnoses: Lumbar radiculopathy [M54.16]   Meds Administered:   Meds ordered this encounter  Medications   methylPREDNISolone acetate (DEPO-MEDROL) injection 40 mg     Laterality: Left  Location/Site:  L4-5  Needle: 4.5 in., 20 ga. Tuohy  Needle Placement: Paramedian epidural  Findings:   -Comments: Excellent flow of contrast into the epidural space.  Procedure Details: Using a paramedian approach from the side mentioned above, the region overlying the inferior lamina was localized under fluoroscopic visualization and the soft tissues overlying this structure were infiltrated with 4 ml. of 1% Lidocaine without Epinephrine. The Tuohy needle was inserted into the epidural space using a paramedian approach.   The epidural space was localized using loss of resistance along with counter oblique bi-planar fluoroscopic views.  After negative aspirate for air, blood, and CSF, a 2 ml. volume of Isovue-250 was injected into the epidural space and the flow of contrast was observed. Radiographs were obtained for documentation purposes.    The injectate was administered into the level noted above.   Additional Comments:  The patient tolerated the procedure well Dressing: 2 x 2 sterile gauze and Band-Aid    Post-procedure details: Patient was observed during the procedure. Post-procedure instructions were reviewed.  Patient left the clinic in stable condition.   Clinical History: CLINICAL DATA:  43 year old female with low back pain radiating down the right leg.   EXAM: MRI LUMBAR SPINE WITHOUT CONTRAST   TECHNIQUE: Multiplanar, multisequence MR imaging of the lumbar spine was performed. No intravenous contrast was administered.   COMPARISON:  Lumbar radiographs 06/17/2023.   FINDINGS: Segmentation:  Normal on  the comparison.  Hypoplastic ribs at T12.   Alignment: Normal lumbar lordosis with subtle retrolisthesis of L3 on L4.   Vertebrae: Normal vertebral body height. Normal background bone marrow signal. Intact visible sacrum and SI  joints. No marrow edema or evidence of acute osseous abnormality.   Conus medullaris and cauda equina: Conus extends to the T12-L1 level. No lower spinal cord or conus signal abnormality. Capacious spinal canal. Normal cauda equina nerve roots.   Paraspinal and other soft tissues: Negative.   Disc levels:   T11-T12: Negative.   T12-L1:  Negative.   L1-L2:  Negative.   L2-L3:  Negative.   L3-L4: Disc desiccation. Subtle disc space loss and disc bulging asymmetric to the left. No stenosis.   L4-L5: Disc desiccation. Subtle disc space loss and circumferential disc bulging. Mild facet and ligament flavum hypertrophy. No spinal or lateral recess stenosis. Borderline to mild bilateral L4 neural foraminal stenosis.   L5-S1:  Negative.   IMPRESSION: 1. Mild disc degeneration at L3-L4 and L4-L5. Borderline to mild bilateral L4 nerve level foraminal stenosis. 2. Elsewhere normal MRI appearance of the Lumbar Spine.     Electronically Signed   By: Odessa Fleming M.D.   On: 01/08/2024 11:42     Objective:  VS:  HT:    WT:   BMI:     BP:118/70  HR:(!) 111bpm  TEMP: ( )  RESP:  Physical Exam Vitals and nursing note reviewed.  Constitutional:      General: She is not in acute distress.    Appearance: Normal appearance. She is well-developed. She is obese. She is not ill-appearing.  HENT:     Head: Normocephalic and atraumatic.     Right Ear: External ear normal.     Left Ear: External ear normal.  Eyes:     Extraocular Movements: Extraocular movements intact.     Conjunctiva/sclera: Conjunctivae normal.     Pupils: Pupils are equal, round, and reactive to light.  Cardiovascular:     Rate and Rhythm: Normal rate.     Pulses: Normal pulses.  Pulmonary:     Effort: Pulmonary effort is normal. No respiratory distress.  Abdominal:     General: There is no distension.     Palpations: Abdomen is soft.  Musculoskeletal:        General: Tenderness and signs of injury present.      Cervical back: Neck supple.     Right lower leg: No edema.     Left lower leg: No edema.     Comments: Patient has good distal strength with no pain over the greater trochanters.  No clonus or focal weakness. Brace on knee.  Skin:    General: Skin is warm and dry.     Findings: No erythema, lesion or rash.  Neurological:     General: No focal deficit present.     Mental Status: She is alert and oriented to person, place, and time.     Cranial Nerves: No cranial nerve deficit.     Sensory: No sensory deficit.     Motor: No weakness or abnormal muscle tone.     Coordination: Coordination normal.     Gait: Gait abnormal.  Psychiatric:        Mood and Affect: Mood normal.        Behavior: Behavior normal.      Imaging: No results found.

## 2024-02-11 NOTE — Procedures (Signed)
 Lumbar Epidural Steroid Injection - Interlaminar Approach with Fluoroscopic Guidance  Patient: Diane [name]      Date of Birth: 04/09/1981 MRN: [id number] PCP: Lula Olszewski, MD      Visit Date: 02/08/2024   Universal Protocol:     Consent Given By: the patient  Position: PRONE  Additional Comments: Vital signs were monitored before and after the procedure. Patient was prepped and draped in the usual sterile fashion. The correct patient, procedure, and site was verified.   Injection Procedure Details:   Procedure diagnoses: Lumbar radiculopathy [M54.16]   Meds Administered:  Meds ordered this encounter  Medications   methylPREDNISolone acetate (DEPO-MEDROL) injection 40 mg     Laterality: Left  Location/Site:  L4-5  Needle: 4.5 in., 20 ga. Tuohy  Needle Placement: Paramedian epidural  Findings:   -Comments: Excellent flow of contrast into the epidural space.  Procedure Details: Using a paramedian approach from the side mentioned above, the region overlying the inferior lamina was localized under fluoroscopic visualization and the soft tissues overlying this structure were infiltrated with 4 ml. of 1% Lidocaine without Epinephrine. The Tuohy needle was inserted into the epidural space using a paramedian approach.   The epidural space was localized using loss of resistance along with counter oblique bi-planar fluoroscopic views.  After negative aspirate for air, blood, and CSF, a 2 ml. volume of Isovue-250 was injected into the epidural space and the flow of contrast was observed. Radiographs were obtained for documentation purposes.    The injectate was administered into the level noted above.   Additional Comments:  The patient tolerated the procedure well Dressing: 2 x 2 sterile gauze and Band-Aid    Post-procedure details: Patient was observed during the procedure. Post-procedure instructions were reviewed.  Patient left the clinic in stable  condition.

## 2024-02-15 ENCOUNTER — Ambulatory Visit (HOSPITAL_BASED_OUTPATIENT_CLINIC_OR_DEPARTMENT_OTHER): Payer: Self-pay | Admitting: Physical Therapy

## 2024-02-16 ENCOUNTER — Ambulatory Visit (HOSPITAL_BASED_OUTPATIENT_CLINIC_OR_DEPARTMENT_OTHER): Admitting: Physical Therapy

## 2024-02-18 ENCOUNTER — Ambulatory Visit (HOSPITAL_BASED_OUTPATIENT_CLINIC_OR_DEPARTMENT_OTHER)

## 2024-02-22 ENCOUNTER — Ambulatory Visit (HOSPITAL_BASED_OUTPATIENT_CLINIC_OR_DEPARTMENT_OTHER): Payer: Self-pay | Admitting: Physical Therapy

## 2024-02-24 ENCOUNTER — Encounter (HOSPITAL_BASED_OUTPATIENT_CLINIC_OR_DEPARTMENT_OTHER): Payer: Self-pay | Admitting: Physical Therapy

## 2024-02-24 ENCOUNTER — Ambulatory Visit (HOSPITAL_BASED_OUTPATIENT_CLINIC_OR_DEPARTMENT_OTHER): Admitting: Orthopaedic Surgery

## 2024-02-24 ENCOUNTER — Ambulatory Visit (HOSPITAL_BASED_OUTPATIENT_CLINIC_OR_DEPARTMENT_OTHER): Payer: Self-pay | Attending: Orthopaedic Surgery | Admitting: Physical Therapy

## 2024-02-24 DIAGNOSIS — M25661 Stiffness of right knee, not elsewhere classified: Secondary | ICD-10-CM | POA: Insufficient documentation

## 2024-02-24 DIAGNOSIS — M25561 Pain in right knee: Secondary | ICD-10-CM | POA: Diagnosis present

## 2024-02-24 DIAGNOSIS — R600 Localized edema: Secondary | ICD-10-CM | POA: Diagnosis present

## 2024-02-24 DIAGNOSIS — R2689 Other abnormalities of gait and mobility: Secondary | ICD-10-CM | POA: Insufficient documentation

## 2024-02-24 DIAGNOSIS — M25562 Pain in left knee: Secondary | ICD-10-CM

## 2024-02-24 MED ORDER — LIDOCAINE HCL 1 % IJ SOLN
4.0000 mL | INTRAMUSCULAR | Status: AC | PRN
  Administered 2024-02-24: 4 mL

## 2024-02-24 MED ORDER — TRIAMCINOLONE ACETONIDE 40 MG/ML IJ SUSP
80.0000 mg | INTRAMUSCULAR | Status: AC | PRN
  Administered 2024-02-24: 80 mg via INTRA_ARTICULAR

## 2024-02-24 NOTE — Therapy (Signed)
 OUTPATIENT PHYSICAL THERAPY LOWER EXTREMITY TREATMENT   Patient Name: Diane [name] MRN: [id number] DOB:Sep 25, 1981, 43 y.o., female Today's Date: 02/24/2024  END OF SESSION:       Past Medical History:  Diagnosis Date   ADD (attention deficit disorder with hyperactivity)    Allergy    Anxiety    Arthritis    Atypical chest pain 07/26/2021   Atypical chest pain     Benign intracranial hypertension 06/16/2008   Qualifier: Diagnosis of   By: Nira Conn         Choking    due to food and pills get stuck   Cholelithiasis and acute cholecystitis without obstruction 12/02/2016   Chronic cough 12/27/2013   Depression    Dry mouth 08/12/2017   Dysmenorrhea    Family history of adverse reaction to anesthesia    pt's father has hx. of being hard to wake up post-op   Gastric ulcer 03/16/2015   GERD (gastroesophageal reflux disease)    Headache 07/09/2007   Qualifier: Diagnosis of   By: Cato Mulligan MD, Bruce      Replacing diagnoses that were inactivated after the 02/16/23 regulatory import     History of gastric ulcer    History of kidney stones    Hyperlipidemia    IBS (irritable bowel syndrome)    Lower extremity edema 07/26/2021   Lower extremity edema     Migraines    neurologist-  dr c. Sharene Skeans ([facility] in [place])-- treated with Emgality injection every 30 days   Mild obstructive sleep apnea    per study in epic 03-21-2017 mild osa, recommendation cpap, mouth appliance, wt loss   Nausea and vomiting 12/03/2016   OAB (overactive bladder) 08/12/2017   Osteoarthritis    right ankle   Osteochondral lesion 09/2016   right ankle   Osteomyelitis of knee region [facility])    Peripheral vascular disease (HCC)    NERVE DAMAGE RIGHT LEG AND FOOT DUE TO INJURGY.    Plantar fasciitis of left foot    Pleural effusion 12/15/2013   PONV (postoperative nausea and vomiting)    AND HEADACHES   Pseudotumor cerebri    at back head   RUQ pain    S/P dilatation of  esophageal stricture 07/2018   Sinusitis 08/12/2018   RESOLVED WITH ANTIOBIOTIC   SUI (stress urinary incontinence, female)    Wears contact lenses    Wears partial dentures    upper   Past Surgical History:  Procedure Laterality Date   ANKLE ARTHROSCOPY Right 10/22/2016   Procedure: ANKLE ARTHROSCOPY;  Surgeon: Vivi Barrack, DPM;  Location: [facility];  Service: Podiatry;  Laterality: Right;  GENERAL/REG BLOCK   ANKLE ARTHROSCOPY Right 09-15-2017   @[facility]    BLADDER SURGERY  child   x 2 - as a child to stretch bladder   CESAREAN SECTION  12/24/2009   CESAREAN SECTION  11/20/2011   Procedure: CESAREAN SECTION;  Surgeon: Lenoard Aden, MD;  Location: [facility];  Service: Gynecology;  Laterality: N/A;   CHOLECYSTECTOMY N/A 12/03/2016   Procedure: LAPAROSCOPIC CHOLECYSTECTOMY WITH INTRAOPERATIVE CHOLANGIOGRAM;  Surgeon: Darnell Level, MD;  Location: [facility];  Service: General;  Laterality: N/A;   CHONDROPLASTY Right 01/12/2024   Procedure: RIGHT KNEE ARTHROSCOPY LATERAL FEMORAL CHONDROPLASTY;  Surgeon: [name], MD;  Location: [facility];  Service: Orthopedics;  Laterality: Right;   CYSTOSCOPY WITH RETROGRADE PYELOGRAM, URETEROSCOPY AND STENT PLACEMENT Left 08/10/2006   ESOPHAGOGASTRODUODENOSCOPY (EGD) WITH ESOPHAGEAL DILATION  07/2018   HARDWARE REMOVAL Right 07/01/2016   Procedure: HARDWARE REMOVAL;  Surgeon: Cammy Copa, MD;  Location: [facility];  Service: Orthopedics;  Laterality: Right;   HARVEST BONE GRAFT Right 01/12/2024   Procedure: RIGHT BONE MARROW ASPIRATE FROM ILIAC CREST DECOMPRESSION AND PLACEMENT RIGHT LATERAL FEMORAL CONDYLE;  Surgeon: [name], MD;  Location: [facility];  Service: Orthopedics;  Laterality: Right;   KNEE ARTHROSCOPY WITH LATERAL RELEASE Right 12/13/2014   Procedure: KNEE ARTHROSCOPY WITH LATERAL RELEASE;  Surgeon: Thera Flake., MD;  Location: [facility];  Service: Orthopedics;   Laterality: Right;   LAPAROSCOPIC APPENDECTOMY N/A 06/11/2014   Procedure: APPENDECTOMY LAPAROSCOPIC;  Surgeon: Velora Heckler, MD;  Location: [facility];  Service: General;  Laterality: N/A;   ORIF ANKLE FRACTURE Right 01/05/2016   Procedure: OPEN REDUCTION INTERNAL FIXATION (ORIF) ANKLE FRACTURE;  Surgeon: Cammy Copa, MD;  Location: [facility];  Service: Orthopedics;  Laterality: Right;   OVARIAN CYST SURGERY Right 03/05/2007   AND CHROMOPERTUBATION  VIA LAPAROSCOPY   ROBOTIC ASSISTED LAPAROSCOPIC HYSTERECTOMY AND SALPINGECTOMY Bilateral 08/27/2018   Procedure: XI ROBOTIC ASSISTED LAPAROSCOPIC HYSTERECTOMY AND BILATERAL SALPINGECTOMY;  Surgeon: Olivia Mackie, MD;  Location: [facility];  Service: Gynecology;  Laterality: Bilateral;  [facility] for 23hr OBS   SYNOVECTOMY Right 12/13/2014   Procedure: PLICA SYNOVECTOMY;  Surgeon: Thera Flake., MD;  Location: [facility];  Service: Orthopedics;  Laterality: Right;   TONSILLECTOMY AND ADENOIDECTOMY  child   WISDOM TOOTH EXTRACTION     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 01/12/2024   AVN (avascular necrosis of bone) (HCC) 01/12/2024   Prediabetes 07/14/2023   Weight disorder 07/14/2023   Ankle swelling 07/14/2023   Menopausal syndrome (hot flushes) 07/13/2023   Hyperlipidemia 06/19/2023   Intractable pain 06/17/2023   Chronic constipation 06/17/2023   Other idiopathic scoliosis, lumbar region 06/17/2023   Dysphagia 08/12/2017   Osteochondral lesion 06/03/2017   OSA (obstructive sleep apnea) 03/24/2017   Ankle pain, chronic 01/05/2016   Knee pain, right, chronic 04/12/2015   Chronic migraine without aura 04/01/2013   DEPRESSION, ACUTE 10/02/2008   Opioid use disorder in remission 10/02/2008   back pain, chronic 03/28/2008   Obesity (BMI 30-39.9) 01/25/2008   Anxiety state 11/09/2007    PCP: Glenetta Hew MD  REFERRING PROVIDER: [name] MD  REFERRING DIAG:  RIGHT KNEE ARTHROSCOPY LATERAL FEMORAL CHONDROPLASTYRightGeneral RIGHT BONE MARROW ASPIRATE FROM ILIAC CREST DECOMPRESSION AND PLACEMENT RIGHT LATERAL FEMORAL CONDYLE  THERAPY DIAG:  No diagnosis found.  Rationale for Evaluation and Treatment: Rehabilitation  ONSET DATE: 01/12/2024  SUBJECTIVE:   SUBJECTIVE STATEMENT: Pt is 6 weeks and 1 day s/p Right knee arthroscopy with lateral femoral condyle chondroplasty, core decompression, sequestrectomy right knee lateral femoral condyle, and Right iliac crest bone marrow aspiration.    Pt has been absent from PT due to being sick and having sick children. Pt stopped using the crutches 4 days ago and is not using the brace at home.   Pt states she feels good walking without crutches.  She still feels like it wants to give out every once in a while.  Pt reports improved mobility and she is able to do more.  Pt drove her kids to scholl for the past 2 days and felt fine. Pt saw MD today and he told her to not use the brace at home, but to use the brace  outside of home.  She continues to have numbness in R foot and MD ordered a nerve study.  Pt is having pain, grinding, and crunching in L knee.  She had an injection in her L knee today and reports pain has already improved.  Pt is still having lumbar pain.   Pt states she has a migraine today.   PERTINENT HISTORY: MD note on 3/12 indicated pt to be WBAT and she should be in her brace for 2 additional weeks.   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  PAIN:  Are you having pain? Yes: NPRS scale:  4-5/10  /   3/10  Pain location: anterior R knee    /   lumbar Pain description: shooting  Aggravating factors:standing and walking  Relieving factors: rest   PRECAUTIONS: None  RED FLAGS: None   WEIGHT BEARING RESTRICTIONS: Yes  WBAT   FALLS:  Has patient fallen in last 6 months? Yes. Number of falls fallen a few times. Not sure why   LIVING ENVIRONMENT: Lives with:  At moms right now 3 into that house   10 steps at her house to get in and a second floor at home.   OCCUPATION:  Stay at home mom  Hobbies:  Working out  Walking     PLOF: Independent  PATIENT GOALS:  To be able to walk and work out    NEXT MD VISIT:  Wednesday next week.   OBJECTIVE:  Note: Objective measures were completed at Evaluation unless otherwise noted.  DIAGNOSTIC FINDINGS: nothing post op     LOWER EXTREMITY ROM:  Passive ROM Right eval Left eval Left 3/17 Left 3/25 Left 4/9  Hip flexion       Hip extension       Hip abduction       Hip adduction       Hip internal rotation       Hip external rotation       Knee flexion  86 111 AAROM 123 AAROM 120 AROM  Knee extension  -5 0    Ankle dorsiflexion       Ankle plantarflexion       Ankle inversion       Ankle eversion        (Blank rows = not tested)                                                                                                                                TREATMENT: Reviewed pt presentation, response to prior Rx, and pain levels.  Gait:  Pt ambulates well with a heel to toe gait with brace at 0-90 deg and without any crutches.  Standing weight shifts in brace with bilat UE support on rail s/s and f/b x 10  reps each  OBSERVATION:  PT observed R hip and R knee.  Incisions are intact and dry with some scabbing.  The spots/abrasions above the knee incisions/portals are healing well.       Reviewed HEP. Pt received R knee flexion and extension PROM per pt and tissue tolerance. Assessed ROM  Supine SLR 2x10 Supine Heel slides with strap x 10 reps Supine heel slides x10 SAQ 2x10 S/L hip abd 2x10 LAQ x 4 reps  Pt received a HEP handout and was educated in correct form and appropriate frequency.        PATIENT EDUCATION:   Education details: HEP, symptom management, exercise form, relevant anatomy, ROM findings, POC, and PT answered pt's questions.  Person educated: Patient Education method: Explanation, Demonstration, Tactile cues, Verbal cues, and Handouts Education comprehension: verbalized understanding, returned demonstration, verbal cues required, tactile cues required, and needs further education  HOME EXERCISE PROGRAM: Access Code: [email or web address] URL: [email or web address] Date: 01/26/2024 Prepared by: Lorayne Bender  Updated HEP: - Supine Active Straight Leg Raise  - 1 x daily - 6-7 x weekly - 2 sets - 10 reps - Supine Short Arc Quad  - 1 x daily - 6-7 x weekly - 2 sets - 10 reps - Supine Heel Slide  - 2 x daily - 7 x weekly - 1-2 sets - 10 reps    ASSESSMENT:  CLINICAL IMPRESSION: Pt presents to Rx without crutches and saw MD this AM. Pt demonstrates improved quad strength being able to perform supine SLR well independently.     No increased pain   continues to have lumbar pain and had a lumbar injection yesterday.  Pt presents to Rx with bilat crutches and knee brace unlocked from 0-90 deg.  PT had pt ambulate with 1 crutch and the brace and instructed her in proper sequencing and gait pattern.  Pt did well ambulating with 1 crutch and had no instability.  PT observed incision sites.  Pt is still using the cream for the rash and her skin continues to improve.  PT performed knee PROM and she tolerated PROM well.  Pt is making good progress with knee ROM and demonstrates improved flexion ROM as evidenced by goniometric measurements.  PT had pt try supine SLR and pt was unable to perform supine SLR with and without brace.  Pt tolerated Rx well and had no c/o's after Rx.  She should benefit from continued skilled PT to address impairments and goals and to assist in restoring desired level of function.               OBJECTIVE IMPAIRMENTS: Abnormal gait, decreased activity tolerance,  decreased endurance, decreased knowledge of use of DME, decreased mobility, difficulty walking, decreased ROM, decreased strength, increased edema, and pain.   ACTIVITY LIMITATIONS: carrying, lifting, bending, standing, squatting, sleeping, stairs, transfers, dressing, locomotion level, and caring for others  PARTICIPATION LIMITATIONS: meal prep, cleaning, laundry, driving, shopping, community activity, and yard work  PERSONAL FACTORS: 1-2 comorbidities: right ankle ORIF/ Lumbar spine pain    are also affecting patient's functional outcome.   REHAB POTENTIAL: Good  CLINICAL DECISION MAKING: Evolving/moderate complexity lumbar spine pain decreasing overall mobility   EVALUATION COMPLEXITY: Low   GOALS: Goals reviewed with patient? Yes  SHORT TERM GOALS: Target date: 02/23/2024   Patient will increase passive right knee flexion to 120 degrees Baseline: Goal status: INITIAL  2.  Patient will report a 50% reduction in pain in her low back  Baseline:  Goal status: INITIAL  3.  Patient will progress off assistive device Baseline:  Goal status: INITIAL  4.  Patient will be independent with initial HEP  Baseline:  Goal status: INITIAL   LONG TERM GOALS: Target date: 03/22/2024    Patient will ambulate community distances without increased pain  Baseline:  Goal status: INITIAL  2.  Patient will go up/down 12 steps without increased pain in order to get to her bedroom  Baseline:  Goal status: INITIAL  3.  Patient will squat to pick item off the goound without pain  Baseline:  Goal status: INITIAL  4.  Patient will have a complete gym and home program  Baseline:  Goal status: INITIAL  PLAN:  PT FREQUENCY: 2x/week  PT DURATION: 8 weeks  PLANNED INTERVENTIONS: 97110-Therapeutic exercises, 97530- Therapeutic activity, [id number]- Neuromuscular re-education, 97535- Self Care, [id number]- Manual therapy, [id number]- Gait training, [phone]- Aquatic Therapy, 97014- Electrical stimulation  (unattended), 97035- Ultrasound, Patient/Family education, Stair training, Taping, Dry Needling, DME instructions, Cryotherapy, and Moist heat  PLAN FOR NEXT SESSION:  Progress per patient tolerance. Progress weight bearing next visit. Continue with ROM as tolerated.  STM to lumbar spine. Patient will have an MRI of her spine soon.    Audie Clear III PT, DPT 02/24/24 12:05 PM

## 2024-02-24 NOTE — Progress Notes (Signed)
 Post Operative Evaluation    Procedure/Date of Surgery: Right knee arthroscopy with lateral femoral chondroplasty, decompression 2/25  Interval History:    Presents today 6 weeks status post the above procedure.  Overall she is still experiencing numbness in the 1st through 3rd toes.  She is also having left knee pain today.  This has been causing significant pain with occasional swelling and crepitus.   PMH/PSH/Family History/Social History/Meds/Allergies:    Past Medical History:  Diagnosis Date  . ADD (attention deficit disorder with hyperactivity)   . Allergy   . Anxiety   . Arthritis   . Atypical chest pain 07/26/2021   Atypical chest pain    . Benign intracranial hypertension 06/16/2008   Qualifier: Diagnosis of   By: Zimonjic, Milica        . Choking    due to food and pills get stuck  . Cholelithiasis and acute cholecystitis without obstruction 12/02/2016  . Chronic cough 12/27/2013  . Depression   . Dry mouth 08/12/2017  . Dysmenorrhea   . Family history of adverse reaction to anesthesia    pt's father has hx. of being hard to wake up post-op  . Gastric ulcer 03/16/2015  . GERD (gastroesophageal reflux disease)   . Headache 07/09/2007   Qualifier: Diagnosis of   By: Cato Mulligan MD, Bruce      Replacing diagnoses that were inactivated after the 02/16/23 regulatory import    . History of gastric ulcer   . History of kidney stones   . Hyperlipidemia   . IBS (irritable bowel syndrome)   . Lower extremity edema 07/26/2021   Lower extremity edema    . Migraines    neurologist-  dr c. Sharene Skeans ([facility] in [place])-- treated with Emgality injection every 30 days  . Mild obstructive sleep apnea    per study in epic 03-21-2017 mild osa, recommendation cpap, mouth appliance, wt loss  . Nausea and vomiting 12/03/2016  . OAB (overactive bladder) 08/12/2017  . Osteoarthritis    right ankle  . Osteochondral lesion 09/2016    right ankle  . Osteomyelitis of knee region [facility])   . Peripheral vascular disease (HCC)    NERVE DAMAGE RIGHT LEG AND FOOT DUE TO INJURGY.   Marland Kitchen Plantar fasciitis of left foot   . Pleural effusion 12/15/2013  . PONV (postoperative nausea and vomiting)    AND HEADACHES  . Pseudotumor cerebri    at back head  . RUQ pain   . S/P dilatation of esophageal stricture 07/2018  . Sinusitis 08/12/2018   RESOLVED WITH ANTIOBIOTIC  . SUI (stress urinary incontinence, female)   . Wears contact lenses   . Wears partial dentures    upper   Past Surgical History:  Procedure Laterality Date  . ANKLE ARTHROSCOPY Right 10/22/2016   Procedure: ANKLE ARTHROSCOPY;  Surgeon: Vivi Barrack, DPM;  Location: [facility];  Service: Podiatry;  Laterality: Right;  GENERAL/REG BLOCK  . ANKLE ARTHROSCOPY Right 09-15-2017   @[facility]   . BLADDER SURGERY  child   x 2 - as a child to stretch bladder  . CESAREAN SECTION  12/24/2009  . CESAREAN SECTION  11/20/2011   Procedure: CESAREAN SECTION;  Surgeon: Lenoard Aden, MD;  Location: [facility];  Service: Gynecology;  Laterality: N/A;  . CHOLECYSTECTOMY N/A 12/03/2016  Procedure: LAPAROSCOPIC CHOLECYSTECTOMY WITH INTRAOPERATIVE CHOLANGIOGRAM;  Surgeon: Darnell Level, MD;  Location: [facility];  Service: General;  Laterality: N/A;  . CHONDROPLASTY Right 01/12/2024   Procedure: RIGHT KNEE ARTHROSCOPY LATERAL FEMORAL CHONDROPLASTY;  Surgeon: [name], MD;  Location: [facility];  Service: Orthopedics;  Laterality: Right;  . CYSTOSCOPY WITH RETROGRADE PYELOGRAM, URETEROSCOPY AND STENT PLACEMENT Left 08/10/2006  . ESOPHAGOGASTRODUODENOSCOPY (EGD) WITH ESOPHAGEAL DILATION  07/2018  . HARDWARE REMOVAL Right 07/01/2016   Procedure: HARDWARE REMOVAL;  Surgeon: Cammy Copa, MD;  Location: [facility];  Service: Orthopedics;  Laterality: Right;  . HARVEST BONE GRAFT Right 01/12/2024   Procedure: RIGHT BONE MARROW ASPIRATE FROM ILIAC CREST DECOMPRESSION AND  PLACEMENT RIGHT LATERAL FEMORAL CONDYLE;  Surgeon: [name], MD;  Location: [facility];  Service: Orthopedics;  Laterality: Right;  . KNEE ARTHROSCOPY WITH LATERAL RELEASE Right 12/13/2014   Procedure: KNEE ARTHROSCOPY WITH LATERAL RELEASE;  Surgeon: Thera Flake., MD;  Location: [facility];  Service: Orthopedics;  Laterality: Right;  . LAPAROSCOPIC APPENDECTOMY N/A 06/11/2014   Procedure: APPENDECTOMY LAPAROSCOPIC;  Surgeon: Velora Heckler, MD;  Location: [facility];  Service: General;  Laterality: N/A;  . ORIF ANKLE FRACTURE Right 01/05/2016   Procedure: OPEN REDUCTION INTERNAL FIXATION (ORIF) ANKLE FRACTURE;  Surgeon: Cammy Copa, MD;  Location: [facility];  Service: Orthopedics;  Laterality: Right;  . OVARIAN CYST SURGERY Right 03/05/2007   AND CHROMOPERTUBATION  VIA LAPAROSCOPY  . ROBOTIC ASSISTED LAPAROSCOPIC HYSTERECTOMY AND SALPINGECTOMY Bilateral 08/27/2018   Procedure: XI ROBOTIC ASSISTED LAPAROSCOPIC HYSTERECTOMY AND BILATERAL SALPINGECTOMY;  Surgeon: Olivia Mackie, MD;  Location: [facility];  Service: Gynecology;  Laterality: Bilateral;  [facility] for 23hr OBS  . SYNOVECTOMY Right 12/13/2014   Procedure: PLICA SYNOVECTOMY;  Surgeon: Thera Flake., MD;  Location: [facility];  Service: Orthopedics;  Laterality: Right;  . TONSILLECTOMY AND ADENOIDECTOMY  child  . WISDOM TOOTH EXTRACTION     Social History   Socioeconomic History  . Marital status: Married    Spouse name: Not on file  . Number of children: 2  . Years of education: Not on file  . Highest education level: Not on file  Occupational History  . Occupation: Investment banker, 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  Tobacco Use  . Smoking status: Former    Current packs/day: 0.50    Average packs/day: 0.5 packs/day for 15.0 years (7.5 ttl pk-yrs)    Types: Cigarettes  . Smokeless tobacco: Never  Vaping Use  . Vaping status: Every Day  . Substances: Nicotine  Substance  and Sexual Activity  . Alcohol use: Yes    Comment: occasional  . Drug use: No  . Sexual activity: Yes    Birth control/protection: Surgical    Comment: partial hysterectomy  Other Topics Concern  . Not on file  Social History Narrative  . Not on file   Social Drivers of Health   Financial Resource Strain: Not on file  Food Insecurity: Not on file  Transportation Needs: Not on file  Physical Activity: Not on file  Stress: Not on file  Social Connections: Unknown (03/26/2022)   Received from [facility], [facility]   Social Network   . Social Network: Not on file   Family History  Problem Relation Age of Onset  . Learning disabilities Mother   . Asthma Mother   . Arthritis Mother   . Irritable bowel syndrome Mother   .  Miscarriages / [place] Mother   . Hypertension Father   . Early death Father   . Drug abuse Father   . Depression Father   . Cancer Father   . Arthritis Father   . Alcohol abuse Father   . Brain cancer Father   . Anesthesia problems Father        hard to wake up post-op  . Lung cancer Father   . Hyperlipidemia Maternal Grandmother   . Hypertension Maternal Grandmother   . Cancer Maternal Grandmother   . Asthma Maternal Grandmother   . Arthritis Maternal Grandmother   . Breast cancer Maternal Grandmother   . Ovarian cancer Maternal Grandmother   . Diabetes Maternal Grandmother   . Heart disease Maternal Grandmother   . Heart attack Maternal Grandmother   . Hypertension Maternal Grandfather   . Hyperlipidemia Maternal Grandfather   . Arthritis Maternal Grandfather   . Alcohol abuse Maternal Grandfather   . Cancer Maternal Grandfather   . Prostate cancer Maternal Grandfather   . Melanoma Maternal Grandfather   . Cancer - Prostate Maternal Grandfather   . Hypertension Paternal Grandmother   . COPD Paternal Grandmother   . Arthritis Paternal Grandmother   . Learning disabilities Daughter   . Learning disabilities Son   . Colon cancer  Neg Hx    Allergies  Allergen Reactions  . Metoclopramide Other (See Comments)    Reglan  . Nickel Rash    "any jewelry that's not real"/ rash and itching   . Prochlorperazine Other (See Comments)  . Nsaids Other (See Comments)    DUE TO HISTORY OF GASTRIC ULCER  . Tolmetin Itching and Other (See Comments)    DUE TO HISTORY OF GASTRIC ULCER  DUE TO HISTORY OF GASTRIC ULCER  DUE TO HISTORY OF GASTRIC ULCER, DUE TO HISTORY OF GASTRIC ULCER  . Oxybutynin Other (See Comments)    "severe dry mouth"  . Adhesive [Tape] Rash  . Metoclopramide Hcl Other (See Comments)    RESTLESSNESS/JITTERY  . Prochlorperazine Edisylate Other (See Comments)    RESTLESSNESS/JITTERY   . Sulfa Antibiotics Rash   Current Outpatient Medications  Medication Sig Dispense Refill  . ADDERALL XR 20 MG 24 hr capsule Take 20 mg by mouth every morning.  0  . albuterol (PROVENTIL HFA;VENTOLIN HFA) 108 (90 Base) MCG/ACT inhaler Inhale 2 puffs into the lungs every 6 (six) hours as needed for wheezing or shortness of breath.     . ALPRAZolam (XANAX) 0.25 MG tablet SMARTSIG:0.5 Tablet(s) By Mouth PRN    . aspirin EC 81 MG tablet Take 1 tablet (81 mg total) by mouth daily. Swallow whole. 14 tablet 0  . atorvastatin (LIPITOR) 40 MG tablet Take 1 tablet (40 mg total) by mouth daily. 15 tablet 0  . Bacillus Coagulans-Inulin (PROBIOTIC) 1-250 BILLION-MG CAPS     . cetirizine (ZYRTEC) 10 MG tablet Take by mouth.    . diclofenac Sodium (VOLTAREN) 1 % GEL APPLY TO AFFECTED AREA AS INSTRUCTED    . EMGALITY 120 MG/ML SOAJ Inject 120 mg into the skin every 30 (thirty) days.  4  . estradiol (VIVELLE-DOT) 0.05 MG/24HR patch Place 1 patch (0.05 mg total) onto the skin 2 (two) times a week. 8 patch 12  . Fezolinetant (VEOZAH) 45 MG TABS Take 1 tablet (45 mg total) by mouth daily at 6 (six) AM. 30 tablet 11  . fluticasone (FLONASE) 50 MCG/ACT nasal spray Place 2 sprays into both nostrils daily. 16 g 6  . fluticasone (  FLOVENT HFA) 110  MCG/ACT inhaler Inhale into the lungs.    . gabapentin (NEURONTIN) 300 MG capsule TAKE 2 CAPSULES BY MOUTH 3 TIMES DAILY (Patient taking differently: Take 600-1,200 mg by mouth See admin instructions. Take 600mg  by mouth in the morning and 1,200mg  in the evening) 180 capsule 4  . ketoconazole (NIZORAL) 2 % cream Apply 1 Application topically daily. 15 g 0  . lubiprostone (AMITIZA) 24 MCG capsule Take 24 mcg by mouth 2 (two) times daily.    . magnesium oxide (MAG-OX) 400 MG tablet Take by mouth.    . mirtazapine (REMERON) 30 MG tablet Take 30 mg by mouth daily.    . Multiple Vitamins-Minerals (EQL CENTURY WOMENS) TABS     . NUCYNTA 100 MG TABS Take 1 tablet by mouth every 8 (eight) hours as needed.    Marland Kitchen omeprazole (PRILOSEC) 40 MG capsule Take 40 mg by mouth daily.    . ondansetron (ZOFRAN-ODT) 8 MG disintegrating tablet Take 1 tablet by mouth every 8 (eight) hours as needed.    . pantoprazole (PROTONIX) 20 MG tablet Take 20 mg by mouth 2 (two) times daily.    . promethazine (PHENERGAN) 25 MG tablet Take 1 tablet (25 mg total) by mouth every 8 (eight) hours as needed for nausea or vomiting. 10 tablet 0  . Rimegepant Sulfate (NURTEC) 75 MG TBDP Nurtec ODT 75 mg disintegrating tablet  TAKE ONE TABLET BY MOUTH AS NEEDED. one PER 24 hours    . Saline (SIMPLY SALINE) 0.9 % AERS Place 2 each into the nose as directed. Use nightly for sinus hygiene long-term.  Can also be used as many times daily as desired to assist with clearing congested sinuses. 127 mL 11  . senna-docusate (SENOKOT-S) 8.6-50 MG tablet Take 1 tablet by mouth daily. 10 tablet 0  . tirzepatide (ZEPBOUND) 2.5 MG/0.5ML Pen Inject 2.5 mg into the skin once a week. 2 mL 11  . tiZANidine (ZANAFLEX) 4 MG tablet Take 4 mg by mouth every 6 (six) hours as needed for muscle spasms.     . UBRELVY 100 MG TABS Take by mouth.    . venlafaxine XR (EFFEXOR-XR) 150 MG 24 hr capsule Take 2 capsules (300 mg total) by mouth daily before breakfast. 60  capsule 11  . zonisamide (ZONEGRAN) 100 MG capsule Take 100 mg by mouth at bedtime.     No current facility-administered medications for this visit.   No results found.  Review of Systems:   A ROS was performed including pertinent positives and negatives as documented in the HPI.   Musculoskeletal Exam:    Last menstrual period 08/26/2018.  Right knee incisions are well-appearing without erythema or drainage.  Trace effusion.  Range of motion is from 0 to 120 degrees.  No joint line tenderness.  Left knee with patellofemoral crepitus.  Range of motion is from 0-130, negative McMurray, negative Lachman  Imaging:      I personally reviewed and interpreted the radiographs.   Assessment:   6 weeks status post right knee arthroscopy with lateral femoral condyle abrasion arthroplasty and core decompression overall doing well.  She is having some right 1st through 3rd toe numbness which I do believe may be evidence of peroneal nerve entrapment.  This time we will plan for an EMG nerve conduction test to assess this.  Regard to the left knee I have also recommended ultrasound-guided injection in order to get her some relief while she is continuing to use this side to  rehab  Plan :    - Left knee ultrasound-guided injection provided after verbal consent obtained    Procedure Note  Patient: Diane [name]             Date of Birth: 1981-06-12           MRN: [id number]             Visit Date: 02/24/2024  Procedures: Visit Diagnoses: No diagnosis found.  Large Joint Inj: L knee on 02/24/2024 12:28 PM Indications: pain Details: 22 G 1.5 in needle, ultrasound-guided anterior approach  Arthrogram: No  Medications: 4 mL lidocaine 1 %; 80 mg triamcinolone acetonide 40 MG/ML Outcome: tolerated well, no immediate complications Procedure, treatment alternatives, risks and benefits explained, specific risks discussed. Consent was given by the patient. Immediately prior to procedure a  time out was called to verify the correct patient, procedure, equipment, support staff and site/side marked as required. Patient was prepped and draped in the usual sterile fashion.           I personally saw and evaluated the patient, and participated in the management and treatment plan.  [name], MD Attending Physician, Orthopedic Surgery  This document was dictated using Dragon voice recognition software. A reasonable attempt at proof reading has been made to minimize errors.

## 2024-02-29 ENCOUNTER — Encounter (HOSPITAL_BASED_OUTPATIENT_CLINIC_OR_DEPARTMENT_OTHER): Payer: Self-pay | Admitting: Orthopaedic Surgery

## 2024-02-29 ENCOUNTER — Ambulatory Visit (HOSPITAL_BASED_OUTPATIENT_CLINIC_OR_DEPARTMENT_OTHER): Payer: Self-pay | Admitting: Physical Therapy

## 2024-02-29 ENCOUNTER — Emergency Department (HOSPITAL_BASED_OUTPATIENT_CLINIC_OR_DEPARTMENT_OTHER)
Admission: EM | Admit: 2024-02-29 | Discharge: 2024-03-01 | Disposition: A | Discharge status: Home / Self Care | Attending: Emergency Medicine | Admitting: Emergency Medicine

## 2024-02-29 ENCOUNTER — Encounter (HOSPITAL_BASED_OUTPATIENT_CLINIC_OR_DEPARTMENT_OTHER): Payer: Self-pay | Admitting: Physical Therapy

## 2024-02-29 ENCOUNTER — Encounter (HOSPITAL_BASED_OUTPATIENT_CLINIC_OR_DEPARTMENT_OTHER): Payer: Self-pay

## 2024-02-29 ENCOUNTER — Emergency Department (HOSPITAL_BASED_OUTPATIENT_CLINIC_OR_DEPARTMENT_OTHER): Admitting: Radiology

## 2024-02-29 ENCOUNTER — Other Ambulatory Visit: Payer: Self-pay

## 2024-02-29 DIAGNOSIS — R519 Headache, unspecified: Secondary | ICD-10-CM | POA: Diagnosis present

## 2024-02-29 DIAGNOSIS — Z7982 Long term (current) use of aspirin: Secondary | ICD-10-CM | POA: Insufficient documentation

## 2024-02-29 DIAGNOSIS — M25562 Pain in left knee: Secondary | ICD-10-CM

## 2024-02-29 LAB — CBC
HCT: 38.6 % (ref 36.0–46.0)
Hemoglobin: 12.6 g/dL (ref 12.0–15.0)
MCH: 28.1 pg (ref 26.0–34.0)
MCHC: 32.6 g/dL (ref 30.0–36.0)
MCV: 86.2 fL (ref 80.0–100.0)
Platelets: 313 10*3/uL (ref 150–400)
RBC: 4.48 MIL/uL (ref 3.87–5.11)
RDW: 13.9 % (ref 11.5–15.5)
WBC: 8.9 10*3/uL (ref 4.0–10.5)
nRBC: 0 % (ref 0.0–0.2)

## 2024-02-29 LAB — BASIC METABOLIC PANEL WITH GFR
Anion gap: 10 (ref 5–15)
BUN: 9 mg/dL (ref 6–20)
CO2: 26 mmol/L (ref 22–32)
Calcium: 8.8 mg/dL — ABNORMAL LOW (ref 8.9–10.3)
Chloride: 103 mmol/L (ref 98–111)
Creatinine, Ser: 0.71 mg/dL (ref 0.44–1.00)
GFR, Estimated: >60 mL/min (ref >60)
Glucose, Bld: 112 mg/dL — ABNORMAL HIGH (ref 70–99)
Potassium: 3.5 mmol/L (ref 3.5–5.1)
Sodium: 139 mmol/L (ref 135–145)

## 2024-02-29 LAB — TROPONIN I (HIGH SENSITIVITY): Troponin I (High Sensitivity): 3 ng/L (ref <18)

## 2024-02-29 LAB — TSH: TSH: 1.09 u[IU]/mL (ref 0.350–4.500)

## 2024-02-29 MED ORDER — SODIUM CHLORIDE 0.9 % IV BOLUS
1000.0000 mL | Freq: Once | INTRAVENOUS | Status: AC
  Administered 2024-02-29: 1000 mL via INTRAVENOUS

## 2024-02-29 MED ORDER — DIAZEPAM 5 MG/ML IJ SOLN
2.5000 mg | Freq: Once | INTRAMUSCULAR | Status: AC
  Administered 2024-02-29: 2.5 mg via INTRAVENOUS
  Filled 2024-02-29: qty 2

## 2024-02-29 MED ORDER — DROPERIDOL 2.5 MG/ML IJ SOLN
0.6250 mg | Freq: Once | INTRAMUSCULAR | Status: AC
  Administered 2024-02-29: 0.625 mg via INTRAVENOUS
  Filled 2024-02-29: qty 2

## 2024-02-29 NOTE — ED Triage Notes (Signed)
 Patient states she is not feeling right. States she is getting a migraine and felt tired and sluggish. Also felt like she could feel her heart pumping through her next. BP 165/105, HR 96 at home. Felt dizzy and short of breath. Patient does not take BP medication. Patient has knee surgery 6 weeks ago. Takes one daily ASA.

## 2024-02-29 NOTE — ED Provider Notes (Signed)
 [place] EMERGENCY DEPARTMENT AT [facility] Provider Note   CSN: [id number] Arrival date & time: 02/29/24  1908     History {Add pertinent medical, surgical, social history, OB history to HPI:1} Chief Complaint  Patient presents with  . Shortness of Breath  . Headache    Diane [name] is a 43 y.o. female who presents emergency department for a chief complaint of headache.  Patient states she has just "felt off" past several days.  She takes Emgality and went from having 20 headache days a month down to 2 but has a history of migraine headaches since she was 43 years old.  She describes her symptoms today as being different from her typical migraine with throbbing in her head, light sevens activity and vertiginous symptoms.  She denies gait instability   Shortness of Breath Associated symptoms: headaches   Headache      Home Medications Prior to Admission medications   Medication Sig Start Date End Date Taking? Authorizing Provider  ADDERALL XR 20 MG 24 hr capsule Take 20 mg by mouth every morning. 07/09/18  Yes [provider]  albuterol  (PROVENTIL  HFA;VENTOLIN  HFA) 108 (90 Base) MCG/ACT inhaler Inhale 2 puffs into the lungs every 6 (six) hours as needed for wheezing or shortness of breath.    Yes [provider]  ALPRAZolam (XANAX) 0.25 MG tablet SMARTSIG:0.5 Tablet(s) By Mouth PRN 07/17/21  Yes [provider]  aspirin  EC 81 MG tablet Take 1 tablet (81 mg total) by mouth daily. Swallow whole. 12/18/23  Yes Wilhelmenia Harada, MD  atorvastatin  (LIPITOR) 40 MG tablet Take 1 tablet (40 mg total) by mouth daily. 12/23/23 12/17/24 Yes Avanell Leigh, MD  Bacillus Coagulans-Inulin (PROBIOTIC) 1-250 BILLION-MG CAPS    Yes [provider]  cetirizine (ZYRTEC) 10 MG tablet Take by mouth. 06/16/17  Yes [provider]  diclofenac  Sodium (VOLTAREN ) 1 % GEL APPLY TO AFFECTED AREA AS INSTRUCTED   Yes [provider]  EMGALITY 120  MG/ML SOAJ Inject 120 mg into the skin every 30 (thirty) days. 08/02/18  Yes [provider]  estradiol  (VIVELLE -DOT) 0.05 MG/24HR patch Place 1 patch (0.05 mg total) onto the skin 2 (two) times a week. 02/04/24  Yes Anthon Kins, MD  Fezolinetant  (VEOZAH ) 45 MG TABS Take 1 tablet (45 mg total) by mouth daily at 6 (six) AM. 02/02/24  Yes Anthon Kins, MD  fluticasone  (FLONASE ) 50 MCG/ACT nasal spray Place 2 sprays into both nostrils daily. 09/09/23  Yes Anthon Kins, MD  gabapentin  (NEURONTIN ) 300 MG capsule TAKE 2 CAPSULES BY MOUTH 3 TIMES DAILY Patient taking differently: Take 600-1,200 mg by mouth See admin instructions. Take 600mg  by mouth in the morning and 1,200mg  in the evening 05/26/17  Yes Donley Furth, MD  magnesium  oxide (MAG-OX) 400 MG tablet Take by mouth. 06/16/17  Yes [provider]  mirtazapine (REMERON) 30 MG tablet Take 30 mg by mouth daily. 09/19/22  Yes [provider]  Multiple Vitamins-Minerals (EQL CENTURY WOMENS) TABS    Yes [provider]  NUCYNTA 100 MG TABS Take 1 tablet by mouth every 8 (eight) hours as needed.   Yes [provider]  omeprazole (PRILOSEC) 40 MG capsule Take 40 mg by mouth daily. 06/06/21  Yes [provider]  ondansetron  (ZOFRAN -ODT) 8 MG disintegrating tablet Take 1 tablet by mouth every 8 (eight) hours as needed.   Yes [provider]  pantoprazole  (PROTONIX ) 20 MG tablet Take 20 mg by  mouth 2 (two) times daily.   Yes [provider]  promethazine  (PHENERGAN ) 25 MG tablet Take 1 tablet (25 mg total) by mouth every 8 (eight) hours as needed for nausea or vomiting. 08/14/17  Yes Farris Hong, PA-C  Rimegepant Sulfate (NURTEC) 75 MG TBDP Nurtec ODT 75 mg disintegrating tablet  TAKE ONE TABLET BY MOUTH AS NEEDED. one PER 24 hours 05/17/20  Yes [provider]  Saline (SIMPLY SALINE) 0.9 % AERS Place 2 each into the nose as directed. Use nightly for sinus hygiene  long-term.  Can also be used as many times daily as desired to assist with clearing congested sinuses. 09/09/23  Yes Anthon Kins, MD  senna-docusate (SENOKOT-S) 8.6-50 MG tablet Take 1 tablet by mouth daily. 06/20/23  Yes Prosperi, Christian H, PA-C  tiZANidine  (ZANAFLEX ) 4 MG tablet Take 4 mg by mouth every 6 (six) hours as needed for muscle spasms.  11/18/16  Yes [provider]  UBRELVY 100 MG TABS Take by mouth. 07/19/21  Yes [provider]  venlafaxine  XR (EFFEXOR -XR) 150 MG 24 hr capsule Take 2 capsules (300 mg total) by mouth daily before breakfast. 11/06/16  Yes Donley Furth, MD  zonisamide (ZONEGRAN) 100 MG capsule Take 100 mg by mouth at bedtime. 07/18/22  Yes [provider]  fluticasone  (FLOVENT  HFA) 110 MCG/ACT inhaler Inhale into the lungs. 04/16/21   [provider]  ketoconazole  (NIZORAL ) 2 % cream Apply 1 Application topically daily. 01/27/24   Wilhelmenia Harada, MD  lubiprostone (AMITIZA) 24 MCG capsule Take 24 mcg by mouth 2 (two) times daily.    [provider]  tirzepatide  (ZEPBOUND ) 2.5 MG/0.5ML Pen Inject 2.5 mg into the skin once a week. 02/02/24   Anthon Kins, MD      Allergies    Metoclopramide , Nickel, Prochlorperazine, Nsaids, Tolmetin, Oxybutynin , Adhesive [tape], Metoclopramide  hcl, Prochlorperazine edisylate, and Sulfa antibiotics    Review of Systems   Review of Systems  Respiratory:  Positive for shortness of breath.   Neurological:  Positive for headaches.    Physical Exam Updated Vital Signs BP (!) 165/102 (BP Location: Right Arm)   Pulse 94   Temp 98.2 F (36.8 C) (Oral)   Resp 15   Ht 5\' 7"  (1.702 m)   Wt 111.1 kg   LMP 08/26/2018   SpO2 99%   BMI 38.37 kg/m  Physical Exam Vitals and nursing note reviewed.  Constitutional:      General: She is not in acute distress.    Appearance: She is well-developed. She is not diaphoretic.  HENT:     Head: Normocephalic and atraumatic.     Right Ear:  External ear normal.     Left Ear: External ear normal.     Mouth/Throat:     Pharynx: No oropharyngeal exudate.  Eyes:     General: No scleral icterus.    Extraocular Movements: Extraocular movements intact.     Conjunctiva/sclera: Conjunctivae normal.     Pupils: Pupils are equal, round, and reactive to light.  Neck:     Thyroid : No thyromegaly.     Vascular: No JVD.     Comments: No meningismus Cardiovascular:     Rate and Rhythm: Normal rate and regular rhythm.     Heart sounds: Normal heart sounds. No murmur heard.    No friction rub. No gallop.  Pulmonary:     Effort: Pulmonary effort is normal. No respiratory distress.     Breath sounds: Normal breath sounds.  Abdominal:     General: Bowel sounds are normal. There is no distension.     Palpations: Abdomen is soft. There is no mass.     Tenderness: There is no abdominal tenderness. There is no guarding.  Musculoskeletal:        General: No tenderness. Normal range of motion.     Cervical back: Normal range of motion and neck supple.     Comments: No meningismus  Skin:    General: Skin is warm and dry.     Findings: No rash.  Neurological:     Mental Status: She is alert and oriented to person, place, and time.     Cranial Nerves: No cranial nerve deficit.     Coordination: Coordination normal.     Deep Tendon Reflexes: Reflexes are normal and symmetric.     Comments: Speech is clear and goal oriented, follows commands Major Cranial nerves without deficit, no facial droop Normal strength in upper and lower extremities bilaterally including dorsiflexion and plantar flexion, strong and equal grip strength Sensation normal to light and sharp touch Moves extremities without ataxia, coordination intact Normal finger to nose and rapid alternating movements Neg romberg, no pronator drift Normal gait Normal heel-shin and balance   Psychiatric:        Behavior: Behavior normal.    ED Results / Procedures / Treatments    Labs (all labs ordered are listed, but only abnormal results are displayed) Labs Reviewed  BASIC METABOLIC PANEL WITH GFR - Abnormal; Notable for the following components:      Result Value   Glucose, Bld 112 (*)    Calcium  8.8 (*)    All other components within normal limits  CBC  TROPONIN I (HIGH SENSITIVITY)    EKG None  Radiology No results found.  Procedures Procedures  {Document cardiac monitor, telemetry assessment procedure when appropriate:1}  Medications Ordered in ED Medications - No data to display  ED Course/ Medical Decision Making/ A&P   {   Click here for ABCD2, HEART and other calculatorsREFRESH Note before signing :1}                              Medical Decision Making Amount and/or Complexity of Data Reviewed Labs: ordered. Decision-making details documented in ED Course. Radiology: ordered.  Risk Prescription drug management.    Patient here with hx of Chronic Migraines, near syncope.   Emergent considerations for headache include subarachnoid hemorrhage, meningitis, temporal arteritis, glaucoma, cerebral ischemia, carotid/vertebral dissection, intracranial tumor, Venous sinus thrombosis, carbon monoxide poisoning, acute or chronic subdural hemorrhage.  Other considerations include: Migraine, Cluster headache, Hypertension, Caffeine, alcohol, or drug withdrawal, Pseudotumor cerebri, Arteriovenous malformation, Head injury, Neurocysticercosis, Post-lumbar puncture, Preeclampsia, Tension headache, Sinusitis, Cervical arthritis, Refractive error causing strain, Dental abscess, Otitis media, Temporomandibular joint syndrome, Depression, Somatoform disorder (eg, somatization) Trigeminal neuralgia, Glossopharyngeal neuralgia.  Tx:  Medications  sodium chloride  0.9 % bolus 1,000 mL (1,000 mLs Intravenous New Bag/Given 02/29/24 2320)  droperidol  (INAPSINE ) 2.5 MG/ML injection 0.625 mg (0.625 mg Intravenous Given 02/29/24 2315)  diazepam  (VALIUM ) injection  2.5 mg (2.5 mg Intravenous Given 02/29/24 2317)  diphenhydrAMINE  (BENADRYL ) injection 25 mg (25 mg Intravenous Given 03/01/24 0013)   Patient headache with mild improvement, However unable to tolerate Droperidol  due to akathisia despite IV valium . Patient wishes to be discharged.  Diane [name] presents with headache Given the large differential diagnosis for Diane [name], the decision making in  this case is of high complexity.  After evaluating all of the data points in this case, the presentation of Diane [name] is NOT consistent with skull fracture, meningitis/encephalitis, SAH/sentinel bleed, Intracranial Hemorrhage (ICH) (subdural/epidural), acute obstructive hydrocephalus, space occupying lesions, CVA, CO Poisoning, Basilar/vertebral artery dissection, preeclampsia, cerebral venous thrombosis, hypertensive emergency, temporal Arteritis, Idiopathic Intracranial Hypertension (pseudotumor cerebri).  Strict return and follow-up precautions have been given by me personally or by detailed written instructions verbalized by nursing staff using the teach back method to patient/family/caregiver.  Data Reviewed/Counseling: I have reviewed the patient's vital signs, nursing notes, and other relevant tests/information. I had a detailed discussion regarding the historical points, exam findings, and any diagnostic results supporting the discharge diagnosis. I also discussed the need for outpatient follow-up and the need to return to the ED if symptoms worsen or if there are any questions or concerns that arise at hom  {Document critical care time when appropriate:1} {Document review of labs and clinical decision tools ie heart score, Chads2Vasc2 etc:1}  {Document your independent review of radiology images, and any outside records:1} {Document your discussion with family members, caretakers, and with consultants:1} {Document social determinants of health affecting pt's care:1} {Document your  decision making why or why not admission, treatments were needed:1} Final Clinical Impression(s) / ED Diagnoses Final diagnoses:  None    Rx / DC Orders ED Discharge Orders     None

## 2024-03-01 ENCOUNTER — Telehealth: Payer: Self-pay | Admitting: Internal Medicine

## 2024-03-01 MED ORDER — DIPHENHYDRAMINE HCL 50 MG/ML IJ SOLN
25.0000 mg | Freq: Once | INTRAMUSCULAR | Status: AC
  Administered 2024-03-01: 25 mg via INTRAVENOUS
  Filled 2024-03-01: qty 1

## 2024-03-01 NOTE — Discharge Instructions (Signed)

## 2024-03-01 NOTE — Telephone Encounter (Signed)
 See triage note as FYI

## 2024-03-01 NOTE — ED Notes (Signed)
 Reviewed discharge instructions and follow up care with pt. Pt verbalized understanding and had no further questions. Pt exited ED without complications.

## 2024-03-01 NOTE — Telephone Encounter (Signed)
 Patient called into after hours triage line with c/o lightheadedness, palpitations, and headache. Per note, triage nurse informed patient to got to the ED now. Per chart, patient went to the ED and informed to f/u as needed.         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 Her current BP is 167/105 and her heart rate is 96 resting. She has also been having a headache. Translation No Nurse Assessment Nurse: Mochetta, RN, Loris Ros Date/Time (Eastern Time): 02/29/2024 6:13:24 PM Confirm and document reason for call. If symptomatic, describe symptoms. ---Pt reports she's been feeling flushed in the face, lightheaded, headache, and heart racing for few days now, but worse today. Reports BP 167/105 and HR resting is 96. Pt does not take BP medication. Denies chest pain, but reports she does feel her heart pounding, and she is feeling "trapped gas" right under her breasts. No difficulty breathing. Does the patient have any new or worsening symptoms? ---Yes Will a triage be completed? ---Yes Related visit to physician within the last 2 weeks? ---No Does the PT have any chronic conditions? (i.e. diabetes, asthma, this includes High risk factors for pregnancy, etc.) ---Yes List chronic conditions. ---IBS, migraines Is  the patient pregnant or possibly pregnant? (Ask all females between the ages of 69-55) ---No Is this a behavioral health or substance abuse call? ---No Guidelines Guideline Title Affirmed Question Affirmed Notes Nurse Date/Time (Eastern Time) Dizziness - Lightheadedness Extra heartbeats, irregular heart beating, or heart is beating very fast (i.e., "palpitations") Mochetta, RN, Loris Ros 02/29/2024 6:18:32 PM Disp. Time [facility] Time) Disposition Final User 02/29/2024 6:20:28 PM Go to ED Now (or PCP triage) Yes Adela Ades, RN, Loris Ros Final Disposition 02/29/2024 6:20:28 PM Go to ED Now (or PCP triage) Yes Mochetta, RN, Babara Lerner Disagree/Comply Comply Caller Understands Yes PreDisposition Did not know what to do Care Advice Given Per Guideline GO TO ED/UCC NOW (OR PCP TRIAGE): * IF NO PCP (PRIMARY CARE PROVIDER) SECOND-LEVEL TRIAGE: You need to be seen within the next hour. Go to the ED/UCC at _____________ Hospital. Leave as soon as you can. * It is better and safer if another adult drives instead of you. * Bring a list of your current medicines when you go to the Emergency Department (ER). CARE ADVICE given per Dizziness - Lightheadedness (Adult) guideline.  Referrals  [facility] - ED

## 2024-03-02 ENCOUNTER — Encounter (HOSPITAL_BASED_OUTPATIENT_CLINIC_OR_DEPARTMENT_OTHER): Payer: Self-pay | Admitting: Physical Therapy

## 2024-03-02 ENCOUNTER — Other Ambulatory Visit: Payer: Self-pay | Admitting: Physician Assistant

## 2024-03-02 ENCOUNTER — Telehealth: Payer: Self-pay | Admitting: *Deleted

## 2024-03-02 ENCOUNTER — Encounter: Payer: Self-pay | Admitting: Physician Assistant

## 2024-03-02 ENCOUNTER — Ambulatory Visit (HOSPITAL_BASED_OUTPATIENT_CLINIC_OR_DEPARTMENT_OTHER)
Admission: RE | Admit: 2024-03-02 | Discharge: 2024-03-02 | Disposition: A | Discharge status: Home / Self Care | Source: Ambulatory Visit | Attending: Physician Assistant | Admitting: Physician Assistant

## 2024-03-02 ENCOUNTER — Ambulatory Visit (HOSPITAL_BASED_OUTPATIENT_CLINIC_OR_DEPARTMENT_OTHER): Payer: Self-pay | Admitting: Physical Therapy

## 2024-03-02 ENCOUNTER — Ambulatory Visit: Admitting: Physician Assistant

## 2024-03-02 VITALS — BP 146/96 | HR 92 | Temp 97.3°F | Ht 67.0 in | Wt 252.0 lb

## 2024-03-02 DIAGNOSIS — R7989 Other specified abnormal findings of blood chemistry: Secondary | ICD-10-CM | POA: Diagnosis present

## 2024-03-02 DIAGNOSIS — R0602 Shortness of breath: Secondary | ICD-10-CM | POA: Insufficient documentation

## 2024-03-02 DIAGNOSIS — G43709 Chronic migraine without aura, not intractable, without status migrainosus: Secondary | ICD-10-CM

## 2024-03-02 DIAGNOSIS — R030 Elevated blood-pressure reading, without diagnosis of hypertension: Secondary | ICD-10-CM

## 2024-03-02 DIAGNOSIS — Z79890 Hormone replacement therapy (postmenopausal): Secondary | ICD-10-CM

## 2024-03-02 LAB — D-DIMER, QUANTITATIVE: D-Dimer, Quant: 1.69 ug{FEU}/mL — ABNORMAL HIGH (ref <0.50)

## 2024-03-02 MED ORDER — IOHEXOL 350 MG/ML SOLN
100.0000 mL | Freq: Once | INTRAVENOUS | Status: AC | PRN
Start: 2024-03-02 — End: 2024-03-02
  Administered 2024-03-02: 100 mL via INTRAVENOUS

## 2024-03-02 NOTE — Telephone Encounter (Signed)
 See other message

## 2024-03-02 NOTE — Telephone Encounter (Signed)
Mri placed in chart

## 2024-03-02 NOTE — Progress Notes (Signed)
 Diane [name] is a 43 y.o. female here for a follow up of a pre-existing problem.  History of Present Illness:   Chief Complaint  Patient presents with   Follow-up    Pt was seen in the ED on 4/14 for high blood pressure.   Dizziness    Pt still c/o dizziness and headache, but not a migraine. Bp 165/100? Pt also c/o wheezing in her chest.    Elevated BP  Patient states that she was experiencing light headiness and dizziness over the weekend.  Her BP was 165/105 at that time which caused her to visit the ED.  Chest x-ray done at that time was unremarkable.  ED concluded that her symptoms were caused by a migraine. With that being said, patient did not believe her symptoms were cause by a migraines as she's not having similar symptoms to her typical ones. She has also been wheezing, feeling SOB, and congested but attributes that to allergies  She was placed on propranolol previously as a preventative measure but that significantly dropped her BP, causing her to pass out. Florette Hurry was not covered by insurance.    Patient also complains of calf pain of the left leg. He states that she will have to undergo nerve testing to her leg as she was unable to feel 3 of her right toes.   Reports stating an Estrogen patch few weeks ago, typically replaces patch twice a week. She forgot to replace patch this morning.  She recently underwent a knee procedure and was placed on an aspirin. However, she had forgotten to taker her aspirin the week prior to her symptoms.  She also reports that her mother was recently diagnosed with leukemia   Past Medical History:  Diagnosis Date   ADD (attention deficit disorder with hyperactivity)    Allergy    Anxiety    Arthritis    Atypical chest pain 07/26/2021   Atypical chest pain     Benign intracranial hypertension 06/16/2008   Qualifier: Diagnosis of   By: Zimonjic, Milica         Choking    due to food and pills get stuck   Cholelithiasis and acute  cholecystitis without obstruction 12/02/2016   Chronic cough 12/27/2013   Depression    Dry mouth 08/12/2017   Dysmenorrhea    Family history of adverse reaction to anesthesia    pt's father has hx. of being hard to wake up post-op   Gastric ulcer 03/16/2015   GERD (gastroesophageal reflux disease)    Headache 07/09/2007   Qualifier: Diagnosis of   By: Penney Bowling MD, Bruce      Replacing diagnoses that were inactivated after the 02/16/23 regulatory import     History of gastric ulcer    History of kidney stones    Hyperlipidemia    IBS (irritable bowel syndrome)    Lower extremity edema 07/26/2021   Lower extremity edema     Migraines    neurologist-  dr c. hagen ([facility] in [place])-- treated with Emgality injection every 30 days   Mild obstructive sleep apnea    per study in epic 03-21-2017 mild osa, recommendation cpap, mouth appliance, wt loss   Nausea and vomiting 12/03/2016   OAB (overactive bladder) 08/12/2017   Osteoarthritis    right ankle   Osteochondral lesion 09/2016   right ankle   Osteomyelitis of knee region [facility])    Peripheral vascular disease (HCC)    NERVE DAMAGE RIGHT LEG AND FOOT  DUE TO INJURGY.    Plantar fasciitis of left foot    Pleural effusion 12/15/2013   PONV (postoperative nausea and vomiting)    AND HEADACHES   Pseudotumor cerebri    at back head   RUQ pain    S/P dilatation of esophageal stricture 07/2018   Sinusitis 08/12/2018   RESOLVED WITH ANTIOBIOTIC   SUI (stress urinary incontinence, female)    Wears contact lenses    Wears partial dentures    upper     Social History   Tobacco Use   Smoking status: Former    Current packs/day: 0.50    Average packs/day: 0.5 packs/day for 15.0 years (7.5 ttl pk-yrs)    Types: Cigarettes   Smokeless tobacco: Never  Vaping Use   Vaping status: Every Day   Substances: Nicotine  Substance Use Topics   Alcohol use: Yes    Comment: occasional   Drug use: No    Past Surgical  History:  Procedure Laterality Date   ANKLE ARTHROSCOPY Right 10/22/2016   Procedure: ANKLE ARTHROSCOPY;  Surgeon: Vivi Barrack, DPM;  Location: [facility];  Service: Podiatry;  Laterality: Right;  GENERAL/REG BLOCK   ANKLE ARTHROSCOPY Right 09-15-2017   @[facility]    BLADDER SURGERY  child   x 2 - as a child to stretch bladder   CESAREAN SECTION  12/24/2009   CESAREAN SECTION  11/20/2011   Procedure: CESAREAN SECTION;  Surgeon: Lenoard Aden, MD;  Location: [facility];  Service: Gynecology;  Laterality: N/A;   CHOLECYSTECTOMY N/A 12/03/2016   Procedure: LAPAROSCOPIC CHOLECYSTECTOMY WITH INTRAOPERATIVE CHOLANGIOGRAM;  Surgeon: Darnell Level, MD;  Location: [facility];  Service: General;  Laterality: N/A;   CHONDROPLASTY Right 01/12/2024   Procedure: RIGHT KNEE ARTHROSCOPY LATERAL FEMORAL CHONDROPLASTY;  Surgeon: [name], MD;  Location: [facility];  Service: Orthopedics;  Laterality: Right;   CYSTOSCOPY WITH RETROGRADE PYELOGRAM, URETEROSCOPY AND STENT PLACEMENT Left 08/10/2006   ESOPHAGOGASTRODUODENOSCOPY (EGD) WITH ESOPHAGEAL DILATION  07/2018   HARDWARE REMOVAL Right 07/01/2016   Procedure: HARDWARE REMOVAL;  Surgeon: Cammy Copa, MD;  Location: [facility];  Service: Orthopedics;  Laterality: Right;   HARVEST BONE GRAFT Right 01/12/2024   Procedure: RIGHT BONE MARROW ASPIRATE FROM ILIAC CREST DECOMPRESSION AND PLACEMENT RIGHT LATERAL FEMORAL CONDYLE;  Surgeon: [name], MD;  Location: [facility];  Service: Orthopedics;  Laterality: Right;   KNEE ARTHROSCOPY WITH LATERAL RELEASE Right 12/13/2014   Procedure: KNEE ARTHROSCOPY WITH LATERAL RELEASE;  Surgeon: Thera Flake., MD;  Location: [facility];  Service: Orthopedics;  Laterality: Right;   LAPAROSCOPIC APPENDECTOMY N/A 06/11/2014   Procedure: APPENDECTOMY LAPAROSCOPIC;  Surgeon: Velora Heckler, MD;  Location: [facility];  Service: General;  Laterality: N/A;   ORIF ANKLE FRACTURE Right  01/05/2016   Procedure: OPEN REDUCTION INTERNAL FIXATION (ORIF) ANKLE FRACTURE;  Surgeon: Cammy Copa, MD;  Location: [facility];  Service: Orthopedics;  Laterality: Right;   OVARIAN CYST SURGERY Right 03/05/2007   AND CHROMOPERTUBATION  VIA LAPAROSCOPY   ROBOTIC ASSISTED LAPAROSCOPIC HYSTERECTOMY AND SALPINGECTOMY Bilateral 08/27/2018   Procedure: XI ROBOTIC ASSISTED LAPAROSCOPIC HYSTERECTOMY AND BILATERAL SALPINGECTOMY;  Surgeon: Olivia Mackie, MD;  Location: [facility];  Service: Gynecology;  Laterality: Bilateral;  [facility] for 23hr OBS   SYNOVECTOMY Right 12/13/2014   Procedure: PLICA SYNOVECTOMY;  Surgeon: Thera Flake., MD;  Location: [facility];  Service: Orthopedics;  Laterality: Right;   TONSILLECTOMY AND ADENOIDECTOMY  child  WISDOM TOOTH EXTRACTION      Family History  Problem Relation Age of Onset   Learning disabilities Mother    Asthma Mother    Arthritis Mother    Irritable bowel syndrome Mother    Miscarriages / [place] Mother    Hypertension Father    Early death Father    Drug abuse Father    Depression Father    Cancer Father    Arthritis Father    Alcohol abuse Father    Brain cancer Father    Anesthesia problems Father        hard to wake up post-op   Lung cancer Father    Hyperlipidemia Maternal Grandmother    Hypertension Maternal Grandmother    Cancer Maternal Grandmother    Asthma Maternal Grandmother    Arthritis Maternal Grandmother    Breast cancer Maternal Grandmother    Ovarian cancer Maternal Grandmother    Diabetes Maternal Grandmother    Heart disease Maternal Grandmother    Heart attack Maternal Grandmother    Hypertension Maternal Grandfather    Hyperlipidemia Maternal Grandfather    Arthritis Maternal Grandfather    Alcohol abuse Maternal Grandfather    Cancer Maternal Grandfather    Prostate cancer Maternal Grandfather    Melanoma Maternal Grandfather    Cancer - Prostate Maternal Grandfather    Hypertension  Paternal Grandmother    COPD Paternal Grandmother    Arthritis Paternal Grandmother    Learning disabilities Daughter    Learning disabilities Son    Colon cancer Neg Hx     Allergies  Allergen Reactions   Droperidol Other (See Comments)    Jittery and restlessness   Metoclopramide Other (See Comments)    Reglan   Nickel Rash    "any jewelry that's not real"/ rash and itching    Prochlorperazine Other (See Comments)   Nsaids Other (See Comments)    DUE TO HISTORY OF GASTRIC ULCER   Tolmetin Itching and Other (See Comments)    DUE TO HISTORY OF GASTRIC ULCER  DUE TO HISTORY OF GASTRIC ULCER  DUE TO HISTORY OF GASTRIC ULCER, DUE TO HISTORY OF GASTRIC ULCER   Oxybutynin Other (See Comments)    "severe dry mouth"   Adhesive [Tape] Rash   Metoclopramide Hcl Other (See Comments)    RESTLESSNESS/JITTERY   Prochlorperazine Edisylate Other (See Comments)    RESTLESSNESS/JITTERY    Sulfa Antibiotics Rash    Current Medications:   Current Outpatient Medications:    ADDERALL XR 20 MG 24 hr capsule, Take 20 mg by mouth every morning., Disp: , Rfl: 0   albuterol (PROVENTIL HFA;VENTOLIN HFA) 108 (90 Base) MCG/ACT inhaler, Inhale 2 puffs into the lungs every 6 (six) hours as needed for wheezing or shortness of breath. , Disp: , Rfl:    ALPRAZolam (XANAX) 0.25 MG tablet, SMARTSIG:0.5 Tablet(s) By Mouth PRN, Disp: , Rfl:    aspirin EC 81 MG tablet, Take 1 tablet (81 mg total) by mouth daily. Swallow whole., Disp: 14 tablet, Rfl: 0   atorvastatin (LIPITOR) 40 MG tablet, Take 1 tablet (40 mg total) by mouth daily., Disp: 15 tablet, Rfl: 0   Bacillus Coagulans-Inulin (PROBIOTIC) 1-250 BILLION-MG CAPS, , Disp: , Rfl:    cetirizine (ZYRTEC) 10 MG tablet, Take by mouth., Disp: , Rfl:    diclofenac Sodium (VOLTAREN) 1 % GEL, APPLY TO AFFECTED AREA AS INSTRUCTED, Disp: , Rfl:    EMGALITY 120 MG/ML SOAJ, Inject 120 mg into the skin every 30 (thirty)  days., Disp: , Rfl: 4   estradiol  (VIVELLE-DOT) 0.05 MG/24HR patch, Place 1 patch (0.05 mg total) onto the skin 2 (two) times a week., Disp: 8 patch, Rfl: 12   Fezolinetant (VEOZAH) 45 MG TABS, Take 1 tablet (45 mg total) by mouth daily at 6 (six) AM., Disp: 30 tablet, Rfl: 11   fluticasone (FLONASE) 50 MCG/ACT nasal spray, Place 2 sprays into both nostrils daily., Disp: 16 g, Rfl: 6   gabapentin (NEURONTIN) 300 MG capsule, TAKE 2 CAPSULES BY MOUTH 3 TIMES DAILY (Patient taking differently: Take 600-1,200 mg by mouth See admin instructions. Take 600mg  by mouth in the morning and 1,200mg  in the evening), Disp: 180 capsule, Rfl: 4   ketoconazole (NIZORAL) 2 % cream, Apply 1 Application topically daily., Disp: 15 g, Rfl: 0   lubiprostone (AMITIZA) 24 MCG capsule, Take 24 mcg by mouth 2 (two) times daily., Disp: , Rfl:    magnesium oxide (MAG-OX) 400 MG tablet, Take by mouth., Disp: , Rfl:    MELATONIN GUMMIES PO, Take 1-2 each by mouth at bedtime., Disp: , Rfl:    mirtazapine (REMERON) 30 MG tablet, Take 30 mg by mouth daily., Disp: , Rfl:    Multiple Vitamins-Minerals (EQL CENTURY WOMENS) TABS, , Disp: , Rfl:    NUCYNTA 100 MG TABS, Take 1 tablet by mouth every 8 (eight) hours as needed., Disp: , Rfl:    omeprazole (PRILOSEC) 40 MG capsule, Take 40 mg by mouth daily., Disp: , Rfl:    ondansetron (ZOFRAN-ODT) 8 MG disintegrating tablet, Take 1 tablet by mouth every 8 (eight) hours as needed., Disp: , Rfl:    pantoprazole (PROTONIX) 20 MG tablet, Take 20 mg by mouth 2 (two) times daily., Disp: , Rfl:    promethazine (PHENERGAN) 25 MG tablet, Take 1 tablet (25 mg total) by mouth every 8 (eight) hours as needed for nausea or vomiting., Disp: 10 tablet, Rfl: 0   Rimegepant Sulfate (NURTEC) 75 MG TBDP, Nurtec ODT 75 mg disintegrating tablet  TAKE ONE TABLET BY MOUTH AS NEEDED. one PER 24 hours, Disp: , Rfl:    Saline (SIMPLY SALINE) 0.9 % AERS, Place 2 each into the nose as directed. Use nightly for sinus hygiene long-term.  Can also be used  as many times daily as desired to assist with clearing congested sinuses., Disp: 127 mL, Rfl: 11   senna-docusate (SENOKOT-S) 8.6-50 MG tablet, Take 1 tablet by mouth daily., Disp: 10 tablet, Rfl: 0   tiZANidine (ZANAFLEX) 4 MG tablet, Take 4 mg by mouth every 6 (six) hours as needed for muscle spasms. , Disp: , Rfl:    traZODone (DESYREL) 50 MG tablet, Take 1-2 tablets by mouth at bedtime as needed., Disp: , Rfl:    UBRELVY 100 MG TABS, Take by mouth as needed., Disp: , Rfl:    venlafaxine XR (EFFEXOR-XR) 150 MG 24 hr capsule, Take 2 capsules (300 mg total) by mouth daily before breakfast., Disp: 60 capsule, Rfl: 11   zonisamide (ZONEGRAN) 100 MG capsule, Take 100 mg by mouth at bedtime., Disp: , Rfl:    tirzepatide (ZEPBOUND) 2.5 MG/0.5ML Pen, Inject 2.5 mg into the skin once a week. (Patient not taking: Reported on 03/02/2024), Disp: 2 mL, Rfl: 11   Review of Systems:   Review of Systems  Respiratory:  Positive for shortness of breath.   Neurological:  Positive for dizziness and headaches.  Negative unless otherwise specified per HPI.   Vitals:   Vitals:   03/02/24 1044  BP: Marland Kitchen)  146/90  Pulse: 92  Temp: (!) 97.3 F (36.3 C)  TempSrc: Temporal  SpO2: 94%  Weight: 252 lb (114.3 kg)  Height: 5\' 7"  (1.702 m)     Body mass index is 39.47 kg/m.  Physical Exam:   Physical Exam Vitals and nursing note reviewed.  Constitutional:      General: She is not in acute distress.    Appearance: She is well-developed. She is not ill-appearing or toxic-appearing.  Cardiovascular:     Rate and Rhythm: Normal rate and regular rhythm.     Pulses: Normal pulses.     Heart sounds: Normal heart sounds, S1 normal and S2 normal.  Pulmonary:     Effort: Pulmonary effort is normal.     Breath sounds: Normal breath sounds.  Musculoskeletal:     Comments: Right leg in full hinged brace No obvious swelling in b/l legs Mild calf tenderness to palpation to left leg  Skin:    General: Skin is warm  and dry.  Neurological:     Mental Status: She is alert.     GCS: GCS eye subscore is 4. GCS verbal subscore is 5. GCS motor subscore is 6.  Psychiatric:        Speech: Speech normal.        Behavior: Behavior normal. Behavior is cooperative.     Assessment and Plan:   SOB (shortness of breath) ER visit reviewed Will order stat D-dimer for further evaluation Discussed with her that if this is elevated we will be ordering a CTA for further evaluation If any new or worsening symptoms in the meantime, reach out  Chronic migraine without aura without status migrainosus, not intractable I did provide her with a sample of Nurtec to trial for her migraine when she gets home I recommend close follow-up with PCP if headaches persist No obvious neurological deficits on my exam  Hormone replacement therapy (HRT) Due to concern for possible blood clot, I recommended that she continue to hold off on replacing her patch until we have ruled this out --she is agreeable  Elevated blood pressure reading Above goal today No evidence of end-organ damage on my exam Recommend patient monitor home blood pressure at least a few times weekly Provided her with a log to take her blood pressure daily and recommended that she follow-up with her PCP next week, she is already scheduled for this, to review these numbers If home monitoring shows consistent elevation, or any symptom(s) develop, recommend reach out to us  for further advice on next steps    Alexander Iba, PA-C  Scot Cutter M Kadhim,acting as a scribe for Alexander Iba, PA.,have documented all relevant documentation on the behalf of Alexander Iba, PA,as directed by  Alexander Iba, PA while in the presence of Alexander Iba, [place].   I, Alexander Iba, [place], have reviewed all documentation for this visit. The documentation on 03/02/24 for the exam, diagnosis, procedures, and orders are all accurate and complete.

## 2024-03-02 NOTE — Patient Instructions (Signed)
 It was great to see you!  Stat D-Dimer ordered for blood clot today  Please trial a Nurtec when you get home -- we are out of Ubrelvy at the moment!  Do not replace estrogen patch until we rule out blood clot  Your blood pressure is elevated in our office today.  I recommend that you monitor this at home.  Your goal blood pressure should be around < 130/80, unless you are over 42 years old, your goal may be closer to 140-150/90. Please note if you have been given other goals from a cardiologist or other healthcare provider, please defer to their recommendations.  When preparing to take your blood pressure: Plan ahead. Don't smoke, drink caffeine or exercise within 30 minutes before taking your blood pressure. Empty your bladder. Don't take the measurement over clothes. Remove the clothing over the arm that will be used to measure blood pressure. You can use either arm unless otherwise told by a healthcare provider. Usually there is not a big difference between readings on them. Be still. Allow at least five minutes of quiet rest before measurements. Don't talk or use the phone. Sit correctly. Sit with your back straight and supported (on a dining chair, rather than a sofa). Your feet should be flat on the floor. Do not cross your legs. Support your arm on a flat surface. The middle of the cuff should be placed on the upper arm at heart level.  Measure at the same time of the day. Take multiple readings and record the results. Each time you measure, take two readings one minute apart. Record the results and bring in to your next office visit.  In order to know how well the medication is working, I would like you to take your readings 1-2 hours after taking your blood pressure medication if possible. Take your blood pressure measurements and record 2-3 days per week.  If you get a high blood pressure reading: A single high reading is not an immediate cause for alarm. If you get a reading that  is higher than normal, take your blood pressure a second time. Write down the results of both measurements. Check with your health care professional to see if there's a health concern or whether there may be problems with your monitor. If your blood pressure readings are suddenly higher than 180/120 mm Hg, wait at least one minute and test again. If your readings are still very high, contact your health care professional immediately. You could be having a hypertensive crisis. Call 911 if your blood pressure is higher than 180/120 mm Hg and if you are having new signs or symptoms that may include: Chest pain Shortness of breath Back pain Numbness Weakness Change in vision Difficulty speaking Confusion Dizziness Vomiting       Take care,  Alexander Iba PA-C

## 2024-03-02 NOTE — Telephone Encounter (Signed)
 Pt called back concerned about having a blood clot. Told her that is why we are ordering CT STAT. Told her if you are that concerned you can go to the ED. Told her our referral coordinator is working on getting it scheduled now. IF any increase in symptoms you can go to the ED.Pt verbalized understanding.

## 2024-03-03 ENCOUNTER — Encounter (HOSPITAL_BASED_OUTPATIENT_CLINIC_OR_DEPARTMENT_OTHER): Payer: Self-pay

## 2024-03-03 ENCOUNTER — Ambulatory Visit (HOSPITAL_COMMUNITY)
Admission: RE | Admit: 2024-03-03 | Discharge: 2024-03-03 | Disposition: A | Discharge status: Home / Self Care | Source: Ambulatory Visit | Attending: Physician Assistant | Admitting: Physician Assistant

## 2024-03-03 DIAGNOSIS — R7989 Other specified abnormal findings of blood chemistry: Secondary | ICD-10-CM

## 2024-03-07 ENCOUNTER — Encounter: Payer: Self-pay | Admitting: Neurology

## 2024-03-07 ENCOUNTER — Other Ambulatory Visit: Payer: Self-pay

## 2024-03-07 DIAGNOSIS — R202 Paresthesia of skin: Secondary | ICD-10-CM

## 2024-03-08 ENCOUNTER — Ambulatory Visit (INDEPENDENT_AMBULATORY_CARE_PROVIDER_SITE_OTHER): Admitting: Internal Medicine

## 2024-03-08 ENCOUNTER — Encounter: Payer: Self-pay | Admitting: Internal Medicine

## 2024-03-08 VITALS — BP 134/80 | HR 91 | Temp 98.0°F | Ht 67.0 in | Wt 254.6 lb

## 2024-03-08 DIAGNOSIS — G4733 Obstructive sleep apnea (adult) (pediatric): Secondary | ICD-10-CM

## 2024-03-08 DIAGNOSIS — E669 Obesity, unspecified: Secondary | ICD-10-CM | POA: Diagnosis not present

## 2024-03-08 DIAGNOSIS — F322 Major depressive disorder, single episode, severe without psychotic features: Secondary | ICD-10-CM

## 2024-03-08 DIAGNOSIS — I952 Hypotension due to drugs: Secondary | ICD-10-CM

## 2024-03-08 DIAGNOSIS — M8788 Other osteonecrosis, other site: Secondary | ICD-10-CM

## 2024-03-08 DIAGNOSIS — I10 Essential (primary) hypertension: Secondary | ICD-10-CM | POA: Diagnosis not present

## 2024-03-08 DIAGNOSIS — N951 Menopausal and female climacteric states: Secondary | ICD-10-CM

## 2024-03-08 MED ORDER — TIRZEPATIDE-WEIGHT MANAGEMENT 2.5 MG/0.5ML ~~LOC~~ SOAJ: 2.5000 mg | SUBCUTANEOUS | 11 refills | Status: DC

## 2024-03-08 NOTE — Progress Notes (Signed)
 ==============================  [facility]: [id number]   -- Medical Office Visit --  Patient: Diane [name]      Age: 43 y.o.       Sex:  female  Date:   03/08/2024 Today's Healthcare Provider: Anthon Kins, MD  ==============================   Chief Complaint: follow up Menopausal Syndrome and Hypertension (Pt states has had elevated bp since her surgery 165/105 hr 105  at times.) Recovering knee surgery but has blood pressure issues, and hot flashes (which currently mild)  History of Present Illness 43 year old female with hypertension who presents with dizziness and blood pressure concerns.  She has been experiencing dizziness and coordination issues, particularly when standing up, for the past week. The dizziness is severe enough that she almost passes out and typically lasts for about a minute before resolving. These symptoms are new and have not been associated with any recent illness.  Approximately six and a half weeks ago, she underwent knee surgery involving stem cell treatment for bone necrosis and repair of cartilage damage and an ACL tear. Her recovery includes physical therapy, and she has achieved 116 degrees of motion in her knee. However, the surgery has impacted her ability to exercise and lose weight.  Her blood pressure has been labile, with home readings reaching as high as 165/105 mmHg, although recent measurements in the office have been in the 130s to 140s. She has a history of labile blood pressure for many years, with occasional high spikes. She is not currently on any medication for blood pressure.  She experiences mild hot flashes, which are currently managed with Veozah  and Estrozole. She is also on gabapentin , mirtazapine, venlafaxine , and trazodone for depression and sleep, but has not recently changed the doses of these medications.  She has a history of migraines since childhood and reports a recent ER visit  where she was treated for a migraine, although she felt it was not a migraine. She also mentions a past incident of passing out due to low blood pressure while on medication.  BP Readings from Last 30 Encounters:  03/08/24 134/80  03/02/24 (!) 146/96  02/29/24 131/67  02/08/24 118/70  02/02/24 114/73  01/25/24 (!) 138/95  01/12/24 127/78  11/21/23 139/86  07/14/23 120/75  06/20/23 114/73  06/17/23 101/64  10/16/22 125/79  09/01/22 (!) 161/96  08/08/21 103/62  07/26/21 110/80  07/20/21 104/62  07/14/20 113/67  08/10/19 121/82  04/16/19 110/66  04/06/19 130/89  04/05/19 120/84  03/18/19 123/75  12/02/18 126/79  08/28/18 123/71  08/19/18 (!) 155/95  11/26/17 134/86  11/23/17 108/72  08/14/17 122/84  08/12/17 121/86  04/21/17 120/80    Background Reviewed: Problem List: has DEPRESSION, ACUTE; Anxiety state; Opioid use disorder in remission; back pain, chronic; Knee pain, right, chronic; Ankle pain, chronic; OSA (obstructive sleep apnea); Chronic migraine without aura; Osteochondral lesion; Dysphagia; Intractable pain; Chronic constipation; Other idiopathic scoliosis, lumbar region; Obesity (BMI 30-39.9); Hyperlipidemia; Menopausal syndrome (hot flushes); Prediabetes; Weight disorder; Ankle swelling; 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 Medications:  has a current medication list which includes the following prescription(s): adderall xr, albuterol , alprazolam, aspirin  ec, atorvastatin , probiotic, cetirizine, diclofenac  sodium, emgality, estradiol , veozah , fluticasone , gabapentin , ketoconazole , magnesium  oxide, melatonin, mirtazapine, eql century womens, nucynta, omeprazole, ondansetron , pantoprazole , promethazine , nurtec, simply saline, senna-docusate, tirzepatide , tizanidine , trazodone, ubrelvy, venlafaxine  xr, zonisamide, and lubiprostone.  Allergies:  is allergic to droperidol , metoclopramide , nickel,  prochlorperazine,  nsaids, tolmetin, oxybutynin , adhesive [tape], metoclopramide  hcl, prochlorperazine edisylate, and sulfa antibiotics.  Past Medical History:  has a past medical history of ADD (attention deficit disorder with hyperactivity), Allergy , Anxiety, Arthritis, Atypical chest pain (07/26/2021), Benign intracranial hypertension (06/16/2008), Choking, Cholelithiasis and acute cholecystitis without obstruction (12/02/2016), Chronic cough (12/27/2013), Depression, Dry mouth (08/12/2017), Dysmenorrhea, Family history of adverse reaction to anesthesia, Gastric ulcer (03/16/2015), GERD (gastroesophageal reflux disease), Headache (07/09/2007), History of gastric ulcer, History of kidney stones, Hyperlipidemia, IBS (irritable bowel syndrome), Lower extremity edema (07/26/2021), Migraines, Mild obstructive sleep apnea, Nausea and vomiting (12/03/2016), OAB (overactive bladder) (08/12/2017), Osteoarthritis, Osteochondral lesion (09/2016), 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 Past Surgical History:   has a past surgical history that includes Cesarean section (12/24/2009); Cesarean section (11/20/2011); laparoscopic appendectomy (N/A, 06/11/2014); Bladder surgery (child); Wisdom tooth extraction; Ovarian cyst surgery (Right, 03/05/2007); Cystoscopy with retrograde pyelogram, ureteroscopy and stent placement (Left, 08/10/2006); Synovectomy (Right, 12/13/2014); Knee arthroscopy with lateral release (Right, 12/13/2014); ORIF ankle fracture (Right, 01/05/2016); Hardware Removal (Right, 07/01/2016); Ankle arthroscopy (Right, 10/22/2016); Cholecystectomy (N/A, 12/03/2016); Ankle arthroscopy (Right,  09-15-2017   @[facility] ); Tonsillectomy and adenoidectomy (child); Esophagogastroduodenoscopy (egd) with esophageal dilation (07/2018); Robotic assisted laparoscopic hysterectomy and salpingectomy (Bilateral, 08/27/2018); Chondroplasty (Right, 01/12/2024); and Harvest bone graft (Right, 01/12/2024). Social History:   reports that she has quit smoking. Her smoking use included cigarettes. She has a 7.5 pack-year smoking history. She has never used smokeless tobacco. She reports current alcohol use. She reports that she does not use drugs. Family History:  family history includes Alcohol abuse in her father and maternal grandfather; Anesthesia problems in her father; Arthritis in her father, maternal grandfather, maternal grandmother, mother, and paternal grandmother; Asthma in her maternal grandmother and mother; Brain cancer in her father; Breast cancer in her maternal grandmother; COPD in her paternal grandmother; Cancer in her father, maternal grandfather, and maternal grandmother; Cancer - Prostate in her maternal grandfather; Depression in her father; Diabetes in her maternal grandmother; Drug abuse in her father; Early death in her father; Heart attack in her maternal grandmother; Heart disease in her maternal grandmother; Hyperlipidemia in her maternal grandfather and maternal grandmother; Hypertension in her father, maternal grandfather, maternal grandmother, and paternal grandmother; Irritable bowel syndrome in her mother; Learning disabilities in her daughter, mother, and son; Lung cancer in her father; Melanoma in her maternal grandfather; Miscarriages / [place] in her mother; Ovarian cancer in her maternal grandmother; Prostate cancer in her maternal grandfather. Depression Screen and Health Maintenance:    03/08/2024   10:54 AM 06/17/2023   10:20 AM 08/12/2017    2:45 PM 04/06/2017   11:27 AM  PHQ 2/9 Scores  PHQ - 2 Score 0 2 0 0  PHQ- 9 Score 0 7 0     Medication Reconciliation: Current  Outpatient Medications on File Prior to Visit  Medication Sig   ADDERALL XR 20 MG 24 hr capsule Take 20 mg by mouth every morning.   albuterol  (PROVENTIL  HFA;VENTOLIN  HFA) 108 (90 Base) MCG/ACT inhaler Inhale 2 puffs into the lungs every 6 (six) hours as needed for wheezing or shortness of breath.    ALPRAZolam (XANAX) 0.25 MG tablet SMARTSIG:0.5 Tablet(s) By Mouth PRN   aspirin  EC 81 MG tablet Take 1 tablet (81 mg total) by mouth daily.  Swallow whole.   atorvastatin  (LIPITOR) 40 MG tablet Take 1 tablet (40 mg total) by mouth daily.   Bacillus Coagulans-Inulin (PROBIOTIC) 1-250 BILLION-MG CAPS    cetirizine (ZYRTEC) 10 MG tablet Take by mouth.   diclofenac  Sodium (VOLTAREN ) 1 % GEL APPLY TO AFFECTED AREA AS INSTRUCTED   EMGALITY 120 MG/ML SOAJ Inject 120 mg into the skin every 30 (thirty) days.   estradiol  (VIVELLE -DOT) 0.05 MG/24HR patch Place 1 patch (0.05 mg total) onto the skin 2 (two) times a week.   Fezolinetant  (VEOZAH ) 45 MG TABS Take 1 tablet (45 mg total) by mouth daily at 6 (six) AM.   fluticasone  (FLONASE ) 50 MCG/ACT nasal spray Place 2 sprays into both nostrils daily.   gabapentin  (NEURONTIN ) 300 MG capsule TAKE 2 CAPSULES BY MOUTH 3 TIMES DAILY (Patient taking differently: Take 600-1,200 mg by mouth See admin instructions. Take 600mg  by mouth in the morning and 1,200mg  in the evening)   ketoconazole  (NIZORAL ) 2 % cream Apply 1 Application topically daily.   magnesium  oxide (MAG-OX) 400 MG tablet Take by mouth.   MELATONIN GUMMIES PO Take 1-2 each by mouth at bedtime.   mirtazapine (REMERON) 30 MG tablet Take 30 mg by mouth daily.   Multiple Vitamins-Minerals (EQL [employer]ENTUR[employer] WOMENS) TABS    NUCYNTA 100 MG TABS Take 1 tablet by mouth every 8 (eight) hours as needed.   omeprazole (PRILOSEC) 40 MG capsule Take 40 mg by mouth daily.   ondansetron  (ZOFRAN -ODT) 8 MG disintegrating tablet Take 1 tablet by mouth every 8 (eight) hours as needed.   pantoprazole  (PROTONIX ) 20 MG tablet Take 20  mg by mouth 2 (two) times daily.   promethazine  (PHENERGAN ) 25 MG tablet Take 1 tablet (25 mg total) by mouth every 8 (eight) hours as needed for nausea or vomiting.   Rimegepant Sulfate (NURTEC) 75 MG TBDP Nurtec ODT 75 mg disintegrating tablet  TAKE ONE TABLET BY MOUTH AS NEEDED. one PER 24 hours   Saline (SIMPLY SALINE) 0.9 % AERS Place 2 each into the nose as directed. Use nightly for sinus hygiene long-term.  Can also be used as many times daily as desired to assist with clearing congested sinuses.   senna-docusate (SENOKOT-S) 8.6-50 MG tablet Take 1 tablet by mouth daily.   tiZANidine  (ZANAFLEX ) 4 MG tablet Take 4 mg by mouth every 6 (six) hours as needed for muscle spasms.    traZODone (DESYREL) 50 MG tablet Take 1-2 tablets by mouth at bedtime as needed.   UBRELVY 100 MG TABS Take by mouth as needed.   venlafaxine  XR (EFFEXOR -XR) 150 MG 24 hr capsule Take 2 capsules (300 mg total) by mouth daily before breakfast.   zonisamide (ZONEGRAN) 100 MG capsule Take 100 mg by mouth at bedtime.   lubiprostone (AMITIZA) 24 MCG capsule Take 24 mcg by mouth 2 (two) times daily.   No current facility-administered medications on file prior to visit.   Medications Discontinued During This Encounter  Medication Reason   tirzepatide  (ZEPBOUND ) 2.5 MG/0.5ML Pen Not covered by the pt's insurance        Physical Exam:    03/08/2024   11:13 AM 03/08/2024   10:45 AM 03/02/2024   11:17 AM  Vitals with BMI  Height  5\' 7"    Weight  254 lbs 10 oz   BMI  39.87   Systolic 134 138 161  Diastolic 80 90 96  Pulse  91    Wt Readings from Last 10 Encounters:  03/08/24 254 lb 9.6 oz (  115.5 kg)  03/02/24 252 lb (114.3 kg)  02/29/24 245 lb (111.1 kg)  02/02/24 252 lb (114.3 kg)  01/12/24 245 lb 9.5 oz (111.4 kg)  11/21/23 233 lb 0.4 oz (105.7 kg)  09/09/23 233 lb (105.7 kg)  07/13/23 231 lb 3.2 oz (104.9 kg)  06/20/23 224 lb 13.9 oz (102 kg)  06/17/23 224 lb 12.8 oz (102 kg)  Vital signs reviewed.   Nursing notes reviewed. Weight trend reviewed. Physical Exam  Physical Exam VITALS: P- 71, BP- 140/82 CARDIOVASCULAR: Blood pressure 140/82 mmHg supine, 134/80 mmHg sitting. Heart rate 71 bpm supine, 91 bpm sitting. Normal heart rate and rhythm, S1 and S2 normal without murmurs.  General Appearance:  No acute distress appreciable.   Well-groomed, healthy-appearing female.  Well proportioned with no abnormal fat distribution.  Good muscle tone. Pulmonary:  Normal work of breathing at rest, no respiratory distress apparent. SpO2: 98 %  Musculoskeletal: All extremities are intact.  Neurological:  Awake, alert, oriented, and engaged.  No obvious focal neurological deficits or cognitive impairments.  Sensorium seems unclouded.   Speech is clear and coherent with logical content. Psychiatric:  Appropriate mood, pleasant and cooperative demeanor, thoughtful and engaged during the exam    No results found for any visits on 03/08/24. Office Visit on 03/02/2024  Component Date Value   D-Dimer, Quant 03/02/2024 1.69 (H)   Admission on 02/29/2024, Discharged on 03/01/2024  Component Date Value   Sodium 02/29/2024 139    Potassium 02/29/2024 3.5    Chloride 02/29/2024 103    CO2 02/29/2024 26    Glucose, Bld 02/29/2024 112 (H)    BUN 02/29/2024 9    Creatinine, Ser 02/29/2024 0.71    Calcium  02/29/2024 8.8 (L)    GFR, Estimated 02/29/2024 >60    Anion gap 02/29/2024 10    WBC 02/29/2024 8.9    RBC 02/29/2024 4.48    Hemoglobin 02/29/2024 12.6    HCT 02/29/2024 38.6    MCV 02/29/2024 86.2    MCH 02/29/2024 28.1    MCHC 02/29/2024 32.6    RDW 02/29/2024 13.9    Platelets 02/29/2024 313    nRBC 02/29/2024 0.0    Troponin I (High Sensiti* 02/29/2024 3    TSH 02/29/2024 1.090   Office Visit on 02/02/2024  Component Date Value   Albumin 02/02/2024 4.1    Dehydroepiandrosterone 02/02/2024 38    LH 02/02/2024 34.9    FSH 02/02/2024 24.3    Prolactin 02/02/2024 16.9    Progesterone  02/02/2024 0.3    Estrogen 02/02/2024 276    Sex Hormone Binding 02/02/2024 31.8    Vit D, 25-Hydroxy 02/02/2024 44.8   Office Visit on 09/09/2023  Component Date Value   Rapid Strep A Screen 09/09/2023 Positive (A)    Influenza A, POC 09/09/2023 Negative    Influenza B, POC 09/09/2023 Negative    SARS Coronavirus 2 Ag 09/09/2023 Negative   Office Visit on 07/13/2023  Component Date Value   Hemoglobin A1C 07/13/2023 5.8 (A)   Admission on 06/20/2023, Discharged on 06/20/2023  Component Date Value   Lipase 06/20/2023 26    Sodium 06/20/2023 138    Potassium 06/20/2023 3.7    Chloride 06/20/2023 103    CO2 06/20/2023 27    Glucose, Bld 06/20/2023 119 (H)    BUN 06/20/2023 10    Creatinine, Ser 06/20/2023 0.75    Calcium  06/20/2023 9.4    Total Protein 06/20/2023 7.3    Albumin 06/20/2023 4.0    AST 06/20/2023  28    ALT 06/20/2023 23    Alkaline Phosphatase 06/20/2023 108    Total Bilirubin 06/20/2023 0.3    GFR, Estimated 06/20/2023 >60    Anion gap 06/20/2023 8    WBC 06/20/2023 4.8    RBC 06/20/2023 4.20    Hemoglobin 06/20/2023 11.6 (L)    HCT 06/20/2023 35.3 (L)    MCV 06/20/2023 84.0    MCH 06/20/2023 27.6    MCHC 06/20/2023 32.9    RDW 06/20/2023 13.8    Platelets 06/20/2023 263    nRBC 06/20/2023 0.0    Color, Urine 06/20/2023 YELLOW    APPearance 06/20/2023 CLEAR    Specific Gravity, Urine 06/20/2023 >1.046 (H)    pH 06/20/2023 7.5    Glucose, UA 06/20/2023 NEGATIVE    Hgb urine dipstick 06/20/2023 NEGATIVE    Bilirubin Urine 06/20/2023 NEGATIVE    Ketones, ur 06/20/2023 NEGATIVE    Protein, ur 06/20/2023 TRACE (A)    Nitrite 06/20/2023 NEGATIVE    Leukocytes,Ua 06/20/2023 NEGATIVE   Office Visit on 06/17/2023  Component Date Value   Hepatitis C Ab 06/17/2023 NON-REACTIVE    WBC 06/17/2023 6.0    RBC 06/17/2023 4.50    Platelets 06/17/2023 300.0    Hemoglobin 06/17/2023 12.3    HCT 06/17/2023 37.6    MCV 06/17/2023 83.7    MCHC 06/17/2023 32.6     RDW 06/17/2023 14.1    Sodium 06/17/2023 138    Potassium 06/17/2023 4.3    Chloride 06/17/2023 104    CO2 06/17/2023 26    Glucose, Bld 06/17/2023 89    BUN 06/17/2023 13    Creatinine, Ser 06/17/2023 0.84    Total Bilirubin 06/17/2023 0.3    Alkaline Phosphatase 06/17/2023 119 (H)    AST 06/17/2023 30    ALT 06/17/2023 27    Total Protein 06/17/2023 7.8    Albumin 06/17/2023 4.3    GFR 06/17/2023 86.68    Calcium  06/17/2023 9.4    Cholesterol 06/17/2023 157    Triglycerides 06/17/2023 184.0 (H)    HDL 06/17/2023 37.20 (L)    VLDL 06/17/2023 36.8    LDL Cholesterol 06/17/2023 83    Total CHOL/HDL Ratio 06/17/2023 4    NonHDL 06/17/2023 120.15    HIV 1&2 Ab, 4th Generati* 06/17/2023 NON-REACTIVE    TSH 06/17/2023 1.62   No image results found. VAS US  LOWER EXTREMITY VENOUS (DVT) Result Date: 03/03/2024  Lower Venous DVT Study Patient Name:  JOZEE HAMMER  Date of Exam:   03/03/2024 Medical Rec #: [id number]        Accession #:    [id number] Date of Birth: May 06, 1981        Patient Gender: F Patient Age:   14 years Exam Location:  [facility] Procedure:      VAS US  LOWER EXTREMITY VENOUS (DVT) Referring Phys: Alexander Iba --------------------------------------------------------------------------------  Indications: Swelling, Edema, and Post-op.  Comparison Study: Previous study on 12.28.2017. Performing Technologist: Ria Chad  Examination Guidelines: A complete evaluation includes B-mode imaging, spectral Doppler, color Doppler, and power Doppler as needed of all accessible portions of each vessel. Bilateral testing is considered an integral part of a complete examination. Limited examinations for reoccurring indications may be performed as noted. The reflux portion of the exam is performed with the patient in reverse Trendelenburg.  +---------+---------------+---------+-----------+----------+--------------+ RIGHT     CompressibilityPhasicitySpontaneityPropertiesThrombus Aging +---------+---------------+---------+-----------+----------+--------------+ CFV      Full           Yes  Yes                                 +---------+---------------+---------+-----------+----------+--------------+ SFJ      Full           Yes      Yes                                 +---------+---------------+---------+-----------+----------+--------------+ FV Prox  Full                                                        +---------+---------------+---------+-----------+----------+--------------+ FV Mid   Full                                                        +---------+---------------+---------+-----------+----------+--------------+ FV DistalFull                                                        +---------+---------------+---------+-----------+----------+--------------+ PFV      Full                                                        +---------+---------------+---------+-----------+----------+--------------+ POP      Full           Yes      Yes                                 +---------+---------------+---------+-----------+----------+--------------+ PTV      Full                                                        +---------+---------------+---------+-----------+----------+--------------+ PERO     Full                                                        +---------+---------------+---------+-----------+----------+--------------+ Rouleaux flow noted in bilateral lower extremities.  +---------+---------------+---------+-----------+----------+--------------+ LEFT     CompressibilityPhasicitySpontaneityPropertiesThrombus Aging +---------+---------------+---------+-----------+----------+--------------+ CFV      Full           Yes      Yes                                 +---------+---------------+---------+-----------+----------+--------------+ SFJ       Full  Yes      Yes                                 +---------+---------------+---------+-----------+----------+--------------+ FV Prox  Full                                                        +---------+---------------+---------+-----------+----------+--------------+ FV Mid   Full                                                        +---------+---------------+---------+-----------+----------+--------------+ FV DistalFull                                                        +---------+---------------+---------+-----------+----------+--------------+ PFV      Full                                                        +---------+---------------+---------+-----------+----------+--------------+ POP      Full           Yes      Yes                                 +---------+---------------+---------+-----------+----------+--------------+ PTV      Full                                                        +---------+---------------+---------+-----------+----------+--------------+ PERO     Full                                                        +---------+---------------+---------+-----------+----------+--------------+ Rouleaux flow noted in bilateral lower extremities.    Summary: BILATERAL: - No evidence of deep vein thrombosis seen in the lower extremities, bilaterally. -No evidence of popliteal cyst, bilaterally.   *See table(s) above for measurements and observations. Electronically signed by Delaney Fearing on 03/03/2024 at 5:06:43 PM.    Final    CT Angio Chest W/Cm &/Or Wo Cm Result Date: 03/02/2024 CLINICAL DATA:  Positive D-dimer and shortness of breath EXAM: CT ANGIOGRAPHY CHEST WITH CONTRAST TECHNIQUE: Multidetector CT imaging of the chest was performed using the standard protocol during bolus administration of intravenous contrast. Multiplanar CT image reconstructions and MIPs were obtained to evaluate the vascular anatomy.  RADIATION DOSE REDUCTION: This exam was performed according to the departmental dose-optimization program which includes automated exposure control, adjustment of the  mA and/or kV according to patient size and/or use of iterative reconstruction technique. CONTRAST:  OMNIPAQUE  IOHEXOL  350 MG/ML SOLN COMPARISON:  02/29/2024 FINDINGS: Cardiovascular: Heart is not significantly enlarged in size. No coronary calcifications are noted. The thoracic aorta shows no aneurysmal dilatation. The pulmonary artery shows a normal branching pattern bilaterally. No intraluminal filling defect to suggest pulmonary embolism is noted. Mediastinum/Nodes: The esophagus as visualized is within normal limits. No hilar or mediastinal adenopathy is noted. The thoracic inlet is within normal limits. Lungs/Pleura: Lungs are clear. No pleural effusion or pneumothorax. Upper Abdomen: Gallbladder has been surgically removed. No other focal abnormality is noted. Musculoskeletal: No chest wall abnormality. No acute or significant osseous findings. Review of the MIP images confirms the above findings. IMPRESSION: No evidence of pulmonary emboli. No acute abnormality noted. Electronically Signed   By: Violeta Grey M.D.   On: 03/02/2024 20:27   DG Chest 2 View Result Date: 02/29/2024 CLINICAL DATA:  Palpitations. EXAM: CHEST - 2 VIEW COMPARISON:  July 20, 2021 FINDINGS: The heart size and mediastinal contours are within normal limits. Both lungs are clear. Radiopaque surgical clips are seen within the right upper quadrant. The visualized skeletal structures are unremarkable. IMPRESSION: No active cardiopulmonary disease. Electronically Signed   By: Virgle Grime M.D.   On: 02/29/2024 21:14   XR C-ARM NO REPORT Result Date: 02/08/2024 Please see Notes tab for imaging impression.  Epidural Steroid injection Result Date: 02/08/2024 Bridget Campion, MD     02/11/2024  7:29 PM Lumbar Epidural Steroid Injection - Interlaminar Approach  with Fluoroscopic Guidance Patient: MIKENNA BUNKLEY     Date of Birth: 12-25-1980 MRN: [id number] PCP: Anthon Kins, MD     Visit Date: 02/08/2024  Universal Protocol:   Consent Given By: the patient Position: PRONE Additional Comments: Vital signs were monitored before and after the procedure. Patient was prepped and draped in the usual sterile fashion. The correct patient, procedure, and site was verified. Injection Procedure Details: Procedure diagnoses: Lumbar radiculopathy [M54.16] Meds Administered: Meds ordered this encounter Medications  methylPREDNISolone  acetate (DEPO-MEDROL ) injection 40 mg  Laterality: Left Location/Site:  L4-5 Needle: 4.5 in., 20 ga. Tuohy Needle Placement: Paramedian epidural Findings:  -Comments: Excellent flow of contrast into the epidural space. Procedure Details: Using a paramedian approach from the side mentioned above, the region overlying the inferior lamina was localized under fluoroscopic visualization and the soft tissues overlying this structure were infiltrated with 4 ml. of 1% Lidocaine  without Epinephrine . The Tuohy needle was inserted into the epidural space using a paramedian approach. The epidural space was localized using loss of resistance along with counter oblique bi-planar fluoroscopic views.  After negative aspirate for air, blood, and CSF, a 2 ml. volume of Isovue -250 was injected into the epidural space and the flow of contrast was observed. Radiographs were obtained for documentation purposes.  The injectate was administered into the level noted above. Additional Comments: The patient tolerated the procedure well Dressing: 2 x 2 sterile gauze and Band-Aid  Post-procedure details: Patient was observed during the procedure. Post-procedure instructions were reviewed. Patient left the clinic in stable condition.  DG Knee 1-2 Views Right Result Date: 01/29/2024 CLINICAL DATA:  [id number] Elective surgery [id number] EXAM: RIGHT KNEE - 1-2 VIEW COMPARISON:  None  Available. FINDINGS: Two fluoroscopic spot views of the knees submitted from the operating room. Air projects over the joint space consistent with recent surgery. Fluoroscopy time 39 seconds. Dose 2.098 mGy. IMPRESSION: Intraoperative fluoroscopy during knee surgery. Electronically Signed  By: Chadwick Colonel M.D.   On: 01/29/2024 16:22   DG C-Arm 1-60 Min-No Report Result Date: 01/12/2024 Fluoroscopy was utilized by the requesting physician.  No radiographic interpretation.   MR Lumbar Spine Wo Contrast Result Date: 01/08/2024 CLINICAL DATA:  43 year old female with low back pain radiating down the right leg. EXAM: MRI LUMBAR SPINE WITHOUT CONTRAST TECHNIQUE: Multiplanar, multisequence MR imaging of the lumbar spine was performed. No intravenous contrast was administered. COMPARISON:  Lumbar radiographs 06/17/2023. FINDINGS: Segmentation:  Normal on the comparison.  Hypoplastic ribs at T12. Alignment: Normal lumbar lordosis with subtle retrolisthesis of L3 on L4. Vertebrae: Normal vertebral body height. Normal background bone marrow signal. Intact visible sacrum and SI joints. No marrow edema or evidence of acute osseous abnormality. Conus medullaris and cauda equina: Conus extends to the T12-L1 level. No lower spinal cord or conus signal abnormality. Capacious spinal canal. Normal cauda equina nerve roots. Paraspinal and other soft tissues: Negative. Disc levels: T11-T12: Negative. T12-L1:  Negative. L1-L2:  Negative. L2-L3:  Negative. L3-L4: Disc desiccation. Subtle disc space loss and disc bulging asymmetric to the left. No stenosis. L4-L5: Disc desiccation. Subtle disc space loss and circumferential disc bulging. Mild facet and ligament flavum hypertrophy. No spinal or lateral recess stenosis. Borderline to mild bilateral L4 neural foraminal stenosis. L5-S1:  Negative. IMPRESSION: 1. Mild disc degeneration at L3-L4 and L4-L5. Borderline to mild bilateral L4 nerve level foraminal stenosis. 2. Elsewhere  normal MRI appearance of the Lumbar Spine. Electronically Signed   By: Marlise Simpers M.D.   On: 01/08/2024 11:42   MR KNEE RIGHT WO CONTRAST Result Date: 12/11/2023 CLINICAL DATA:  Right knee pain, prior surgery in 2000 EXAM: MRI OF THE RIGHT KNEE WITHOUT CONTRAST TECHNIQUE: Multiplanar, multisequence MR imaging of the knee was performed. No intravenous contrast was administered. COMPARISON:  None Available. FINDINGS: MENISCI Medial: Intact. Lateral: Intact. LIGAMENTS Cruciates: Intact ACL with mucinous degeneration. 10 mm ACL cyst along the posterior origin. Intact PCL. Collaterals: Medial collateral ligament is intact. Lateral collateral ligament complex is intact. CARTILAGE Patellofemoral: Focal shallow cartilage fissuring of the medial patellar facet. Small shallow cartilage fissure of the lateral patellar facet. Small full-thickness chondral defect involving the superior trochlear groove measuring 5 mm craniocaudal. Medial:  No chondral defect. Lateral: Full-thickness chondral defect involving the posterior weight-bearing lateral femoral condyle measuring 5 mm medial-lateral and 6 mm AP with severe subchondral marrow edema. JOINT: Trace joint effusion. Normal Hoffa's fat-pad. No plical thickening. POPLITEAL FOSSA: Popliteus tendon is intact. No Baker's cyst. EXTENSOR MECHANISM: Intact quadriceps tendon. Intact patellar tendon. Intact lateral patellar retinaculum. Intact medial patellar retinaculum. Intact MPFL. BONES: No aggressive osseous lesion. No fracture or dislocation. Small bone infarct in the posterior aspect of the lateral femoral condyle. Other: No fluid collection or hematoma. Muscles are normal. IMPRESSION: 1. No meniscal or ligamentous injury of the right knee. 2. Focal shallow cartilage fissuring of the medial patellar facet. Small shallow cartilage fissure of the lateral patellar facet. Small full-thickness chondral defect involving the superior trochlear groove measuring 5 mm craniocaudal. 3.  Full-thickness chondral defect involving the posterior weight-bearing lateral femoral condyle measuring 5 mm medial-lateral and 6 mm AP with severe subchondral marrow edema. 4. Intact ACL with mucinous degeneration. 10 mm ACL cyst along the posterior origin. Electronically Signed   By: Onnie Bilis M.D.   On: 12/11/2023 06:09      Results DIAGNOSTIC Orthostatic blood pressure: Lying: 140/82 mmHg, Pulse: 71 bpm; Sitting: 134/80 mmHg, Pulse: 91 bpm (03/08/2024)  Assessment & Plan Obesity (BMI 30-39.9)  OSA (obstructive sleep apnea)  Hypotension due to drugs She experiences dizziness upon standing, likely due to orthostatic hypotension. Orthostatic blood pressure measurements show a mild drop from 140/82 to 134/80 with an increase in pulse rate from 71 to 91, likely exacerbated by medications affecting serotonin levels. The role of baroreceptors in maintaining blood pressure upon standing and potential medication-induced instability was discussed. She should perform slow stands to prevent dizziness. Adjust medications by reducing mirtazapine from 30 mg to 15 mg, taken at night to replace trazodone. Consider salt tablets to help manage symptoms.  AVS handout given for more details She will call with request for neurology or cardiology, but defers for now. She will close follow up here instead. No syncope related to this- risks of syncope covered with precautions advised. Primary hypertension Her blood pressure has been labile, with recent readings ranging from 140s to 160s systolic. Blood pressure spikes are not considered an emergency unless consistently over 200 mmHg. Dizziness upon standing is likely due to orthostatic hypotension rather than hypertension. Blood pressure spikes are normal during certain activities and not inherently problematic unless persistent. Blood pressure under 180 mmHg is unlikely to cause coordination issues. She should monitor blood pressure at home and is educated on  the non-emergency nature of blood pressure spikes under 200 mmHg. DEPRESSION, ACUTE Currently managed with venlafaxine , mirtazapine, and trazodone. Suspected serotonin overload may contribute to orthostatic hypotension symptoms. Mirtazapine and trazodone both contribute to serotonin modulation and may need adjustment. The need to find a less serotonin-heavy solution to manage depression while minimizing side effects was discussed. Adjust mirtazapine to 15 mg at night and discontinue trazodone to reduce serotonin overload. Consider alternative depression management strategies if symptoms persist. 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 Currently undergoing physical therapy with good progress in range of motion, achieving 116 degrees of motion six weeks post-surgery, indicating a good recovery trajectory. Continue physical therapy to improve knee function. Menopausal syndrome (hot flashes) Currently mild and managed with Veozah  and Estrozole. No significant side effects reported from these medications. Veozah  is a targeted drug with minimal side effects. Continue current management with Veozah  and Estrozole.  Recording duration: 29 minutes I have personally spent 33 minutes involved in face-to-face and non-face-to-face activities for this patient on the day of the visit. Professional time spent includes the following activities: Preparing to see the patient (review of tests), Obtaining and/or reviewing separately obtained history (admission/discharge record), Performing a medically appropriate examination and/or evaluation , Ordering medications/tests/procedures, referring and communicating with other health care professionals, Documenting clinical information in the EMR, Independently interpreting results (not separately reported), Communicating results to the patient/family/caregiver,  Counseling and educating the patient/family/caregiver and Care coordination (not separately reported).         Orders Placed During this Encounter:   Meds ordered this encounter  Medications   tirzepatide  (ZEPBOUND ) 2.5 MG/0.5ML Pen    Sig: Inject 2.5 mg into the skin once a week.    Dispense:  2 mL    Refill:  11    Resubmit prior authorization.            Additional Info: This encounter employed state-of-the-art, real-time, collaborative documentation. The patient actively reviewed and assisted in updating their electronic medical record on a shared screen, ensuring transparency and facilitating joint problem-solving for the problem list, overview, and plan. This approach promotes accurate,  informed care. The treatment plan was discussed and reviewed in detail, including medication safety, potential side effects, and all patient questions. We confirmed understanding and comfort with the plan. Follow-up instructions were established, including contacting the office for any concerns, returning if symptoms worsen, persist, or new symptoms develop, and precautions for potential emergency department visits.   This document was synthesized by artificial intelligence ([employer]) using HIPAA-compliant recording of the clinical interaction;   We discussed the use of AI scribe software for clinical note transcription with the patient, who gave verbal consent to proceed.

## 2024-03-08 NOTE — Patient Instructions (Addendum)
 VISIT SUMMARY:  During your visit, we discussed your recent dizziness and blood pressure concerns, particularly when standing up. We also reviewed your ongoing recovery from knee surgery, your current management of depression, and your mild hot flashes.  YOUR PLAN:  -ORTHOSTATIC HYPOTENSION: Orthostatic hypotension is a condition where your blood pressure drops when you stand up, causing dizziness. We discussed the importance of standing up slowly to prevent dizziness. We will adjust your medications by reducing mirtazapine from 30 mg to 15 mg at night to replace trazodone. You may also consider taking salt tablets to help manage your symptoms.  -HYPERTENSION: Hypertension is high blood pressure. Your blood pressure has been fluctuating, but spikes are not considered an emergency unless consistently over 200 mmHg. We discussed that dizziness upon standing is likely due to orthostatic hypotension rather than hypertension. You should continue to monitor your blood pressure at home and remember that spikes under 200 mmHg are not an emergency.  -DEPRESSION: Depression is a mood disorder that affects your feelings and behavior. Your depression is currently managed with venlafaxine , mirtazapine, and trazodone. We suspect that too much serotonin may be contributing to your orthostatic hypotension symptoms. We will adjust your mirtazapine to 15 mg at night and discontinue trazodone to reduce serotonin overload. We may need to consider alternative strategies if symptoms persist.  -KNEE NECROSIS AND CARTILAGE DAMAGE: You are recovering from knee surgery for necrosis and cartilage damage. You are making good progress in physical therapy, achieving 116 degrees of motion. Continue with physical therapy to further improve your knee function.  -HOT FLASHES: Hot flashes are sudden feelings of warmth, often associated with menopause. Your mild hot flashes are currently managed with Veozah  and Estrozole, and you are not  experiencing significant side effects. Continue with your current management plan.  INSTRUCTIONS:  Please follow up with us  if you experience any new or worsening symptoms. Continue to monitor your blood pressure at home and keep a record of your readings. If your blood pressure consistently exceeds 200 mmHg, seek medical attention. Continue with your physical therapy for your knee and take your medications as adjusted. If you have any concerns or questions, do not hesitate to contact us .  Creating an AVS with behavioral instructions The user wants an After Visit Summary (AVS) with detailed behavioral adaptation guidance. It will include reducing the mirtazapine dosage, taking it at night, and managing the orthostatic palpitations with lifestyle changes. Behavioral suggestions will likely involve increased fluid intake, salt, compression stockings, and posture adjustments. Sleep hygiene and possibly cognitive-behavioral strategies might also be part of this. I should focus on summarizing well-established guidelines while maintaining clarity in the recommendation. I'll ensure to include diverse citations to support the advice provided. Searching for guidelines I'm planning to search for guidelines related to orthostatic hypotension and sleep hygiene, such as behavioral management, medication advice, and sleep recommendations. I'll look for trusted sources, like [facility], the American Heart Association, and NIH, and ensure I have citations for each recommendation. I'll create a summary with sections on medication changes, behavioral adaptations, and follow-up, citing at least ten sources from five different domains. Time to search for relevant info. Final AVS Draft We've got 19 sources, more than 10, and they're varied, including [address], AAFP, [address], [place], [place], and others. I feel good about the citations and content. Now, I'll compile the After Visit Summary (AVS) based on the  notes. After Visit Summary Patient: [Name] Date: 2024-03-08 Visit Purpose Assess orthostatic dizziness and adjust medication.  Medication Changes Mirtazapine: reduce from 30 mg to 15 mg at bedtime, replacing trazodone. Behavioral Adaptations Hydration: 6-8 glasses of water daily, more before standing. Salt Intake: add 1-2 g/day (unless contraindicated). Thought for 23 seconds After-Visit Summary Date: March 08, 2024 Next Steps: Behavioral changes, medication adjustment, psych consultation; hold off on cardiology/neuro referrals unless symptoms worsen.  Medication Plan Mirtazapine (Remeron): New dose: 15 mg once nightly at bedtime (reduce from 30 mg) to replace trazodone. Stop: Trazodone (Desyrel) once mirtazapine 15 mg nightly is tolerated ?cite?[email or web address]?[email or web address]?. Psychiatry Referral: Discuss this medication change and overall regimen with your psychiatrist as planned.  Behavioral Adaptations for Orthostatic Symptoms Hydration Drink 6-8 eight-ounce glasses of water daily; extra fluid in the morning helps prevent dizziness on standing ?cite?[email or web address]?. Salt Intake Add 1-2 g of table salt per day (unless contraindicated) to boost blood volume and maintain BP when upright ?cite?[email or web address]?Diane [name] Physical Counter-Maneuvers Before standing, cross and uncross legs, clench buttocks, or squeeze fists for 10-15 seconds to increase venous return ?cite?turn0search6?. Compression Garments Wear thigh-high compression stockings or an abdominal binder during daytime to reduce blood pooling in legs ?cite?turn0search1?. Gradual Position Changes Sit on the edge of the bed for 1-2 minutes before standing. Avoid prolonged standing, hot environments (e.g., hot showers), and rapid posture changes ?cite?[email or web address]?turn0search1?. Dietary & Lifestyle Modifications Eat small, frequent meals to limit post-meal BP drops. Limit alcohol, which can worsen hypotension ?cite?turn0search10?Diane [name] Exercise  Recommendations Engage in recumbent or seated exercises (e.g., stationary cycling, rowing) 20-30 minutes on most days to improve vascular tone ?cite?turn0search1?.  Behavioral Adaptations for Sleep Consistent Sleep Schedule Go to bed and wake up at the same times daily, even on weekends ?cite?turn0search7?. Bedroom Environment Keep your bedroom dark, cool, and quiet; consider eye masks or earplugs if needed ?cite?turn0search7?. Pre-Sleep Routine Spend 30 minutes before bed on relaxing activities (reading, gentle stretching, mindfulness) ?cite?turn0search2?. Limit Stimuli Avoid screens/bright lights and caffeine or heavy meals within 4 hours of bedtime ?cite?turn0search12?.  Self-Monitoring & Follow-Up Symptom & Vitals Log: Record dizziness episodes, palpitations, and orthostatic BP/heart rate (supine, seated, standing) daily. When to Contact Provider: Persistent dizziness > 5 minutes, fainting, chest pain, or palpitations deviating from your usual pattern. Signs of dehydration (e.g., dark urine, extreme thirst) or inability to tolerate these adaptations. Next Appointment: Review log and response to behavioral changes and medication adjustment in 4 weeks (or sooner if needed). Analyzing symptoms and diagnosis The patient presents with numerous complex issues. The blood pressure readings, along with orthostatic dizziness and pulse increase, might suggest orthostatic hypotension or a related condition, possibly exacerbated by medications, chronic pain, or dehydration. Given the extensive medical history, a careful review of medications and metabolic factors is warranted. The differential diagnosis could include dehydration, medication effects, cardiovascular or endocrine disorders. I think a comprehensive workup should involve serum electrolytes, orthostatic blood pressure monitoring, and possibly a cardiology referral to rule out underlying cardiovascular issues. Evaluating dizziness and  diagnosis The patient's dizziness with orthostatic changes in BP may be due to medication side effects, dehydration, or even a mild form of orthostatic hypotension. Her medication list contains potential culprits like gabapentin , alprazolam, and Nucynta. While BP drops are mild, underlying issues like autonomic dysfunction from prediabetes, peripheral neuropathy, or opioid use should be considered. Differential diagnoses include orthostatic hypotension, medication effects, anemia, arrhythmia, and vertebrobasilar insufficiency. A full workup should include repeated orthostatics, lab tests (CBC, CMP), ECG, and autonomic function tests to rule out other causes. Evaluating dizziness and orthostatic hypotension The symptoms of dizziness on standing suggest orthostatic hypotension.  A differential diagnosis could include TIA, adrenal insufficiency, or POTS. A workup might include testing for pernicious anemia, assessing cortisol levels for adrenal function, and considering carotid ultrasound. Suggested management includes increasing fluid, salt, and using compression stockings. I'll consider reliable sources like [facility], NIH, AHA, and the American Autonomic Society for guidance on orthostatic hypotension. Summarizing differential for dizziness This patient's orthostatic dizziness with fairly normal vitals suggests a few possibilities. The differential diagnoses include orthostatic hypotension, orthostatic intolerance, medication side effects, anemia, arrhythmia, autonomic dysfunction, or POTS. Chronic pain and opioids (like Nucynta, gabapentin , etc.) could be contributing. The recommended workup should include repeat orthostatics, lab tests (CBC, electrolytes, HbA1c, etc.), ECG, and possibly an echocardiogram or Holter monitor for further evaluation. Autonomic tests may be necessary if autonomic neuropathy is suspected. Thought for 28 seconds Summary The patient's reported dizziness and near-syncope on  standing, despite only modest orthostatic changes in blood pressure (140/82 supine to 132/80 standing) and a pulse rise of 20 bpm, points toward orthostatic intolerance rather than classic orthostatic hypotension (which requires a >= 20 mmHg systolic drop) ?cite?[email or web address]?[email or web address]?. Contributing factors include polypharmacy with central nervous system depressants and antihypertensive effects, potential volume depletion (e.g., from knee surgery or diuretic use), autonomic dysfunction (e.g., diabetic neuropathy), and anemia from chronic NSAID/aspirin  use ?cite?[email or web address]?turn0search14?Diane [name] A systematic workup--encompassing repeated orthostatic vitals, laboratory studies (CBC, metabolic panel, endocrine tests), cardiac evaluation (ECG, echocardiogram, Holter monitoring), autonomic testing (tilt-table, sweat testing), and a thorough medication review--is recommended to clarify the diagnosis and guide management ?cite?[email or web address]?turn0search16?Diane [name]  Differential Diagnosis 1. Orthostatic (Postural) Hypotension A sustained drop in systolic BP >= 20 mmHg or diastolic BP >= 10 mmHg upon standing defines orthostatic hypotension; however, this patient's modest BP change does not meet formal criteria ?cite?[email or web address]?turn0search11?. 2. Orthostatic Intolerance / POTS Patients may experience dizziness with lesser BP drops, often accompanied by excessive heart rate increases (> 30 bpm) within 10 minutes of standing (postural tachycardia syndrome) ?cite?[email or web address]?turn0search17?. 3. Medication-Induced Dizziness Numerous agents on her list (Nucynta, gabapentin , alprazolam, tizanidine , trazodone, mirtazapine) can cause orthostatic symptoms by central nervous system depression or vasodilation ?cite?[email or web address]?turn0search10?. 4. Volume Depletion or Blood Loss Post-operative fluid shifts after knee surgery or chronic GI bleed risk from aspirin /NSAIDs could lead to intravascular volume depletion and symptomatic  orthostasis ?cite?turn0search2?turn0search9?. 5. Autonomic Neuropathy Diabetes-related autonomic nerve damage impairs baroreceptor reflexes, causing orthostatic intolerance without marked BP drops ?cite?[email or web address]?turn0search16?. 6. Cardiac Causes Arrhythmias (e.g., bradycardia, tachyarrhythmias), structural heart disease, or valvular dysfunction may present with exertional or positional dizziness ?cite?[email or web address]?turn0search15?. 7. Anemia Reduced oxygen  delivery from anemia (e.g., chronic blood loss) can provoke lightheadedness or near-syncope on standing ?cite?turn0search2?turn0search11?. 8. Vasovagal or Situational Syncope Although less likely given upright symptomatic pattern, triggers such as pain or emotional stress could induce transient hypotension and bradycardia ?cite?turn0search8?.  Recommended Workup A. Clinical Assessment Detailed History & Review of Systems: Timing, triggers, duration of dizziness; fluid intake; bleeding signs; neurologic symptoms. ?cite?turn0search4? Comprehensive Medication Review: Identify and consider tapering or adjusting CNS depressants and vasodilators. ?cite?turn0search9? B. Vital Signs & Orthostatic Testing Repeat Orthostatic Measurements: Supine, seated, and standing (1, 3, and 5 minutes) in a quiet setting. ?cite?[email or web address]?turn0search11? Ambulatory BP Monitoring: To capture variability and post-prandial hypotension if suspected. ?cite?turn0search2? C. Laboratory Studies Complete Blood Count (CBC): Evaluate for anemia. Basic Metabolic Panel & BUN/Cr: Assess volume status and electrolyte disturbances. Thyroid  Function Tests, Cortisol, and Hemoglobin A1c: Screen for endocrine causes and glycemic control. ?cite?turn0search2? D. Cardiac Evaluation Electrocardiogram (ECG): Detect arrhythmias or conduction delays. Echocardiogram: Rule out structural or valvular heart disease. Holter/Event Monitor: Correlate  symptoms with transient arrhythmias.  ?cite?turn0search2? E. Neurologic / Autonomic Testing Tilt-Table Testing: Document heart rate and BP responses under controlled conditions for orthostatic intolerance or POTS. Autonomic Reflex Tests: Valsalva maneuver, deep breathing, sweat testing to assess autonomic nerve function. ?cite?turn0search16? F. Imaging & Specialist Referral MRI Brain/Neck or Carotid Doppler: If focal neurologic signs or vascular syncope suspected. Neurology or Autonomic Specialist Consultation: For advanced autonomic neuropathy evaluation. ?cite?turn0search3?  Management Considerations Nonpharmacologic: Increase fluid and salt intake. Compression stockings and abdominal binders. Gradual position changes. ?cite?turn0search2? Medication Adjustment: Taper or hold agents contributing to hypotension (e.g., alprazolam, tizanidine , opioids). Consider reducing or spacing full-dose nighttime sedatives. ?cite?turn0search9? Pharmacologic: If persistent orthostatic symptoms, consider fludrocortisone or midodrine under specialist guidance. ?cite?turn0search4? Address Underlying Causes: Treat anemia, optimize glycemic control, and manage autonomic neuropathy. Monitor and minimize NSAID/aspirin -related bleeding risks. By systematically evaluating these domains, you can distinguish between true orthostatic hypotension, orthostatic intolerance syndromes, and other mimics, leading to targeted therapies and improved patient safety.   It was a pleasure seeing you today! Your health and satisfaction are our top priorities.  Scherrie Curt, MD  Your Providers PCP: Anthon Kins, MD,  [phone]) Referring Provider: Anthon Kins, MD,  [phone]) Care Team Provider: Meriam Stamp, MD,  [phone]) Care Team Provider: Sydell Eva, MD,  [phone])     NEXT STEPS: [x]  Early Intervention: Schedule sooner appointment, call our on-call services, or go to emergency room if there is any  significant Increase in pain or discomfort New or worsening symptoms Sudden or severe changes in your health [x]  Flexible Follow-Up: We recommend a No follow-ups on file. for optimal routine care. This allows for progress monitoring and treatment adjustments. [x]  Preventive Care: Schedule your annual preventive care visit! It's typically covered by insurance and helps identify potential health issues early. [x]  Lab & X-ray Appointments: Incomplete tests scheduled today, or call to schedule. X-rays: [facility] (M-F, 8:30am-noon or 1pm-5pm). [x]  Medical Information Release: Sign a release form at front desk to obtain relevant medical information we don't have.  MAKING THE MOST OF OUR FOCUSED 20 MINUTE APPOINTMENTS: [x]   Clearly state your top concerns at the beginning of the visit to focus our discussion [x]   If you anticipate you will need more time, please inform the front desk during scheduling - we can book multiple appointments in the same week. [x]   If you have transportation problems- use our convenient video appointments or ask about transportation support. [x]   We can get down to business faster if you use MyChart to update information before the visit and submit non-urgent questions before your visit. Thank you for taking the time to provide details through MyChart.  Let our nurse know and she can import this information into your encounter documents.  Arrival and Wait Times: [x]   Arriving on time ensures that everyone receives prompt attention. [x]   Early morning (8a) and afternoon (1p) appointments tend to have shortest wait times. [x]   Unfortunately, we cannot delay appointments for late arrivals or hold slots during phone calls.  Getting Answers and Following Up [x]   Simple Questions & Concerns: For quick questions or basic follow-up after your visit, reach us  at [phone] or MyChart messaging. [x]   Complex Concerns: If your concern is more complex, scheduling  an appointment might be best. Discuss this with the staff to find the most suitable option. [x]   Lab & Imaging Results: We'll contact you directly if results are abnormal or you don't use MyChart. Most normal results will  be on MyChart within 2-3 business days, with a review message from Dr. Boston Byers. Haven't heard back in 2 weeks? Need results sooner? Contact us  at [phone]. [x]   Referrals: Our referral coordinator will manage specialist referrals. The specialist's office should contact you within 2 weeks to schedule an appointment. Call us  if you haven't heard from them after 2 weeks.  Staying Connected [x]   MyChart: Activate your MyChart for the fastest way to access results and message us . See the last page of this paperwork for instructions on how to activate.  Bring to Your Next Appointment [x]   Medications: Please bring all your medication bottles to your next appointment to ensure we have an accurate record of your prescriptions. [x]   Health Diaries: If you're monitoring any health conditions at home, keeping a diary of your readings can be very helpful for discussions at your next appointment.  Billing [x]   X-ray & Lab Orders: These are billed by separate companies. Contact the invoicing company directly for questions or concerns. [x]   Visit Charges: Discuss any billing inquiries with our administrative services team.  Your Satisfaction Matters [x]   Share Your Experience: We strive for your satisfaction! If you have any complaints, or preferably compliments, please let Dr. Boston Byers know directly or contact our Practice Administrators, Olinda Bertrand or [employer], by asking at the front desk.   Reviewing Your Records [x]   Review this early draft of your clinical encounter notes below and the final encounter summary tomorrow on MyChart after its been completed.  All orders placed so far are visible here: OSA (obstructive sleep apnea) -     Tirzepatide -Weight Management; Inject  2.5 mg into the skin once a week.  Dispense: 2 mL; Refill: 11  Obesity (BMI 30-39.9) -     Tirzepatide -Weight Management; Inject 2.5 mg into the skin once a week.  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

## 2024-03-08 NOTE — Assessment & Plan Note (Signed)
 Currently managed with venlafaxine , mirtazapine, and trazodone. Suspected serotonin overload may contribute to orthostatic hypotension symptoms. Mirtazapine and trazodone both contribute to serotonin modulation and may need adjustment. The need to find a less serotonin-heavy solution to manage depression while minimizing side effects was discussed. Adjust mirtazapine to 15 mg at night and discontinue trazodone to reduce serotonin overload. Consider alternative depression management strategies if symptoms persist.

## 2024-03-09 ENCOUNTER — Ambulatory Visit
Admission: RE | Admit: 2024-03-09 | Discharge: 2024-03-09 | Disposition: A | Discharge status: Home / Self Care | Source: Ambulatory Visit | Attending: Student | Admitting: Student

## 2024-03-09 DIAGNOSIS — M25562 Pain in left knee: Secondary | ICD-10-CM

## 2024-03-23 ENCOUNTER — Ambulatory Visit: Admitting: Internal Medicine

## 2024-03-24 ENCOUNTER — Other Ambulatory Visit (HOSPITAL_BASED_OUTPATIENT_CLINIC_OR_DEPARTMENT_OTHER): Payer: Self-pay | Admitting: Orthopaedic Surgery

## 2024-03-24 ENCOUNTER — Other Ambulatory Visit (HOSPITAL_BASED_OUTPATIENT_CLINIC_OR_DEPARTMENT_OTHER): Payer: Self-pay

## 2024-03-24 ENCOUNTER — Ambulatory Visit (HOSPITAL_BASED_OUTPATIENT_CLINIC_OR_DEPARTMENT_OTHER): Payer: Self-pay | Admitting: Orthopaedic Surgery

## 2024-03-24 ENCOUNTER — Ambulatory Visit (HOSPITAL_BASED_OUTPATIENT_CLINIC_OR_DEPARTMENT_OTHER): Admitting: Orthopaedic Surgery

## 2024-03-24 DIAGNOSIS — S83262A Peripheral tear of lateral meniscus, current injury, left knee, initial encounter: Secondary | ICD-10-CM

## 2024-03-24 MED ORDER — OXYCODONE HCL 5 MG PO TABS
5.0000 mg | ORAL_TABLET | ORAL | 0 refills | Status: DC | PRN
  Filled 2024-03-24: qty 10, 2d supply, fill #0

## 2024-03-24 MED ORDER — ASPIRIN 325 MG PO TBEC
325.0000 mg | DELAYED_RELEASE_TABLET | Freq: Every day | ORAL | 0 refills | Status: DC
  Filled 2024-03-24: qty 14, 14d supply, fill #0

## 2024-03-24 MED ORDER — IBUPROFEN 800 MG PO TABS
800.0000 mg | ORAL_TABLET | Freq: Three times a day (TID) | ORAL | 0 refills | Status: AC
  Filled 2024-03-24: qty 30, 10d supply, fill #0

## 2024-03-24 MED ORDER — ACETAMINOPHEN 500 MG PO TABS
500.0000 mg | ORAL_TABLET | Freq: Three times a day (TID) | ORAL | 0 refills | Status: AC
Start: 2024-03-24 — End: 2024-04-14
  Filled 2024-03-24: qty 30, 10d supply, fill #0

## 2024-03-24 NOTE — Progress Notes (Signed)
 Post Operative Evaluation    Procedure/Date of Surgery: Right knee arthroscopy with lateral femoral chondroplasty, decompression 2/25  Interval History:    Presents today 12 weeks status post the above procedure.  Overall the right knee is feeling quite good today.  She is also here for her left knee follow-up and MRI discussion.  She got several months relief from her injection although no relief from the second injection.  She is still experiencing clicking and popping laterally about the front of the knee   PMH/PSH/Family History/Social History/Meds/Allergies:    Past Medical History:  Diagnosis Date   ADD (attention deficit disorder with hyperactivity)    Allergy     Anxiety    Arthritis    Atypical chest pain 07/26/2021   Atypical chest pain     Benign intracranial hypertension 06/16/2008   Qualifier: Diagnosis of   By: Zimonjic, Milica         Choking    due to food and pills get stuck   Cholelithiasis and acute cholecystitis without obstruction 12/02/2016   Chronic cough 12/27/2013   Depression    Dry mouth 08/12/2017   Dysmenorrhea    Family history of adverse reaction to anesthesia    pt's father has hx. of being hard to wake up post-op   Gastric ulcer 03/16/2015   GERD (gastroesophageal reflux disease)    Headache 07/09/2007   Qualifier: Diagnosis of   By: Penney Bowling MD, Bruce      Replacing diagnoses that were inactivated after the 02/16/23 regulatory import     History of gastric ulcer    History of kidney stones    Hyperlipidemia    IBS (irritable bowel syndrome)    Lower extremity edema 07/26/2021   Lower extremity edema     Migraines    neurologist-  dr c. hagen ([facility] in [place])-- treated with Emgality injection every 30 days   Mild obstructive sleep apnea    per study in epic 03-21-2017 mild osa, recommendation cpap, mouth appliance, wt loss   Nausea and vomiting 12/03/2016   OAB (overactive bladder)  08/12/2017   Osteoarthritis    right ankle   Osteochondral lesion 09/2016   right ankle   Osteomyelitis of knee region [facility])    Peripheral vascular disease (HCC)    NERVE DAMAGE RIGHT LEG AND FOOT DUE TO INJURGY.    Plantar fasciitis of left foot    Pleural effusion 12/15/2013   PONV (postoperative nausea and vomiting)    AND HEADACHES   Pseudotumor cerebri    at back head   RUQ pain    S/P dilatation of esophageal stricture 07/2018   Sinusitis 08/12/2018   RESOLVED WITH ANTIOBIOTIC   SUI (stress urinary incontinence, female)    Wears contact lenses    Wears partial dentures    upper   Past Surgical History:  Procedure Laterality Date   ANKLE ARTHROSCOPY Right 10/22/2016   Procedure: ANKLE ARTHROSCOPY;  Surgeon: Charity Conch, DPM;  Location: [facility];  Service: Podiatry;  Laterality: Right;  GENERAL/REG BLOCK   ANKLE ARTHROSCOPY Right 09-15-2017   @[facility]    BLADDER SURGERY  child   x 2 - as a child to stretch bladder   CESAREAN SECTION  12/24/2009   CESAREAN SECTION  11/20/2011   Procedure: CESAREAN SECTION;  Surgeon: Camillo Celestine, MD;  Location: [facility];  Service: Gynecology;  Laterality: N/A;   CHOLECYSTECTOMY N/A 12/03/2016   Procedure: LAPAROSCOPIC CHOLECYSTECTOMY WITH INTRAOPERATIVE CHOLANGIOGRAM;  Surgeon: Oralee Billow, MD;  Location: [facility];  Service: General;  Laterality: N/A;   CHONDROPLASTY Right 01/12/2024   Procedure: RIGHT KNEE ARTHROSCOPY LATERAL FEMORAL CHONDROPLASTY;  Surgeon: Wilhelmenia Harada, MD;  Location: [facility];  Service: Orthopedics;  Laterality: Right;   CYSTOSCOPY WITH RETROGRADE PYELOGRAM, URETEROSCOPY AND STENT PLACEMENT Left 08/10/2006   ESOPHAGOGASTRODUODENOSCOPY (EGD) WITH ESOPHAGEAL DILATION  07/2018   HARDWARE REMOVAL Right 07/01/2016   Procedure: HARDWARE REMOVAL;  Surgeon: Jasmine Mesi, MD;  Location: [facility];  Service: Orthopedics;  Laterality: Right;   HARVEST BONE GRAFT Right 01/12/2024   Procedure:  RIGHT BONE MARROW ASPIRATE FROM ILIAC CREST DECOMPRESSION AND PLACEMENT RIGHT LATERAL FEMORAL CONDYLE;  Surgeon: Wilhelmenia Harada, MD;  Location: [facility];  Service: Orthopedics;  Laterality: Right;   KNEE ARTHROSCOPY WITH LATERAL RELEASE Right 12/13/2014   Procedure: KNEE ARTHROSCOPY WITH LATERAL RELEASE;  Surgeon: Forbes Ida., MD;  Location: [facility];  Service: Orthopedics;  Laterality: Right;   LAPAROSCOPIC APPENDECTOMY N/A 06/11/2014   Procedure: APPENDECTOMY LAPAROSCOPIC;  Surgeon: Keitha Pata, MD;  Location: [facility];  Service: General;  Laterality: N/A;   ORIF ANKLE FRACTURE Right 01/05/2016   Procedure: OPEN REDUCTION INTERNAL FIXATION (ORIF) ANKLE FRACTURE;  Surgeon: Jasmine Mesi, MD;  Location: [facility];  Service: Orthopedics;  Laterality: Right;   OVARIAN CYST SURGERY Right 03/05/2007   AND CHROMOPERTUBATION  VIA LAPAROSCOPY   ROBOTIC ASSISTED LAPAROSCOPIC HYSTERECTOMY AND SALPINGECTOMY Bilateral 08/27/2018   Procedure: XI ROBOTIC ASSISTED LAPAROSCOPIC HYSTERECTOMY AND BILATERAL SALPINGECTOMY;  Surgeon: Meriam Stamp, MD;  Location: [facility];  Service: Gynecology;  Laterality: Bilateral;  [facility] for 23hr OBS   SYNOVECTOMY Right 12/13/2014   Procedure: PLICA SYNOVECTOMY;  Surgeon: Forbes Ida., MD;  Location: [facility];  Service: Orthopedics;  Laterality: Right;   TONSILLECTOMY AND ADENOIDECTOMY  child   WISDOM TOOTH EXTRACTION     Social History   Socioeconomic History   Marital status: Married    Spouse name: Not on file   Number of children: 2   Years of education: Not on file   Highest education level: Not on file  Occupational History   Occupation: Investment banker, 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  Tobacco Use   Smoking status: Former    Current packs/day: 0.50    Average packs/day: 0.5 packs/day for 15.0 years (7.5 ttl pk-yrs)    Types: Cigarettes   Smokeless tobacco: Never  Vaping Use   Vaping status:  Every Day   Substances: Nicotine   Substance and Sexual Activity   Alcohol use: Yes    Comment: occasional   Drug use: No   Sexual activity: Yes    Birth control/protection: Surgical    Comment: partial hysterectomy  Other Topics Concern   Not on file  Social History Narrative   Not on file   Social Drivers of Health   Financial Resource Strain: Not on file  Food Insecurity: Not on file  Transportation Needs: Not on file  Physical Activity: Not on file  Stress: Not on file  Social Connections: Unknown (03/26/2022)   [facility]   Social Network    Social Network: Not on file   Family History  Problem Relation Age of Onset  Learning disabilities Mother    Asthma Mother    Arthritis Mother    Irritable bowel syndrome Mother    Miscarriages / [place] Mother    Hypertension Father    Early death Father    Drug abuse Father    Depression Father    Cancer Father    Arthritis Father    Alcohol abuse Father    Brain cancer Father    Anesthesia problems Father        hard to wake up post-op   Lung cancer Father    Hyperlipidemia Maternal Grandmother    Hypertension Maternal Grandmother    Cancer Maternal Grandmother    Asthma Maternal Grandmother    Arthritis Maternal Grandmother    Breast cancer Maternal Grandmother    Ovarian cancer Maternal Grandmother    Diabetes Maternal Grandmother    Heart disease Maternal Grandmother    Heart attack Maternal Grandmother    Hypertension Maternal Grandfather    Hyperlipidemia Maternal Grandfather    Arthritis Maternal Grandfather    Alcohol abuse Maternal Grandfather    Cancer Maternal Grandfather    Prostate cancer Maternal Grandfather    Melanoma Maternal Grandfather    Cancer - Prostate Maternal Grandfather    Hypertension Paternal Grandmother    COPD Paternal Grandmother    Arthritis Paternal Grandmother    Learning disabilities Daughter    Learning disabilities Son    Colon cancer  Neg Hx    Allergies  Allergen Reactions   Droperidol  Other (See Comments)    Jittery and restlessness   Metoclopramide  Other (See Comments)    Reglan    Nickel Rash    "any jewelry that's not real"/ rash and itching    Prochlorperazine Other (See Comments)   Nsaids Other (See Comments)    DUE TO HISTORY OF GASTRIC ULCER   Tolmetin Itching and Other (See Comments)    DUE TO HISTORY OF GASTRIC ULCER  DUE TO HISTORY OF GASTRIC ULCER  DUE TO HISTORY OF GASTRIC ULCER, DUE TO HISTORY OF GASTRIC ULCER   Oxybutynin  Other (See Comments)    "severe dry mouth"   Adhesive [Tape] Rash   Metoclopramide  Hcl Other (See Comments)    RESTLESSNESS/JITTERY   Prochlorperazine Edisylate Other (See Comments)    RESTLESSNESS/JITTERY    Sulfa Antibiotics Rash   Current Outpatient Medications  Medication Sig Dispense Refill   acetaminophen  (TYLENOL ) 500 MG tablet Take 1 tablet (500 mg total) by mouth every 8 (eight) hours for 10 days. 30 tablet 0   aspirin  EC 325 MG tablet Take 1 tablet (325 mg total) by mouth daily. 14 tablet 0   ibuprofen  (ADVIL ) 800 MG tablet Take 1 tablet (800 mg total) by mouth every 8 (eight) hours for 10 days. Please take with food, please alternate with acetaminophen  30 tablet 0   oxyCODONE  (ROXICODONE ) 5 MG immediate release tablet Take 1 tablet (5 mg total) by mouth every 4 (four) hours as needed for severe pain (pain score 7-10) or breakthrough pain. 10 tablet 0   ADDERALL XR 20 MG 24 hr capsule Take 20 mg by mouth every morning.  0   albuterol  (PROVENTIL  HFA;VENTOLIN  HFA) 108 (90 Base) MCG/ACT inhaler Inhale 2 puffs into the lungs every 6 (six) hours as needed for wheezing or shortness of breath.      ALPRAZolam (XANAX) 0.25 MG tablet SMARTSIG:0.5 Tablet(s) By Mouth PRN     aspirin  EC 81 MG tablet Take 1 tablet (81 mg total) by mouth daily. Swallow whole.  14 tablet 0   atorvastatin  (LIPITOR) 40 MG tablet Take 1 tablet (40 mg total) by mouth daily. 15 tablet 0   Bacillus  Coagulans-Inulin (PROBIOTIC) 1-250 BILLION-MG CAPS      cetirizine (ZYRTEC) 10 MG tablet Take by mouth.     diclofenac  Sodium (VOLTAREN ) 1 % GEL APPLY TO AFFECTED AREA AS INSTRUCTED     EMGALITY 120 MG/ML SOAJ Inject 120 mg into the skin every 30 (thirty) days.  4   estradiol  (VIVELLE -DOT) 0.05 MG/24HR patch Place 1 patch (0.05 mg total) onto the skin 2 (two) times a week. 8 patch 12   Fezolinetant  (VEOZAH ) 45 MG TABS Take 1 tablet (45 mg total) by mouth daily at 6 (six) AM. 30 tablet 11   fluticasone  (FLONASE ) 50 MCG/ACT nasal spray Place 2 sprays into both nostrils daily. 16 g 6   gabapentin  (NEURONTIN ) 300 MG capsule TAKE 2 CAPSULES BY MOUTH 3 TIMES DAILY (Patient taking differently: Take 600-1,200 mg by mouth See admin instructions. Take 600mg  by mouth in the morning and 1,200mg  in the evening) 180 capsule 4   ketoconazole  (NIZORAL ) 2 % cream Apply 1 Application topically daily. 15 g 0   lubiprostone (AMITIZA) 24 MCG capsule Take 24 mcg by mouth 2 (two) times daily.     magnesium  oxide (MAG-OX) 400 MG tablet Take by mouth.     MELATONIN GUMMIES PO Take 1-2 each by mouth at bedtime.     mirtazapine (REMERON) 30 MG tablet Take 30 mg by mouth daily.     Multiple Vitamins-Minerals (EQL CENTURY WOMENS) TABS      NUCYNTA 100 MG TABS Take 1 tablet by mouth every 8 (eight) hours as needed.     omeprazole (PRILOSEC) 40 MG capsule Take 40 mg by mouth daily.     ondansetron  (ZOFRAN -ODT) 8 MG disintegrating tablet Take 1 tablet by mouth every 8 (eight) hours as needed.     pantoprazole  (PROTONIX ) 20 MG tablet Take 20 mg by mouth 2 (two) times daily.     promethazine  (PHENERGAN ) 25 MG tablet Take 1 tablet (25 mg total) by mouth every 8 (eight) hours as needed for nausea or vomiting. 10 tablet 0   Rimegepant Sulfate (NURTEC) 75 MG TBDP Nurtec ODT 75 mg disintegrating tablet  TAKE ONE TABLET BY MOUTH AS NEEDED. one PER 24 hours     Saline (SIMPLY SALINE) 0.9 % AERS Place 2 each into the nose as directed.  Use nightly for sinus hygiene long-term.  Can also be used as many times daily as desired to assist with clearing congested sinuses. 127 mL 11   senna-docusate (SENOKOT-S) 8.6-50 MG tablet Take 1 tablet by mouth daily. 10 tablet 0   tirzepatide  (ZEPBOUND ) 2.5 MG/0.5ML Pen Inject 2.5 mg into the skin once a week. 2 mL 11   tiZANidine  (ZANAFLEX ) 4 MG tablet Take 4 mg by mouth every 6 (six) hours as needed for muscle spasms.      traZODone (DESYREL) 50 MG tablet Take 1-2 tablets by mouth at bedtime as needed.     UBRELVY 100 MG TABS Take by mouth as needed.     venlafaxine  XR (EFFEXOR -XR) 150 MG 24 hr capsule Take 2 capsules (300 mg total) by mouth daily before breakfast. 60 capsule 11   zonisamide (ZONEGRAN) 100 MG capsule Take 100 mg by mouth at bedtime.     No current facility-administered medications for this visit.   No results found.  Review of Systems:   A ROS was performed including pertinent  positives and negatives as documented in the HPI.   Musculoskeletal Exam:    Last menstrual period 08/26/2018.  Right knee incisions are well-appearing without erythema or drainage.  Trace effusion.  Range of motion is from 0 to 120 degrees.  No joint line tenderness.  Left knee with crepitus about the lateral joint line with positive McMurray laterally,range of motion is from 0-130, negative McMurray, negative Lachman  Imaging:    MRI left knee: There is a tear of the anterior horn of the lateral meniscus with some extrusion  I personally reviewed and interpreted the radiographs.   Assessment:   12 weeks status post right knee arthroscopy with lateral femoral condyle abrasion arthroplasty and core decompression overall doing well.  At this time she is still persistent in the left knee despite 2 injections.  I did discuss that her lateral meniscus is torn with some extrusion.  Overall I do believe she would benefit from an anterior horn repair with possible centralization in order to  optimize her outcome and improve her mobility.  I did discuss the risks and limitations.  I discussed the associated recovery timeframe.  It is discussed that would allow her for immediate weightbearing.  After discussion she would like to proceed with this  Plan :    - Plan for left knee lateral meniscal repair   After a lengthy discussion of treatment options, including risks, benefits, alternatives, complications of surgical and nonsurgical conservative options, the patient elected surgical repair.   The patient  is aware of the material risks  and complications including, but not limited to injury to adjacent structures, neurovascular injury, infection, numbness, bleeding, implant failure, thermal burns, stiffness, persistent pain, failure to heal, disease transmission from allograft, need for further surgery, dislocation, anesthetic risks, blood clots, risks of death,and others. The probabilities of surgical success and failure discussed with patient given their particular co-morbidities.The time and nature of expected rehabilitation and recovery was discussed.The patient's questions were all answered preoperatively.  No barriers to understanding were noted. I explained the natural history of the disease process and Rx rationale.  I explained to the patient what I considered to be reasonable expectations given their personal situation.  The final treatment plan was arrived at through a shared patient decision making process model.      I personally saw and evaluated the patient, and participated in the management and treatment plan.  Wilhelmenia Harada, MD Attending Physician, Orthopedic Surgery  This document was dictated using Dragon voice recognition software. A reasonable attempt at proof reading has been made to minimize errors.

## 2024-04-04 ENCOUNTER — Other Ambulatory Visit (HOSPITAL_BASED_OUTPATIENT_CLINIC_OR_DEPARTMENT_OTHER): Payer: Self-pay

## 2024-04-05 ENCOUNTER — Other Ambulatory Visit: Payer: Self-pay | Admitting: Orthopaedic Surgery

## 2024-04-05 ENCOUNTER — Encounter: Payer: Self-pay | Admitting: Orthopaedic Surgery

## 2024-04-05 DIAGNOSIS — S83262A Peripheral tear of lateral meniscus, current injury, left knee, initial encounter: Secondary | ICD-10-CM | POA: Diagnosis not present

## 2024-04-05 NOTE — Progress Notes (Signed)
 Date of Surgery: 04/05/2024  INDICATIONS: Diane [name] is a 43 y.o.-year-old female with left knee lateral meniscal tear.  The risk and benefits of the procedure were discussed in detail and documented in the pre-operative evaluation.   PREOPERATIVE DIAGNOSIS: 1. leftlateral meniscus tear  POSTOPERATIVE DIAGNOSIS: Same with trochlear lesion 3 x 2 cm, patella lesion 2 x 2 centimeters  PROCEDURE: 1. Left knee lateral meniscal repair with cartilage biopsy  SURGEON: Carmina Chris MD  ASSISTANT: Deon Flatter, ATC  ANESTHESIA:  general  IV FLUIDS AND URINE: See anesthesia record.  ANTIBIOTICS: Ancef   ESTIMATED BLOOD LOSS: 5 mL.  IMPLANTS:  * No surgical log found *  DRAINS: None  CULTURES: None  COMPLICATIONS: none  DESCRIPTION OF PROCEDURE:  Examination under anesthesia: A careful examination under anesthesia was performed.  Knee ROM motion was: -3-135 Lachman: Normal Pivot Shift: Normal Posterior drawer: normal.   Varus stability in full extension: normal.   Varus stability in 30 degrees of flexion: normal.  Valgus stability in full extension: normal.   Valgus stability in 30 degrees of flexion: normal.  Posterolateral drawer: normal   Intra-operative findings: A thorough arthroscopic examination of the knee was performed.  The findings are: 1. Suprapatellar pouch: Normal 2. Undersurface of median ridge: Normal 3. Medial patellar facet: Normal 4. Lateral patellar facet: 2 x 2 centimeter cartilage lesion full-thickness 5. Trochlea: 3 x 2 cm cartilage lesionfull-thickness 6. Lateral gutter/popliteus tendon: Normal 7. Hoffa's fat pad: Normal 8. Medial gutter/plica: Normal 9. ACL: Normal 10. PCL: Normal 11. Medial meniscus: Normal 12. Medial compartment cartilage: Normal 13. Lateral meniscus: horizontal cleavage component tear involving the anterior horn and body with lateral to 14. Lateral compartment cartilage: Normal  I identified the patient in the  pre-operative holding area.  I marked the operative knee with my initials. I reviewed the risks and benefits of the proposed surgical intervention and the patient wished to proceed.  Anesthesia performed a peripheral nerve block.  Patient was subsequently taken back to the operating room.  The patient was transferred to the operative suite and placed in the supine position with all bony prominences padded.     SCDs were placed on the non-operative lower extremity. Appropriate antibiotics was administered within 1 hour before incision. The operative lower extremity was then prepped and draped in standard fashion. A time out was performed confirming the correct extremity, correct patient and correct procedure.    A standard anterolateral portal was made with an 11 blade.  The ideal position for the anteromedial portal was established using a spinal needle.  This AM portal was then created under direct visualization with an 11 blade.  A full diagnostic arthroscopy was then performed, as described above, including probing of the chondral and meniscal surfaces.     At this time I proceeded with the lateral meniscal repair.  Given the fact this was both horizontal and with an extrusion component the decision was made to perform socialization compression.  A also suture anchors were introduced via the lateral border of the proximal incision.  A uterus lasso was then placed on the meniscocapsular junction this was used to pass the suture.  This was due to the knotless mechanism and tension.  This had excellent restoration of good compression of the horizontal cleavage component  At this time the curet was introduced and 3  pieces of cartilage were removed from the medial femoral condyle nonweightbearing portion and taken for cartilage biopsy  That concluded the case.  Skin  was closed with 2-0 Vicryl and 3-0 nylon. Xeroform gauze, gauze, Tegaderm, Iceman and brace were applied.  Instrument, sponge, and needle  counts were correct prior to wound closure and at the conclusion of the case.  The patient was taken to the PACU without complication     POSTOPERATIVE PLAN: she'll be activity and weightbearing as tolerated on the left leg.  I'll see her back to assess her progress.  We may ultimately need to plan to intervene on her chondral loss over the trochlea as well as patella.  She was placed on aspirin  for 2 weeks for blood clot prevention  Carmina Chris, MD 5:01 PM

## 2024-04-07 ENCOUNTER — Other Ambulatory Visit (HOSPITAL_BASED_OUTPATIENT_CLINIC_OR_DEPARTMENT_OTHER): Payer: Self-pay | Admitting: Orthopaedic Surgery

## 2024-04-07 ENCOUNTER — Other Ambulatory Visit (HOSPITAL_BASED_OUTPATIENT_CLINIC_OR_DEPARTMENT_OTHER): Payer: Self-pay

## 2024-04-07 ENCOUNTER — Telehealth: Payer: Self-pay | Admitting: Orthopaedic Surgery

## 2024-04-07 MED ORDER — OXYCODONE HCL 5 MG PO TABS: 5.0000 mg | ORAL_TABLET | ORAL | 0 refills | Status: DC | PRN

## 2024-04-07 MED ORDER — MELOXICAM 15 MG PO TABS: 15.0000 mg | ORAL_TABLET | Freq: Every day | ORAL | 0 refills | Status: DC

## 2024-04-07 NOTE — Telephone Encounter (Signed)
 Patient called. Says she is in a lot of pain. Would like [facility] to call her. [phone]

## 2024-04-08 ENCOUNTER — Encounter: Admitting: Neurology

## 2024-04-12 ENCOUNTER — Ambulatory Visit (HOSPITAL_BASED_OUTPATIENT_CLINIC_OR_DEPARTMENT_OTHER): Attending: Orthopaedic Surgery | Admitting: Physical Therapy

## 2024-04-13 ENCOUNTER — Other Ambulatory Visit: Payer: Self-pay

## 2024-04-13 ENCOUNTER — Encounter (HOSPITAL_BASED_OUTPATIENT_CLINIC_OR_DEPARTMENT_OTHER): Payer: Self-pay | Admitting: Physical Therapy

## 2024-04-13 ENCOUNTER — Ambulatory Visit (HOSPITAL_BASED_OUTPATIENT_CLINIC_OR_DEPARTMENT_OTHER): Attending: Orthopaedic Surgery | Admitting: Physical Therapy

## 2024-04-13 DIAGNOSIS — M25562 Pain in left knee: Secondary | ICD-10-CM | POA: Diagnosis present

## 2024-04-13 DIAGNOSIS — S83262A Peripheral tear of lateral meniscus, current injury, left knee, initial encounter: Secondary | ICD-10-CM | POA: Diagnosis present

## 2024-04-13 DIAGNOSIS — R600 Localized edema: Secondary | ICD-10-CM | POA: Diagnosis present

## 2024-04-13 DIAGNOSIS — R262 Difficulty in walking, not elsewhere classified: Secondary | ICD-10-CM | POA: Diagnosis present

## 2024-04-13 DIAGNOSIS — M6281 Muscle weakness (generalized): Secondary | ICD-10-CM | POA: Diagnosis present

## 2024-04-13 NOTE — Therapy (Signed)
 OUTPATIENT PHYSICAL THERAPY LOWER EXTREMITY EVALUATION   Patient Name: Diane [name] MRN: [id number] DOB:1981/08/23, 43 y.o., female Today's Date: 04/13/2024  END OF SESSION:  PT End of Session - 04/13/24 1631     Visit Number 1    Number of Visits 21    Date for PT Re-Evaluation 07/12/24    Authorization Type [employer]BS 20 VL    PT Start Time 1620    PT Stop Time 1700    PT Time Calculation (min) 40 min    Activity Tolerance Patient tolerated treatment well    Behavior During Therapy WFL for tasks assessed/performed             Past Medical History:  Diagnosis Date   ADD (attention deficit disorder with hyperactivity)    Allergy     Anxiety    Arthritis    Atypical chest pain 07/26/2021   Atypical chest pain     Benign intracranial hypertension 06/16/2008   Qualifier: Diagnosis of   By: Isobel Marie         Choking    due to food and pills get stuck   Cholelithiasis and acute cholecystitis without obstruction 12/02/2016   Chronic cough 12/27/2013   Depression    Dry mouth 08/12/2017   Dysmenorrhea    Family history of adverse reaction to anesthesia    pt's father has hx. of being hard to wake up post-op   Gastric ulcer 03/16/2015   GERD (gastroesophageal reflux disease)    Headache 07/09/2007   Qualifier: Diagnosis of   By: Penney Bowling MD, Bruce      Replacing diagnoses that were inactivated after the 02/16/23 regulatory import     History of gastric ulcer    History of kidney stones    Hyperlipidemia    IBS (irritable bowel syndrome)    Lower extremity edema 07/26/2021   Lower extremity edema     Migraines    neurologist-  dr c. hagen ([facility] in [place])-- treated with Emgality injection every 30 days   Mild obstructive sleep apnea    per study in epic 03-21-2017 mild osa, recommendation cpap, mouth appliance, wt loss   Nausea and vomiting 12/03/2016   OAB (overactive bladder) 08/12/2017   Osteoarthritis    right ankle   Osteochondral  lesion 09/2016   right ankle   Osteomyelitis of knee region [facility])    Peripheral vascular disease (HCC)    NERVE DAMAGE RIGHT LEG AND FOOT DUE TO INJURGY.    Plantar fasciitis of left foot    Pleural effusion 12/15/2013   PONV (postoperative nausea and vomiting)    AND HEADACHES   Pseudotumor cerebri    at back head   RUQ pain    S/P dilatation of esophageal stricture 07/2018   Sinusitis 08/12/2018   RESOLVED WITH ANTIOBIOTIC   SUI (stress urinary incontinence, female)    Wears contact lenses    Wears partial dentures    upper   Past Surgical History:  Procedure Laterality Date   ANKLE ARTHROSCOPY Right 10/22/2016   Procedure: ANKLE ARTHROSCOPY;  Surgeon: Charity Conch, DPM;  Location: [facility];  Service: Podiatry;  Laterality: Right;  GENERAL/REG BLOCK   ANKLE ARTHROSCOPY Right 09-15-2017   @[facility]    BLADDER SURGERY  child   x 2 - as a child to stretch bladder   CESAREAN SECTION  12/24/2009   CESAREAN SECTION  11/20/2011   Procedure: CESAREAN SECTION;  Surgeon: Camillo Celestine, MD;  Location: [facility];  Service: Gynecology;  Laterality: N/A;   CHOLECYSTECTOMY N/A 12/03/2016   Procedure: LAPAROSCOPIC CHOLECYSTECTOMY WITH INTRAOPERATIVE CHOLANGIOGRAM;  Surgeon: Oralee Billow, MD;  Location: [facility];  Service: General;  Laterality: N/A;   CHONDROPLASTY Right 01/12/2024   Procedure: RIGHT KNEE ARTHROSCOPY LATERAL FEMORAL CHONDROPLASTY;  Surgeon: Wilhelmenia Harada, MD;  Location: [facility];  Service: Orthopedics;  Laterality: Right;   CYSTOSCOPY WITH RETROGRADE PYELOGRAM, URETEROSCOPY AND STENT PLACEMENT Left 08/10/2006   ESOPHAGOGASTRODUODENOSCOPY (EGD) WITH ESOPHAGEAL DILATION  07/2018   HARDWARE REMOVAL Right 07/01/2016   Procedure: HARDWARE REMOVAL;  Surgeon: Jasmine Mesi, MD;  Location: [facility] OR;  Service: Orthopedics;  Laterality: Right;   HARVEST BONE GRAFT Right 01/12/2024   Procedure: RIGHT BONE MARROW ASPIRATE FROM ILIAC CREST DECOMPRESSION AND  PLACEMENT RIGHT LATERAL FEMORAL CONDYLE;  Surgeon: Wilhelmenia Harada, MD;  Location: [facility];  Service: Orthopedics;  Laterality: Right;   KNEE ARTHROSCOPY WITH LATERAL RELEASE Right 12/13/2014   Procedure: KNEE ARTHROSCOPY WITH LATERAL RELEASE;  Surgeon: Forbes Ida., MD;  Location: [facility];  Service: Orthopedics;  Laterality: Right;   LAPAROSCOPIC APPENDECTOMY N/A 06/11/2014   Procedure: APPENDECTOMY LAPAROSCOPIC;  Surgeon: Keitha Pata, MD;  Location: [facility];  Service: General;  Laterality: N/A;   ORIF ANKLE FRACTURE Right 01/05/2016   Procedure: OPEN REDUCTION INTERNAL FIXATION (ORIF) ANKLE FRACTURE;  Surgeon: Jasmine Mesi, MD;  Location: [facility];  Service: Orthopedics;  Laterality: Right;   OVARIAN CYST SURGERY Right 03/05/2007   AND CHROMOPERTUBATION  VIA LAPAROSCOPY   ROBOTIC ASSISTED LAPAROSCOPIC HYSTERECTOMY AND SALPINGECTOMY Bilateral 08/27/2018   Procedure: XI ROBOTIC ASSISTED LAPAROSCOPIC HYSTERECTOMY AND BILATERAL SALPINGECTOMY;  Surgeon: Meriam Stamp, MD;  Location: [facility];  Service: Gynecology;  Laterality: Bilateral;  [facility] for 23hr OBS   SYNOVECTOMY Right 12/13/2014   Procedure: PLICA SYNOVECTOMY;  Surgeon: Forbes Ida., MD;  Location: [facility];  Service: Orthopedics;  Laterality: Right;   TONSILLECTOMY AND ADENOIDECTOMY  child   WISDOM TOOTH EXTRACTION     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 01/12/2024   AVN (avascular necrosis of bone) (HCC) 01/12/2024   Prediabetes 07/14/2023   Weight disorder 07/14/2023   Ankle swelling 07/14/2023   Menopausal syndrome (hot flushes) 07/13/2023   Hyperlipidemia 06/19/2023   Intractable pain 06/17/2023   Chronic constipation 06/17/2023   Other idiopathic scoliosis, lumbar region 06/17/2023   Dysphagia 08/12/2017   Osteochondral lesion 06/03/2017   OSA (obstructive sleep apnea) 03/24/2017   Ankle pain, chronic  01/05/2016   Knee pain, right, chronic 04/12/2015   Chronic migraine without aura 04/01/2013   DEPRESSION, ACUTE 10/02/2008   Opioid use disorder in remission 10/02/2008   back pain, chronic 03/28/2008   Obesity (BMI 30-39.9) 01/25/2008   Anxiety state 11/09/2007    PCP: Anthon Kins, MD   REFERRING PROVIDER: Wilhelmenia Harada, MD   REFERRING DIAG:  [phone] (ICD-10-CM) - Peripheral tear of lateral meniscus of left knee as current injury, initial encounter      THERAPY DIAG:  Left knee pain, unspecified chronicity  Localized edema  Difficulty in walking, not elsewhere classified  Muscle weakness (generalized)  Rationale for Evaluation and Treatment: Rehabilitation  ONSET DATE: 04/05/2024 DOS  PROCEDURE: 1. Left knee lateral meniscal repair with cartilage biopsy   SUBJECTIVE:   PROCEDURE: 1.  Left knee lateral meniscal repair   SUBJECTIVE:    SUBJECTIVE STATEMENT: Pt states pain  was very bad post op but is better controlled now. Meloxicam gave her bloody stools. Pt does note calf pain starting 2 days ago. Feels tender to touch and does hurt with ankle movements. Denies SOB, dizziness, or red/swollen calf.  Has not changed surgical bandanges yet. Pt denies s/s of infection. Pt is icing daily and elevating. Pt has kept brace on and locked. Using both crutches.    PERTINENT HISTORY: Anxiety, arthritis, depression, PVD, LBP PAIN:  Are you having pain? Yes: NPRS scale: 7/10 Pain location: anterior knee surgical site Pain description: aching Aggravating factors: movement; transitions Relieving factors: rest   PRECAUTIONS: Knee   RED FLAGS: None      WEIGHT BEARING RESTRICTIONS: WBAT   FALLS:  Has patient fallen in last 6 months? No   LIVING ENVIRONMENT:  Lives with family- 2 children Staying with mother on couch for now  2 story home Crutches and walker    OCCUPATION:    PLOF: Independent   PATIENT GOALS: return to walking, return to exercise to  help with her weight loss   OBJECTIVE:  Note: Objective measures were completed at Evaluation unless otherwise noted.   DIAGNOSTIC FINDINGS:  IMPRESSION: Mild chondromalacia of the lateral facet of the patella. No full-thickness cartilage defect is identified. There is a moderate-sized reactive joint effusion.   Abnormal signal in the anterior horn and anterior body of the lateral meniscus suggesting either an intrameniscal tear or dissecting intrameniscal cyst. Correlation for anterior lateral joint line pain. No displaced meniscal tear is present.   Electronically signed by: Adrien Alberta MD 03/10/2024 05:30 PM EDT RP Workstation: [id number]   PATIENT SURVEYS:  LEFS 12/80   COGNITION: Overall cognitive status: Within functional limits for tasks assessed                         SENSATION: WFL   EDEMA:  Mild into lateral and medial patellar pouches    POSTURE: No Significant postural limitations   PALPATION: No TTP noted around incision sites, clean and dry   LOWER EXTREMITY ROM:   Active ROM Right eval Left eval  Hip flexion      Hip extension      Hip abduction      Hip adduction      Hip internal rotation      Hip external rotation      Knee flexion  WNL 75  Knee extension  WNL 0  Ankle dorsiflexion      Ankle plantarflexion      Ankle inversion      Ankle eversion       (Blank rows = not tested) *= pain/symptoms   LOWER EXTREMITY MMT:    deferred due to surgical precautions.  Able to perform single SLR without extensor lag     GAIT: Distance walked: 4ft feet  Assistive device utilized: bilat axillary crutches Level of assistance: Modified independence Comments:  step through   TODAY'S TREATMENT:  DATE:    Bandage change- no s/s of infection (no Tegaderm used-pt is highly allergic);  gauze replaced and new compression  bandage applied; pt able to use paper tape   Heel toe gait mechanics to reduce gastroc/soleus overuse Exercises - Long Sitting Quad Set with Towel Roll Under Heel  - 2-3 x daily - 7 x weekly - 2 sets - 10 reps - 2 hold - Active Straight Leg Raise with Quad Set  - 2-3 x daily - 7 x weekly - 2 sets - 10 reps - Seated Knee Flexion AAROM  - 2-3 x daily - 7 x weekly - 2 sets - 10 reps - 2-3 hold - Ankle Pumps in Elevation  - 8 x daily - 7 x weekly - 1 sets - 20 reps    PATIENT EDUCATION:  Education details: protocol/precautions, diagnosis, prognosis, anatomy, exercise progression, DOMS expectations, muscle firing,  HEP, POC   Person educated: Patient Education method: Explanation, Demonstration, and Handouts Education comprehension: verbalized understanding, returned demonstration, verbal cues required, and tactile cues required   HOME EXERCISE PROGRAM:  Access Code: [email or web address] URL: [email or web address] Date: 04/13/2024 Prepared by: Silver Dross   ASSESSMENT:   CLINICAL IMPRESSION: Patient is a 43 y.o. female who was seen today for physical therapy evaluation and treatment for s/p L meniscus repair. Pt with expected gait, ROM, and strength deviations at this time. Pt with very good quad set and is to do SLR at this time. Pt motion is very good for first PT visit. Pt without signs of infection and understands current precautions. Pt does have L calf pain that is concerning for potential DVT but improves with walking mechanics edu. No inflammation or swelling noted, calf supple to touch, but positive Homan's. Pt was advised on worsening symptoms and emergent nature of ED visit in the case of DVT. Pt gave verbal understanding. Plan to continue with meniscus repair protocol at future sessions. Pt would benefit from continued skilled therapy in order to reach goals and maximize functional L LE strength and ROM for full return to PLOF.    OBJECTIVE IMPAIRMENTS: Abnormal gait, decreased  activity tolerance, decreased balance, decreased endurance, decreased mobility, difficulty walking, decreased ROM, decreased strength, increased edema, increased muscle spasms, impaired flexibility, improper body mechanics, and pain.    ACTIVITY LIMITATIONS: carrying, lifting, bending, standing, squatting, stairs, transfers, hygiene/grooming, locomotion level, and caring for others   PARTICIPATION LIMITATIONS: meal prep, cleaning, laundry, shopping, community activity, and exercise   PERSONAL FACTORS: Fitness, Time since onset of injury/illness/exacerbation, past experiences, and 2+ comorbidites are also affecting patient's functional outcome.    REHAB POTENTIAL: Good   CLINICAL DECISION MAKING: Stable/uncomplicated   EVALUATION COMPLEXITY: Low     GOALS:     SHORT TERM GOALS: Target date: 05/25/2024     Pt will become independent with HEP in order to demonstrate synthesis of PT education.  Baseline: Goal status: INITIAL   2.  Pt will have an at least 18  pt improvement in LEFS measure in order to demonstrate MCID improvement in daily function.   Baseline: Goal status: INITIAL   3.  Patient will demonstrate full L knee ROM for transfers, stairs, ambulation. Baseline:  Goal status: INITIAL       LONG TERM GOALS: Target date: 07/06/2024        Pt  will become independent with final HEP in order to demonstrate synthesis of PT education. Baseline:  Goal status: INITIAL   2.  Pt will have an at least  27 pt improvement in LEFS measure in order to demonstrate MCID improvement in daily function.   Baseline:  Goal status: INITIAL   3.  Pt will be able to lift/squat/hold >25 lbs in order to demonstrate functional improvement in lumbopelvic strength for return to light and normal ADL.  Baseline:  Goal status: INITIAL   4.  Pt will be able to demonstrate full reciprocal stair management without UE in order to demonstrate functional improvement in LE function for self-care and  daily mobility.    Baseline:  Goal status: INITIAL   5.  Pt will be able to demonstrate at least 80% Quad Index in order to demonstrate functional improvement of surgical knee quad strength for advancing to next phase of protocol.    Baseline:  Goal status: INITIAL      PLAN:   PT FREQUENCY: 1-2x/week   PT DURATION: 12 weeks   PLANNED INTERVENTIONS: Therapeutic exercises, Therapeutic activity, Neuromuscular re-education, Balance training, Gait training, Patient/Family education, Joint manipulation, Joint mobilization, Stair training, Orthotic/Fit training, DME instructions, Aquatic Therapy, Dry Needling, Electrical stimulation, Spinal manipulation, Spinal mobilization, Cryotherapy, Moist heat, Compression bandaging, scar mobilization, Splintting, Taping, Traction, Ultrasound, Ionotophoresis 4mg /ml Dexamethasone , and Manual therapy   PLAN FOR NEXT SESSION: f/u with HEP, progress per meniscus repair protocol     Silver Dross, PT 04/13/2024, 5:13 PM

## 2024-04-15 ENCOUNTER — Encounter (HOSPITAL_BASED_OUTPATIENT_CLINIC_OR_DEPARTMENT_OTHER): Admitting: Orthopaedic Surgery

## 2024-04-18 ENCOUNTER — Ambulatory Visit: Payer: Self-pay | Admitting: Orthopaedic Surgery

## 2024-04-18 ENCOUNTER — Ambulatory Visit (INDEPENDENT_AMBULATORY_CARE_PROVIDER_SITE_OTHER): Admitting: Orthopaedic Surgery

## 2024-04-18 ENCOUNTER — Encounter (HOSPITAL_BASED_OUTPATIENT_CLINIC_OR_DEPARTMENT_OTHER): Payer: Self-pay | Admitting: Orthopaedic Surgery

## 2024-04-18 DIAGNOSIS — M949 Disorder of cartilage, unspecified: Secondary | ICD-10-CM

## 2024-04-18 NOTE — Progress Notes (Signed)
 Post Operative Evaluation    Procedure/Date of Surgery: Status post left knee medial meniscal repair arthroscopic debridement 2 weeks prior  Interval History:    Presents 6 weeks status post the above procedure.  Overall she is getting some relief although she is still having persistent patellofemoral type symptoms and grinding.  This has been quite disabling and now the right knee is starting to hurt as well.   PMH/PSH/Family History/Social History/Meds/Allergies:    Past Medical History:  Diagnosis Date   ADD (attention deficit disorder with hyperactivity)    Allergy     Anxiety    Arthritis    Atypical chest pain 07/26/2021   Atypical chest pain     Benign intracranial hypertension 06/16/2008   Qualifier: Diagnosis of   By: Zimonjic, Milica         Choking    due to food and pills get stuck   Cholelithiasis and acute cholecystitis without obstruction 12/02/2016   Chronic cough 12/27/2013   Depression    Dry mouth 08/12/2017   Dysmenorrhea    Family history of adverse reaction to anesthesia    pt's father has hx. of being hard to wake up post-op   Gastric ulcer 03/16/2015   GERD (gastroesophageal reflux disease)    Headache 07/09/2007   Qualifier: Diagnosis of   By: Penney Bowling MD, Bruce      Replacing diagnoses that were inactivated after the 02/16/23 regulatory import     History of gastric ulcer    History of kidney stones    Hyperlipidemia    IBS (irritable bowel syndrome)    Lower extremity edema 07/26/2021   Lower extremity edema     Migraines    neurologist-  dr c. hagen ([facility] in [place])-- treated with Emgality injection every 30 days   Mild obstructive sleep apnea    per study in epic 03-21-2017 mild osa, recommendation cpap, mouth appliance, wt loss   Nausea and vomiting 12/03/2016   OAB (overactive bladder) 08/12/2017   Osteoarthritis    right ankle   Osteochondral lesion 09/2016   right ankle    Osteomyelitis of knee region [facility])    Peripheral vascular disease (HCC)    NERVE DAMAGE RIGHT LEG AND FOOT DUE TO INJURGY.    Plantar fasciitis of left foot    Pleural effusion 12/15/2013   PONV (postoperative nausea and vomiting)    AND HEADACHES   Pseudotumor cerebri    at back head   RUQ pain    S/P dilatation of esophageal stricture 07/2018   Sinusitis 08/12/2018   RESOLVED WITH ANTIOBIOTIC   SUI (stress urinary incontinence, female)    Wears contact lenses    Wears partial dentures    upper   Past Surgical History:  Procedure Laterality Date   ANKLE ARTHROSCOPY Right 10/22/2016   Procedure: ANKLE ARTHROSCOPY;  Surgeon: Charity Conch, DPM;  Location: [facility];  Service: Podiatry;  Laterality: Right;  GENERAL/REG BLOCK   ANKLE ARTHROSCOPY Right 09-15-2017   @[facility]    BLADDER SURGERY  child   x 2 - as a child to stretch bladder   CESAREAN SECTION  12/24/2009   CESAREAN SECTION  11/20/2011   Procedure: CESAREAN SECTION;  Surgeon: Camillo Celestine, MD;  Location: [facility];  Service: Gynecology;  Laterality: N/A;  CHOLECYSTECTOMY N/A 12/03/2016   Procedure: LAPAROSCOPIC CHOLECYSTECTOMY WITH INTRAOPERATIVE CHOLANGIOGRAM;  Surgeon: Oralee Billow, MD;  Location: [facility];  Service: General;  Laterality: N/A;   CHONDROPLASTY Right 01/12/2024   Procedure: RIGHT KNEE ARTHROSCOPY LATERAL FEMORAL CHONDROPLASTY;  Surgeon: Wilhelmenia Harada, MD;  Location: [facility];  Service: Orthopedics;  Laterality: Right;   CYSTOSCOPY WITH RETROGRADE PYELOGRAM, URETEROSCOPY AND STENT PLACEMENT Left 08/10/2006   ESOPHAGOGASTRODUODENOSCOPY (EGD) WITH ESOPHAGEAL DILATION  07/2018   HARDWARE REMOVAL Right 07/01/2016   Procedure: HARDWARE REMOVAL;  Surgeon: Jasmine Mesi, MD;  Location: [facility];  Service: Orthopedics;  Laterality: Right;   HARVEST BONE GRAFT Right 01/12/2024   Procedure: RIGHT BONE MARROW ASPIRATE FROM ILIAC CREST DECOMPRESSION AND PLACEMENT RIGHT LATERAL FEMORAL  CONDYLE;  Surgeon: Wilhelmenia Harada, MD;  Location: [facility];  Service: Orthopedics;  Laterality: Right;   KNEE ARTHROSCOPY WITH LATERAL RELEASE Right 12/13/2014   Procedure: KNEE ARTHROSCOPY WITH LATERAL RELEASE;  Surgeon: Forbes Ida., MD;  Location: [facility];  Service: Orthopedics;  Laterality: Right;   LAPAROSCOPIC APPENDECTOMY N/A 06/11/2014   Procedure: APPENDECTOMY LAPAROSCOPIC;  Surgeon: Keitha Pata, MD;  Location: [facility];  Service: General;  Laterality: N/A;   ORIF ANKLE FRACTURE Right 01/05/2016   Procedure: OPEN REDUCTION INTERNAL FIXATION (ORIF) ANKLE FRACTURE;  Surgeon: Jasmine Mesi, MD;  Location: [facility];  Service: Orthopedics;  Laterality: Right;   OVARIAN CYST SURGERY Right 03/05/2007   AND CHROMOPERTUBATION  VIA LAPAROSCOPY   ROBOTIC ASSISTED LAPAROSCOPIC HYSTERECTOMY AND SALPINGECTOMY Bilateral 08/27/2018   Procedure: XI ROBOTIC ASSISTED LAPAROSCOPIC HYSTERECTOMY AND BILATERAL SALPINGECTOMY;  Surgeon: Meriam Stamp, MD;  Location: [facility];  Service: Gynecology;  Laterality: Bilateral;  [facility] for 23hr OBS   SYNOVECTOMY Right 12/13/2014   Procedure: PLICA SYNOVECTOMY;  Surgeon: Forbes Ida., MD;  Location:  SURGERY CENTER;  Service: Orthopedics;  Laterality: Right;   TONSILLECTOMY AND ADENOIDECTOMY  child   WISDOM TOOTH EXTRACTION     Social History   Socioeconomic History   Marital status: Married    Spouse name: Not on file   Number of children: 2   Years of education: Not on file   Highest education level: Not on file  Occupational History   Occupation: Investment banker, 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  Tobacco Use   Smoking status: Former    Current packs/day: 0.50    Average packs/day: 0.5 packs/day for 15.0 years (7.5 ttl pk-yrs)    Types: Cigarettes   Smokeless tobacco: Never  Vaping Use   Vaping status: Every Day   Substances: Nicotine   Substance and Sexual Activity   Alcohol use: Yes     Comment: occasional   Drug use: No   Sexual activity: Yes    Birth control/protection: Surgical    Comment: partial hysterectomy  Other Topics Concern   Not on file  Social History Narrative   Not on file   Social Drivers of Health   Financial Resource Strain: Not on file  Food Insecurity: Not on file  Transportation Needs: Not on file  Physical Activity: Not on file  Stress: Not on file  Social Connections: Unknown (03/26/2022)   [facility]   Social Network    Social Network: Not on file   Family History  Problem Relation Age of Onset   Learning disabilities Mother    Asthma Mother    Arthritis Mother    Irritable  bowel syndrome Mother    Miscarriages / [place] Mother    Hypertension Father    Early death Father    Drug abuse Father    Depression Father    Cancer Father    Arthritis Father    Alcohol abuse Father    Brain cancer Father    Anesthesia problems Father        hard to wake up post-op   Lung cancer Father    Hyperlipidemia Maternal Grandmother    Hypertension Maternal Grandmother    Cancer Maternal Grandmother    Asthma Maternal Grandmother    Arthritis Maternal Grandmother    Breast cancer Maternal Grandmother    Ovarian cancer Maternal Grandmother    Diabetes Maternal Grandmother    Heart disease Maternal Grandmother    Heart attack Maternal Grandmother    Hypertension Maternal Grandfather    Hyperlipidemia Maternal Grandfather    Arthritis Maternal Grandfather    Alcohol abuse Maternal Grandfather    Cancer Maternal Grandfather    Prostate cancer Maternal Grandfather    Melanoma Maternal Grandfather    Cancer - Prostate Maternal Grandfather    Hypertension Paternal Grandmother    COPD Paternal Grandmother    Arthritis Paternal Grandmother    Learning disabilities Daughter    Learning disabilities Son    Colon cancer Neg Hx    Allergies  Allergen Reactions   Droperidol  Other (See Comments)    Jittery  and restlessness   Metoclopramide  Other (See Comments)    Reglan    Nickel Rash    "any jewelry that's not real"/ rash and itching    Prochlorperazine Other (See Comments)   Nsaids Other (See Comments)    DUE TO HISTORY OF GASTRIC ULCER   Tolmetin Itching and Other (See Comments)    DUE TO HISTORY OF GASTRIC ULCER  DUE TO HISTORY OF GASTRIC ULCER  DUE TO HISTORY OF GASTRIC ULCER, DUE TO HISTORY OF GASTRIC ULCER   Oxybutynin  Other (See Comments)    "severe dry mouth"   Adhesive [Tape] Rash   Metoclopramide  Hcl Other (See Comments)    RESTLESSNESS/JITTERY   Prochlorperazine Edisylate Other (See Comments)    RESTLESSNESS/JITTERY    Sulfa Antibiotics Rash   Current Outpatient Medications  Medication Sig Dispense Refill   meloxicam  (MOBIC ) 15 MG tablet Take 1 tablet (15 mg total) by mouth daily. 14 tablet 0   oxyCODONE  (ROXICODONE ) 5 MG immediate release tablet Take 1 tablet (5 mg total) by mouth every 4 (four) hours as needed. 15 tablet 0   ADDERALL XR 20 MG 24 hr capsule Take 20 mg by mouth every morning.  0   albuterol  (PROVENTIL  HFA;VENTOLIN  HFA) 108 (90 Base) MCG/ACT inhaler Inhale 2 puffs into the lungs every 6 (six) hours as needed for wheezing or shortness of breath.      ALPRAZolam (XANAX) 0.25 MG tablet SMARTSIG:0.5 Tablet(s) By Mouth PRN     aspirin  EC 325 MG tablet Take 1 tablet (325 mg total) by mouth daily. 14 tablet 0   aspirin  EC 81 MG tablet Take 1 tablet (81 mg total) by mouth daily. Swallow whole. 14 tablet 0   atorvastatin  (LIPITOR) 40 MG tablet Take 1 tablet (40 mg total) by mouth daily. 15 tablet 0   Bacillus Coagulans-Inulin (PROBIOTIC) 1-250 BILLION-MG CAPS      cetirizine (ZYRTEC) 10 MG tablet Take by mouth.     diclofenac  Sodium (VOLTAREN ) 1 % GEL APPLY TO AFFECTED AREA AS INSTRUCTED     EMGALITY 120  MG/ML SOAJ Inject 120 mg into the skin every 30 (thirty) days.  4   estradiol  (VIVELLE -DOT) 0.05 MG/24HR patch Place 1 patch (0.05 mg total) onto the skin 2  (two) times a week. 8 patch 12   Fezolinetant  (VEOZAH ) 45 MG TABS Take 1 tablet (45 mg total) by mouth daily at 6 (six) AM. 30 tablet 11   fluticasone  (FLONASE ) 50 MCG/ACT nasal spray Place 2 sprays into both nostrils daily. 16 g 6   gabapentin  (NEURONTIN ) 300 MG capsule TAKE 2 CAPSULES BY MOUTH 3 TIMES DAILY (Patient taking differently: Take 600-1,200 mg by mouth See admin instructions. Take 600mg  by mouth in the morning and 1,200mg  in the evening) 180 capsule 4   ketoconazole  (NIZORAL ) 2 % cream Apply 1 Application topically daily. 15 g 0   lubiprostone (AMITIZA) 24 MCG capsule Take 24 mcg by mouth 2 (two) times daily.     magnesium  oxide (MAG-OX) 400 MG tablet Take by mouth.     MELATONIN GUMMIES PO Take 1-2 each by mouth at bedtime.     mirtazapine (REMERON) 30 MG tablet Take 30 mg by mouth daily.     Multiple Vitamins-Minerals (EQL CENTURY WOMENS) TABS      NUCYNTA 100 MG TABS Take 1 tablet by mouth every 8 (eight) hours as needed.     omeprazole (PRILOSEC) 40 MG capsule Take 40 mg by mouth daily.     ondansetron  (ZOFRAN -ODT) 8 MG disintegrating tablet Take 1 tablet by mouth every 8 (eight) hours as needed.     pantoprazole  (PROTONIX ) 20 MG tablet Take 20 mg by mouth 2 (two) times daily.     promethazine  (PHENERGAN ) 25 MG tablet Take 1 tablet (25 mg total) by mouth every 8 (eight) hours as needed for nausea or vomiting. 10 tablet 0   Rimegepant Sulfate (NURTEC) 75 MG TBDP Nurtec ODT 75 mg disintegrating tablet  TAKE ONE TABLET BY MOUTH AS NEEDED. one PER 24 hours     Saline (SIMPLY SALINE) 0.9 % AERS Place 2 each into the nose as directed. Use nightly for sinus hygiene long-term.  Can also be used as many times daily as desired to assist with clearing congested sinuses. 127 mL 11   senna-docusate (SENOKOT-S) 8.6-50 MG tablet Take 1 tablet by mouth daily. 10 tablet 0   tirzepatide  (ZEPBOUND ) 2.5 MG/0.5ML Pen Inject 2.5 mg into the skin once a week. 2 mL 11   tiZANidine  (ZANAFLEX ) 4 MG tablet  Take 4 mg by mouth every 6 (six) hours as needed for muscle spasms.      traZODone (DESYREL) 50 MG tablet Take 1-2 tablets by mouth at bedtime as needed.     UBRELVY 100 MG TABS Take by mouth as needed.     venlafaxine  XR (EFFEXOR -XR) 150 MG 24 hr capsule Take 2 capsules (300 mg total) by mouth daily before breakfast. 60 capsule 11   zonisamide (ZONEGRAN) 100 MG capsule Take 100 mg by mouth at bedtime.     No current facility-administered medications for this visit.   No results found.  Review of Systems:   A ROS was performed including pertinent positives and negatives as documented in the HPI.   Musculoskeletal Exam:     Left knee incisions are well-appearing without erythema or drainage.  Mild effusion.  Positive patellar grind.  Negative patellar laxity with the knee in full extension.  Range of motion is from 0 to 100 degrees.  Imaging:      I personally reviewed and interpreted the  radiographs.   Assessment:   2 weeks status post left knee arthroscopy with chondral biopsy and meniscal repair.  Today's visit I did describe that I would like her to progress out of her brace as she gets full extension and good quad strength to lift the leg 5 times.  We did also discuss about her chondral defect under the patella as well as the trochlea.  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  Unfortunately given her young age I do believe she is at risk for progression of this.  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  I did discuss the associated risks associated with this per surgery.  I did discuss the possibility and associated recovery timeframe.  After discussion she will plan to proceed with this  Plan :    - Plan for left knee arthrotomy with patella and trochlear matrix associated chondrocyte implantation    After a  lengthy discussion of treatment options, including risks, benefits, alternatives, complications of surgical and nonsurgical conservative options, the patient elected surgical repair.   The patient  is aware of the material risks  and complications including, but not limited to injury to adjacent structures, neurovascular injury, infection, numbness, bleeding, implant failure, thermal burns, stiffness, persistent pain, failure to heal, disease transmission from allograft, need for further surgery, dislocation, anesthetic risks, blood clots, risks of death,and others. The probabilities of surgical success and failure discussed with patient given their particular co-morbidities.The time and nature of expected rehabilitation and recovery was discussed.The patient's questions were all answered preoperatively.  No barriers to understanding were noted. I explained the natural history of the disease process and Rx rationale.  I explained to the patient what I considered to be reasonable expectations given their personal situation.  The final treatment plan was arrived at through a shared patient decision making process model.       I personally saw and evaluated the patient, and participated in the management and treatment plan.  Wilhelmenia Harada, MD Attending Physician, Orthopedic Surgery  This document was dictated using Dragon voice recognition software. A reasonable attempt at proof reading has been made to minimize errors.

## 2024-04-20 ENCOUNTER — Ambulatory Visit (HOSPITAL_BASED_OUTPATIENT_CLINIC_OR_DEPARTMENT_OTHER): Attending: Orthopaedic Surgery | Admitting: Physical Therapy

## 2024-04-25 ENCOUNTER — Encounter (HOSPITAL_BASED_OUTPATIENT_CLINIC_OR_DEPARTMENT_OTHER): Payer: Self-pay | Admitting: Orthopaedic Surgery

## 2024-04-25 ENCOUNTER — Other Ambulatory Visit: Payer: Self-pay | Admitting: Internal Medicine

## 2024-04-26 ENCOUNTER — Other Ambulatory Visit: Payer: Self-pay | Admitting: Orthopaedic Surgery

## 2024-04-26 MED ORDER — METHYLPREDNISOLONE 4 MG PO TBPK: ORAL_TABLET | ORAL | 0 refills | Status: DC

## 2024-04-27 ENCOUNTER — Ambulatory Visit (HOSPITAL_BASED_OUTPATIENT_CLINIC_OR_DEPARTMENT_OTHER): Attending: Orthopaedic Surgery | Admitting: Physical Therapy

## 2024-04-29 ENCOUNTER — Encounter: Admitting: Neurology

## 2024-04-29 NOTE — Telephone Encounter (Signed)
 04/28/24 I spoke with the patient and scheduled her from surgery on 05/24/24.

## 2024-05-04 ENCOUNTER — Encounter (HOSPITAL_BASED_OUTPATIENT_CLINIC_OR_DEPARTMENT_OTHER): Payer: Self-pay | Admitting: Orthopaedic Surgery

## 2024-05-04 ENCOUNTER — Ambulatory Visit (HOSPITAL_BASED_OUTPATIENT_CLINIC_OR_DEPARTMENT_OTHER): Admitting: Physical Therapy

## 2024-05-04 ENCOUNTER — Other Ambulatory Visit (HOSPITAL_BASED_OUTPATIENT_CLINIC_OR_DEPARTMENT_OTHER): Payer: Self-pay | Admitting: Orthopaedic Surgery

## 2024-05-04 ENCOUNTER — Encounter (HOSPITAL_BASED_OUTPATIENT_CLINIC_OR_DEPARTMENT_OTHER): Payer: Self-pay | Admitting: Physical Therapy

## 2024-05-04 ENCOUNTER — Encounter (HOSPITAL_BASED_OUTPATIENT_CLINIC_OR_DEPARTMENT_OTHER): Admitting: Orthopaedic Surgery

## 2024-05-06 ENCOUNTER — Encounter (HOSPITAL_BASED_OUTPATIENT_CLINIC_OR_DEPARTMENT_OTHER): Payer: Self-pay | Admitting: Orthopaedic Surgery

## 2024-05-06 ENCOUNTER — Ambulatory Visit (HOSPITAL_BASED_OUTPATIENT_CLINIC_OR_DEPARTMENT_OTHER): Admitting: Orthopaedic Surgery

## 2024-05-06 ENCOUNTER — Other Ambulatory Visit (HOSPITAL_BASED_OUTPATIENT_CLINIC_OR_DEPARTMENT_OTHER): Payer: Self-pay

## 2024-05-06 DIAGNOSIS — S83262A Peripheral tear of lateral meniscus, current injury, left knee, initial encounter: Secondary | ICD-10-CM

## 2024-05-06 MED ORDER — ACETAMINOPHEN 500 MG PO TABS: 500.0000 mg | ORAL_TABLET | Freq: Three times a day (TID) | ORAL | 0 refills | Status: AC

## 2024-05-06 MED ORDER — OXYCODONE HCL 5 MG PO TABS: 5.0000 mg | ORAL_TABLET | ORAL | 0 refills | Status: DC | PRN

## 2024-05-06 MED ORDER — ASPIRIN 325 MG PO TBEC: 325.0000 mg | DELAYED_RELEASE_TABLET | Freq: Every day | ORAL | 0 refills | Status: DC

## 2024-05-06 MED ORDER — IBUPROFEN 800 MG PO TABS
800.0000 mg | ORAL_TABLET | Freq: Three times a day (TID) | ORAL | 0 refills | Status: AC
Start: 2024-05-06 — End: 2024-05-16

## 2024-05-06 NOTE — Addendum Note (Signed)
 Addended by: Benno Brensinger L on: 05/06/2024 03:33 PM   Modules accepted: Orders

## 2024-05-06 NOTE — Progress Notes (Signed)
 Post Operative Evaluation    Procedure/Date of Surgery: Status post left knee medial meniscal repair arthroscopic debridement 2 weeks prior  Interval History:    Presents status post the above procedure.  Overall she is getting some relief although she is still having persistent patellofemoral type symptoms and grinding.  This has been quite disabling and now the right knee is starting to hurt as well.   PMH/PSH/Family History/Social History/Meds/Allergies:    Past Medical History:  Diagnosis Date   ADD (attention deficit disorder with hyperactivity)    Allergy     Anxiety    Arthritis    Atypical chest pain 07/26/2021   Atypical chest pain     Benign intracranial hypertension 06/16/2008   Qualifier: Diagnosis of   By: Zimonjic, Milica         Choking    due to food and pills get stuck   Cholelithiasis and acute cholecystitis without obstruction 12/02/2016   Chronic cough 12/27/2013   Depression    Dry mouth 08/12/2017   Dysmenorrhea    Family history of adverse reaction to anesthesia    pt's father has hx. of being hard to wake up post-op   Gastric ulcer 03/16/2015   GERD (gastroesophageal reflux disease)    Headache 07/09/2007   Qualifier: Diagnosis of   By: Penney Bowling MD, Bruce      Replacing diagnoses that were inactivated after the 02/16/23 regulatory import     History of gastric ulcer    History of kidney stones    Hyperlipidemia    IBS (irritable bowel syndrome)    Lower extremity edema 07/26/2021   Lower extremity edema     Migraines    neurologist-  dr c. hagen ([facility] in [place])-- treated with Emgality injection every 30 days   Mild obstructive sleep apnea    per study in epic 03-21-2017 mild osa, recommendation cpap, mouth appliance, wt loss   Nausea and vomiting 12/03/2016   OAB (overactive bladder) 08/12/2017   Osteoarthritis    right ankle   Osteochondral lesion 09/2016   right ankle   Osteomyelitis  of knee region [facility])    Peripheral vascular disease (HCC)    NERVE DAMAGE RIGHT LEG AND FOOT DUE TO INJURGY.    Plantar fasciitis of left foot    Pleural effusion 12/15/2013   PONV (postoperative nausea and vomiting)    AND HEADACHES   Pseudotumor cerebri    at back head   RUQ pain    S/P dilatation of esophageal stricture 07/2018   Sinusitis 08/12/2018   RESOLVED WITH ANTIOBIOTIC   SUI (stress urinary incontinence, female)    Wears contact lenses    Wears partial dentures    upper   Past Surgical History:  Procedure Laterality Date   ANKLE ARTHROSCOPY Right 10/22/2016   Procedure: ANKLE ARTHROSCOPY;  Surgeon: Charity Conch, DPM;  Location: [facility];  Service: Podiatry;  Laterality: Right;  GENERAL/REG BLOCK   ANKLE ARTHROSCOPY Right 09-15-2017   @[facility]    BLADDER SURGERY  child   x 2 - as a child to stretch bladder   CESAREAN SECTION  12/24/2009   CESAREAN SECTION  11/20/2011   Procedure: CESAREAN SECTION;  Surgeon: Camillo Celestine, MD;  Location: [facility];  Service: Gynecology;  Laterality: N/A;   CHOLECYSTECTOMY N/A  12/03/2016   Procedure: LAPAROSCOPIC CHOLECYSTECTOMY WITH INTRAOPERATIVE CHOLANGIOGRAM;  Surgeon: Oralee Billow, MD;  Location: [facility];  Service: General;  Laterality: N/A;   CHONDROPLASTY Right 01/12/2024   Procedure: RIGHT KNEE ARTHROSCOPY LATERAL FEMORAL CHONDROPLASTY;  Surgeon: Wilhelmenia Harada, MD;  Location: [facility];  Service: Orthopedics;  Laterality: Right;   CYSTOSCOPY WITH RETROGRADE PYELOGRAM, URETEROSCOPY AND STENT PLACEMENT Left 08/10/2006   ESOPHAGOGASTRODUODENOSCOPY (EGD) WITH ESOPHAGEAL DILATION  07/2018   HARDWARE REMOVAL Right 07/01/2016   Procedure: HARDWARE REMOVAL;  Surgeon: Jasmine Mesi, MD;  Location: [facility];  Service: Orthopedics;  Laterality: Right;   HARVEST BONE GRAFT Right 01/12/2024   Procedure: RIGHT BONE MARROW ASPIRATE FROM ILIAC CREST DECOMPRESSION AND PLACEMENT RIGHT LATERAL FEMORAL CONDYLE;  Surgeon:  Wilhelmenia Harada, MD;  Location: [facility];  Service: Orthopedics;  Laterality: Right;   KNEE ARTHROSCOPY WITH LATERAL RELEASE Right 12/13/2014   Procedure: KNEE ARTHROSCOPY WITH LATERAL RELEASE;  Surgeon: Forbes Ida., MD;  Location: [facility];  Service: Orthopedics;  Laterality: Right;   LAPAROSCOPIC APPENDECTOMY N/A 06/11/2014   Procedure: APPENDECTOMY LAPAROSCOPIC;  Surgeon: Keitha Pata, MD;  Location: [facility];  Service: General;  Laterality: N/A;   ORIF ANKLE FRACTURE Right 01/05/2016   Procedure: OPEN REDUCTION INTERNAL FIXATION (ORIF) ANKLE FRACTURE;  Surgeon: Jasmine Mesi, MD;  Location: [facility];  Service: Orthopedics;  Laterality: Right;   OVARIAN CYST SURGERY Right 03/05/2007   AND CHROMOPERTUBATION  VIA LAPAROSCOPY   ROBOTIC ASSISTED LAPAROSCOPIC HYSTERECTOMY AND SALPINGECTOMY Bilateral 08/27/2018   Procedure: XI ROBOTIC ASSISTED LAPAROSCOPIC HYSTERECTOMY AND BILATERAL SALPINGECTOMY;  Surgeon: Meriam Stamp, MD;  Location: [facility];  Service: Gynecology;  Laterality: Bilateral;  [facility] for 23hr OBS   SYNOVECTOMY Right 12/13/2014   Procedure: PLICA SYNOVECTOMY;  Surgeon: Forbes Ida., MD;  Location: [facility];  Service: Orthopedics;  Laterality: Right;   TONSILLECTOMY AND ADENOIDECTOMY  child   WISDOM TOOTH EXTRACTION     Social History   Socioeconomic History   Marital status: Married    Spouse name: Not on file   Number of children: 2   Years of education: Not on file   Highest education level: Not on file  Occupational History   Occupation: Investment banker, 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  Tobacco Use   Smoking status: Former    Current packs/day: 0.50    Average packs/day: 0.5 packs/day for 15.0 years (7.5 ttl pk-yrs)    Types: Cigarettes   Smokeless tobacco: Never  Vaping Use   Vaping status: Every Day   Substances: Nicotine   Substance and Sexual Activity   Alcohol use: Yes    Comment: occasional    Drug use: No   Sexual activity: Yes    Birth control/protection: Surgical    Comment: partial hysterectomy  Other Topics Concern   Not on file  Social History Narrative   Not on file   Social Drivers of Health   Financial Resource Strain: Not on file  Food Insecurity: Not on file  Transportation Needs: Not on file  Physical Activity: Not on file  Stress: Not on file  Social Connections: Unknown (03/26/2022)   Received from [facility]   Social Network    Social Network: Not on file   Family History  Problem Relation Age of Onset   Learning disabilities Mother    Asthma Mother    Arthritis Mother    Irritable bowel syndrome Mother  Miscarriages / Stillbirths Mother    Hypertension Father    Early death Father    Drug abuse Father    Depression Father    Cancer Father    Arthritis Father    Alcohol abuse Father    Brain cancer Father    Anesthesia problems Father        hard to wake up post-op   Lung cancer Father    Hyperlipidemia Maternal Grandmother    Hypertension Maternal Grandmother    Cancer Maternal Grandmother    Asthma Maternal Grandmother    Arthritis Maternal Grandmother    Breast cancer Maternal Grandmother    Ovarian cancer Maternal Grandmother    Diabetes Maternal Grandmother    Heart disease Maternal Grandmother    Heart attack Maternal Grandmother    Hypertension Maternal Grandfather    Hyperlipidemia Maternal Grandfather    Arthritis Maternal Grandfather    Alcohol abuse Maternal Grandfather    Cancer Maternal Grandfather    Prostate cancer Maternal Grandfather    Melanoma Maternal Grandfather    Cancer - Prostate Maternal Grandfather    Hypertension Paternal Grandmother    COPD Paternal Grandmother    Arthritis Paternal Grandmother    Learning disabilities Daughter    Learning disabilities Son    Colon cancer Neg Hx    Allergies  Allergen Reactions   Droperidol  Other (See Comments)    Jittery and restlessness   Metoclopramide   Other (See Comments)    Reglan    Nickel Rash    any jewelry that's not real/ rash and itching    Prochlorperazine Other (See Comments)   Nsaids Other (See Comments)    DUE TO HISTORY OF GASTRIC ULCER   Tolmetin Itching and Other (See Comments)    DUE TO HISTORY OF GASTRIC ULCER  DUE TO HISTORY OF GASTRIC ULCER  DUE TO HISTORY OF GASTRIC ULCER, DUE TO HISTORY OF GASTRIC ULCER   Oxybutynin  Other (See Comments)    severe dry mouth   Adhesive [Tape] Rash   Metoclopramide  Hcl Other (See Comments)    RESTLESSNESS/JITTERY   Prochlorperazine Edisylate Other (See Comments)    RESTLESSNESS/JITTERY    Sulfa Antibiotics Rash   Current Outpatient Medications  Medication Sig Dispense Refill   meloxicam  (MOBIC ) 15 MG tablet Take 1 tablet (15 mg total) by mouth daily. 14 tablet 0   methylPREDNISolone  (MEDROL  DOSEPAK) 4 MG TBPK tablet Take per packet instructions 1 each 0   oxyCODONE  (ROXICODONE ) 5 MG immediate release tablet Take 1 tablet (5 mg total) by mouth every 4 (four) hours as needed. 15 tablet 0   ADDERALL XR 20 MG 24 hr capsule Take 20 mg by mouth every morning.  0   albuterol  (PROVENTIL  HFA;VENTOLIN  HFA) 108 (90 Base) MCG/ACT inhaler Inhale 2 puffs into the lungs every 6 (six) hours as needed for wheezing or shortness of breath.      ALPRAZolam (XANAX) 0.25 MG tablet SMARTSIG:0.5 Tablet(s) By Mouth PRN     aspirin  EC 325 MG tablet Take 1 tablet (325 mg total) by mouth daily. 14 tablet 0   aspirin  EC 81 MG tablet Take 1 tablet (81 mg total) by mouth daily. Swallow whole. 14 tablet 0   atorvastatin  (LIPITOR) 40 MG tablet Take 1 tablet (40 mg total) by mouth daily. 15 tablet 0   Bacillus Coagulans-Inulin (PROBIOTIC) 1-250 BILLION-MG CAPS      cetirizine (ZYRTEC) 10 MG tablet Take by mouth.     diclofenac  Sodium (VOLTAREN ) 1 % GEL APPLY TO  AFFECTED AREA AS INSTRUCTED     EMGALITY 120 MG/ML SOAJ Inject 120 mg into the skin every 30 (thirty) days.  4   estradiol  (VIVELLE -DOT) 0.05  MG/24HR patch Place 1 patch (0.05 mg total) onto the skin 2 (two) times a week. 8 patch 12   Fezolinetant  (VEOZAH ) 45 MG TABS Take 1 tablet (45 mg total) by mouth daily at 6 (six) AM. 30 tablet 11   fluticasone  (FLONASE ) 50 MCG/ACT nasal spray Place 2 sprays into both nostrils daily. 16 g 6   gabapentin  (NEURONTIN ) 300 MG capsule TAKE 2 CAPSULES BY MOUTH 3 TIMES DAILY (Patient taking differently: Take 600-1,200 mg by mouth See admin instructions. Take 600mg  by mouth in the morning and 1,200mg  in the evening) 180 capsule 4   ketoconazole  (NIZORAL ) 2 % cream Apply 1 Application topically daily. 15 g 0   lubiprostone (AMITIZA) 24 MCG capsule TAKE ONE CAPSULE (24 MCG DOSE) BY MOUTH 2 (TWO) TIMES DAILY WITH MEALS. 60 capsule 1   magnesium  oxide (MAG-OX) 400 MG tablet Take by mouth.     MELATONIN GUMMIES PO Take 1-2 each by mouth at bedtime.     mirtazapine (REMERON) 30 MG tablet Take 30 mg by mouth daily.     Multiple Vitamins-Minerals (EQL CENTURY WOMENS) TABS      NUCYNTA 100 MG TABS Take 1 tablet by mouth every 8 (eight) hours as needed.     omeprazole (PRILOSEC) 40 MG capsule Take 40 mg by mouth daily.     ondansetron  (ZOFRAN -ODT) 8 MG disintegrating tablet Take 1 tablet by mouth every 8 (eight) hours as needed.     pantoprazole  (PROTONIX ) 20 MG tablet Take 20 mg by mouth 2 (two) times daily.     promethazine  (PHENERGAN ) 25 MG tablet Take 1 tablet (25 mg total) by mouth every 8 (eight) hours as needed for nausea or vomiting. 10 tablet 0   Rimegepant Sulfate (NURTEC) 75 MG TBDP Nurtec ODT 75 mg disintegrating tablet  TAKE ONE TABLET BY MOUTH AS NEEDED. one PER 24 hours     Saline (SIMPLY SALINE) 0.9 % AERS Place 2 each into the nose as directed. Use nightly for sinus hygiene long-term.  Can also be used as many times daily as desired to assist with clearing congested sinuses. 127 mL 11   senna-docusate (SENOKOT-S) 8.6-50 MG tablet Take 1 tablet by mouth daily. 10 tablet 0   tirzepatide  (ZEPBOUND )  2.5 MG/0.5ML Pen Inject 2.5 mg into the skin once a week. 2 mL 11   tiZANidine  (ZANAFLEX ) 4 MG tablet Take 4 mg by mouth every 6 (six) hours as needed for muscle spasms.      traZODone (DESYREL) 50 MG tablet Take 1-2 tablets by mouth at bedtime as needed.     UBRELVY 100 MG TABS Take by mouth as needed.     venlafaxine  XR (EFFEXOR -XR) 150 MG 24 hr capsule Take 2 capsules (300 mg total) by mouth daily before breakfast. 60 capsule 11   zonisamide (ZONEGRAN) 100 MG capsule Take 100 mg by mouth at bedtime.     No current facility-administered medications for this visit.   No results found.  Review of Systems:   A ROS was performed including pertinent positives and negatives as documented in the HPI.   Musculoskeletal Exam:     Left knee incisions are well-appearing without erythema or drainage.  Mild effusion.  Positive patellar grind.  Negative patellar laxity with the knee in full extension.  Range of motion is from 0  to 100 degrees.  Imaging:      I personally reviewed and interpreted the radiographs.   Assessment:   Status post left knee arthroscopy with chondral biopsy and meniscal repair.  Today's visit I did describe that I would like her to progress out of her brace as she gets full extension and good quad strength to lift the leg 5 times.  We did also discuss about her chondral defect under the patella as well as the trochlea.  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  Unfortunately given her young age I do believe she is at risk for progression of this.  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  I did discuss the associated risks associated with this per surgery.  I did discuss the possibility and associated recovery timeframe.  After discussion she will plan to proceed with this  Plan :    - Plan for left knee  arthrotomy with patella and trochlear matrix associated chondrocyte implantation    After a lengthy discussion of treatment options, including risks, benefits, alternatives, complications of surgical and nonsurgical conservative options, the patient elected surgical repair.   The patient  is aware of the material risks  and complications including, but not limited to injury to adjacent structures, neurovascular injury, infection, numbness, bleeding, implant failure, thermal burns, stiffness, persistent pain, failure to heal, disease transmission from allograft, need for further surgery, dislocation, anesthetic risks, blood clots, risks of death,and others. The probabilities of surgical success and failure discussed with patient given their particular co-morbidities.The time and nature of expected rehabilitation and recovery was discussed.The patient's questions were all answered preoperatively.  No barriers to understanding were noted. I explained the natural history of the disease process and Rx rationale.  I explained to the patient what I considered to be reasonable expectations given their personal situation.  The final treatment plan was arrived at through a shared patient decision making process model.       I personally saw and evaluated the patient, and participated in the management and treatment plan.  Wilhelmenia Harada, MD Attending Physician, Orthopedic Surgery  This document was dictated using Dragon voice recognition software. A reasonable attempt at proof reading has been made to minimize errors.

## 2024-05-09 ENCOUNTER — Other Ambulatory Visit: Payer: Self-pay | Admitting: Internal Medicine

## 2024-05-09 ENCOUNTER — Other Ambulatory Visit (HOSPITAL_BASED_OUTPATIENT_CLINIC_OR_DEPARTMENT_OTHER): Payer: Self-pay

## 2024-05-11 ENCOUNTER — Encounter (HOSPITAL_BASED_OUTPATIENT_CLINIC_OR_DEPARTMENT_OTHER): Admitting: Physical Therapy

## 2024-05-12 ENCOUNTER — Ambulatory Visit (HOSPITAL_BASED_OUTPATIENT_CLINIC_OR_DEPARTMENT_OTHER): Admitting: Orthopaedic Surgery

## 2024-05-12 ENCOUNTER — Ambulatory Visit (HOSPITAL_BASED_OUTPATIENT_CLINIC_OR_DEPARTMENT_OTHER): Payer: Self-pay | Admitting: Orthopaedic Surgery

## 2024-05-12 DIAGNOSIS — S83262A Peripheral tear of lateral meniscus, current injury, left knee, initial encounter: Secondary | ICD-10-CM

## 2024-05-12 NOTE — H&P (View-Only) (Signed)
 Post Operative Evaluation    Procedure/Date of Surgery: Status post left knee medial meniscal repair arthroscopic debridement 2 weeks prior  Interval History:    Presents status post the above procedure.  She is still having persistent popping about the patellofemoral joint as well as pain.  She has been placed on weight loss medication and has been dieting and losing weight recently.   PMH/PSH/Family History/Social History/Meds/Allergies:    Past Medical History:  Diagnosis Date   ADD (attention deficit disorder with hyperactivity)    Allergy     Anxiety    Arthritis    Atypical chest pain 07/26/2021   Atypical chest pain     Benign intracranial hypertension 06/16/2008   Qualifier: Diagnosis of   By: Zimonjic, Milica         Choking    due to food and pills get stuck   Cholelithiasis and acute cholecystitis without obstruction 12/02/2016   Chronic cough 12/27/2013   Depression    Dry mouth 08/12/2017   Dysmenorrhea    Family history of adverse reaction to anesthesia    pt's father has hx. of being hard to wake up post-op   Gastric ulcer 03/16/2015   GERD (gastroesophageal reflux disease)    Headache 07/09/2007   Qualifier: Diagnosis of   By: Tammie MD, Bruce      Replacing diagnoses that were inactivated after the 02/16/23 regulatory import     History of gastric ulcer    History of kidney stones    Hyperlipidemia    IBS (irritable bowel syndrome)    Lower extremity edema 07/26/2021   Lower extremity edema     Migraines    neurologist-  dr c. hagen ([facility] in [place])-- treated with Emgality injection every 30 days   Mild obstructive sleep apnea    per study in epic 03-21-2017 mild osa, recommendation cpap, mouth appliance, wt loss   Nausea and vomiting 12/03/2016   OAB (overactive bladder) 08/12/2017   Osteoarthritis    right ankle   Osteochondral lesion 09/2016   right ankle   Osteomyelitis of knee region  [facility])    Peripheral vascular disease (HCC)    NERVE DAMAGE RIGHT LEG AND FOOT DUE TO INJURGY.    Plantar fasciitis of left foot    Pleural effusion 12/15/2013   PONV (postoperative nausea and vomiting)    AND HEADACHES   Pseudotumor cerebri    at back head   RUQ pain    S/P dilatation of esophageal stricture 07/2018   Sinusitis 08/12/2018   RESOLVED WITH ANTIOBIOTIC   SUI (stress urinary incontinence, female)    Wears contact lenses    Wears partial dentures    upper   Past Surgical History:  Procedure Laterality Date   ANKLE ARTHROSCOPY Right 10/22/2016   Procedure: ANKLE ARTHROSCOPY;  Surgeon: Donnice JONELLE Fees, DPM;  Location: [facility];  Service: Podiatry;  Laterality: Right;  GENERAL/REG BLOCK   ANKLE ARTHROSCOPY Right 09-15-2017   @[facility]    BLADDER SURGERY  child   x 2 - as a child to stretch bladder   CESAREAN SECTION  12/24/2009   CESAREAN SECTION  11/20/2011   Procedure: CESAREAN SECTION;  Surgeon: Charlie JINNY Flowers, MD;  Location: [facility];  Service: Gynecology;  Laterality: N/A;   CHOLECYSTECTOMY N/A 12/03/2016  Procedure: LAPAROSCOPIC CHOLECYSTECTOMY WITH INTRAOPERATIVE CHOLANGIOGRAM;  Surgeon: Krystal Spinner, MD;  Location: [facility];  Service: General;  Laterality: N/A;   CHONDROPLASTY Right 01/12/2024   Procedure: RIGHT KNEE ARTHROSCOPY LATERAL FEMORAL CHONDROPLASTY;  Surgeon: Genelle Standing, MD;  Location: [facility];  Service: Orthopedics;  Laterality: Right;   CYSTOSCOPY WITH RETROGRADE PYELOGRAM, URETEROSCOPY AND STENT PLACEMENT Left 08/10/2006   ESOPHAGOGASTRODUODENOSCOPY (EGD) WITH ESOPHAGEAL DILATION  07/2018   HARDWARE REMOVAL Right 07/01/2016   Procedure: HARDWARE REMOVAL;  Surgeon: Glendia Cordella Hutchinson, MD;  Location: [facility];  Service: Orthopedics;  Laterality: Right;   HARVEST BONE GRAFT Right 01/12/2024   Procedure: RIGHT BONE MARROW ASPIRATE FROM ILIAC CREST DECOMPRESSION AND PLACEMENT RIGHT LATERAL FEMORAL CONDYLE;  Surgeon: Genelle Standing, MD;  Location: [facility];  Service: Orthopedics;  Laterality: Right;   KNEE ARTHROSCOPY WITH LATERAL RELEASE Right 12/13/2014   Procedure: KNEE ARTHROSCOPY WITH LATERAL RELEASE;  Surgeon: LELON JONETTA Shari Mickey., MD;  Location: [facility];  Service: Orthopedics;  Laterality: Right;   LAPAROSCOPIC APPENDECTOMY N/A 06/11/2014   Procedure: APPENDECTOMY LAPAROSCOPIC;  Surgeon: Krystal CHRISTELLA Spinner, MD;  Location: [facility];  Service: General;  Laterality: N/A;   ORIF ANKLE FRACTURE Right 01/05/2016   Procedure: OPEN REDUCTION INTERNAL FIXATION (ORIF) ANKLE FRACTURE;  Surgeon: Glendia Cordella Hutchinson, MD;  Location: [facility];  Service: Orthopedics;  Laterality: Right;   OVARIAN CYST SURGERY Right 03/05/2007   AND CHROMOPERTUBATION  VIA LAPAROSCOPY   ROBOTIC ASSISTED LAPAROSCOPIC HYSTERECTOMY AND SALPINGECTOMY Bilateral 08/27/2018   Procedure: XI ROBOTIC ASSISTED LAPAROSCOPIC HYSTERECTOMY AND BILATERAL SALPINGECTOMY;  Surgeon: Gorge Ade, MD;  Location: [facility];  Service: Gynecology;  Laterality: Bilateral;  [facility] for 23hr OBS   SYNOVECTOMY Right 12/13/2014   Procedure: PLICA SYNOVECTOMY;  Surgeon: LELON JONETTA Shari Mickey., MD;  Location: [facility];  Service: Orthopedics;  Laterality: Right;   TONSILLECTOMY AND ADENOIDECTOMY  child   WISDOM TOOTH EXTRACTION     Social History   Socioeconomic History   Marital status: Married    Spouse name: Not on file   Number of children: 2   Years of education: Not on file   Highest education level: Not on file  Occupational History   Occupation: Investment banker, 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  Tobacco Use   Smoking status: Former    Current packs/day: 0.50    Average packs/day: 0.5 packs/day for 15.0 years (7.5 ttl pk-yrs)    Types: Cigarettes   Smokeless tobacco: Never  Vaping Use   Vaping status: Every Day   Substances: Nicotine   Substance and Sexual Activity   Alcohol use: Yes    Comment: occasional   Drug use: No    Sexual activity: Yes    Birth control/protection: Surgical    Comment: partial hysterectomy  Other Topics Concern   Not on file  Social History Narrative   Not on file   Social Drivers of Health   Financial Resource Strain: Not on file  Food Insecurity: Not on file  Transportation Needs: Not on file  Physical Activity: Not on file  Stress: Not on file  Social Connections: Unknown (03/26/2022)   [facility]   Social Network    Social Network: Not on file   Family History  Problem Relation Age of Onset   Learning disabilities Mother    Asthma Mother    Arthritis Mother    Irritable bowel syndrome Mother    Miscarriages /  Stillbirths Mother    Hypertension Father    Early death Father    Drug abuse Father    Depression Father    Cancer Father    Arthritis Father    Alcohol abuse Father    Brain cancer Father    Anesthesia problems Father        hard to wake up post-op   Lung cancer Father    Hyperlipidemia Maternal Grandmother    Hypertension Maternal Grandmother    Cancer Maternal Grandmother    Asthma Maternal Grandmother    Arthritis Maternal Grandmother    Breast cancer Maternal Grandmother    Ovarian cancer Maternal Grandmother    Diabetes Maternal Grandmother    Heart disease Maternal Grandmother    Heart attack Maternal Grandmother    Hypertension Maternal Grandfather    Hyperlipidemia Maternal Grandfather    Arthritis Maternal Grandfather    Alcohol abuse Maternal Grandfather    Cancer Maternal Grandfather    Prostate cancer Maternal Grandfather    Melanoma Maternal Grandfather    Cancer - Prostate Maternal Grandfather    Hypertension Paternal Grandmother    COPD Paternal Grandmother    Arthritis Paternal Grandmother    Learning disabilities Daughter    Learning disabilities Son    Colon cancer Neg Hx    Allergies  Allergen Reactions   Droperidol  Other (See Comments)    Jittery and restlessness   Metoclopramide  Other (See  Comments)    Reglan    Nickel Rash    any jewelry that's not real/ rash and itching    Prochlorperazine Other (See Comments)   Nsaids Other (See Comments)    DUE TO HISTORY OF GASTRIC ULCER   Tolmetin Itching and Other (See Comments)    DUE TO HISTORY OF GASTRIC ULCER  DUE TO HISTORY OF GASTRIC ULCER  DUE TO HISTORY OF GASTRIC ULCER, DUE TO HISTORY OF GASTRIC ULCER   Oxybutynin  Other (See Comments)    severe dry mouth   Adhesive [Tape] Rash   Metoclopramide  Hcl Other (See Comments)    RESTLESSNESS/JITTERY   Prochlorperazine Edisylate Other (See Comments)    RESTLESSNESS/JITTERY    Sulfa Antibiotics Rash   Current Outpatient Medications  Medication Sig Dispense Refill   meloxicam  (MOBIC ) 15 MG tablet Take 1 tablet (15 mg total) by mouth daily. 14 tablet 0   methylPREDNISolone  (MEDROL  DOSEPAK) 4 MG TBPK tablet Take per packet instructions 1 each 0   oxyCODONE  (ROXICODONE ) 5 MG immediate release tablet Take 1 tablet (5 mg total) by mouth every 4 (four) hours as needed. 15 tablet 0   acetaminophen  (TYLENOL ) 500 MG tablet Take 1 tablet (500 mg total) by mouth every 8 (eight) hours for 10 days. 30 tablet 0   ADDERALL XR 20 MG 24 hr capsule Take 20 mg by mouth every morning.  0   albuterol  (PROVENTIL  HFA;VENTOLIN  HFA) 108 (90 Base) MCG/ACT inhaler Inhale 2 puffs into the lungs every 6 (six) hours as needed for wheezing or shortness of breath.      ALPRAZolam (XANAX) 0.25 MG tablet SMARTSIG:0.5 Tablet(s) By Mouth PRN     aspirin  EC 325 MG tablet Take 1 tablet (325 mg total) by mouth daily. 14 tablet 0   aspirin  EC 81 MG tablet Take 1 tablet (81 mg total) by mouth daily. Swallow whole. 14 tablet 0   atorvastatin  (LIPITOR) 40 MG tablet Take 1 tablet (40 mg total) by mouth daily. 15 tablet 0   Bacillus Coagulans-Inulin (PROBIOTIC) 1-250 BILLION-MG CAPS  cetirizine (ZYRTEC) 10 MG tablet Take by mouth.     diclofenac  Sodium (VOLTAREN ) 1 % GEL APPLY TO AFFECTED AREA AS INSTRUCTED      EMGALITY 120 MG/ML SOAJ Inject 120 mg into the skin every 30 (thirty) days.  4   estradiol  (VIVELLE -DOT) 0.05 MG/24HR patch Place 1 patch (0.05 mg total) onto the skin 2 (two) times a week. 8 patch 12   Fezolinetant  (VEOZAH ) 45 MG TABS Take 1 tablet (45 mg total) by mouth daily at 6 (six) AM. 30 tablet 11   fluticasone  (FLONASE ) 50 MCG/ACT nasal spray Place 2 sprays into both nostrils daily. 16 g 6   gabapentin  (NEURONTIN ) 300 MG capsule TAKE 2 CAPSULES BY MOUTH 3 TIMES DAILY (Patient taking differently: Take 600-1,200 mg by mouth See admin instructions. Take 600mg  by mouth in the morning and 1,200mg  in the evening) 180 capsule 4   ibuprofen  (ADVIL ) 800 MG tablet Take 1 tablet (800 mg total) by mouth every 8 (eight) hours for 10 days. Please take with food, please alternate with acetaminophen  30 tablet 0   ketoconazole  (NIZORAL ) 2 % cream Apply 1 Application topically daily. 15 g 0   lubiprostone (AMITIZA) 24 MCG capsule TAKE ONE CAPSULE (24 MCG DOSE) BY MOUTH 2 (TWO) TIMES DAILY WITH MEALS. 60 capsule 1   magnesium  oxide (MAG-OX) 400 MG tablet Take by mouth.     MELATONIN GUMMIES PO Take 1-2 each by mouth at bedtime.     mirtazapine (REMERON) 30 MG tablet Take 30 mg by mouth daily.     Multiple Vitamins-Minerals (EQL CENTURY WOMENS) TABS      NUCYNTA 100 MG TABS Take 1 tablet by mouth every 8 (eight) hours as needed.     omeprazole (PRILOSEC) 40 MG capsule Take 40 mg by mouth daily.     ondansetron  (ZOFRAN -ODT) 8 MG disintegrating tablet Take 1 tablet by mouth every 8 (eight) hours as needed.     oxyCODONE  (ROXICODONE ) 5 MG immediate release tablet Take 1 tablet (5 mg total) by mouth every 4 (four) hours as needed for severe pain (pain score 7-10) or breakthrough pain. 20 tablet 0   pantoprazole  (PROTONIX ) 20 MG tablet Take 20 mg by mouth 2 (two) times daily.     promethazine  (PHENERGAN ) 25 MG tablet Take 1 tablet (25 mg total) by mouth every 8 (eight) hours as needed for nausea or vomiting. 10  tablet 0   Rimegepant Sulfate (NURTEC) 75 MG TBDP Nurtec ODT 75 mg disintegrating tablet  TAKE ONE TABLET BY MOUTH AS NEEDED. one PER 24 hours     Saline (SIMPLY SALINE) 0.9 % AERS Place 2 each into the nose as directed. Use nightly for sinus hygiene long-term.  Can also be used as many times daily as desired to assist with clearing congested sinuses. 127 mL 11   senna-docusate (SENOKOT-S) 8.6-50 MG tablet Take 1 tablet by mouth daily. 10 tablet 0   tirzepatide  (ZEPBOUND ) 2.5 MG/0.5ML Pen Inject 2.5 mg into the skin once a week. 2 mL 11   tiZANidine  (ZANAFLEX ) 4 MG tablet Take 4 mg by mouth every 6 (six) hours as needed for muscle spasms.      traZODone (DESYREL) 50 MG tablet Take 1-2 tablets by mouth at bedtime as needed.     UBRELVY 100 MG TABS Take by mouth as needed.     venlafaxine  XR (EFFEXOR -XR) 150 MG 24 hr capsule Take 2 capsules (300 mg total) by mouth daily before breakfast. 60 capsule 11   zonisamide (  ZONEGRAN) 100 MG capsule Take 100 mg by mouth at bedtime.     No current facility-administered medications for this visit.   No results found.  Review of Systems:   A ROS was performed including pertinent positives and negatives as documented in the HPI.   Musculoskeletal Exam:     Left knee incisions are well-appearing without erythema or drainage.  Mild effusion.  Positive patellar grind.  Negative patellar laxity with the knee in full extension.  Range of motion is from 0 to 100 degrees.  Imaging:      I personally reviewed and interpreted the radiographs.   Assessment:   Status post left knee arthroscopy with chondral biopsy and meniscal repair.  Today's visit I did describe that I would like her to progress out of her brace as she gets full extension and good quad strength to lift the leg 5 times.  We did also discuss about her chondral defect under the patella as well as the trochlea.  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  Unfortunately given her young age I do believe she is at risk for progression of this.  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  I did discuss the associated risks associated with this per surgery.  I did discuss the possibility and associated recovery timeframe.  After discussion she will plan to proceed with this  I did discuss that overall today's visit her BMI is much improved with 30 weight loss medication and diet and exercise.  Given this I do believe that she will be safe to proceed with surgical intervention.  I did also discuss the role for diagnostic arthroscopy as well as I would like to visualize her meniscal repair healing status.  Plan :    - Plan for left knee arthroscopy with arthrotomy with patella and trochlear matrix associated chondrocyte implantation    After a lengthy discussion of treatment options, including risks, benefits, alternatives, complications of surgical and nonsurgical conservative options, the patient elected surgical repair.   The patient  is aware of the material risks  and complications including, but not limited to injury to adjacent structures, neurovascular injury, infection, numbness, bleeding, implant failure, thermal burns, stiffness, persistent pain, failure to heal, disease transmission from allograft, need for further surgery, dislocation, anesthetic risks, blood clots, risks of death,and others. The probabilities of surgical success and failure discussed with patient given their particular co-morbidities.The time and nature of expected rehabilitation and recovery was discussed.The patient's questions were all answered preoperatively.  No barriers to understanding were noted. I explained the natural history of the disease process and Rx rationale.  I explained to the patient what I considered to be reasonable expectations given  their personal situation.  The final treatment plan was arrived at through a shared patient decision making process model.       I personally saw and evaluated the patient, and participated in the management and treatment plan.  Elspeth Parker, MD Attending Physician, Orthopedic Surgery  This document was dictated using Dragon voice recognition software. A reasonable attempt at proof reading has been made to minimize errors.

## 2024-05-12 NOTE — Progress Notes (Signed)
 Post Operative Evaluation    Procedure/Date of Surgery: Status post left knee medial meniscal repair arthroscopic debridement 2 weeks prior  Interval History:    Presents status post the above procedure.  She is still having persistent popping about the patellofemoral joint as well as pain.  She has been placed on weight loss medication and has been dieting and losing weight recently.   PMH/PSH/Family History/Social History/Meds/Allergies:    Past Medical History:  Diagnosis Date   ADD (attention deficit disorder with hyperactivity)    Allergy     Anxiety    Arthritis    Atypical chest pain 07/26/2021   Atypical chest pain     Benign intracranial hypertension 06/16/2008   Qualifier: Diagnosis of   By: Zimonjic, Milica         Choking    due to food and pills get stuck   Cholelithiasis and acute cholecystitis without obstruction 12/02/2016   Chronic cough 12/27/2013   Depression    Dry mouth 08/12/2017   Dysmenorrhea    Family history of adverse reaction to anesthesia    pt's father has hx. of being hard to wake up post-op   Gastric ulcer 03/16/2015   GERD (gastroesophageal reflux disease)    Headache 07/09/2007   Qualifier: Diagnosis of   By: Tammie MD, Bruce      Replacing diagnoses that were inactivated after the 02/16/23 regulatory import     History of gastric ulcer    History of kidney stones    Hyperlipidemia    IBS (irritable bowel syndrome)    Lower extremity edema 07/26/2021   Lower extremity edema     Migraines    neurologist-  dr c. hagen ([facility] in [place])-- treated with Emgality injection every 30 days   Mild obstructive sleep apnea    per study in epic 03-21-2017 mild osa, recommendation cpap, mouth appliance, wt loss   Nausea and vomiting 12/03/2016   OAB (overactive bladder) 08/12/2017   Osteoarthritis    right ankle   Osteochondral lesion 09/2016   right ankle   Osteomyelitis of knee region  [facility])    Peripheral vascular disease (HCC)    NERVE DAMAGE RIGHT LEG AND FOOT DUE TO INJURGY.    Plantar fasciitis of left foot    Pleural effusion 12/15/2013   PONV (postoperative nausea and vomiting)    AND HEADACHES   Pseudotumor cerebri    at back head   RUQ pain    S/P dilatation of esophageal stricture 07/2018   Sinusitis 08/12/2018   RESOLVED WITH ANTIOBIOTIC   SUI (stress urinary incontinence, female)    Wears contact lenses    Wears partial dentures    upper   Past Surgical History:  Procedure Laterality Date   ANKLE ARTHROSCOPY Right 10/22/2016   Procedure: ANKLE ARTHROSCOPY;  Surgeon: Donnice JONELLE Fees, DPM;  Location: [facility];  Service: Podiatry;  Laterality: Right;  GENERAL/REG BLOCK   ANKLE ARTHROSCOPY Right 09-15-2017   @[facility]    BLADDER SURGERY  child   x 2 - as a child to stretch bladder   CESAREAN SECTION  12/24/2009   CESAREAN SECTION  11/20/2011   Procedure: CESAREAN SECTION;  Surgeon: Charlie JINNY Flowers, MD;  Location: [facility];  Service: Gynecology;  Laterality: N/A;   CHOLECYSTECTOMY N/A 12/03/2016  Procedure: LAPAROSCOPIC CHOLECYSTECTOMY WITH INTRAOPERATIVE CHOLANGIOGRAM;  Surgeon: Krystal Spinner, MD;  Location: [facility];  Service: General;  Laterality: N/A;   CHONDROPLASTY Right 01/12/2024   Procedure: RIGHT KNEE ARTHROSCOPY LATERAL FEMORAL CHONDROPLASTY;  Surgeon: Genelle Standing, MD;  Location: [facility];  Service: Orthopedics;  Laterality: Right;   CYSTOSCOPY WITH RETROGRADE PYELOGRAM, URETEROSCOPY AND STENT PLACEMENT Left 08/10/2006   ESOPHAGOGASTRODUODENOSCOPY (EGD) WITH ESOPHAGEAL DILATION  07/2018   HARDWARE REMOVAL Right 07/01/2016   Procedure: HARDWARE REMOVAL;  Surgeon: Glendia Cordella Hutchinson, MD;  Location: [facility];  Service: Orthopedics;  Laterality: Right;   HARVEST BONE GRAFT Right 01/12/2024   Procedure: RIGHT BONE MARROW ASPIRATE FROM ILIAC CREST DECOMPRESSION AND PLACEMENT RIGHT LATERAL FEMORAL CONDYLE;  Surgeon: Genelle Standing, MD;  Location: [facility];  Service: Orthopedics;  Laterality: Right;   KNEE ARTHROSCOPY WITH LATERAL RELEASE Right 12/13/2014   Procedure: KNEE ARTHROSCOPY WITH LATERAL RELEASE;  Surgeon: LELON JONETTA Shari Mickey., MD;  Location: [facility];  Service: Orthopedics;  Laterality: Right;   LAPAROSCOPIC APPENDECTOMY N/A 06/11/2014   Procedure: APPENDECTOMY LAPAROSCOPIC;  Surgeon: Krystal CHRISTELLA Spinner, MD;  Location: [facility];  Service: General;  Laterality: N/A;   ORIF ANKLE FRACTURE Right 01/05/2016   Procedure: OPEN REDUCTION INTERNAL FIXATION (ORIF) ANKLE FRACTURE;  Surgeon: Glendia Cordella Hutchinson, MD;  Location: [facility];  Service: Orthopedics;  Laterality: Right;   OVARIAN CYST SURGERY Right 03/05/2007   AND CHROMOPERTUBATION  VIA LAPAROSCOPY   ROBOTIC ASSISTED LAPAROSCOPIC HYSTERECTOMY AND SALPINGECTOMY Bilateral 08/27/2018   Procedure: XI ROBOTIC ASSISTED LAPAROSCOPIC HYSTERECTOMY AND BILATERAL SALPINGECTOMY;  Surgeon: Gorge Ade, MD;  Location: [facility];  Service: Gynecology;  Laterality: Bilateral;  [facility] for 23hr OBS   SYNOVECTOMY Right 12/13/2014   Procedure: PLICA SYNOVECTOMY;  Surgeon: LELON JONETTA Shari Mickey., MD;  Location: [facility];  Service: Orthopedics;  Laterality: Right;   TONSILLECTOMY AND ADENOIDECTOMY  child   WISDOM TOOTH EXTRACTION     Social History   Socioeconomic History   Marital status: Married    Spouse name: Not on file   Number of children: 2   Years of education: Not on file   Highest education level: Not on file  Occupational History   Occupation: Investment banker, 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  Tobacco Use   Smoking status: Former    Current packs/day: 0.50    Average packs/day: 0.5 packs/day for 15.0 years (7.5 ttl pk-yrs)    Types: Cigarettes   Smokeless tobacco: Never  Vaping Use   Vaping status: Every Day   Substances: Nicotine   Substance and Sexual Activity   Alcohol use: Yes    Comment: occasional   Drug use: No    Sexual activity: Yes    Birth control/protection: Surgical    Comment: partial hysterectomy  Other Topics Concern   Not on file  Social History Narrative   Not on file   Social Drivers of Health   Financial Resource Strain: Not on file  Food Insecurity: Not on file  Transportation Needs: Not on file  Physical Activity: Not on file  Stress: Not on file  Social Connections: Unknown (03/26/2022)   [facility]   Social Network    Social Network: Not on file   Family History  Problem Relation Age of Onset   Learning disabilities Mother    Asthma Mother    Arthritis Mother    Irritable bowel syndrome Mother    Miscarriages /  Stillbirths Mother    Hypertension Father    Early death Father    Drug abuse Father    Depression Father    Cancer Father    Arthritis Father    Alcohol abuse Father    Brain cancer Father    Anesthesia problems Father        hard to wake up post-op   Lung cancer Father    Hyperlipidemia Maternal Grandmother    Hypertension Maternal Grandmother    Cancer Maternal Grandmother    Asthma Maternal Grandmother    Arthritis Maternal Grandmother    Breast cancer Maternal Grandmother    Ovarian cancer Maternal Grandmother    Diabetes Maternal Grandmother    Heart disease Maternal Grandmother    Heart attack Maternal Grandmother    Hypertension Maternal Grandfather    Hyperlipidemia Maternal Grandfather    Arthritis Maternal Grandfather    Alcohol abuse Maternal Grandfather    Cancer Maternal Grandfather    Prostate cancer Maternal Grandfather    Melanoma Maternal Grandfather    Cancer - Prostate Maternal Grandfather    Hypertension Paternal Grandmother    COPD Paternal Grandmother    Arthritis Paternal Grandmother    Learning disabilities Daughter    Learning disabilities Son    Colon cancer Neg Hx    Allergies  Allergen Reactions   Droperidol  Other (See Comments)    Jittery and restlessness   Metoclopramide  Other (See  Comments)    Reglan    Nickel Rash    any jewelry that's not real/ rash and itching    Prochlorperazine Other (See Comments)   Nsaids Other (See Comments)    DUE TO HISTORY OF GASTRIC ULCER   Tolmetin Itching and Other (See Comments)    DUE TO HISTORY OF GASTRIC ULCER  DUE TO HISTORY OF GASTRIC ULCER  DUE TO HISTORY OF GASTRIC ULCER, DUE TO HISTORY OF GASTRIC ULCER   Oxybutynin  Other (See Comments)    severe dry mouth   Adhesive [Tape] Rash   Metoclopramide  Hcl Other (See Comments)    RESTLESSNESS/JITTERY   Prochlorperazine Edisylate Other (See Comments)    RESTLESSNESS/JITTERY    Sulfa Antibiotics Rash   Current Outpatient Medications  Medication Sig Dispense Refill   meloxicam  (MOBIC ) 15 MG tablet Take 1 tablet (15 mg total) by mouth daily. 14 tablet 0   methylPREDNISolone  (MEDROL  DOSEPAK) 4 MG TBPK tablet Take per packet instructions 1 each 0   oxyCODONE  (ROXICODONE ) 5 MG immediate release tablet Take 1 tablet (5 mg total) by mouth every 4 (four) hours as needed. 15 tablet 0   acetaminophen  (TYLENOL ) 500 MG tablet Take 1 tablet (500 mg total) by mouth every 8 (eight) hours for 10 days. 30 tablet 0   ADDERALL XR 20 MG 24 hr capsule Take 20 mg by mouth every morning.  0   albuterol  (PROVENTIL  HFA;VENTOLIN  HFA) 108 (90 Base) MCG/ACT inhaler Inhale 2 puffs into the lungs every 6 (six) hours as needed for wheezing or shortness of breath.      ALPRAZolam (XANAX) 0.25 MG tablet SMARTSIG:0.5 Tablet(s) By Mouth PRN     aspirin  EC 325 MG tablet Take 1 tablet (325 mg total) by mouth daily. 14 tablet 0   aspirin  EC 81 MG tablet Take 1 tablet (81 mg total) by mouth daily. Swallow whole. 14 tablet 0   atorvastatin  (LIPITOR) 40 MG tablet Take 1 tablet (40 mg total) by mouth daily. 15 tablet 0   Bacillus Coagulans-Inulin (PROBIOTIC) 1-250 BILLION-MG CAPS  cetirizine (ZYRTEC) 10 MG tablet Take by mouth.     diclofenac  Sodium (VOLTAREN ) 1 % GEL APPLY TO AFFECTED AREA AS INSTRUCTED      EMGALITY 120 MG/ML SOAJ Inject 120 mg into the skin every 30 (thirty) days.  4   estradiol  (VIVELLE -DOT) 0.05 MG/24HR patch Place 1 patch (0.05 mg total) onto the skin 2 (two) times a week. 8 patch 12   Fezolinetant  (VEOZAH ) 45 MG TABS Take 1 tablet (45 mg total) by mouth daily at 6 (six) AM. 30 tablet 11   fluticasone  (FLONASE ) 50 MCG/ACT nasal spray Place 2 sprays into both nostrils daily. 16 g 6   gabapentin  (NEURONTIN ) 300 MG capsule TAKE 2 CAPSULES BY MOUTH 3 TIMES DAILY (Patient taking differently: Take 600-1,200 mg by mouth See admin instructions. Take 600mg  by mouth in the morning and 1,200mg  in the evening) 180 capsule 4   ibuprofen  (ADVIL ) 800 MG tablet Take 1 tablet (800 mg total) by mouth every 8 (eight) hours for 10 days. Please take with food, please alternate with acetaminophen  30 tablet 0   ketoconazole  (NIZORAL ) 2 % cream Apply 1 Application topically daily. 15 g 0   lubiprostone (AMITIZA) 24 MCG capsule TAKE ONE CAPSULE (24 MCG DOSE) BY MOUTH 2 (TWO) TIMES DAILY WITH MEALS. 60 capsule 1   magnesium  oxide (MAG-OX) 400 MG tablet Take by mouth.     MELATONIN GUMMIES PO Take 1-2 each by mouth at bedtime.     mirtazapine (REMERON) 30 MG tablet Take 30 mg by mouth daily.     Multiple Vitamins-Minerals (EQL CENTURY WOMENS) TABS      NUCYNTA 100 MG TABS Take 1 tablet by mouth every 8 (eight) hours as needed.     omeprazole (PRILOSEC) 40 MG capsule Take 40 mg by mouth daily.     ondansetron  (ZOFRAN -ODT) 8 MG disintegrating tablet Take 1 tablet by mouth every 8 (eight) hours as needed.     oxyCODONE  (ROXICODONE ) 5 MG immediate release tablet Take 1 tablet (5 mg total) by mouth every 4 (four) hours as needed for severe pain (pain score 7-10) or breakthrough pain. 20 tablet 0   pantoprazole  (PROTONIX ) 20 MG tablet Take 20 mg by mouth 2 (two) times daily.     promethazine  (PHENERGAN ) 25 MG tablet Take 1 tablet (25 mg total) by mouth every 8 (eight) hours as needed for nausea or vomiting. 10  tablet 0   Rimegepant Sulfate (NURTEC) 75 MG TBDP Nurtec ODT 75 mg disintegrating tablet  TAKE ONE TABLET BY MOUTH AS NEEDED. one PER 24 hours     Saline (SIMPLY SALINE) 0.9 % AERS Place 2 each into the nose as directed. Use nightly for sinus hygiene long-term.  Can also be used as many times daily as desired to assist with clearing congested sinuses. 127 mL 11   senna-docusate (SENOKOT-S) 8.6-50 MG tablet Take 1 tablet by mouth daily. 10 tablet 0   tirzepatide  (ZEPBOUND ) 2.5 MG/0.5ML Pen Inject 2.5 mg into the skin once a week. 2 mL 11   tiZANidine  (ZANAFLEX ) 4 MG tablet Take 4 mg by mouth every 6 (six) hours as needed for muscle spasms.      traZODone (DESYREL) 50 MG tablet Take 1-2 tablets by mouth at bedtime as needed.     UBRELVY 100 MG TABS Take by mouth as needed.     venlafaxine  XR (EFFEXOR -XR) 150 MG 24 hr capsule Take 2 capsules (300 mg total) by mouth daily before breakfast. 60 capsule 11   zonisamide (  ZONEGRAN) 100 MG capsule Take 100 mg by mouth at bedtime.     No current facility-administered medications for this visit.   No results found.  Review of Systems:   A ROS was performed including pertinent positives and negatives as documented in the HPI.   Musculoskeletal Exam:     Left knee incisions are well-appearing without erythema or drainage.  Mild effusion.  Positive patellar grind.  Negative patellar laxity with the knee in full extension.  Range of motion is from 0 to 100 degrees.  Imaging:      I personally reviewed and interpreted the radiographs.   Assessment:   Status post left knee arthroscopy with chondral biopsy and meniscal repair.  Today's visit I did describe that I would like her to progress out of her brace as she gets full extension and good quad strength to lift the leg 5 times.  We did also discuss about her chondral defect under the patella as well as the trochlea.  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  Unfortunately given her young age I do believe she is at risk for progression of this.  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  I did discuss the associated risks associated with this per surgery.  I did discuss the possibility and associated recovery timeframe.  After discussion she will plan to proceed with this  I did discuss that overall today's visit her BMI is much improved with 30 weight loss medication and diet and exercise.  Given this I do believe that she will be safe to proceed with surgical intervention.  I did also discuss the role for diagnostic arthroscopy as well as I would like to visualize her meniscal repair healing status.  Plan :    - Plan for left knee arthroscopy with arthrotomy with patella and trochlear matrix associated chondrocyte implantation    After a lengthy discussion of treatment options, including risks, benefits, alternatives, complications of surgical and nonsurgical conservative options, the patient elected surgical repair.   The patient  is aware of the material risks  and complications including, but not limited to injury to adjacent structures, neurovascular injury, infection, numbness, bleeding, implant failure, thermal burns, stiffness, persistent pain, failure to heal, disease transmission from allograft, need for further surgery, dislocation, anesthetic risks, blood clots, risks of death,and others. The probabilities of surgical success and failure discussed with patient given their particular co-morbidities.The time and nature of expected rehabilitation and recovery was discussed.The patient's questions were all answered preoperatively.  No barriers to understanding were noted. I explained the natural history of the disease process and Rx rationale.  I explained to the patient what I considered to be reasonable expectations given  their personal situation.  The final treatment plan was arrived at through a shared patient decision making process model.       I personally saw and evaluated the patient, and participated in the management and treatment plan.  Elspeth Parker, MD Attending Physician, Orthopedic Surgery  This document was dictated using Dragon voice recognition software. A reasonable attempt at proof reading has been made to minimize errors.

## 2024-05-13 ENCOUNTER — Encounter (HOSPITAL_BASED_OUTPATIENT_CLINIC_OR_DEPARTMENT_OTHER): Admitting: Physical Therapy

## 2024-05-16 ENCOUNTER — Other Ambulatory Visit: Payer: Self-pay

## 2024-05-16 ENCOUNTER — Encounter (HOSPITAL_BASED_OUTPATIENT_CLINIC_OR_DEPARTMENT_OTHER): Admitting: Physical Therapy

## 2024-05-16 ENCOUNTER — Encounter (HOSPITAL_BASED_OUTPATIENT_CLINIC_OR_DEPARTMENT_OTHER): Payer: Self-pay | Admitting: Orthopaedic Surgery

## 2024-05-16 NOTE — Progress Notes (Signed)
   05/16/24 1540  PAT Phone Screen  Is the patient taking a GLP-1 receptor agonist? (S)  No (not approved by insurance, never started)  Do You Have Diabetes? No  Do You Have Hypertension? Yes  Have You Ever Been to the ER for Asthma? No  Have You Taken Oral Steroids in the Past 3 Months? No  Do you Take Phenteramine or any Other Diet Drugs? No  Recent  Lab Work, EKG, CXR? Yes  Where was this test performed? 02/29/24 [facility]  Do you have a history of heart problems? No  Any Recent Hospitalizations? No  Height 5' 7 (1.702 m)  Weight 108.9 kg  Pat Appointment Scheduled No  Reason for No Appointment Not Needed

## 2024-05-18 ENCOUNTER — Other Ambulatory Visit: Payer: Self-pay | Admitting: Internal Medicine

## 2024-05-18 ENCOUNTER — Encounter (HOSPITAL_BASED_OUTPATIENT_CLINIC_OR_DEPARTMENT_OTHER): Admitting: Physical Therapy

## 2024-05-19 NOTE — Anesthesia Preprocedure Evaluation (Addendum)
 Anesthesia Evaluation  Patient identified by MRN, date of birth, ID band Patient awake    Reviewed: Allergy  & Precautions, H&P , NPO status , Patient's Chart, lab work & pertinent test results  History of Anesthesia Complications (+) PONV and history of anesthetic complications  Airway Mallampati: I  TM Distance: >3 FB Neck ROM: Full    Dental  (+) Dental Advisory Given, Edentulous Upper, Chipped,    Pulmonary sleep apnea (was using cpap at some point but was restested within the year and does not need cpap anymore per pt) , former smoker   Pulmonary exam normal breath sounds clear to auscultation       Cardiovascular + Peripheral Vascular Disease  Normal cardiovascular exam Rhythm:Regular Rate:Normal     Neuro/Psych  Headaches PSYCHIATRIC DISORDERS Anxiety Depression       GI/Hepatic Neg liver ROS, PUD,GERD  Medicated and Controlled,,  Endo/Other  Obesity BMI 39  Renal/GU negative Renal ROS  negative genitourinary   Musculoskeletal  (+) Arthritis , Osteoarthritis,  Chronic pain: nucynta 100mg    Abdominal  (+) + obese  Peds negative pediatric ROS (+)  Hematology negative hematology ROS (+)   Anesthesia Other Findings Zepbound  LD: not taking yet  Reproductive/Obstetrics negative OB ROS                              Anesthesia Physical Anesthesia Plan  ASA: 3  Anesthesia Plan: General and Regional   Post-op Pain Management: Regional block*, Tylenol  PO (pre-op)*, Ketamine  IV*, Toradol  IV (intra-op)* and Precedex    Induction: Intravenous  PONV Risk Score and Plan: Ondansetron , Dexamethasone , Midazolam , Treatment may vary due to age or medical condition, Propofol  infusion, TIVA and Scopolamine  patch - Pre-op  Airway Management Planned: LMA  Additional Equipment: None  Intra-op Plan:   Post-operative Plan: Extubation in OR  Informed Consent: I have reviewed the patients History  and Physical, chart, labs and discussed the procedure including the risks, benefits and alternatives for the proposed anesthesia with the patient or authorized representative who has indicated his/her understanding and acceptance.     Dental advisory given  Plan Discussed with: CRNA  Anesthesia Plan Comments: (Chronic pain Allergies to multiple antiemetics)         Anesthesia Quick Evaluation

## 2024-05-23 ENCOUNTER — Encounter (HOSPITAL_BASED_OUTPATIENT_CLINIC_OR_DEPARTMENT_OTHER): Payer: Self-pay | Admitting: Orthopaedic Surgery

## 2024-05-24 ENCOUNTER — Ambulatory Visit (HOSPITAL_BASED_OUTPATIENT_CLINIC_OR_DEPARTMENT_OTHER): Payer: Self-pay | Admitting: Anesthesiology

## 2024-05-24 ENCOUNTER — Encounter (HOSPITAL_BASED_OUTPATIENT_CLINIC_OR_DEPARTMENT_OTHER): Payer: Self-pay | Admitting: Orthopaedic Surgery

## 2024-05-24 ENCOUNTER — Other Ambulatory Visit: Payer: Self-pay

## 2024-05-24 ENCOUNTER — Ambulatory Visit (HOSPITAL_BASED_OUTPATIENT_CLINIC_OR_DEPARTMENT_OTHER)
Admission: RE | Admit: 2024-05-24 | Discharge: 2024-05-24 | Disposition: A | Discharge status: Home / Self Care | Attending: Orthopaedic Surgery | Admitting: Orthopaedic Surgery

## 2024-05-24 ENCOUNTER — Encounter (HOSPITAL_BASED_OUTPATIENT_CLINIC_OR_DEPARTMENT_OTHER)
Admission: RE | Disposition: A | Discharge status: Home / Self Care | Payer: Self-pay | Source: Home / Self Care | Attending: Orthopaedic Surgery

## 2024-05-24 DIAGNOSIS — S83262A Peripheral tear of lateral meniscus, current injury, left knee, initial encounter: Secondary | ICD-10-CM

## 2024-05-24 DIAGNOSIS — E669 Obesity, unspecified: Secondary | ICD-10-CM | POA: Insufficient documentation

## 2024-05-24 DIAGNOSIS — S8992XA Unspecified injury of left lower leg, initial encounter: Secondary | ICD-10-CM

## 2024-05-24 DIAGNOSIS — Z01818 Encounter for other preprocedural examination: Secondary | ICD-10-CM

## 2024-05-24 DIAGNOSIS — M2242 Chondromalacia patellae, left knee: Secondary | ICD-10-CM | POA: Insufficient documentation

## 2024-05-24 DIAGNOSIS — M949 Disorder of cartilage, unspecified: Secondary | ICD-10-CM | POA: Diagnosis present

## 2024-05-24 DIAGNOSIS — Z6839 Body mass index (BMI) 39.0-39.9, adult: Secondary | ICD-10-CM | POA: Diagnosis not present

## 2024-05-24 DIAGNOSIS — G4733 Obstructive sleep apnea (adult) (pediatric): Secondary | ICD-10-CM | POA: Insufficient documentation

## 2024-05-24 DIAGNOSIS — Z87891 Personal history of nicotine dependence: Secondary | ICD-10-CM | POA: Diagnosis not present

## 2024-05-24 DIAGNOSIS — M1990 Unspecified osteoarthritis, unspecified site: Secondary | ICD-10-CM | POA: Diagnosis not present

## 2024-05-24 HISTORY — PX: KNEE ARTHROSCOPY: SHX127

## 2024-05-24 HISTORY — PX: IMPLANTATION, MATRIX-INDUCED AUTOLOGOUS CHONDROCYTES: SHX7593

## 2024-05-24 HISTORY — DX: Unspecified injury of left lower leg, initial encounter: S89.92XA

## 2024-05-24 SURGERY — IMPLANTATION, MATRIX-INDUCED AUTOLOGOUS CHONDROCYTES
Anesthesia: Regional | Site: Knee | Laterality: Left

## 2024-05-24 MED ORDER — LACTATED RINGERS IV SOLN
INTRAVENOUS | Status: DC
Start: 2024-05-24 — End: 2024-05-24

## 2024-05-24 MED ORDER — OXYCODONE HCL 5 MG PO TABS
ORAL_TABLET | ORAL | Status: AC
  Filled 2024-05-24: qty 1

## 2024-05-24 MED ORDER — LIDOCAINE 2% (20 MG/ML) 5 ML SYRINGE
INTRAMUSCULAR | Status: AC
Start: 2024-05-24 — End: 2024-05-24
  Filled 2024-05-24: qty 5

## 2024-05-24 MED ORDER — ONDANSETRON HCL 4 MG/2ML IJ SOLN
INTRAMUSCULAR | Status: AC
  Filled 2024-05-24: qty 2

## 2024-05-24 MED ORDER — HYDROMORPHONE HCL 1 MG/ML IJ SOLN
0.2500 mg | INTRAMUSCULAR | Status: DC | PRN
  Administered 2024-05-24 (×4): 0.5 mg via INTRAVENOUS

## 2024-05-24 MED ORDER — PHENYLEPHRINE 80 MCG/ML (10ML) SYRINGE FOR IV PUSH (FOR BLOOD PRESSURE SUPPORT)
PREFILLED_SYRINGE | INTRAVENOUS | Status: DC | PRN
  Administered 2024-05-24: 160 ug via INTRAVENOUS
  Administered 2024-05-24: 80 ug via INTRAVENOUS

## 2024-05-24 MED ORDER — KETOROLAC TROMETHAMINE 30 MG/ML IJ SOLN
30.0000 mg | Freq: Once | INTRAMUSCULAR | Status: AC
  Administered 2024-05-24: 30 mg via INTRAVENOUS

## 2024-05-24 MED ORDER — KETOROLAC TROMETHAMINE 30 MG/ML IJ SOLN
INTRAMUSCULAR | Status: AC
  Filled 2024-05-24: qty 1

## 2024-05-24 MED ORDER — TISSEEL 10 ML EX KIT
PACK | CUTANEOUS | Status: DC | PRN
  Administered 2024-05-24: 4 mL via TOPICAL

## 2024-05-24 MED ORDER — TRANEXAMIC ACID-NACL 1000-0.7 MG/100ML-% IV SOLN
1000.0000 mg | INTRAVENOUS | Status: AC
  Administered 2024-05-24: 1000 mg via INTRAVENOUS

## 2024-05-24 MED ORDER — HYDROMORPHONE HCL 1 MG/ML IJ SOLN
INTRAMUSCULAR | Status: AC
Start: 2024-05-24 — End: 2024-05-24
  Filled 2024-05-24: qty 1

## 2024-05-24 MED ORDER — VANCOMYCIN HCL 1000 MG IV SOLR
INTRAVENOUS | Status: AC
  Filled 2024-05-24: qty 20

## 2024-05-24 MED ORDER — AMISULPRIDE (ANTIEMETIC) 5 MG/2ML IV SOLN: 10.0000 mg | Freq: Once | INTRAVENOUS | Status: DC | PRN

## 2024-05-24 MED ORDER — LIDOCAINE 2% (20 MG/ML) 5 ML SYRINGE
INTRAMUSCULAR | Status: DC | PRN
  Administered 2024-05-24: 40 mg via INTRAVENOUS

## 2024-05-24 MED ORDER — PROPOFOL 1000 MG/100ML IV EMUL
INTRAVENOUS | Status: AC
  Filled 2024-05-24: qty 100

## 2024-05-24 MED ORDER — DEXAMETHASONE SODIUM PHOSPHATE 10 MG/ML IJ SOLN
INTRAMUSCULAR | Status: AC
Start: 2024-05-24 — End: 2024-05-24
  Filled 2024-05-24: qty 1

## 2024-05-24 MED ORDER — FENTANYL CITRATE (PF) 100 MCG/2ML IJ SOLN
INTRAMUSCULAR | Status: DC | PRN
  Administered 2024-05-24: 50 ug via INTRAVENOUS
  Administered 2024-05-24 (×2): 25 ug via INTRAVENOUS

## 2024-05-24 MED ORDER — ACETAMINOPHEN 500 MG PO TABS: 1000.0000 mg | ORAL_TABLET | Freq: Once | ORAL | Status: AC

## 2024-05-24 MED ORDER — ACETAMINOPHEN 500 MG PO TABS
1000.0000 mg | ORAL_TABLET | Freq: Once | ORAL | Status: AC
  Administered 2024-05-24: 1000 mg via ORAL

## 2024-05-24 MED ORDER — TRANEXAMIC ACID-NACL 1000-0.7 MG/100ML-% IV SOLN
INTRAVENOUS | Status: AC
  Filled 2024-05-24: qty 100

## 2024-05-24 MED ORDER — CEFAZOLIN SODIUM-DEXTROSE 2-4 GM/100ML-% IV SOLN
INTRAVENOUS | Status: AC
  Filled 2024-05-24: qty 100

## 2024-05-24 MED ORDER — GABAPENTIN 300 MG PO CAPS
ORAL_CAPSULE | ORAL | Status: AC
Start: 2024-05-24 — End: 2024-05-24
  Filled 2024-05-24: qty 1

## 2024-05-24 MED ORDER — SCOPOLAMINE 1 MG/3DAYS TD PT72
MEDICATED_PATCH | TRANSDERMAL | Status: AC
  Filled 2024-05-24: qty 1

## 2024-05-24 MED ORDER — MIDAZOLAM HCL 2 MG/2ML IJ SOLN
2.0000 mg | Freq: Once | INTRAMUSCULAR | Status: AC
Start: 2024-05-24 — End: 2024-05-24
  Administered 2024-05-24: 2 mg via INTRAVENOUS

## 2024-05-24 MED ORDER — CEFAZOLIN SODIUM-DEXTROSE 2-4 GM/100ML-% IV SOLN: 2.0000 g | INTRAVENOUS | Status: AC

## 2024-05-24 MED ORDER — MIDAZOLAM HCL 2 MG/2ML IJ SOLN
INTRAMUSCULAR | Status: AC
  Filled 2024-05-24: qty 2

## 2024-05-24 MED ORDER — GABAPENTIN 300 MG PO CAPS: 300.0000 mg | ORAL_CAPSULE | Freq: Once | ORAL | Status: DC

## 2024-05-24 MED ORDER — OXYCODONE HCL 5 MG PO TABS
5.0000 mg | ORAL_TABLET | Freq: Once | ORAL | Status: AC | PRN
  Administered 2024-05-24: 5 mg via ORAL

## 2024-05-24 MED ORDER — PHENYLEPHRINE 80 MCG/ML (10ML) SYRINGE FOR IV PUSH (FOR BLOOD PRESSURE SUPPORT)
PREFILLED_SYRINGE | INTRAVENOUS | Status: AC
Start: 2024-05-24 — End: 2024-05-24
  Filled 2024-05-24: qty 10

## 2024-05-24 MED ORDER — HYDROMORPHONE HCL 1 MG/ML IJ SOLN
INTRAMUSCULAR | Status: AC
Start: 2024-05-24 — End: 2024-05-24
  Filled 2024-05-24: qty 0.5

## 2024-05-24 MED ORDER — 0.9 % SODIUM CHLORIDE (POUR BTL) OPTIME
TOPICAL | Status: DC | PRN
  Administered 2024-05-24: 500 mL

## 2024-05-24 MED ORDER — DEXAMETHASONE SODIUM PHOSPHATE 10 MG/ML IJ SOLN
INTRAMUSCULAR | Status: DC | PRN
  Administered 2024-05-24: 10 mg

## 2024-05-24 MED ORDER — SCOPOLAMINE 1 MG/3DAYS TD PT72
1.0000 | MEDICATED_PATCH | TRANSDERMAL | Status: DC
  Administered 2024-05-24: 1.5 mg via TRANSDERMAL

## 2024-05-24 MED ORDER — TRANEXAMIC ACID-NACL 1000-0.7 MG/100ML-% IV SOLN: 1000.0000 mg | INTRAVENOUS | Status: AC

## 2024-05-24 MED ORDER — MEPERIDINE HCL 25 MG/ML IJ SOLN: 6.2500 mg | INTRAMUSCULAR | Status: DC | PRN

## 2024-05-24 MED ORDER — PROMETHAZINE HCL 6.25 MG/5ML PO SOLN: 6.2500 mg | ORAL | Status: DC | PRN

## 2024-05-24 MED ORDER — PROPOFOL 10 MG/ML IV BOLUS
INTRAVENOUS | Status: DC | PRN
  Administered 2024-05-24: 200 mg via INTRAVENOUS

## 2024-05-24 MED ORDER — FENTANYL CITRATE (PF) 100 MCG/2ML IJ SOLN
INTRAMUSCULAR | Status: AC
Start: 2024-05-24 — End: 2024-05-24
  Filled 2024-05-24: qty 2

## 2024-05-24 MED ORDER — PROPOFOL 10 MG/ML IV BOLUS
INTRAVENOUS | Status: AC
  Filled 2024-05-24: qty 20

## 2024-05-24 MED ORDER — OXYCODONE HCL 5 MG/5ML PO SOLN: 5.0000 mg | Freq: Once | ORAL | Status: AC | PRN

## 2024-05-24 MED ORDER — DEXMEDETOMIDINE HCL IN NACL 80 MCG/20ML IV SOLN
INTRAVENOUS | Status: DC | PRN
  Administered 2024-05-24 (×3): 4 ug via INTRAVENOUS

## 2024-05-24 MED ORDER — ACETAMINOPHEN 500 MG PO TABS
ORAL_TABLET | ORAL | Status: AC
  Filled 2024-05-24: qty 2

## 2024-05-24 MED ORDER — DEXAMETHASONE SODIUM PHOSPHATE 10 MG/ML IJ SOLN
INTRAMUSCULAR | Status: DC | PRN
Start: 2024-05-24 — End: 2024-05-24
  Administered 2024-05-24: 10 mg via INTRAVENOUS

## 2024-05-24 MED ORDER — BUPIVACAINE HCL (PF) 0.25 % IJ SOLN
INTRAMUSCULAR | Status: AC
  Filled 2024-05-24: qty 60

## 2024-05-24 MED ORDER — ONDANSETRON HCL 4 MG/2ML IJ SOLN: 4.0000 mg | Freq: Once | INTRAMUSCULAR | Status: DC | PRN

## 2024-05-24 MED ORDER — FENTANYL CITRATE (PF) 100 MCG/2ML IJ SOLN
100.0000 ug | Freq: Once | INTRAMUSCULAR | Status: AC
  Administered 2024-05-24: 100 ug via INTRAVENOUS

## 2024-05-24 MED ORDER — MIDAZOLAM HCL 5 MG/5ML IJ SOLN
INTRAMUSCULAR | Status: DC | PRN
  Administered 2024-05-24: 2 mg via INTRAVENOUS

## 2024-05-24 MED ORDER — ONDANSETRON HCL 4 MG/2ML IJ SOLN
INTRAMUSCULAR | Status: DC | PRN
  Administered 2024-05-24: 4 mg via INTRAVENOUS

## 2024-05-24 MED ORDER — DEXMEDETOMIDINE HCL IN NACL 80 MCG/20ML IV SOLN
INTRAVENOUS | Status: AC
Start: 2024-05-24 — End: 2024-05-24
  Filled 2024-05-24: qty 20

## 2024-05-24 MED ORDER — HYDROMORPHONE HCL 1 MG/ML IJ SOLN
INTRAMUSCULAR | Status: AC
  Filled 2024-05-24: qty 0.5

## 2024-05-24 MED ORDER — GABAPENTIN 300 MG PO CAPS: 300.0000 mg | ORAL_CAPSULE | Freq: Once | ORAL | Status: AC

## 2024-05-24 MED ORDER — PROPOFOL 500 MG/50ML IV EMUL
INTRAVENOUS | Status: AC
  Filled 2024-05-24: qty 50

## 2024-05-24 MED ORDER — ROPIVACAINE HCL 5 MG/ML IJ SOLN
INTRAMUSCULAR | Status: DC | PRN
Start: 2024-05-24 — End: 2024-05-24
  Administered 2024-05-24: 30 mL via PERINEURAL

## 2024-05-24 MED ORDER — FENTANYL CITRATE (PF) 100 MCG/2ML IJ SOLN
INTRAMUSCULAR | Status: AC
  Filled 2024-05-24: qty 2

## 2024-05-24 MED ORDER — CEFAZOLIN SODIUM-DEXTROSE 2-4 GM/100ML-% IV SOLN
2.0000 g | INTRAVENOUS | Status: AC
  Administered 2024-05-24: 2 g via INTRAVENOUS

## 2024-05-24 MED ORDER — PROPOFOL 500 MG/50ML IV EMUL
INTRAVENOUS | Status: DC | PRN
Start: 2024-05-24 — End: 2024-05-24
  Administered 2024-05-24: 200 ug/kg/min via INTRAVENOUS

## 2024-05-24 SURGICAL SUPPLY — 44 items
BLADE SURG 15 STRL LF DISP TIS (BLADE) ×2 IMPLANT
BNDG COMPR ESMARK 6X3 LF (GAUZE/BANDAGES/DRESSINGS) IMPLANT
BNDG ELASTIC 4INX 5YD STR LF (GAUZE/BANDAGES/DRESSINGS) ×1 IMPLANT
BNDG ELASTIC 6INX 5YD STR LF (GAUZE/BANDAGES/DRESSINGS) ×1 IMPLANT
CHLORAPREP W/TINT 26 (MISCELLANEOUS) ×1 IMPLANT
COOLER ICEMAN CLASSIC (MISCELLANEOUS) ×1 IMPLANT
DRAPE EXTREMITY T 121X128X90 (DISPOSABLE) ×1 IMPLANT
DRAPE IMP U-DRAPE 54X76 (DRAPES) IMPLANT
DRAPE INCISE IOBAN 66X45 STRL (DRAPES) IMPLANT
DRAPE U-SHAPE 47X51 STRL (DRAPES) ×1 IMPLANT
DRAPE-T ARTHROSCOPY W/POUCH (DRAPES) IMPLANT
ELECTRODE REM PT RTRN 9FT ADLT (ELECTROSURGICAL) IMPLANT
EXCALIBUR 3.8MM X 13CM (MISCELLANEOUS) IMPLANT
GAUZE 4X4 16PLY ~~LOC~~+RFID DBL (SPONGE) IMPLANT
GAUZE PAD ABD 8X10 STRL (GAUZE/BANDAGES/DRESSINGS) ×1 IMPLANT
GAUZE SPONGE 4X4 12PLY STRL (GAUZE/BANDAGES/DRESSINGS) ×1 IMPLANT
GAUZE XEROFORM 1X8 LF (GAUZE/BANDAGES/DRESSINGS) ×1 IMPLANT
GLOVE BIO SURGEON STRL SZ 6 (GLOVE) ×1 IMPLANT
GLOVE BIO SURGEON STRL SZ7.5 (GLOVE) ×1 IMPLANT
GLOVE BIOGEL PI IND STRL 6.5 (GLOVE) ×1 IMPLANT
GLOVE BIOGEL PI IND STRL 8 (GLOVE) ×1 IMPLANT
GOWN STRL REUS W/ TWL LRG LVL3 (GOWN DISPOSABLE) ×1 IMPLANT
GOWN STRL REUS W/ TWL XL LVL3 (GOWN DISPOSABLE) ×1 IMPLANT
IMMOBILIZER KNEE 24 THIGH 36 (SOFTGOODS) IMPLANT
PACK ARTHROSCOPY DSU (CUSTOM PROCEDURE TRAY) ×1 IMPLANT
PACK BASIN DAY SURGERY FS (CUSTOM PROCEDURE TRAY) ×1 IMPLANT
PAD COLD SHLDR WRAP-ON (PAD) ×1 IMPLANT
PADDING CAST COTTON 6X4 STRL (CAST SUPPLIES) ×1 IMPLANT
PENCIL SMOKE EVACUATOR (MISCELLANEOUS) IMPLANT
SCAFFOLD CELL AUTOLOGOUS MACI (Tissue) IMPLANT
SLEEVE SCD COMPRESS KNEE MED (STOCKING) ×1 IMPLANT
SPONGE INTESTINAL PEANUT (DISPOSABLE) ×1 IMPLANT
SPONGE T-LAP 18X18 ~~LOC~~+RFID (SPONGE) ×2 IMPLANT
STRIP CLOSURE SKIN 1/2X4 (GAUZE/BANDAGES/DRESSINGS) IMPLANT
SUT CHROMIC 3 0 SH 27 (SUTURE) ×1 IMPLANT
SUT ETHILON 3 0 PS 1 (SUTURE) ×1 IMPLANT
SUT MNCRL AB 4-0 PS2 18 (SUTURE) IMPLANT
SUT VIC AB 0 CT1 27XBRD ANBCTR (SUTURE) ×1 IMPLANT
SUT VIC AB 2-0 CT1 TAPERPNT 27 (SUTURE) ×1 IMPLANT
SYR BULB EAR ULCER 3OZ GRN STR (SYRINGE) IMPLANT
TOWEL GREEN STERILE FF (TOWEL DISPOSABLE) ×2 IMPLANT
TUBE CONNECTING 20X1/4 (TUBING) IMPLANT
TUBING ARTHROSCOPY IRRIG 16FT (MISCELLANEOUS) IMPLANT
YANKAUER SUCT BULB TIP NO VENT (SUCTIONS) IMPLANT

## 2024-05-24 NOTE — Progress Notes (Signed)
Assisted Dr. Finucane with left, adductor canal, ultrasound guided block. Side rails up, monitors on throughout procedure. See vital signs in flow sheet. Tolerated Procedure well. 

## 2024-05-24 NOTE — Interval H&P Note (Signed)
 History and Physical Interval Note:  05/24/2024 6:48 AM  Diane [name]  has presented today for surgery, with the diagnosis of LEFT KNEE CHONDRAL LESION.  The various methods of treatment have been discussed with the patient and family. After consideration of risks, benefits and other options for treatment, the patient has consented to  Procedure(s) with comments: IMPLANTATION, MATRIX-INDUCED AUTOLOGOUS CHONDROCYTES (Left) - LEFT KNEE MATRIX ASSOCIATED CHONDROCYTE IMPLANTATION ARTHROSCOPY, KNEE (Left) as a surgical intervention.  The patient's history has been reviewed, patient examined, no change in status, stable for surgery.  I have reviewed the patient's chart and labs.  Questions were answered to the patient's satisfaction.     [name]

## 2024-05-24 NOTE — Discharge Instructions (Addendum)
 Discharge Instructions    Attending Surgeon: Elspeth Parker, MD Office Phone Number: [phone]   Diagnosis and Procedures:    Surgeries Performed: Left knee diagnostic arthroscopy, MACI cartilage placement  Discharge Plan:    Diet: Resume usual diet. Begin with light or bland foods.  Drink plenty of fluids.  Activity:  Weightbearing as tolerated with brace locked in extension. You are advised to go home directly from the hospital or surgical center. Restrict your activities.  GENERAL INSTRUCTIONS: 1.  Please apply ice to your wound to help with swelling and inflammation. This will improve your comfort and your overall recovery following surgery.     2. Please call Dr. Danetta office at [phone] with questions Monday-Friday during business hours. If no one answers, please leave a message and someone should get back to the patient within 24 hours. For emergencies please call 911 or proceed to the emergency room.   3. Patient to notify surgical team if experiences any of the following: Bowel/Bladder dysfunction, uncontrolled pain, nerve/muscle weakness, incision with increased drainage or redness, nausea/vomiting and Fever greater than 101.0 F.  Be alert for signs of infection including redness, streaking, odor, fever or chills. Be alert for excessive pain or bleeding and notify your surgeon immediately.  WOUND INSTRUCTIONS:   Leave your dressing, cast, or splint in place until your post operative visit.  Keep it clean and dry.  Always keep the incision clean and dry until the staples/sutures are removed. If there is no drainage from the incision you should keep it open to air. If there is drainage from the incision you must keep it covered at all times until the drainage stops  Do not soak in a bath tub, hot tub, pool, lake or other body of water until 21 days after your surgery and your incision is completely dry and healed.  If you have removable sutures (or  staples) they must be removed 10-14 days (unless otherwise instructed) from the day of your surgery.     1)  Elevate the extremity as much as possible.  2)  Keep the dressing clean and dry.  3)  Please call us  if the dressing becomes wet or dirty.  4)  If you are experiencing worsening pain or worsening swelling, please call.     MEDICATIONS: Resume all previous home medications at the previous prescribed dose and frequency unless otherwise noted Start taking the  pain medications on an as-needed basis as prescribed  Please taper down pain medication over the next week following surgery.  Ideally you should not require a refill of any narcotic pain medication.  Take pain medication with food to minimize nausea. In addition to the prescribed pain medication, you may take over-the-counter pain relievers such as Tylenol .  Do NOT take additional tylenol  if your pain medication already has tylenol  in it.  Aspirin  325mg  daily per instructions on bottle. Narcotic policy: Per [facility] clinic policy, our goal is ensure optimal postoperative pain control with a multimodal pain management strategy. For all [facility] patients, our goal is to wean post-operative narcotic medications by 6 weeks post-operatively, and many times sooner. If this is not possible due to utilization of pain medication prior to surgery, your [facility] doctor will support your acute post-operative pain control for the first 6 weeks postoperatively, with a plan to transition you back to your primary pain team following that. Maralee will work to ensure a Therapist, occupational.       FOLLOWUP INSTRUCTIONS: 1. Follow  up at the Physical Therapy Clinic 3-4 days following surgery. This appointment should be scheduled unless other arrangements have been made.The Physical Therapy scheduling number is [phone] if an appointment has not already been arranged.  2. Contact Dr. Danetta office during office hours at [phone] or the  practice after hours line at [phone] for non-emergencies. For medical emergencies call 911.   Discharge Location: Home  No Tylenol  until 12:47, if needed. No Ibuprofen  until 6:15 pm if needed

## 2024-05-24 NOTE — Anesthesia Procedure Notes (Signed)
 Anesthesia Regional Block: Adductor canal block   Pre-Anesthetic Checklist: , timeout performed,  Correct Patient, Correct Site, Correct Laterality,  Correct Procedure, Correct Position, site marked,  Risks and benefits discussed,  Surgical consent,  Pre-op evaluation,  At surgeon's request and post-op pain management  Laterality: Left  Prep: Maximum Sterile Barrier Precautions used, chloraprep       Needles:  Injection technique: Single-shot  Needle Type: Echogenic Stimulator Needle     Needle Length: 9cm  Needle Gauge: 22     Additional Needles:   Procedures:,,,, ultrasound used (permanent image in chart),,    Narrative:  Start time: 05/24/2024 7:00 AM End time: 05/24/2024 7:05 AM Injection made incrementally with aspirations every 5 mL.  Performed by: Personally  Anesthesiologist: Merla Almarie HERO, DO  Additional Notes: Monitors applied. No increased pain on injection. No increased resistance to injection. Injection made in 5cc increments. Good needle visualization. Patient tolerated procedure well.

## 2024-05-24 NOTE — Anesthesia Procedure Notes (Signed)
 Procedure Name: LMA Insertion Date/Time: 05/24/2024 7:36 AM  Performed by: Juline Sanderford D, CRNAPre-anesthesia Checklist: Patient identified, Emergency Drugs available, Suction available and Patient being monitored Patient Re-evaluated:Patient Re-evaluated prior to induction Oxygen  Delivery Method: Circle system utilized Preoxygenation: Pre-oxygenation with 100% oxygen  Induction Type: IV induction Ventilation: Mask ventilation without difficulty LMA: LMA inserted LMA Size: 4.0 Tube type: Oral Number of attempts: 1 Placement Confirmation: positive ETCO2 and breath sounds checked- equal and bilateral Tube secured with: Tape Dental Injury: Teeth and Oropharynx as per pre-operative assessment

## 2024-05-24 NOTE — Transfer of Care (Signed)
 Immediate Anesthesia Transfer of Care Note  Patient: Diane [name]  Procedure(s) Performed: IMPLANTATION, MATRIX-INDUCED AUTOLOGOUS CHONDROCYTES (Left: Knee) ARTHROSCOPY, KNEE (Left: Knee)  Patient Location: PACU  Anesthesia Type:GA combined with regional for post-op pain  Level of Consciousness: awake, alert , oriented, drowsy, and patient cooperative  Airway & Oxygen  Therapy: Patient Spontanous Breathing and Patient connected to face mask oxygen   Post-op Assessment: Report given to RN and Post -op Vital signs reviewed and stable  Post vital signs: Reviewed and stable  Last Vitals:  Vitals Value Taken Time  BP    Temp    Pulse 79 05/24/24 09:04  Resp 15 05/24/24 09:04  SpO2 88 % 05/24/24 09:04  Vitals shown include unfiled device data.  Last Pain:  Vitals:   05/24/24 0644  TempSrc: Temporal  PainSc: 6       Patients Stated Pain Goal: 7 (05/24/24 9355)  Complications: No notable events documented.

## 2024-05-24 NOTE — Anesthesia Postprocedure Evaluation (Signed)
 Anesthesia Post Note  Patient: Diane [name]  Procedure(s) Performed: IMPLANTATION, MATRIX-INDUCED AUTOLOGOUS CHONDROCYTES (Left: Knee) ARTHROSCOPY, KNEE (Left: Knee)     Patient location during evaluation: PACU Anesthesia Type: Regional and General Level of consciousness: awake and alert, oriented and patient cooperative Pain management: pain level controlled Vital Signs Assessment: post-procedure vital signs reviewed and stable Respiratory status: spontaneous breathing, nonlabored ventilation and respiratory function stable Cardiovascular status: blood pressure returned to baseline and stable Postop Assessment: no apparent nausea or vomiting Anesthetic complications: no   No notable events documented.  Last Vitals:  Vitals:   05/24/24 1030 05/24/24 1038  BP: (!) 142/86 (!) 167/99  Pulse: 74 95  Resp: 10 20  Temp:  (!) 36.2 C  SpO2: 97% 95%    Last Pain:  Vitals:   05/24/24 1045  TempSrc:   PainSc: 7                  Diane [name]

## 2024-05-24 NOTE — Brief Op Note (Signed)
   Brief Op Note  Date of Surgery: 05/24/2024  Preoperative Diagnosis: LEFT KNEE CHONDRAL LESION  Postoperative Diagnosis: same  Procedure: Procedure(s): IMPLANTATION, MATRIX-INDUCED AUTOLOGOUS CHONDROCYTES ARTHROSCOPY, KNEE  Implants: Implant Name Type Inv. Item Serial No. Manufacturer Lot No. LRB No. Used Action  [employer] MACI - [id number] Tissue [employer] MACI [id number] VERICEL [id number] Left 1 Implanted    Surgeons: Surgeon(s): Genelle Standing, MD  Anesthesia: General    Estimated Blood Loss: See anesthesia record  Complications: None  Condition to PACU: Stable  Standing LITTIE Genelle, MD 05/24/2024 8:53 AM

## 2024-05-24 NOTE — Op Note (Signed)
 Date of Surgery: 05/24/2024  INDICATIONS: Diane [name] is a 43 y.o.-year-old female with left trochlea as well as patella chondral lesion.  The risk and benefits of the procedure were discussed in detail and documented in the pre-operative evaluation.   PREOPERATIVE DIAGNOSIS: 1.  Left patella 2 x 2 cm lesion full-thickness chondral loss 2. Left trochlea 2 x 2-1/2 cm lesion full-thickness chondral loss 3.  Status post left knee lateral meniscal repair  POSTOPERATIVE DIAGNOSIS: Same.  PROCEDURE: 1. Left patella matrix associated chondrocyte implantation placement 2.  Left trochlear matrix associated chondrocyte implantation placement 3.  Left knee diagnostic arthroscopy  SURGEON: Elspeth LITTIE Parker MD  ASSISTANT: Diane [name], ATC  ANESTHESIA:  general with peripheral nerve block  IV FLUIDS AND URINE: See anesthesia record.  ANTIBIOTICS: Ancef   ESTIMATED BLOOD LOSS: 10 mL.  IMPLANTS:  Implant Name Type Inv. Item Serial No. Manufacturer Lot No. LRB No. Used Action  SCA[employer] MACI - [id number] Tissue [employer] MACI [id number] VERICEL [id number] Left 1 Implanted    DRAINS: None  CULTURES: None  COMPLICATIONS: none  DESCRIPTION OF PROCEDURE:  I identified the patient in the pre-operative holding area.  I marked the operative knee with my initials. I reviewed the risks and benefits of the proposed surgical intervention and the patient wished to proceed.  Anesthesia performed a peripheral nerve block.  Patient was subsequently taken back to the operating room.  The patient was transferred to the operative suite and placed in the supine position with all bony prominences padded.     SCDs were placed on the non-operative lower extremity. Appropriate antibiotics was administered within 1 hour before incision. The operative lower extremity was then prepped and draped in standard fashion. A time out was performed confirming the correct extremity, correct patient and  correct procedure.    A standard anterolateral portal was made with an 11 blade.  The ideal position for the anteromedial portal was established using a spinal needle.  This AM portal was then created under direct visualization with an 11 blade.  A full diagnostic arthroscopy was then performed, as described above, including probing of the chondral and meniscal surfaces.  There was part of the suture that had detached from the lateral meniscal repair.  The meniscus had healed.  As result decision was made to introduce the shaver and debride this suture.  The cartilage as well as the lateral meniscal tissue was well-appearing  I then began with the approach for the matrix associated chondrocyte implantation.  Midline incision was utilized.  An arthrotomy was created by marking out a medial parapatellar approach.  Hemostat was used to protect the underlying cartilage while the arthrotomy was created with electrocautery.  At this point two 0.63 K wires were introduced into the medial patella and used to evert the patella.  The arthroscopy fluid was thoroughly removed.  The lesions were identified in the corresponding ellipsoid shape of the Maci cutter was selected.  This was chosen for both lesions.  This was placed over both lesions to create an outer border.  The curette was then used to remove cartilage down to the level of subchondral bone but not to violate the subchondral plate.  At this time the wound was thoroughly irrigated.  A peripheral and base of fibrin glue was placed about both lesions.  Corresponding sizes were cut out from the implant sheet.  These were then placed over the lesions.  A peripheral layer of fibrin glue was placed.  A peanut was used to compress this down to bone with excellent apposition.  Once the glue was fully sealed the patella was everted and the K wires were removed.  Arthrotomy was closed with 0 Vicryl.    That concluded the case.  Skin was closed with 2-0 Vicryl and 3-0  nylon. Xeroform gauze, gauze, Tegaderm, Iceman and brace were applied.  Instrument, sponge, and needle counts were correct prior to wound closure and at the conclusion of the case.  The patient was taken to the PACU without complication     POSTOPERATIVE PLAN: She will be weightbearing as tolerated with her brace locked in extension.  She will otherwise follow the remainder of the Maci protocol for patella/trochlear lesions.  She will be placed on aspirin  for blood clot prevention  Elspeth LITTIE Parker, MD 8:54 AM

## 2024-05-25 ENCOUNTER — Encounter (HOSPITAL_BASED_OUTPATIENT_CLINIC_OR_DEPARTMENT_OTHER): Payer: Self-pay

## 2024-05-25 ENCOUNTER — Encounter (HOSPITAL_BASED_OUTPATIENT_CLINIC_OR_DEPARTMENT_OTHER): Payer: Self-pay | Admitting: Orthopaedic Surgery

## 2024-05-25 ENCOUNTER — Encounter: Payer: Self-pay | Admitting: Internal Medicine

## 2024-05-25 DIAGNOSIS — E782 Mixed hyperlipidemia: Secondary | ICD-10-CM

## 2024-05-25 DIAGNOSIS — B3731 Acute candidiasis of vulva and vagina: Secondary | ICD-10-CM

## 2024-05-26 ENCOUNTER — Other Ambulatory Visit (HOSPITAL_BASED_OUTPATIENT_CLINIC_OR_DEPARTMENT_OTHER): Payer: Self-pay | Admitting: Orthopaedic Surgery

## 2024-05-26 ENCOUNTER — Other Ambulatory Visit (HOSPITAL_BASED_OUTPATIENT_CLINIC_OR_DEPARTMENT_OTHER): Payer: Self-pay

## 2024-05-26 MED ORDER — FLUCONAZOLE 150 MG PO TABS: 150.0000 mg | ORAL_TABLET | ORAL | 0 refills | Status: DC | PRN

## 2024-05-26 MED ORDER — ATORVASTATIN CALCIUM 40 MG PO TABS: 40.0000 mg | ORAL_TABLET | Freq: Every day | ORAL | 0 refills | Status: DC

## 2024-05-26 MED ORDER — FLUCONAZOLE 100 MG PO TABS
100.0000 mg | ORAL_TABLET | Freq: Every day | ORAL | 2 refills | Status: DC
  Filled 2024-05-26 – 2024-06-08 (×2): qty 30, 30d supply, fill #0

## 2024-05-27 ENCOUNTER — Ambulatory Visit (HOSPITAL_BASED_OUTPATIENT_CLINIC_OR_DEPARTMENT_OTHER): Attending: Orthopaedic Surgery | Admitting: Physical Therapy

## 2024-05-27 ENCOUNTER — Other Ambulatory Visit: Payer: Self-pay

## 2024-05-27 ENCOUNTER — Encounter (HOSPITAL_BASED_OUTPATIENT_CLINIC_OR_DEPARTMENT_OTHER): Payer: Self-pay | Admitting: Physical Therapy

## 2024-05-27 DIAGNOSIS — M25562 Pain in left knee: Secondary | ICD-10-CM | POA: Insufficient documentation

## 2024-05-27 DIAGNOSIS — R2689 Other abnormalities of gait and mobility: Secondary | ICD-10-CM | POA: Insufficient documentation

## 2024-05-27 DIAGNOSIS — R262 Difficulty in walking, not elsewhere classified: Secondary | ICD-10-CM | POA: Diagnosis present

## 2024-05-27 DIAGNOSIS — R600 Localized edema: Secondary | ICD-10-CM | POA: Insufficient documentation

## 2024-05-27 DIAGNOSIS — M6281 Muscle weakness (generalized): Secondary | ICD-10-CM | POA: Diagnosis present

## 2024-05-27 NOTE — Therapy (Signed)
 OUTPATIENT PHYSICAL THERAPY LOWER EXTREMITY EVALUATION   Patient Name: Diane ROSEMAN MRN: [id number] DOB:Oct 31, 1981, 43 y.o., female Today's Date: 05/27/2024  END OF SESSION:  PT End of Session - 05/27/24 1207     Visit Number 1    Date for PT Re-Evaluation 07/12/24    Authorization Type BCBS 20 VL 7 visits completed    PT Start Time 1152   Patient 7 min late   PT Stop Time 1238    PT Time Calculation (min) 46 min    Activity Tolerance Patient tolerated treatment well    Behavior During Therapy WFL for tasks assessed/performed           Past Medical History:  Diagnosis Date   ADD (attention deficit disorder with hyperactivity)    Allergy     Anxiety    Arthritis    Atypical chest pain 07/26/2021   Atypical chest pain     Benign intracranial hypertension 06/16/2008   Qualifier: Diagnosis of   By: Donzell Smolder         Choking    due to food and pills get stuck   Cholelithiasis and acute cholecystitis without obstruction 12/02/2016   Chronic cough 12/27/2013   Depression    Dry mouth 08/12/2017   Dysmenorrhea    Family history of adverse reaction to anesthesia    pt's father has hx. of being hard to wake up post-op   Gastric ulcer 03/16/2015   GERD (gastroesophageal reflux disease)    Headache 07/09/2007   Qualifier: Diagnosis of   By: Tammie MD, Bruce      Replacing diagnoses that were inactivated after the 02/16/23 regulatory import     History of gastric ulcer    History of kidney stones    Hyperlipidemia    IBS (irritable bowel syndrome)    Lower extremity edema 07/26/2021   Lower extremity edema     Migraines    neurologist-  dr c. hagen ([facility] in [place])-- treated with Emgality injection every 30 days   Mild obstructive sleep apnea    per study in epic 03-21-2017 mild osa, recommendation cpap, mouth appliance, wt loss   Nausea and vomiting 12/03/2016   OAB (overactive bladder) 08/12/2017   Osteoarthritis    right ankle    Osteochondral lesion 09/2016   right ankle   Osteomyelitis of knee region [facility])    Peripheral vascular disease (HCC)    NERVE DAMAGE RIGHT LEG AND FOOT DUE TO INJURGY.    Plantar fasciitis of left foot    Pleural effusion 12/15/2013   PONV (postoperative nausea and vomiting)    AND HEADACHES   Pseudotumor cerebri    at back head   RUQ pain    S/P dilatation of esophageal stricture 07/2018   Sinusitis 08/12/2018   RESOLVED WITH ANTIOBIOTIC   SUI (stress urinary incontinence, female)    Wears contact lenses    Wears partial dentures    upper   Past Surgical History:  Procedure Laterality Date   ANKLE ARTHROSCOPY Right 10/22/2016   Procedure: ANKLE ARTHROSCOPY;  Surgeon: Donnice JONELLE Fees, DPM;  Location: [facility];  Service: Podiatry;  Laterality: Right;  GENERAL/REG BLOCK   ANKLE ARTHROSCOPY Right 09-15-2017   @[facility]    BLADDER SURGERY  child   x 2 - as a child to stretch bladder   CESAREAN SECTION  12/24/2009   CESAREAN SECTION  11/20/2011   Procedure: CESAREAN SECTION;  Surgeon: Charlie JINNY Flowers, MD;  Location: [facility];  Service: Gynecology;  Laterality: N/A;   CHOLECYSTECTOMY N/A 12/03/2016   Procedure: LAPAROSCOPIC CHOLECYSTECTOMY WITH INTRAOPERATIVE CHOLANGIOGRAM;  Surgeon: Krystal Spinner, MD;  Location: [facility];  Service: General;  Laterality: N/A;   CHONDROPLASTY Right 01/12/2024   Procedure: RIGHT KNEE ARTHROSCOPY LATERAL FEMORAL CHONDROPLASTY;  Surgeon: Genelle Standing, MD;  Location: [facility];  Service: Orthopedics;  Laterality: Right;   CYSTOSCOPY WITH RETROGRADE PYELOGRAM, URETEROSCOPY AND STENT PLACEMENT Left 08/10/2006   ESOPHAGOGASTRODUODENOSCOPY (EGD) WITH ESOPHAGEAL DILATION  07/2018   HARDWARE REMOVAL Right 07/01/2016   Procedure: HARDWARE REMOVAL;  Surgeon: Glendia Cordella Hutchinson, MD;  Location: [facility];  Service: Orthopedics;  Laterality: Right;   HARVEST BONE GRAFT Right 01/12/2024   Procedure: RIGHT BONE MARROW ASPIRATE FROM ILIAC CREST  DECOMPRESSION AND PLACEMENT RIGHT LATERAL FEMORAL CONDYLE;  Surgeon: Genelle Standing, MD;  Location: [facility];  Service: Orthopedics;  Laterality: Right;   IMPLANTATION, MATRIX-INDUCED AUTOLOGOUS CHONDROCYTES Left 05/24/2024   Procedure: IMPLANTATION, MATRIX-INDUCED AUTOLOGOUS CHONDROCYTES;  Surgeon: Genelle Standing, MD;  Location: [facility];  Service: Orthopedics;  Laterality: Left;  LEFT KNEE MATRIX ASSOCIATED CHONDROCYTE IMPLANTATION   KNEE ARTHROSCOPY Left 05/24/2024   Procedure: ARTHROSCOPY, KNEE;  Surgeon: Genelle Standing, MD;  Location: [facility];  Service: Orthopedics;  Laterality: Left;   KNEE ARTHROSCOPY WITH LATERAL RELEASE Right 12/13/2014   Procedure: KNEE ARTHROSCOPY WITH LATERAL RELEASE;  Surgeon: LELON JONETTA Shari Mickey., MD;  Location: [facility];  Service: Orthopedics;  Laterality: Right;   LAPAROSCOPIC APPENDECTOMY N/A 06/11/2014   Procedure: APPENDECTOMY LAPAROSCOPIC;  Surgeon: Krystal CHRISTELLA Spinner, MD;  Location: [facility];  Service: General;  Laterality: N/A;   ORIF ANKLE FRACTURE Right 01/05/2016   Procedure: OPEN REDUCTION INTERNAL FIXATION (ORIF) ANKLE FRACTURE;  Surgeon: Glendia Cordella Hutchinson, MD;  Location: [facility];  Service: Orthopedics;  Laterality: Right;   OVARIAN CYST SURGERY Right 03/05/2007   AND CHROMOPERTUBATION  VIA LAPAROSCOPY   ROBOTIC ASSISTED LAPAROSCOPIC HYSTERECTOMY AND SALPINGECTOMY Bilateral 08/27/2018   Procedure: XI ROBOTIC ASSISTED LAPAROSCOPIC HYSTERECTOMY AND BILATERAL SALPINGECTOMY;  Surgeon: Gorge Ade, MD;  Location: [facility];  Service: Gynecology;  Laterality: Bilateral;  [facility] for 23hr OBS   SYNOVECTOMY Right 12/13/2014   Procedure: PLICA SYNOVECTOMY;  Surgeon: LELON JONETTA Shari Mickey., MD;  Location: [facility];  Service: Orthopedics;  Laterality: Right;   TONSILLECTOMY AND ADENOIDECTOMY  child   WISDOM TOOTH EXTRACTION     Patient Active Problem List   Diagnosis Date Noted   Chondromalacia of left  patella 05/24/2024   Acute injury of left knee cartilage 05/24/2024   Peripheral tear of lateral meniscus of left knee as current injury 05/24/2024   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 01/12/2024   AVN (avascular necrosis of bone) (HCC) 01/12/2024   Prediabetes 07/14/2023   Weight disorder 07/14/2023   Ankle swelling 07/14/2023   Menopausal syndrome (hot flushes) 07/13/2023   Hyperlipidemia 06/19/2023   Intractable pain 06/17/2023   Chronic constipation 06/17/2023   Other idiopathic scoliosis, lumbar region 06/17/2023   Dysphagia 08/12/2017   Osteochondral lesion 06/03/2017   OSA (obstructive sleep apnea) 03/24/2017   Ankle pain, chronic 01/05/2016   Knee pain, right, chronic 04/12/2015   Chronic migraine without aura 04/01/2013   DEPRESSION, ACUTE 10/02/2008   Opioid use disorder in remission 10/02/2008   back pain, chronic 03/28/2008   Obesity (BMI 30-39.9) 01/25/2008   Anxiety state 11/09/2007    PCP: Jesus Bernardino MATSU, MD   REFERRING PROVIDER: Genelle Standing, MD  REFERRING DIAG:  D16.737J (ICD-10-CM) - Peripheral tear of lateral meniscus of left knee as current injury, initial encounter      THERAPY DIAG:  Left knee pain, unspecified chronicity  Localized edema  Difficulty in walking, not elsewhere classified  Muscle weakness (generalized)  Other abnormalities of gait and mobility  Rationale for Evaluation and Treatment: Rehabilitation  ONSET DATE: 04/05/2024 DOS for meniscal repair  05/24/2024 MACI procedure   PROCEDURE: 1. Left knee lateral meniscal repair with cartilage biopsy   SUBJECTIVE:   PROCEDURE: 1.  Left knee lateral meniscal repair Left MACI repair    SUBJECTIVE:    SUBJECTIVE STATEMENT: The patient had a MACI procedure performed on 05/24/2024. She previously had a left knee meiscal repair on 04/05/2024. She also had a right knee meniscal repair last year. The right knee is doing well. She is having pain at this time on the left side as  well as some syncope. She is using crutches. She reports she has had moderate pain.    PERTINENT HISTORY: Anxiety, arthritis, depression, PVD, LBP PAIN:  Are you having pain? Yes: NPRS scale: 6-7 /10 9/10 at worst  Pain location: left anterior knee surgical site Pain description: aching Aggravating factors: movement; transitions Relieving factors: rest   PRECAUTIONS: Knee   RED FLAGS: None      WEIGHT BEARING RESTRICTIONS: WBAT   FALLS:  Has patient fallen in last 6 months? No   LIVING ENVIRONMENT:  Lives with family- 2 children Staying with mother on couch for now  2 story home Crutches and walker    OCCUPATION:     PLOF: Independent   PATIENT GOALS: return to walking, return to exercise to help with her weight loss   OBJECTIVE:  Note: Objective measures were completed at Evaluation unless otherwise noted.   DIAGNOSTIC FINDINGS:  IMPRESSION: Mild chondromalacia of the lateral facet of the patella. No full-thickness cartilage defect is identified. There is a moderate-sized reactive joint effusion.   Abnormal signal in the anterior horn and anterior body of the lateral meniscus suggesting either an intrameniscal tear or dissecting intrameniscal cyst. Correlation for anterior lateral joint line pain. No displaced meniscal tear is present.   Electronically signed by: Norleen Satchel MD 03/10/2024 05:30 PM EDT RP Workstation: [id number]   PATIENT SURVEYS:  LEFS 20/80   COGNITION: Overall cognitive status: Within functional limits for tasks assessed                         SENSATION: WFL   EDEMA:  Mild into lateral and medial patellar pouches    POSTURE: No Significant postural limitations   PALPATION: No TTP noted around incision sites, clean and dry   LOWER EXTREMITY ROM:   Active ROM Right eval Left eval  Hip flexion      Hip extension      Hip abduction      Hip adduction      Hip internal rotation      Hip external rotation      Knee  flexion  WNL 75  Knee extension  WNL 0  Ankle dorsiflexion      Ankle plantarflexion      Ankle inversion      Ankle eversion       (Blank rows = not tested) *= pain/symptoms   LOWER EXTREMITY MMT:    deferred due to surgical precautions.  Able to perform single SLR without extensor lag     GAIT: Distance walked:  2ft feet  Assistive device utilized: bilat axillary crutches Level of assistance: Modified independence Comments:  step through   TODAY'S TREATMENT:                                                                                                                              DATE:    Bandage change- no s/s of infection (no Tegaderm used-pt is highly allergic);  gauze replaced and new compression bandage applied; pt able to use paper tape  There-ex: gentle PROM into all planes        PATIENT EDUCATION:  Education details: protocol/precautions, diagnosis, prognosis, anatomy, exercise progression, DOMS expectations, muscle firing,  HEP, POC   Person educated: Patient Education method: Explanation, Demonstration, and Handouts Education comprehension: verbalized understanding, returned demonstration, verbal cues required, and tactile cues required   HOME EXERCISE PROGRAM:  Access Code: [email or web address] URL: [email or web address] Date: 04/13/2024 Prepared by: Dale Call   ASSESSMENT:   CLINICAL IMPRESSION: Patient is a 44 y.o. female who was seen today for physical therapy evaluation and treatment for s/p L meniscus repair ( previous) and left MACI procedure. Pt with expected gait, ROM, and strength deviations at this time. Pt with limited quad set.  Pt motion is limited but as would be expected for given procedure.  Pt without signs of infection and understands current precautions. Patient does have syncope she has felt since procedure. She was advised to contact MD if it increased. . Plan to continue with meniscus repair protocol at future sessions. Pt would  benefit from continued skilled therapy in order to reach goals and maximize functional L LE strength and ROM for full return to PLOF.    OBJECTIVE IMPAIRMENTS: Abnormal gait, decreased activity tolerance, decreased balance, decreased endurance, decreased mobility, difficulty walking, decreased ROM, decreased strength, increased edema, increased muscle spasms, impaired flexibility, improper body mechanics, and pain.    ACTIVITY LIMITATIONS: carrying, lifting, bending, standing, squatting, stairs, transfers, hygiene/grooming, locomotion level, and caring for others   PARTICIPATION LIMITATIONS: meal prep, cleaning, laundry, shopping, community activity, and exercise   PERSONAL FACTORS: Fitness, Time since onset of injury/illness/exacerbation, past experiences, and 2+ comorbidites are also affecting patient's functional outcome.    REHAB POTENTIAL: Good   CLINICAL DECISION MAKING: Stable/uncomplicated   EVALUATION COMPLEXITY: Low     GOALS:     SHORT TERM GOALS: Target date: 07/22/2024         Pt will become independent with HEP in order to demonstrate synthesis of PT education.  Baseline: Goal status: INITIAL   2.  Pt will have an at least 18  pt improvement in LEFS measure in order to demonstrate MCID improvement in daily function.   Baseline: Goal status: INITIAL   3.  Patient will demonstrate full L knee ROM for transfers, stairs, ambulation. Baseline:  Goal status: INITIAL       LONG TERM GOALS: Target date:   07/06/2024      Pt  will become independent  with final HEP in order to demonstrate synthesis of PT education. Baseline:  Goal status: INITIAL   2.  Pt will have an at least 27 pt improvement in LEFS measure in order to demonstrate MCID improvement in daily function.   Baseline:  Goal status: INITIAL   3.  Pt will be able to lift/squat/hold >25 lbs in order to demonstrate functional improvement in lumbopelvic strength for return to light and normal ADL.   Baseline:  Goal status: INITIAL   4.  Pt will be able to demonstrate full reciprocal stair management without UE in order to demonstrate functional improvement in LE function for self-care and daily mobility.    Baseline:  Goal status: INITIAL   5.  Pt will be able to demonstrate at least 80% Quad Index in order to demonstrate functional improvement of surgical knee quad strength for advancing to next phase of protocol.    Baseline:  Goal status: INITIAL      PLAN:   PT FREQUENCY: 1-2x/week   PT DURATION: 12 weeks   PLANNED INTERVENTIONS: Therapeutic exercises, Therapeutic activity, Neuromuscular re-education, Balance training, Gait training, Patient/Family education, Joint manipulation, Joint mobilization, Stair training, Orthotic/Fit training, DME instructions, Aquatic Therapy, Dry Needling, Electrical stimulation, Spinal manipulation, Spinal mobilization, Cryotherapy, Moist heat, Compression bandaging, scar mobilization, Splintting, Taping, Traction, Ultrasound, Ionotophoresis 4mg /ml Dexamethasone , and Manual therapy   PLAN FOR NEXT SESSION: f/u with HEP, progress per MACI protocol     Alm JINNY Don, PT 05/27/2024, 2:31 PM

## 2024-05-30 ENCOUNTER — Other Ambulatory Visit: Payer: Self-pay | Admitting: Internal Medicine

## 2024-05-30 ENCOUNTER — Encounter (HOSPITAL_BASED_OUTPATIENT_CLINIC_OR_DEPARTMENT_OTHER): Payer: Self-pay | Admitting: Orthopaedic Surgery

## 2024-05-30 DIAGNOSIS — N951 Menopausal and female climacteric states: Secondary | ICD-10-CM

## 2024-06-02 ENCOUNTER — Encounter (HOSPITAL_BASED_OUTPATIENT_CLINIC_OR_DEPARTMENT_OTHER): Payer: Self-pay

## 2024-06-02 ENCOUNTER — Ambulatory Visit (HOSPITAL_BASED_OUTPATIENT_CLINIC_OR_DEPARTMENT_OTHER)

## 2024-06-02 ENCOUNTER — Encounter (HOSPITAL_BASED_OUTPATIENT_CLINIC_OR_DEPARTMENT_OTHER): Payer: Self-pay | Admitting: Orthopaedic Surgery

## 2024-06-02 DIAGNOSIS — R2689 Other abnormalities of gait and mobility: Secondary | ICD-10-CM

## 2024-06-02 DIAGNOSIS — M25562 Pain in left knee: Secondary | ICD-10-CM | POA: Diagnosis not present

## 2024-06-02 DIAGNOSIS — R600 Localized edema: Secondary | ICD-10-CM

## 2024-06-02 DIAGNOSIS — R262 Difficulty in walking, not elsewhere classified: Secondary | ICD-10-CM

## 2024-06-02 DIAGNOSIS — M6281 Muscle weakness (generalized): Secondary | ICD-10-CM

## 2024-06-02 NOTE — Therapy (Signed)
 OUTPATIENT PHYSICAL THERAPY LOWER EXTREMITY TREATMENT   Patient Name: Diane [name] MRN: [id number] DOB:Oct 03, 1981, 43 y.o., female Today's Date: 06/02/2024  END OF SESSION:  PT End of Session - 06/02/24 1206     Visit Number 2    Number of Visits 21    Date for PT Re-Evaluation 07/12/24    Authorization Type [employer] 20 VL 7 visits completed    PT Start Time 1155   pt arrived late   PT Stop Time 1229    PT Time Calculation (min) 34 min    Activity Tolerance Patient tolerated treatment well    Behavior During Therapy WFL for tasks assessed/performed            Past Medical History:  Diagnosis Date   ADD (attention deficit disorder with hyperactivity)    Allergy     Anxiety    Arthritis    Atypical chest pain 07/26/2021   Atypical chest pain     Benign intracranial hypertension 06/16/2008   Qualifier: Diagnosis of   By: Donzell Smolder         Choking    due to food and pills get stuck   Cholelithiasis and acute cholecystitis without obstruction 12/02/2016   Chronic cough 12/27/2013   Depression    Dry mouth 08/12/2017   Dysmenorrhea    Family history of adverse reaction to anesthesia    pt's father has hx. of being hard to wake up post-op   Gastric ulcer 03/16/2015   GERD (gastroesophageal reflux disease)    Headache 07/09/2007   Qualifier: Diagnosis of   By: Tammie MD, Bruce      Replacing diagnoses that were inactivated after the 02/16/23 regulatory import     History of gastric ulcer    History of kidney stones    Hyperlipidemia    IBS (irritable bowel syndrome)    Lower extremity edema 07/26/2021   Lower extremity edema     Migraines    neurologist-  dr c. hagen ([facility] in [place])-- treated with Emgality injection every 30 days   Mild obstructive sleep apnea    per study in epic 03-21-2017 mild osa, recommendation cpap, mouth appliance, wt loss   Nausea and vomiting 12/03/2016   OAB (overactive bladder) 08/12/2017   Osteoarthritis     right ankle   Osteochondral lesion 09/2016   right ankle   Osteomyelitis of knee region [facility])    Peripheral vascular disease (HCC)    NERVE DAMAGE RIGHT LEG AND FOOT DUE TO INJURGY.    Plantar fasciitis of left foot    Pleural effusion 12/15/2013   PONV (postoperative nausea and vomiting)    AND HEADACHES   Pseudotumor cerebri    at back head   RUQ pain    S/P dilatation of esophageal stricture 07/2018   Sinusitis 08/12/2018   RESOLVED WITH ANTIOBIOTIC   SUI (stress urinary incontinence, female)    Wears contact lenses    Wears partial dentures    upper   Past Surgical History:  Procedure Laterality Date   ANKLE ARTHROSCOPY Right 10/22/2016   Procedure: ANKLE ARTHROSCOPY;  Surgeon: Donnice JONELLE Fees, DPM;  Location: [facility];  Service: Podiatry;  Laterality: Right;  GENERAL/REG BLOCK   ANKLE ARTHROSCOPY Right 09-15-2017   @[facility]    BLADDER SURGERY  child   x 2 - as a child to stretch bladder   CESAREAN SECTION  12/24/2009   CESAREAN SECTION  11/20/2011   Procedure: CESAREAN SECTION;  Surgeon: Charlie  JINNY Flowers, MD;  Location: [facility];  Service: Gynecology;  Laterality: N/A;   CHOLECYSTECTOMY N/A 12/03/2016   Procedure: LAPAROSCOPIC CHOLECYSTECTOMY WITH INTRAOPERATIVE CHOLANGIOGRAM;  Surgeon: Krystal Spinner, MD;  Location: [facility];  Service: General;  Laterality: N/A;   CHONDROPLASTY Right 01/12/2024   Procedure: RIGHT KNEE ARTHROSCOPY LATERAL FEMORAL CHONDROPLASTY;  Surgeon: Genelle Standing, MD;  Location: [facility];  Service: Orthopedics;  Laterality: Right;   CYSTOSCOPY WITH RETROGRADE PYELOGRAM, URETEROSCOPY AND STENT PLACEMENT Left 08/10/2006   ESOPHAGOGASTRODUODENOSCOPY (EGD) WITH ESOPHAGEAL DILATION  07/2018   HARDWARE REMOVAL Right 07/01/2016   Procedure: HARDWARE REMOVAL;  Surgeon: Glendia Cordella Hutchinson, MD;  Location: [facility];  Service: Orthopedics;  Laterality: Right;   HARVEST BONE GRAFT Right 01/12/2024   Procedure: RIGHT BONE MARROW ASPIRATE FROM ILIAC  CREST DECOMPRESSION AND PLACEMENT RIGHT LATERAL FEMORAL CONDYLE;  Surgeon: Genelle Standing, MD;  Location: [facility];  Service: Orthopedics;  Laterality: Right;   IMPLANTATION, MATRIX-INDUCED AUTOLOGOUS CHONDROCYTES Left 05/24/2024   Procedure: IMPLANTATION, MATRIX-INDUCED AUTOLOGOUS CHONDROCYTES;  Surgeon: Genelle Standing, MD;  Location: [facility];  Service: Orthopedics;  Laterality: Left;  LEFT KNEE MATRIX ASSOCIATED CHONDROCYTE IMPLANTATION   KNEE ARTHROSCOPY Left 05/24/2024   Procedure: ARTHROSCOPY, KNEE;  Surgeon: Genelle Standing, MD;  Location: [facility];  Service: Orthopedics;  Laterality: Left;   KNEE ARTHROSCOPY WITH LATERAL RELEASE Right 12/13/2014   Procedure: KNEE ARTHROSCOPY WITH LATERAL RELEASE;  Surgeon: LELON JONETTA Shari Mickey., MD;  Location: [facility];  Service: Orthopedics;  Laterality: Right;   LAPAROSCOPIC APPENDECTOMY N/A 06/11/2014   Procedure: APPENDECTOMY LAPAROSCOPIC;  Surgeon: Krystal CHRISTELLA Spinner, MD;  Location: [facility];  Service: General;  Laterality: N/A;   ORIF ANKLE FRACTURE Right 01/05/2016   Procedure: OPEN REDUCTION INTERNAL FIXATION (ORIF) ANKLE FRACTURE;  Surgeon: Glendia Cordella Hutchinson, MD;  Location: [facility];  Service: Orthopedics;  Laterality: Right;   OVARIAN CYST SURGERY Right 03/05/2007   AND CHROMOPERTUBATION  VIA LAPAROSCOPY   ROBOTIC ASSISTED LAPAROSCOPIC HYSTERECTOMY AND SALPINGECTOMY Bilateral 08/27/2018   Procedure: XI ROBOTIC ASSISTED LAPAROSCOPIC HYSTERECTOMY AND BILATERAL SALPINGECTOMY;  Surgeon: Flowers Ade, MD;  Location: [facility];  Service: Gynecology;  Laterality: Bilateral;  [facility] for 23hr OBS   SYNOVECTOMY Right 12/13/2014   Procedure: PLICA SYNOVECTOMY;  Surgeon: LELON JONETTA Shari Mickey., MD;  Location:  [facility];  Service: Orthopedics;  Laterality: Right;   TONSILLECTOMY AND ADENOIDECTOMY  child   WISDOM TOOTH EXTRACTION     Patient Active Problem List   Diagnosis Date Noted   Chondromalacia of  left patella 05/24/2024   Acute injury of left knee cartilage 05/24/2024   Peripheral tear of lateral meniscus of left knee as current injury 05/24/2024   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 01/12/2024   AVN (avascular necrosis of bone) (HCC) 01/12/2024   Prediabetes 07/14/2023   Weight disorder 07/14/2023   Ankle swelling 07/14/2023   Menopausal syndrome (hot flushes) 07/13/2023   Hyperlipidemia 06/19/2023   Intractable pain 06/17/2023   Chronic constipation 06/17/2023   Other idiopathic scoliosis, lumbar region 06/17/2023   Dysphagia 08/12/2017   Osteochondral lesion 06/03/2017   OSA (obstructive sleep apnea) 03/24/2017   Ankle pain, chronic 01/05/2016   Knee pain, right, chronic 04/12/2015   Chronic migraine without aura 04/01/2013   DEPRESSION, ACUTE 10/02/2008   Opioid use disorder in remission 10/02/2008   back pain, chronic 03/28/2008   Obesity (BMI 30-39.9) 01/25/2008   Anxiety state 11/09/2007    PCP: Jesus Bernardino MATSU,  MD   REFERRING PROVIDER: Genelle Standing, MD   REFERRING DIAG:  [phone] (ICD-10-CM) - Peripheral tear of lateral meniscus of left knee as current injury, initial encounter      THERAPY DIAG:  Left knee pain, unspecified chronicity  Localized edema  Difficulty in walking, not elsewhere classified  Muscle weakness (generalized)  Other abnormalities of gait and mobility  Rationale for Evaluation and Treatment: Rehabilitation  ONSET DATE: 04/05/2024 DOS for meniscal repair  05/24/2024 MACI procedure   PROCEDURE: 1. Left knee lateral meniscal repair with cartilage biopsy   SUBJECTIVE:   PROCEDURE: 1.  Left knee lateral meniscal repair Left MACI repair    SUBJECTIVE:    SUBJECTIVE STATEMENT:  Pt reports the paper tape has removed some skin when she removed it the next day after it started itching. Areas look raw, but no signs of infections. Has been placing gauze with ACE wrap over it since. Arrived with brace donned and bilateral  crutches. 7/10 pain level at entry.   Re-eval: The patient had a MACI procedure performed on 05/24/2024. She previously had a left knee meiscal repair on 04/05/2024. She also had a right knee meniscal repair last year. The right knee is doing well. She is having pain at this time on the left side as well as some syncope. She is using crutches. She reports she has had moderate pain.    PERTINENT HISTORY: Anxiety, arthritis, depression, PVD, LBP PAIN:  Are you having pain? Yes: NPRS scale: 7 /10 9/10 at worst  Pain location: left anterior knee surgical site Pain description: aching Aggravating factors: movement; transitions Relieving factors: rest   PRECAUTIONS: Knee   RED FLAGS: None      WEIGHT BEARING RESTRICTIONS: WBAT   FALLS:  Has patient fallen in last 6 months? No   LIVING ENVIRONMENT:  Lives with family- 2 children Staying with mother on couch for now  2 story home Crutches and walker    OCCUPATION:     PLOF: Independent   PATIENT GOALS: return to walking, return to exercise to help with her weight loss   OBJECTIVE:  Note: Objective measures were completed at Evaluation unless otherwise noted.   DIAGNOSTIC FINDINGS:  IMPRESSION: Mild chondromalacia of the lateral facet of the patella. No full-thickness cartilage defect is identified. There is a moderate-sized reactive joint effusion.   Abnormal signal in the anterior horn and anterior body of the lateral meniscus suggesting either an intrameniscal tear or dissecting intrameniscal cyst. Correlation for anterior lateral joint line pain. No displaced meniscal tear is present.   Electronically signed by: Norleen Satchel MD 03/10/2024 05:30 PM EDT RP Workstation: [id number]   PATIENT SURVEYS:  LEFS 20/80   COGNITION: Overall cognitive status: Within functional limits for tasks assessed                         SENSATION: WFL   EDEMA:  Mild into lateral and medial patellar pouches    POSTURE: No  Significant postural limitations   PALPATION: No TTP noted around incision sites, clean and dry   LOWER EXTREMITY ROM:   Active ROM Right eval Left eval  Hip flexion      Hip extension      Hip abduction      Hip adduction      Hip internal rotation      Hip external rotation      Knee flexion  WNL 75  Knee extension  WNL 0  Ankle  dorsiflexion      Ankle plantarflexion      Ankle inversion      Ankle eversion       (Blank rows = not tested) *= pain/symptoms   LOWER EXTREMITY MMT:    deferred due to surgical precautions.  Able to perform single SLR without extensor lag     GAIT: Distance walked: 59ft feet  Assistive device utilized: bilat axillary crutches Level of assistance: Modified independence Comments:  step through   TODAY'S TREATMENT:                                                                                                                              DATE: 7/17 -bandage change (gauze and new ACE wrap) -quad sets 5 2x10 -SLR with clinician assist x5, x4 - bridge with LES on physioball 3x10 -s/l hip abduction 3x10 -gait in hall with brace and bil crutches x111ft    Previous: Bandage change- no s/s of infection (no Tegaderm used-pt is highly allergic);  gauze replaced and new compression bandage applied; pt able to use paper tape  There-ex: gentle PROM into all planes        PATIENT EDUCATION:  Education details: protocol/precautions, diagnosis, prognosis, anatomy, exercise progression, DOMS expectations, muscle firing,  HEP, POC   Person educated: Patient Education method: Explanation, Demonstration, and Handouts Education comprehension: verbalized understanding, returned demonstration, verbal cues required, and tactile cues required   HOME EXERCISE PROGRAM:  Access Code: [email or web address] URL: [email or web address] Date: 04/13/2024 Prepared by: Dale Call   ASSESSMENT:   CLINICAL IMPRESSION: Session limited due to late  arrival. Changed dressing and observed incision/recent wounds from paper tape. Appears to be healing well. Applied gauze to area without tape and applied ACE wrap to hold in place. Advised she can remove coverings when staying stationary for prolonged periods. Worked on gait training with crutches which she tolerates well. Will continue to progress as tolerated.    Patient is a 43 y.o. female who was seen today for physical therapy evaluation and treatment for s/p L meniscus repair ( previous) and left MACI procedure. Pt with expected gait, ROM, and strength deviations at this time. Pt with limited quad set.  Pt motion is limited but as would be expected for given procedure.  Pt without signs of infection and understands current precautions. Patient does have syncope she has felt since procedure. She was advised to contact MD if it increased. . Plan to continue with meniscus repair protocol at future sessions. Pt would benefit from continued skilled therapy in order to reach goals and maximize functional L LE strength and ROM for full return to PLOF.    OBJECTIVE IMPAIRMENTS: Abnormal gait, decreased activity tolerance, decreased balance, decreased endurance, decreased mobility, difficulty walking, decreased ROM, decreased strength, increased edema, increased muscle spasms, impaired flexibility, improper body mechanics, and pain.    ACTIVITY LIMITATIONS: carrying, lifting, bending, standing, squatting, stairs, transfers, hygiene/grooming, locomotion level, and caring for others  PARTICIPATION LIMITATIONS: meal prep, cleaning, laundry, shopping, community activity, and exercise   PERSONAL FACTORS: Fitness, Time since onset of injury/illness/exacerbation, past experiences, and 2+ comorbidites are also affecting patient's functional outcome.    REHAB POTENTIAL: Good   CLINICAL DECISION MAKING: Stable/uncomplicated   EVALUATION COMPLEXITY: Low     GOALS:     SHORT TERM GOALS: Target date:  07/22/2024         Pt will become independent with HEP in order to demonstrate synthesis of PT education.  Baseline: Goal status: INITIAL   2.  Pt will have an at least 18  pt improvement in LEFS measure in order to demonstrate MCID improvement in daily function.   Baseline: Goal status: INITIAL   3.  Patient will demonstrate full L knee ROM for transfers, stairs, ambulation. Baseline:  Goal status: INITIAL       LONG TERM GOALS: Target date:   07/06/2024      Pt  will become independent with final HEP in order to demonstrate synthesis of PT education. Baseline:  Goal status: INITIAL   2.  Pt will have an at least 27 pt improvement in LEFS measure in order to demonstrate MCID improvement in daily function.   Baseline:  Goal status: INITIAL   3.  Pt will be able to lift/squat/hold >25 lbs in order to demonstrate functional improvement in lumbopelvic strength for return to light and normal ADL.  Baseline:  Goal status: INITIAL   4.  Pt will be able to demonstrate full reciprocal stair management without UE in order to demonstrate functional improvement in LE function for self-care and daily mobility.    Baseline:  Goal status: INITIAL   5.  Pt will be able to demonstrate at least 80% Quad Index in order to demonstrate functional improvement of surgical knee quad strength for advancing to next phase of protocol.    Baseline:  Goal status: INITIAL      PLAN:   PT FREQUENCY: 1-2x/week   PT DURATION: 12 weeks   PLANNED INTERVENTIONS: Therapeutic exercises, Therapeutic activity, Neuromuscular re-education, Balance training, Gait training, Patient/Family education, Joint manipulation, Joint mobilization, Stair training, Orthotic/Fit training, DME instructions, Aquatic Therapy, Dry Needling, Electrical stimulation, Spinal manipulation, Spinal mobilization, Cryotherapy, Moist heat, Compression bandaging, scar mobilization, Splintting, Taping, Traction, Ultrasound,  Ionotophoresis 4mg /ml Dexamethasone , and Manual therapy   PLAN FOR NEXT SESSION: f/u with HEP, progress per MACI protocol     Asberry BRAVO Miciah Shealy, PTA 06/02/2024, 2:04 PM

## 2024-06-03 ENCOUNTER — Other Ambulatory Visit: Payer: Self-pay | Admitting: Internal Medicine

## 2024-06-03 ENCOUNTER — Encounter: Admitting: Neurology

## 2024-06-03 DIAGNOSIS — E782 Mixed hyperlipidemia: Secondary | ICD-10-CM

## 2024-06-03 DIAGNOSIS — N951 Menopausal and female climacteric states: Secondary | ICD-10-CM

## 2024-06-04 NOTE — Therapy (Deleted)
 OUTPATIENT PHYSICAL THERAPY LOWER EXTREMITY TREATMENT   Patient Name: Diane [name] MRN: [id number] DOB:1981/09/06, 43 y.o., female Today's Date: 06/04/2024  END OF SESSION:      Past Medical History:  Diagnosis Date   ADD (attention deficit disorder with hyperactivity)    Allergy     Anxiety    Arthritis    Atypical chest pain 07/26/2021   Atypical chest pain     Benign intracranial hypertension 06/16/2008   Qualifier: Diagnosis of   By: Donzell Smolder         Choking    due to food and pills get stuck   Cholelithiasis and acute cholecystitis without obstruction 12/02/2016   Chronic cough 12/27/2013   Depression    Dry mouth 08/12/2017   Dysmenorrhea    Family history of adverse reaction to anesthesia    pt's father has hx. of being hard to wake up post-op   Gastric ulcer 03/16/2015   GERD (gastroesophageal reflux disease)    Headache 07/09/2007   Qualifier: Diagnosis of   By: Tammie MD, Bruce      Replacing diagnoses that were inactivated after the 02/16/23 regulatory import     History of gastric ulcer    History of kidney stones    Hyperlipidemia    IBS (irritable bowel syndrome)    Lower extremity edema 07/26/2021   Lower extremity edema     Migraines    neurologist-  dr c. hagen ([facility] in [place])-- treated with Emgality injection every 30 days   Mild obstructive sleep apnea    per study in epic 03-21-2017 mild osa, recommendation cpap, mouth appliance, wt loss   Nausea and vomiting 12/03/2016   OAB (overactive bladder) 08/12/2017   Osteoarthritis    right ankle   Osteochondral lesion 09/2016   right ankle   Osteomyelitis of knee region [facility])    Peripheral vascular disease (HCC)    NERVE DAMAGE RIGHT LEG AND FOOT DUE TO INJURGY.    Plantar fasciitis of left foot    Pleural effusion 12/15/2013   PONV (postoperative nausea and vomiting)    AND HEADACHES   Pseudotumor cerebri    at back head   RUQ pain    S/P dilatation of  esophageal stricture 07/2018   Sinusitis 08/12/2018   RESOLVED WITH ANTIOBIOTIC   SUI (stress urinary incontinence, female)    Wears contact lenses    Wears partial dentures    upper   Past Surgical History:  Procedure Laterality Date   ANKLE ARTHROSCOPY Right 10/22/2016   Procedure: ANKLE ARTHROSCOPY;  Surgeon: Donnice JONELLE Fees, DPM;  Location: [facility];  Service: Podiatry;  Laterality: Right;  GENERAL/REG BLOCK   ANKLE ARTHROSCOPY Right 09-15-2017   @[facility]    BLADDER SURGERY  child   x 2 - as a child to stretch bladder   CESAREAN SECTION  12/24/2009   CESAREAN SECTION  11/20/2011   Procedure: CESAREAN SECTION;  Surgeon: Charlie JINNY Flowers, MD;  Location: [facility];  Service: Gynecology;  Laterality: N/A;   CHOLECYSTECTOMY N/A 12/03/2016   Procedure: LAPAROSCOPIC CHOLECYSTECTOMY WITH INTRAOPERATIVE CHOLANGIOGRAM;  Surgeon: Krystal Spinner, MD;  Location: [facility];  Service: General;  Laterality: N/A;   CHONDROPLASTY Right 01/12/2024   Procedure: RIGHT KNEE ARTHROSCOPY LATERAL FEMORAL CHONDROPLASTY;  Surgeon: Genelle Standing, MD;  Location: [facility];  Service: Orthopedics;  Laterality: Right;   CYSTOSCOPY WITH RETROGRADE PYELOGRAM, URETEROSCOPY AND STENT PLACEMENT Left 08/10/2006   ESOPHAGOGASTRODUODENOSCOPY (EGD) WITH ESOPHAGEAL DILATION  07/2018  HARDWARE REMOVAL Right 07/01/2016   Procedure: HARDWARE REMOVAL;  Surgeon: Glendia Cordella Hutchinson, MD;  Location: [facility];  Service: Orthopedics;  Laterality: Right;   HARVEST BONE GRAFT Right 01/12/2024   Procedure: RIGHT BONE MARROW ASPIRATE FROM ILIAC CREST DECOMPRESSION AND PLACEMENT RIGHT LATERAL FEMORAL CONDYLE;  Surgeon: Genelle Standing, MD;  Location: [facility];  Service: Orthopedics;  Laterality: Right;   IMPLANTATION, MATRIX-INDUCED AUTOLOGOUS CHONDROCYTES Left 05/24/2024   Procedure: IMPLANTATION, MATRIX-INDUCED AUTOLOGOUS CHONDROCYTES;  Surgeon: Genelle Standing, MD;  Location: [facility];   Service: Orthopedics;  Laterality: Left;  LEFT KNEE MATRIX ASSOCIATED CHONDROCYTE IMPLANTATION   KNEE ARTHROSCOPY Left 05/24/2024   Procedure: ARTHROSCOPY, KNEE;  Surgeon: Genelle Standing, MD;  Location: [facility];  Service: Orthopedics;  Laterality: Left;   KNEE ARTHROSCOPY WITH LATERAL RELEASE Right 12/13/2014   Procedure: KNEE ARTHROSCOPY WITH LATERAL RELEASE;  Surgeon: LELON JONETTA Shari Mickey., MD;  Location: [facility];  Service: Orthopedics;  Laterality: Right;   LAPAROSCOPIC APPENDECTOMY N/A 06/11/2014   Procedure: APPENDECTOMY LAPAROSCOPIC;  Surgeon: Krystal CHRISTELLA Spinner, MD;  Location: [facility];  Service: General;  Laterality: N/A;   ORIF ANKLE FRACTURE Right 01/05/2016   Procedure: OPEN REDUCTION INTERNAL FIXATION (ORIF) ANKLE FRACTURE;  Surgeon: Glendia Cordella Hutchinson, MD;  Location: [facility];  Service: Orthopedics;  Laterality: Right;   OVARIAN CYST SURGERY Right 03/05/2007   AND CHROMOPERTUBATION  VIA LAPAROSCOPY   ROBOTIC ASSISTED LAPAROSCOPIC HYSTERECTOMY AND SALPINGECTOMY Bilateral 08/27/2018   Procedure: XI ROBOTIC ASSISTED LAPAROSCOPIC HYSTERECTOMY AND BILATERAL SALPINGECTOMY;  Surgeon: Gorge Ade, MD;  Location: [facility];  Service: Gynecology;  Laterality: Bilateral;  [facility] for 23hr OBS   SYNOVECTOMY Right 12/13/2014   Procedure: PLICA SYNOVECTOMY;  Surgeon: LELON JONETTA Shari Mickey., MD;  Location: [facility];  Service: Orthopedics;  Laterality: Right;   TONSILLECTOMY AND ADENOIDECTOMY  child   WISDOM TOOTH EXTRACTION     Patient Active Problem List   Diagnosis Date Noted   Chondromalacia of left patella 05/24/2024   Acute injury of left knee cartilage 05/24/2024   Peripheral tear of lateral meniscus of left knee as current injury 05/24/2024   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 01/12/2024   AVN (avascular necrosis of bone) (HCC) 01/12/2024   Prediabetes 07/14/2023   Weight disorder 07/14/2023   Ankle swelling 07/14/2023   Menopausal syndrome (hot flushes)  07/13/2023   Hyperlipidemia 06/19/2023   Intractable pain 06/17/2023   Chronic constipation 06/17/2023   Other idiopathic scoliosis, lumbar region 06/17/2023   Dysphagia 08/12/2017   Osteochondral lesion 06/03/2017   OSA (obstructive sleep apnea) 03/24/2017   Ankle pain, chronic 01/05/2016   Knee pain, right, chronic 04/12/2015   Chronic migraine without aura 04/01/2013   DEPRESSION, ACUTE 10/02/2008   Opioid use disorder in remission 10/02/2008   back pain, chronic 03/28/2008   Obesity (BMI 30-39.9) 01/25/2008   Anxiety state 11/09/2007    PCP: Jesus Bernardino MATSU, MD   REFERRING PROVIDER: Genelle Standing, MD   REFERRING DIAG:  [phone] (ICD-10-CM) - Peripheral tear of lateral meniscus of left knee as current injury, initial encounter      THERAPY DIAG:  No diagnosis found.  Rationale for Evaluation and Treatment: Rehabilitation  ONSET DATE: 04/05/2024 DOS for meniscal repair  05/24/2024 MACI procedure   PROCEDURE: 1. Left knee lateral meniscal repair with cartilage biopsy   SUBJECTIVE:   PROCEDURE: 1.  Left knee lateral meniscal repair Left MACI repair    SUBJECTIVE:    SUBJECTIVE  STATEMENT:  Pt reports the paper tape has removed some skin when she removed it the next day after it started itching. Areas look raw, but no signs of infections. Has been placing gauze with ACE wrap over it since. Arrived with brace donned and bilateral crutches. 7/10 pain level at entry.   Re-eval: The patient had a MACI procedure performed on 05/24/2024. She previously had a left knee meiscal repair on 04/05/2024. She also had a right knee meniscal repair last year. The right knee is doing well. She is having pain at this time on the left side as well as some syncope. She is using crutches. She reports she has had moderate pain.    PERTINENT HISTORY: Anxiety, arthritis, depression, PVD, LBP PAIN:  Are you having pain? Yes: NPRS scale: 7 /10 9/10 at worst  Pain location: left anterior knee  surgical site Pain description: aching Aggravating factors: movement; transitions Relieving factors: rest   PRECAUTIONS: Knee   RED FLAGS: None      WEIGHT BEARING RESTRICTIONS: WBAT   FALLS:  Has patient fallen in last 6 months? No   LIVING ENVIRONMENT:  Lives with family- 2 children Staying with mother on couch for now  2 story home Crutches and walker    OCCUPATION:     PLOF: Independent   PATIENT GOALS: return to walking, return to exercise to help with her weight loss   OBJECTIVE:  Note: Objective measures were completed at Evaluation unless otherwise noted.   DIAGNOSTIC FINDINGS:  IMPRESSION: Mild chondromalacia of the lateral facet of the patella. No full-thickness cartilage defect is identified. There is a moderate-sized reactive joint effusion.   Abnormal signal in the anterior horn and anterior body of the lateral meniscus suggesting either an intrameniscal tear or dissecting intrameniscal cyst. Correlation for anterior lateral joint line pain. No displaced meniscal tear is present.   Electronically signed by: Norleen Satchel MD 03/10/2024 05:30 PM EDT RP Workstation: [id number]   PATIENT SURVEYS:  LEFS 20/80   COGNITION: Overall cognitive status: Within functional limits for tasks assessed                         SENSATION: WFL   EDEMA:  Mild into lateral and medial patellar pouches    POSTURE: No Significant postural limitations   PALPATION: No TTP noted around incision sites, clean and dry   LOWER EXTREMITY ROM:   Active ROM Right eval Left eval  Hip flexion      Hip extension      Hip abduction      Hip adduction      Hip internal rotation      Hip external rotation      Knee flexion  WNL 75  Knee extension  WNL 0  Ankle dorsiflexion      Ankle plantarflexion      Ankle inversion      Ankle eversion       (Blank rows = not tested) *= pain/symptoms   LOWER EXTREMITY MMT:    deferred due to surgical precautions.  Able  to perform single SLR without extensor lag     GAIT: Distance walked: 32ft feet  Assistive device utilized: bilat axillary crutches Level of assistance: Modified independence Comments:  step through   TODAY'S TREATMENT:  DATE: 7/17 -bandage change (gauze and new ACE wrap) -quad sets 5 2x10 -SLR with clinician assist x5, x4 - bridge with LES on physioball 3x10 -s/l hip abduction 3x10 -gait in hall with brace and bil crutches x170ft    Previous: Bandage change- no s/s of infection (no Tegaderm used-pt is highly allergic);  gauze replaced and new compression bandage applied; pt able to use paper tape  There-ex: gentle PROM into all planes        PATIENT EDUCATION:  Education details: protocol/precautions, diagnosis, prognosis, anatomy, exercise progression, DOMS expectations, muscle firing,  HEP, POC   Person educated: Patient Education method: Explanation, Demonstration, and Handouts Education comprehension: verbalized understanding, returned demonstration, verbal cues required, and tactile cues required   HOME EXERCISE PROGRAM:  Access Code: [email or web address] URL: [email or web address] Date: 04/13/2024 Prepared by: Dale Call   ASSESSMENT:   CLINICAL IMPRESSION: Session limited due to late arrival. Changed dressing and observed incision/recent wounds from paper tape. Appears to be healing well. Applied gauze to area without tape and applied ACE wrap to hold in place. Advised she can remove coverings when staying stationary for prolonged periods. Worked on gait training with crutches which she tolerates well. Will continue to progress as tolerated.    Patient is a 43 y.o. female who was seen today for physical therapy evaluation and treatment for s/p L meniscus repair ( previous) and left MACI procedure. Pt with expected gait, ROM, and  strength deviations at this time. Pt with limited quad set.  Pt motion is limited but as would be expected for given procedure.  Pt without signs of infection and understands current precautions. Patient does have syncope she has felt since procedure. She was advised to contact MD if it increased. . Plan to continue with meniscus repair protocol at future sessions. Pt would benefit from continued skilled therapy in order to reach goals and maximize functional L LE strength and ROM for full return to PLOF.    OBJECTIVE IMPAIRMENTS: Abnormal gait, decreased activity tolerance, decreased balance, decreased endurance, decreased mobility, difficulty walking, decreased ROM, decreased strength, increased edema, increased muscle spasms, impaired flexibility, improper body mechanics, and pain.    ACTIVITY LIMITATIONS: carrying, lifting, bending, standing, squatting, stairs, transfers, hygiene/grooming, locomotion level, and caring for others   PARTICIPATION LIMITATIONS: meal prep, cleaning, laundry, shopping, community activity, and exercise   PERSONAL FACTORS: Fitness, Time since onset of injury/illness/exacerbation, past experiences, and 2+ comorbidites are also affecting patient's functional outcome.    REHAB POTENTIAL: Good   CLINICAL DECISION MAKING: Stable/uncomplicated   EVALUATION COMPLEXITY: Low     GOALS:     SHORT TERM GOALS: Target date: 07/22/2024         Pt will become independent with HEP in order to demonstrate synthesis of PT education.  Baseline: Goal status: INITIAL   2.  Pt will have an at least 18  pt improvement in LEFS measure in order to demonstrate MCID improvement in daily function.   Baseline: Goal status: INITIAL   3.  Patient will demonstrate full L knee ROM for transfers, stairs, ambulation. Baseline:  Goal status: INITIAL       LONG TERM GOALS: Target date:   07/06/2024      Pt  will become independent with final HEP in order to demonstrate synthesis  of PT education. Baseline:  Goal status: INITIAL   2.  Pt will have an at least 27 pt improvement in LEFS measure in order to demonstrate MCID improvement in daily function.  Baseline:  Goal status: INITIAL   3.  Pt will be able to lift/squat/hold >25 lbs in order to demonstrate functional improvement in lumbopelvic strength for return to light and normal ADL.  Baseline:  Goal status: INITIAL   4.  Pt will be able to demonstrate full reciprocal stair management without UE in order to demonstrate functional improvement in LE function for self-care and daily mobility.    Baseline:  Goal status: INITIAL   5.  Pt will be able to demonstrate at least 80% Quad Index in order to demonstrate functional improvement of surgical knee quad strength for advancing to next phase of protocol.    Baseline:  Goal status: INITIAL      PLAN:   PT FREQUENCY: 1-2x/week   PT DURATION: 12 weeks   PLANNED INTERVENTIONS: Therapeutic exercises, Therapeutic activity, Neuromuscular re-education, Balance training, Gait training, Patient/Family education, Joint manipulation, Joint mobilization, Stair training, Orthotic/Fit training, DME instructions, Aquatic Therapy, Dry Needling, Electrical stimulation, Spinal manipulation, Spinal mobilization, Cryotherapy, Moist heat, Compression bandaging, scar mobilization, Splintting, Taping, Traction, Ultrasound, Ionotophoresis 4mg /ml Dexamethasone , and Manual therapy   PLAN FOR NEXT SESSION: f/u with HEP, progress per MACI protocol     Rojean JONELLE Batten, PT 06/04/2024, 7:52 AM

## 2024-06-06 ENCOUNTER — Other Ambulatory Visit (HOSPITAL_BASED_OUTPATIENT_CLINIC_OR_DEPARTMENT_OTHER): Payer: Self-pay

## 2024-06-06 ENCOUNTER — Other Ambulatory Visit (HOSPITAL_BASED_OUTPATIENT_CLINIC_OR_DEPARTMENT_OTHER): Payer: Self-pay | Admitting: Student

## 2024-06-06 ENCOUNTER — Encounter: Payer: Self-pay | Admitting: Internal Medicine

## 2024-06-06 DIAGNOSIS — N951 Menopausal and female climacteric states: Secondary | ICD-10-CM

## 2024-06-06 MED ORDER — OXYCODONE HCL 5 MG PO TABS: 5.0000 mg | ORAL_TABLET | ORAL | 0 refills | Status: DC | PRN

## 2024-06-07 ENCOUNTER — Encounter (HOSPITAL_BASED_OUTPATIENT_CLINIC_OR_DEPARTMENT_OTHER): Payer: Self-pay | Admitting: Orthopaedic Surgery

## 2024-06-07 MED ORDER — VEOZAH 45 MG PO TABS: 1.0000 | ORAL_TABLET | Freq: Every day | ORAL | 11 refills | Status: DC

## 2024-06-08 ENCOUNTER — Encounter (HOSPITAL_BASED_OUTPATIENT_CLINIC_OR_DEPARTMENT_OTHER): Payer: Self-pay | Admitting: Physical Therapy

## 2024-06-08 ENCOUNTER — Ambulatory Visit (HOSPITAL_BASED_OUTPATIENT_CLINIC_OR_DEPARTMENT_OTHER): Admitting: Physical Therapy

## 2024-06-08 ENCOUNTER — Ambulatory Visit (HOSPITAL_BASED_OUTPATIENT_CLINIC_OR_DEPARTMENT_OTHER): Admitting: Orthopaedic Surgery

## 2024-06-08 ENCOUNTER — Other Ambulatory Visit (HOSPITAL_BASED_OUTPATIENT_CLINIC_OR_DEPARTMENT_OTHER): Payer: Self-pay

## 2024-06-08 DIAGNOSIS — S83262A Peripheral tear of lateral meniscus, current injury, left knee, initial encounter: Secondary | ICD-10-CM

## 2024-06-08 DIAGNOSIS — M25562 Pain in left knee: Secondary | ICD-10-CM | POA: Diagnosis not present

## 2024-06-08 DIAGNOSIS — R262 Difficulty in walking, not elsewhere classified: Secondary | ICD-10-CM

## 2024-06-08 DIAGNOSIS — R600 Localized edema: Secondary | ICD-10-CM

## 2024-06-08 DIAGNOSIS — M6281 Muscle weakness (generalized): Secondary | ICD-10-CM

## 2024-06-08 NOTE — Progress Notes (Signed)
 Post Operative Evaluation    Procedure/Date of Surgery: Status post left knee matrix associated chondrocyte implantation 7/8  Interval History:    Presents 2 weeks status post above procedure overall doing extremely well.  She is working on range of motion of the knee which is going quite well.  She has been having some swelling.   PMH/PSH/Family History/Social History/Meds/Allergies:    Past Medical History:  Diagnosis Date   ADD (attention deficit disorder with hyperactivity)    Allergy     Anxiety    Arthritis    Atypical chest pain 07/26/2021   Atypical chest pain     Benign intracranial hypertension 06/16/2008   Qualifier: Diagnosis of   By: Zimonjic, Milica         Choking    due to food and pills get stuck   Cholelithiasis and acute cholecystitis without obstruction 12/02/2016   Chronic cough 12/27/2013   Depression    Dry mouth 08/12/2017   Dysmenorrhea    Family history of adverse reaction to anesthesia    pt's father has hx. of being hard to wake up post-op   Gastric ulcer 03/16/2015   GERD (gastroesophageal reflux disease)    Headache 07/09/2007   Qualifier: Diagnosis of   By: Tammie MD, Bruce      Replacing diagnoses that were inactivated after the 02/16/23 regulatory import     History of gastric ulcer    History of kidney stones    Hyperlipidemia    IBS (irritable bowel syndrome)    Lower extremity edema 07/26/2021   Lower extremity edema     Migraines    neurologist-  dr c. hagen ([facility] in [place])-- treated with Emgality injection every 30 days   Mild obstructive sleep apnea    per study in epic 03-21-2017 mild osa, recommendation cpap, mouth appliance, wt loss   Nausea and vomiting 12/03/2016   OAB (overactive bladder) 08/12/2017   Osteoarthritis    right ankle   Osteochondral lesion 09/2016   right ankle   Osteomyelitis of knee region [facility])    Peripheral vascular disease (HCC)    NERVE  DAMAGE RIGHT LEG AND FOOT DUE TO INJURGY.    Plantar fasciitis of left foot    Pleural effusion 12/15/2013   PONV (postoperative nausea and vomiting)    AND HEADACHES   Pseudotumor cerebri    at back head   RUQ pain    S/P dilatation of esophageal stricture 07/2018   Sinusitis 08/12/2018   RESOLVED WITH ANTIOBIOTIC   SUI (stress urinary incontinence, female)    Wears contact lenses    Wears partial dentures    upper   Past Surgical History:  Procedure Laterality Date   ANKLE ARTHROSCOPY Right 10/22/2016   Procedure: ANKLE ARTHROSCOPY;  Surgeon: Donnice JONELLE Fees, DPM;  Location: [facility];  Service: Podiatry;  Laterality: Right;  GENERAL/REG BLOCK   ANKLE ARTHROSCOPY Right 09-15-2017   @[facility]    BLADDER SURGERY  child   x 2 - as a child to stretch bladder   CESAREAN SECTION  12/24/2009   CESAREAN SECTION  11/20/2011   Procedure: CESAREAN SECTION;  Surgeon: Charlie JINNY Flowers, MD;  Location: [facility];  Service: Gynecology;  Laterality: N/A;   CHOLECYSTECTOMY N/A 12/03/2016   Procedure: LAPAROSCOPIC CHOLECYSTECTOMY WITH INTRAOPERATIVE CHOLANGIOGRAM;  Surgeon: Krystal Spinner, MD;  Location: [facility];  Service: General;  Laterality: N/A;   CHONDROPLASTY Right 01/12/2024   Procedure: RIGHT KNEE ARTHROSCOPY LATERAL FEMORAL CHONDROPLASTY;  Surgeon: Genelle Standing, MD;  Location: [facility];  Service: Orthopedics;  Laterality: Right;   CYSTOSCOPY WITH RETROGRADE PYELOGRAM, URETEROSCOPY AND STENT PLACEMENT Left 08/10/2006   ESOPHAGOGASTRODUODENOSCOPY (EGD) WITH ESOPHAGEAL DILATION  07/2018   HARDWARE REMOVAL Right 07/01/2016   Procedure: HARDWARE REMOVAL;  Surgeon: Glendia Cordella Hutchinson, MD;  Location: [facility];  Service: Orthopedics;  Laterality: Right;   HARVEST BONE GRAFT Right 01/12/2024   Procedure: RIGHT BONE MARROW ASPIRATE FROM ILIAC CREST DECOMPRESSION AND PLACEMENT RIGHT LATERAL FEMORAL CONDYLE;  Surgeon: Genelle Standing, MD;  Location: [facility];  Service:  Orthopedics;  Laterality: Right;   IMPLANTATION, MATRIX-INDUCED AUTOLOGOUS CHONDROCYTES Left 05/24/2024   Procedure: IMPLANTATION, MATRIX-INDUCED AUTOLOGOUS CHONDROCYTES;  Surgeon: Genelle Standing, MD;  Location: [facility];  Service: Orthopedics;  Laterality: Left;  LEFT KNEE MATRIX ASSOCIATED CHONDROCYTE IMPLANTATION   KNEE ARTHROSCOPY Left 05/24/2024   Procedure: ARTHROSCOPY, KNEE;  Surgeon: Genelle Standing, MD;  Location: [facility];  Service: Orthopedics;  Laterality: Left;   KNEE ARTHROSCOPY WITH LATERAL RELEASE Right 12/13/2014   Procedure: KNEE ARTHROSCOPY WITH LATERAL RELEASE;  Surgeon: LELON JONETTA Shari Mickey., MD;  Location: [facility];  Service: Orthopedics;  Laterality: Right;   LAPAROSCOPIC APPENDECTOMY N/A 06/11/2014   Procedure: APPENDECTOMY LAPAROSCOPIC;  Surgeon: Krystal CHRISTELLA Spinner, MD;  Location: [facility];  Service: General;  Laterality: N/A;   ORIF ANKLE FRACTURE Right 01/05/2016   Procedure: OPEN REDUCTION INTERNAL FIXATION (ORIF) ANKLE FRACTURE;  Surgeon: Glendia Cordella Hutchinson, MD;  Location[facility];  Service: Orthopedics;  Laterality: Right;   OVARIAN CYST SURGERY Right 03/05/2007   AND CHROMOPERTUBATION  VIA LAPAROSCOPY   ROBOTIC ASSISTED LAPAROSCOPIC HYSTERECTOMY AND SALPINGECTOMY Bilateral 08/27/2018   Procedure: XI ROBOTIC ASSISTED LAPAROSCOPIC HYSTERECTOMY AND BILATERAL SALPINGECTOMY;  Surgeon: Gorge Ade, MD;  Location: [facility];  Service: Gynecology;  Laterality: Bilateral;  [facility] for 23hr OBS   SYNOVECTOMY Right 12/13/2014   Procedure: PLICA SYNOVECTOMY;  Surgeon: LELON JONETTA Shari Mickey., MD;  Location:  [facility];  Service: Orthopedics;  Laterality: Right;   TONSILLECTOMY AND ADENOIDECTOMY  child   WISDOM TOOTH EXTRACTION     Social History   Socioeconomic History   Marital status: Married    Spouse name: Not on file   Number of children: 2   Years of education: Not on file   Highest education level: Not on file  Occupational  History   Occupation: Investment banker, 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  Tobacco Use   Smoking status: Former    Current packs/day: 0.50    Average packs/day: 0.5 packs/day for 15.0 years (7.5 ttl pk-yrs)    Types: Cigarettes   Smokeless tobacco: Never  Vaping Use   Vaping status: Every Day   Substances: Nicotine   Substance and Sexual Activity   Alcohol use: Yes    Comment: occasional   Drug use: No   Sexual activity: Yes    Birth control/protection: Surgical    Comment: partial hysterectomy  Other Topics Concern   Not on file  Social History Narrative   Not on file   Social Drivers of Health   Financial Resource Strain: Not on file  Food Insecurity: Not on file  Transportation Needs: Not on file  Physical Activity: Not on file  Stress: Not on  file  Social Connections: Unknown (03/26/2022)   Received from [facility]   Social Network    Social Network: Not on file   Family History  Problem Relation Age of Onset   Learning disabilities Mother    Asthma Mother    Arthritis Mother    Irritable bowel syndrome Mother    Miscarriages / Stillbirths Mother    Hypertension Father    Early death Father    Drug abuse Father    Depression Father    Cancer Father    Arthritis Father    Alcohol abuse Father    Brain cancer Father    Anesthesia problems Father        hard to wake up post-op   Lung cancer Father    Hyperlipidemia Maternal Grandmother    Hypertension Maternal Grandmother    Cancer Maternal Grandmother    Asthma Maternal Grandmother    Arthritis Maternal Grandmother    Breast cancer Maternal Grandmother    Ovarian cancer Maternal Grandmother    Diabetes Maternal Grandmother    Heart disease Maternal Grandmother    Heart attack Maternal Grandmother    Hypertension Maternal Grandfather    Hyperlipidemia Maternal Grandfather    Arthritis Maternal Grandfather    Alcohol abuse Maternal Grandfather    Cancer Maternal Grandfather    Prostate  cancer Maternal Grandfather    Melanoma Maternal Grandfather    Cancer - Prostate Maternal Grandfather    Hypertension Paternal Grandmother    COPD Paternal Grandmother    Arthritis Paternal Grandmother    Learning disabilities Daughter    Learning disabilities Son    Colon cancer Neg Hx    Allergies  Allergen Reactions   Droperidol  Other (See Comments)    Jittery and restlessness   Metoclopramide  Other (See Comments)    Reglan    Nickel Rash    any jewelry that's not real/ rash and itching    Prochlorperazine Other (See Comments)   Nsaids Other (See Comments)    DUE TO HISTORY OF GASTRIC ULCER   Tolmetin Itching and Other (See Comments)    DUE TO HISTORY OF GASTRIC ULCER  DUE TO HISTORY OF GASTRIC ULCER  DUE TO HISTORY OF GASTRIC ULCER, DUE TO HISTORY OF GASTRIC ULCER   Oxybutynin  Other (See Comments)    severe dry mouth   Adhesive [Tape] Rash   Metoclopramide  Hcl Other (See Comments)    RESTLESSNESS/JITTERY   Prochlorperazine Edisylate Other (See Comments)    RESTLESSNESS/JITTERY    Sulfa Antibiotics Rash   Current Outpatient Medications  Medication Sig Dispense Refill   ADDERALL XR 20 MG 24 hr capsule Take 20 mg by mouth every morning.  0   albuterol  (PROVENTIL  HFA;VENTOLIN  HFA) 108 (90 Base) MCG/ACT inhaler Inhale 2 puffs into the lungs every 6 (six) hours as needed for wheezing or shortness of breath.      ALPRAZolam (XANAX) 0.25 MG tablet SMARTSIG:0.5 Tablet(s) By Mouth PRN     aspirin  EC 325 MG tablet Take 1 tablet (325 mg total) by mouth daily. (Patient not taking: Reported on 05/16/2024) 14 tablet 0   atorvastatin  (LIPITOR) 40 MG tablet TAKE 1 TABLET BY MOUTH EVERY DAY 90 tablet 1   Bacillus Coagulans-Inulin (PROBIOTIC) 1-250 BILLION-MG CAPS      cetirizine (ZYRTEC) 10 MG tablet Take by mouth.     diclofenac  Sodium (VOLTAREN ) 1 % GEL APPLY TO AFFECTED AREA AS INSTRUCTED     EMGALITY 120 MG/ML SOAJ Inject 120 mg into the skin every  30 (thirty) days.  4    estradiol  (VIVELLE -DOT) 0.05 MG/24HR patch Place 1 patch (0.05 mg total) onto the skin 2 (two) times a week. 8 patch 12   Fezolinetant  (VEOZAH ) 45 MG TABS Take 1 tablet (45 mg total) by mouth daily at 6 (six) AM. 30 tablet 11   fluconazole  (DIFLUCAN ) 100 MG tablet Take 1 tablet (100 mg total) by mouth daily. 30 tablet 2   fluconazole  (DIFLUCAN ) 150 MG tablet Take 1 tablet (150 mg total) by mouth every three (3) days as needed. 2 tablet 0   fluticasone  (FLONASE ) 50 MCG/ACT nasal spray Place 2 sprays into both nostrils daily. 16 g 6   gabapentin  (NEURONTIN ) 300 MG capsule TAKE 2 CAPSULES BY MOUTH 3 TIMES DAILY (Patient taking differently: Take 600-1,200 mg by mouth See admin instructions. Take 600mg  by mouth in the morning and 1,200mg  in the evening) 180 capsule 4   lubiprostone (AMITIZA) 24 MCG capsule TAKE ONE CAPSULE (24 MCG DOSE) BY MOUTH 2 (TWO) TIMES DAILY WITH MEALS. 180 capsule 1   magnesium  oxide (MAG-OX) 400 MG tablet Take by mouth.     MELATONIN GUMMIES PO Take 1-2 each by mouth at bedtime.     mirtazapine (REMERON) 30 MG tablet Take 30 mg by mouth daily.     Multiple Vitamins-Minerals (EQL CENTURY WOMENS) TABS      NUCYNTA 100 MG TABS Take 1 tablet by mouth every 8 (eight) hours as needed.     omeprazole (PRILOSEC) 40 MG capsule Take 40 mg by mouth daily.     ondansetron  (ZOFRAN -ODT) 8 MG disintegrating tablet Take 1 tablet by mouth every 8 (eight) hours as needed.     oxyCODONE  (ROXICODONE ) 5 MG immediate release tablet Take 1 tablet (5 mg total) by mouth every 4 (four) hours as needed for severe pain (pain score 7-10) or breakthrough pain. 10 tablet 0   pantoprazole  (PROTONIX ) 20 MG tablet Take 20 mg by mouth 2 (two) times daily. (Patient not taking: Reported on 05/16/2024)     promethazine  (PHENERGAN ) 25 MG tablet Take 1 tablet (25 mg total) by mouth every 8 (eight) hours as needed for nausea or vomiting. 10 tablet 0   senna-docusate (SENOKOT-S) 8.6-50 MG tablet Take 1 tablet by mouth  daily. 10 tablet 0   tirzepatide  (ZEPBOUND ) 2.5 MG/0.5ML Pen Inject 2.5 mg into the skin once a week. (Patient not taking: Reported on 05/16/2024) 2 mL 11   tiZANidine  (ZANAFLEX ) 4 MG tablet Take 4 mg by mouth every 6 (six) hours as needed for muscle spasms.      traZODone (DESYREL) 50 MG tablet Take 1-2 tablets by mouth at bedtime as needed.     venlafaxine  XR (EFFEXOR -XR) 150 MG 24 hr capsule Take 2 capsules (300 mg total) by mouth daily before breakfast. 60 capsule 11   VEOZAH  45 MG TABS TAKE 1 TABLET (45 MG TOTAL) BY MOUTH DAILY AT 6 (SIX) AM. 30 tablet 11   zonisamide (ZONEGRAN) 100 MG capsule Take 100 mg by mouth at bedtime.     No current facility-administered medications for this visit.   No results found.  Review of Systems:   A ROS was performed including pertinent positives and negatives as documented in the HPI.   Musculoskeletal Exam:    Last menstrual period 08/26/2018.  Left knee with well-appearing incision.  There is no erythema or drainage.  Range of motion is from 0 to 45 degrees.  There is some quad atrophy with an effusion about the knee.  Distal neurosensory exam is intact  Imaging:      I personally reviewed and interpreted the radiographs.   Assessment:   2 weeks status post left knee matrix associated chondrocyte implantation overall doing extremely well.  At this time she will continue to weight-bear as tolerated and progress along the [facility] protocol.  I will plan to see her back in 4 weeks for reassessment  Plan :    - Return to clinic 4 weeks for reassessment      I personally saw and evaluated the patient, and participated in the management and treatment plan.  Elspeth Parker, MD Attending Physician, Orthopedic Surgery  This document was dictated using Dragon voice recognition software. A reasonable attempt at proof reading has been made to minimize errors.

## 2024-06-08 NOTE — Therapy (Signed)
 OUTPATIENT PHYSICAL THERAPY LOWER EXTREMITY TREATMENT   Patient Name: Diane [name] MRN: [id number] DOB:03/06/1981, 43 y.o., female Today's Date: 06/09/2024  END OF SESSION:  PT End of Session - 06/08/24 1632     Visit Number 3    Number of Visits 21    Date for PT Re-Evaluation 07/12/24    Authorization Type [employer] 20 VL    PT Start Time 1628    PT Stop Time 1704    PT Time Calculation (min) 36 min    Activity Tolerance Patient tolerated treatment well    Behavior During Therapy WFL for tasks assessed/performed             Past Medical History:  Diagnosis Date   ADD (attention deficit disorder with hyperactivity)    Allergy     Anxiety    Arthritis    Atypical chest pain 07/26/2021   Atypical chest pain     Benign intracranial hypertension 06/16/2008   Qualifier: Diagnosis of   By: Donzell Smolder         Choking    due to food and pills get stuck   Cholelithiasis and acute cholecystitis without obstruction 12/02/2016   Chronic cough 12/27/2013   Depression    Dry mouth 08/12/2017   Dysmenorrhea    Family history of adverse reaction to anesthesia    pt's father has hx. of being hard to wake up post-op   Gastric ulcer 03/16/2015   GERD (gastroesophageal reflux disease)    Headache 07/09/2007   Qualifier: Diagnosis of   By: Tammie MD, Bruce      Replacing diagnoses that were inactivated after the 02/16/23 regulatory import     History of gastric ulcer    History of kidney stones    Hyperlipidemia    IBS (irritable bowel syndrome)    Lower extremity edema 07/26/2021   Lower extremity edema     Migraines    neurologist-  dr c. hagen ([facility] in [place])-- treated with Emgality injection every 30 days   Mild obstructive sleep apnea    per study in epic 03-21-2017 mild osa, recommendation cpap, mouth appliance, wt loss   Nausea and vomiting 12/03/2016   OAB (overactive bladder) 08/12/2017   Osteoarthritis    right ankle   Osteochondral  lesion 09/2016   right ankle   Osteomyelitis of knee region [facility])    Peripheral vascular disease (HCC)    NERVE DAMAGE RIGHT LEG AND FOOT DUE TO INJURGY.    Plantar fasciitis of left foot    Pleural effusion 12/15/2013   PONV (postoperative nausea and vomiting)    AND HEADACHES   Pseudotumor cerebri    at back head   RUQ pain    S/P dilatation of esophageal stricture 07/2018   Sinusitis 08/12/2018   RESOLVED WITH ANTIOBIOTIC   SUI (stress urinary incontinence, female)    Wears contact lenses    Wears partial dentures    upper   Past Surgical History:  Procedure Laterality Date   ANKLE ARTHROSCOPY Right 10/22/2016   Procedure: ANKLE ARTHROSCOPY;  Surgeon: Donnice JONELLE Fees, DPM;  Location: [facility];  Service: Podiatry;  Laterality: Right;  GENERAL/REG BLOCK   ANKLE ARTHROSCOPY Right 09-15-2017   @[facility]    BLADDER SURGERY  child   x 2 - as a child to stretch bladder   CESAREAN SECTION  12/24/2009   CESAREAN SECTION  11/20/2011   Procedure: CESAREAN SECTION;  Surgeon: Charlie JINNY Flowers, MD;  Location: [facility]  ORS;  Service: Gynecology;  Laterality: N/A;   CHOLECYSTECTOMY N/A 12/03/2016   Procedure: LAPAROSCOPIC CHOLECYSTECTOMY WITH INTRAOPERATIVE CHOLANGIOGRAM;  Surgeon: Krystal Spinner, MD;  Location: [facility];  Service: General;  Laterality: N/A;   CHONDROPLASTY Right 01/12/2024   Procedure: RIGHT KNEE ARTHROSCOPY LATERAL FEMORAL CHONDROPLASTY;  Surgeon: Genelle Standing, MD;  Location: [facility];  Service: Orthopedics;  Laterality: Right;   CYSTOSCOPY WITH RETROGRADE PYELOGRAM, URETEROSCOPY AND STENT PLACEMENT Left 08/10/2006   ESOPHAGOGASTRODUODENOSCOPY (EGD) WITH ESOPHAGEAL DILATION  07/2018   HARDWARE REMOVAL Right 07/01/2016   Procedure: HARDWARE REMOVAL;  Surgeon: Glendia Cordella Hutchinson, MD;  Location: [facility];  Service: Orthopedics;  Laterality: Right;   HARVEST BONE GRAFT Right 01/12/2024   Procedure: RIGHT BONE MARROW ASPIRATE FROM ILIAC CREST DECOMPRESSION AND  PLACEMENT RIGHT LATERAL FEMORAL CONDYLE;  Surgeon: Genelle Standing, MD;  Location: [facility];  Service: Orthopedics;  Laterality: Right;   IMPLANTATION, MATRIX-INDUCED AUTOLOGOUS CHONDROCYTES Left 05/24/2024   Procedure: IMPLANTATION, MATRIX-INDUCED AUTOLOGOUS CHONDROCYTES;  Surgeon: Genelle Standing, MD;  Location: [facility];  Service: Orthopedics;  Laterality: Left;  LEFT KNEE MATRIX ASSOCIATED CHONDROCYTE IMPLANTATION   KNEE ARTHROSCOPY Left 05/24/2024   Procedure: ARTHROSCOPY, KNEE;  Surgeon: Genelle Standing, MD;  Location: [facility];  Service: Orthopedics;  Laterality: Left;   KNEE ARTHROSCOPY WITH LATERAL RELEASE Right 12/13/2014   Procedure: KNEE ARTHROSCOPY WITH LATERAL RELEASE;  Surgeon: LELON JONETTA Shari Mickey., MD;  Location: [facility];  Service: Orthopedics;  Laterality: Right;   LAPAROSCOPIC APPENDECTOMY N/A 06/11/2014   Procedure: APPENDECTOMY LAPAROSCOPIC;  Surgeon: Krystal CHRISTELLA Spinner, MD;  Location: [facility];  Service: General;  Laterality: N/A;   ORIF ANKLE FRACTURE Right 01/05/2016   Procedure: OPEN REDUCTION INTERNAL FIXATION (ORIF) ANKLE FRACTURE;  Surgeon: Glendia Cordella Hutchinson, MD;  Location: [facility];  Service: Orthopedics;  Laterality: Right;   OVARIAN CYST SURGERY Right 03/05/2007   AND CHROMOPERTUBATION  VIA LAPAROSCOPY   ROBOTIC ASSISTED LAPAROSCOPIC HYSTERECTOMY AND SALPINGECTOMY Bilateral 08/27/2018   Procedure: XI ROBOTIC ASSISTED LAPAROSCOPIC HYSTERECTOMY AND BILATERAL SALPINGECTOMY;  Surgeon: Gorge Ade, MD;  Location: [facility];  Service: Gynecology;  Laterality: Bilateral;  [facility] for 23hr OBS   SYNOVECTOMY Right 12/13/2014   Procedure: PLICA SYNOVECTOMY;  Surgeon: LELON JONETTA Shari Mickey., MD;  Location: [facility];  Service: Orthopedics;  Laterality: Right;   TONSILLECTOMY AND ADENOIDECTOMY  child   WISDOM TOOTH EXTRACTION     Patient Active Problem List   Diagnosis Date Noted   Chondromalacia of left patella 05/24/2024    Acute injury of left knee cartilage 05/24/2024   Peripheral tear of lateral meniscus of left knee as current injury 05/24/2024   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 01/12/2024   AVN (avascular necrosis of bone) (HCC) 01/12/2024   Prediabetes 07/14/2023   Weight disorder 07/14/2023   Ankle swelling 07/14/2023   Menopausal syndrome (hot flushes) 07/13/2023   Hyperlipidemia 06/19/2023   Intractable pain 06/17/2023   Chronic constipation 06/17/2023   Other idiopathic scoliosis, lumbar region 06/17/2023   Dysphagia 08/12/2017   Osteochondral lesion 06/03/2017   OSA (obstructive sleep apnea) 03/24/2017   Ankle pain, chronic 01/05/2016   Knee pain, right, chronic 04/12/2015   Chronic migraine without aura 04/01/2013   DEPRESSION, ACUTE 10/02/2008   Opioid use disorder in remission 10/02/2008   back pain, chronic 03/28/2008   Obesity (BMI 30-39.9) 01/25/2008   Anxiety state 11/09/2007    PCP: Jesus Bernardino MATSU, MD   REFERRING PROVIDER: Genelle,  Elspeth, MD   REFERRING DIAG:  D16.737J (ICD-10-CM) - Peripheral tear of lateral meniscus of left knee as current injury, initial encounter      THERAPY DIAG:  Left knee pain, unspecified chronicity  Muscle weakness (generalized)  Difficulty in walking, not elsewhere classified  Localized edema  Rationale for Evaluation and Treatment: Rehabilitation  ONSET DATE: 04/05/2024 DOS for meniscal repair  05/24/2024 MACI procedure   PROCEDURE: 1. Left knee lateral meniscal repair with cartilage biopsy   SUBJECTIVE:   PROCEDURE: 1.  Left knee lateral meniscal repair Left MACI repair    SUBJECTIVE:    SUBJECTIVE STATEMENT:  Pt is 2 weeks and 1 day s/p MACI procedure.  Pt saw MD earlier today and he states it looked good.  Pt states MD informed her she would need to use the brace a minimum of 2 more weeks.  Pt states she had much increased pain after prior Rx.  Pt reports having increased swelling after prior Rx.  Pt states the  pain and swelling improved the following day.  Pt has been performing quad sets and ankle pumps  Pt tries the supine SLR though unable to perform well.     PERTINENT HISTORY: Anxiety, arthritis, depression, PVD, LBP  PAIN:  Are you having pain? Yes: NPRS scale: 6-7/10, 11/10 at worst  Pain location: left anterior knee surgical site Pain description: aching Aggravating factors: movement; transitions Relieving factors: rest   PRECAUTIONS: Knee   RED FLAGS: None      WEIGHT BEARING RESTRICTIONS: WBAT with brace locked in extension   FALLS:  Has patient fallen in last 6 months? No   LIVING ENVIRONMENT:  Lives with family- 2 children Staying with mother on couch for now  2 story home Crutches and walker    OCCUPATION:     PLOF: Independent   PATIENT GOALS: return to walking, return to exercise to help with her weight loss   OBJECTIVE:  Note: Objective measures were completed at Evaluation unless otherwise noted.   DIAGNOSTIC FINDINGS:  IMPRESSION: Mild chondromalacia of the lateral facet of the patella. No full-thickness cartilage defect is identified. There is a moderate-sized reactive joint effusion.   Abnormal signal in the anterior horn and anterior body of the lateral meniscus suggesting either an intrameniscal tear or dissecting intrameniscal cyst. Correlation for anterior lateral joint line pain. No displaced meniscal tear is present.   Electronically signed by: Norleen Satchel MD 03/10/2024 05:30 PM EDT RP Workstation: [id number]   PATIENT SURVEYS:  LEFS 20/80   COGNITION: Overall cognitive status: Within functional limits for tasks assessed                         SENSATION: WFL   EDEMA:  Mild into lateral and medial patellar pouches    POSTURE: No Significant postural limitations   PALPATION: No TTP noted around incision sites, clean and dry   LOWER EXTREMITY ROM:   Active ROM Right eval Left eval  Hip flexion      Hip extension       Hip abduction      Hip adduction      Hip internal rotation      Hip external rotation      Knee flexion  WNL 75  Knee extension  WNL 0  Ankle dorsiflexion      Ankle plantarflexion      Ankle inversion      Ankle eversion       (Blank  rows = not tested) *= pain/symptoms   LOWER EXTREMITY MMT:    deferred due to surgical precautions.  Able to perform single SLR without extensor lag     GAIT: Distance walked: 74ft feet  Assistive device utilized: bilat axillary crutches Level of assistance: Modified independence Comments:  step through   TODAY'S TREATMENT:                                                                                                                              DATE:  7/22  Reviewed pt presentation, pain levels, HEP compliance, and response to prior Rx. Reviewed HEP.  Supine SLR 2x10 with brace locked and min assist from PT Quad sets 2x10 with 5 sec hold Glute sets with 5 sec hold 2x10 S/L hip abduction 3x10  Pt received gentle L knee flexion and extension PROM in supine per pt and tissue tolerance w/n protocol range. Supine Heel slides with strap AAROM x 10 reps w/n protocol range  L knee flexion AAROM:  90 deg     7/17 -bandage change (gauze and new ACE wrap) -quad sets 5 2x10 -SLR with clinician assist x5, x4 - bridge with LES on physioball 3x10 -s/l hip abduction 3x10 -gait in hall with brace and bil crutches x195ft    Previous: Bandage change- no s/s of infection (no Tegaderm used-pt is highly allergic);  gauze replaced and new compression bandage applied; pt able to use paper tape  There-ex: gentle PROM into all planes        PATIENT EDUCATION:  Education details:  PT instructed she could perform glute sets at home.  protocol/precautions, protocol ROM limits, diagnosis, prognosis, anatomy, exercise progression, muscle firing,  HEP, POC  Person educated: Patient Education method: Explanation, Demonstration, verbal and tactile  cues Education comprehension: verbalized understanding, returned demonstration, verbal cues required, and tactile cues required   HOME EXERCISE PROGRAM:  Access Code: [email or web address] URL: [email or web address] Date: 04/13/2024 Prepared by: Dale Call   ASSESSMENT:   CLINICAL IMPRESSION: Pt saw MD today and states she will need to use the brace for at least 2 more weeks.  Pt performed exercises per protocol with cuing and instruction in correct form.  Pt continues to have difficulty with supine SLR though seems to be better overall.  Pt performed supine SLR in brace and was able to perform supine SLR with min assist from PT.  PT performed gentle knee flexion/extension PROM w/n protocol ranges and she tolerated it well.  Pt performed supine flexion AAROM with heel slides with strap and already achieved knee flexion protocol limits.  PT educated pt in protocol limits and instructed pt to not go past 90 deg due to protocol.  She responded well to Rx stating she felt better after Rx.  Pt should benefit from cont skilled PT per protocol to address goals and impairments and to assist in restoring desired level of function.      OBJECTIVE IMPAIRMENTS: Abnormal gait, decreased  activity tolerance, decreased balance, decreased endurance, decreased mobility, difficulty walking, decreased ROM, decreased strength, increased edema, increased muscle spasms, impaired flexibility, improper body mechanics, and pain.    ACTIVITY LIMITATIONS: carrying, lifting, bending, standing, squatting, stairs, transfers, hygiene/grooming, locomotion level, and caring for others   PARTICIPATION LIMITATIONS: meal prep, cleaning, laundry, shopping, community activity, and exercise   PERSONAL FACTORS: Fitness, Time since onset of injury/illness/exacerbation, past experiences, and 2+ comorbidites are also affecting patient's functional outcome.    REHAB POTENTIAL: Good   CLINICAL DECISION MAKING: Stable/uncomplicated    EVALUATION COMPLEXITY: Low     GOALS:     SHORT TERM GOALS: Target date: 07/22/2024         Pt will become independent with HEP in order to demonstrate synthesis of PT education.  Baseline: Goal status: INITIAL   2.  Pt will have an at least 18  pt improvement in LEFS measure in order to demonstrate MCID improvement in daily function.   Baseline: Goal status: INITIAL   3.  Patient will demonstrate full L knee ROM for transfers, stairs, ambulation. Baseline:  Goal status: INITIAL       LONG TERM GOALS: Target date:   07/06/2024      Pt  will become independent with final HEP in order to demonstrate synthesis of PT education. Baseline:  Goal status: INITIAL   2.  Pt will have an at least 27 pt improvement in LEFS measure in order to demonstrate MCID improvement in daily function.   Baseline:  Goal status: INITIAL   3.  Pt will be able to lift/squat/hold >25 lbs in order to demonstrate functional improvement in lumbopelvic strength for return to light and normal ADL.  Baseline:  Goal status: INITIAL   4.  Pt will be able to demonstrate full reciprocal stair management without UE in order to demonstrate functional improvement in LE function for self-care and daily mobility.    Baseline:  Goal status: INITIAL   5.  Pt will be able to demonstrate at least 80% Quad Index in order to demonstrate functional improvement of surgical knee quad strength for advancing to next phase of protocol.    Baseline:  Goal status: INITIAL      PLAN:   PT FREQUENCY: 1-2x/week   PT DURATION: 12 weeks   PLANNED INTERVENTIONS: Therapeutic exercises, Therapeutic activity, Neuromuscular re-education, Balance training, Gait training, Patient/Family education, Joint manipulation, Joint mobilization, Stair training, Orthotic/Fit training, DME instructions, Aquatic Therapy, Dry Needling, Electrical stimulation, Spinal manipulation, Spinal mobilization, Cryotherapy, Moist heat, Compression  bandaging, scar mobilization, Splintting, Taping, Traction, Ultrasound, Ionotophoresis 4mg /ml Dexamethasone , and Manual therapy   PLAN FOR NEXT SESSION: f/u with HEP, progress per MACI protocol     Leigh Minerva III PT, DPT 06/09/24 5:08 PM

## 2024-06-15 ENCOUNTER — Encounter (HOSPITAL_BASED_OUTPATIENT_CLINIC_OR_DEPARTMENT_OTHER): Payer: Self-pay | Admitting: Physical Therapy

## 2024-06-15 ENCOUNTER — Ambulatory Visit (HOSPITAL_BASED_OUTPATIENT_CLINIC_OR_DEPARTMENT_OTHER): Admitting: Physical Therapy

## 2024-06-17 ENCOUNTER — Ambulatory Visit (INDEPENDENT_AMBULATORY_CARE_PROVIDER_SITE_OTHER): Admitting: Internal Medicine

## 2024-06-17 ENCOUNTER — Encounter: Payer: Self-pay | Admitting: Internal Medicine

## 2024-06-17 VITALS — BP 132/82 | HR 107 | Temp 98.3°F | Ht 67.0 in | Wt 252.8 lb

## 2024-06-17 DIAGNOSIS — M25561 Pain in right knee: Secondary | ICD-10-CM

## 2024-06-17 DIAGNOSIS — E669 Obesity, unspecified: Secondary | ICD-10-CM

## 2024-06-17 DIAGNOSIS — G43709 Chronic migraine without aura, not intractable, without status migrainosus: Secondary | ICD-10-CM | POA: Diagnosis not present

## 2024-06-17 DIAGNOSIS — R110 Nausea: Secondary | ICD-10-CM

## 2024-06-17 DIAGNOSIS — L2082 Flexural eczema: Secondary | ICD-10-CM

## 2024-06-17 DIAGNOSIS — R638 Other symptoms and signs concerning food and fluid intake: Secondary | ICD-10-CM | POA: Diagnosis not present

## 2024-06-17 DIAGNOSIS — F32A Depression, unspecified: Secondary | ICD-10-CM

## 2024-06-17 DIAGNOSIS — Z803 Family history of malignant neoplasm of breast: Secondary | ICD-10-CM

## 2024-06-17 DIAGNOSIS — G8929 Other chronic pain: Secondary | ICD-10-CM

## 2024-06-17 DIAGNOSIS — N951 Menopausal and female climacteric states: Secondary | ICD-10-CM

## 2024-06-17 DIAGNOSIS — G4700 Insomnia, unspecified: Secondary | ICD-10-CM

## 2024-06-17 MED ORDER — ONDANSETRON 8 MG PO TBDP: 8.0000 mg | ORAL_TABLET | Freq: Three times a day (TID) | ORAL | 11 refills | Status: DC | PRN

## 2024-06-17 MED ORDER — MIRTAZAPINE 15 MG PO TABS: 15.0000 mg | ORAL_TABLET | Freq: Every day | ORAL | 3 refills | Status: DC

## 2024-06-17 MED ORDER — TRIAMCINOLONE ACETONIDE 0.1 % EX CREA: 1.0000 | TOPICAL_CREAM | Freq: Two times a day (BID) | CUTANEOUS | 0 refills | Status: DC

## 2024-06-17 MED ORDER — PROGESTERONE MICRONIZED 100 MG PO CAPS: 300.0000 mg | ORAL_CAPSULE | Freq: Every day | ORAL | 4 refills | Status: AC

## 2024-06-17 MED ORDER — EMGALITY 120 MG/ML ~~LOC~~ SOAJ: 120.0000 mg | SUBCUTANEOUS | 4 refills | Status: AC

## 2024-06-17 NOTE — Progress Notes (Signed)
 ==============================  [facility]: [phone]   -- Medical Office Visit --  Patient: Diane [name]      Age: 43 y.o.       Sex:  female  Date:   06/17/2024 Today's Healthcare Provider: Bernardino KANDICE Cone, MD  ==============================   Chief Complaint: Headaches (emgality ) and hot flushes (failing gabapentin /Effexor /Veozah floy)   Discussed the use of AI scribe software for clinical note transcription with the patient, who gave verbal consent to proceed.  History of Present Illness 43 year old female who presents for follow-up and medication management.  She has been in a knee brace for most of the year following multiple surgeries, including a meniscus repair and a cartilage transplant using the MACI procedure. The brace is locked straight, significantly impacting her ability to ice skate and causing discomfort and limitations.  She has been using Emgality  for migraine management for several years, which has been effective. She is currently without a neurologist as her previous one moved away. She takes Emgality  120 mg monthly. No current kidney problems reported.  She is experiencing severe menopausal symptoms, including hot flashes that are not well controlled with her current treatment of Veozah  and an estrogen patch. A family history of breast and ovarian cancer influences her treatment options. She has previously tried various medications without success. Reports severe hot flashes.  She is concerned about her weight and has been discussing weight loss strategies, including dietary changes and potential medication options. She prefers sugary drinks like Dr. Nunzio, which she is attempting to limit to aid in weight loss.  She is currently taking mirtazapine  15 mg at night, which she has reduced from 30 mg due to concerns about weight gain. She also uses Zofran  as needed for nausea, particularly before certain  treatments.  She reports symptoms of eczema on her elbow and nose, which have been persistent and irritating. She has a history of working with a dermatologist and recognizes the need for a steroid cream to manage these symptoms.  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 Past Medical History:  has a past medical history of Acute injury of left knee cartilage (05/24/2024), ADD (attention deficit disorder with hyperactivity), Allergy , Anxiety, Arthritis, Atypical chest pain (07/26/2021), Benign intracranial hypertension (06/16/2008), Choking, Cholelithiasis and acute cholecystitis without obstruction (12/02/2016), Chronic cough (12/27/2013), Depression, Dry mouth (08/12/2017), Dysmenorrhea, Family history of adverse reaction to anesthesia, Gastric ulcer (03/16/2015), GERD (gastroesophageal reflux disease), Headache (07/09/2007), History of gastric ulcer, History of kidney stones, Hyperlipidemia, IBS (irritable bowel syndrome), Lower extremity edema (07/26/2021), Migraines, Mild obstructive sleep apnea, Nausea and vomiting (12/03/2016), OAB (overactive bladder) (08/12/2017), Osteoarthritis, Osteochondral lesion (09/2016), 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 Past Surgical History:   has a past surgical  history that includes Cesarean section (12/24/2009); Cesarean section (11/20/2011); laparoscopic appendectomy (N/A, 06/11/2014); Bladder surgery (child); Wisdom tooth extraction; Ovarian cyst surgery (Right, 03/05/2007); Cystoscopy with retrograde pyelogram, ureteroscopy and stent placement (Left, 08/10/2006); Synovectomy (Right, 12/13/2014); Knee arthroscopy with lateral release (Right, 12/13/2014); ORIF ankle fracture (Right, 01/05/2016); Hardware Removal (Right, 07/01/2016); Ankle arthroscopy (Right, 10/22/2016); Cholecystectomy (N/A, 12/03/2016); Ankle arthroscopy (Right, 09-15-2017   @[facility] ); Tonsillectomy and adenoidectomy (child); Esophagogastroduodenoscopy (egd) with esophageal dilation (07/2018); Robotic assisted laparoscopic hysterectomy and salpingectomy (Bilateral, 08/27/2018); Chondroplasty (Right, 01/12/2024); Harvest bone graft (Right, 01/12/2024); Implantation, matrix-induced autologous chondrocytes (Left, 05/24/2024); and Knee arthroscopy (Left, 05/24/2024). Social History:   reports that she has quit smoking. Her smoking use included cigarettes. She has a 7.5 pack-year smoking history. She has never used smokeless tobacco. She reports current alcohol use. She reports that she does not use drugs. Family History:  family history includes Alcohol abuse in her father and maternal grandfather; Anesthesia problems in her father; Arthritis in her father, maternal grandfather, maternal grandmother, mother, and paternal grandmother; Asthma in her maternal grandmother and mother; Brain cancer in her father; Breast cancer in her maternal grandmother; COPD in her paternal grandmother; Cancer in her father, maternal grandfather, and maternal  grandmother; Cancer - Prostate in her maternal grandfather; Depression in her father; Diabetes in her maternal grandmother; Drug abuse in her father; Early death in her father; Heart attack in her maternal grandmother; Heart disease in her maternal grandmother; Hyperlipidemia in her maternal grandfather and maternal grandmother; Hypertension in her father, maternal grandfather, maternal grandmother, and paternal grandmother; Irritable bowel syndrome in her mother; Learning disabilities in her daughter, mother, and son; Lung cancer in her father; Melanoma in her maternal grandfather; Miscarriages / [place] in her mother; Ovarian cancer in her maternal grandmother; Prostate cancer in her maternal grandfather. Allergies:  is allergic to droperidol , metoclopramide , nickel, prochlorperazine, nsaids, tolmetin, oxybutynin , adhesive [tape], metoclopramide  hcl, prochlorperazine edisylate, and sulfa antibiotics.   Medication Reconciliation: Current Outpatient Medications on File Prior to Visit  Medication Sig   ADDERALL XR 20 MG 24 hr capsule Take 20 mg by mouth every morning.   albuterol  (PROVENTIL  HFA;VENTOLIN  HFA) 108 (90 Base) MCG/ACT inhaler Inhale 2 puffs into the lungs every 6 (six) hours as needed for wheezing or shortness of breath.    ALPRAZolam (XANAX) 0.25 MG tablet SMARTSIG:0.5 Tablet(s) By Mouth PRN   atorvastatin  (LIPITOR) 40 MG tablet TAKE 1 TABLET BY MOUTH EVERY DAY   Bacillus Coagulans-Inulin (PROBIOTIC) 1-250 BILLION-MG CAPS    cetirizine (ZYRTEC) 10 MG tablet Take by mouth.   diclofenac  Sodium (VOLTAREN ) 1 % GEL APPLY TO AFFECTED AREA AS INSTRUCTED   estradiol  (VIVELLE -DOT) 0.05 MG/24HR patch Place 1 patch (0.05 mg total) onto the skin 2 (two) times a week.   Fezolinetant  (VEOZAH ) 45 MG TABS Take 1 tablet (45 mg total) by mouth daily at 6 (six) AM.   fluconazole  (DIFLUCAN ) 100 MG tablet Take 1 tablet (100 mg total) by mouth daily.   fluconazole  (DIFLUCAN ) 150 MG tablet Take 1 tablet  (150 mg total) by mouth every three (3) days as needed.   fluticasone  (FLONASE ) 50 MCG/ACT nasal spray Place 2 sprays into both nostrils daily.   lubiprostone (AMITIZA) 24 MCG capsule TAKE ONE CAPSULE (24 MCG DOSE) BY MOUTH 2 (TWO) TIMES DAILY WITH MEALS.   magnesium  oxide (MAG-OX) 400 MG tablet Take by mouth.   MELATONIN GUMMIES PO Take 1-2 each by mouth at bedtime.   Multiple Vitamins-Minerals (EQL CENTURY WOMENS) TABS    NUCYNTA 100  MG TABS Take 1 tablet by mouth every 8 (eight) hours as needed.   omeprazole (PRILOSEC) 40 MG capsule Take 40 mg by mouth daily.   oxyCODONE  (ROXICODONE ) 5 MG immediate release tablet Take 1 tablet (5 mg total) by mouth every 4 (four) hours as needed for severe pain (pain score 7-10) or breakthrough pain.   senna-docusate (SENOKOT-S) 8.6-50 MG tablet Take 1 tablet by mouth daily.   tiZANidine  (ZANAFLEX ) 4 MG tablet Take 4 mg by mouth every 6 (six) hours as needed for muscle spasms.    traZODone (DESYREL) 50 MG tablet Take 1-2 tablets by mouth at bedtime as needed.   venlafaxine  XR (EFFEXOR -XR) 150 MG 24 hr capsule Take 2 capsules (300 mg total) by mouth daily before breakfast.   VEOZAH  45 MG TABS TAKE 1 TABLET (45 MG TOTAL) BY MOUTH DAILY AT 6 (SIX) AM.   zonisamide (ZONEGRAN) 100 MG capsule Take 100 mg by mouth at bedtime.   aspirin  EC 325 MG tablet Take 1 tablet (325 mg total) by mouth daily. (Patient not taking: Reported on 05/16/2024)   gabapentin  (NEURONTIN ) 300 MG capsule TAKE 2 CAPSULES BY MOUTH 3 TIMES DAILY (Patient taking differently: Take 600-1,200 mg by mouth See admin instructions. Take 600mg  by mouth in the morning and 1,200mg  in the evening)   promethazine  (PHENERGAN ) 25 MG tablet Take 1 tablet (25 mg total) by mouth every 8 (eight) hours as needed for nausea or vomiting. (Patient not taking: Reported on 06/17/2024)   tirzepatide  (ZEPBOUND ) 2.5 MG/0.5ML Pen Inject 2.5 mg into the skin once a week. (Patient not taking: Reported on 05/16/2024)   No current  facility-administered medications on file prior to visit.   Medications Discontinued During This Encounter  Medication Reason   pantoprazole  (PROTONIX ) 20 MG tablet    EMGALITY  120 MG/ML SOAJ Reorder   mirtazapine  (REMERON ) 30 MG tablet Reorder   ondansetron  (ZOFRAN -ODT) 8 MG disintegrating tablet Reorder     Physical Exam:    06/17/2024    1:05 PM 05/24/2024   10:38 AM 05/24/2024   10:30 AM  Vitals with BMI  Height 5' 7    Weight 252 lbs 13 oz    BMI 39.58    Systolic 132 167 857  Diastolic 82 99 86  Pulse 107 95 74  Vital signs reviewed.  Nursing notes reviewed. Weight trend reviewed. Physical Exam General Appearance:  No acute distress appreciable.   Well-groomed, healthy-appearing female.  Well proportioned with no abnormal fat distribution.  Good muscle tone. Pulmonary:  Normal work of breathing at rest, no respiratory distress apparent. SpO2: 98 %  Musculoskeletal: All extremities are intact.  Neurological:  Awake, alert, oriented, and engaged.  No obvious focal neurological deficits or cognitive impairments.  Sensorium seems unclouded.   Speech is clear and coherent with logical content. Psychiatric:  Appropriate mood, pleasant and cooperative demeanor, thoughtful and engaged during the exam Physical Exam SKIN: Erythematous and pruritic patches consistent with eczema on the elbow and lateral aspect of the nose.   Results:    06/17/2024    1:13 PM 03/08/2024   10:54 AM 06/17/2023   10:20 AM 08/12/2017    2:45 PM  PHQ 2/9 Scores  PHQ - 2 Score 1 0 2 0  PHQ- 9 Score 3 0 7 0   Results LABS   - D-dimer: elevated   - Thyroid  function test: within normal limits    No results found for any visits on 06/17/24. Office Visit on 03/02/2024  Component Date  Value Ref Range Status   D-Dimer, Quant 03/02/2024 1.69 (H)  <0.50 mcg/mL FEU Final  Admission on 02/29/2024, Discharged on 03/01/2024  Component Date Value Ref Range Status   Sodium 02/29/2024 139  135 - 145 mmol/L Final    Potassium 02/29/2024 3.5  3.5 - 5.1 mmol/L Final   Chloride 02/29/2024 103  98 - 111 mmol/L Final   CO2 02/29/2024 26  22 - 32 mmol/L Final   Glucose, Bld 02/29/2024 112 (H)  70 - 99 mg/dL Final   BUN 95/85/7974 9  6 - 20 mg/dL Final   Creatinine, Ser 02/29/2024 0.71  0.44 - 1.00 mg/dL Final   Calcium  02/29/2024 8.8 (L)  8.9 - 10.3 mg/dL Final   GFR, Estimated 02/29/2024 >60  >60 mL/min Final   Anion gap 02/29/2024 10  5 - 15 Final   WBC 02/29/2024 8.9  4.0 - 10.5 K/uL Final   RBC 02/29/2024 4.48  3.87 - 5.11 MIL/uL Final   Hemoglobin 02/29/2024 12.6  12.0 - 15.0 g/dL Final   HCT 95/85/7974 38.6  36.0 - 46.0 % Final   MCV 02/29/2024 86.2  80.0 - 100.0 fL Final   MCH 02/29/2024 28.1  26.0 - 34.0 pg Final   MCHC 02/29/2024 32.6  30.0 - 36.0 g/dL Final   RDW 95/85/7974 13.9  11.5 - 15.5 % Final   Platelets 02/29/2024 313  150 - 400 K/uL Final   nRBC 02/29/2024 0.0  0.0 - 0.2 % Final   Troponin I (High Sensitivity) 02/29/2024 3  <18 ng/L Final   TSH 02/29/2024 1.090  0.350 - 4.500 uIU/mL Final  Office Visit on 02/02/2024  Component Date Value Ref Range Status   Albumin 02/02/2024 4.1  3.9 - 4.9 g/dL Final   Dehydroepiandrosterone 02/02/2024 38  31 - 701 ng/dL Final   LH 96/81/7974 65.0  mIU/mL Final   FSH 02/02/2024 24.3  mIU/mL Final   Prolactin 02/02/2024 16.9  4.8 - 33.4 ng/mL Final   Progesterone  02/02/2024 0.3  ng/mL Final   Estrogen 02/02/2024 276  pg/mL Final   Sex Hormone Binding 02/02/2024 31.8  24.6 - 122.0 nmol/L Final   Vit D, 25-Hydroxy 02/02/2024 44.8  30.0 - 100.0 ng/mL Final  Office Visit on 09/09/2023  Component Date Value Ref Range Status   Rapid Strep A Screen 09/09/2023 Positive (A)  Negative Final   Influenza A, POC 09/09/2023 Negative  Negative Final   Influenza B, POC 09/09/2023 Negative  Negative Final   SARS Coronavirus 2 Ag 09/09/2023 Negative  Negative Final  Office Visit on 07/13/2023  Component Date Value Ref Range Status   Hemoglobin A1C 07/13/2023  5.8 (A)  4.0 - 5.6 % Final  Admission on 06/20/2023, Discharged on 06/20/2023  Component Date Value Ref Range Status   Lipase 06/20/2023 26  11 - 51 U/L Final   Sodium 06/20/2023 138  135 - 145 mmol/L Final   Potassium 06/20/2023 3.7  3.5 - 5.1 mmol/L Final   Chloride 06/20/2023 103  98 - 111 mmol/L Final   CO2 06/20/2023 27  22 - 32 mmol/L Final   Glucose, Bld 06/20/2023 119 (H)  70 - 99 mg/dL Final   BUN 91/96/7975 10  6 - 20 mg/dL Final   Creatinine, Ser 06/20/2023 0.75  0.44 - 1.00 mg/dL Final   Calcium  06/20/2023 9.4  8.9 - 10.3 mg/dL Final   Total Protein 91/96/7975 7.3  6.5 - 8.1 g/dL Final   Albumin 91/96/7975 4.0  3.5 - 5.0 g/dL Final  AST 06/20/2023 28  15 - 41 U/L Final   ALT 06/20/2023 23  0 - 44 U/L Final   Alkaline Phosphatase 06/20/2023 108  38 - 126 U/L Final   Total Bilirubin 06/20/2023 0.3  0.3 - 1.2 mg/dL Final   GFR, Estimated 06/20/2023 >60  >60 mL/min Final   Anion gap 06/20/2023 8  5 - 15 Final   WBC 06/20/2023 4.8  4.0 - 10.5 K/uL Final   RBC 06/20/2023 4.20  3.87 - 5.11 MIL/uL Final   Hemoglobin 06/20/2023 11.6 (L)  12.0 - 15.0 g/dL Final   HCT 91/96/7975 35.3 (L)  36.0 - 46.0 % Final   MCV 06/20/2023 84.0  80.0 - 100.0 fL Final   MCH 06/20/2023 27.6  26.0 - 34.0 pg Final   MCHC 06/20/2023 32.9  30.0 - 36.0 g/dL Final   RDW 91/96/7975 13.8  11.5 - 15.5 % Final   Platelets 06/20/2023 263  150 - 400 K/uL Final   nRBC 06/20/2023 0.0  0.0 - 0.2 % Final   Color, Urine 06/20/2023 YELLOW  YELLOW Final   APPearance 06/20/2023 CLEAR  CLEAR Final   Specific Gravity, Urine 06/20/2023 >1.046 (H)  1.005 - 1.030 Final   pH 06/20/2023 7.5  5.0 - 8.0 Final   Glucose, UA 06/20/2023 NEGATIVE  NEGATIVE mg/dL Final   Hgb urine dipstick 06/20/2023 NEGATIVE  NEGATIVE Final   Bilirubin Urine 06/20/2023 NEGATIVE  NEGATIVE Final   Ketones, ur 06/20/2023 NEGATIVE  NEGATIVE mg/dL Final   Protein, ur 91/96/7975 TRACE (A)  NEGATIVE mg/dL Final   Nitrite 91/96/7975 NEGATIVE   NEGATIVE Final   Leukocytes,Ua 06/20/2023 NEGATIVE  NEGATIVE Final  Office Visit on 06/17/2023  Component Date Value Ref Range Status   Hepatitis C Ab 06/17/2023 NON-REACTIVE  NON-REACTIVE Final   WBC 06/17/2023 6.0  4.0 - 10.5 K/uL Final   RBC 06/17/2023 4.50  3.87 - 5.11 Mil/uL Final   Platelets 06/17/2023 300.0  150.0 - 400.0 K/uL Final   Hemoglobin 06/17/2023 12.3  12.0 - 15.0 g/dL Final   HCT 92/68/7975 37.6  36.0 - 46.0 % Final   MCV 06/17/2023 83.7  78.0 - 100.0 fl Final   MCHC 06/17/2023 32.6  30.0 - 36.0 g/dL Final   RDW 92/68/7975 14.1  11.5 - 15.5 % Final   Sodium 06/17/2023 138  135 - 145 mEq/L Final   Potassium 06/17/2023 4.3  3.5 - 5.1 mEq/L Final   Chloride 06/17/2023 104  96 - 112 mEq/L Final   CO2 06/17/2023 26  19 - 32 mEq/L Final   Glucose, Bld 06/17/2023 89  70 - 99 mg/dL Final   BUN 92/68/7975 13  6 - 23 mg/dL Final   Creatinine, Ser 06/17/2023 0.84  0.40 - 1.20 mg/dL Final   Total Bilirubin 06/17/2023 0.3  0.2 - 1.2 mg/dL Final   Alkaline Phosphatase 06/17/2023 119 (H)  39 - 117 U/L Final   AST 06/17/2023 30  0 - 37 U/L Final   ALT 06/17/2023 27  0 - 35 U/L Final   Total Protein 06/17/2023 7.8  6.0 - 8.3 g/dL Final   Albumin 92/68/7975 4.3  3.5 - 5.2 g/dL Final   GFR 92/68/7975 86.68  >60.00 mL/min Final   Calcium  06/17/2023 9.4  8.4 - 10.5 mg/dL Final   Cholesterol 92/68/7975 157  0 - 200 mg/dL Final   Triglycerides 92/68/7975 184.0 (H)  0.0 - 149.0 mg/dL Final   HDL 92/68/7975 37.20 (L)  >60.99 mg/dL Final   VLDL 92/68/7975  36.8  0.0 - 40.0 mg/dL Final   LDL Cholesterol 06/17/2023 83  0 - 99 mg/dL Final   Total CHOL/HDL Ratio 06/17/2023 4   Final   NonHDL 06/17/2023 120.15   Final   HIV 1&2 Ab, 4th Generation 06/17/2023 NON-REACTIVE  NON-REACTIVE Final   TSH 06/17/2023 1.62  0.35 - 5.50 uIU/mL Final  Admission on 09/01/2022, Discharged on 09/01/2022  Component Date Value Ref Range Status   SARS Coronavirus 2 by RT PCR 09/01/2022 NEGATIVE  NEGATIVE Final    Influenza A by PCR 09/01/2022 POSITIVE (A)  NEGATIVE Final   Influenza B by PCR 09/01/2022 NEGATIVE  NEGATIVE Final   Group A Strep by PCR 09/01/2022 NOT DETECTED  NOT DETECTED Final  No image results found. No results found.       ASSESSMENT & PLAN   Assessment & Plan Weight disorder  Knee pain, right, chronic Knee pain status post multiple surgeries including cartilage transplant and avascular necrosis   Knee pain persists post multiple surgeries, including cartilage transplant. Current knee brace use impacts mobility. Encourage weight loss to aid recovery and reduce further cartilage wear. Chronic migraine without aura without status migrainosus, not intractable Migraine managed with Emgality  and magnesium  monitoring   Chronic migraine is effectively managed with Emgality . Magnesium  levels require regular monitoring due to potential depletion from Emgality  use. Prescribe Emgality  120 mg/ml injection every 30 days with a three-month supply and four refills. Discontinue triple magnesium  oral supplement and consider magnesium  cream if needed. 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 Her management is further complicated by a significant family history of breast and ovarian cancer. She has previously failed or not tolerated gabapentin , venlafaxine , fezolinetant , and transdermal estradiol , and is currently trialing oral micronized progesterone  (Prometrium ). There is no history of active malignancy, kidney or liver dysfunction, or contraindications to progesterone . Evidence-Based Review and Next Steps: Alternative Nonhormonal Pharmacologic Options Consider a trial of SSRIs/SNRIs not yet attempted, such  as paroxetine  (7.5 mg daily, FDA-approved for VMS), escitalopram, citalopram, or desvenlafaxine. These agents have demonstrated 40-65% reduction in hot flash frequency in randomized trials. Monitor for potential side effects, including sexual dysfunction and GI upset. Oxybutynin  (2.5-5 mg BID) is another option, shown to reduce VMS by up to 50%, but use with caution due to anticholinergic side effects (dry mouth, constipation, cognitive changes). Pregabalin may be considered if gabapentin  was ineffective or not tolerated, though side effects (drowsiness, dizziness, weight gain) are common. Clonidine  is rarely used due to limited efficacy and risk of hypotension. Fezolinetant  was previously trialed and is not indicated to retry. Behavioral and Integrative Therapies Recommend referral for cognitive behavioral therapy (CBT) or clinical hypnosis, which have the strongest evidence among nonpharmacologic interventions for reducing VMS-related distress and improving quality of life, though they do not reduce hot flash frequency. Advise continued lifestyle modifications (cool environment, layered clothing, trigger avoidance), recognizing that these offer only modest benefit for severe symptoms. Discourage use of supplements, herbal remedies, acupuncture, yoga, or mindfulness for VMS, as they have not shown efficacy beyond placebo in high-quality trials. Specialty Referral and Additional Workup Refer to a menopause specialist for multidisciplinary management of refractory VMS. Consider additional evaluation for alternative causes of vasomotor symptoms (e.g., thyroid  dysfunction, pheochromocytoma) if clinically indicated. Hormone Therapy Considerations Estrogen-alone therapy is the most effective treatment for  VMS and generally considered safe after hysterectomy; however, caution is warranted in women with a strong family history or known genetic predisposition to breast/ovarian cancer. Progesterone -only regimens  are not recommended for high-risk women due to potential cancer risk. Shared decision-making and individualized risk assessment are essential - we will include a specialist and worked on this some today Other Interventions In highly refractory cases, stellate ganglion block may be considered, though evidence is limited and it is not yet standard of care. Plan: Initiate a trial of an SSRI/SNRI not previously attempted (e.g., paroxetine  7.5 mg daily, escitalopram, citalopram, or desvenlafaxine), monitoring for side effects. If inadequate response, consider oral oxybutynin  if not previously tried and no contraindications. Refer for cognitive behavioral therapy or clinical hypnosis. Place referral to menopause/endocrinology specialist for further evaluation and management. Reassess for alternative causes of vasomotor symptoms as clinically indicated. Continue shared decision-making regarding hormone therapy options, with specialist input as needed. Discuss stellate ganglion block only if all other options are exhausted and symptoms remain debilitating.  Obesity (BMI 30-39.9) Obesity with weight management plan including tirzepatide    Obesity with a BMI of 30-39.9 is addressed with a weight management plan. Initiate tirzepatide  at $200 per month from a [facility] pharmacy to aid weight loss and improve knee recovery. Emphasize avoiding sugary drinks to prevent muscle loss during weight loss, limiting Dr. Nunzio intake to one per day with a meal. Flexural eczema Eczema on the elbow and nose causes itching and irritation. Prescribe triamcinolone  cream for affected areas, with caution on facial use. Limit use to 14 days. Depressive disorder Try Effexor ->  paxil  FH: breast cancer Refer care to genetics    ORDER ASSOCIATIONS  #   DIAGNOSIS / CONDITION ICD-10 ENCOUNTER ORDER     ICD-10-CM   1. Weight disorder  R63.8     2. Knee pain, right, chronic  M25.561    G89.29     3. Chronic migraine without  aura without status migrainosus, not intractable  G43.709 EMGALITY  120 MG/ML SOAJ    4. Menopausal syndrome (hot flushes)  N95.1 progesterone  (PROMETRIUM ) 100 MG capsule    Ambulatory referral to Gynecology    PARoxetine  (PAXIL ) 10 MG tablet    5. Obesity (BMI 30-39.9)  E66.9     6. Nausea  R11.0 ondansetron  (ZOFRAN -ODT) 8 MG disintegrating tablet    7. Insomnia, unspecified type  G47.00 mirtazapine  (REMERON ) 15 MG tablet    8. Flexural eczema  L20.82 triamcinolone  cream (KENALOG ) 0.1 %    9. Depressive disorder  F32.A     10. FH: breast cancer  Z80.3       Meds ordered this encounter  Medications   EMGALITY  120 MG/ML SOAJ    Sig: Inject 120 mg into the skin every 30 (thirty) days.    Dispense:  3 mL    Refill:  4   mirtazapine  (REMERON ) 15 MG tablet    Sig: Take 1 tablet (15 mg total) by mouth daily.    Dispense:  90 tablet    Refill:  3   ondansetron  (ZOFRAN -ODT) 8 MG disintegrating tablet    Sig: Take 1 tablet (8 mg total) by mouth every 8 (eight) hours as needed.    Dispense:  60 tablet    Refill:  11   triamcinolone  cream (KENALOG ) 0.1 %    Sig: Apply 1 Application topically 2 (two) times daily.    Dispense:  30 g    Refill:  0   progesterone  (PROMETRIUM ) 100 MG capsule  Sig: Take 3 capsules (300 mg total) by mouth daily.    Dispense:  270 capsule    Refill:  4   PARoxetine  (PAXIL ) 10 MG tablet    Sig: Take 1 tablet (10 mg total) by mouth daily. Replaces Effexor , for hot flushes, switching out will be not feeling good for a few days    Dispense:  90 tablet    Refill:  4       This document was synthesized by artificial intelligence ([employer]) using HIPAA-compliant recording of the clinical interaction;   We discussed the use of AI scribe software for clinical note transcription with the patient, who gave verbal consent to proceed. additional Info: This encounter employed state-of-the-art, real-time, collaborative documentation. The patient actively reviewed and  assisted in updating their electronic medical record on a shared screen, ensuring transparency and facilitating joint problem-solving for the problem list, overview, and plan. This approach promotes accurate, informed care. The treatment plan was discussed and reviewed in detail, including medication safety, potential side effects, and all patient questions. We confirmed understanding and comfort with the plan. Follow-up instructions were established, including contacting the office for any concerns, returning if symptoms worsen, persist, or new symptoms develop, and precautions for potential emergency department visits.

## 2024-06-17 NOTE — Patient Instructions (Addendum)
 It was a pleasure seeing you today! Your health and satisfaction are our top priorities.  Bernardino Cone, MD  VISIT SUMMARY: During your visit, we discussed several ongoing health concerns, including your knee pain, migraine management, menopausal symptoms, weight management, depression, and eczema. We reviewed your current treatments and made some adjustments to better address your symptoms and improve your overall health.  YOUR PLAN: -MIGRAINE: Your chronic migraines are well-managed with Emgality . We will continue with your current dose of Emgality  120 mg monthly. It's important to monitor your magnesium  levels regularly, as Emgality  can deplete magnesium . You can stop taking the triple magnesium  oral supplement and consider using magnesium  cream if needed.  -OBESITY: Your weight management plan includes starting tirzepatide , which will help with weight loss and improve your knee recovery. It's crucial to avoid sugary drinks to prevent muscle loss during weight loss. Try to limit your Dr. Nunzio intake to one per day with a meal.  -MENOPAUSAL SYNDROME: Your severe hot flashes are not well controlled with your current treatment. Given your family history, we discussed the risks and benefits of hormone therapy. We will discontinue the estradiol  patch and start you on Prometrium  (micronized progesterone ) 300 mg nightly, which is safer for you. Avoid DHEA supplements.  -KNEE PAIN: Your knee pain continues after multiple surgeries. Using a knee brace impacts your mobility. Weight loss will help with your recovery and reduce further cartilage wear.  -MAJOR DEPRESSIVE DISORDER: Your depression is managed with mirtazapine , but it may contribute to weight gain. Continue taking mirtazapine  15 mg at bedtime, and you can reduce the dose to 7.5 mg if needed to lessen this side effect.  -ECZEMA: Your eczema on the elbow and nose is causing itching and irritation. We will prescribe triamcinolone  cream for the  affected areas, but be cautious when using it on your face. Limit the use of this cream to 14 days.  INSTRUCTIONS: Please follow up with regular monitoring of your magnesium  levels. Continue with your weight management plan and limit sugary drinks. Start taking Prometrium  300 mg nightly and discontinue the estradiol  patch. Use triamcinolone  cream for eczema as directed. If you have any concerns or experience any side effects, please contact our office.  Your Providers PCP: Cone Bernardino MATSU, MD,  [phone]) Referring Provider: Cone Bernardino MATSU, MD,  [phone]) Care Team Provider: Gorge Ade, MD,  [phone]) Care Team Provider: Domenica Reusing, MD,  [phone])  NEXT STEPS: [x]  Early Intervention: Schedule sooner appointment, call our on-call services, or go to emergency room if there is any significant Increase in pain or discomfort New or worsening symptoms Sudden or severe changes in your health [x]  Flexible Follow-Up: We recommend a No follow-ups on file. for optimal routine care. This allows for progress monitoring and treatment adjustments. [x]  Preventive Care: Schedule your annual preventive care visit! It's typically covered by insurance and helps identify potential health issues early. [x]  Lab & X-ray Appointments: Incomplete tests scheduled today, or call to schedule. X-rays:  [facility] (M-F, 8:30am-noon or 1pm-5pm). [x]  Medical Information Release: Sign a release form at front desk to obtain relevant medical information we don't have.  MAKING THE MOST OF OUR FOCUSED 20 MINUTE APPOINTMENTS: [x]   Clearly state your top concerns at the beginning of the visit to focus our discussion [x]   If you anticipate you will need more time, please inform the front desk during scheduling - we can book multiple appointments in the same week. [x]   If you have transportation problems- use  our convenient video appointments or ask about transportation support. [x]    We can get down to business faster if you use MyChart to update information before the visit and submit non-urgent questions before your visit. Thank you for taking the time to provide details through MyChart.  Let our nurse know and she can import this information into your encounter documents.  Arrival and Wait Times: [x]   Arriving on time ensures that everyone receives prompt attention. [x]   Early morning (8a) and afternoon (1p) appointments tend to have shortest wait times. [x]   Unfortunately, we cannot delay appointments for late arrivals or hold slots during phone calls.  Getting Answers and Following Up [x]   Simple Questions & Concerns: For quick questions or basic follow-up after your visit, reach us  at [phone] or MyChart messaging. [x]   Complex Concerns: If your concern is more complex, scheduling an appointment might be best. Discuss this with the staff to find the most suitable option. [x]   Lab & Imaging Results: We'll contact you directly if results are abnormal or you don't use MyChart. Most normal results will be on MyChart within 2-3 business days, with a review message from Dr. Jesus. Haven't heard back in 2 weeks? Need results sooner? Contact us  at [phone]. [x]   Referrals: Our referral coordinator will manage specialist referrals. The specialist's office should contact you within 2 weeks to schedule an appointment. Call us  if you haven't heard from them after 2 weeks.  Staying Connected [x]   MyChart: Activate your MyChart for the fastest way to access results and message us . See the last page of this paperwork for instructions on how to activate.  Bring to Your Next Appointment [x]   Medications: Please bring all your medication bottles to your next appointment to ensure we have an accurate record of your prescriptions. [x]   Health Diaries: If you're monitoring any health conditions at home, keeping a diary of your readings can be very helpful for discussions at  your next appointment.  Billing [x]   X-ray & Lab Orders: These are billed by separate companies. Contact the invoicing company directly for questions or concerns. [x]   Visit Charges: Discuss any billing inquiries with our administrative services team.  Your Satisfaction Matters [x]   Share Your Experience: We strive for your satisfaction! If you have any complaints, or preferably compliments, please let Dr. Jesus know directly or contact our Practice Administrators, Manuelita Rubin or [employer], by asking at the front desk.   Reviewing Your Records [x]   Review this early draft of your clinical encounter notes below and the final encounter summary tomorrow on MyChart after its been completed.  All orders placed so far are visible here: Weight disorder  Knee pain, right, chronic  Chronic migraine without aura without status migrainosus, not intractable -     Emgality ; Inject 120 mg into the skin every 30 (thirty) days.  Dispense: 3 mL; Refill: 4  Menopausal syndrome (hot flushes) -     Progesterone ; Take 3 capsules (300 mg total) by mouth daily.  Dispense: 270 capsule; Refill: 4  Obesity (BMI 30-39.9)  Nausea -     Ondansetron ; Take 1 tablet (8 mg total) by mouth every 8 (eight) hours as needed.  Dispense: 60 tablet; Refill: 11  Insomnia, unspecified type -     Mirtazapine ; Take 1 tablet (15 mg total) by mouth daily.  Dispense: 90 tablet; Refill: 3  Flexural eczema -     Triamcinolone  Acetonide; Apply 1 Application topically 2 (two) times daily.  Dispense:  30 g; Refill: 0

## 2024-06-19 ENCOUNTER — Encounter: Payer: Self-pay | Admitting: Internal Medicine

## 2024-06-19 DIAGNOSIS — G4700 Insomnia, unspecified: Secondary | ICD-10-CM | POA: Insufficient documentation

## 2024-06-19 DIAGNOSIS — L2082 Flexural eczema: Secondary | ICD-10-CM | POA: Insufficient documentation

## 2024-06-19 DIAGNOSIS — Z803 Family history of malignant neoplasm of breast: Secondary | ICD-10-CM | POA: Insufficient documentation

## 2024-06-19 MED ORDER — PAROXETINE HCL 10 MG PO TABS: 10.0000 mg | ORAL_TABLET | Freq: Every day | ORAL | 4 refills | Status: DC

## 2024-06-19 NOTE — Assessment & Plan Note (Addendum)
 Obesity with weight management plan including tirzepatide    Obesity with a BMI of 30-39.9 is addressed with a weight management plan. Initiate tirzepatide  at $200 per month from a [facility] pharmacy to aid weight loss and improve knee recovery. Emphasize avoiding sugary drinks to prevent muscle loss during weight loss, limiting Dr. Nunzio intake to one per day with a meal.

## 2024-06-19 NOTE — Assessment & Plan Note (Signed)
 Refer care to genetics

## 2024-06-19 NOTE — Assessment & Plan Note (Signed)
 Try Effexor ->  paxil 

## 2024-06-19 NOTE — Assessment & Plan Note (Signed)
 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 Current knee brace use impacts mobility. Encourage weight loss to aid recovery and reduce further cartilage wear.

## 2024-06-19 NOTE — Assessment & Plan Note (Signed)
 Eczema on the elbow and nose causes itching and irritation. Prescribe triamcinolone  cream for affected areas, with caution on facial use. Limit use to 14 days.

## 2024-06-19 NOTE — Assessment & Plan Note (Signed)
 Migraine managed with Emgality  and magnesium  monitoring   Chronic migraine is effectively managed with Emgality . Magnesium  levels require regular monitoring due to potential depletion from Emgality  use. Prescribe Emgality  120 mg/ml injection every 30 days with a three-month supply and four refills. Discontinue triple magnesium  oral supplement and consider magnesium  cream if needed.

## 2024-06-19 NOTE — Assessment & Plan Note (Signed)
 Major depressive disorder is managed with mirtazapine , which may contribute to weight gain. Prescribe mirtazapine  15 mg oral at bedtime, allowing for dose reduction to 7.5 mg if needed to mitigate this side effect.

## 2024-06-19 NOTE — Assessment & Plan Note (Addendum)
 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 Her management is further complicated by a significant family history of breast and ovarian cancer. She has previously failed or not tolerated gabapentin , venlafaxine , fezolinetant , and transdermal estradiol , and is currently trialing oral micronized progesterone  (Prometrium ). There is no history of active malignancy, kidney or liver dysfunction, or contraindications to progesterone . Evidence-Based Review and Next Steps: Alternative Nonhormonal Pharmacologic Options Consider a trial of SSRIs/SNRIs not yet attempted, such as paroxetine  (7.5 mg daily, FDA-approved for VMS), escitalopram, citalopram, or desvenlafaxine. These agents have demonstrated 40-65% reduction in hot flash frequency in randomized trials. Monitor for potential side effects, including sexual dysfunction and GI upset. Oxybutynin  (2.5-5 mg BID) is another option, shown to reduce VMS by up to 50%, but use with caution due to anticholinergic side effects (dry mouth, constipation, cognitive changes). Pregabalin may be considered if gabapentin  was ineffective or not tolerated, though side effects (drowsiness, dizziness, weight gain) are common. Clonidine  is rarely used due to limited efficacy and risk of hypotension. Fezolinetant  was previously trialed and is not indicated to retry. Behavioral and Integrative Therapies Recommend referral for cognitive behavioral therapy (CBT) or clinical hypnosis, which have the strongest evidence among nonpharmacologic interventions for reducing VMS-related distress and improving quality of life, though they do not reduce hot flash frequency. Advise continued lifestyle modifications (cool environment, layered  clothing, trigger avoidance), recognizing that these offer only modest benefit for severe symptoms. Discourage use of supplements, herbal remedies, acupuncture, yoga, or mindfulness for VMS, as they have not shown efficacy beyond placebo in high-quality trials. Specialty Referral and Additional Workup Refer to a menopause specialist for multidisciplinary management of refractory VMS. Consider additional evaluation for alternative causes of vasomotor symptoms (e.g., thyroid  dysfunction, pheochromocytoma) if clinically indicated. Hormone Therapy Considerations Estrogen-alone therapy is the most effective treatment for VMS and generally considered safe after hysterectomy; however, caution is warranted in women with a strong family history or known genetic predisposition to breast/ovarian cancer. Progesterone -only regimens are not recommended for high-risk women due to potential cancer risk. Shared decision-making and individualized risk assessment are essential - we will include a specialist and worked on this some today Other Interventions In highly refractory cases, stellate ganglion block may be considered, though evidence is limited and it is not yet standard of care. Plan: Initiate a trial of an SSRI/SNRI not previously attempted (e.g., paroxetine  7.5 mg daily, escitalopram, citalopram, or desvenlafaxine), monitoring for side effects. If inadequate response, consider oral oxybutynin  if not previously tried and no contraindications. Refer for cognitive behavioral therapy or clinical hypnosis. Place referral to menopause/endocrinology specialist for further evaluation and management. Reassess for alternative causes of vasomotor symptoms as clinically indicated. Continue shared decision-making regarding hormone therapy options, with specialist input as needed. Discuss stellate ganglion block only if all other options are exhausted and symptoms remain debilitating.

## 2024-06-19 NOTE — Assessment & Plan Note (Addendum)
 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 Current knee brace use impacts mobility. Encourage weight loss to aid recovery and reduce further cartilage wear.

## 2024-06-20 ENCOUNTER — Encounter (HOSPITAL_BASED_OUTPATIENT_CLINIC_OR_DEPARTMENT_OTHER): Payer: Self-pay | Admitting: Orthopaedic Surgery

## 2024-06-20 ENCOUNTER — Telehealth (HOSPITAL_BASED_OUTPATIENT_CLINIC_OR_DEPARTMENT_OTHER): Payer: Self-pay | Admitting: Orthopaedic Surgery

## 2024-06-20 ENCOUNTER — Ambulatory Visit (HOSPITAL_BASED_OUTPATIENT_CLINIC_OR_DEPARTMENT_OTHER): Attending: Orthopaedic Surgery | Admitting: Physical Therapy

## 2024-06-20 ENCOUNTER — Encounter (HOSPITAL_BASED_OUTPATIENT_CLINIC_OR_DEPARTMENT_OTHER): Payer: Self-pay | Admitting: Physical Therapy

## 2024-06-20 DIAGNOSIS — M6281 Muscle weakness (generalized): Secondary | ICD-10-CM | POA: Diagnosis present

## 2024-06-20 DIAGNOSIS — M25562 Pain in left knee: Secondary | ICD-10-CM | POA: Diagnosis present

## 2024-06-20 DIAGNOSIS — R2689 Other abnormalities of gait and mobility: Secondary | ICD-10-CM | POA: Diagnosis present

## 2024-06-20 DIAGNOSIS — R600 Localized edema: Secondary | ICD-10-CM | POA: Insufficient documentation

## 2024-06-20 DIAGNOSIS — R262 Difficulty in walking, not elsewhere classified: Secondary | ICD-10-CM | POA: Insufficient documentation

## 2024-06-20 NOTE — Therapy (Signed)
 OUTPATIENT PHYSICAL THERAPY LOWER EXTREMITY TREATMENT   Patient Name: Diane [name] MRN: [id number] DOB:Apr 02, 1981, 43 y.o., female Today's Date: 06/20/2024  END OF SESSION:  PT End of Session - 06/20/24 1222     Visit Number 4    Number of Visits 21    Date for PT Re-Evaluation 07/12/24    Authorization Type [employer] 20 VL    PT Start Time 1155    PT Stop Time 1228    PT Time Calculation (min) 33 min    Equipment Utilized During Treatment Left knee immobilizer    Activity Tolerance Patient tolerated treatment well    Behavior During Therapy WFL for tasks assessed/performed              Past Medical History:  Diagnosis Date   Acute injury of left knee cartilage 05/24/2024   ADD (attention deficit disorder with hyperactivity)    Allergy     Anxiety    Arthritis    Atypical chest pain 07/26/2021   Atypical chest pain     Benign intracranial hypertension 06/16/2008   Qualifier: Diagnosis of   By: Donzell Smolder         Choking    due to food and pills get stuck   Cholelithiasis and acute cholecystitis without obstruction 12/02/2016   Chronic cough 12/27/2013   Depression    Dry mouth 08/12/2017   Dysmenorrhea    Family history of adverse reaction to anesthesia    pt's father has hx. of being hard to wake up post-op   Gastric ulcer 03/16/2015   GERD (gastroesophageal reflux disease)    Headache 07/09/2007   Qualifier: Diagnosis of   By: Tammie MD, Bruce      Replacing diagnoses that were inactivated after the 02/16/23 regulatory import     History of gastric ulcer    History of kidney stones    Hyperlipidemia    IBS (irritable bowel syndrome)    Lower extremity edema 07/26/2021   Lower extremity edema     Migraines    neurologist-  dr c. hagen ([facility] in [place])-- treated with Emgality  injection every 30 days   Mild obstructive sleep apnea    per study in epic 03-21-2017 mild osa, recommendation cpap, mouth appliance, wt loss   Nausea and  vomiting 12/03/2016   OAB (overactive bladder) 08/12/2017   Osteoarthritis    right ankle   Osteochondral lesion 09/2016   right ankle   Osteomyelitis of knee region [facility])    Peripheral vascular disease (HCC)    NERVE DAMAGE RIGHT LEG AND FOOT DUE TO INJURGY.    Plantar fasciitis of left foot    Pleural effusion 12/15/2013   PONV (postoperative nausea and vomiting)    AND HEADACHES   Pseudotumor cerebri    at back head   RUQ pain    S/P dilatation of esophageal stricture 07/2018   Sinusitis 08/12/2018   RESOLVED WITH ANTIOBIOTIC   SUI (stress urinary incontinence, female)    Wears contact lenses    Wears partial dentures    upper   Past Surgical History:  Procedure Laterality Date   ANKLE ARTHROSCOPY Right 10/22/2016   Procedure: ANKLE ARTHROSCOPY;  Surgeon: Donnice JONELLE Fees, DPM;  Location: [facility];  Service: Podiatry;  Laterality: Right;  GENERAL/REG BLOCK   ANKLE ARTHROSCOPY Right 09-15-2017   @[facility]    BLADDER SURGERY  child   x 2 - as a child to stretch bladder   CESAREAN SECTION  12/24/2009  CESAREAN SECTION  11/20/2011   Procedure: CESAREAN SECTION;  Surgeon: Charlie JINNY Flowers, MD;  Location: [facility];  Service: Gynecology;  Laterality: N/A;   CHOLECYSTECTOMY N/A 12/03/2016   Procedure: LAPAROSCOPIC CHOLECYSTECTOMY WITH INTRAOPERATIVE CHOLANGIOGRAM;  Surgeon: Krystal Spinner, MD;  Location: [facility];  Service: General;  Laterality: N/A;   CHONDROPLASTY Right 01/12/2024   Procedure: RIGHT KNEE ARTHROSCOPY LATERAL FEMORAL CHONDROPLASTY;  Surgeon: Genelle Standing, MD;  Location: [facility];  Service: Orthopedics;  Laterality: Right;   CYSTOSCOPY WITH RETROGRADE PYELOGRAM, URETEROSCOPY AND STENT PLACEMENT Left 08/10/2006   ESOPHAGOGASTRODUODENOSCOPY (EGD) WITH ESOPHAGEAL DILATION  07/2018   HARDWARE REMOVAL Right 07/01/2016   Procedure: HARDWARE REMOVAL;  Surgeon: Glendia Cordella Hutchinson, MD;  Location: [facility];  Service: Orthopedics;  Laterality: Right;    HARVEST BONE GRAFT Right 01/12/2024   Procedure: RIGHT BONE MARROW ASPIRATE FROM ILIAC CREST DECOMPRESSION AND PLACEMENT RIGHT LATERAL FEMORAL CONDYLE;  Surgeon: Genelle Standing, MD;  Location: [facility];  Service: Orthopedics;  Laterality: Right;   IMPLANTATION, MATRIX-INDUCED AUTOLOGOUS CHONDROCYTES Left 05/24/2024   Procedure: IMPLANTATION, MATRIX-INDUCED AUTOLOGOUS CHONDROCYTES;  Surgeon: Genelle Standing, MD;  Location: [facility];  Service: Orthopedics;  Laterality: Left;  LEFT KNEE MATRIX ASSOCIATED CHONDROCYTE IMPLANTATION   KNEE ARTHROSCOPY Left 05/24/2024   Procedure: ARTHROSCOPY, KNEE;  Surgeon: Genelle Standing, MD;  Location: [facility];  Service: Orthopedics;  Laterality: Left;   KNEE ARTHROSCOPY WITH LATERAL RELEASE Right 12/13/2014   Procedure: KNEE ARTHROSCOPY WITH LATERAL RELEASE;  Surgeon: LELON JONETTA Shari Mickey., MD;  Location: [facility];  Service: Orthopedics;  Laterality: Right;   LAPAROSCOPIC APPENDECTOMY N/A 06/11/2014   Procedure: APPENDECTOMY LAPAROSCOPIC;  Surgeon: Krystal CHRISTELLA Spinner, MD;  Location: [facility];  Service: General;  Laterality: N/A;   ORIF ANKLE FRACTURE Right 01/05/2016   Procedure: OPEN REDUCTION INTERNAL FIXATION (ORIF) ANKLE FRACTURE;  Surgeon: Glendia Cordella Hutchinson, MD;  Location: [facility];  Service: Orthopedics;  Laterality: Right;   OVARIAN CYST SURGERY Right 03/05/2007   AND CHROMOPERTUBATION  VIA LAPAROSCOPY   ROBOTIC ASSISTED LAPAROSCOPIC HYSTERECTOMY AND SALPINGECTOMY Bilateral 08/27/2018   Procedure: XI ROBOTIC ASSISTED LAPAROSCOPIC HYSTERECTOMY AND BILATERAL SALPINGECTOMY;  Surgeon: Flowers Charlie, MD;  Location: [facility];  Service: Gynecology;  Laterality: Bilateral;  [facility] for 23hr OBS   SYNOVECTOMY Right 12/13/2014   Procedure: PLICA SYNOVECTOMY;  Surgeon: LELON JONETTA Shari Mickey., MD;  Location:  [facility];  Service: Orthopedics;  Laterality: Right;   TONSILLECTOMY AND ADENOIDECTOMY  child   WISDOM TOOTH  EXTRACTION     Patient Active Problem List   Diagnosis Date Noted   Flexural eczema 06/19/2024   Insomnia 06/19/2024   FH: breast cancer 06/19/2024   Chondromalacia of left patella 05/24/2024   Peripheral tear of lateral meniscus of left knee as current injury 05/24/2024   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 01/12/2024   AVN (avascular necrosis of bone) (HCC) 01/12/2024   Prediabetes 07/14/2023   Ankle swelling 07/14/2023   Menopausal syndrome (hot flushes) 07/13/2023   Hyperlipidemia 06/19/2023   Intractable pain 06/17/2023   Chronic constipation 06/17/2023   Other idiopathic scoliosis, lumbar region 06/17/2023   Dysphagia 08/12/2017   Osteochondral lesion 06/03/2017   OSA (obstructive sleep apnea) 03/24/2017   Ankle pain, chronic 01/05/2016   Knee pain, right, chronic 04/12/2015   Chronic migraine without aura 04/01/2013   Depressive disorder 10/02/2008   Opioid use disorder in remission 10/02/2008   back pain, chronic 03/28/2008   Obesity (BMI 30-39.9)  01/25/2008   Anxiety state 11/09/2007    PCP: Jesus Bernardino MATSU, MD   REFERRING PROVIDER: Genelle Standing, MD   REFERRING DIAG:  [phone] (ICD-10-CM) - Peripheral tear of lateral meniscus of left knee as current injury, initial encounter      THERAPY DIAG:  Left knee pain, unspecified chronicity  Muscle weakness (generalized)  Difficulty in walking, not elsewhere classified  Localized edema  Rationale for Evaluation and Treatment: Rehabilitation  ONSET DATE: 04/05/2024 DOS for meniscal repair  05/24/2024 MACI procedure   PROCEDURE: 1. Left knee lateral meniscal repair with cartilage biopsy   SUBJECTIVE:   PROCEDURE: 1.  Left knee lateral meniscal repair Left MACI repair    SUBJECTIVE:    SUBJECTIVE STATEMENT:  Pt states that she was about to step into the shower this morning, and fell. She reports being in a lot of pain. She fell backwards into her tub, hitting her back and legs.   Pt states that  she has been having a ton of pain in her knee where her surgical site is. She is having pain with standing, walking and moving the wrong way.     Pt is 2 weeks and 1 day s/p MACI procedure.  Pt saw MD earlier today and he states it looked good.  Pt states MD informed her she would need to use the brace a minimum of 2 more weeks.  Pt states she had much increased pain after prior Rx.  Pt reports having increased swelling after prior Rx.  Pt states the pain and swelling improved the following day.  Pt has been performing quad sets and ankle pumps  Pt tries the supine SLR though unable to perform well.     PERTINENT HISTORY: Anxiety, arthritis, depression, PVD, LBP  PAIN:  Are you having pain? Yes: NPRS scale: 6-7/10, 11/10 at worst  Pain location: left anterior knee surgical site Pain description: aching Aggravating factors: movement; transitions Relieving factors: rest   PRECAUTIONS: Knee   RED FLAGS: None      WEIGHT BEARING RESTRICTIONS: WBAT with brace locked in extension   FALLS:  Has patient fallen in last 6 months? No   LIVING ENVIRONMENT:  Lives with family- 2 children Staying with mother on couch for now  2 story home Crutches and walker    OCCUPATION:     PLOF: Independent   PATIENT GOALS: return to walking, return to exercise to help with her weight loss   OBJECTIVE:  Note: Objective measures were completed at Evaluation unless otherwise noted.   DIAGNOSTIC FINDINGS:  IMPRESSION: Mild chondromalacia of the lateral facet of the patella. No full-thickness cartilage defect is identified. There is a moderate-sized reactive joint effusion.   Abnormal signal in the anterior horn and anterior body of the lateral meniscus suggesting either an intrameniscal tear or dissecting intrameniscal cyst. Correlation for anterior lateral joint line pain. No displaced meniscal tear is present.   Electronically signed by: Norleen Satchel MD 03/10/2024 05:30 PM EDT  RP Workstation: [id number]   PATIENT SURVEYS:  LEFS 20/80   COGNITION: Overall cognitive status: Within functional limits for tasks assessed                         SENSATION: WFL   EDEMA:  Mild into lateral and medial patellar pouches    POSTURE: No Significant postural limitations   PALPATION: No TTP noted around incision sites, clean and dry   LOWER EXTREMITY ROM:   Active ROM Right  eval Left eval  Hip flexion      Hip extension      Hip abduction      Hip adduction      Hip internal rotation      Hip external rotation      Knee flexion  WNL 75  Knee extension  WNL 0  Ankle dorsiflexion      Ankle plantarflexion      Ankle inversion      Ankle eversion       (Blank rows = not tested) *= pain/symptoms   LOWER EXTREMITY MMT:    deferred due to surgical precautions.  Able to perform single SLR without extensor lag     GAIT: Distance walked: 24ft feet  Assistive device utilized: bilat axillary crutches Level of assistance: Modified independence Comments:  step through   TODAY'S TREATMENT:                                                                                                                              DATE: 06/20/24 Quad sets 2x10 with 5 sec hold Glute sets with 5 sec hold 2x10 AAROM with strap to tolerance into flexion.  Seated heel to toe raises  7/22  Reviewed pt presentation, pain levels, HEP compliance, and response to prior Rx. Reviewed HEP.  Supine SLR 2x10 with brace locked and min assist from PT Quad sets 2x10 with 5 sec hold Glute sets with 5 sec hold 2x10 S/L hip abduction 3x10  Pt received gentle L knee flexion and extension PROM in supine per pt and tissue tolerance w/n protocol range. Supine Heel slides with strap AAROM x 10 reps w/n protocol range  L knee flexion AAROM:  90 deg   7/17 -bandage change (gauze and new ACE wrap) -quad sets 5 2x10 -SLR with clinician assist x5, x4 - bridge with LES on physioball  3x10 -s/l hip abduction 3x10 -gait in hall with brace and bil crutches x14ft    Previous: Bandage change- no s/s of infection (no Tegaderm used-pt is highly allergic);  gauze replaced and new compression bandage applied; pt able to use paper tape  There-ex: gentle PROM into all planes        PATIENT EDUCATION:  Education details:  PT instructed she could perform glute sets at home.  protocol/precautions, protocol ROM limits, diagnosis, prognosis, anatomy, exercise progression, muscle firing,  HEP, POC  Person educated: Patient Education method: Explanation, Demonstration, verbal and tactile cues Education comprehension: verbalized understanding, returned demonstration, verbal cues required, and tactile cues required   HOME EXERCISE PROGRAM:  Access Code: [email or web address] URL: [email or web address] Date: 04/13/2024 Prepared by: Dale Call   ASSESSMENT:   CLINICAL IMPRESSION: Pt arrives today with brace locked at zero, and bilat axillary crutches. She states that she is having a lot of pain since falling. Further assessment she has swelling noted, and tenderness. Appt with low level exercises to pt's tolerance. She was able to continue with AAROM to ~  90 degrees knee flexion. Pt encouraged to message MD to inform him about her fall. Pt should benefit from cont skilled PT per protocol to address goals and impairments and to assist in restoring desired level of function.      OBJECTIVE IMPAIRMENTS: Abnormal gait, decreased activity tolerance, decreased balance, decreased endurance, decreased mobility, difficulty walking, decreased ROM, decreased strength, increased edema, increased muscle spasms, impaired flexibility, improper body mechanics, and pain.    ACTIVITY LIMITATIONS: carrying, lifting, bending, standing, squatting, stairs, transfers, hygiene/grooming, locomotion level, and caring for others   PARTICIPATION LIMITATIONS: meal prep, cleaning, laundry, shopping, community  activity, and exercise   PERSONAL FACTORS: Fitness, Time since onset of injury/illness/exacerbation, past experiences, and 2+ comorbidites are also affecting patient's functional outcome.    REHAB POTENTIAL: Good   CLINICAL DECISION MAKING: Stable/uncomplicated   EVALUATION COMPLEXITY: Low     GOALS:     SHORT TERM GOALS: Target date: 07/22/2024         Pt will become independent with HEP in order to demonstrate synthesis of PT education.  Baseline: Goal status: INITIAL   2.  Pt will have an at least 18  pt improvement in LEFS measure in order to demonstrate MCID improvement in daily function.   Baseline: Goal status: INITIAL   3.  Patient will demonstrate full L knee ROM for transfers, stairs, ambulation. Baseline:  Goal status: INITIAL       LONG TERM GOALS: Target date:   07/06/2024      Pt  will become independent with final HEP in order to demonstrate synthesis of PT education. Baseline:  Goal status: INITIAL   2.  Pt will have an at least 27 pt improvement in LEFS measure in order to demonstrate MCID improvement in daily function.   Baseline:  Goal status: INITIAL   3.  Pt will be able to lift/squat/hold >25 lbs in order to demonstrate functional improvement in lumbopelvic strength for return to light and normal ADL.  Baseline:  Goal status: INITIAL   4.  Pt will be able to demonstrate full reciprocal stair management without UE in order to demonstrate functional improvement in LE function for self-care and daily mobility.    Baseline:  Goal status: INITIAL   5.  Pt will be able to demonstrate at least 80% Quad Index in order to demonstrate functional improvement of surgical knee quad strength for advancing to next phase of protocol.    Baseline:  Goal status: INITIAL      PLAN:   PT FREQUENCY: 1-2x/week   PT DURATION: 12 weeks   PLANNED INTERVENTIONS: Therapeutic exercises, Therapeutic activity, Neuromuscular re-education, Balance training,  Gait training, Patient/Family education, Joint manipulation, Joint mobilization, Stair training, Orthotic/Fit training, DME instructions, Aquatic Therapy, Dry Needling, Electrical stimulation, Spinal manipulation, Spinal mobilization, Cryotherapy, Moist heat, Compression bandaging, scar mobilization, Splintting, Taping, Traction, Ultrasound, Ionotophoresis 4mg /ml Dexamethasone , and Manual therapy   PLAN FOR NEXT SESSION: f/u with HEP, progress per MACI protocol    Rojean Batten PT, DPT 06/20/24  12:31 PM

## 2024-06-20 NOTE — Telephone Encounter (Signed)
 Patient had a fall today and PT wants her to get in earlier

## 2024-06-21 NOTE — Telephone Encounter (Signed)
 Dr. Genelle responded via mychart

## 2024-06-22 ENCOUNTER — Telehealth (HOSPITAL_BASED_OUTPATIENT_CLINIC_OR_DEPARTMENT_OTHER): Payer: Self-pay

## 2024-06-22 ENCOUNTER — Ambulatory Visit (HOSPITAL_BASED_OUTPATIENT_CLINIC_OR_DEPARTMENT_OTHER)

## 2024-06-22 NOTE — Telephone Encounter (Signed)
 Called and spoke with pt regarding missed appt. She reports she cancelled it through MyChart as she is only supposed to come 1x/week. Rescheduled for next week.

## 2024-06-22 NOTE — Therapy (Deleted)
 OUTPATIENT PHYSICAL THERAPY LOWER EXTREMITY TREATMENT   Patient Name: Diane [name] MRN: [id number] DOB:Oct 21, 1981, 43 y.o., female Today's Date: 06/22/2024  END OF SESSION:        Past Medical History:  Diagnosis Date   Acute injury of left knee cartilage 05/24/2024   ADD (attention deficit disorder with hyperactivity)    Allergy     Anxiety    Arthritis    Atypical chest pain 07/26/2021   Atypical chest pain     Benign intracranial hypertension 06/16/2008   Qualifier: Diagnosis of   By: Donzell Smolder         Choking    due to food and pills get stuck   Cholelithiasis and acute cholecystitis without obstruction 12/02/2016   Chronic cough 12/27/2013   Depression    Dry mouth 08/12/2017   Dysmenorrhea    Family history of adverse reaction to anesthesia    pt's father has hx. of being hard to wake up post-op   Gastric ulcer 03/16/2015   GERD (gastroesophageal reflux disease)    Headache 07/09/2007   Qualifier: Diagnosis of   By: Tammie MD, Bruce      Replacing diagnoses that were inactivated after the 02/16/23 regulatory import     History of gastric ulcer    History of kidney stones    Hyperlipidemia    IBS (irritable bowel syndrome)    Lower extremity edema 07/26/2021   Lower extremity edema     Migraines    neurologist-  dr c. hagen ([facility] in [place])-- treated with Emgality  injection every 30 days   Mild obstructive sleep apnea    per study in epic 03-21-2017 mild osa, recommendation cpap, mouth appliance, wt loss   Nausea and vomiting 12/03/2016   OAB (overactive bladder) 08/12/2017   Osteoarthritis    right ankle   Osteochondral lesion 09/2016   right ankle   Osteomyelitis of knee region [facility])    Peripheral vascular disease (HCC)    NERVE DAMAGE RIGHT LEG AND FOOT DUE TO INJURGY.    Plantar fasciitis of left foot    Pleural effusion 12/15/2013   PONV (postoperative nausea and vomiting)    AND HEADACHES   Pseudotumor cerebri     at back head   RUQ pain    S/P dilatation of esophageal stricture 07/2018   Sinusitis 08/12/2018   RESOLVED WITH ANTIOBIOTIC   SUI (stress urinary incontinence, female)    Wears contact lenses    Wears partial dentures    upper   Past Surgical History:  Procedure Laterality Date   ANKLE ARTHROSCOPY Right 10/22/2016   Procedure: ANKLE ARTHROSCOPY;  Surgeon: Donnice JONELLE Fees, DPM;  Location: [facility];  Service: Podiatry;  Laterality: Right;  GENERAL/REG BLOCK   ANKLE ARTHROSCOPY Right 09-15-2017   @[facility]    BLADDER SURGERY  child   x 2 - as a child to stretch bladder   CESAREAN SECTION  12/24/2009   CESAREAN SECTION  11/20/2011   Procedure: CESAREAN SECTION;  Surgeon: Charlie JINNY Flowers, MD;  Location: [facility];  Service: Gynecology;  Laterality: N/A;   CHOLECYSTECTOMY N/A 12/03/2016   Procedure: LAPAROSCOPIC CHOLECYSTECTOMY WITH INTRAOPERATIVE CHOLANGIOGRAM;  Surgeon: Krystal Spinner, MD;  Location: [facility];  Service: General;  Laterality: N/A;   CHONDROPLASTY Right 01/12/2024   Procedure: RIGHT KNEE ARTHROSCOPY LATERAL FEMORAL CHONDROPLASTY;  Surgeon: Genelle Standing, MD;  Location: [facility];  Service: Orthopedics;  Laterality: Right;   CYSTOSCOPY WITH RETROGRADE PYELOGRAM, URETEROSCOPY AND STENT PLACEMENT  Left 08/10/2006   ESOPHAGOGASTRODUODENOSCOPY (EGD) WITH ESOPHAGEAL DILATION  07/2018   HARDWARE REMOVAL Right 07/01/2016   Procedure: HARDWARE REMOVAL;  Surgeon: Glendia Cordella Hutchinson, MD;  Location: [facility];  Service: Orthopedics;  Laterality: Right;   HARVEST BONE GRAFT Right 01/12/2024   Procedure: RIGHT BONE MARROW ASPIRATE FROM ILIAC CREST DECOMPRESSION AND PLACEMENT RIGHT LATERAL FEMORAL CONDYLE;  Surgeon: Genelle Standing, MD;  Location: [facility];  Service: Orthopedics;  Laterality: Right;   IMPLANTATION, MATRIX-INDUCED AUTOLOGOUS CHONDROCYTES Left 05/24/2024   Procedure: IMPLANTATION, MATRIX-INDUCED AUTOLOGOUS CHONDROCYTES;  Surgeon: Genelle Standing,  MD;  Location: [facility];  Service: Orthopedics;  Laterality: Left;  LEFT KNEE MATRIX ASSOCIATED CHONDROCYTE IMPLANTATION   KNEE ARTHROSCOPY Left 05/24/2024   Procedure: ARTHROSCOPY, KNEE;  Surgeon: Genelle Standing, MD;  Location: [facility];  Service: Orthopedics;  Laterality: Left;   KNEE ARTHROSCOPY WITH LATERAL RELEASE Right 12/13/2014   Procedure: KNEE ARTHROSCOPY WITH LATERAL RELEASE;  Surgeon: LELON JONETTA Shari Mickey., MD;  Location: [facility];  Service: Orthopedics;  Laterality: Right;   LAPAROSCOPIC APPENDECTOMY N/A 06/11/2014   Procedure: APPENDECTOMY LAPAROSCOPIC;  Surgeon: Krystal CHRISTELLA Spinner, MD;  Location: [facility];  Service: General;  Laterality: N/A;   ORIF ANKLE FRACTURE Right 01/05/2016   Procedure: OPEN REDUCTION INTERNAL FIXATION (ORIF) ANKLE FRACTURE;  Surgeon: Glendia Cordella Hutchinson, MD;  Location: [facility];  Service: Orthopedics;  Laterality: Right;   OVARIAN CYST SURGERY Right 03/05/2007   AND CHROMOPERTUBATION  VIA LAPAROSCOPY   ROBOTIC ASSISTED LAPAROSCOPIC HYSTERECTOMY AND SALPINGECTOMY Bilateral 08/27/2018   Procedure: XI ROBOTIC ASSISTED LAPAROSCOPIC HYSTERECTOMY AND BILATERAL SALPINGECTOMY;  Surgeon: Gorge Ade, MD;  Location: [facility];  Service: Gynecology;  Laterality: Bilateral;  [facility] for 23hr OBS   SYNOVECTOMY Right 12/13/2014   Procedure: PLICA SYNOVECTOMY;  Surgeon: LELON JONETTA Shari Mickey., MD;  Location: [facility];  Service: Orthopedics;  Laterality: Right;   TONSILLECTOMY AND ADENOIDECTOMY  child   WISDOM TOOTH EXTRACTION     Patient Active Problem List   Diagnosis Date Noted   Flexural eczema 06/19/2024   Insomnia 06/19/2024   FH: breast cancer 06/19/2024   Chondromalacia of left patella 05/24/2024   Peripheral tear of lateral meniscus of left knee as current injury 05/24/2024   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 01/12/2024   AVN (avascular necrosis of bone) (HCC) 01/12/2024   Prediabetes 07/14/2023   Ankle swelling  07/14/2023   Menopausal syndrome (hot flushes) 07/13/2023   Hyperlipidemia 06/19/2023   Intractable pain 06/17/2023   Chronic constipation 06/17/2023   Other idiopathic scoliosis, lumbar region 06/17/2023   Dysphagia 08/12/2017   Osteochondral lesion 06/03/2017   OSA (obstructive sleep apnea) 03/24/2017   Ankle pain, chronic 01/05/2016   Knee pain, right, chronic 04/12/2015   Chronic migraine without aura 04/01/2013   Depressive disorder 10/02/2008   Opioid use disorder in remission 10/02/2008   back pain, chronic 03/28/2008   Obesity (BMI 30-39.9) 01/25/2008   Anxiety state 11/09/2007    PCP: Jesus Bernardino MATSU, MD   REFERRING PROVIDER: Genelle Standing, MD   REFERRING DIAG:  [id number] (ICD-10-CM) - Peripheral tear of lateral meniscus of left knee as current injury, initial encounter      THERAPY DIAG:  No diagnosis found.  Rationale for Evaluation and Treatment: Rehabilitation  ONSET DATE: 04/05/2024 DOS for meniscal repair  05/24/2024 MACI procedure   PROCEDURE: 1. Left knee lateral meniscal repair with cartilage biopsy   SUBJECTIVE:   PROCEDURE: 1.  Left knee  lateral meniscal repair Left MACI repair    SUBJECTIVE:    SUBJECTIVE STATEMENT:  Pt states that she was about to step into the shower this morning, and fell. She reports being in a lot of pain. She fell backwards into her tub, hitting her back and legs.   Pt states that she has been having a ton of pain in her knee where her surgical site is. She is having pain with standing, walking and moving the wrong way.     Pt is 2 weeks and 1 day s/p MACI procedure.  Pt saw MD earlier today and he states it looked good.  Pt states MD informed her she would need to use the brace a minimum of 2 more weeks.  Pt states she had much increased pain after prior Rx.  Pt reports having increased swelling after prior Rx.  Pt states the pain and swelling improved the following day.  Pt has been performing quad sets and ankle  pumps  Pt tries the supine SLR though unable to perform well.     PERTINENT HISTORY: Anxiety, arthritis, depression, PVD, LBP  PAIN:  Are you having pain? Yes: NPRS scale: 6-7/10, 11/10 at worst  Pain location: left anterior knee surgical site Pain description: aching Aggravating factors: movement; transitions Relieving factors: rest   PRECAUTIONS: Knee   RED FLAGS: None      WEIGHT BEARING RESTRICTIONS: WBAT with brace locked in extension   FALLS:  Has patient fallen in last 6 months? No   LIVING ENVIRONMENT:  Lives with family- 2 children Staying with mother on couch for now  2 story home Crutches and walker    OCCUPATION:     PLOF: Independent   PATIENT GOALS: return to walking, return to exercise to help with her weight loss   OBJECTIVE:  Note: Objective measures were completed at Evaluation unless otherwise noted.   DIAGNOSTIC FINDINGS:  IMPRESSION: Mild chondromalacia of the lateral facet of the patella. No full-thickness cartilage defect is identified. There is a moderate-sized reactive joint effusion.   Abnormal signal in the anterior horn and anterior body of the lateral meniscus suggesting either an intrameniscal tear or dissecting intrameniscal cyst. Correlation for anterior lateral joint line pain. No displaced meniscal tear is present.   Electronically signed by: Norleen Satchel MD 03/10/2024 05:30 PM EDT RP Workstation: [id number]   PATIENT SURVEYS:  LEFS 20/80   COGNITION: Overall cognitive status: Within functional limits for tasks assessed                         SENSATION: WFL   EDEMA:  Mild into lateral and medial patellar pouches    POSTURE: No Significant postural limitations   PALPATION: No TTP noted around incision sites, clean and dry   LOWER EXTREMITY ROM:   Active ROM Right eval Left eval  Hip flexion      Hip extension      Hip abduction      Hip adduction      Hip internal rotation      Hip external rotation       Knee flexion  WNL 75  Knee extension  WNL 0  Ankle dorsiflexion      Ankle plantarflexion      Ankle inversion      Ankle eversion       (Blank rows = not tested) *= pain/symptoms   LOWER EXTREMITY MMT:    deferred due to surgical precautions.  Able to perform single SLR without extensor lag     GAIT: Distance walked: 50ft feet  Assistive device utilized: bilat axillary crutches Level of assistance: Modified independence Comments:  step through   TODAY'S TREATMENT:                                                                                                                              DATE:  06/22/24 Quad sets 2x10 with 5 sec hold Glute sets with 5 sec hold 2x10 AAROM with strap to tolerance into flexion.  Seated heel to toe raises  06/20/24 Quad sets 2x10 with 5 sec hold Glute sets with 5 sec hold 2x10 AAROM with strap to tolerance into flexion.  Seated heel to toe raises  7/22  Reviewed pt presentation, pain levels, HEP compliance, and response to prior Rx. Reviewed HEP.  Supine SLR 2x10 with brace locked and min assist from PT Quad sets 2x10 with 5 sec hold Glute sets with 5 sec hold 2x10 S/L hip abduction 3x10  Pt received gentle L knee flexion and extension PROM in supine per pt and tissue tolerance w/n protocol range. Supine Heel slides with strap AAROM x 10 reps w/n protocol range  L knee flexion AAROM:  90 deg   7/17 -bandage change (gauze and new ACE wrap) -quad sets 5 2x10 -SLR with clinician assist x5, x4 - bridge with LES on physioball 3x10 -s/l hip abduction 3x10 -gait in hall with brace and bil crutches x175ft    Previous: Bandage change- no s/s of infection (no Tegaderm used-pt is highly allergic);  gauze replaced and new compression bandage applied; pt able to use paper tape  There-ex: gentle PROM into all planes        PATIENT EDUCATION:  Education details:  PT instructed she could perform glute sets at home.   protocol/precautions, protocol ROM limits, diagnosis, prognosis, anatomy, exercise progression, muscle firing,  HEP, POC  Person educated: Patient Education method: Explanation, Demonstration, verbal and tactile cues Education comprehension: verbalized understanding, returned demonstration, verbal cues required, and tactile cues required   HOME EXERCISE PROGRAM:  Access Code: [email or web address] URL: [email or web address] Date: 04/13/2024 Prepared by: Dale Call   ASSESSMENT:   CLINICAL IMPRESSION: Pt arrives today with brace locked at zero, and bilat axillary crutches. She states that she is having a lot of pain since falling. Further assessment she has swelling noted, and tenderness. Appt with low level exercises to pt's tolerance. She was able to continue with AAROM to ~90 degrees knee flexion. Pt encouraged to message MD to inform him about her fall. Pt should benefit from cont skilled PT per protocol to address goals and impairments and to assist in restoring desired level of function.      OBJECTIVE IMPAIRMENTS: Abnormal gait, decreased activity tolerance, decreased balance, decreased endurance, decreased mobility, difficulty walking, decreased ROM, decreased strength, increased edema, increased muscle spasms, impaired flexibility, improper body mechanics, and pain.    ACTIVITY LIMITATIONS: carrying,  lifting, bending, standing, squatting, stairs, transfers, hygiene/grooming, locomotion level, and caring for others   PARTICIPATION LIMITATIONS: meal prep, cleaning, laundry, shopping, community activity, and exercise   PERSONAL FACTORS: Fitness, Time since onset of injury/illness/exacerbation, past experiences, and 2+ comorbidites are also affecting patient's functional outcome.    REHAB POTENTIAL: Good   CLINICAL DECISION MAKING: Stable/uncomplicated   EVALUATION COMPLEXITY: Low     GOALS:     SHORT TERM GOALS: Target date: 07/22/2024         Pt will become independent  with HEP in order to demonstrate synthesis of PT education.  Baseline: Goal status: INITIAL   2.  Pt will have an at least 18  pt improvement in LEFS measure in order to demonstrate MCID improvement in daily function.   Baseline: Goal status: INITIAL   3.  Patient will demonstrate full L knee ROM for transfers, stairs, ambulation. Baseline:  Goal status: INITIAL       LONG TERM GOALS: Target date:   07/06/2024      Pt  will become independent with final HEP in order to demonstrate synthesis of PT education. Baseline:  Goal status: INITIAL   2.  Pt will have an at least 27 pt improvement in LEFS measure in order to demonstrate MCID improvement in daily function.   Baseline:  Goal status: INITIAL   3.  Pt will be able to lift/squat/hold >25 lbs in order to demonstrate functional improvement in lumbopelvic strength for return to light and normal ADL.  Baseline:  Goal status: INITIAL   4.  Pt will be able to demonstrate full reciprocal stair management without UE in order to demonstrate functional improvement in LE function for self-care and daily mobility.    Baseline:  Goal status: INITIAL   5.  Pt will be able to demonstrate at least 80% Quad Index in order to demonstrate functional improvement of surgical knee quad strength for advancing to next phase of protocol.    Baseline:  Goal status: INITIAL      PLAN:   PT FREQUENCY: 1-2x/week   PT DURATION: 12 weeks   PLANNED INTERVENTIONS: Therapeutic exercises, Therapeutic activity, Neuromuscular re-education, Balance training, Gait training, Patient/Family education, Joint manipulation, Joint mobilization, Stair training, Orthotic/Fit training, DME instructions, Aquatic Therapy, Dry Needling, Electrical stimulation, Spinal manipulation, Spinal mobilization, Cryotherapy, Moist heat, Compression bandaging, scar mobilization, Splintting, Taping, Traction, Ultrasound, Ionotophoresis 4mg /ml Dexamethasone , and Manual therapy    PLAN FOR NEXT SESSION: f/u with HEP, progress per MACI protocol    Asberry Rodes, PTA  06/22/24  11:01 AM

## 2024-06-28 ENCOUNTER — Encounter (HOSPITAL_BASED_OUTPATIENT_CLINIC_OR_DEPARTMENT_OTHER): Payer: Self-pay | Admitting: Physical Therapy

## 2024-06-29 ENCOUNTER — Ambulatory Visit (HOSPITAL_BASED_OUTPATIENT_CLINIC_OR_DEPARTMENT_OTHER): Payer: Self-pay | Admitting: Physical Therapy

## 2024-06-29 NOTE — Therapy (Incomplete)
 OUTPATIENT PHYSICAL THERAPY LOWER EXTREMITY TREATMENT   Patient Name: Diane [name] MRN: [id number] DOB:[birth year], 43 y.o., female Today's Date: 06/29/2024  END OF SESSION:        Past Medical History:  Diagnosis Date   Acute injury of left knee cartilage 05/24/2024   ADD (attention deficit disorder with hyperactivity)    Allergy     Anxiety    Arthritis    Atypical chest pain 07/26/2021   Atypical chest pain     Benign intracranial hypertension 06/16/2008   Qualifier: Diagnosis of   By: Donzell Smolder         Choking    due to food and pills get stuck   Cholelithiasis and acute cholecystitis without obstruction 12/02/2016   Chronic cough 12/27/2013   Depression    Dry mouth 08/12/2017   Dysmenorrhea    Family history of adverse reaction to anesthesia    pt's father has hx. of being hard to wake up post-op   Gastric ulcer 03/16/2015   GERD (gastroesophageal reflux disease)    Headache 07/09/2007   Qualifier: Diagnosis of   By: Tammie MD, Bruce      Replacing diagnoses that were inactivated after the 02/16/23 regulatory import     History of gastric ulcer    History of kidney stones    Hyperlipidemia    IBS (irritable bowel syndrome)    Lower extremity edema 07/26/2021   Lower extremity edema     Migraines    neurologist-  dr c. hagen ([facility] in [place])-- treated with Emgality  injection every 30 days   Mild obstructive sleep apnea    per study in epic 03-21-2017 mild osa, recommendation cpap, mouth appliance, wt loss   Nausea and vomiting 12/03/2016   OAB (overactive bladder) 08/12/2017   Osteoarthritis    right ankle   Osteochondral lesion 09/2016   right ankle   Osteomyelitis of knee region [facility])    Peripheral vascular disease (HCC)    NERVE DAMAGE RIGHT LEG AND FOOT DUE TO INJURGY.    Plantar fasciitis of left foot    Pleural effusion 12/15/2013   PONV (postoperative nausea and vomiting)    AND HEADACHES   Pseudotumor cerebri     at back head   RUQ pain    S/P dilatation of esophageal stricture 07/2018   Sinusitis 08/12/2018   RESOLVED WITH ANTIOBIOTIC   SUI (stress urinary incontinence, female)    Wears contact lenses    Wears partial dentures    upper   Past Surgical History:  Procedure Laterality Date   ANKLE ARTHROSCOPY Right 10/22/2016   Procedure: ANKLE ARTHROSCOPY;  Surgeon: Donnice JONELLE Fees, DPM;  Location: [facility];  Service: Podiatry;  Laterality: Right;  GENERAL/REG BLOCK   ANKLE ARTHROSCOPY Right 09-15-2017   @[facility]    BLADDER SURGERY  child   x 2 - as a child to stretch bladder   CESAREAN SECTION  12/24/2009   CESAREAN SECTION  11/20/2011   Procedure: CESAREAN SECTION;  Surgeon: Charlie JINNY Flowers, MD;  Location: [facility];  Service: Gynecology;  Laterality: N/A;   CHOLECYSTECTOMY N/A 12/03/2016   Procedure: LAPAROSCOPIC CHOLECYSTECTOMY WITH INTRAOPERATIVE CHOLANGIOGRAM;  Surgeon: Krystal Spinner, MD;  Location: [facility];  Service: General;  Laterality: N/A;   CHONDROPLASTY Right 01/12/2024   Procedure: RIGHT KNEE ARTHROSCOPY LATERAL FEMORAL CHONDROPLASTY;  Surgeon: Genelle Standing, MD;  Location: [facility];  Service: Orthopedics;  Laterality: Right;   CYSTOSCOPY WITH RETROGRADE PYELOGRAM, URETEROSCOPY AND STENT PLACEMENT  Left 08/10/2006   ESOPHAGOGASTRODUODENOSCOPY (EGD) WITH ESOPHAGEAL DILATION  07/2018   HARDWARE REMOVAL Right 07/01/2016   Procedure: HARDWARE REMOVAL;  Surgeon: Glendia Cordella Hutchinson, MD;  Location: [facility];  Service: Orthopedics;  Laterality: Right;   HARVEST BONE GRAFT Right 01/12/2024   Procedure: RIGHT BONE MARROW ASPIRATE FROM ILIAC CREST DECOMPRESSION AND PLACEMENT RIGHT LATERAL FEMORAL CONDYLE;  Surgeon: Genelle Standing, MD;  Location: [facility];  Service: Orthopedics;  Laterality: Right;   IMPLANTATION, MATRIX-INDUCED AUTOLOGOUS CHONDROCYTES Left 05/24/2024   Procedure: IMPLANTATION, MATRIX-INDUCED AUTOLOGOUS CHONDROCYTES;  Surgeon: Genelle Standing,  MD;  Location: [facility];  Service: Orthopedics;  Laterality: Left;  LEFT KNEE MATRIX ASSOCIATED CHONDROCYTE IMPLANTATION   KNEE ARTHROSCOPY Left 05/24/2024   Procedure: ARTHROSCOPY, KNEE;  Surgeon: Genelle Standing, MD;  Location: [facility];  Service: Orthopedics;  Laterality: Left;   KNEE ARTHROSCOPY WITH LATERAL RELEASE Right 12/13/2014   Procedure: KNEE ARTHROSCOPY WITH LATERAL RELEASE;  Surgeon: LELON JONETTA Shari Mickey., MD;  Location: [facility];  Service: Orthopedics;  Laterality: Right;   LAPAROSCOPIC APPENDECTOMY N/A 06/11/2014   Procedure: APPENDECTOMY LAPAROSCOPIC;  Surgeon: Krystal CHRISTELLA Spinner, MD;  Location: [facility];  Service: General;  Laterality: N/A;   ORIF ANKLE FRACTURE Right 01/05/2016   Procedure: OPEN REDUCTION INTERNAL FIXATION (ORIF) ANKLE FRACTURE;  Surgeon: Glendia Cordella Hutchinson, MD;  Location: [facility];  Service: Orthopedics;  Laterality: Right;   OVARIAN CYST SURGERY Right 03/05/2007   AND CHROMOPERTUBATION  VIA LAPAROSCOPY   ROBOTIC ASSISTED LAPAROSCOPIC HYSTERECTOMY AND SALPINGECTOMY Bilateral 08/27/2018   Procedure: XI ROBOTIC ASSISTED LAPAROSCOPIC HYSTERECTOMY AND BILATERAL SALPINGECTOMY;  Surgeon: Gorge Ade, MD;  Location: [facility];  Service: Gynecology;  Laterality: Bilateral;  [facility] for 23hr OBS   SYNOVECTOMY Right 12/13/2014   Procedure: PLICA SYNOVECTOMY;  Surgeon: LELON JONETTA Shari Mickey., MD;  Location: [facility];  Service: Orthopedics;  Laterality: Right;   TONSILLECTOMY AND ADENOIDECTOMY  child   WISDOM TOOTH EXTRACTION     Patient Active Problem List   Diagnosis Date Noted   Flexural eczema 06/19/2024   Insomnia 06/19/2024   FH: breast cancer 06/19/2024   Chondromalacia of left patella 05/24/2024   Peripheral tear of lateral meniscus of left knee as current injury 05/24/2024   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 01/12/2024   AVN (avascular necrosis of bone) (HCC) 01/12/2024   Prediabetes 07/14/2023   Ankle swelling  07/14/2023   Menopausal syndrome (hot flushes) 07/13/2023   Hyperlipidemia 06/19/2023   Intractable pain 06/17/2023   Chronic constipation 06/17/2023   Other idiopathic scoliosis, lumbar region 06/17/2023   Dysphagia 08/12/2017   Osteochondral lesion 06/03/2017   OSA (obstructive sleep apnea) 03/24/2017   Ankle pain, chronic 01/05/2016   Knee pain, right, chronic 04/12/2015   Chronic migraine without aura 04/01/2013   Depressive disorder 10/02/2008   Opioid use disorder in remission 10/02/2008   back pain, chronic 03/28/2008   Obesity (BMI 30-39.9) 01/25/2008   Anxiety state 11/09/2007    PCP: Jesus Bernardino MATSU, MD   REFERRING PROVIDER: Genelle Standing, MD   REFERRING DIAG:  [phone] (ICD-10-CM) - Peripheral tear of lateral meniscus of left knee as current injury, initial encounter      THERAPY DIAG:  No diagnosis found.  Rationale for Evaluation and Treatment: Rehabilitation  ONSET DATE: 04/05/2024 DOS for meniscal repair  05/24/2024 MACI procedure   PROCEDURE: 1. Left knee lateral meniscal repair with cartilage biopsy   SUBJECTIVE:   PROCEDURE: 1.  Left knee  lateral meniscal repair Left MACI repair    SUBJECTIVE:    SUBJECTIVE STATEMENT:  Pt is 5 weeks and 1 day s/p MACI procedure Pt states that she was about to step into the shower this morning, and fell. She reports being in a lot of pain. She fell backwards into her tub, hitting her back and legs.   Pt states that she has been having a ton of pain in her knee where her surgical site is. She is having pain with standing, walking and moving the wrong way.     Pt is 2 weeks and 1 day s/p MACI procedure.  Pt saw MD earlier today and he states it looked good.  Pt states MD informed her she would need to use the brace a minimum of 2 more weeks.  Pt states she had much increased pain after prior Rx.  Pt reports having increased swelling after prior Rx.  Pt states the pain and swelling improved the following day.  Pt  has been performing quad sets and ankle pumps  Pt tries the supine SLR though unable to perform well.   L knee lateral meniscal repair with cartilage biopsy on 04/05/24    PERTINENT HISTORY: Anxiety, arthritis, depression, PVD, LBP L knee lateral meniscal repair with cartilage biopsy  PAIN:  Are you having pain? Yes: NPRS scale: 6-7/10, 11/10 at worst  Pain location: left anterior knee surgical site Pain description: aching Aggravating factors: movement; transitions Relieving factors: rest   PRECAUTIONS: Knee   RED FLAGS: None      WEIGHT BEARING RESTRICTIONS: WBAT with brace locked in extension   FALLS:  Has patient fallen in last 6 months? No   LIVING ENVIRONMENT:  Lives with family- 2 children Staying with mother on couch for now  2 story home Crutches and walker    OCCUPATION:     PLOF: Independent   PATIENT GOALS: return to walking, return to exercise to help with her weight loss   OBJECTIVE:  Note: Objective measures were completed at Evaluation unless otherwise noted.   DIAGNOSTIC FINDINGS:  IMPRESSION: Mild chondromalacia of the lateral facet of the patella. No full-thickness cartilage defect is identified. There is a moderate-sized reactive joint effusion.   Abnormal signal in the anterior horn and anterior body of the lateral meniscus suggesting either an intrameniscal tear or dissecting intrameniscal cyst. Correlation for anterior lateral joint line pain. No displaced meniscal tear is present.   Electronically signed by: Norleen Satchel MD 03/10/2024 05:30 PM EDT RP Workstation: [id number]   PATIENT SURVEYS:  LEFS 20/80   COGNITION: Overall cognitive status: Within functional limits for tasks assessed                         SENSATION: WFL   EDEMA:  Mild into lateral and medial patellar pouches    POSTURE: No Significant postural limitations   PALPATION: No TTP noted around incision sites, clean and dry   LOWER EXTREMITY ROM:    Active ROM Right eval Left eval  Hip flexion      Hip extension      Hip abduction      Hip adduction      Hip internal rotation      Hip external rotation      Knee flexion  WNL 75  Knee extension  WNL 0  Ankle dorsiflexion      Ankle plantarflexion      Ankle inversion  Ankle eversion       (Blank rows = not tested) *= pain/symptoms   LOWER EXTREMITY MMT:    deferred due to surgical precautions.  Able to perform single SLR without extensor lag     GAIT: Distance walked: 30ft feet  Assistive device utilized: bilat axillary crutches Level of assistance: Modified independence Comments:  step through   TODAY'S TREATMENT:                                                                                                                              DATE:  06/29/24  Quad sets 2x10 with 5 sec hold Glute sets with 5 sec hold 2x10 AAROM with strap to tolerance into flexion.  Seated heel to toe raises  06/20/24 Quad sets 2x10 with 5 sec hold Glute sets with 5 sec hold 2x10 AAROM with strap to tolerance into flexion.  Seated heel to toe raises  7/22  Reviewed pt presentation, pain levels, HEP compliance, and response to prior Rx. Reviewed HEP.  Supine SLR 2x10 with brace locked and min assist from PT Quad sets 2x10 with 5 sec hold Glute sets with 5 sec hold 2x10 S/L hip abduction 3x10  Pt received gentle L knee flexion and extension PROM in supine per pt and tissue tolerance w/n protocol range. Supine Heel slides with strap AAROM x 10 reps w/n protocol range  L knee flexion AAROM:  90 deg   7/17 -bandage change (gauze and new ACE wrap) -quad sets 5 2x10 -SLR with clinician assist x5, x4 - bridge with LES on physioball 3x10 -s/l hip abduction 3x10 -gait in hall with brace and bil crutches x160ft    Previous: Bandage change- no s/s of infection (no Tegaderm used-pt is highly allergic);  gauze replaced and new compression bandage applied; pt able to use  paper tape  There-ex: gentle PROM into all planes        PATIENT EDUCATION:  Education details:  PT instructed she could perform glute sets at home.  protocol/precautions, protocol ROM limits, diagnosis, prognosis, anatomy, exercise progression, muscle firing,  HEP, POC  Person educated: Patient Education method: Explanation, Demonstration, verbal and tactile cues Education comprehension: verbalized understanding, returned demonstration, verbal cues required, and tactile cues required   HOME EXERCISE PROGRAM:  Access Code: [email or web address] URL: [email or web address] Date: 04/13/2024 Prepared by: Dale Call   ASSESSMENT:   CLINICAL IMPRESSION: Pt arrives today with brace locked at zero, and bilat axillary crutches. She states that she is having a lot of pain since falling. Further assessment she has swelling noted, and tenderness. Appt with low level exercises to pt's tolerance. She was able to continue with AAROM to ~90 degrees knee flexion. Pt encouraged to message MD to inform him about her fall. Pt should benefit from cont skilled PT per protocol to address goals and impairments and to assist in restoring desired level of function.      OBJECTIVE IMPAIRMENTS: Abnormal gait, decreased activity tolerance,  decreased balance, decreased endurance, decreased mobility, difficulty walking, decreased ROM, decreased strength, increased edema, increased muscle spasms, impaired flexibility, improper body mechanics, and pain.    ACTIVITY LIMITATIONS: carrying, lifting, bending, standing, squatting, stairs, transfers, hygiene/grooming, locomotion level, and caring for others   PARTICIPATION LIMITATIONS: meal prep, cleaning, laundry, shopping, community activity, and exercise   PERSONAL FACTORS: Fitness, Time since onset of injury/illness/exacerbation, past experiences, and 2+ comorbidites are also affecting patient's functional outcome.    REHAB POTENTIAL: Good   CLINICAL DECISION  MAKING: Stable/uncomplicated   EVALUATION COMPLEXITY: Low     GOALS:     SHORT TERM GOALS: Target date: 07/22/2024         Pt will become independent with HEP in order to demonstrate synthesis of PT education.  Baseline: Goal status: INITIAL   2.  Pt will have an at least 18  pt improvement in LEFS measure in order to demonstrate MCID improvement in daily function.   Baseline: Goal status: INITIAL   3.  Patient will demonstrate full L knee ROM for transfers, stairs, ambulation. Baseline:  Goal status: INITIAL       LONG TERM GOALS: Target date:   07/06/2024      Pt  will become independent with final HEP in order to demonstrate synthesis of PT education. Baseline:  Goal status: INITIAL   2.  Pt will have an at least 27 pt improvement in LEFS measure in order to demonstrate MCID improvement in daily function.   Baseline:  Goal status: INITIAL   3.  Pt will be able to lift/squat/hold >25 lbs in order to demonstrate functional improvement in lumbopelvic strength for return to light and normal ADL.  Baseline:  Goal status: INITIAL   4.  Pt will be able to demonstrate full reciprocal stair management without UE in order to demonstrate functional improvement in LE function for self-care and daily mobility.    Baseline:  Goal status: INITIAL   5.  Pt will be able to demonstrate at least 80% Quad Index in order to demonstrate functional improvement of surgical knee quad strength for advancing to next phase of protocol.    Baseline:  Goal status: INITIAL      PLAN:   PT FREQUENCY: 1-2x/week   PT DURATION: 12 weeks   PLANNED INTERVENTIONS: Therapeutic exercises, Therapeutic activity, Neuromuscular re-education, Balance training, Gait training, Patient/Family education, Joint manipulation, Joint mobilization, Stair training, Orthotic/Fit training, DME instructions, Aquatic Therapy, Dry Needling, Electrical stimulation, Spinal manipulation, Spinal mobilization,  Cryotherapy, Moist heat, Compression bandaging, scar mobilization, Splintting, Taping, Traction, Ultrasound, Ionotophoresis 4mg /ml Dexamethasone , and Manual therapy   PLAN FOR NEXT SESSION: f/u with HEP, progress per MACI protocol    Asberry Rodes, PTA  06/29/24  7:48 AM

## 2024-06-30 ENCOUNTER — Encounter (HOSPITAL_BASED_OUTPATIENT_CLINIC_OR_DEPARTMENT_OTHER): Payer: Self-pay | Admitting: Orthopaedic Surgery

## 2024-06-30 NOTE — Telephone Encounter (Signed)
 Diane [name] is off today. But I went and called patient. She is scheduled to come in tomorrow at 9:45.

## 2024-07-01 ENCOUNTER — Ambulatory Visit (INDEPENDENT_AMBULATORY_CARE_PROVIDER_SITE_OTHER): Admitting: Orthopaedic Surgery

## 2024-07-01 ENCOUNTER — Other Ambulatory Visit (HOSPITAL_BASED_OUTPATIENT_CLINIC_OR_DEPARTMENT_OTHER): Payer: Self-pay

## 2024-07-01 DIAGNOSIS — S83262A Peripheral tear of lateral meniscus, current injury, left knee, initial encounter: Secondary | ICD-10-CM | POA: Diagnosis not present

## 2024-07-01 MED ORDER — LIDOCAINE HCL 1 % IJ SOLN
4.0000 mL | INTRAMUSCULAR | Status: AC | PRN
  Administered 2024-07-01: 4 mL

## 2024-07-01 MED ORDER — TRIAMCINOLONE ACETONIDE 40 MG/ML IJ SUSP
80.0000 mg | INTRAMUSCULAR | Status: AC | PRN
  Administered 2024-07-01: 80 mg via INTRA_ARTICULAR

## 2024-07-01 NOTE — Progress Notes (Signed)
 Post Operative Evaluation    Procedure/Date of Surgery: Status post left knee matrix associated chondrocyte implantation 7/8  Interval History:    Presents 5 weeks status post above procedure overall doing extremely well.  Unfortunately her right knee has been swollen and more painful as she is rehabbing the left   PMH/PSH/Family History/Social History/Meds/Allergies:    Past Medical History:  Diagnosis Date  . Acute injury of left knee cartilage 05/24/2024  . ADD (attention deficit disorder with hyperactivity)   . Allergy    . Anxiety   . Arthritis   . Atypical chest pain 07/26/2021   Atypical chest pain    . Benign intracranial hypertension 06/16/2008   Qualifier: Diagnosis of   By: Zimonjic, Milica        . Choking    due to food and pills get stuck  . Cholelithiasis and acute cholecystitis without obstruction 12/02/2016  . Chronic cough 12/27/2013  . Depression   . Dry mouth 08/12/2017  . Dysmenorrhea   . Family history of adverse reaction to anesthesia    pt's father has hx. of being hard to wake up post-op  . Gastric ulcer 03/16/2015  . GERD (gastroesophageal reflux disease)   . Headache 07/09/2007   Qualifier: Diagnosis of   By: Tammie MD, Bruce      Replacing diagnoses that were inactivated after the 02/16/23 regulatory import    . History of gastric ulcer   . History of kidney stones   . Hyperlipidemia   . IBS (irritable bowel syndrome)   . Lower extremity edema 07/26/2021   Lower extremity edema    . Migraines    neurologist-  dr c. hagen ([facility] in [place])-- treated with Emgality  injection every 30 days  . Mild obstructive sleep apnea    per study in epic 03-21-2017 mild osa, recommendation cpap, mouth appliance, wt loss  . Nausea and vomiting 12/03/2016  . OAB (overactive bladder) 08/12/2017  . Osteoarthritis    right ankle  . Osteochondral lesion 09/2016   right ankle  . Osteomyelitis of knee  region [facility])   . Peripheral vascular disease (HCC)    NERVE DAMAGE RIGHT LEG AND FOOT DUE TO INJURGY.   SABRA Plantar fasciitis of left foot   . Pleural effusion 12/15/2013  . PONV (postoperative nausea and vomiting)    AND HEADACHES  . Pseudotumor cerebri    at back head  . RUQ pain   . S/P dilatation of esophageal stricture 07/2018  . Sinusitis 08/12/2018   RESOLVED WITH ANTIOBIOTIC  . SUI (stress urinary incontinence, female)   . Wears contact lenses   . Wears partial dentures    upper   Past Surgical History:  Procedure Laterality Date  . ANKLE ARTHROSCOPY Right 10/22/2016   Procedure: ANKLE ARTHROSCOPY;  Surgeon: Donnice JONELLE Fees, DPM;  Location: [facility];  Service: Podiatry;  Laterality: Right;  GENERAL/REG BLOCK  . ANKLE ARTHROSCOPY Right 09-15-2017   @[facility]   . BLADDER SURGERY  child   x 2 - as a child to stretch bladder  . CESAREAN SECTION  12/24/2009  . CESAREAN SECTION  11/20/2011   Procedure: CESAREAN SECTION;  Surgeon: Charlie JINNY Flowers, MD;  Location: [facility];  Service: Gynecology;  Laterality: N/A;  . CHOLECYSTECTOMY N/A 12/03/2016   Procedure: LAPAROSCOPIC CHOLECYSTECTOMY WITH  INTRAOPERATIVE CHOLANGIOGRAM;  Surgeon: Krystal Spinner, MD;  Location: [facility];  Service: General;  Laterality: N/A;  . CHONDROPLASTY Right 01/12/2024   Procedure: RIGHT KNEE ARTHROSCOPY LATERAL FEMORAL CHONDROPLASTY;  Surgeon: Genelle Standing, MD;  Location: [facility];  Service: Orthopedics;  Laterality: Right;  . CYSTOSCOPY WITH RETROGRADE PYELOGRAM, URETEROSCOPY AND STENT PLACEMENT Left 08/10/2006  . ESOPHAGOGASTRODUODENOSCOPY (EGD) WITH ESOPHAGEAL DILATION  07/2018  . HARDWARE REMOVAL Right 07/01/2016   Procedure: HARDWARE REMOVAL;  Surgeon: Glendia Cordella Hutchinson, MD;  Location: [facility];  Service: Orthopedics;  Laterality: Right;  . HARVEST BONE GRAFT Right 01/12/2024   Procedure: RIGHT BONE MARROW ASPIRATE FROM ILIAC CREST DECOMPRESSION AND PLACEMENT RIGHT LATERAL FEMORAL  CONDYLE;  Surgeon: Genelle Standing, MD;  Location: [facility];  Service: Orthopedics;  Laterality: Right;  . IMPLANTATION, MATRIX-INDUCED AUTOLOGOUS CHONDROCYTES Left 05/24/2024   Procedure: IMPLANTATION, MATRIX-INDUCED AUTOLOGOUS CHONDROCYTES;  Surgeon: Genelle Standing, MD;  Location: [facility];  Service: Orthopedics;  Laterality: Left;  LEFT KNEE MATRIX ASSOCIATED CHONDROCYTE IMPLANTATION  . KNEE ARTHROSCOPY Left 05/24/2024   Procedure: ARTHROSCOPY, KNEE;  Surgeon: Genelle Standing, MD;  Location: [facility];  Service: Orthopedics;  Laterality: Left;  . KNEE ARTHROSCOPY WITH LATERAL RELEASE Right 12/13/2014   Procedure: KNEE ARTHROSCOPY WITH LATERAL RELEASE;  Surgeon: LELON JONETTA Shari Mickey., MD;  Location: [facility];  Service: Orthopedics;  Laterality: Right;  . LAPAROSCOPIC APPENDECTOMY N/A 06/11/2014   Procedure: APPENDECTOMY LAPAROSCOPIC;  Surgeon: Krystal CHRISTELLA Spinner, MD;  Location: [facility];  Service: General;  Laterality: N/A;  . ORIF ANKLE FRACTURE Right 01/05/2016   Procedure: OPEN REDUCTION INTERNAL FIXATION (ORIF) ANKLE FRACTURE;  Surgeon: Glendia Cordella Hutchinson, MD;  Location: [facility];  Service: Orthopedics;  Laterality: Right;  . OVARIAN CYST SURGERY Right 03/05/2007   AND CHROMOPERTUBATION  VIA LAPAROSCOPY  . ROBOTIC ASSISTED LAPAROSCOPIC HYSTERECTOMY AND SALPINGECTOMY Bilateral 08/27/2018   Procedure: XI ROBOTIC ASSISTED LAPAROSCOPIC HYSTERECTOMY AND BILATERAL SALPINGECTOMY;  Surgeon: Gorge Ade, MD;  Location: [facility];  Service: Gynecology;  Laterality: Bilateral;  [facility] for 23hr OBS  . SYNOVECTOMY Right 12/13/2014   Procedure: PLICA SYNOVECTOMY;  Surgeon: LELON JONETTA Shari Mickey., MD;  Location:  SURGERY CENTER;  Service: Orthopedics;  Laterality: Right;  . TONSILLECTOMY AND ADENOIDECTOMY  child  . WISDOM TOOTH EXTRACTION     Social History   Socioeconomic History  . Marital status: Married    Spouse name: Not on file  . Number of  children: 2  . Years of education: Not on file  . Highest education level: Not on file  Occupational History  . Occupation: Investment banker, 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  Tobacco Use  . Smoking status: Former    Current packs/day: 0.50    Average packs/day: 0.5 packs/day for 15.0 years (7.5 ttl pk-yrs)    Types: Cigarettes  . Smokeless tobacco: Never  Vaping Use  . Vaping status: Every Day  . Substances: Nicotine   Substance and Sexual Activity  . Alcohol use: Yes    Comment: occasional  . Drug use: No  . Sexual activity: Yes    Birth control/protection: Surgical    Comment: partial hysterectomy  Other Topics Concern  . Not on file  Social History Narrative  . Not on file   Social Drivers of Health   Financial Resource Strain: Not on file  Food Insecurity: Not on file  Transportation Needs: Not on file  Physical Activity: Not on file  Stress: Not on file  Social Connections: Unknown (03/26/2022)   Received from [facility]   Social Network   . Social Network: Not on file   Family History  Problem Relation Age of Onset  . Learning disabilities Mother   . Asthma Mother   . Arthritis Mother   . Irritable bowel syndrome Mother   . Miscarriages / [place] Mother   . Hypertension Father   . Early death Father   . Drug abuse Father   . Depression Father   . Cancer Father   . Arthritis Father   . Alcohol abuse Father   . Brain cancer Father   . Anesthesia problems Father        hard to wake up post-op  . Lung cancer Father   . Hyperlipidemia Maternal Grandmother   . Hypertension Maternal Grandmother   . Cancer Maternal Grandmother   . Asthma Maternal Grandmother   . Arthritis Maternal Grandmother   . Breast cancer Maternal Grandmother   . Ovarian cancer Maternal Grandmother   . Diabetes Maternal Grandmother   . Heart disease Maternal Grandmother   . Heart attack Maternal Grandmother   . Hypertension Maternal Grandfather   . Hyperlipidemia  Maternal Grandfather   . Arthritis Maternal Grandfather   . Alcohol abuse Maternal Grandfather   . Cancer Maternal Grandfather   . Prostate cancer Maternal Grandfather   . Melanoma Maternal Grandfather   . Cancer - Prostate Maternal Grandfather   . Hypertension Paternal Grandmother   . COPD Paternal Grandmother   . Arthritis Paternal Grandmother   . Learning disabilities Daughter   . Learning disabilities Son   . Colon cancer Neg Hx    Allergies  Allergen Reactions  . Droperidol  Other (See Comments)    Jittery and restlessness  . Metoclopramide  Other (See Comments)    Reglan   . Nickel Rash    any jewelry that's not real/ rash and itching   . Prochlorperazine Other (See Comments)  . Nsaids Other (See Comments)    DUE TO HISTORY OF GASTRIC ULCER  . Tolmetin Itching and Other (See Comments)    DUE TO HISTORY OF GASTRIC ULCER  DUE TO HISTORY OF GASTRIC ULCER  DUE TO HISTORY OF GASTRIC ULCER, DUE TO HISTORY OF GASTRIC ULCER  . Oxybutynin  Other (See Comments)    severe dry mouth  . Adhesive [Tape] Rash  . Metoclopramide  Hcl Other (See Comments)    RESTLESSNESS/JITTERY  . Prochlorperazine Edisylate Other (See Comments)    RESTLESSNESS/JITTERY   . Sulfa Antibiotics Rash   Current Outpatient Medications  Medication Sig Dispense Refill  . ADDERALL XR 20 MG 24 hr capsule Take 20 mg by mouth every morning.  0  . albuterol  (PROVENTIL  HFA;VENTOLIN  HFA) 108 (90 Base) MCG/ACT inhaler Inhale 2 puffs into the lungs every 6 (six) hours as needed for wheezing or shortness of breath.     . ALPRAZolam (XANAX) 0.25 MG tablet SMARTSIG:0.5 Tablet(s) By Mouth PRN    . aspirin  EC 325 MG tablet Take 1 tablet (325 mg total) by mouth daily. 14 tablet 0  . atorvastatin  (LIPITOR) 40 MG tablet TAKE 1 TABLET BY MOUTH EVERY DAY 90 tablet 1  . Bacillus Coagulans-Inulin (PROBIOTIC) 1-250 BILLION-MG CAPS     . cetirizine (ZYRTEC) 10 MG tablet Take by mouth.    . diclofenac  Sodium (VOLTAREN ) 1 % GEL  APPLY TO AFFECTED AREA AS INSTRUCTED    . EMGALITY  120 MG/ML SOAJ Inject 120 mg into the skin every 30 (thirty)  days. 3 mL 4  . estradiol  (VIVELLE -DOT) 0.05 MG/24HR patch Place 1 patch (0.05 mg total) onto the skin 2 (two) times a week. 8 patch 12  . Fezolinetant  (VEOZAH ) 45 MG TABS Take 1 tablet (45 mg total) by mouth daily at 6 (six) AM. 30 tablet 11  . fluconazole  (DIFLUCAN ) 100 MG tablet Take 1 tablet (100 mg total) by mouth daily. 30 tablet 2  . fluconazole  (DIFLUCAN ) 150 MG tablet Take 1 tablet (150 mg total) by mouth every three (3) days as needed. 2 tablet 0  . fluticasone  (FLONASE ) 50 MCG/ACT nasal spray Place 2 sprays into both nostrils daily. 16 g 6  . gabapentin  (NEURONTIN ) 300 MG capsule TAKE 2 CAPSULES BY MOUTH 3 TIMES DAILY (Patient taking differently: Take 600-1,200 mg by mouth See admin instructions. Take 600mg  by mouth in the morning and 1,200mg  in the evening) 180 capsule 4  . lubiprostone (AMITIZA) 24 MCG capsule TAKE ONE CAPSULE (24 MCG DOSE) BY MOUTH 2 (TWO) TIMES DAILY WITH MEALS. 180 capsule 1  . magnesium  oxide (MAG-OX) 400 MG tablet Take by mouth.    . MELATONIN GUMMIES PO Take 1-2 each by mouth at bedtime.    . mirtazapine  (REMERON ) 15 MG tablet Take 1 tablet (15 mg total) by mouth daily. 90 tablet 3  . Multiple Vitamins-Minerals (EQL CENTURY WOMENS) TABS     . NUCYNTA 100 MG TABS Take 1 tablet by mouth every 8 (eight) hours as needed.    SABRA omeprazole (PRILOSEC) 40 MG capsule Take 40 mg by mouth daily.    . ondansetron  (ZOFRAN -ODT) 8 MG disintegrating tablet Take 1 tablet (8 mg total) by mouth every 8 (eight) hours as needed. 60 tablet 11  . oxyCODONE  (ROXICODONE ) 5 MG immediate release tablet Take 1 tablet (5 mg total) by mouth every 4 (four) hours as needed for severe pain (pain score 7-10) or breakthrough pain. 10 tablet 0  . PARoxetine  (PAXIL ) 10 MG tablet Take 1 tablet (10 mg total) by mouth daily. Replaces Effexor , for hot flushes, switching out will be not feeling  good for a few days 90 tablet 4  . progesterone  (PROMETRIUM ) 100 MG capsule Take 3 capsules (300 mg total) by mouth daily. 270 capsule 4  . promethazine  (PHENERGAN ) 25 MG tablet Take 1 tablet (25 mg total) by mouth every 8 (eight) hours as needed for nausea or vomiting. 10 tablet 0  . senna-docusate (SENOKOT-S) 8.6-50 MG tablet Take 1 tablet by mouth daily. 10 tablet 0  . tirzepatide  (ZEPBOUND ) 2.5 MG/0.5ML Pen Inject 2.5 mg into the skin once a week. 2 mL 11  . tiZANidine  (ZANAFLEX ) 4 MG tablet Take 4 mg by mouth every 6 (six) hours as needed for muscle spasms.     . traZODone (DESYREL) 50 MG tablet Take 1-2 tablets by mouth at bedtime as needed.    . triamcinolone  cream (KENALOG ) 0.1 % Apply 1 Application topically 2 (two) times daily. 30 g 0  . venlafaxine  XR (EFFEXOR -XR) 150 MG 24 hr capsule Take 2 capsules (300 mg total) by mouth daily before breakfast. 60 capsule 11  . VEOZAH  45 MG TABS TAKE 1 TABLET (45 MG TOTAL) BY MOUTH DAILY AT 6 (SIX) AM. 30 tablet 11  . zonisamide (ZONEGRAN) 100 MG capsule Take 100 mg by mouth at bedtime.     No current facility-administered medications for this visit.   No results found.  Review of Systems:   A ROS was performed including pertinent positives and negatives as documented in  the HPI.   Musculoskeletal Exam:    Last menstrual period 08/26/2018.  Left knee with well-appearing incision.  There is no erythema or drainage.  Range of motion is from 0 to 45 degrees.  There is some quad atrophy with an effusion about the knee.  Distal neurosensory exam is intact  Imaging:      I personally reviewed and interpreted the radiographs.   Assessment:   5 weeks status post left knee matrix associated chondrocyte implantation overall doing extremely well.  Her right knee is flaring up at this time and as result I have recommended ultrasound-guided injection of the right knee with steroids  Plan :    - Return to clinic 4 weeks for  reassessment    Procedure Note  Patient: Diane [name]             Date of Birth: [birth year]           MRN: [id number]             Visit Date: 07/01/2024  Procedures: Visit Diagnoses:  1. Peripheral tear of lateral meniscus of left knee as current injury, initial encounter     Large Joint Inj: R knee on 07/01/2024 11:47 AM Indications: pain Details: 22 G 1.5 in needle, ultrasound-guided anterior approach  Arthrogram: No  Medications: 4 mL lidocaine  1 %; 80 mg triamcinolone  acetonide 40 MG/ML Outcome: tolerated well, no immediate complications Procedure, treatment alternatives, risks and benefits explained, specific risks discussed. Consent was given by the patient. Immediately prior to procedure a time out was called to verify the correct patient, procedure, equipment, support staff and site/side marked as required. Patient was prepped and draped in the usual sterile fashion.            I personally saw and evaluated the patient, and participated in the management and treatment plan.  Elspeth Parker, MD Attending Physician, Orthopedic Surgery  This document was dictated using Dragon voice recognition software. A reasonable attempt at proof reading has been made to minimize errors.

## 2024-07-04 ENCOUNTER — Encounter (HOSPITAL_BASED_OUTPATIENT_CLINIC_OR_DEPARTMENT_OTHER): Payer: Self-pay | Admitting: Orthopaedic Surgery

## 2024-07-06 ENCOUNTER — Encounter (HOSPITAL_BASED_OUTPATIENT_CLINIC_OR_DEPARTMENT_OTHER): Admitting: Orthopaedic Surgery

## 2024-07-11 ENCOUNTER — Encounter (HOSPITAL_BASED_OUTPATIENT_CLINIC_OR_DEPARTMENT_OTHER): Payer: Self-pay | Admitting: Orthopaedic Surgery

## 2024-07-11 ENCOUNTER — Ambulatory Visit (HOSPITAL_BASED_OUTPATIENT_CLINIC_OR_DEPARTMENT_OTHER): Payer: Self-pay

## 2024-07-11 ENCOUNTER — Encounter (HOSPITAL_BASED_OUTPATIENT_CLINIC_OR_DEPARTMENT_OTHER): Payer: Self-pay

## 2024-07-11 DIAGNOSIS — R600 Localized edema: Secondary | ICD-10-CM

## 2024-07-11 DIAGNOSIS — R262 Difficulty in walking, not elsewhere classified: Secondary | ICD-10-CM

## 2024-07-11 DIAGNOSIS — M6281 Muscle weakness (generalized): Secondary | ICD-10-CM

## 2024-07-11 DIAGNOSIS — R2689 Other abnormalities of gait and mobility: Secondary | ICD-10-CM

## 2024-07-11 DIAGNOSIS — M25562 Pain in left knee: Secondary | ICD-10-CM

## 2024-07-11 NOTE — Therapy (Signed)
 OUTPATIENT PHYSICAL THERAPY LOWER EXTREMITY TREATMENT   Patient Name: Diane [name] MRN: [id number] DOB:Dec 16, 1980, 43 y.o., female Today's Date: 07/11/2024  END OF SESSION:  PT End of Session - 07/11/24 1109     Visit Number 6    Number of Visits 21    Date for PT Re-Evaluation 07/12/24    Authorization Type [employer] 20 VL    PT Start Time 1107    PT Stop Time 1145    PT Time Calculation (min) 38 min    Activity Tolerance Patient tolerated treatment well    Behavior During Therapy WFL for tasks assessed/performed               Past Medical History:  Diagnosis Date   Acute injury of left knee cartilage 05/24/2024   ADD (attention deficit disorder with hyperactivity)    Allergy     Anxiety    Arthritis    Atypical chest pain 07/26/2021   Atypical chest pain     Benign intracranial hypertension 06/16/2008   Qualifier: Diagnosis of   By: Donzell Smolder         Choking    due to food and pills get stuck   Cholelithiasis and acute cholecystitis without obstruction 12/02/2016   Chronic cough 12/27/2013   Depression    Dry mouth 08/12/2017   Dysmenorrhea    Family history of adverse reaction to anesthesia    pt's father has hx. of being hard to wake up post-op   Gastric ulcer 03/16/2015   GERD (gastroesophageal reflux disease)    Headache 07/09/2007   Qualifier: Diagnosis of   By: Tammie MD, Bruce      Replacing diagnoses that were inactivated after the 02/16/23 regulatory import     History of gastric ulcer    History of kidney stones    Hyperlipidemia    IBS (irritable bowel syndrome)    Lower extremity edema 07/26/2021   Lower extremity edema     Migraines    neurologist-  dr c. hagen ([facility] in [place])-- treated with Emgality  injection every 30 days   Mild obstructive sleep apnea    per study in epic 03-21-2017 mild osa, recommendation cpap, mouth appliance, wt loss   Nausea and vomiting 12/03/2016   OAB (overactive bladder) 08/12/2017    Osteoarthritis    right ankle   Osteochondral lesion 09/2016   right ankle   Osteomyelitis of knee region [facility])    Peripheral vascular disease (HCC)    NERVE DAMAGE RIGHT LEG AND FOOT DUE TO INJURGY.    Plantar fasciitis of left foot    Pleural effusion 12/15/2013   PONV (postoperative nausea and vomiting)    AND HEADACHES   Pseudotumor cerebri    at back head   RUQ pain    S/P dilatation of esophageal stricture 07/2018   Sinusitis 08/12/2018   RESOLVED WITH ANTIOBIOTIC   SUI (stress urinary incontinence, female)    Wears contact lenses    Wears partial dentures    upper   Past Surgical History:  Procedure Laterality Date   ANKLE ARTHROSCOPY Right 10/22/2016   Procedure: ANKLE ARTHROSCOPY;  Surgeon: Donnice JONELLE Fees, DPM;  Location: [facility];  Service: Podiatry;  Laterality: Right;  GENERAL/REG BLOCK   ANKLE ARTHROSCOPY Right 09-15-2017   @[facility]    BLADDER SURGERY  child   x 2 - as a child to stretch bladder   CESAREAN SECTION  12/24/2009   CESAREAN SECTION  11/20/2011   Procedure:  CESAREAN SECTION;  Surgeon: Charlie JINNY Flowers, MD;  Location: [facility];  Service: Gynecology;  Laterality: N/A;   CHOLECYSTECTOMY N/A 12/03/2016   Procedure: LAPAROSCOPIC CHOLECYSTECTOMY WITH INTRAOPERATIVE CHOLANGIOGRAM;  Surgeon: Krystal Spinner, MD;  Location: [facility];  Service: General;  Laterality: N/A;   CHONDROPLASTY Right 01/12/2024   Procedure: RIGHT KNEE ARTHROSCOPY LATERAL FEMORAL CHONDROPLASTY;  Surgeon: Genelle Standing, MD;  Location: [facility];  Service: Orthopedics;  Laterality: Right;   CYSTOSCOPY WITH RETROGRADE PYELOGRAM, URETEROSCOPY AND STENT PLACEMENT Left 08/10/2006   ESOPHAGOGASTRODUODENOSCOPY (EGD) WITH ESOPHAGEAL DILATION  07/2018   HARDWARE REMOVAL Right 07/01/2016   Procedure: HARDWARE REMOVAL;  Surgeon: Glendia Cordella Hutchinson, MD;  Location: [facility];  Service: Orthopedics;  Laterality: Right;   HARVEST BONE GRAFT Right 01/12/2024   Procedure: RIGHT BONE  MARROW ASPIRATE FROM ILIAC CREST DECOMPRESSION AND PLACEMENT RIGHT LATERAL FEMORAL CONDYLE;  Surgeon: Genelle Standing, MD;  Location: [facility];  Service: Orthopedics;  Laterality: Right;   IMPLANTATION, MATRIX-INDUCED AUTOLOGOUS CHONDROCYTES Left 05/24/2024   Procedure: IMPLANTATION, MATRIX-INDUCED AUTOLOGOUS CHONDROCYTES;  Surgeon: Genelle Standing, MD;  Location: [facility];  Service: Orthopedics;  Laterality: Left;  LEFT KNEE MATRIX ASSOCIATED CHONDROCYTE IMPLANTATION   KNEE ARTHROSCOPY Left 05/24/2024   Procedure: ARTHROSCOPY, KNEE;  Surgeon: Genelle Standing, MD;  Location: [facility];  Service: Orthopedics;  Laterality: Left;   KNEE ARTHROSCOPY WITH LATERAL RELEASE Right 12/13/2014   Procedure: KNEE ARTHROSCOPY WITH LATERAL RELEASE;  Surgeon: LELON JONETTA Shari Mickey., MD;  Location: [facility];  Service: Orthopedics;  Laterality: Right;   LAPAROSCOPIC APPENDECTOMY N/A 06/11/2014   Procedure: APPENDECTOMY LAPAROSCOPIC;  Surgeon: Krystal CHRISTELLA Spinner, MD;  Location: [facility];  Service: General;  Laterality: N/A;   ORIF ANKLE FRACTURE Right 01/05/2016   Procedure: OPEN REDUCTION INTERNAL FIXATION (ORIF) ANKLE FRACTURE;  Surgeon: Glendia Cordella Hutchinson, MD;  Location: [facility];  Service: Orthopedics;  Laterality: Right;   OVARIAN CYST SURGERY Right 03/05/2007   AND CHROMOPERTUBATION  VIA LAPAROSCOPY   ROBOTIC ASSISTED LAPAROSCOPIC HYSTERECTOMY AND SALPINGECTOMY Bilateral 08/27/2018   Procedure: XI ROBOTIC ASSISTED LAPAROSCOPIC HYSTERECTOMY AND BILATERAL SALPINGECTOMY;  Surgeon: Flowers Charlie, MD;  Location: [facility];  Service: Gynecology;  Laterality: Bilateral;  [facility] for 23hr OBS   SYNOVECTOMY Right 12/13/2014   Procedure: PLICA SYNOVECTOMY;  Surgeon: LELON JONETTA Shari Mickey., MD;  Location: [facility];  Service: Orthopedics;  Laterality: Right;   TONSILLECTOMY AND ADENOIDECTOMY  child   WISDOM TOOTH EXTRACTION     Patient Active Problem List   Diagnosis Date  Noted   Flexural eczema 06/19/2024   Insomnia 06/19/2024   FH: breast cancer 06/19/2024   Chondromalacia of left patella 05/24/2024   Peripheral tear of lateral meniscus of left knee as current injury 05/24/2024   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 01/12/2024   AVN (avascular necrosis of bone) (HCC) 01/12/2024   Prediabetes 07/14/2023   Ankle swelling 07/14/2023   Menopausal syndrome (hot flushes) 07/13/2023   Hyperlipidemia 06/19/2023   Intractable pain 06/17/2023   Chronic constipation 06/17/2023   Other idiopathic scoliosis, lumbar region 06/17/2023   Dysphagia 08/12/2017   Osteochondral lesion 06/03/2017   OSA (obstructive sleep apnea) 03/24/2017   Ankle pain, chronic 01/05/2016   Knee pain, right, chronic 04/12/2015   Chronic migraine without aura 04/01/2013   Depressive disorder 10/02/2008   Opioid use disorder in remission 10/02/2008   back pain, chronic 03/28/2008   Obesity (BMI 30-39.9) 01/25/2008   Anxiety state 11/09/2007  PCP: Jesus Bernardino MATSU, MD   REFERRING PROVIDER: Genelle Standing, MD   REFERRING DIAG:  [id number] (ICD-10-CM) - Peripheral tear of lateral meniscus of left knee as current injury, initial encounter      THERAPY DIAG:  Left knee pain, unspecified chronicity  Muscle weakness (generalized)  Difficulty in walking, not elsewhere classified  Localized edema  Other abnormalities of gait and mobility  Rationale for Evaluation and Treatment: Rehabilitation  ONSET DATE: 04/05/2024 DOS for meniscal repair  05/24/2024 MACI procedure   PROCEDURE: 1. Left knee lateral meniscal repair with cartilage biopsy   SUBJECTIVE:   PROCEDURE: 1.  Left knee lateral meniscal repair Left MACI repair    SUBJECTIVE:    SUBJECTIVE STATEMENT:  Pt reports 5/10 pain level today. Has stopped using crutches a couple days ago which is going Had injection into R knee last week which helped her pain. States MD wants her to stay in brace locked in extension  due to knee buckling.     PERTINENT HISTORY: Anxiety, arthritis, depression, PVD, LBP  PAIN:  Are you having pain? Yes: NPRS scale: 6-7/10, 11/10 at worst  Pain location: left anterior knee surgical site Pain description: aching Aggravating factors: movement; transitions Relieving factors: rest   PRECAUTIONS: Knee   RED FLAGS: None      WEIGHT BEARING RESTRICTIONS: WBAT with brace locked in extension   FALLS:  Has patient fallen in last 6 months? No   LIVING ENVIRONMENT:  Lives with family- 2 children Staying with mother on couch for now  2 story home Crutches and walker    OCCUPATION:     PLOF: Independent   PATIENT GOALS: return to walking, return to exercise to help with her weight loss   OBJECTIVE:  Note: Objective measures were completed at Evaluation unless otherwise noted.   DIAGNOSTIC FINDINGS:  IMPRESSION: Mild chondromalacia of the lateral facet of the patella. No full-thickness cartilage defect is identified. There is a moderate-sized reactive joint effusion.   Abnormal signal in the anterior horn and anterior body of the lateral meniscus suggesting either an intrameniscal tear or dissecting intrameniscal cyst. Correlation for anterior lateral joint line pain. No displaced meniscal tear is present.   Electronically signed by: Norleen Satchel MD 03/10/2024 05:30 PM EDT RP Workstation: [id number]   PATIENT SURVEYS:  LEFS 20/80   COGNITION: Overall cognitive status: Within functional limits for tasks assessed                         SENSATION: WFL   EDEMA:  Mild into lateral and medial patellar pouches    POSTURE: No Significant postural limitations   PALPATION: No TTP noted around incision sites, clean and dry   LOWER EXTREMITY ROM:   Active ROM Right eval Left eval  Hip flexion      Hip extension      Hip abduction      Hip adduction      Hip internal rotation      Hip external rotation      Knee flexion  WNL 75  Knee  extension  WNL 0  Ankle dorsiflexion      Ankle plantarflexion      Ankle inversion      Ankle eversion       (Blank rows = not tested) *= pain/symptoms   LOWER EXTREMITY MMT:    deferred due to surgical precautions.  Able to perform single SLR without extensor lag     GAIT:  Distance walked: 77ft feet  Assistive device utilized: bilat axillary crutches Level of assistance: Modified independence Comments:  step through   TODAY'S TREATMENT:       DATE:                                                                                                                         07/11/24 PROM L knee Measured 118 deg active knee flexion Quad sets 2x10 with 5 sec hold SAQ x10 (uncomfortable grinding so did not continue) Supine SLR 2x10 Bridges 2x10  Sidelying hip abduction 2x10ea Partial LAQ with VMO squeeze 5 2x10   06/20/24 Quad sets 2x10 with 5 sec hold Glute sets with 5 sec hold 2x10 AAROM with strap to tolerance into flexion.  Seated heel to toe raises  7/22  Reviewed pt presentation, pain levels, HEP compliance, and response to prior Rx. Reviewed HEP.  Supine SLR 2x10 with brace locked and min assist from PT Quad sets 2x10 with 5 sec hold Glute sets with 5 sec hold 2x10 S/L hip abduction 3x10  Pt received gentle L knee flexion and extension PROM in supine per pt and tissue tolerance w/n protocol range. Supine Heel slides with strap AAROM x 10 reps w/n protocol range  L knee flexion AAROM:  90 deg   7/17 -bandage change (gauze and new ACE wrap) -quad sets 5 2x10 -SLR with clinician assist x5, x4 - bridge with LES on physioball 3x10 -s/l hip abduction 3x10 -gait in hall with brace and bil crutches x154ft    Previous: Bandage change- no s/s of infection (no Tegaderm used-pt is highly allergic);  gauze replaced and new compression bandage applied; pt able to use paper tape  There-ex: gentle PROM into all planes        PATIENT EDUCATION:  Education  details:  PT instructed she could perform glute sets at home.  protocol/precautions, protocol ROM limits, diagnosis, prognosis, anatomy, exercise progression, muscle firing,  HEP, POC  Person educated: Patient Education method: Explanation, Demonstration, verbal and tactile cues Education comprehension: verbalized understanding, returned demonstration, verbal cues required, and tactile cues required   HOME EXERCISE PROGRAM:  Access Code: [email or web address] URL: [email or web address] Date: 04/13/2024 Prepared by: Dale Call   ASSESSMENT:   CLINICAL IMPRESSION: Measured today at 118deg active knee flexion. Pt c/o bilateral grinding of patellofemoral joints which was especially noticeable with SAQ. Pt plans to schedule appt with Dr. Genelle to bring up her concerns. Pt fatigued with s/l hip abduction today. Added partial LAQ with VMO squeeze which she did have fasiculations with, but denied pain. Will progress as tolerated next session and monitor grinding at bil patellofemoral joints.      OBJECTIVE IMPAIRMENTS: Abnormal gait, decreased activity tolerance, decreased balance, decreased endurance, decreased mobility, difficulty walking, decreased ROM, decreased strength, increased edema, increased muscle spasms, impaired flexibility, improper body mechanics, and pain.    ACTIVITY LIMITATIONS: carrying, lifting, bending, standing, squatting, stairs, transfers, hygiene/grooming, locomotion level, and caring for others   PARTICIPATION  LIMITATIONS: meal prep, cleaning, laundry, shopping, community activity, and exercise   PERSONAL FACTORS: Fitness, Time since onset of injury/illness/exacerbation, past experiences, and 2+ comorbidites are also affecting patient's functional outcome.    REHAB POTENTIAL: Good   CLINICAL DECISION MAKING: Stable/uncomplicated   EVALUATION COMPLEXITY: Low     GOALS:     SHORT TERM GOALS: Target date: 07/22/2024         Pt will become independent with  HEP in order to demonstrate synthesis of PT education.  Baseline: Goal status: INITIAL   2.  Pt will have an at least 18  pt improvement in LEFS measure in order to demonstrate MCID improvement in daily function.   Baseline: Goal status: INITIAL   3.  Patient will demonstrate full L knee ROM for transfers, stairs, ambulation. Baseline:  Goal status: INITIAL       LONG TERM GOALS: Target date:   07/06/2024      Pt  will become independent with final HEP in order to demonstrate synthesis of PT education. Baseline:  Goal status: INITIAL   2.  Pt will have an at least 27 pt improvement in LEFS measure in order to demonstrate MCID improvement in daily function.   Baseline:  Goal status: INITIAL   3.  Pt will be able to lift/squat/hold >25 lbs in order to demonstrate functional improvement in lumbopelvic strength for return to light and normal ADL.  Baseline:  Goal status: INITIAL   4.  Pt will be able to demonstrate full reciprocal stair management without UE in order to demonstrate functional improvement in LE function for self-care and daily mobility.    Baseline:  Goal status: INITIAL   5.  Pt will be able to demonstrate at least 80% Quad Index in order to demonstrate functional improvement of surgical knee quad strength for advancing to next phase of protocol.    Baseline:  Goal status: INITIAL      PLAN:   PT FREQUENCY: 1-2x/week   PT DURATION: 12 weeks   PLANNED INTERVENTIONS: Therapeutic exercises, Therapeutic activity, Neuromuscular re-education, Balance training, Gait training, Patient/Family education, Joint manipulation, Joint mobilization, Stair training, Orthotic/Fit training, DME instructions, Aquatic Therapy, Dry Needling, Electrical stimulation, Spinal manipulation, Spinal mobilization, Cryotherapy, Moist heat, Compression bandaging, scar mobilization, Splintting, Taping, Traction, Ultrasound, Ionotophoresis 4mg /ml Dexamethasone , and Manual therapy   PLAN  FOR NEXT SESSION: f/u with HEP, progress per MACI protocol    Asberry Rodes, PTA  07/11/24  2:28 PM

## 2024-07-14 ENCOUNTER — Other Ambulatory Visit (HOSPITAL_BASED_OUTPATIENT_CLINIC_OR_DEPARTMENT_OTHER): Payer: Self-pay | Admitting: Orthopaedic Surgery

## 2024-07-14 ENCOUNTER — Encounter (HOSPITAL_BASED_OUTPATIENT_CLINIC_OR_DEPARTMENT_OTHER): Payer: Self-pay | Admitting: Orthopaedic Surgery

## 2024-07-14 ENCOUNTER — Ambulatory Visit (HOSPITAL_BASED_OUTPATIENT_CLINIC_OR_DEPARTMENT_OTHER): Admitting: Orthopaedic Surgery

## 2024-07-14 DIAGNOSIS — M25561 Pain in right knee: Secondary | ICD-10-CM

## 2024-07-14 MED ORDER — DICLOFENAC SODIUM 1 % EX GEL: 4.0000 g | Freq: Four times a day (QID) | CUTANEOUS | 3 refills | Status: DC

## 2024-07-14 NOTE — Progress Notes (Signed)
 Post Operative Evaluation    Procedure/Date of Surgery: Status post left knee matrix associated chondrocyte implantation 7/8  Interval History:    Presents 5 weeks status post above procedure overall doing extremely well.  The right side is now becoming more swollen and painful.  This has been persistently clicking and crunching.  PMH/PSH/Family History/Social History/Meds/Allergies:    Past Medical History:  Diagnosis Date   Acute injury of left knee cartilage 05/24/2024   ADD (attention deficit disorder with hyperactivity)    Allergy     Anxiety    Arthritis    Atypical chest pain 07/26/2021   Atypical chest pain     Benign intracranial hypertension 06/16/2008   Qualifier: Diagnosis of   By: Zimonjic, Milica         Choking    due to food and pills get stuck   Cholelithiasis and acute cholecystitis without obstruction 12/02/2016   Chronic cough 12/27/2013   Depression    Dry mouth 08/12/2017   Dysmenorrhea    Family history of adverse reaction to anesthesia    pt's father has hx. of being hard to wake up post-op   Gastric ulcer 03/16/2015   GERD (gastroesophageal reflux disease)    Headache 07/09/2007   Qualifier: Diagnosis of   By: Tammie MD, Bruce      Replacing diagnoses that were inactivated after the 02/16/23 regulatory import     History of gastric ulcer    History of kidney stones    Hyperlipidemia    IBS (irritable bowel syndrome)    Lower extremity edema 07/26/2021   Lower extremity edema     Migraines    neurologist-  dr c. hagen ([facility] in [place])-- treated with Emgality  injection every 30 days   Mild obstructive sleep apnea    per study in epic 03-21-2017 mild osa, recommendation cpap, mouth appliance, wt loss   Nausea and vomiting 12/03/2016   OAB (overactive bladder) 08/12/2017   Osteoarthritis    right ankle   Osteochondral lesion 09/2016   right ankle   Osteomyelitis of knee region [facility])     Peripheral vascular disease (HCC)    NERVE DAMAGE RIGHT LEG AND FOOT DUE TO INJURGY.    Plantar fasciitis of left foot    Pleural effusion 12/15/2013   PONV (postoperative nausea and vomiting)    AND HEADACHES   Pseudotumor cerebri    at back head   RUQ pain    S/P dilatation of esophageal stricture 07/2018   Sinusitis 08/12/2018   RESOLVED WITH ANTIOBIOTIC   SUI (stress urinary incontinence, female)    Wears contact lenses    Wears partial dentures    upper   Past Surgical History:  Procedure Laterality Date   ANKLE ARTHROSCOPY Right 10/22/2016   Procedure: ANKLE ARTHROSCOPY;  Surgeon: Donnice JONELLE Fees, DPM;  Location: [facility];  Service: Podiatry;  Laterality: Right;  GENERAL/REG BLOCK   ANKLE ARTHROSCOPY Right 09-15-2017   @[facility]    BLADDER SURGERY  child   x 2 - as a child to stretch bladder   CESAREAN SECTION  12/24/2009   CESAREAN SECTION  11/20/2011   Procedure: CESAREAN SECTION;  Surgeon: Charlie JINNY Flowers, MD;  Location: [facility];  Service: Gynecology;  Laterality: N/A;   CHOLECYSTECTOMY N/A 12/03/2016   Procedure: LAPAROSCOPIC CHOLECYSTECTOMY  WITH INTRAOPERATIVE CHOLANGIOGRAM;  Surgeon: Krystal Spinner, MD;  Location: [facility];  Service: General;  Laterality: N/A;   CHONDROPLASTY Right 01/12/2024   Procedure: RIGHT KNEE ARTHROSCOPY LATERAL FEMORAL CHONDROPLASTY;  Surgeon: Genelle Standing, MD;  Location: [facility];  Service: Orthopedics;  Laterality: Right;   CYSTOSCOPY WITH RETROGRADE PYELOGRAM, URETEROSCOPY AND STENT PLACEMENT Left 08/10/2006   ESOPHAGOGASTRODUODENOSCOPY (EGD) WITH ESOPHAGEAL DILATION  07/2018   HARDWARE REMOVAL Right 07/01/2016   Procedure: HARDWARE REMOVAL;  Surgeon: Glendia Cordella Hutchinson, MD;  Location: [facility];  Service: Orthopedics;  Laterality: Right;   HARVEST BONE GRAFT Right 01/12/2024   Procedure: RIGHT BONE MARROW ASPIRATE FROM ILIAC CREST DECOMPRESSION AND PLACEMENT RIGHT LATERAL FEMORAL CONDYLE;  Surgeon: Genelle Standing, MD;   Location: [facility];  Service: Orthopedics;  Laterality: Right;   IMPLANTATION, MATRIX-INDUCED AUTOLOGOUS CHONDROCYTES Left 05/24/2024   Procedure: IMPLANTATION, MATRIX-INDUCED AUTOLOGOUS CHONDROCYTES;  Surgeon: Genelle Standing, MD;  Location: [facility];  Service: Orthopedics;  Laterality: Left;  LEFT KNEE MATRIX ASSOCIATED CHONDROCYTE IMPLANTATION   KNEE ARTHROSCOPY Left 05/24/2024   Procedure: ARTHROSCOPY, KNEE;  Surgeon: Genelle Standing, MD;  Location: [facility];  Service: Orthopedics;  Laterality: Left;   KNEE ARTHROSCOPY WITH LATERAL RELEASE Right 12/13/2014   Procedure: KNEE ARTHROSCOPY WITH LATERAL RELEASE;  Surgeon: LELON JONETTA Shari Mickey., MD;  Location: [facility];  Service: Orthopedics;  Laterality: Right;   LAPAROSCOPIC APPENDECTOMY N/A 06/11/2014   Procedure: APPENDECTOMY LAPAROSCOPIC;  Surgeon: Krystal CHRISTELLA Spinner, MD;  Location: [facility];  Service: General;  Laterality: N/A;   ORIF ANKLE FRACTURE Right 01/05/2016   Procedure: OPEN REDUCTION INTERNAL FIXATION (ORIF) ANKLE FRACTURE;  Surgeon: Glendia Cordella Hutchinson, MD;  Location: [facility];  Service: Orthopedics;  Laterality: Right;   OVARIAN CYST SURGERY Right 03/05/2007   AND CHROMOPERTUBATION  VIA LAPAROSCOPY   ROBOTIC ASSISTED LAPAROSCOPIC HYSTERECTOMY AND SALPINGECTOMY Bilateral 08/27/2018   Procedure: XI ROBOTIC ASSISTED LAPAROSCOPIC HYSTERECTOMY AND BILATERAL SALPINGECTOMY;  Surgeon: Gorge Ade, MD;  Location: [facility];  Service: Gynecology;  Laterality: Bilateral;  [facility] for 23hr OBS   SYNOVECTOMY Right 12/13/2014   Procedure: PLICA SYNOVECTOMY;  Surgeon: LELON JONETTA Shari Mickey., MD;  Location: [facility];  Service: Orthopedics;  Laterality: Right;   TONSILLECTOMY AND ADENOIDECTOMY  child   WISDOM TOOTH EXTRACTION     Social History   Socioeconomic History   Marital status: Married    Spouse name: Not on file   Number of children: 2   Years of education: Not on file   Highest  education level: Not on file  Occupational History   Occupation: Investment banker, 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  Tobacco Use   Smoking status: Former    Current packs/day: 0.50    Average packs/day: 0.5 packs/day for 15.0 years (7.5 ttl pk-yrs)    Types: Cigarettes   Smokeless tobacco: Never  Vaping Use   Vaping status: Every Day   Substances: Nicotine   Substance and Sexual Activity   Alcohol use: Yes    Comment: occasional   Drug use: No   Sexual activity: Yes    Birth control/protection: Surgical    Comment: partial hysterectomy  Other Topics Concern   Not on file  Social History Narrative   Not on file   Social Drivers of Health   Financial Resource Strain: Not on file  Food Insecurity: Not on file  Transportation Needs: Not on file  Physical Activity: Not on file  Stress: Not on file  Social Connections: Unknown (03/26/2022)   [facility]   Social Network    Social Network: Not on file   Family History  Problem Relation Age of Onset   Learning disabilities Mother    Asthma Mother    Arthritis Mother    Irritable bowel syndrome Mother    Miscarriages / Stillbirths Mother    Hypertension Father    Early death Father    Drug abuse Father    Depression Father    Cancer Father    Arthritis Father    Alcohol abuse Father    Brain cancer Father    Anesthesia problems Father        hard to wake up post-op   Lung cancer Father    Hyperlipidemia Maternal Grandmother    Hypertension Maternal Grandmother    Cancer Maternal Grandmother    Asthma Maternal Grandmother    Arthritis Maternal Grandmother    Breast cancer Maternal Grandmother    Ovarian cancer Maternal Grandmother    Diabetes Maternal Grandmother    Heart disease Maternal Grandmother    Heart attack Maternal Grandmother    Hypertension Maternal Grandfather    Hyperlipidemia Maternal Grandfather    Arthritis Maternal Grandfather    Alcohol abuse Maternal Grandfather     Cancer Maternal Grandfather    Prostate cancer Maternal Grandfather    Melanoma Maternal Grandfather    Cancer - Prostate Maternal Grandfather    Hypertension Paternal Grandmother    COPD Paternal Grandmother    Arthritis Paternal Grandmother    Learning disabilities Daughter    Learning disabilities Son    Colon cancer Neg Hx    Allergies  Allergen Reactions   Droperidol  Other (See Comments)    Jittery and restlessness   Metoclopramide  Other (See Comments)    Reglan    Nickel Rash    any jewelry that's not real/ rash and itching    Prochlorperazine Other (See Comments)   Nsaids Other (See Comments)    DUE TO HISTORY OF GASTRIC ULCER   Tolmetin Itching and Other (See Comments)    DUE TO HISTORY OF GASTRIC ULCER  DUE TO HISTORY OF GASTRIC ULCER  DUE TO HISTORY OF GASTRIC ULCER, DUE TO HISTORY OF GASTRIC ULCER   Oxybutynin  Other (See Comments)    severe dry mouth   Adhesive [Tape] Rash   Metoclopramide  Hcl Other (See Comments)    RESTLESSNESS/JITTERY   Prochlorperazine Edisylate Other (See Comments)    RESTLESSNESS/JITTERY    Sulfa Antibiotics Rash   Current Outpatient Medications  Medication Sig Dispense Refill   ADDERALL XR 20 MG 24 hr capsule Take 20 mg by mouth every morning.  0   albuterol  (PROVENTIL  HFA;VENTOLIN  HFA) 108 (90 Base) MCG/ACT inhaler Inhale 2 puffs into the lungs every 6 (six) hours as needed for wheezing or shortness of breath.      ALPRAZolam (XANAX) 0.25 MG tablet SMARTSIG:0.5 Tablet(s) By Mouth PRN     aspirin  EC 325 MG tablet Take 1 tablet (325 mg total) by mouth daily. 14 tablet 0   atorvastatin  (LIPITOR) 40 MG tablet TAKE 1 TABLET BY MOUTH EVERY DAY 90 tablet 1   Bacillus Coagulans-Inulin (PROBIOTIC) 1-250 BILLION-MG CAPS      cetirizine (ZYRTEC) 10 MG tablet Take by mouth.     diclofenac  Sodium (VOLTAREN ) 1 % GEL APPLY TO AFFECTED AREA AS INSTRUCTED     EMGALITY  120 MG/ML SOAJ Inject 120 mg into the skin every 30 (thirty) days.  3 mL 4    estradiol  (VIVELLE -DOT) 0.05 MG/24HR patch Place 1 patch (0.05 mg total) onto the skin 2 (two) times a week. 8 patch 12   Fezolinetant  (VEOZAH ) 45 MG TABS Take 1 tablet (45 mg total) by mouth daily at 6 (six) AM. 30 tablet 11   fluconazole  (DIFLUCAN ) 100 MG tablet Take 1 tablet (100 mg total) by mouth daily. 30 tablet 2   fluconazole  (DIFLUCAN ) 150 MG tablet Take 1 tablet (150 mg total) by mouth every three (3) days as needed. 2 tablet 0   fluticasone  (FLONASE ) 50 MCG/ACT nasal spray Place 2 sprays into both nostrils daily. 16 g 6   gabapentin  (NEURONTIN ) 300 MG capsule TAKE 2 CAPSULES BY MOUTH 3 TIMES DAILY (Patient taking differently: Take 600-1,200 mg by mouth See admin instructions. Take 600mg  by mouth in the morning and 1,200mg  in the evening) 180 capsule 4   lubiprostone (AMITIZA) 24 MCG capsule TAKE ONE CAPSULE (24 MCG DOSE) BY MOUTH 2 (TWO) TIMES DAILY WITH MEALS. 180 capsule 1   magnesium  oxide (MAG-OX) 400 MG tablet Take by mouth.     MELATONIN GUMMIES PO Take 1-2 each by mouth at bedtime.     mirtazapine  (REMERON ) 15 MG tablet Take 1 tablet (15 mg total) by mouth daily. 90 tablet 3   Multiple Vitamins-Minerals (EQL CENTURY WOMENS) TABS      NUCYNTA 100 MG TABS Take 1 tablet by mouth every 8 (eight) hours as needed.     omeprazole (PRILOSEC) 40 MG capsule Take 40 mg by mouth daily.     ondansetron  (ZOFRAN -ODT) 8 MG disintegrating tablet Take 1 tablet (8 mg total) by mouth every 8 (eight) hours as needed. 60 tablet 11   oxyCODONE  (ROXICODONE ) 5 MG immediate release tablet Take 1 tablet (5 mg total) by mouth every 4 (four) hours as needed for severe pain (pain score 7-10) or breakthrough pain. 10 tablet 0   PARoxetine  (PAXIL ) 10 MG tablet Take 1 tablet (10 mg total) by mouth daily. Replaces Effexor , for hot flushes, switching out will be not feeling good for a few days 90 tablet 4   progesterone  (PROMETRIUM ) 100 MG capsule Take 3 capsules (300 mg total) by mouth daily. 270 capsule 4    promethazine  (PHENERGAN ) 25 MG tablet Take 1 tablet (25 mg total) by mouth every 8 (eight) hours as needed for nausea or vomiting. 10 tablet 0   senna-docusate (SENOKOT-S) 8.6-50 MG tablet Take 1 tablet by mouth daily. 10 tablet 0   tirzepatide  (ZEPBOUND ) 2.5 MG/0.5ML Pen Inject 2.5 mg into the skin once a week. 2 mL 11   tiZANidine  (ZANAFLEX ) 4 MG tablet Take 4 mg by mouth every 6 (six) hours as needed for muscle spasms.      traZODone (DESYREL) 50 MG tablet Take 1-2 tablets by mouth at bedtime as needed.     triamcinolone  cream (KENALOG ) 0.1 % Apply 1 Application topically 2 (two) times daily. 30 g 0   venlafaxine  XR (EFFEXOR -XR) 150 MG 24 hr capsule Take 2 capsules (300 mg total) by mouth daily before breakfast. 60 capsule 11   VEOZAH  45 MG TABS TAKE 1 TABLET (45 MG TOTAL) BY MOUTH DAILY AT 6 (SIX) AM. 30 tablet 11   zonisamide (ZONEGRAN) 100 MG capsule Take 100 mg by mouth at bedtime.     No current facility-administered medications for this visit.   No results found.  Review of Systems:   A ROS was performed including pertinent positives and negatives as documented in  the HPI.   Musculoskeletal Exam:    Last menstrual period 08/26/2018.  Left knee with well-appearing incision.  There is no erythema or drainage.  Range of motion is from 0 to 45 degrees.  There is some quad atrophy with an effusion about the knee.  Distal neurosensory exam is intact  Right knee with tenderness about the patellofemoral joint as well as lateral joint line.  There is some crepitus appreciated to range of motion from 0 to 130 degrees.  She does still have an effusion about the knee  Imaging:      I personally reviewed and interpreted the radiographs.   Assessment:   5 weeks status post left knee matrix associated chondrocyte implantation overall doing extremely well.  Her right knee is now symptomatic again despite previous microfracture with crepitus and pain with effusion and inability to progress.   At this time we will plan to obtain an MRI of her right knee and I will see her back to discuss results Plan :    - Plan for MRI right knee and follow-up discuss results      I personally saw and evaluated the patient, and participated in the management and treatment plan.  Diane Parker, MD Attending Physician, Orthopedic Surgery  This document was dictated using Dragon voice recognition software. A reasonable attempt at proof reading has been made to minimize errors.

## 2024-07-19 ENCOUNTER — Other Ambulatory Visit

## 2024-07-20 ENCOUNTER — Encounter: Payer: Self-pay | Admitting: Physician Assistant

## 2024-07-20 ENCOUNTER — Ambulatory Visit (INDEPENDENT_AMBULATORY_CARE_PROVIDER_SITE_OTHER): Admitting: Physician Assistant

## 2024-07-20 ENCOUNTER — Telehealth: Payer: Self-pay

## 2024-07-20 ENCOUNTER — Other Ambulatory Visit (HOSPITAL_BASED_OUTPATIENT_CLINIC_OR_DEPARTMENT_OTHER): Payer: Self-pay

## 2024-07-20 VITALS — BP 120/82 | HR 99 | Temp 97.7°F | Ht 67.0 in | Wt 250.4 lb

## 2024-07-20 DIAGNOSIS — R0981 Nasal congestion: Secondary | ICD-10-CM | POA: Diagnosis not present

## 2024-07-20 MED ORDER — FLUCONAZOLE 150 MG PO TABS: ORAL_TABLET | ORAL | 0 refills | Status: DC

## 2024-07-20 MED ORDER — AMOXICILLIN-POT CLAVULANATE 875-125 MG PO TABS: 1.0000 | ORAL_TABLET | Freq: Two times a day (BID) | ORAL | 0 refills | Status: DC

## 2024-07-20 NOTE — Progress Notes (Signed)
 Diane [name] is a 43 y.o. female here for a follow up of a pre-existing problem.  History of Present Illness:   Chief Complaint  Patient presents with   Sinus Problem    Pt c/o sinus congestion, yellow green drainage, headache x 3 days.    Discussed the use of AI scribe software for clinical note transcription with the patient, who gave verbal consent to proceed.  History of Present Illness [name] is a 43 year old female who presents with sinus symptoms and possible sinus infection.  She has experienced severe headache pain for the past three days, with difficulty clearing nasal passages and a crusty sensation in her nose. Over-the-counter medications have been used for symptom management. Wheezing occurs when lying down, described as a 'little whistle', with no history of asthma. She reports ear pain, popping, and crackling in one ear, along with a sore throat that is becoming severe. She has not taken antibiotics in over a month and tolerates Augmentin  without severe diarrhea.    Past Medical History:  Diagnosis Date   Acute injury of left knee cartilage 05/24/2024   ADD (attention deficit disorder with hyperactivity)    Allergy     Anxiety    Arthritis    Atypical chest pain 07/26/2021   Atypical chest pain     Benign intracranial hypertension 06/16/2008   Qualifier: Diagnosis of   By: Zimonjic, Milica         Choking    due to food and pills get stuck   Cholelithiasis and acute cholecystitis without obstruction 12/02/2016   Chronic cough 12/27/2013   Depression    Dry mouth 08/12/2017   Dysmenorrhea    Family history of adverse reaction to anesthesia    pt's father has hx. of being hard to wake up post-op   Gastric ulcer 03/16/2015   GERD (gastroesophageal reflux disease)    Headache 07/09/2007   Qualifier: Diagnosis of   By: Tammie MD, Bruce      Replacing diagnoses that were inactivated after the 02/16/23 regulatory import     History of gastric ulcer     History of kidney stones    Hyperlipidemia    IBS (irritable bowel syndrome)    Lower extremity edema 07/26/2021   Lower extremity edema     Migraines    neurologist-  dr c. hagen ([facility] in [place])-- treated with Emgality  injection every 30 days   Mild obstructive sleep apnea    per study in epic 03-21-2017 mild osa, recommendation cpap, mouth appliance, wt loss   Nausea and vomiting 12/03/2016   OAB (overactive bladder) 08/12/2017   Osteoarthritis    right ankle   Osteochondral lesion 09/2016   right ankle   Osteomyelitis of knee region [facility])    Peripheral vascular disease (HCC)    NERVE DAMAGE RIGHT LEG AND FOOT DUE TO INJURGY.    Plantar fasciitis of left foot    Pleural effusion 12/15/2013   PONV (postoperative nausea and vomiting)    AND HEADACHES   Pseudotumor cerebri    at back head   RUQ pain    S/P dilatation of esophageal stricture 07/2018   Sinusitis 08/12/2018   RESOLVED WITH ANTIOBIOTIC   SUI (stress urinary incontinence, female)    Wears contact lenses    Wears partial dentures    upper     Social History   Tobacco Use   Smoking status: Former    Current packs/day: 0.50  Average packs/day: 0.5 packs/day for 15.0 years (7.5 ttl pk-yrs)    Types: Cigarettes   Smokeless tobacco: Never  Vaping Use   Vaping status: Every Day   Substances: Nicotine   Substance Use Topics   Alcohol use: Yes    Comment: occasional   Drug use: No    Past Surgical History:  Procedure Laterality Date   ANKLE ARTHROSCOPY Right 10/22/2016   Procedure: ANKLE ARTHROSCOPY;  Surgeon: Donnice JONELLE Fees, DPM;  Location: [facility];  Service: Podiatry;  Laterality: Right;  GENERAL/REG BLOCK   ANKLE ARTHROSCOPY Right 09-15-2017   @[facility]    BLADDER SURGERY  child   x 2 - as a child to stretch bladder   CESAREAN SECTION  12/24/2009   CESAREAN SECTION  11/20/2011   Procedure: CESAREAN SECTION;  Surgeon: Charlie JINNY Flowers, MD;  Location: [facility];   Service: Gynecology;  Laterality: N/A;   CHOLECYSTECTOMY N/A 12/03/2016   Procedure: LAPAROSCOPIC CHOLECYSTECTOMY WITH INTRAOPERATIVE CHOLANGIOGRAM;  Surgeon: Krystal Spinner, MD;  Location: [facility];  Service: General;  Laterality: N/A;   CHONDROPLASTY Right 01/12/2024   Procedure: RIGHT KNEE ARTHROSCOPY LATERAL FEMORAL CHONDROPLASTY;  Surgeon: Genelle Standing, MD;  Location: [facility];  Service: Orthopedics;  Laterality: Right;   CYSTOSCOPY WITH RETROGRADE PYELOGRAM, URETEROSCOPY AND STENT PLACEMENT Left 08/10/2006   ESOPHAGOGASTRODUODENOSCOPY (EGD) WITH ESOPHAGEAL DILATION  07/2018   HARDWARE REMOVAL Right 07/01/2016   Procedure: HARDWARE REMOVAL;  Surgeon: Glendia Cordella Hutchinson, MD;  Location: [facility];  Service: Orthopedics;  Laterality: Right;   HARVEST BONE GRAFT Right 01/12/2024   Procedure: RIGHT BONE MARROW ASPIRATE FROM ILIAC CREST DECOMPRESSION AND PLACEMENT RIGHT LATERAL FEMORAL CONDYLE;  Surgeon: Genelle Standing, MD;  Location: [facility];  Service: Orthopedics;  Laterality: Right;   IMPLANTATION, MATRIX-INDUCED AUTOLOGOUS CHONDROCYTES Left 05/24/2024   Procedure: IMPLANTATION, MATRIX-INDUCED AUTOLOGOUS CHONDROCYTES;  Surgeon: Genelle Standing, MD;  Location:  [facility];  Service: Orthopedics;  Laterality: Left;  LEFT KNEE MATRIX ASSOCIATED CHONDROCYTE IMPLANTATION   KNEE ARTHROSCOPY Left 05/24/2024   Procedure: ARTHROSCOPY, KNEE;  Surgeon: Genelle Standing, MD;  Location: [facility];  Service: Orthopedics;  Laterality: Left;   KNEE ARTHROSCOPY WITH LATERAL RELEASE Right 12/13/2014   Procedure: KNEE ARTHROSCOPY WITH LATERAL RELEASE;  Surgeon: LELON JONETTA Shari Mickey., MD;  Location: [facility];  Service: Orthopedics;  Laterality: Right;   LAPAROSCOPIC APPENDECTOMY N/A 06/11/2014   Procedure: APPENDECTOMY LAPAROSCOPIC;  Surgeon: Krystal CHRISTELLA Spinner, MD;  Location: [facility];  Service: General;  Laterality: N/A;   ORIF ANKLE FRACTURE Right 01/05/2016    Procedure: OPEN REDUCTION INTERNAL FIXATION (ORIF) ANKLE FRACTURE;  Surgeon: Glendia Cordella Hutchinson, MD;  Location[facility];  Service: Orthopedics;  Laterality: Right;   OVARIAN CYST SURGERY Right 03/05/2007   AND CHROMOPERTUBATION  VIA LAPAROSCOPY   ROBOTIC ASSISTED LAPAROSCOPIC HYSTERECTOMY AND SALPINGECTOMY Bilateral 08/27/2018   Procedure: XI ROBOTIC ASSISTED LAPAROSCOPIC HYSTERECTOMY AND BILATERAL SALPINGECTOMY;  Surgeon: Flowers Charlie, MD;  Location: [facility];  Service: Gynecology;  Laterality: Bilateral;  [facility] for 23hr OBS   SYNOVECTOMY Right 12/13/2014   Procedure: PLICA SYNOVECTOMY;  Surgeon: LELON JONETTA Shari Mickey., MD;  Location: [facility];  Service: Orthopedics;  Laterality: Right;   TONSILLECTOMY AND ADENOIDECTOMY  child   WISDOM TOOTH EXTRACTION      Family History  Problem Relation Age of Onset   Learning disabilities Mother    Asthma Mother    Arthritis Mother    Irritable bowel syndrome Mother  Miscarriages / Stillbirths Mother    Hypertension Father    Early death Father    Drug abuse Father    Depression Father    Cancer Father    Arthritis Father    Alcohol abuse Father    Brain cancer Father    Anesthesia problems Father        hard to wake up post-op   Lung cancer Father    Hyperlipidemia Maternal Grandmother    Hypertension Maternal Grandmother    Cancer Maternal Grandmother    Asthma Maternal Grandmother    Arthritis Maternal Grandmother    Breast cancer Maternal Grandmother    Ovarian cancer Maternal Grandmother    Diabetes Maternal Grandmother    Heart disease Maternal Grandmother    Heart attack Maternal Grandmother    Hypertension Maternal Grandfather    Hyperlipidemia Maternal Grandfather    Arthritis Maternal Grandfather    Alcohol abuse Maternal Grandfather    Cancer Maternal Grandfather    Prostate cancer Maternal Grandfather    Melanoma Maternal Grandfather    Cancer - Prostate Maternal Grandfather    Hypertension Paternal Grandmother     COPD Paternal Grandmother    Arthritis Paternal Grandmother    Learning disabilities Daughter    Learning disabilities Son    Colon cancer Neg Hx     Allergies  Allergen Reactions   Droperidol  Other (See Comments)    Jittery and restlessness   Metoclopramide  Other (See Comments)    Reglan    Nickel Rash    any jewelry that's not real/ rash and itching    Prochlorperazine Other (See Comments)   Nsaids Other (See Comments)    DUE TO HISTORY OF GASTRIC ULCER   Tolmetin Itching and Other (See Comments)    DUE TO HISTORY OF GASTRIC ULCER  DUE TO HISTORY OF GASTRIC ULCER  DUE TO HISTORY OF GASTRIC ULCER, DUE TO HISTORY OF GASTRIC ULCER   Oxybutynin  Other (See Comments)    severe dry mouth   Adhesive [Tape] Rash   Metoclopramide  Hcl Other (See Comments)    RESTLESSNESS/JITTERY   Prochlorperazine Edisylate Other (See Comments)    RESTLESSNESS/JITTERY    Sulfa Antibiotics Rash    Current Medications:   Current Outpatient Medications:    ADDERALL XR 20 MG 24 hr capsule, Take 20 mg by mouth every morning., Disp: , Rfl: 0   albuterol  (PROVENTIL  HFA;VENTOLIN  HFA) 108 (90 Base) MCG/ACT inhaler, Inhale 2 puffs into the lungs every 6 (six) hours as needed for wheezing or shortness of breath. , Disp: , Rfl:    ALPRAZolam (XANAX) 0.25 MG tablet, SMARTSIG:0.5 Tablet(s) By Mouth PRN, Disp: , Rfl:    amoxicillin -clavulanate (AUGMENTIN ) 875-125 MG tablet, Take 1 tablet by mouth 2 (two) times daily., Disp: 14 tablet, Rfl: 0   aspirin  EC 325 MG tablet, Take 1 tablet (325 mg total) by mouth daily., Disp: 14 tablet, Rfl: 0   atorvastatin  (LIPITOR) 40 MG tablet, TAKE 1 TABLET BY MOUTH EVERY DAY, Disp: 90 tablet, Rfl: 1   Bacillus Coagulans-Inulin (PROBIOTIC) 1-250 BILLION-MG CAPS, , Disp: , Rfl:    cetirizine (ZYRTEC) 10 MG tablet, Take by mouth., Disp: , Rfl:    diclofenac  Sodium (VOLTAREN ) 1 % GEL, APPLY TO AFFECTED AREA AS INSTRUCTED, Disp: , Rfl:    diclofenac  Sodium (VOLTAREN ) 1 %  GEL, Apply 4 g topically 4 (four) times daily., Disp: 50 g, Rfl: 3   EMGALITY  120 MG/ML SOAJ, Inject 120 mg into the skin every 30 (thirty) days., Disp:  3 mL, Rfl: 4   estradiol  (VIVELLE -DOT) 0.05 MG/24HR patch, Place 1 patch (0.05 mg total) onto the skin 2 (two) times a week., Disp: 8 patch, Rfl: 12   Fezolinetant  (VEOZAH ) 45 MG TABS, Take 1 tablet (45 mg total) by mouth daily at 6 (six) AM., Disp: 30 tablet, Rfl: 11   fluconazole  (DIFLUCAN ) 150 MG tablet, Take 1 tablet by mouth. If symptoms persist, may take an additional tablet in 3-5 days. May repeat for a total of 3 tablets., Disp: 3 tablet, Rfl: 0   fluticasone  (FLONASE ) 50 MCG/ACT nasal spray, Place 2 sprays into both nostrils daily., Disp: 16 g, Rfl: 6   gabapentin  (NEURONTIN ) 300 MG capsule, TAKE 2 CAPSULES BY MOUTH 3 TIMES DAILY (Patient taking differently: Take 600-1,200 mg by mouth See admin instructions. Take 600mg  by mouth in the morning and 1,200mg  in the evening), Disp: 180 capsule, Rfl: 4   lubiprostone (AMITIZA) 24 MCG capsule, TAKE ONE CAPSULE (24 MCG DOSE) BY MOUTH 2 (TWO) TIMES DAILY WITH MEALS., Disp: 180 capsule, Rfl: 1   magnesium  oxide (MAG-OX) 400 MG tablet, Take by mouth., Disp: , Rfl:    MELATONIN GUMMIES PO, Take 1-2 each by mouth at bedtime., Disp: , Rfl:    mirtazapine  (REMERON ) 15 MG tablet, Take 1 tablet (15 mg total) by mouth daily., Disp: 90 tablet, Rfl: 3   Multiple Vitamins-Minerals (EQL [facility]ENT[facility] WOMENS) TABS, , Disp: , Rfl:    NUCYNTA 100 MG TABS, Take 1 tablet by mouth every 8 (eight) hours as needed., Disp: , Rfl:    omeprazole (PRILOSEC) 40 MG capsule, Take 40 mg by mouth daily., Disp: , Rfl:    ondansetron  (ZOFRAN -ODT) 8 MG disintegrating tablet, Take 1 tablet (8 mg total) by mouth every 8 (eight) hours as needed., Disp: 60 tablet, Rfl: 11   PARoxetine  (PAXIL ) 10 MG tablet, Take 1 tablet (10 mg total) by mouth daily. Replaces Effexor , for hot flushes, switching out will be not feeling good for a few days  (Patient taking differently: Take 5 mg by mouth daily. Replaces Effexor , for hot flushes, switching out will be not feeling good for a few days), Disp: 90 tablet, Rfl: 4   progesterone  (PROMETRIUM ) 100 MG capsule, Take 3 capsules (300 mg total) by mouth daily., Disp: 270 capsule, Rfl: 4   promethazine  (PHENERGAN ) 25 MG tablet, Take 1 tablet (25 mg total) by mouth every 8 (eight) hours as needed for nausea or vomiting., Disp: 10 tablet, Rfl: 0   senna-docusate (SENOKOT-S) 8.6-50 MG tablet, Take 1 tablet by mouth daily., Disp: 10 tablet, Rfl: 0   tiZANidine  (ZANAFLEX ) 4 MG tablet, Take 4 mg by mouth every 6 (six) hours as needed for muscle spasms. , Disp: , Rfl:    topiramate  (TOPAMAX ) 25 MG tablet, Take 25 mg by mouth daily., Disp: , Rfl:    traZODone (DESYREL) 50 MG tablet, Take 1-2 tablets by mouth at bedtime as needed., Disp: , Rfl:    triamcinolone  cream (KENALOG ) 0.1 %, Apply 1 Application topically 2 (two) times daily., Disp: 30 g, Rfl: 0   venlafaxine  XR (EFFEXOR -XR) 150 MG 24 hr capsule, Take 2 capsules (300 mg total) by mouth daily before breakfast., Disp: 60 capsule, Rfl: 11   zonisamide (ZONEGRAN) 100 MG capsule, Take 100 mg by mouth at bedtime., Disp: , Rfl:    Review of Systems:   Negative unless otherwise specified per HPI.  Vitals:   Vitals:   07/20/24 1250  BP: 120/82  Pulse: 99  Temp: 97.7  F (36.5 C)  TempSrc: Temporal  SpO2: 98%  Weight: 250 lb 6.1 oz (113.6 kg)  Height: 5' 7 (1.702 m)     Body mass index is 39.22 kg/m.  Physical Exam:   Physical Exam Vitals and nursing note reviewed.  Constitutional:      General: She is not in acute distress.    Appearance: She is well-developed. She is not ill-appearing or toxic-appearing.  HENT:     Head: Normocephalic and atraumatic.     Right Ear: Tympanic membrane, ear canal and external ear normal. Tympanic membrane is not erythematous, retracted or bulging.     Left Ear: Tympanic membrane, ear canal and external  ear normal. Tympanic membrane is not erythematous, retracted or bulging.     Nose:     Right Sinus: Maxillary sinus tenderness and frontal sinus tenderness present.     Left Sinus: Maxillary sinus tenderness and frontal sinus tenderness present.     Mouth/Throat:     Pharynx: Uvula midline. No posterior oropharyngeal erythema.  Eyes:     General: Lids are normal.     Conjunctiva/sclera: Conjunctivae normal.  Neck:     Trachea: Trachea normal.  Cardiovascular:     Rate and Rhythm: Normal rate and regular rhythm.     Heart sounds: Normal heart sounds, S1 normal and S2 normal.  Pulmonary:     Effort: Pulmonary effort is normal.     Breath sounds: Normal breath sounds. No decreased breath sounds, wheezing, rhonchi or rales.  Lymphadenopathy:     Cervical: No cervical adenopathy.  Skin:    General: Skin is warm and dry.  Neurological:     Mental Status: She is alert.  Psychiatric:        Speech: Speech normal.        Behavior: Behavior normal. Behavior is cooperative.     Assessment and Plan:   Assessment and Plan Assessment & Plan Sinus congestion Symptoms for three days include severe headache and nasal congestion. - Prescribed Augmentin  for sinusitis due to history of sinus infections and symptom(s) consistent with past sinusitis - Prescribed Diflucan  to prevent yeast infection due to antibiotic use.  Recommend follow up if new/worsening/lack of improvement     Lucie Buttner, PA-C

## 2024-07-20 NOTE — Telephone Encounter (Signed)
 Copied from CRM #[id number]. Topic: General - Running Late >> Jul 20, 2024 12:18 PM Diane [name] wrote: Patient is calling because they are running late for an appointment, she got pulled over and informed that she will be about 10 minutes behind schedule, but she is still on her way at this time.    PCP/Clinic Please Advise.  Pt was seen and has done left

## 2024-07-21 ENCOUNTER — Ambulatory Visit (HOSPITAL_BASED_OUTPATIENT_CLINIC_OR_DEPARTMENT_OTHER): Admitting: Physical Therapy

## 2024-07-25 ENCOUNTER — Ambulatory Visit
Admission: RE | Admit: 2024-07-25 | Discharge: 2024-07-25 | Disposition: A | Discharge status: Home / Self Care | Source: Ambulatory Visit | Attending: Orthopaedic Surgery | Admitting: Orthopaedic Surgery

## 2024-07-25 DIAGNOSIS — M25561 Pain in right knee: Secondary | ICD-10-CM

## 2024-07-28 ENCOUNTER — Ambulatory Visit: Payer: Self-pay

## 2024-07-28 ENCOUNTER — Encounter: Payer: Self-pay | Admitting: Internal Medicine

## 2024-07-28 NOTE — Telephone Encounter (Signed)
 FYI Only or Action Required?: FYI only for provider.  Patient was last seen in primary care on 07/20/2024 by Job Lukes, PA.  Called Nurse Triage reporting Dizziness.  Symptoms began yesterday.  Interventions attempted: Rest, hydration, or home remedies.  Symptoms are: unchanged.  Triage Disposition: See Physician Within 24 Hours  Patient/caregiver understands and will follow disposition?: Yes  **Appointment scheduled for 9/12**          Copied from CRM #[id number]. Topic: Clinical - Red Word Triage >> Jul 28, 2024  1:00 PM Burnard DEL wrote: Red Word that prompted transfer to Nurse Triage: dizziness,pulsing headache Reason for Disposition  [1] MODERATE dizziness (e.g., vertigo; feels very unsteady, interferes with normal activities) AND [2] has NOT been evaluated by doctor (or NP/PA) for this  Answer Assessment - Initial Assessment Questions 1. DESCRIPTION: Describe your dizziness.     She feels off balance  2. VERTIGO: Do you feel like either you or the room is spinning or tilting?      Room spinning   3. LIGHTHEADED: Do you feel lightheaded? (e.g., somewhat faint, woozy, weak upon standing)     No   4. SEVERITY: How bad is it?  Can you walk?     Moderate currently, headache pain is a 7/10  5. ONSET:  When did the dizziness begin?     X 1 day  6. AGGRAVATING FACTORS: Does anything make it worse? (e.g., standing, change in head position)     Standing   7. CAUSE: What do you think is causing the dizziness?     Unknown   8. RECURRENT SYMPTOM: Have you had dizziness before? If Yes, ask: When was the last time? What happened that time?     No   9. OTHER SYMPTOMS: Do you have any other symptoms? (e.g., earache, headache, numbness, tinnitus, vomiting, weakness)  Headache, hx. Of migraines.  Protocols used: Dizziness - Vertigo-A-AH

## 2024-07-29 ENCOUNTER — Ambulatory Visit (INDEPENDENT_AMBULATORY_CARE_PROVIDER_SITE_OTHER): Admitting: Internal Medicine

## 2024-07-29 ENCOUNTER — Other Ambulatory Visit: Payer: Self-pay

## 2024-07-29 ENCOUNTER — Emergency Department (HOSPITAL_COMMUNITY)

## 2024-07-29 ENCOUNTER — Encounter: Payer: Self-pay | Admitting: Internal Medicine

## 2024-07-29 ENCOUNTER — Ambulatory Visit: Admitting: Family Medicine

## 2024-07-29 ENCOUNTER — Emergency Department (HOSPITAL_COMMUNITY)
Admission: EM | Admit: 2024-07-29 | Discharge: 2024-07-30 | Disposition: A | Discharge status: Home / Self Care | Attending: Emergency Medicine | Admitting: Emergency Medicine

## 2024-07-29 ENCOUNTER — Telehealth: Payer: Self-pay | Admitting: Internal Medicine

## 2024-07-29 ENCOUNTER — Encounter (HOSPITAL_COMMUNITY): Payer: Self-pay

## 2024-07-29 VITALS — BP 100/76 | HR 118 | Temp 98.0°F | Ht 67.0 in | Wt 250.4 lb

## 2024-07-29 DIAGNOSIS — R42 Dizziness and giddiness: Secondary | ICD-10-CM | POA: Insufficient documentation

## 2024-07-29 DIAGNOSIS — Z8249 Family history of ischemic heart disease and other diseases of the circulatory system: Secondary | ICD-10-CM | POA: Diagnosis not present

## 2024-07-29 DIAGNOSIS — G4453 Primary thunderclap headache: Secondary | ICD-10-CM | POA: Diagnosis not present

## 2024-07-29 DIAGNOSIS — R000 Tachycardia, unspecified: Secondary | ICD-10-CM

## 2024-07-29 DIAGNOSIS — R519 Headache, unspecified: Secondary | ICD-10-CM | POA: Diagnosis not present

## 2024-07-29 LAB — CBC WITH DIFFERENTIAL/PLATELET
Abs Immature Granulocytes: 0.02 K/uL (ref 0.00–0.07)
Basophils Absolute: 0.1 K/uL (ref 0.0–0.1)
Basophils Relative: 1 %
Eosinophils Absolute: 0.4 K/uL (ref 0.0–0.5)
Eosinophils Relative: 4 %
HCT: 38.7 % (ref 36.0–46.0)
Hemoglobin: 12.1 g/dL (ref 12.0–15.0)
Immature Granulocytes: 0 %
Lymphocytes Relative: 35 %
Lymphs Abs: 3 K/uL (ref 0.7–4.0)
MCH: 26.9 pg (ref 26.0–34.0)
MCHC: 31.3 g/dL (ref 30.0–36.0)
MCV: 86.2 fL (ref 80.0–100.0)
Monocytes Absolute: 0.5 K/uL (ref 0.1–1.0)
Monocytes Relative: 6 %
Neutro Abs: 4.4 K/uL (ref 1.7–7.7)
Neutrophils Relative %: 54 %
Platelets: 362 K/uL (ref 150–400)
RBC: 4.49 MIL/uL (ref 3.87–5.11)
RDW: 14.6 % (ref 11.5–15.5)
WBC: 8.3 K/uL (ref 4.0–10.5)
nRBC: 0 % (ref 0.0–0.2)

## 2024-07-29 LAB — BASIC METABOLIC PANEL WITH GFR
Anion gap: 14 (ref 5–15)
BUN: 7 mg/dL (ref 6–20)
CO2: 21 mmol/L — ABNORMAL LOW (ref 22–32)
Calcium: 9.1 mg/dL (ref 8.9–10.3)
Chloride: 101 mmol/L (ref 98–111)
Creatinine, Ser: 0.86 mg/dL (ref 0.44–1.00)
GFR, Estimated: >60 mL/min (ref >60)
Glucose, Bld: 106 mg/dL — ABNORMAL HIGH (ref 70–99)
Potassium: 3.8 mmol/L (ref 3.5–5.1)
Sodium: 136 mmol/L (ref 135–145)

## 2024-07-29 MED ORDER — IOHEXOL 350 MG/ML SOLN
75.0000 mL | Freq: Once | INTRAVENOUS | Status: AC | PRN
Start: 2024-07-29 — End: 2024-07-29
  Administered 2024-07-29: 75 mL via INTRAVENOUS

## 2024-07-29 NOTE — ED Provider Triage Note (Signed)
 Emergency Medicine Provider Triage Evaluation Note  Diane [name] , a 43 y.o. female  was evaluated in triage.  Pt complains of  sudden onset of headache, described as worst pain I've ever felt two days ago. Unsure if she is having vision changes due to dizziness. Has chronic migraines but states these have been well-controlled. Has been using Tylenol  but states this is not helping. Went to PCP today who advised ED evaluation for severe headache.   Review of Systems  Positive: Dizziness, nausea, neck pain, shortness of breath Negative: Chest pain, weakness, numbness, tingling, head trauma, personal hx brain bleed, aneurysm  Physical Exam  BP 136/80 (BP Location: Right Arm)   Pulse 96   Temp 98.8 F (37.1 C)   Resp 17   LMP 08/26/2018   SpO2 100%  Gen:   Awake, no acute distress  Resp:  Normal effort, CTAB CV:   RRR, S1/S2, no M/R/G MSK:   Sitting in wheelchair, brace on LLE Neuro:  CN II-XII WNL, normal sensation to all extremities Other:  Anxious mood  Medical Decision Making  Medically screening exam initiated at 6:30 PM.  Appropriate orders placed-CT angio head and neck, BMP, CBC, EKG.  Diane [name] was informed that the remainder of the evaluation will be completed by another provider, this initial triage assessment does not replace that evaluation, and the importance of remaining in the ED until their evaluation is complete.    Adele Song, MD 07/29/24 [phone]

## 2024-07-29 NOTE — Telephone Encounter (Signed)
 Patient came in for appt w/ Dr. Kennyth past 10 minute grace period due to needing to pull over because of sx. Due to provider not being able to see patient, the CMA informed me to tell patient to go to the ED. 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 As a result, I scheduled pt a vv w/ Lauraine Pereyra @ 4:40pm.

## 2024-07-29 NOTE — Progress Notes (Signed)
 ==============================  [facility]: [phone]   -- Medical Office Visit --  Patient: Diane [name]      Age: 43 y.o.       Sex:  female  Date:   07/29/2024 Today's Healthcare Provider: Bernardino KANDICE Cone, MD  ==============================   Chief Complaint: Dizziness (/) and Headache Discussed the use of AI scribe software for clinical note transcription with the patient, who gave verbal consent to proceed.  History of Present Illness 43 year old female with a history of migraines who presents with a severe headache and dizziness. She is accompanied by her son, Diane [name].  She has been experiencing a severe headache that began two days ago, described as the 'worst headache of her life.' The headache had a sudden onset, reaching maximum intensity of ten out of ten in under ten seconds. Initially, the headache was accompanied by dizziness, described as the room spinning, which occurred as she was sitting up. The dizziness persists at a six out of ten severity.  The intensity of the headache has decreased from a ten out of ten to a five out of ten but remains present. She has a history of migraines but notes that this headache is unlike any she has experienced before.  She is a parent with responsibilities such as picking up her daughter from school. However she worked with us  to go to emergency room by ambulance due to thunderclap nature of headache(s) and does NOT feel like prior migraine.   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 Past Medical History:  has a past medical history of Acute injury of left knee cartilage (05/24/2024), ADD (attention deficit disorder with hyperactivity), Allergy , Anxiety, Arthritis, Atypical chest pain (07/26/2021), Benign intracranial hypertension (06/16/2008), Choking, Cholelithiasis and acute cholecystitis without obstruction (12/02/2016), Chronic cough (12/27/2013), Depression, Dry mouth (08/12/2017), Dysmenorrhea, Family history of adverse reaction to anesthesia, Gastric ulcer (03/16/2015), GERD (gastroesophageal reflux disease), Headache (07/09/2007), History of gastric ulcer, History of kidney stones, Hyperlipidemia, IBS (irritable bowel syndrome), Lower extremity edema (07/26/2021), Migraines, Mild obstructive sleep apnea, Nausea and vomiting (12/03/2016), OAB (overactive bladder) (08/12/2017), Osteoarthritis, Osteochondral lesion (09/2016), 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 Past Surgical History:   has a past surgical history that includes Cesarean section (12/24/2009); Cesarean section (11/20/2011); laparoscopic appendectomy (N/A, 06/11/2014); Bladder surgery (child); Wisdom tooth extraction; Ovarian cyst surgery (Right, 03/05/2007); Cystoscopy with retrograde pyelogram, ureteroscopy and stent placement (Left, 08/10/2006); Synovectomy (Right, 12/13/2014); Knee arthroscopy with lateral release (Right, 12/13/2014); ORIF ankle  fracture (Right, 01/05/2016); Hardware Removal (Right, 07/01/2016); Ankle arthroscopy (Right, 10/22/2016); Cholecystectomy (N/A, 12/03/2016); Ankle arthroscopy (Right, 09-15-2017   @[facility] ); Tonsillectomy and adenoidectomy (child); Esophagogastroduodenoscopy (egd) with esophageal dilation (07/2018); Robotic assisted laparoscopic hysterectomy and salpingectomy (Bilateral, 08/27/2018); Chondroplasty (Right,  01/12/2024); Harvest bone graft (Right, 01/12/2024); Implantation, matrix-induced autologous chondrocytes (Left, 05/24/2024); and Knee arthroscopy (Left, 05/24/2024). Social History:   reports that she has quit smoking. Her smoking use included cigarettes. She has a 7.5 pack-year smoking history. She has never used smokeless tobacco. She reports current alcohol use. She reports that she does not use drugs. Family History:  family history includes Alcohol abuse in her father and maternal grandfather; Anesthesia problems in her father; Arthritis in her father, maternal grandfather, maternal grandmother, mother, and paternal grandmother; Asthma in her maternal grandmother and mother; Brain cancer in her father; Breast cancer in her maternal grandmother; COPD in her paternal grandmother; Cancer in her father, maternal grandfather, and maternal grandmother; Cancer - Prostate in her maternal grandfather; Depression in her father; Diabetes in her maternal grandmother; Drug abuse in her father; Early death in her father; Heart attack in her maternal grandmother; Heart disease in her maternal grandmother; Hyperlipidemia in her maternal grandfather and maternal grandmother; Hypertension in her father, maternal grandfather, maternal grandmother, and paternal grandmother; Irritable bowel syndrome in her mother; Learning disabilities in her daughter, mother, and son; Lung cancer in her father; Melanoma in her maternal grandfather; Miscarriages / [place] in her mother; Ovarian cancer in her maternal grandmother; Prostate cancer in her  maternal grandfather. Allergies:  is allergic to droperidol , metoclopramide , nickel, prochlorperazine, nsaids, tolmetin, oxybutynin , adhesive [tape], metoclopramide  hcl, prochlorperazine edisylate, and sulfa antibiotics.   Medication Reconciliation: Current Outpatient Medications on File Prior to Visit  Medication Sig   ADDERALL XR 20 MG 24 hr capsule Take 20 mg by mouth every morning.   albuterol  (PROVENTIL  HFA;VENTOLIN  HFA) 108 (90 Base) MCG/ACT inhaler Inhale 2 puffs into the lungs every 6 (six) hours as needed for wheezing or shortness of breath.    ALPRAZolam (XANAX) 0.25 MG tablet SMARTSIG:0.5 Tablet(s) By Mouth PRN   amoxicillin -clavulanate (AUGMENTIN ) 875-125 MG tablet Take 1 tablet by mouth 2 (two) times daily.   aspirin  EC 325 MG tablet Take 1 tablet (325 mg total) by mouth daily.   atorvastatin  (LIPITOR) 40 MG tablet TAKE 1 TABLET BY MOUTH EVERY DAY   Bacillus Coagulans-Inulin (PROBIOTIC) 1-250 BILLION-MG CAPS    cetirizine (ZYRTEC) 10 MG tablet Take by mouth.   diclofenac  Sodium (VOLTAREN ) 1 % GEL APPLY TO AFFECTED AREA AS INSTRUCTED   diclofenac  Sodium (VOLTAREN ) 1 % GEL Apply 4 g topically 4 (four) times daily.   EMGALITY  120 MG/ML SOAJ Inject 120 mg into the skin every 30 (thirty) days.   estradiol  (VIVELLE -DOT) 0.05 MG/24HR patch Place 1 patch (0.05 mg total) onto the skin 2 (two) times a week.   Fezolinetant  (VEOZAH ) 45 MG TABS Take 1 tablet (45 mg total) by mouth daily at 6 (six) AM.   fluconazole  (DIFLUCAN ) 150 MG tablet Take 1 tablet by mouth. If symptoms persist, may take an additional tablet in 3-5 days. May repeat for a total of 3 tablets.   fluticasone  (FLONASE ) 50 MCG/ACT nasal spray Place 2 sprays into both nostrils daily.   gabapentin  (NEURONTIN ) 300 MG capsule TAKE 2 CAPSULES BY MOUTH 3 TIMES DAILY (Patient taking differently: Take 600-1,200 mg by mouth See admin instructions. Take 600mg  by mouth in the morning and 1,200mg  in the evening)   lubiprostone (AMITIZA) 24  MCG capsule TAKE ONE CAPSULE (24 MCG DOSE) BY MOUTH 2 (TWO) TIMES DAILY WITH MEALS.   magnesium  oxide (MAG-OX) 400 MG tablet Take by mouth.   MELATONIN GUMMIES PO Take 1-2 each by mouth at bedtime.   mirtazapine  (REMERON )  15 MG tablet Take 1 tablet (15 mg total) by mouth daily.   Multiple Vitamins-Minerals (EQL CENTUR[facility] WOMENS) TABS    NUCYNTA 100 MG TABS Take 1 tablet by mouth every 8 (eight) hours as needed.   omeprazole (PRILOSEC) 40 MG capsule Take 40 mg by mouth daily.   ondansetron  (ZOFRAN -ODT) 8 MG disintegrating tablet Take 1 tablet (8 mg total) by mouth every 8 (eight) hours as needed.   PARoxetine  (PAXIL ) 10 MG tablet Take 1 tablet (10 mg total) by mouth daily. Replaces Effexor , for hot flushes, switching out will be not feeling good for a few days (Patient taking differently: Take 5 mg by mouth daily. Replaces Effexor , for hot flushes, switching out will be not feeling good for a few days)   progesterone  (PROMETRIUM ) 100 MG capsule Take 3 capsules (300 mg total) by mouth daily.   promethazine  (PHENERGAN ) 25 MG tablet Take 1 tablet (25 mg total) by mouth every 8 (eight) hours as needed for nausea or vomiting.   senna-docusate (SENOKOT-S) 8.6-50 MG tablet Take 1 tablet by mouth daily.   tiZANidine  (ZANAFLEX ) 4 MG tablet Take 4 mg by mouth every 6 (six) hours as needed for muscle spasms.    topiramate  (TOPAMAX ) 25 MG tablet Take 25 mg by mouth daily.   traZODone (DESYREL) 50 MG tablet Take 1-2 tablets by mouth at bedtime as needed.   triamcinolone  cream (KENALOG ) 0.1 % Apply 1 Application topically 2 (two) times daily.   venlafaxine  XR (EFFEXOR -XR) 150 MG 24 hr capsule Take 2 capsules (300 mg total) by mouth daily before breakfast.   zonisamide (ZONEGRAN) 100 MG capsule Take 100 mg by mouth at bedtime.   No current facility-administered medications on file prior to visit.  There are no discontinued medications.   Physical Exam:    07/29/2024    2:24 PM 07/20/2024   12:50 PM 06/17/2024     1:05 PM  Vitals with BMI  Height 5' 7 5' 7 5' 7  Weight 250 lbs 6 oz 250 lbs 6 oz 252 lbs 13 oz  BMI 39.21 39.21 39.58  Systolic 100 120 867  Diastolic 76 82 82  Pulse 118 99 107  Vital signs reviewed.  Nursing notes reviewed. Weight trend reviewed. Physical Activity: Not on file   General Appearance:  No acute distress appreciable.   Well-groomed, healthy-appearing female.  Well proportioned with no abnormal fat distribution.  Good muscle tone. Pulmonary:  Normal work of breathing at rest, no respiratory distress apparent. SpO2: 98 %  Musculoskeletal: All extremities are intact.  Neurological:  Awake, alert, oriented, and engaged.  No obvious focal neurological deficits or cognitive impairments.  Sensorium seems unclouded.   Speech is clear and coherent with logical content. Psychiatric:  Appropriate mood, pleasant and cooperative demeanor, thoughtful and engaged during the exam   Verbalized to patient: Physical Exam NEUROLOGICAL: Cranial nerves grossly intact, moves all extremities without gross motor or sensory deficit, neurological exam normal     06/17/2024    1:13 PM 03/08/2024   10:54 AM 06/17/2023   10:20 AM 08/12/2017    2:45 PM  PHQ 2/9 Scores  PHQ - 2 Score 1 0 2 0  PHQ- 9 Score 3 0 7 0   Office Visit on 03/02/2024  Component Date Value Ref Range Status   D-Dimer, Quant 03/02/2024 1.69 (H)  <0.50 mcg/mL FEU Final  Admission on 02/29/2024, Discharged on 03/01/2024  Component Date Value Ref Range Status   Sodium 02/29/2024 139  135 - 145  mmol/L Final   Potassium 02/29/2024 3.5  3.5 - 5.1 mmol/L Final   Chloride 02/29/2024 103  98 - 111 mmol/L Final   CO2 02/29/2024 26  22 - 32 mmol/L Final   Glucose, Bld 02/29/2024 112 (H)  70 - 99 mg/dL Final   BUN 95/85/7974 9  6 - 20 mg/dL Final   Creatinine, Ser 02/29/2024 0.71  0.44 - 1.00 mg/dL Final   Calcium  02/29/2024 8.8 (L)  8.9 - 10.3 mg/dL Final   GFR, Estimated 02/29/2024 >60  >60 mL/min Final   Anion gap 02/29/2024  10  5 - 15 Final   WBC 02/29/2024 8.9  4.0 - 10.5 K/uL Final   RBC 02/29/2024 4.48  3.87 - 5.11 MIL/uL Final   Hemoglobin 02/29/2024 12.6  12.0 - 15.0 g/dL Final   HCT 95/85/7974 38.6  36.0 - 46.0 % Final   MCV 02/29/2024 86.2  80.0 - 100.0 fL Final   MCH 02/29/2024 28.1  26.0 - 34.0 pg Final   MCHC 02/29/2024 32.6  30.0 - 36.0 g/dL Final   RDW 95/85/7974 13.9  11.5 - 15.5 % Final   Platelets 02/29/2024 313  150 - 400 K/uL Final   nRBC 02/29/2024 0.0  0.0 - 0.2 % Final   Troponin I (High Sensitivity) 02/29/2024 3  <18 ng/L Final   TSH 02/29/2024 1.090  0.350 - 4.500 uIU/mL Final  Office Visit on 02/02/2024  Component Date Value Ref Range Status   Albumin 02/02/2024 4.1  3.9 - 4.9 g/dL Final   Dehydroepiandrosterone 02/02/2024 38  31 - 701 ng/dL Final   LH 96/81/7974 65.0  mIU/mL Final   FSH 02/02/2024 24.3  mIU/mL Final   Prolactin 02/02/2024 16.9  4.8 - 33.4 ng/mL Final   Progesterone  02/02/2024 0.3  ng/mL Final   Estrogen 02/02/2024 276  pg/mL Final   Sex Hormone Binding 02/02/2024 31.8  24.6 - 122.0 nmol/L Final   Vit D, 25-Hydroxy 02/02/2024 44.8  30.0 - 100.0 ng/mL Final  Office Visit on 09/09/2023  Component Date Value Ref Range Status   Rapid Strep A Screen 09/09/2023 Positive (A)  Negative Final   Influenza A, POC 09/09/2023 Negative  Negative Final   Influenza B, POC 09/09/2023 Negative  Negative Final   SARS Coronavirus 2 Ag 09/09/2023 Negative  Negative Final  Office Visit on 07/13/2023  Component Date Value Ref Range Status   Hemoglobin A1C 07/13/2023 5.8 (A)  4.0 - 5.6 % Final  Admission on 06/20/2023, Discharged on 06/20/2023  Component Date Value Ref Range Status   Lipase 06/20/2023 26  11 - 51 U/L Final   Sodium 06/20/2023 138  135 - 145 mmol/L Final   Potassium 06/20/2023 3.7  3.5 - 5.1 mmol/L Final   Chloride 06/20/2023 103  98 - 111 mmol/L Final   CO2 06/20/2023 27  22 - 32 mmol/L Final   Glucose, Bld 06/20/2023 119 (H)  70 - 99 mg/dL Final   BUN 91/96/7975  10  6 - 20 mg/dL Final   Creatinine, Ser 06/20/2023 0.75  0.44 - 1.00 mg/dL Final   Calcium  06/20/2023 9.4  8.9 - 10.3 mg/dL Final   Total Protein 91/96/7975 7.3  6.5 - 8.1 g/dL Final   Albumin 91/96/7975 4.0  3.5 - 5.0 g/dL Final   AST 91/96/7975 28  15 - 41 U/L Final   ALT 06/20/2023 23  0 - 44 U/L Final   Alkaline Phosphatase 06/20/2023 108  38 - 126 U/L Final   Total Bilirubin 06/20/2023  0.3  0.3 - 1.2 mg/dL Final   GFR, Estimated 06/20/2023 >60  >60 mL/min Final   Anion gap 06/20/2023 8  5 - 15 Final   WBC 06/20/2023 4.8  4.0 - 10.5 K/uL Final   RBC 06/20/2023 4.20  3.87 - 5.11 MIL/uL Final   Hemoglobin 06/20/2023 11.6 (L)  12.0 - 15.0 g/dL Final   HCT 91/96/7975 35.3 (L)  36.0 - 46.0 % Final   MCV 06/20/2023 84.0  80.0 - 100.0 fL Final   MCH 06/20/2023 27.6  26.0 - 34.0 pg Final   MCHC 06/20/2023 32.9  30.0 - 36.0 g/dL Final   RDW 91/96/7975 13.8  11.5 - 15.5 % Final   Platelets 06/20/2023 263  150 - 400 K/uL Final   nRBC 06/20/2023 0.0  0.0 - 0.2 % Final   Color, Urine 06/20/2023 YELLOW  YELLOW Final   APPearance 06/20/2023 CLEAR  CLEAR Final   Specific Gravity, Urine 06/20/2023 >1.046 (H)  1.005 - 1.030 Final   pH 06/20/2023 7.5  5.0 - 8.0 Final   Glucose, UA 06/20/2023 NEGATIVE  NEGATIVE mg/dL Final   Hgb urine dipstick 06/20/2023 NEGATIVE  NEGATIVE Final   Bilirubin Urine 06/20/2023 NEGATIVE  NEGATIVE Final   Ketones, ur 06/20/2023 NEGATIVE  NEGATIVE mg/dL Final   Protein, ur 91/96/7975 TRACE (A)  NEGATIVE mg/dL Final   Nitrite 91/96/7975 NEGATIVE  NEGATIVE Final   Leukocytes,Ua 06/20/2023 NEGATIVE  NEGATIVE Final  Office Visit on 06/17/2023  Component Date Value Ref Range Status   Hepatitis C Ab 06/17/2023 NON-REACTIVE  NON-REACTIVE Final   WBC 06/17/2023 6.0  4.0 - 10.5 K/uL Final   RBC 06/17/2023 4.50  3.87 - 5.11 Mil/uL Final   Platelets 06/17/2023 300.0  150.0 - 400.0 K/uL Final   Hemoglobin 06/17/2023 12.3  12.0 - 15.0 g/dL Final   HCT 92/68/7975 37.6  36.0 -  46.0 % Final   MCV 06/17/2023 83.7  78.0 - 100.0 fl Final   MCHC 06/17/2023 32.6  30.0 - 36.0 g/dL Final   RDW 92/68/7975 14.1  11.5 - 15.5 % Final   Sodium 06/17/2023 138  135 - 145 mEq/L Final   Potassium 06/17/2023 4.3  3.5 - 5.1 mEq/L Final   Chloride 06/17/2023 104  96 - 112 mEq/L Final   CO2 06/17/2023 26  19 - 32 mEq/L Final   Glucose, Bld 06/17/2023 89  70 - 99 mg/dL Final   BUN 92/68/7975 13  6 - 23 mg/dL Final   Creatinine, Ser 06/17/2023 0.84  0.40 - 1.20 mg/dL Final   Total Bilirubin 06/17/2023 0.3  0.2 - 1.2 mg/dL Final   Alkaline Phosphatase 06/17/2023 119 (H)  39 - 117 U/L Final   AST 06/17/2023 30  0 - 37 U/L Final   ALT 06/17/2023 27  0 - 35 U/L Final   Total Protein 06/17/2023 7.8  6.0 - 8.3 g/dL Final   Albumin 92/68/7975 4.3  3.5 - 5.2 g/dL Final   GFR 92/68/7975 86.68  >60.00 mL/min Final   Calcium  06/17/2023 9.4  8.4 - 10.5 mg/dL Final   Cholesterol 92/68/7975 157  0 - 200 mg/dL Final   Triglycerides 92/68/7975 184.0 (H)  0.0 - 149.0 mg/dL Final   HDL 92/68/7975 37.20 (L)  >60.99 mg/dL Final   VLDL 92/68/7975 36.8  0.0 - 40.0 mg/dL Final   LDL Cholesterol 06/17/2023 83  0 - 99 mg/dL Final   Total CHOL/HDL Ratio 06/17/2023 4   Final   NonHDL 06/17/2023 120.15   Final  HIV 1&2 Ab, 4th Generation 06/17/2023 NON-REACTIVE  NON-REACTIVE Final   TSH 06/17/2023 1.62  0.35 - 5.50 uIU/mL Final  Admission on 09/01/2022, Discharged on 09/01/2022  Component Date Value Ref Range Status   SARS Coronavirus 2 by RT PCR 09/01/2022 NEGATIVE  NEGATIVE Final   Influenza A by PCR 09/01/2022 POSITIVE (A)  NEGATIVE Final   Influenza B by PCR 09/01/2022 NEGATIVE  NEGATIVE Final   Group A Strep by PCR 09/01/2022 NOT DETECTED  NOT DETECTED Final  No image results found. MR Knee Right Wo Contrast Result Date: 07/26/2024 MR KNEE WITHOUT IV CONTRAST RIGHT COMPARISON: 12/10/2023 CLINICAL HISTORY: Meniscal injury, knee. Acute knee pain. History of prior knee surgery. PULSE SEQUENCES: Ax PD  FS, Sag T2 ACL, Sag PD FS, Cor PD FS & COR T1 FINDINGS: Bones: There is improved edema in the lateral femoral condyle with compared with the prior examination. 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 There is no fracture or contusion pattern. 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 This has progressed when compared with the prior examination. Evaluation is somewhat limited secondary to motion. Stable chondromalacia the medial and lateral compartments. Extensor mechanism is intact. There is a moderate joint effusion. Ligaments: Mild mucoid degeneration is seen in the ACL with intact fibers. The PCL, MCL and fibular collateral ligaments are intact. Menisci: Medial and lateral menisci are unremarkable. No discrete meniscal tear is present. 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 Reactive edema in the posterior lateral femoral condyle. Small reactive joint effusion. Progressive cartilage loss in the central and lateral patella as above. Correlation for anterior knee symptoms. No ligamentous or meniscal injury. Electronically signed by: Norleen Satchel MD 07/26/2024 11:34 AM EDT RP Workstation: [id number]         ASSESSMENT & PLAN   Assessment & Plan Thunderclap headache Thunderclap headache, possible subarachnoid hemorrhage  She experienced a severe headache with sudden onset, reaching maximum intensity in under ten seconds, consistent with a thunderclap headache, two days ago. It was associated with dizziness. Current headache intensity is 5/10. Differential diagnosis includes subarachnoid hemorrhage due to possible ruptured aneurysm. Neurological exam is normal, but the risk of ongoing bleeding and potential for stroke necessitates immediate evaluation. While a tiny aneurysm  might stop bleeding on its own, the risk of stroke from continued bleeding is significant. Advise immediate evaluation in the emergency room for possible subarachnoid hemorrhage. Recommend lumbar puncture to assess for blood in cerebrospinal fluid. Discuss potential intervention if bleeding is confirmed, including endovascular coiling or clipping of the aneurysm. Advise against driving due to risk of seizure or worsening symptoms. Arrange ambulance transport to the hospital.  Dizziness  Dizziness occurs when sitting up, concurrent with the onset of the thunderclap headache. Current dizziness intensity is 6/10. It is likely related to the underlying cause of the headache, possibly subarachnoid hemorrhage. Include in evaluation for thunderclap headache and possible subarachnoid hemorrhage. Tachycardia Led patient in mindfulness exercise and reassured her FH: cerebral aneurysm Grandmother had them    This document was synthesized by artificial intelligence ([employer]) using HIPAA-compliant recording of the clinical interaction;   We discussed the use of AI scribe software for clinical note transcription with the patient, who gave verbal consent to proceed. additional Info: This encounter employed state-of-the-art, real-time, collaborative documentation. The patient actively reviewed and assisted in updating their electronic  medical record on a shared screen, ensuring transparency and facilitating joint problem-solving for the problem list, overview, and plan. This approach promotes accurate, informed care. The treatment plan was discussed and reviewed in detail, including medication safety, potential side effects, and all patient questions. We confirmed understanding and comfort with the plan. Follow-up instructions were established, including contacting the office for any concerns, returning if symptoms worsen, persist, or new symptoms develop, and precautions for potential emergency department visits.

## 2024-07-29 NOTE — ED Triage Notes (Addendum)
 Pt BIB [facility] from home d/t a sudden HA 2 days ago when getting OOB with some double vision, dizziness & mild nausea (Hx of migraines). Also has some neck pain, denies syncope, has had poor appetite & feeling more fatigue. Pt reports she has a family Hx of brain aneurysm & her PCP told her she may need a spinal tap. Denies Tylenol  helping with the HA pain at home, A/Ox4, 122/73, 89 bpm, 97% on RA.

## 2024-07-29 NOTE — Patient Instructions (Signed)
 It was a pleasure seeing you today! Your health and satisfaction are our top priorities.  Diane Cone, MD  VISIT SUMMARY: Today, you came in with a severe headache and dizziness that started two days ago. You described the headache as the worst of your life, with a sudden onset and reaching maximum intensity very quickly. The headache has decreased in intensity but is still present. You also experienced dizziness when sitting up. Given the nature of your symptoms, we need to rule out a serious condition called subarachnoid hemorrhage, which is bleeding in the space around the brain.  YOUR PLAN: -THUNDERCLAP HEADACHE, POSSIBLE SUBARACHNOID HEMORRHAGE: A thunderclap headache is a severe headache that reaches maximum intensity very quickly. This can be a sign of subarachnoid hemorrhage, which is bleeding in the space around the brain. We recommend that you go to the emergency room immediately for further evaluation, including a lumbar puncture to check for blood in the cerebrospinal fluid. If bleeding is confirmed, you may need treatment to stop it, such as endovascular coiling or clipping of the aneurysm. Do not drive due to the risk of seizure or worsening symptoms. An ambulance will take you to the hospital.  -DIZZINESS: Your dizziness started at the same time as your severe headache and is likely related to the same underlying cause, possibly subarachnoid hemorrhage. This will be evaluated along with your headache in the emergency room.  INSTRUCTIONS: Please go to the emergency room immediately for further evaluation. An ambulance has been arranged to take you to the hospital. Do not drive yourself due to the risk of seizure or worsening symptoms.  Your Providers PCP: Diane [name] MATSU, MD,  [phone]) Referring Provider: [name], MD,  [phone]) Care Team Provider: Gorge Ade, MD,  [phone]) Care Team Provider: Domenica Reusing, MD,  [phone])  NEXT STEPS: [x]   Early Intervention: Schedule sooner appointment, call our on-call services, or go to emergency room if there is any significant Increase in pain or discomfort New or worsening symptoms Sudden or severe changes in your health [x]  Flexible Follow-Up: We recommend a No follow-ups on file. for optimal routine care. This allows for progress monitoring and treatment adjustments. [x]  Preventive Care: Schedule your annual preventive care visit! It's typically covered by insurance and helps identify potential health issues early. [x]  Lab & X-ray Appointments: Incomplete tests scheduled today, or call to schedule. X-rays: [facility] (M-F, 8:30am-noon or 1pm-5pm). [x]  Medical Information Release: Sign a release form at front desk to obtain relevant medical information we don't have.  MAKING THE MOST OF OUR FOCUSED 20 MINUTE APPOINTMENTS: [x]   Clearly state your top concerns at the beginning of the visit to focus our discussion [x]   If you anticipate you will need more time, please inform the front desk during scheduling - we can book multiple appointments in the same week. [x]   If you have transportation problems- use our convenient video appointments or ask about transportation support. [x]   We can get down to business faster if you use MyChart to update information before the visit and submit non-urgent questions before your visit. Thank you for taking the time to provide details through MyChart.  Let our nurse know and she can import this information into your encounter documents.  Arrival and Wait Times: [x]   Arriving on time ensures that everyone receives prompt attention. [x]   Early morning (8a) and afternoon (1p) appointments tend to have shortest wait times. [x]   Unfortunately, we cannot delay appointments for late arrivals or  hold slots during phone calls.  Getting Answers and Following Up [x]   Simple Questions & Concerns: For quick questions or basic follow-up after your visit,  reach us  at [phone] or MyChart messaging. [x]   Complex Concerns: If your concern is more complex, scheduling an appointment might be best. Discuss this with the staff to find the most suitable option. [x]   Lab & Imaging Results: We'll contact you directly if results are abnormal or you don't use MyChart. Most normal results will be on MyChart within 2-3 business days, with a review message from Dr. Jesus. Haven't heard back in 2 weeks? Need results sooner? Contact us  at [phone]. [x]   Referrals: Our referral coordinator will manage specialist referrals. The specialist's office should contact you within 2 weeks to schedule an appointment. Call us  if you haven't heard from them after 2 weeks.  Staying Connected [x]   MyChart: Activate your MyChart for the fastest way to access results and message us . See the last page of this paperwork for instructions on how to activate.  Bring to Your Next Appointment [x]   Medications: Please bring all your medication bottles to your next appointment to ensure we have an accurate record of your prescriptions. [x]   Health Diaries: If you're monitoring any health conditions at home, keeping a diary of your readings can be very helpful for discussions at your next appointment.  Billing [x]   X-ray & Lab Orders: These are billed by separate companies. Contact the invoicing company directly for questions or concerns. [x]   Visit Charges: Discuss any billing inquiries with our administrative services team.  Your Satisfaction Matters [x]   Share Your Experience: We strive for your satisfaction! If you have any complaints, or preferably compliments, please let Dr. Jesus know directly or contact our Practice Administrators, Manuelita Rubin or [employer], by asking at the front desk.   Reviewing Your Records [x]   Review this early draft of your clinical encounter notes below and the final encounter summary tomorrow on MyChart after its been completed.  All  orders placed so far are visible here: Thunderclap headache  Tachycardia

## 2024-07-30 MED ORDER — MAGNESIUM SULFATE 2 GM/50ML IV SOLN
2.0000 g | Freq: Once | INTRAVENOUS | Status: AC
  Administered 2024-07-30: 2 g via INTRAVENOUS
  Filled 2024-07-30: qty 50

## 2024-07-30 MED ORDER — LACTATED RINGERS IV BOLUS
1000.0000 mL | Freq: Once | INTRAVENOUS | Status: AC
  Administered 2024-07-30: 1000 mL via INTRAVENOUS

## 2024-07-30 MED ORDER — MECLIZINE HCL 25 MG PO TABS: 25.0000 mg | ORAL_TABLET | Freq: Three times a day (TID) | ORAL | 0 refills | Status: AC | PRN

## 2024-07-30 MED ORDER — DIPHENHYDRAMINE HCL 50 MG/ML IJ SOLN
25.0000 mg | Freq: Once | INTRAMUSCULAR | Status: AC
  Administered 2024-07-30: 25 mg via INTRAVENOUS
  Filled 2024-07-30: qty 1

## 2024-07-30 MED ORDER — MECLIZINE HCL 25 MG PO TABS
25.0000 mg | ORAL_TABLET | Freq: Once | ORAL | Status: AC
  Administered 2024-07-30: 25 mg via ORAL
  Filled 2024-07-30: qty 1

## 2024-07-30 MED ORDER — DEXAMETHASONE SODIUM PHOSPHATE 10 MG/ML IJ SOLN
10.0000 mg | Freq: Once | INTRAMUSCULAR | Status: AC
Start: 2024-07-30 — End: 2024-07-30
  Administered 2024-07-30: 10 mg via INTRAVENOUS
  Filled 2024-07-30: qty 1

## 2024-07-30 NOTE — Discharge Instructions (Addendum)
 You had a normal CTA of your head and neck.  I recommend that you follow-up with a headache specialist.  I have provided you with a referral to a neurology practice.  You can take the meclizine  for dizziness.  Please return for new or worsening symptoms.

## 2024-07-30 NOTE — ED Provider Notes (Signed)
 [facility] Emergency Department Provider Note MRN:  [id number]  Arrival date & time: 07/30/24     Chief Complaint   Headache, Neck Pain, Dizziness, and Nausea   History of Present Illness   Diane [name] is a 43 y.o. year-old female presents to the ED with chief complaint of headache.  Onset was 2 days ago.  She states that it started when she woke up.  States that she was sitting up from bed and began having room is spinning dizziness.  States that shortly thereafter she had a very sharp headache, which then resolved and then developed into a severe pulsating headache for the past couple of days.  States that she has been sleeping more than normal.  She denies any vision or speech changes.  She denies numbness, weakness, or tingling.  She states that she still has the room spinning dizziness.  Denies fever.  History provided by patient.   Review of Systems  Pertinent positive and negative review of systems noted in HPI.    Physical Exam   Vitals:   07/30/24 0050 07/30/24 0340  BP: 122/77 138/86  Pulse: 80 88  Resp: 18 16  Temp: 98.4 F (36.9 C)   SpO2: 100% 100%    CONSTITUTIONAL:  non toxic-appearing, NAD NEURO:  Alert and oriented x 3, CN 3-12 grossly intact, normal finger to nose, normal heel to shin, no pronator drift, ambulatory (but dizzy), normal strength and sensation in upper and lower extremities EYES:  eyes equal and reactive ENT/NECK:  Supple, no stridor  CARDIO:  normal rate, appears well-perfused  PULM:  No respiratory distress, CTAB GI/GU:  non-distended,  MSK/SPINE:  No gross deformities, no edema, moves all extremities  SKIN:  no rash, atraumatic   *Additional and/or pertinent findings included in MDM below  Diagnostic and Interventional Summary    EKG Interpretation Date/Time:    Ventricular Rate:    PR Interval:    QRS Duration:    QT Interval:    QTC Calculation:   R Axis:      Text Interpretation:          Labs Reviewed  BASIC METABOLIC PANEL WITH GFR - Abnormal; Notable for the following components:      Result Value   CO2 21 (*)    Glucose, Bld 106 (*)    All other components within normal limits  CBC WITH DIFFERENTIAL/PLATELET    CT Angio Head Neck W WO CM  Final Result      Medications  iohexol  (OMNIPAQUE ) 350 MG/ML injection 75 mL (75 mLs Intravenous Contrast Given 07/29/24 1924)  lactated ringers  bolus 1,000 mL (0 mLs Intravenous Stopped 07/30/24 0341)  meclizine  (ANTIVERT ) tablet 25 mg (25 mg Oral Given 07/30/24 0215)  diphenhydrAMINE  (BENADRYL ) injection 25 mg (25 mg Intravenous Given 07/30/24 0217)  dexamethasone  (DECADRON ) injection 10 mg (10 mg Intravenous Given 07/30/24 0216)  magnesium  sulfate IVPB 2 g 50 mL (0 g Intravenous Stopped 07/30/24 0341)     Procedures  /  Critical Care Procedures  ED Course and Medical Decision Making  I have reviewed the triage vital signs, the nursing notes, and pertinent available records from the EMR.  Social Determinants Affecting Complexity of Care: Patient has no clinically significant social determinants affecting this chief complaint..   ED Course:    Medical Decision Making Patient here with headache and dizziness.  She has had the symptoms for the past 2 days.  Was initially a severe sharp headache, which improved  and then came back as a dull throbbing headache.  She states that she experiences significant dizziness with head movement and eye movement.  This is improved when she closes her eyes and holds her head still.  She denies any numbness, weakness, or tingling.  She states that sometimes the dizziness/spinning is so bad it looks like there are double, but does not report actual double vision.  She had reassuring workup in triage with a normal CTA head and neck.  We discussed and thoughtfully considered lumbar puncture, but ultimately patient declined at this study.  After discussing risks and benefits, patient stated that she  would prefer to be treated for her symptoms and try for outpatient follow-up.  I have a very low suspicion of subarachnoid hemorrhage.  After treatment with Decadron , Benadryl , magnesium , and fluids, patient states that she feels a little better.  She has able to ambulate, but states that she still feels dizzy.  On my reassessment, she states that she would prefer to go home.  Given reassuring workup, do think that this is appropriate, but have given her return precautions.  Will have her follow-up with neurology.  Will discharge home on meclizine .  Amount and/or Complexity of Data Reviewed Labs: ordered. Radiology: ordered.  Risk Prescription drug management.         Consultants: No consultations were needed in caring for this patient.   Treatment and Plan: I considered admission due to patient's initial presentation, but after considering the examination and diagnostic results, patient will not require admission and can be discharged with outpatient follow-up.  Patient discussed with attending physician, Dr. Bari, who agrees with plan.  States MRI not indicated with the sensitivity/specificity of CTA, LP is going to be very low yield..  I reiterated this to the patient and she is very reassured with the CTA and know that there weren't any aneurysms or obvious bleeding.   Final Clinical Impressions(s) / ED Diagnoses     ICD-10-CM   1. Dizziness  R42 Ambulatory referral to Neurology    2. Increased severity of headaches  R51.9       ED Discharge Orders          Ordered    Ambulatory referral to Neurology       Comments: An appointment is requested in approximately: 2 weeks   07/30/24 0418    meclizine  (ANTIVERT ) 25 MG tablet  3 times daily PRN        07/30/24 0419              Discharge Instructions Discussed with and Provided to Patient:     Discharge Instructions      You had a normal CTA of your head and neck.  I recommend that you follow-up with a  headache specialist.  I have provided you with a referral to a neurology practice.  You can take the meclizine  for dizziness.  Please return for new or worsening symptoms.       [name], PA-C 07/30/24 [id number]    Bari Charmaine FALCON, MD 07/30/24 [phone]

## 2024-07-30 NOTE — ED Notes (Signed)
 This nurse ambulated patient in hallway. Patient ambulated approximately 30 feet. Patient had an unsteady gait and complained of dizziness. Patient require minimal assistance with ambulation.

## 2024-08-02 ENCOUNTER — Encounter: Payer: Self-pay | Admitting: Internal Medicine

## 2024-08-02 ENCOUNTER — Other Ambulatory Visit: Payer: Self-pay | Admitting: Internal Medicine

## 2024-08-02 DIAGNOSIS — L2082 Flexural eczema: Secondary | ICD-10-CM

## 2024-08-02 NOTE — Progress Notes (Incomplete)
 43 y.o. G82P1102 female here for ***. Married.  Patient's last menstrual period was 08/26/2018.    She reports ***. Urine sample provided: ***  Birth control: *** Last mammogram: 05/09/13 density C, bi-rads 1 neg Sexually active: ***    GYN HISTORY: ***  OB History  Gravida Para Term Preterm AB Living  2 2 1 1  0 2  SAB IAB Ectopic Multiple Live Births  0 0 0 0 2    # Outcome Date GA Lbr Len/2nd Weight Sex Type Anes PTL Lv  2 Preterm 11/20/11 [redacted]w[redacted]d  5 lb 10.7 oz (2.57 kg) F CS-LTranv Spinal  LIV  1 Term            Past Medical History:  Diagnosis Date   Acute injury of left knee cartilage 05/24/2024   ADD (attention deficit disorder with hyperactivity)    Allergy     Anxiety    Arthritis    Atypical chest pain 07/26/2021   Atypical chest pain     Benign intracranial hypertension 06/16/2008   Qualifier: Diagnosis of   By: Zimonjic, Milica         Choking    due to food and pills get stuck   Cholelithiasis and acute cholecystitis without obstruction 12/02/2016   Chronic cough 12/27/2013   Depression    Dry mouth 08/12/2017   Dysmenorrhea    Family history of adverse reaction to anesthesia    pt's father has hx. of being hard to wake up post-op   Gastric ulcer 03/16/2015   GERD (gastroesophageal reflux disease)    Headache 07/09/2007   Qualifier: Diagnosis of   By: Tammie MD, Bruce      Replacing diagnoses that were inactivated after the 02/16/23 regulatory import     History of gastric ulcer    History of kidney stones    Hyperlipidemia    IBS (irritable bowel syndrome)    Lower extremity edema 07/26/2021   Lower extremity edema     Migraines    neurologist-  dr c. hagen ([facility] in [place])-- treated with Emgality  injection every 30 days   Mild obstructive sleep apnea    per study in epic 03-21-2017 mild osa, recommendation cpap, mouth appliance, wt loss   Nausea and vomiting 12/03/2016   OAB (overactive bladder) 08/12/2017    Osteoarthritis    right ankle   Osteochondral lesion 09/2016   right ankle   Osteomyelitis of knee region [facility])    Peripheral vascular disease (HCC)    NERVE DAMAGE RIGHT LEG AND FOOT DUE TO INJURGY.    Plantar fasciitis of left foot    Pleural effusion 12/15/2013   PONV (postoperative nausea and vomiting)    AND HEADACHES   Pseudotumor cerebri    at back head   RUQ pain    S/P dilatation of esophageal stricture 07/2018   Sinusitis 08/12/2018   RESOLVED WITH ANTIOBIOTIC   SUI (stress urinary incontinence, female)    Wears contact lenses    Wears partial dentures    upper   Past Surgical History:  Procedure Laterality Date   ANKLE ARTHROSCOPY Right 10/22/2016   Procedure: ANKLE ARTHROSCOPY;  Surgeon: Donnice JONELLE Fees, DPM;  Location: [facility];  Service: Podiatry;  Laterality: Right;  GENERAL/REG BLOCK   ANKLE ARTHROSCOPY Right 09-15-2017   @[facility]    BLADDER SURGERY  child   x 2 - as a child to stretch bladder   CESAREAN SECTION  12/24/2009   CESAREAN SECTION  11/20/2011  Procedure: CESAREAN SECTION;  Surgeon: Charlie JINNY Flowers, MD;  Location: [facility];  Service: Gynecology;  Laterality: N/A;   CHOLECYSTECTOMY N/A 12/03/2016   Procedure: LAPAROSCOPIC CHOLECYSTECTOMY WITH INTRAOPERATIVE CHOLANGIOGRAM;  Surgeon: Krystal Spinner, MD;  Location: [facility];  Service: General;  Laterality: N/A;   CHONDROPLASTY Right 01/12/2024   Procedure: RIGHT KNEE ARTHROSCOPY LATERAL FEMORAL CHONDROPLASTY;  Surgeon: Genelle Standing, MD;  Location: [facility];  Service: Orthopedics;  Laterality: Right;   CYSTOSCOPY WITH RETROGRADE PYELOGRAM, URETEROSCOPY AND STENT PLACEMENT Left 08/10/2006   ESOPHAGOGASTRODUODENOSCOPY (EGD) WITH ESOPHAGEAL DILATION  07/2018   HARDWARE REMOVAL Right 07/01/2016   Procedure: HARDWARE REMOVAL;  Surgeon: Glendia Cordella Hutchinson, MD;  Location: [facility];  Service: Orthopedics;  Laterality: Right;   HARVEST BONE GRAFT Right 01/12/2024   Procedure: RIGHT BONE MARROW  ASPIRATE FROM ILIAC CREST DECOMPRESSION AND PLACEMENT RIGHT LATERAL FEMORAL CONDYLE;  Surgeon: Genelle Standing, MD;  Location: [facility];  Service: Orthopedics;  Laterality: Right;   IMPLANTATION, MATRIX-INDUCED AUTOLOGOUS CHONDROCYTES Left 05/24/2024   Procedure: IMPLANTATION, MATRIX-INDUCED AUTOLOGOUS CHONDROCYTES;  Surgeon: Genelle Standing, MD;  Location: [facility];  Service: Orthopedics;  Laterality: Left;  LEFT KNEE MATRIX ASSOCIATED CHONDROCYTE IMPLANTATION   KNEE ARTHROSCOPY Left 05/24/2024   Procedure: ARTHROSCOPY, KNEE;  Surgeon: Genelle Standing, MD;  Location: [facility];  Service: Orthopedics;  Laterality: Left;   KNEE ARTHROSCOPY WITH LATERAL RELEASE Right 12/13/2014   Procedure: KNEE ARTHROSCOPY WITH LATERAL RELEASE;  Surgeon: LELON JONETTA Shari Mickey., MD;  Location: [facility];  Service: Orthopedics;  Laterality: Right;   LAPAROSCOPIC APPENDECTOMY N/A 06/11/2014   Procedure: APPENDECTOMY LAPAROSCOPIC;  Surgeon: Krystal CHRISTELLA Spinner, MD;  Location: [facility];  Service: General;  Laterality: N/A;   ORIF ANKLE FRACTURE Right 01/05/2016   Procedure: OPEN REDUCTION INTERNAL FIXATION (ORIF) ANKLE FRACTURE;  Surgeon: Glendia Cordella Hutchinson, MD;  [facility];  Service: Orthopedics;  Laterality: Right;   OVARIAN CYST SURGERY Right 03/05/2007   AND CHROMOPERTUBATION  VIA LAPAROSCOPY   ROBOTIC ASSISTED LAPAROSCOPIC HYSTERECTOMY AND SALPINGECTOMY Bilateral 08/27/2018   Procedure: XI ROBOTIC ASSISTED LAPAROSCOPIC HYSTERECTOMY AND BILATERAL SALPINGECTOMY;  Surgeon: Flowers Charlie, MD;  Location: [facility];  Service: Gynecology;  Laterality: Bilateral;  [facility] for 23hr OBS   SYNOVECTOMY Right 12/13/2014   Procedure: PLICA SYNOVECTOMY;  Surgeon: LELON JONETTA Shari Mickey., MD;  Location: [facility];  Service: Orthopedics;  Laterality: Right;   TONSILLECTOMY AND ADENOIDECTOMY  child   WISDOM TOOTH EXTRACTION     Current Outpatient Medications on File Prior to Visit   Medication Sig Dispense Refill   ADDERALL XR 20 MG 24 hr capsule Take 20 mg by mouth every morning.  0   albuterol  (PROVENTIL  HFA;VENTOLIN  HFA) 108 (90 Base) MCG/ACT inhaler Inhale 2 puffs into the lungs every 6 (six) hours as needed for wheezing or shortness of breath.      ALPRAZolam (XANAX) 0.25 MG tablet SMARTSIG:0.5 Tablet(s) By Mouth PRN     amoxicillin -clavulanate (AUGMENTIN ) 875-125 MG tablet Take 1 tablet by mouth 2 (two) times daily. 14 tablet 0   aspirin  EC 325 MG tablet Take 1 tablet (325 mg total) by mouth daily. 14 tablet 0   atorvastatin  (LIPITOR) 40 MG tablet TAKE 1 TABLET BY MOUTH EVERY DAY 90 tablet 1   Bacillus Coagulans-Inulin (PROBIOTIC) 1-250 BILLION-MG CAPS      cetirizine (ZYRTEC) 10 MG tablet Take by mouth.     diclofenac  Sodium (VOLTAREN ) 1 % GEL APPLY TO AFFECTED AREA AS  INSTRUCTED     diclofenac  Sodium (VOLTAREN ) 1 % GEL Apply 4 g topically 4 (four) times daily. 50 g 3   EMGALITY  120 MG/ML SOAJ Inject 120 mg into the skin every 30 (thirty) days. 3 mL 4   estradiol  (VIVELLE -DOT) 0.05 MG/24HR patch Place 1 patch (0.05 mg total) onto the skin 2 (two) times a week. 8 patch 12   Fezolinetant  (VEOZAH ) 45 MG TABS Take 1 tablet (45 mg total) by mouth daily at 6 (six) AM. 30 tablet 11   fluconazole  (DIFLUCAN ) 150 MG tablet Take 1 tablet by mouth. If symptoms persist, may take an additional tablet in 3-5 days. May repeat for a total of 3 tablets. 3 tablet 0   fluticasone  (FLONASE ) 50 MCG/ACT nasal spray Place 2 sprays into both nostrils daily. 16 g 6   gabapentin  (NEURONTIN ) 300 MG capsule TAKE 2 CAPSULES BY MOUTH 3 TIMES DAILY (Patient taking differently: Take 600-1,200 mg by mouth See admin instructions. Take 600mg  by mouth in the morning and 1,200mg  in the evening) 180 capsule 4   lubiprostone (AMITIZA) 24 MCG capsule TAKE ONE CAPSULE (24 MCG DOSE) BY MOUTH 2 (TWO) TIMES DAILY WITH MEALS. 180 capsule 1   magnesium  oxide (MAG-OX) 400 MG tablet Take by mouth.     meclizine   (ANTIVERT ) 25 MG tablet Take 1 tablet (25 mg total) by mouth 3 (three) times daily as needed for dizziness. 30 tablet 0   MELATONIN GUMMIES PO Take 1-2 each by mouth at bedtime.     mirtazapine  (REMERON ) 15 MG tablet Take 1 tablet (15 mg total) by mouth daily. 90 tablet 3   Multiple Vitamins-Minerals (EQL CENTURY WOMENS) TABS      NUCYNTA 100 MG TABS Take 1 tablet by mouth every 8 (eight) hours as needed.     omeprazole (PRILOSEC) 40 MG capsule Take 40 mg by mouth daily.     ondansetron  (ZOFRAN -ODT) 8 MG disintegrating tablet Take 1 tablet (8 mg total) by mouth every 8 (eight) hours as needed. 60 tablet 11   PARoxetine  (PAXIL ) 10 MG tablet Take 1 tablet (10 mg total) by mouth daily. Replaces Effexor , for hot flushes, switching out will be not feeling good for a few days (Patient taking differently: Take 5 mg by mouth daily. Replaces Effexor , for hot flushes, switching out will be not feeling good for a few days) 90 tablet 4   progesterone  (PROMETRIUM ) 100 MG capsule Take 3 capsules (300 mg total) by mouth daily. 270 capsule 4   promethazine  (PHENERGAN ) 25 MG tablet Take 1 tablet (25 mg total) by mouth every 8 (eight) hours as needed for nausea or vomiting. 10 tablet 0   senna-docusate (SENOKOT-S) 8.6-50 MG tablet Take 1 tablet by mouth daily. 10 tablet 0   tiZANidine  (ZANAFLEX ) 4 MG tablet Take 4 mg by mouth every 6 (six) hours as needed for muscle spasms.      topiramate  (TOPAMAX ) 25 MG tablet Take 25 mg by mouth daily.     traZODone (DESYREL) 50 MG tablet Take 1-2 tablets by mouth at bedtime as needed.     triamcinolone  cream (KENALOG ) 0.1 % Apply 1 Application topically 2 (two) times daily. 30 g 0   venlafaxine  XR (EFFEXOR -XR) 150 MG 24 hr capsule Take 2 capsules (300 mg total) by mouth daily before breakfast. 60 capsule 11   zonisamide (ZONEGRAN) 100 MG capsule Take 100 mg by mouth at bedtime.     No current facility-administered medications on file prior to visit.   Allergies  Allergen  Reactions   Droperidol  Other (See Comments)    Jittery and restlessness   Metoclopramide  Other (See Comments)    Reglan    Nickel Rash    any jewelry that's not real/ rash and itching    Prochlorperazine Other (See Comments)   Nsaids Other (See Comments)    DUE TO HISTORY OF GASTRIC ULCER   Tolmetin Itching and Other (See Comments)    DUE TO HISTORY OF GASTRIC ULCER  DUE TO HISTORY OF GASTRIC ULCER  DUE TO HISTORY OF GASTRIC ULCER, DUE TO HISTORY OF GASTRIC ULCER   Oxybutynin  Other (See Comments)    severe dry mouth   Adhesive [Tape] Rash   Metoclopramide  Hcl Other (See Comments)    RESTLESSNESS/JITTERY   Prochlorperazine Edisylate Other (See Comments)    RESTLESSNESS/JITTERY    Sulfa Antibiotics Rash      PE There were no vitals filed for this visit. There is no height or weight on file to calculate BMI.  Physical Exam    Assessment and Plan:        There are no diagnoses linked to this encounter.  Clotilda FORBES Pa, CMA

## 2024-08-02 NOTE — Telephone Encounter (Signed)
 Patient presented with thunderclap headache and severe dizziness. ER workup included CT, CTA head/neck, bloodwork--all reassuring: no hemorrhage, stroke, aneurysm, or vascular abnormality. No focal neurologic deficits. LP was discussed but deferred by patient due to prior traumatic experience and low suspicion for [facility] per ER attending. Discharged with outpatient neurology referral, meclizine , and return precautions.  Still symptomatic (headache, dizziness). Frustrated with ER experience, long wait, and lack of MRI. Requests MRI and/or expedited neurology consult. Strong preference to avoid spinal tap unless absolutely necessary. MRI is most helpful for non-vascular causes (posterior fossa, demyelination, Chiari, etc.) after CT/CTA are negative. Persistent dizziness and headache may be migrainous, vestibular, or related to mild cerebellar tonsillar ectopia. Patient's preference for neurologist and avoidance of repeat LP is reasonable given history and current low suspicion for [facility].

## 2024-08-03 ENCOUNTER — Ambulatory Visit: Admitting: Obstetrics and Gynecology

## 2024-08-04 ENCOUNTER — Encounter: Payer: Self-pay | Admitting: Internal Medicine

## 2024-08-04 ENCOUNTER — Ambulatory Visit (INDEPENDENT_AMBULATORY_CARE_PROVIDER_SITE_OTHER): Admitting: Orthopaedic Surgery

## 2024-08-04 ENCOUNTER — Encounter (HOSPITAL_BASED_OUTPATIENT_CLINIC_OR_DEPARTMENT_OTHER): Payer: Self-pay | Admitting: Orthopaedic Surgery

## 2024-08-04 DIAGNOSIS — M949 Disorder of cartilage, unspecified: Secondary | ICD-10-CM

## 2024-08-04 NOTE — Progress Notes (Signed)
 Post Operative Evaluation    Procedure/Date of Surgery: Status post left knee matrix associated chondrocyte implantation 7/8  Interval History:    Presents today for right knee follow-up and MRI discussion  PMH/PSH/Family History/Social History/Meds/Allergies:    Past Medical History:  Diagnosis Date   Acute injury of left knee cartilage 05/24/2024   ADD (attention deficit disorder with hyperactivity)    Allergy     Anxiety    Arthritis    Atypical chest pain 07/26/2021   Atypical chest pain     Benign intracranial hypertension 06/16/2008   Qualifier: Diagnosis of   By: Zimonjic, Milica         Choking    due to food and pills get stuck   Cholelithiasis and acute cholecystitis without obstruction 12/02/2016   Chronic cough 12/27/2013   Depression    Dry mouth 08/12/2017   Dysmenorrhea    Family history of adverse reaction to anesthesia    pt's father has hx. of being hard to wake up post-op   Gastric ulcer 03/16/2015   GERD (gastroesophageal reflux disease)    Headache 07/09/2007   Qualifier: Diagnosis of   By: Tammie MD, Bruce      Replacing diagnoses that were inactivated after the 02/16/23 regulatory import     History of gastric ulcer    History of kidney stones    Hyperlipidemia    IBS (irritable bowel syndrome)    Lower extremity edema 07/26/2021   Lower extremity edema     Migraines    neurologist-  dr c. hagen ([facility] in [place])-- treated with Emgality  injection every 30 days   Mild obstructive sleep apnea    per study in epic 03-21-2017 mild osa, recommendation cpap, mouth appliance, wt loss   Nausea and vomiting 12/03/2016   OAB (overactive bladder) 08/12/2017   Osteoarthritis    right ankle   Osteochondral lesion 09/2016   right ankle   Osteomyelitis of knee region [facility])    Peripheral vascular disease (HCC)    NERVE DAMAGE RIGHT LEG AND FOOT DUE TO INJURGY.    Plantar fasciitis of left foot     Pleural effusion 12/15/2013   PONV (postoperative nausea and vomiting)    AND HEADACHES   Pseudotumor cerebri    at back head   RUQ pain    S/P dilatation of esophageal stricture 07/2018   Sinusitis 08/12/2018   RESOLVED WITH ANTIOBIOTIC   SUI (stress urinary incontinence, female)    Wears contact lenses    Wears partial dentures    upper   Past Surgical History:  Procedure Laterality Date   ANKLE ARTHROSCOPY Right 10/22/2016   Procedure: ANKLE ARTHROSCOPY;  Surgeon: Donnice JONELLE Fees, DPM;  Location: [facility];  Service: Podiatry;  Laterality: Right;  GENERAL/REG BLOCK   ANKLE ARTHROSCOPY Right 09-15-2017   @[facility]    BLADDER SURGERY  child   x 2 - as a child to stretch bladder   CESAREAN SECTION  12/24/2009   CESAREAN SECTION  11/20/2011   Procedure: CESAREAN SECTION;  Surgeon: Charlie JINNY Flowers, MD;  Location: [facility];  Service: Gynecology;  Laterality: N/A;   CHOLECYSTECTOMY N/A 12/03/2016   Procedure: LAPAROSCOPIC CHOLECYSTECTOMY WITH INTRAOPERATIVE CHOLANGIOGRAM;  Surgeon: Krystal Spinner, MD;  Location: [facility];  Service: General;  Laterality: N/A;   CHONDROPLASTY  Right 01/12/2024   Procedure: RIGHT KNEE ARTHROSCOPY LATERAL FEMORAL CHONDROPLASTY;  Surgeon: Genelle Standing, MD;  Location: [facility];  Service: Orthopedics;  Laterality: Right;   CYSTOSCOPY WITH RETROGRADE PYELOGRAM, URETEROSCOPY AND STENT PLACEMENT Left 08/10/2006   ESOPHAGOGASTRODUODENOSCOPY (EGD) WITH ESOPHAGEAL DILATION  07/2018   HARDWARE REMOVAL Right 07/01/2016   Procedure: HARDWARE REMOVAL;  Surgeon: Glendia Cordella Hutchinson, MD;  Location: [facility];  Service: Orthopedics;  Laterality: Right;   HARVEST BONE GRAFT Right 01/12/2024   Procedure: RIGHT BONE MARROW ASPIRATE FROM ILIAC CREST DECOMPRESSION AND PLACEMENT RIGHT LATERAL FEMORAL CONDYLE;  Surgeon: Genelle Standing, MD;  Location: [facility];  Service: Orthopedics;  Laterality: Right;   IMPLANTATION, MATRIX-INDUCED AUTOLOGOUS  CHONDROCYTES Left 05/24/2024   Procedure: IMPLANTATION, MATRIX-INDUCED AUTOLOGOUS CHONDROCYTES;  Surgeon: Genelle Standing, MD;  Location: [facility];  Service: Orthopedics;  Laterality: Left;  LEFT KNEE MATRIX ASSOCIATED CHONDROCYTE IMPLANTATION   KNEE ARTHROSCOPY Left 05/24/2024   Procedure: ARTHROSCOPY, KNEE;  Surgeon: Genelle Standing, MD;  Location: [facility];  Service: Orthopedics;  Laterality: Left;   KNEE ARTHROSCOPY WITH LATERAL RELEASE Right 12/13/2014   Procedure: KNEE ARTHROSCOPY WITH LATERAL RELEASE;  Surgeon: LELON JONETTA Shari Mickey., MD;  Location: [facility];  Service: Orthopedics;  Laterality: Right;   LAPAROSCOPIC APPENDECTOMY N/A 06/11/2014   Procedure: APPENDECTOMY LAPAROSCOPIC;  Surgeon: Krystal CHRISTELLA Spinner, MD;  Location: [facility];  Service: General;  Laterality: N/A;   ORIF ANKLE FRACTURE Right 01/05/2016   Procedure: OPEN REDUCTION INTERNAL FIXATION (ORIF) ANKLE FRACTURE;  Surgeon: Glendia Cordella Hutchinson, MD;  Location[facility];  Service: Orthopedics;  Laterality: Right;   OVARIAN CYST SURGERY Right 03/05/2007   AND CHROMOPERTUBATION  VIA LAPAROSCOPY   ROBOTIC ASSISTED LAPAROSCOPIC HYSTERECTOMY AND SALPINGECTOMY Bilateral 08/27/2018   Procedure: XI ROBOTIC ASSISTED LAPAROSCOPIC HYSTERECTOMY AND BILATERAL SALPINGECTOMY;  Surgeon: Gorge Ade, MD;  Location: [facility];  Service: Gynecology;  Laterality: Bilateral;  [facility] for 23hr OBS   SYNOVECTOMY Right 12/13/2014   Procedure: PLICA SYNOVECTOMY;  Surgeon: LELON JONETTA Shari Mickey., MD;  Location:  [facility];  Service: Orthopedics;  Laterality: Right;   TONSILLECTOMY AND ADENOIDECTOMY  child   WISDOM TOOTH EXTRACTION     Social History   Socioeconomic History   Marital status: Married    Spouse name: Not on file   Number of children: 2   Years of education: Not on file   Highest education level: Not on file  Occupational History   Occupation: Investment banker, corporate: UNEMPLOYED    Comment: works from  home  Tobacco Use   Smoking status: Former    Current packs/day: 0.50    Average packs/day: 0.5 packs/day for 15.0 years (7.5 ttl pk-yrs)    Types: Cigarettes   Smokeless tobacco: Never  Vaping Use   Vaping status: Every Day   Substances: Nicotine   Substance and Sexual Activity   Alcohol use: Yes    Comment: occasional   Drug use: No   Sexual activity: Yes    Birth control/protection: Surgical    Comment: partial hysterectomy  Other Topics Concern   Not on file  Social History Narrative   Not on file   Social Drivers of Health   Financial Resource Strain: Not on file  Food Insecurity: Not on file  Transportation Needs: Not on file  Physical Activity: Not on file  Stress: Not on file  Social Connections: Unknown (03/26/2022)   Received from [facility]   Social Network  Social Network: Not on file   Family History  Problem Relation Age of Onset   Learning disabilities Mother    Asthma Mother    Arthritis Mother    Irritable bowel syndrome Mother    Miscarriages / [place] Mother    Hypertension Father    Early death Father    Drug abuse Father    Depression Father    Cancer Father    Arthritis Father    Alcohol abuse Father    Brain cancer Father    Anesthesia problems Father        hard to wake up post-op   Lung cancer Father    Hyperlipidemia Maternal Grandmother    Hypertension Maternal Grandmother    Cancer Maternal Grandmother    Asthma Maternal Grandmother    Arthritis Maternal Grandmother    Breast cancer Maternal Grandmother    Ovarian cancer Maternal Grandmother    Diabetes Maternal Grandmother    Heart disease Maternal Grandmother    Heart attack Maternal Grandmother    Hypertension Maternal Grandfather    Hyperlipidemia Maternal Grandfather    Arthritis Maternal Grandfather    Alcohol abuse Maternal Grandfather    Cancer Maternal Grandfather    Prostate cancer Maternal Grandfather    Melanoma Maternal Grandfather    Cancer -  Prostate Maternal Grandfather    Hypertension Paternal Grandmother    COPD Paternal Grandmother    Arthritis Paternal Grandmother    Learning disabilities Daughter    Learning disabilities Son    Colon cancer Neg Hx    Allergies  Allergen Reactions   Droperidol  Other (See Comments)    Jittery and restlessness   Metoclopramide  Other (See Comments)    Reglan    Nickel Rash    any jewelry that's not real/ rash and itching    Prochlorperazine Other (See Comments)   Nsaids Other (See Comments)    DUE TO HISTORY OF GASTRIC ULCER   Tolmetin Itching and Other (See Comments)    DUE TO HISTORY OF GASTRIC ULCER  DUE TO HISTORY OF GASTRIC ULCER  DUE TO HISTORY OF GASTRIC ULCER, DUE TO HISTORY OF GASTRIC ULCER   Oxybutynin  Other (See Comments)    severe dry mouth   Adhesive [Tape] Rash   Metoclopramide  Hcl Other (See Comments)    RESTLESSNESS/JITTERY   Prochlorperazine Edisylate Other (See Comments)    RESTLESSNESS/JITTERY    Sulfa Antibiotics Rash   Current Outpatient Medications  Medication Sig Dispense Refill   ADDERALL XR 20 MG 24 hr capsule Take 20 mg by mouth every morning.  0   albuterol  (PROVENTIL  HFA;VENTOLIN  HFA) 108 (90 Base) MCG/ACT inhaler Inhale 2 puffs into the lungs every 6 (six) hours as needed for wheezing or shortness of breath.      ALPRAZolam (XANAX) 0.25 MG tablet SMARTSIG:0.5 Tablet(s) By Mouth PRN     amoxicillin -clavulanate (AUGMENTIN ) 875-125 MG tablet Take 1 tablet by mouth 2 (two) times daily. 14 tablet 0   aspirin  EC 325 MG tablet Take 1 tablet (325 mg total) by mouth daily. 14 tablet 0   atorvastatin  (LIPITOR) 40 MG tablet TAKE 1 TABLET BY MOUTH EVERY DAY 90 tablet 1   Bacillus Coagulans-Inulin (PROBIOTIC) 1-250 BILLION-MG CAPS      cetirizine (ZYRTEC) 10 MG tablet Take by mouth.     diclofenac  Sodium (VOLTAREN ) 1 % GEL APPLY TO AFFECTED AREA AS INSTRUCTED     diclofenac  Sodium (VOLTAREN ) 1 % GEL Apply 4 g topically 4 (four) times daily. 50 g 3  EMGALITY  120 MG/ML SOAJ Inject 120 mg into the skin every 30 (thirty) days. 3 mL 4   estradiol  (VIVELLE -DOT) 0.05 MG/24HR patch Place 1 patch (0.05 mg total) onto the skin 2 (two) times a week. 8 patch 12   Fezolinetant  (VEOZAH ) 45 MG TABS Take 1 tablet (45 mg total) by mouth daily at 6 (six) AM. 30 tablet 11   fluconazole  (DIFLUCAN ) 150 MG tablet Take 1 tablet by mouth. If symptoms persist, may take an additional tablet in 3-5 days. May repeat for a total of 3 tablets. 3 tablet 0   fluticasone  (FLONASE ) 50 MCG/ACT nasal spray Place 2 sprays into both nostrils daily. 16 g 6   gabapentin  (NEURONTIN ) 300 MG capsule TAKE 2 CAPSULES BY MOUTH 3 TIMES DAILY (Patient taking differently: Take 600-1,200 mg by mouth See admin instructions. Take 600mg  by mouth in the morning and 1,200mg  in the evening) 180 capsule 4   lubiprostone (AMITIZA) 24 MCG capsule TAKE ONE CAPSULE (24 MCG DOSE) BY MOUTH 2 (TWO) TIMES DAILY WITH MEALS. 180 capsule 1   magnesium  oxide (MAG-OX) 400 MG tablet Take by mouth.     meclizine  (ANTIVERT ) 25 MG tablet Take 1 tablet (25 mg total) by mouth 3 (three) times daily as needed for dizziness. 30 tablet 0   MELATONIN GUMMIES PO Take 1-2 each by mouth at bedtime.     mirtazapine  (REMERON ) 15 MG tablet Take 1 tablet (15 mg total) by mouth daily. 90 tablet 3   Multiple Vitamins-Minerals (EQL CENTURY WOMENS) TABS      NUCYNTA 100 MG TABS Take 1 tablet by mouth every 8 (eight) hours as needed.     omeprazole (PRILOSEC) 40 MG capsule Take 40 mg by mouth daily.     ondansetron  (ZOFRAN -ODT) 8 MG disintegrating tablet Take 1 tablet (8 mg total) by mouth every 8 (eight) hours as needed. 60 tablet 11   PARoxetine  (PAXIL ) 10 MG tablet Take 1 tablet (10 mg total) by mouth daily. Replaces Effexor , for hot flushes, switching out will be not feeling good for a few days (Patient taking differently: Take 5 mg by mouth daily. Replaces Effexor , for hot flushes, switching out will be not feeling good for a few  days) 90 tablet 4   progesterone  (PROMETRIUM ) 100 MG capsule Take 3 capsules (300 mg total) by mouth daily. 270 capsule 4   promethazine  (PHENERGAN ) 25 MG tablet Take 1 tablet (25 mg total) by mouth every 8 (eight) hours as needed for nausea or vomiting. 10 tablet 0   senna-docusate (SENOKOT-S) 8.6-50 MG tablet Take 1 tablet by mouth daily. 10 tablet 0   tiZANidine  (ZANAFLEX ) 4 MG tablet Take 4 mg by mouth every 6 (six) hours as needed for muscle spasms.      topiramate  (TOPAMAX ) 25 MG tablet Take 25 mg by mouth daily.     traZODone (DESYREL) 50 MG tablet Take 1-2 tablets by mouth at bedtime as needed.     triamcinolone  cream (KENALOG ) 0.1 % APPLY TO AFFECTED AREA TWICE A DAY 30 g 0   venlafaxine  XR (EFFEXOR -XR) 150 MG 24 hr capsule TAKE 2 CAPS BY MOUTH DAILY 60 capsule 1   zonisamide (ZONEGRAN) 100 MG capsule Take 100 mg by mouth at bedtime.     No current facility-administered medications for this visit.   No results found.  Review of Systems:   A ROS was performed including pertinent positives and negatives as documented in the HPI.   Musculoskeletal Exam:    Last menstrual period  08/26/2018.  Left knee with well-appearing incision.  There is no erythema or drainage.  Range of motion is from 0 to 45 degrees.  There is some quad atrophy with an effusion about the knee.  Distal neurosensory exam is intact  Right knee with tenderness about the patellofemoral joint as well as lateral joint line.  There is some crepitus appreciated to range of motion from 0 to 130 degrees.  She does still have an effusion about the knee  Imaging:    MRI right knee: Full-thickness chondral loss measuring approximately 3 x 3 cm lateral patellar facet as well as central ridge as well as central trochlear lesion measuring 3 x 1 cm  I personally reviewed and interpreted the radiographs.   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  Unfortunately she does appear to have a lot of symptoms emanating from this.  I did discuss that we do have a cartilage biopsy available for growth as needed.  At this time she will continue physical therapy.  I do believe she may ultimately be a candidate for MACI placement in this but at this time she has been having issues with headaches that she would plan to get under control first.  Will plan for possible Macy placement this year Plan :    - Plan for possible right knee arthrotomy with Maci placement this year      I personally saw and evaluated the patient, and participated in the management and treatment plan.  Elspeth Parker, MD Attending Physician, Orthopedic Surgery  This document was dictated using Dragon voice recognition software. A reasonable attempt at proof reading has been made to minimize errors.

## 2024-08-08 ENCOUNTER — Other Ambulatory Visit (HOSPITAL_BASED_OUTPATIENT_CLINIC_OR_DEPARTMENT_OTHER): Payer: Self-pay | Admitting: Orthopaedic Surgery

## 2024-08-08 ENCOUNTER — Ambulatory Visit: Admitting: Neurology

## 2024-08-08 ENCOUNTER — Encounter (HOSPITAL_BASED_OUTPATIENT_CLINIC_OR_DEPARTMENT_OTHER): Payer: Self-pay | Admitting: Orthopaedic Surgery

## 2024-08-08 DIAGNOSIS — M8700 Idiopathic aseptic necrosis of unspecified bone: Secondary | ICD-10-CM

## 2024-08-09 ENCOUNTER — Telehealth: Payer: Self-pay

## 2024-08-09 DIAGNOSIS — N951 Menopausal and female climacteric states: Secondary | ICD-10-CM

## 2024-08-09 MED ORDER — VEOZAH 45 MG PO TABS: 1.0000 | ORAL_TABLET | Freq: Every day | ORAL | 11 refills | Status: DC

## 2024-08-09 NOTE — Telephone Encounter (Signed)
 Copied from CRM #[id number]. Topic: General - Other >> Aug 09, 2024  3:30 PM Rosina BIRCH wrote: Reason for CRM: patient called stating she need her Fezolinetant  (VEOZAH ) prescription faxed to [facility] (a discount plan). Patient stated she is out of medication  Fax-[phone]  CB for [phone]

## 2024-08-09 NOTE — Addendum Note (Signed)
 Addended by: Hilliary Jock G on: 08/09/2024 09:22 PM   Modules accepted: Orders

## 2024-08-10 ENCOUNTER — Other Ambulatory Visit: Payer: Self-pay

## 2024-08-10 DIAGNOSIS — N951 Menopausal and female climacteric states: Secondary | ICD-10-CM

## 2024-08-10 MED ORDER — VEOZAH 45 MG PO TABS: 1.0000 | ORAL_TABLET | Freq: Every day | ORAL | 11 refills | Status: DC

## 2024-08-10 NOTE — Telephone Encounter (Signed)
 I tried to reorder not able to print for me if you could review this medication for pt and print for me that would be great and I can fax over for pt to the right pharmacy

## 2024-08-11 ENCOUNTER — Ambulatory Visit (INDEPENDENT_AMBULATORY_CARE_PROVIDER_SITE_OTHER): Admitting: Neurology

## 2024-08-11 ENCOUNTER — Encounter: Payer: Self-pay | Admitting: Neurology

## 2024-08-11 ENCOUNTER — Encounter: Payer: Self-pay | Admitting: Internal Medicine

## 2024-08-11 VITALS — BP 92/54 | HR 114 | Ht 66.0 in | Wt 250.5 lb

## 2024-08-11 DIAGNOSIS — G43009 Migraine without aura, not intractable, without status migrainosus: Secondary | ICD-10-CM

## 2024-08-11 DIAGNOSIS — G44209 Tension-type headache, unspecified, not intractable: Secondary | ICD-10-CM

## 2024-08-11 DIAGNOSIS — G935 Compression of brain: Secondary | ICD-10-CM | POA: Diagnosis not present

## 2024-08-11 MED ORDER — METHYLPREDNISOLONE 4 MG PO TBPK: ORAL_TABLET | ORAL | 0 refills | Status: DC

## 2024-08-11 MED ORDER — ZOLMITRIPTAN 5 MG PO TABS: 5.0000 mg | ORAL_TABLET | ORAL | 3 refills | Status: DC | PRN

## 2024-08-11 NOTE — Progress Notes (Unsigned)
 [facility]  PATIENT: Diane [name] DOB: 1981/03/10  REQUESTING CLINICIAN: [name], PA-C HISTORY FROM: Patient  REASON FOR VISIT: Headaches    HISTORICAL  CHIEF COMPLAINT:  Chief Complaint  Patient presents with   New Patient (Initial Visit)    Pt in room 12. Mom in room. Urgent internal referral for dizziness and thunderclap HA.    HISTORY OF PRESENT ILLNESS:  Discussed the use of AI scribe software for clinical note transcription with the patient, who gave verbal consent to proceed.   Diane [name] is a 43 year old female with migraines who presents with persistent dizziness and headache.  She experiences severe dizziness described as 'the whole room spinning' and a headache that escalated rapidly to a 10 out of 10 in intensity. The dizziness and headache have been continuous since onset, differing from her typical migraines. The headache is primarily located on the right side of her head and occasionally at the back of her head.  She has a long-standing history of migraines since age [age], usually well-controlled with Emgality  and other preventative medications. Previously, she experienced 15 to 18 migraines a month, but now she has significantly fewer, possibly one or two, and they are shorter in duration. Her current medications for migraine prevention include zonisamide at night and Emgality . She also takes topiramate , which was prescribed to help her stop drinking Dr. Nunzio by making it taste unpleasant, as part of her weight loss efforts. During we work up, she had CTA head and neck which showed a possible chiari malformation.   She is on multiple medications that affect the serotonin system, including Remeron , Paxil , and venlafaxine  and Tramadol . She started taking a half dose of Paxil  months ago to help with hot flashes. She occasionally takes trazodone for sleep. She is concerned about the number of medications she is on and their potential  side effects, including weight gain from Remeron  as she is trying to lose weight  She has a history of knee problems and surgeries, including the [facility] procedure, which may have contributed to an increase in migraine frequency. She is also dealing with anxiety and depression, which she attributes to past traumatic events involving her son.  No new medications correlate with the onset of symptoms, and she is sleeping well.     OTHER MEDICAL CONDITIONS: Anxiety/depression, migraine headaches, obesity, insomnia,   REVIEW OF SYSTEMS: Full 14 system review of systems performed and negative with exception of: As noted in the HPI  ALLERGIES: Allergies  Allergen Reactions   Droperidol  Other (See Comments)    Jittery and restlessness   Metoclopramide  Other (See Comments)    Reglan    Nickel Rash    any jewelry that's not real/ rash and itching    Prochlorperazine Other (See Comments)   Nsaids Other (See Comments)    DUE TO HISTORY OF GASTRIC ULCER   Tolmetin Itching and Other (See Comments)    DUE TO HISTORY OF GASTRIC ULCER  DUE TO HISTORY OF GASTRIC ULCER  DUE TO HISTORY OF GASTRIC ULCER, DUE TO HISTORY OF GASTRIC ULCER   Oxybutynin  Other (See Comments)    severe dry mouth   Adhesive [Tape] Rash   Metoclopramide  Hcl Other (See Comments)    RESTLESSNESS/JITTERY   Prochlorperazine Edisylate Other (See Comments)    RESTLESSNESS/JITTERY    Sulfa Antibiotics Rash    HOME MEDICATIONS: Outpatient Medications Prior to Visit  Medication Sig Dispense Refill   ADDERALL XR 20 MG 24 hr capsule Take  20 mg by mouth every morning.  0   albuterol  (PROVENTIL  HFA;VENTOLIN  HFA) 108 (90 Base) MCG/ACT inhaler Inhale 2 puffs into the lungs every 6 (six) hours as needed for wheezing or shortness of breath.      ALPRAZolam (XANAX) 0.25 MG tablet SMARTSIG:0.5 Tablet(s) By Mouth PRN     atorvastatin  (LIPITOR) 40 MG tablet TAKE 1 TABLET BY MOUTH EVERY DAY 90 tablet 1   Bacillus Coagulans-Inulin  (PROBIOTIC) 1-250 BILLION-MG CAPS      cetirizine (ZYRTEC) 10 MG tablet Take by mouth.     diclofenac  Sodium (VOLTAREN ) 1 % GEL Apply 4 g topically 4 (four) times daily. 50 g 3   EMGALITY  120 MG/ML SOAJ Inject 120 mg into the skin every 30 (thirty) days. 3 mL 4   estradiol  (VIVELLE -DOT) 0.05 MG/24HR patch Place 1 patch (0.05 mg total) onto the skin 2 (two) times a week. 8 patch 12   Fezolinetant  (VEOZAH ) 45 MG TABS Take 1 tablet (45 mg total) by mouth daily at 6 (six) AM. 30 tablet 11   fluticasone  (FLONASE ) 50 MCG/ACT nasal spray Place 2 sprays into both nostrils daily. 16 g 6   gabapentin  (NEURONTIN ) 300 MG capsule TAKE 2 CAPSULES BY MOUTH 3 TIMES DAILY (Patient taking differently: Take 600-1,200 mg by mouth See admin instructions. Take 600mg  by mouth in the morning and 1,200mg  in the evening) 180 capsule 4   lubiprostone (AMITIZA) 24 MCG capsule TAKE ONE CAPSULE (24 MCG DOSE) BY MOUTH 2 (TWO) TIMES DAILY WITH MEALS. 180 capsule 1   magnesium  oxide (MAG-OX) 400 MG tablet Take by mouth.     meclizine  (ANTIVERT ) 25 MG tablet Take 1 tablet (25 mg total) by mouth 3 (three) times daily as needed for dizziness. 30 tablet 0   MELATONIN GUMMIES PO Take 1-2 each by mouth at bedtime.     mirtazapine  (REMERON ) 15 MG tablet Take 1 tablet (15 mg total) by mouth daily. 90 tablet 3   Multiple Vitamins-Minerals (EQL CENTURY WOMENS) TABS      NUCYNTA 100 MG TABS Take 1 tablet by mouth every 8 (eight) hours as needed.     omeprazole (PRILOSEC) 40 MG capsule Take 40 mg by mouth daily.     ondansetron  (ZOFRAN -ODT) 8 MG disintegrating tablet Take 1 tablet (8 mg total) by mouth every 8 (eight) hours as needed. 60 tablet 11   PARoxetine  (PAXIL ) 10 MG tablet Take 1 tablet (10 mg total) by mouth daily. Replaces Effexor , for hot flushes, switching out will be not feeling good for a few days (Patient taking differently: Take 5 mg by mouth daily. Replaces Effexor , for hot flushes, switching out will be not feeling good for a  few days) 90 tablet 4   progesterone  (PROMETRIUM ) 100 MG capsule Take 3 capsules (300 mg total) by mouth daily. 270 capsule 4   promethazine  (PHENERGAN ) 25 MG tablet Take 1 tablet (25 mg total) by mouth every 8 (eight) hours as needed for nausea or vomiting. 10 tablet 0   senna-docusate (SENOKOT-S) 8.6-50 MG tablet Take 1 tablet by mouth daily. 10 tablet 0   tiZANidine  (ZANAFLEX ) 4 MG tablet Take 4 mg by mouth every 6 (six) hours as needed for muscle spasms.      topiramate  (TOPAMAX ) 25 MG tablet Take 25 mg by mouth daily.     traZODone (DESYREL) 50 MG tablet Take 1-2 tablets by mouth at bedtime as needed.     triamcinolone  cream (KENALOG ) 0.1 % APPLY TO AFFECTED AREA TWICE A DAY  30 g 0   venlafaxine  XR (EFFEXOR -XR) 150 MG 24 hr capsule TAKE 2 CAPS BY MOUTH DAILY 60 capsule 1   zonisamide (ZONEGRAN) 100 MG capsule Take 100 mg by mouth at bedtime.     amoxicillin -clavulanate (AUGMENTIN ) 875-125 MG tablet Take 1 tablet by mouth 2 (two) times daily. (Patient not taking: Reported on 08/11/2024) 14 tablet 0   aspirin  EC 325 MG tablet Take 1 tablet (325 mg total) by mouth daily. (Patient not taking: Reported on 08/11/2024) 14 tablet 0   fluconazole  (DIFLUCAN ) 150 MG tablet Take 1 tablet by mouth. If symptoms persist, may take an additional tablet in 3-5 days. May repeat for a total of 3 tablets. (Patient not taking: Reported on 08/11/2024) 3 tablet 0   diclofenac  Sodium (VOLTAREN ) 1 % GEL APPLY TO AFFECTED AREA AS INSTRUCTED (Patient not taking: Reported on 08/11/2024)     No facility-administered medications prior to visit.    PAST MEDICAL HISTORY: Past Medical History:  Diagnosis Date   Acute injury of left knee cartilage 05/24/2024   ADD (attention deficit disorder with hyperactivity)    Allergy     Anxiety    Arthritis    Atypical chest pain 07/26/2021   Atypical chest pain     Benign intracranial hypertension 06/16/2008   Qualifier: Diagnosis of   By: Zimonjic, Milica         Choking    due  to food and pills get stuck   Cholelithiasis and acute cholecystitis without obstruction 12/02/2016   Chronic cough 12/27/2013   Depression    Dry mouth 08/12/2017   Dysmenorrhea    Family history of adverse reaction to anesthesia    pt's father has hx. of being hard to wake up post-op   Gastric ulcer 03/16/2015   GERD (gastroesophageal reflux disease)    Headache 07/09/2007   Qualifier: Diagnosis of   By: Tammie MD, Bruce      Replacing diagnoses that were inactivated after the 02/16/23 regulatory import     History of gastric ulcer    History of kidney stones    Hyperlipidemia    IBS (irritable bowel syndrome)    Lower extremity edema 07/26/2021   Lower extremity edema     Migraines    neurologist-  dr c. hagen ([facility] in [place])-- treated with Emgality  injection every 30 days   Mild obstructive sleep apnea    per study in epic 03-21-2017 mild osa, recommendation cpap, mouth appliance, wt loss   Nausea and vomiting 12/03/2016   OAB (overactive bladder) 08/12/2017   Osteoarthritis    right ankle   Osteochondral lesion 09/2016   right ankle   Osteomyelitis of knee region [facility])    Peripheral vascular disease    NERVE DAMAGE RIGHT LEG AND FOOT DUE TO INJURGY.    Plantar fasciitis of left foot    Pleural effusion 12/15/2013   PONV (postoperative nausea and vomiting)    AND HEADACHES   Pseudotumor cerebri    at back head   RUQ pain    S/P dilatation of esophageal stricture 07/2018   Sinusitis 08/12/2018   RESOLVED WITH ANTIOBIOTIC   SUI (stress urinary incontinence, female)    Wears contact lenses    Wears partial dentures    upper    PAST SURGICAL HISTORY: Past Surgical History:  Procedure Laterality Date   ANKLE ARTHROSCOPY Right 10/22/2016   Procedure: ANKLE ARTHROSCOPY;  Surgeon: Donnice JONELLE Fees, DPM;  Location: [facility];  Service: Podiatry;  Laterality: Right;  GENERAL/REG BLOCK   ANKLE ARTHROSCOPY Right 09-15-2017   @[facility]     BLADDER SURGERY  child   x 2 - as a child to stretch bladder   CESAREAN SECTION  12/24/2009   CESAREAN SECTION  11/20/2011   Procedure: CESAREAN SECTION;  Surgeon: Charlie JINNY Flowers, MD;  Location: [facility];  Service: Gynecology;  Laterality: N/A;   CHOLECYSTECTOMY N/A 12/03/2016   Procedure: LAPAROSCOPIC CHOLECYSTECTOMY WITH INTRAOPERATIVE CHOLANGIOGRAM;  Surgeon: Krystal Spinner, MD;  Location: [facility];  Service: General;  Laterality: N/A;   CHONDROPLASTY Right 01/12/2024   Procedure: RIGHT KNEE ARTHROSCOPY LATERAL FEMORAL CHONDROPLASTY;  Surgeon: Genelle Standing, MD;  Location: [facility];  Service: Orthopedics;  Laterality: Right;   CYSTOSCOPY WITH RETROGRADE PYELOGRAM, URETEROSCOPY AND STENT PLACEMENT Left 08/10/2006   ESOPHAGOGASTRODUODENOSCOPY (EGD) WITH ESOPHAGEAL DILATION  07/2018   HARDWARE REMOVAL Right 07/01/2016   Procedure: HARDWARE REMOVAL;  Surgeon: Glendia Cordella Hutchinson, MD;  Location: [facility];  Service: Orthopedics;  Laterality: Right;   HARVEST BONE GRAFT Right 01/12/2024   Procedure: RIGHT BONE MARROW ASPIRATE FROM ILIAC CREST DECOMPRESSION AND PLACEMENT RIGHT LATERAL FEMORAL CONDYLE;  Surgeon: Genelle Standing, MD;  Location: [facility];  Service: Orthopedics;  Laterality: Right;   IMPLANTATION, MATRIX-INDUCED AUTOLOGOUS CHONDROCYTES Left 05/24/2024   Procedure: IMPLANTATION, MATRIX-INDUCED AUTOLOGOUS CHONDROCYTES;  Surgeon: Genelle Standing, MD;  Location: [facility];  Service: Orthopedics;  Laterality: Left;  LEFT KNEE MATRIX ASSOCIATED CHONDROCYTE IMPLANTATION   KNEE ARTHROSCOPY Left 05/24/2024   Procedure: ARTHROSCOPY, KNEE;  Surgeon: Genelle Standing, MD;  Location: [facility];  Service: Orthopedics;  Laterality: Left;   KNEE ARTHROSCOPY WITH LATERAL RELEASE Right 12/13/2014   Procedure: KNEE ARTHROSCOPY WITH LATERAL RELEASE;  Surgeon: LELON JONETTA Shari Mickey., MD;  Location: [facility];  Service: Orthopedics;  Laterality: Right;    LAPAROSCOPIC APPENDECTOMY N/A 06/11/2014   Procedure: APPENDECTOMY LAPAROSCOPIC;  Surgeon: Krystal CHRISTELLA Spinner, MD;  Location: [facility];  Service: General;  Laterality: N/A;   ORIF ANKLE FRACTURE Right 01/05/2016   Procedure: OPEN REDUCTION INTERNAL FIXATION (ORIF) ANKLE FRACTURE;  Surgeon: Glendia Cordella Hutchinson, MD;  Location: [facility];  Service: Orthopedics;  Laterality: Right;   OVARIAN CYST SURGERY Right 03/05/2007   AND CHROMOPERTUBATION  VIA LAPAROSCOPY   ROBOTIC ASSISTED LAPAROSCOPIC HYSTERECTOMY AND SALPINGECTOMY Bilateral 08/27/2018   Procedure: XI ROBOTIC ASSISTED LAPAROSCOPIC HYSTERECTOMY AND BILATERAL SALPINGECTOMY;  Surgeon: Flowers Charlie, MD;  Location: [facility];  Service: Gynecology;  Laterality: Bilateral;  [facility] for 23hr OBS   SYNOVECTOMY Right 12/13/2014   Procedure: PLICA SYNOVECTOMY;  Surgeon: LELON JONETTA Shari Mickey., MD;  Location:  [facility];  Service: Orthopedics;  Laterality: Right;   TONSILLECTOMY AND ADENOIDECTOMY  child   WISDOM TOOTH EXTRACTION      FAMILY HISTORY: Family History  Problem Relation Age of Onset   Learning disabilities Mother    Asthma Mother    Arthritis Mother    Irritable bowel syndrome Mother    Miscarriages / [place] Mother    Hypertension Father    Early death Father    Drug abuse Father    Depression Father    Cancer Father    Arthritis Father    Alcohol abuse Father    Brain cancer Father    Anesthesia problems Father        hard to wake up post-op   Lung cancer Father    Hyperlipidemia Maternal Grandmother    Hypertension Maternal Grandmother    Cancer  Maternal Grandmother    Asthma Maternal Grandmother    Arthritis Maternal Grandmother    Breast cancer Maternal Grandmother    Ovarian cancer Maternal Grandmother    Diabetes Maternal Grandmother    Heart disease Maternal Grandmother    Heart attack Maternal Grandmother    Hypertension Maternal Grandfather    Hyperlipidemia Maternal Grandfather    Arthritis Maternal Grandfather     Alcohol abuse Maternal Grandfather    Cancer Maternal Grandfather    Prostate cancer Maternal Grandfather    Melanoma Maternal Grandfather    Cancer - Prostate Maternal Grandfather    Hypertension Paternal Grandmother    COPD Paternal Grandmother    Arthritis Paternal Grandmother    Learning disabilities Daughter    Learning disabilities Son    Colon cancer Neg Hx     SOCIAL HISTORY: Social History   Socioeconomic History   Marital status: Married    Spouse name: Not on file   Number of children: 2   Years of education: Not on file   Highest education level: Not on file  Occupational History   Occupation: Investment banker, 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  Tobacco Use   Smoking status: Former    Current packs/day: 0.50    Average packs/day: 0.5 packs/day for 15.0 years (7.5 ttl pk-yrs)    Types: Cigarettes   Smokeless tobacco: Never  Vaping Use   Vaping status: Every Day   Substances: Nicotine   Substance and Sexual Activity   Alcohol use: Yes    Comment: occasional   Drug use: No   Sexual activity: Yes    Birth control/protection: Surgical    Comment: partial hysterectomy  Other Topics Concern   Not on file  Social History Narrative   Not on file   Social Drivers of Health   Financial Resource Strain: Not on file  Food Insecurity: Not on file  Transportation Needs: Not on file  Physical Activity: Not on file  Stress: Not on file  Social Connections: Unknown (03/26/2022)   [facility]   Social Network    Social Network: Not on file  Intimate Partner Violence: Unknown (02/19/2022)   Received from [facility]   HITS    Physically Hurt: Not on file    Insult or Talk Down To: Not on file    Threaten Physical Harm: Not on file    Scream or Curse: Not on file    PHYSICAL EXAM  GENERAL EXAM/CONSTITUTIONAL: Vitals:  Vitals:   08/11/24 1540  BP: (!) 92/54  Pulse: (!) 114  Weight: 250 lb 8 oz (113.6 kg)  Height: 5' 6 (1.676 m)    Body mass index is 40.43 kg/m. Wt Readings from Last 3 Encounters:  08/11/24 250 lb 8 oz (113.6 kg)  07/29/24 250 lb 6.4 oz (113.6 kg)  07/20/24 250 lb 6.1 oz (113.6 kg)   Patient is in no distress; well developed, nourished and groomed; neck is supple  MUSCULOSKELETAL: Gait, strength, tone, movements noted in Neurologic exam below  NEUROLOGIC: MENTAL STATUS:      No data to display         awake, alert, oriented to person, place and time recent and remote memory intact normal attention and concentration language fluent, comprehension intact, naming intact fund of knowledge appropriate  CRANIAL NERVE:  2nd, 3rd, 4th, 6th - Visual fields full to confrontation, extraocular muscles intact, no nystagmus 5th - facial sensation symmetric 7th - facial strength symmetric  8th - hearing intact 9th - palate elevates symmetrically, uvula midline 11th - shoulder shrug symmetric 12th - tongue protrusion midline  MOTOR:  normal bulk and tone, full strength in the BUE, BLE  SENSORY:  normal and symmetric to light touch  COORDINATION:  finger-nose-finger, fine finger movements normal  GAIT/STATION:  Antalgic gait     DIAGNOSTIC DATA (LABS, IMAGING, TESTING) - I reviewed patient records, labs, notes, testing and imaging myself where available.  Lab Results  Component Value Date   WBC 8.3 07/29/2024   HGB 12.1 07/29/2024   HCT 38.7 07/29/2024   MCV 86.2 07/29/2024   PLT 362 07/29/2024      Component Value Date/Time   NA 136 07/29/2024 1626   NA 139 07/26/2021 1053   K 3.8 07/29/2024 1626   CL 101 07/29/2024 1626   CO2 21 (L) 07/29/2024 1626   GLUCOSE 106 (H) 07/29/2024 1626   BUN 7 07/29/2024 1626   BUN 9 07/26/2021 1053   CREATININE 0.86 07/29/2024 1626   CALCIUM  9.1 07/29/2024 1626   PROT 7.3 06/20/2023 1542   PROT 6.8 07/26/2021 1053   ALBUMIN 4.1 02/02/2024 1146   AST 28 06/20/2023 1542   ALT 23 06/20/2023 1542   ALKPHOS 108 06/20/2023 1542   BILITOT  0.3 06/20/2023 1542   BILITOT 0.2 07/26/2021 1053   GFRNONAA >60 07/29/2024 1626   GFRAA >60 07/13/2020 2143   Lab Results  Component Value Date   CHOL 157 06/17/2023   HDL 37.20 (L) 06/17/2023   LDLCALC 83 06/17/2023   TRIG 184.0 (H) 06/17/2023   CHOLHDL 4 06/17/2023   Lab Results  Component Value Date   HGBA1C 5.8 (A) 07/13/2023   Lab Results  Component Value Date   VITAMINB12 535 04/30/2012   Lab Results  Component Value Date   TSH 1.090 02/29/2024    CT Head, CTA Head and Neck 07/29/2024 1. Normal CTA of the head and neck. No large vessel occlusion, hemodynamically significant stenosis, or other acute vascular abnormality. No aneurysm. 2. No other acute intracranial abnormality.    ASSESSMENT AND PLAN  43 y.o. 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 Symptoms include persistent headache primarily on the right side and dizziness. Differential diagnosis includes migraine, but symptoms are distinct from her typical migraines. - Order MRI of the brain to further evaluate Chiari I malformation - Prescribe Medrol  Dosepak for the next 6 days to alleviate headache - Advise on hydration and use of electrolyte packs to address dizziness - Prescribe Zomig  (zomitriptan) as abortive therapy for headache  Migraine, well controlled on preventive therapy Migraines since age [age], currently well controlled with Emgality , zonisamide, and topiramate . Previously experienced 15-18 migraines per month, now reduced to 1-2 per month with shorter duration. Insurance issues with obtaining CGRP medications like Nurtec. - Continue Emgality , zonisamide, for migraine prevention - Prescribe Zomig  (zomitriptan) for acute migraine attacks  Polypharmacy and medication regimen review Currently on multiple medications  including antidepressants (Remeron , Paxil , venlafaxine ), headache preventatives (zonisamide, topiramate ), and occasional trazodone. Concerns about potential serotonin syndrome due to multiple serotonergic medications. Remeron  noted to cause weight gain, counterproductive to weight loss efforts. - Advise consultation with PCP and possibly a clinical pharmacist for medication review and optimization - Discuss with PCP about potential reduction or discontinuation of Remeron  due to weight gain  Depression and anxiety  Depression and anxiety exacerbated by personal and family stressors. Managed with multiple antidepressants. - Discuss with PCP about potential referral to a therapist for additional support  Low blood pressure possible due to dehydration Low blood pressure (94/54 mmHg) with elevated heart rate (114 bpm) likely due to dehydration. - Advise increased fluid intake to address dehydration - Monitor blood pressure and heart rate     1. Chiari I malformation (HCC)   2. Tension headache   3. Migraine without aura and without status migrainosus, not intractable     Patient Instructions  Will obtain MRI brain to better assess for Chiari I malformation Will prescribe a Medrol  Dosepak to take for the next 6 days Zomig  as needed for migraines Please follow-up with PCP regarding medication optimization. Return in 6 months or sooner if worse.   No orders of the defined types were placed in this encounter.   Meds ordered this encounter  Medications   methylPREDNISolone  (MEDROL  DOSEPAK) 4 MG TBPK tablet    Sig: As directed on the box    Dispense:  21 tablet    Refill:  0   zolmitriptan  (ZOMIG ) 5 MG tablet    Sig: Take 1 tablet (5 mg total) by mouth as needed for migraine.    Dispense:  10 tablet    Refill:  3    Return in about 6 months (around 02/08/2025).  I have spent a total of 65 minutes dedicated to this patient today, preparing to see patient, performing a medically  appropriate examination and evaluation, ordering tests and/or medications and procedures, and counseling and educating the patient/family/caregiver; independently interpreting result and communicating results to the family/patient/caregiver; and documenting clinical information in the electronic medical record.   Pastor Falling, MD 08/12/2024, 10:14 AM  [facility] [address], [address] [place], [place] [address] [phone]

## 2024-08-12 ENCOUNTER — Encounter: Payer: Self-pay | Admitting: Neurology

## 2024-08-12 MED ORDER — VEOZAH 45 MG PO TABS: 1.0000 | ORAL_TABLET | Freq: Every day | ORAL | 11 refills | Status: AC

## 2024-08-12 NOTE — Patient Instructions (Addendum)
 Will obtain MRI brain to better assess for Chiari I malformation Will prescribe a Medrol  Dosepak to take for the next 6 days Zomig  as needed for migraines Please follow-up with PCP regarding medication optimization. Return in 6 months or sooner if worse.

## 2024-08-12 NOTE — Telephone Encounter (Signed)
 Faxed over

## 2024-08-12 NOTE — Addendum Note (Signed)
 Addended by: Kinser Fellman G on: 08/12/2024 08:33 AM   Modules accepted: Orders

## 2024-08-12 NOTE — Telephone Encounter (Signed)
 Will get faxed over soon as provider signs it.

## 2024-08-17 ENCOUNTER — Other Ambulatory Visit: Payer: Self-pay | Admitting: Neurology

## 2024-08-17 ENCOUNTER — Telehealth: Payer: Self-pay | Admitting: Neurology

## 2024-08-17 ENCOUNTER — Ambulatory Visit: Admitting: Internal Medicine

## 2024-08-17 ENCOUNTER — Encounter: Payer: Self-pay | Admitting: Internal Medicine

## 2024-08-17 VITALS — BP 102/64 | HR 105 | Temp 98.0°F | Ht 66.0 in | Wt 246.2 lb

## 2024-08-17 DIAGNOSIS — G4453 Primary thunderclap headache: Secondary | ICD-10-CM

## 2024-08-17 DIAGNOSIS — N951 Menopausal and female climacteric states: Secondary | ICD-10-CM | POA: Diagnosis not present

## 2024-08-17 DIAGNOSIS — G935 Compression of brain: Secondary | ICD-10-CM

## 2024-08-17 DIAGNOSIS — G44209 Tension-type headache, unspecified, not intractable: Secondary | ICD-10-CM

## 2024-08-17 DIAGNOSIS — G43709 Chronic migraine without aura, not intractable, without status migrainosus: Secondary | ICD-10-CM

## 2024-08-17 DIAGNOSIS — G932 Benign intracranial hypertension: Secondary | ICD-10-CM | POA: Diagnosis not present

## 2024-08-17 DIAGNOSIS — R519 Headache, unspecified: Secondary | ICD-10-CM

## 2024-08-17 DIAGNOSIS — Z79899 Other long term (current) drug therapy: Secondary | ICD-10-CM

## 2024-08-17 DIAGNOSIS — I951 Orthostatic hypotension: Secondary | ICD-10-CM

## 2024-08-17 DIAGNOSIS — R202 Paresthesia of skin: Secondary | ICD-10-CM

## 2024-08-17 HISTORY — DX: Primary thunderclap headache: G44.53

## 2024-08-17 HISTORY — DX: Compression of brain: G93.5

## 2024-08-17 NOTE — Assessment & Plan Note (Signed)
 Chronic migraines are typically managed with Emgality , zonisamide, and topiramate . The current headache is unresponsive to usual migraine medications, likely due to Chiari malformation.

## 2024-08-17 NOTE — Assessment & Plan Note (Signed)
 Chiari malformation with spinal headache   Chiari malformation is causing a spinal headache with symptoms of severe headache rated 7-8/10, worsened by sitting up, and dizziness. CT angiography ruled out aneurysm. Prednisone  taper was ineffective. Surgical intervention was suggested by the neurologist, but she is hesitant. Inversion therapy is not recommended due to potential risk of worsening the condition. Data for treatment of chiari herniation related benign intracranial hypertension was reviewed with open evidence.  Outcome statistics indicate a 28% chance of improvement without a blood patch, increasing to 64% with the patch. follow MRI to assess the extent of Chiari malformation, but data indicates its safe to move forward with blood patch prior.. Refer urgently to neurosurgeon for evaluation and potential epidural blood patch. Advise bed rest, hydration, and caffeine intake to manage symptoms. Discussed potential for ER visit for intractable headache and epidural blood patch if symptoms worsen

## 2024-08-17 NOTE — Telephone Encounter (Signed)
 Done. Thanks.

## 2024-08-17 NOTE — Patient Instructions (Signed)
 It was a pleasure seeing you today! Your health and satisfaction are our top priorities.  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 We reviewed your current medications and symptoms, and we discussed potential next steps, including further imaging and specialist referrals.  YOUR PLAN: -CHIARI MALFORMATION WITH SPINAL HEADACHE: Chiari malformation is a condition where brain tissue extends into the spinal canal, which can cause severe headaches and dizziness. We will order an MRI to assess the extent of the malformation and refer you urgently to a neurosurgeon for evaluation and a potential epidural blood patch. In the meantime, please rest, stay hydrated, and continue your caffeine intake. If your symptoms worsen, consider visiting the ER for further management.  -CHRONIC MIGRAINE: Chronic migraines are severe headaches that occur frequently. Your current headache is not responding to your usual migraine medications, likely due to the Chiari malformation. We will focus on addressing the Chiari malformation to help alleviate your headaches.  -ORTHOSTATIC HYPOTENSION: Orthostatic hypotension is a form of low blood pressure that happens when you stand up from sitting or lying down, causing dizziness. This may be related to the high intracranial pressure from your Chiari malformation. We will monitor this condition as we address the Chiari malformation.  -MENOPAUSAL SYMPTOMS (HOT FLASHES): Menopausal symptoms like hot flashes are common and can be managed with hormone therapy. Continue your current estrogen and progesterone  therapy, and follow up with your gynecologist/menopause specialist as scheduled. We will also contact your insurance to resolve the coverage issues for [employer] .  -POLYPHARMACY: Polypharmacy refers to the use of multiple medications. We will refer you to a clinical  pharmacist to review your current medications and see if we can reduce the number of pills you need to take.  INSTRUCTIONS: Please follow up with the neurosurgeon urgently for evaluation and a potential epidural blood patch. Continue with bed rest, hydration, and caffeine intake to manage your symptoms. If your headaches become intractable, consider visiting the ER. Follow up with your gynecologist/menopause specialist as scheduled, and we will contact your insurance regarding Veozah  coverage. Additionally, schedule a consultation with a clinical pharmacist to review your medications.  Your Providers PCP: [name], MD,  [phone]) Referring Provider: [name], MD,  [phone]) Care Team Provider: Gorge Ade, MD,  [phone]) Care Team Provider: Domenica Reusing, MD,  [phone])  NEXT STEPS: [x]  Early Intervention: Schedule sooner appointment, call our on-call services, or go to emergency room if there is any significant Increase in pain or discomfort New or worsening symptoms Sudden or severe changes in your health [x]  Flexible Follow-Up: We recommend a No follow-ups on file. for optimal routine care. This allows for progress monitoring and treatment adjustments. [x]  Preventive Care: Schedule your annual preventive care visit! It's typically covered by insurance and helps identify potential health issues early. [x]  Lab & X-ray Appointments: Incomplete tests scheduled today, or call to schedule. X-rays: [facility] (M-F, 8:30am-noon or 1pm-5pm). [x]  Medical Information Release: Sign a release form at front desk to obtain relevant medical information we don't have.  MAKING THE MOST OF OUR FOCUSED 20 MINUTE APPOINTMENTS: [x]   Clearly state your top concerns at the beginning of the visit to focus our discussion [x]   If you anticipate you will need more time, please inform the front desk during scheduling - we can book multiple appointments in the  same week. [x]   If you have transportation problems-  use our convenient video appointments or ask about transportation support. [x]   We can get down to business faster if you use MyChart to update information before the visit and submit non-urgent questions before your visit. Thank you for taking the time to provide details through MyChart.  Let our nurse know and she can import this information into your encounter documents.  Arrival and Wait Times: [x]   Arriving on time ensures that everyone receives prompt attention. [x]   Early morning (8a) and afternoon (1p) appointments tend to have shortest wait times. [x]   Unfortunately, we cannot delay appointments for late arrivals or hold slots during phone calls.  Getting Answers and Following Up [x]   Simple Questions & Concerns: For quick questions or basic follow-up after your visit, reach us  at [phone] or MyChart messaging. [x]   Complex Concerns: If your concern is more complex, scheduling an appointment might be best. Discuss this with the staff to find the most suitable option. [x]   Lab & Imaging Results: We'll contact you directly if results are abnormal or you don't use MyChart. Most normal results will be on MyChart within 2-3 business days, with a review message from Dr. Jesus. Haven't heard back in 2 weeks? Need results sooner? Contact us  at [phone]. [x]   Referrals: Our referral coordinator will manage specialist referrals. The specialist's office should contact you within 2 weeks to schedule an appointment. Call us  if you haven't heard from them after 2 weeks.  Staying Connected [x]   MyChart: Activate your MyChart for the fastest way to access results and message us . See the last page of this paperwork for instructions on how to activate.  Bring to Your Next Appointment [x]   Medications: Please bring all your medication bottles to your next appointment to ensure we have an accurate record of your prescriptions. [x]   Health  Diaries: If you're monitoring any health conditions at home, keeping a diary of your readings can be very helpful for discussions at your next appointment.  Billing [x]   X-ray & Lab Orders: These are billed by separate companies. Contact the invoicing company directly for questions or concerns. [x]   Visit Charges: Discuss any billing inquiries with our administrative services team.  Your Satisfaction Matters [x]   Share Your Experience: We strive for your satisfaction! If you have any complaints, or preferably compliments, please let Dr. Jesus know directly or contact our Practice Administrators, Manuelita Rubin or [employer], by asking at the front desk.   Reviewing Your Records [x]   Review this early draft of your clinical encounter notes below and the final encounter summary tomorrow on MyChart after its been completed.  All orders placed so far are visible here: Benign intracranial hypertension -     AMB Referral [facility] Care Management -     Ambulatory referral to Neurosurgery  Chiari I malformation (HCC) -     AMB Referral [facility] Care Management -     Ambulatory referral to Neurosurgery  Menopausal syndrome (hot flushes) -     AMB Referral [facility] Care Management  Thunderclap headache  Intractable episodic headache, unspecified headache type  Chronic migraine without aura without status migrainosus, not intractable  Orthostatic hypotension  Polypharmacy

## 2024-08-17 NOTE — Telephone Encounter (Signed)
 Pt called to follow up about MRI , that MD is suppose to place for pt  .

## 2024-08-17 NOTE — Assessment & Plan Note (Signed)
 Persistent hot flashes continue despite current estrogen and progesterone  therapy. Veozah  prescription issues are due to insurance coverage. Referral to gynecologist/menopause specialist is already in place. Continue current hormone therapy. Contact insurance for Ve[employer]ah  coverage issues. Follow up with gynecologist/menopause specialist as scheduled.

## 2024-08-17 NOTE — Progress Notes (Signed)
 ==============================  [facility]: [phone]   -- Medical Office Visit --  Patient: Diane [name]      Age: 43 y.o.       Sex:  female  Date:   08/17/2024 Today's Healthcare Provider: Bernardino KANDICE Cone, MD  ==============================   Chief Complaint: Medication Refill (Would like to see clinical pharmacist )   Discussed the use of AI scribe software for clinical note transcription with the patient, who gave verbal consent to proceed.  History of Present Illness 44 year old female with Chiari malformation who presents with persistent headaches and dizziness.  She experiences severe headaches, rated 7 to 8 out of 10, which are not relieved by her usual migraine medications, including Emgality , zonisamide, and topiramate . A prednisone  taper was also ineffective. The headaches are described as 'thunderclap' in nature, starting suddenly during movement, and are exacerbated by sitting up.  She has a newly lower than usual blood pressure, recently recorded at 95/54 mmHg, which is unusually low for her. She experiences dizziness, described as 'room spinning,' and lightheadedness, particularly when walking downstairs. Her blood pressure has been low since her last visit to the neurologist.  A CT angiogram of the brain was performed in the ER. She is awaiting an MRI to further evaluate the Chiari malformation and its impact. She has a family history of aneurysms, which is a concern for her.  She is currently taking gabapentin  and uses a muscle relaxer for some relief when stationary, although it does not completely alleviate her symptoms. She drinks one to two Dr. Buster a day for caffeine intake.  She is concerned about the impact of her symptoms on her daily life, including driving, as she experiences dizziness and lightheadedness. She is also worried about the potential need for surgery.  The situation all began a few weeks ago when she  was moving from prone to sitting she felt thunderclap headache(s) onset- she had negative CT angiogram and lumbar puncture was deferred- upon seeing neurologist it seemed to be chiari malformation herniation related, MRI was ordered, pain persists, nothing giving relief yet.  BP Readings from Last 30 Encounters:  08/17/24 102/64  08/11/24 (!) 92/54  07/30/24 127/84  07/29/24 100/76  07/20/24 120/82  06/17/24 132/82  05/24/24 (!) 167/99  03/08/24 134/80  03/02/24 (!) 146/96  02/29/24 131/67  02/08/24 118/70  02/02/24 114/73  01/25/24 (!) 138/95  01/12/24 127/78  11/21/23 139/86  07/14/23 120/75  06/20/23 114/73  06/17/23 101/64  10/16/22 125/79  09/01/22 (!) 161/96  08/08/21 103/62  07/26/21 110/80  07/20/21 104/62  07/14/20 113/67  08/10/19 121/82  04/16/19 110/66  04/06/19 130/89  04/05/19 120/84  03/18/19 123/75  12/02/18 126/79   Background Reviewed: Problem List: has Depressive disorder; Anxiety state; Opioid use disorder in remission; Benign intracranial hypertension; back pain, chronic; Knee pain, right, chronic; Ankle pain, chronic; OSA (obstructive sleep apnea); Chronic migraine without aura; Osteochondral lesion; Dysphagia; Intractable pain; Chronic constipation; Other idiopathic scoliosis, lumbar region; Obesity (BMI 30-39.9); Hyperlipidemia; Menopausal syndrome (hot flushes); Prediabetes; Ankle swelling; 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 Past Medical History:  has a past medical history of Acute injury of left knee cartilage (05/24/2024), ADD (attention deficit disorder with hyperactivity), Allergy , Anxiety, Arthritis, Atypical chest  pain (07/26/2021), Benign intracranial hypertension (06/16/2008),  Choking, Cholelithiasis and acute cholecystitis without obstruction (12/02/2016), Chronic cough (12/27/2013), Depression, Dry mouth (08/12/2017), Dysmenorrhea, Family history of adverse reaction to anesthesia, Gastric ulcer (03/16/2015), GERD (gastroesophageal reflux disease), Headache (07/09/2007), History of gastric ulcer, History of kidney stones, Hyperlipidemia, IBS (irritable bowel syndrome), Lower extremity edema (07/26/2021), Migraines, Mild obstructive sleep apnea, Nausea and vomiting (12/03/2016), OAB (overactive bladder) (08/12/2017), Osteoarthritis, Osteochondral lesion (09/2016), Osteomyelitis of knee region [facility]), Peripheral vascular disease, Plantar fasciitis of left foot, Pleural effusion (12/15/2013), PONV (postoperative nausea and vomiting), Pseudotumor cerebri, RUQ pain, S/P dilatation of esophageal stricture (07/2018), Sinusitis (08/12/2018), SUI (stress urinary incontinence, female), Wears contact lenses, and Wears partial dentures. Past Surgical History:   has a past surgical history that includes Cesarean section (12/24/2009); Cesarean section (11/20/2011); laparoscopic appendectomy (N/A, 06/11/2014); Bladder surgery (child); Wisdom tooth extraction; Ovarian cyst surgery (Right, 03/05/2007); Cystoscopy with retrograde pyelogram, ureteroscopy and stent placement (Left, 08/10/2006); Synovectomy (Right, 12/13/2014); Knee arthroscopy with lateral release (Right, 12/13/2014); ORIF ankle fracture (Right, 01/05/2016); Hardware Removal (Right, 07/01/2016); Ankle arthroscopy (Right, 10/22/2016); Cholecystectomy (N/A, 12/03/2016); Ankle arthroscopy (Right, 09-15-2017   @[facility] ); Tonsillectomy and adenoidectomy (child); Esophagogastroduodenoscopy (egd) with esophageal dilation (07/2018); Robotic assisted laparoscopic hysterectomy and salpingectomy (Bilateral, 08/27/2018); Chondroplasty (Right, 01/12/2024); Harvest bone graft (Right, 01/12/2024); Implantation, matrix-induced autologous chondrocytes (Left, 05/24/2024);  and Knee arthroscopy (Left, 05/24/2024). Social History:   reports that she has quit smoking. Her smoking use included cigarettes. She has a 7.5 pack-year smoking history. She has never used smokeless tobacco. She reports current alcohol use. She reports that she does not use drugs. Family History:  family history includes Alcohol abuse in her father and maternal grandfather; Anesthesia problems in her father; Arthritis in her father, maternal grandfather, maternal grandmother, mother, and paternal grandmother; Asthma in her maternal grandmother and mother; Brain cancer in her father; Breast cancer in her maternal grandmother; COPD in her paternal grandmother; Cancer in her father, maternal grandfather, and maternal grandmother; Cancer - Prostate in her maternal grandfather; Depression in her father; Diabetes in her maternal grandmother; Drug abuse in her father; Early death in her father; Heart attack in her maternal grandmother; Heart disease in her maternal grandmother; Hyperlipidemia in her maternal grandfather and maternal grandmother; Hypertension in her father, maternal grandfather, maternal grandmother, and paternal grandmother; Irritable bowel syndrome in her mother; Learning disabilities in her daughter, mother, and son; Lung cancer in her father; Melanoma in her maternal grandfather; Miscarriages / [place] in her mother; Ovarian cancer in her maternal grandmother; Prostate cancer in her maternal grandfather. Allergies:  is allergic to droperidol , metoclopramide , nickel, prochlorperazine, nsaids, tolmetin, oxybutynin , adhesive [tape], metoclopramide  hcl, prochlorperazine edisylate, and sulfa antibiotics.   Medication Reconciliation: Current Outpatient Medications on File Prior to Visit  Medication Sig   ADDERALL XR 20 MG 24 hr capsule Take 20 mg by mouth every morning.   albuterol  (PROVENTIL  HFA;VENTOLIN  HFA) 108 (90 Base) MCG/ACT inhaler Inhale 2 puffs into the lungs every 6 (six) hours as  needed for wheezing or shortness of breath.    ALPRAZolam (XANAX) 0.25 MG tablet SMARTSIG:0.5 Tablet(s) By Mouth PRN   amoxicillin -clavulanate (AUGMENTIN ) 875-125 MG tablet Take 1 tablet by mouth 2 (two) times daily. (Patient not taking: Reported on 08/11/2024)   aspirin  EC 325 MG tablet Take 1 tablet (325 mg total) by mouth daily. (Patient not taking: Reported on 08/11/2024)   atorvastatin  (LIPITOR) 40 MG tablet TAKE 1 TABLET BY MOUTH EVERY DAY   Bacillus Coagulans-Inulin (PROBIOTIC) 1-250 BILLION-MG CAPS    cetirizine (  ZYRTEC) 10 MG tablet Take by mouth.   diclofenac  Sodium (VOLTAREN ) 1 % GEL Apply 4 g topically 4 (four) times daily.   EMGALITY  120 MG/ML SOAJ Inject 120 mg into the skin every 30 (thirty) days.   estradiol  (VIVELLE -DOT) 0.05 MG/24HR patch Place 1 patch (0.05 mg total) onto the skin 2 (two) times a week.   Fezolinetant  (VEOZAH ) 45 MG TABS Take 1 tablet (45 mg total) by mouth daily at 6 (six) AM.   Fezolinetant  (VEOZAH ) 45 MG TABS Take 1 tablet (45 mg total) by mouth daily at 6 (six) AM.   fluconazole  (DIFLUCAN ) 150 MG tablet Take 1 tablet by mouth. If symptoms persist, may take an additional tablet in 3-5 days. May repeat for a total of 3 tablets. (Patient not taking: Reported on 08/11/2024)   fluticasone  (FLONASE ) 50 MCG/ACT nasal spray Place 2 sprays into both nostrils daily.   gabapentin  (NEURONTIN ) 300 MG capsule TAKE 2 CAPSULES BY MOUTH 3 TIMES DAILY (Patient taking differently: Take 600-1,200 mg by mouth See admin instructions. Take 600mg  by mouth in the morning and 1,200mg  in the evening)   lubiprostone (AMITIZA) 24 MCG capsule TAKE ONE CAPSULE (24 MCG DOSE) BY MOUTH 2 (TWO) TIMES DAILY WITH MEALS.   magnesium  oxide (MAG-OX) 400 MG tablet Take by mouth.   meclizine  (ANTIVERT ) 25 MG tablet Take 1 tablet (25 mg total) by mouth 3 (three) times daily as needed for dizziness.   MELATONIN GUMMIES PO Take 1-2 each by mouth at bedtime.   methylPREDNISolone  (MEDROL  DOSEPAK) 4 MG TBPK  tablet As directed on the box   mirtazapine  (REMERON ) 15 MG tablet Take 1 tablet (15 mg total) by mouth daily.   Multiple Vitamins-Minerals (EQL [facility] WOMENS) TABS    NUCYNTA 100 MG TABS Take 1 tablet by mouth every 8 (eight) hours as needed.   omeprazole (PRILOSEC) 40 MG capsule Take 40 mg by mouth daily.   ondansetron  (ZOFRAN -ODT) 8 MG disintegrating tablet Take 1 tablet (8 mg total) by mouth every 8 (eight) hours as needed.   PARoxetine  (PAXIL ) 10 MG tablet Take 1 tablet (10 mg total) by mouth daily. Replaces Effexor , for hot flushes, switching out will be not feeling good for a few days (Patient taking differently: Take 5 mg by mouth daily. Replaces Effexor , for hot flushes, switching out will be not feeling good for a few days)   progesterone  (PROMETRIUM ) 100 MG capsule Take 3 capsules (300 mg total) by mouth daily.   promethazine  (PHENERGAN ) 25 MG tablet Take 1 tablet (25 mg total) by mouth every 8 (eight) hours as needed for nausea or vomiting.   senna-docusate (SENOKOT-S) 8.6-50 MG tablet Take 1 tablet by mouth daily.   tiZANidine  (ZANAFLEX ) 4 MG tablet Take 4 mg by mouth every 6 (six) hours as needed for muscle spasms.    topiramate  (TOPAMAX ) 25 MG tablet Take 25 mg by mouth daily.   traZODone (DESYREL) 50 MG tablet Take 1-2 tablets by mouth at bedtime as needed.   triamcinolone  cream (KENALOG ) 0.1 % APPLY TO AFFECTED AREA TWICE A DAY   venlafaxine  XR (EFFEXOR -XR) 150 MG 24 hr capsule TAKE 2 CAPS BY MOUTH DAILY   zolmitriptan  (ZOMIG ) 5 MG tablet Take 1 tablet (5 mg total) by mouth as needed for migraine.   zonisamide (ZONEGRAN) 100 MG capsule Take 100 mg by mouth at bedtime.   No current facility-administered medications on file prior to visit.  There are no discontinued medications.   Physical Exam:    08/17/2024  11:06 AM 08/11/2024    3:40 PM 07/30/2024    4:15 AM  Vitals with BMI  Height 5' 6 5' 6   Weight 246 lbs 3 oz 250 lbs 8 oz   BMI 39.76 40.45   Systolic 102 92 127   Diastolic 64 54 84  Pulse 105 114 78  Vital signs reviewed.  Nursing notes reviewed. Weight trend reviewed. Physical Activity: Not on file   General Appearance:  No acute distress appreciable.   Well-groomed, healthy-appearing female.  Well proportioned with no abnormal fat distribution.  Good muscle tone. Pulmonary:  Normal work of breathing at rest, no respiratory distress apparent. SpO2: 97 %  Musculoskeletal: All extremities are intact.  Neurological:  Awake, alert, oriented, and engaged.  No obvious focal neurological deficits or cognitive impairments.  Sensorium seems unclouded.   Speech is clear and coherent with logical content. Psychiatric:  Appropriate mood, pleasant and cooperative demeanor, thoughtful and engaged during the exam   Verbalized to patient: Physical Exam    Results:   Verbalized to patient: Results RADIOLOGY CT Angiography of the Brain: Chiari malformation with cerebellar tonsillar herniation into the foramen magnum, tight fit of brain tissue at the craniovertebral junction, no evidence of aneurysm.     06/17/2024    1:13 PM 03/08/2024   10:54 AM 06/17/2023   10:20 AM 08/12/2017    2:45 PM  PHQ 2/9 Scores  PHQ - 2 Score 1 0 2 0  PHQ- 9 Score 3 0 7 0    Admission on 07/29/2024, Discharged on 07/30/2024  Component Date Value Ref Range Status   Sodium 07/29/2024 136  135 - 145 mmol/L Final   Potassium 07/29/2024 3.8  3.5 - 5.1 mmol/L Final   Chloride 07/29/2024 101  98 - 111 mmol/L Final   CO2 07/29/2024 21 (L)  22 - 32 mmol/L Final   Glucose, Bld 07/29/2024 106 (H)  70 - 99 mg/dL Final   BUN 90/87/7974 7  6 - 20 mg/dL Final   Creatinine, Ser 07/29/2024 0.86  0.44 - 1.00 mg/dL Final   Calcium  07/29/2024 9.1  8.9 - 10.3 mg/dL Final   GFR, Estimated 07/29/2024 >60  >60 mL/min Final   Anion gap 07/29/2024 14  5 - 15 Final   WBC 07/29/2024 8.3  4.0 - 10.5 K/uL Final   RBC 07/29/2024 4.49  3.87 - 5.11 MIL/uL Final   Hemoglobin 07/29/2024 12.1  12.0 -  15.0 g/dL Final   HCT 90/87/7974 38.7  36.0 - 46.0 % Final   MCV 07/29/2024 86.2  80.0 - 100.0 fL Final   MCH 07/29/2024 26.9  26.0 - 34.0 pg Final   MCHC 07/29/2024 31.3  30.0 - 36.0 g/dL Final   RDW 90/87/7974 14.6  11.5 - 15.5 % Final   Platelets 07/29/2024 362  150 - 400 K/uL Final   nRBC 07/29/2024 0.0  0.0 - 0.2 % Final   Neutrophils Relative % 07/29/2024 54  % Final   Neutro Abs 07/29/2024 4.4  1.7 - 7.7 K/uL Final   Lymphocytes Relative 07/29/2024 35  % Final   Lymphs Abs 07/29/2024 3.0  0.7 - 4.0 K/uL Final   Monocytes Relative 07/29/2024 6  % Final   Monocytes Absolute 07/29/2024 0.5  0.1 - 1.0 K/uL Final   Eosinophils Relative 07/29/2024 4  % Final   Eosinophils Absolute 07/29/2024 0.4  0.0 - 0.5 K/uL Final   Basophils Relative 07/29/2024 1  % Final   Basophils Absolute 07/29/2024 0.1  0.0 -  0.1 K/uL Final   Immature Granulocytes 07/29/2024 0  % Final   Abs Immature Granulocytes 07/29/2024 0.02  0.00 - 0.07 K/uL Final  Office Visit on 03/02/2024  Component Date Value Ref Range Status   D-Dimer, Quant 03/02/2024 1.69 (H)  <0.50 mcg/mL FEU Final  Admission on 02/29/2024, Discharged on 03/01/2024  Component Date Value Ref Range Status   Sodium 02/29/2024 139  135 - 145 mmol/L Final   Potassium 02/29/2024 3.5  3.5 - 5.1 mmol/L Final   Chloride 02/29/2024 103  98 - 111 mmol/L Final   CO2 02/29/2024 26  22 - 32 mmol/L Final   Glucose, Bld 02/29/2024 112 (H)  70 - 99 mg/dL Final   BUN 95/85/7974 9  6 - 20 mg/dL Final   Creatinine, Ser 02/29/2024 0.71  0.44 - 1.00 mg/dL Final   Calcium  02/29/2024 8.8 (L)  8.9 - 10.3 mg/dL Final   GFR, Estimated 02/29/2024 >60  >60 mL/min Final   Anion gap 02/29/2024 10  5 - 15 Final   WBC 02/29/2024 8.9  4.0 - 10.5 K/uL Final   RBC 02/29/2024 4.48  3.87 - 5.11 MIL/uL Final   Hemoglobin 02/29/2024 12.6  12.0 - 15.0 g/dL Final   HCT 95/85/7974 38.6  36.0 - 46.0 % Final   MCV 02/29/2024 86.2  80.0 - 100.0 fL Final   MCH 02/29/2024 28.1  26.0 -  34.0 pg Final   MCHC 02/29/2024 32.6  30.0 - 36.0 g/dL Final   RDW 95/85/7974 13.9  11.5 - 15.5 % Final   Platelets 02/29/2024 313  150 - 400 K/uL Final   nRBC 02/29/2024 0.0  0.0 - 0.2 % Final   Troponin I (High Sensitivity) 02/29/2024 3  <18 ng/L Final   TSH 02/29/2024 1.090  0.350 - 4.500 uIU/mL Final  Office Visit on 02/02/2024  Component Date Value Ref Range Status   Albumin 02/02/2024 4.1  3.9 - 4.9 g/dL Final   Dehydroepiandrosterone 02/02/2024 38  31 - 701 ng/dL Final   LH 96/81/7974 65.0  mIU/mL Final   FSH 02/02/2024 24.3  mIU/mL Final   Prolactin 02/02/2024 16.9  4.8 - 33.4 ng/mL Final   Progesterone  02/02/2024 0.3  ng/mL Final   Estrogen 02/02/2024 276  pg/mL Final   Sex Hormone Binding 02/02/2024 31.8  24.6 - 122.0 nmol/L Final   Vit D, 25-Hydroxy 02/02/2024 44.8  30.0 - 100.0 ng/mL Final  Office Visit on 09/09/2023  Component Date Value Ref Range Status   Rapid Strep A Screen 09/09/2023 Positive (A)  Negative Final   Influenza A, POC 09/09/2023 Negative  Negative Final   Influenza B, POC 09/09/2023 Negative  Negative Final   SARS Coronavirus 2 Ag 09/09/2023 Negative  Negative Final  Office Visit on 07/13/2023  Component Date Value Ref Range Status   Hemoglobin A1C 07/13/2023 5.8 (A)  4.0 - 5.6 % Final  Admission on 06/20/2023, Discharged on 06/20/2023  Component Date Value Ref Range Status   Lipase 06/20/2023 26  11 - 51 U/L Final   Sodium 06/20/2023 138  135 - 145 mmol/L Final   Potassium 06/20/2023 3.7  3.5 - 5.1 mmol/L Final   Chloride 06/20/2023 103  98 - 111 mmol/L Final   CO2 06/20/2023 27  22 - 32 mmol/L Final   Glucose, Bld 06/20/2023 119 (H)  70 - 99 mg/dL Final   BUN 91/96/7975 10  6 - 20 mg/dL Final   Creatinine, Ser 06/20/2023 0.75  0.44 - 1.00 mg/dL Final  Calcium  06/20/2023 9.4  8.9 - 10.3 mg/dL Final   Total Protein 91/96/7975 7.3  6.5 - 8.1 g/dL Final   Albumin 91/96/7975 4.0  3.5 - 5.0 g/dL Final   AST 91/96/7975 28  15 - 41 U/L Final   ALT  06/20/2023 23  0 - 44 U/L Final   Alkaline Phosphatase 06/20/2023 108  38 - 126 U/L Final   Total Bilirubin 06/20/2023 0.3  0.3 - 1.2 mg/dL Final   GFR, Estimated 06/20/2023 >60  >60 mL/min Final   Anion gap 06/20/2023 8  5 - 15 Final   WBC 06/20/2023 4.8  4.0 - 10.5 K/uL Final   RBC 06/20/2023 4.20  3.87 - 5.11 MIL/uL Final   Hemoglobin 06/20/2023 11.6 (L)  12.0 - 15.0 g/dL Final   HCT 91/96/7975 35.3 (L)  36.0 - 46.0 % Final   MCV 06/20/2023 84.0  80.0 - 100.0 fL Final   MCH 06/20/2023 27.6  26.0 - 34.0 pg Final   MCHC 06/20/2023 32.9  30.0 - 36.0 g/dL Final   RDW 91/96/7975 13.8  11.5 - 15.5 % Final   Platelets 06/20/2023 263  150 - 400 K/uL Final   nRBC 06/20/2023 0.0  0.0 - 0.2 % Final   Color, Urine 06/20/2023 YELLOW  YELLOW Final   APPearance 06/20/2023 CLEAR  CLEAR Final   Specific Gravity, Urine 06/20/2023 >1.046 (H)  1.005 - 1.030 Final   pH 06/20/2023 7.5  5.0 - 8.0 Final   Glucose, UA 06/20/2023 NEGATIVE  NEGATIVE mg/dL Final   Hgb urine dipstick 06/20/2023 NEGATIVE  NEGATIVE Final   Bilirubin Urine 06/20/2023 NEGATIVE  NEGATIVE Final   Ketones, ur 06/20/2023 NEGATIVE  NEGATIVE mg/dL Final   Protein, ur 91/96/7975 TRACE (A)  NEGATIVE mg/dL Final   Nitrite 91/96/7975 NEGATIVE  NEGATIVE Final   Leukocytes,Ua 06/20/2023 NEGATIVE  NEGATIVE Final  Office Visit on 06/17/2023  Component Date Value Ref Range Status   Hepatitis C Ab 06/17/2023 NON-REACTIVE  NON-REACTIVE Final   WBC 06/17/2023 6.0  4.0 - 10.5 K/uL Final   RBC 06/17/2023 4.50  3.87 - 5.11 Mil/uL Final   Platelets 06/17/2023 300.0  150.0 - 400.0 K/uL Final   Hemoglobin 06/17/2023 12.3  12.0 - 15.0 g/dL Final   HCT 92/68/7975 37.6  36.0 - 46.0 % Final   MCV 06/17/2023 83.7  78.0 - 100.0 fl Final   MCHC 06/17/2023 32.6  30.0 - 36.0 g/dL Final   RDW 92/68/7975 14.1  11.5 - 15.5 % Final   Sodium 06/17/2023 138  135 - 145 mEq/L Final   Potassium 06/17/2023 4.3  3.5 - 5.1 mEq/L Final   Chloride 06/17/2023 104  96 -  112 mEq/L Final   CO2 06/17/2023 26  19 - 32 mEq/L Final   Glucose, Bld 06/17/2023 89  70 - 99 mg/dL Final   BUN 92/68/7975 13  6 - 23 mg/dL Final   Creatinine, Ser 06/17/2023 0.84  0.40 - 1.20 mg/dL Final   Total Bilirubin 06/17/2023 0.3  0.2 - 1.2 mg/dL Final   Alkaline Phosphatase 06/17/2023 119 (H)  39 - 117 U/L Final   AST 06/17/2023 30  0 - 37 U/L Final   ALT 06/17/2023 27  0 - 35 U/L Final   Total Protein 06/17/2023 7.8  6.0 - 8.3 g/dL Final   Albumin 92/68/7975 4.3  3.5 - 5.2 g/dL Final   GFR 92/68/7975 86.68  >60.00 mL/min Final   Calcium  06/17/2023 9.4  8.4 - 10.5 mg/dL Final  Cholesterol 06/17/2023 157  0 - 200 mg/dL Final   Triglycerides 92/68/7975 184.0 (H)  0.0 - 149.0 mg/dL Final   HDL 92/68/7975 37.20 (L)  >60.99 mg/dL Final   VLDL 92/68/7975 36.8  0.0 - 40.0 mg/dL Final   LDL Cholesterol 06/17/2023 83  0 - 99 mg/dL Final   Total CHOL/HDL Ratio 06/17/2023 4   Final   NonHDL 06/17/2023 120.15   Final   HIV 1&2 Ab, 4th Generation 06/17/2023 NON-REACTIVE  NON-REACTIVE Final   TSH 06/17/2023 1.62  0.35 - 5.50 uIU/mL Final  Admission on 09/01/2022, Discharged on 09/01/2022  Component Date Value Ref Range Status   SARS Coronavirus 2 by RT PCR 09/01/2022 NEGATIVE  NEGATIVE Final   Influenza A by PCR 09/01/2022 POSITIVE (A)  NEGATIVE Final   Influenza B by PCR 09/01/2022 NEGATIVE  NEGATIVE Final   Group A Strep by PCR 09/01/2022 NOT DETECTED  NOT DETECTED Final  No image results found. CT Angio Head Neck W WO CM Result Date: 07/29/2024 CLINICAL DATA:  Initial evaluation for acute neuro deficit, stroke suspected. Headache. EXAM: CT ANGIOGRAPHY HEAD AND NECK WITH AND WITHOUT CONTRAST TECHNIQUE: Multidetector CT imaging of the head and neck was performed using the standard protocol during bolus administration of intravenous contrast. Multiplanar CT image reconstructions and MIPs were obtained to evaluate the vascular anatomy. Carotid stenosis measurements (when applicable) are  obtained utilizing NASCET criteria, using the distal internal carotid diameter as the denominator. RADIATION DOSE REDUCTION: This exam was performed according to the departmental dose-optimization program which includes automated exposure control, adjustment of the mA and/or kV according to patient size and/or use of iterative reconstruction technique. CONTRAST:  75mL OMNIPAQUE  IOHEXOL  350 MG/ML SOLN COMPARISON:  Prior study from 11/21/2023. FINDINGS: CT HEAD FINDINGS Brain: Cerebral volume within normal limits for patient age. No acute intracranial hemorrhage. No acute large vessel territory infarct. No mass lesion, midline shift, or mass effect. Ventricles are normal in size without hydrocephalus. No extra-axial fluid collection. Mild cerebellar tonsillar ectopia without overt Chiari malformation noted. Vascular: No abnormal hyperdense vessel. Skull: Scalp soft tissues demonstrate no acute abnormality. Calvarium intact. Sinuses/Orbits: Globes and orbital soft tissues within normal limits. Visualized paranasal sinuses are largely clear. No significant mastoid effusion. CTA NECK FINDINGS Aortic arch: Standard branching. Imaged portion shows no evidence of aneurysm or dissection. No significant stenosis of the major arch vessel origins. Right carotid system: No evidence of dissection, stenosis (50% or greater), or occlusion. Left carotid system: No evidence of dissection, stenosis (50% or greater), or occlusion. Vertebral arteries: No evidence of dissection, stenosis (50% or greater), or occlusion. Skeleton: No discrete or worrisome osseous lesions. Patient is largely edentulous. Other neck: No other acute finding. Upper chest: No other acute finding. Review of the MIP images confirms the above findings CTA HEAD FINDINGS Anterior circulation: Both internal carotid arteries widely patent to the termini without stenosis. A1 segments widely patent. Normal anterior communicating artery complex. Both anterior cerebral  arteries widely patent to their distal aspects without stenosis. No M1 stenosis or occlusion. Normal MCA bifurcations. Distal MCA branches well perfused and symmetric. Posterior circulation: Both V4 segments patent without stenosis. Both PICA patent. Basilar patent without stenosis. Superior cerebral arteries patent bilaterally. Right PCA supplied via the basilar. Fetal type origin left PCA. Both PCAs patent without stenosis. Venous sinuses: Patent allowing for timing the contrast bolus. Anatomic variants: As above.  No aneurysm. Review of the MIP images confirms the above findings IMPRESSION: 1. Normal CTA of the head and  neck. No large vessel occlusion, hemodynamically significant stenosis, or other acute vascular abnormality. No aneurysm. 2. No other acute intracranial abnormality. Electronically Signed   By: Morene Hoard M.D.   On: 07/29/2024 19:44   MR Knee Right Wo Contrast Result Date: 07/26/2024 MR KNEE WITHOUT IV CONTRAST RIGHT COMPARISON: 12/10/2023 CLINICAL HISTORY: Meniscal injury, knee. Acute knee pain. History of prior knee surgery. PULSE SEQUENCES: Ax PD FS, Sag T2 ACL, Sag PD FS, Cor PD FS & COR T1 FINDINGS: Bones: There is improved edema in the lateral femoral condyle with compared with the prior examination. 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 There is no fracture or contusion pattern. 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 This has progressed when compared with the prior examination. Evaluation is somewhat limited secondary to motion. Stable chondromalacia the medial and lateral compartments. Extensor mechanism is intact. There is a moderate joint effusion. Ligaments: Mild mucoid degeneration is seen in the ACL with intact fibers. The PCL, MCL and fibular collateral ligaments are  intact. Menisci: Medial and lateral menisci are unremarkable. No discrete meniscal tear is present. 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 Reactive edema in the posterior lateral femoral condyle. Small reactive joint effusion. Progressive cartilage loss in the central and lateral patella as above. Correlation for anterior knee symptoms. No ligamentous or meniscal injury. 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 CT angiography ruled out aneurysm. Prednisone  taper was ineffective. Surgical intervention was suggested by the neurologist, but she is hesitant. Inversion therapy is not recommended due to potential risk of worsening the condition. Data for treatment of chiari herniation related benign intracranial hypertension was reviewed with open evidence.  Outcome statistics indicate a 28% chance of improvement without a blood patch, increasing to 64% with the patch. follow MRI to assess the extent of Chiari malformation, but data indicates its safe to move forward with blood patch prior.. Refer urgently to neurosurgeon for evaluation and potential epidural blood patch. Advise bed rest, hydration, and caffeine intake to manage symptoms. Discussed potential for ER visit for intractable headache and epidural blood patch if symptoms worsen Menopausal syndrome (hot flushes) Persistent hot flashes continue despite current estrogen and progesterone  therapy. [place]  prescription issues are due to insurance coverage. Referral to gynecologist/menopause specialist is already in  place. Continue current hormone therapy. Contact insurance for [place]  coverage issues. Follow up with gynecologist/menopause specialist as scheduled. Chronic migraine without aura without status migrainosus, not intractable Chronic migraines are typically managed with Emgality , zonisamide, and topiramate . The current headache is unresponsive to usual migraine medications, likely due to Chiari malformation. Orthostatic hypotension Recent episodes of dizziness and low blood pressure (95/54 mmHg) are possibly related to high intracranial pressure from Chiari malformation.  Likely secondary to the benign intracranial hypertension. ?CSF leak which are difficult to detect even on MRI. Polypharmacy The neurologist recommended consultation with a clinical pharmacist to review the current medication regimen and reduce pill burden if possible. Refer to clinical pharmacist for medication  review.  ORDER ASSOCIATIONS  #   DIAGNOSIS / CONDITION ICD-10 ENCOUNTER ORDER     ICD-10-CM   1. Benign intracranial hypertension  G93.2 AMB Referral [facility] Care Management    Ambulatory referral to Neurosurgery    CANCELED: Ambulatory referral to Neurosurgery    2. Chiari I malformation (HCC)  G93.5 AMB Referral [facility] Care Management    Ambulatory referral to Neurosurgery    CANCELED: Ambulatory referral to Neurosurgery    3. Menopausal syndrome (hot flushes)  N95.1 AMB Referral [facility] Care Management    4. Thunderclap headache  G44.53     5. Intractable episodic headache, unspecified headache type  R51.9     6. Chronic migraine without aura without status migrainosus, not intractable  G43.709     7. Orthostatic hypotension  I95.1     8. Polypharmacy  Z79.899            Orders Placed in Encounter:        Ambulatory referral to Neurosurgery  Status:  Canceled       Comments: Please Select To Department: [facility] for Nerve or Spine  Please select To Department: [facility] AT   for Cranial or Neurovascular  ### Urgent Neurosurgery Referral     This patient presents with a severe, persistent orthostatic headache that is refractory to conservative management and migraine-directed therapies. She has a known history of Chiari malformation and her symptoms began acutely with postural change, remaining unremitting for several weeks. The clinical picture is highly concerning for spontaneous intracranial hypotension (SIH) secondary to spinal cerebrospinal fluid (CSF) leak, which can mimic or coexist with Chiari I malformation and is frequently underdiagnosed.[1][2][3]      SIH and Chiari I malformation share overlapping MRI features, including low-lying cerebellar tonsils and brainstem sagging, making differentiation critical to avoid misdiagnosis and inappropriate treatment pathways.[1][2][3] The urgency is underscored by the risk of ongoing neurological morbidity, as well as the potential for rapid symptom resolution with targeted intervention such as epidural blood patching or, in refractory cases, surgical repair.[4][5]      Current consensus from the World [employer] Soci[employer] Spine Committee and recent systematic reviews recommend expedited neurosurgical evaluation for patients with symptomatic Chiari malformation or progressive symptoms, especially when SIH is suspected and conservative measures have failed.[6][7] Early diagnosis and intervention are associated with improved outcomes and may prevent unnecessary surgical decompression for Chiari malformation when the true etiology is a CSF leak.[1][2][3]      Given the severity, chronicity, and disabling nature of her symptoms, as well as the diagnostic complexity, an urgent neurosurgical assessment is warranted to guide further imaging, leak localization, and definitive management. Please prioritize this referral for expedited evaluation and intervention.      ### References  1. Cerebrospinal Fluid Leaks,  Spontaneous Intracranial Hypotension, and Chiari I Malformation. Von SAUNDERS, Cutsforth-Gregory JK, Brinjikji W. [facility] [place]. 2023;34(1):185-192. doi:10.1016/j.nec.2022.08.012. 2. Overview of Spontaneous Intracranial Hypotension and Differential Diagnosis With Chiari I Malformation. Bin Wan Hassan WMN, Mistretta F, Molinaro S, et al. Journal of Clinical Medicine. 2023;12(9):3287. doi:10.3390/jcm12093287. 3. Acquired Chiari I Malformation Secondary to Spontaneous Spinal Cerebrospinal Fluid Leakage and Chronic Intracranial Hypotension Syndrome in Seven Cases. Valma RUSH, Weinshenker BG, Cleotilde PUTTY, Piepgras DG, Mokri B. Journal of Neurosurgery. 1998;88(2):237-42. doi:10.3171/jns.1998.88.2.0237. 4. Spinal Cerebrospinal Fluid Leaks/Intracranial Hypotension. Amoozegar F. [facility] [place]. 2025;36(2):299-309. doi:10.1016/j.nec.2024.11.004. 5. Surgical Treatment of Spontaneous Intracranial Hypotension From Spinal Cerebrospinal Fluid Leak: Single Institution Case Series. Beverley SAUNDERS, Pansy DELENA Thaddeus DELENA, et  al. Neurosurgery. 2025;:[id number]. doi:10.1227/neu.[id number]. 6. Indications for Surgery and Surgical Options in Chiari Malformation: WFNS Spine Committee Recommendations. Visocchi M, Signorelli F, Alves L, et al. Spine. 2025;50(11):760-766. doi:10.1097/BRS.0000000000005288. 7. Chiari Malformation: Diagnosis, Classifications, Natural History, and Conservative Management. World Marine scientist. [place] F, Ait Benali S, Dantas F, et al. Spine. 2025;50(11):767-778. doi:10.1097/BRS.0000000000005289.    AMB Referral [facility] Care Management       Comments: polypharmacy    Ambulatory referral to Neurosurgery       Comments: Please Select To Department: [facility] for Nerve or Spine  Please select To Department: [facility] AT [place] for Cranial or Neurovascular  ### Urgent  Neurosurgery Referral     This patient presents with a severe, persistent orthostatic headache that is refractory to conservative management and migraine-directed therapies. She has a known history of Chiari malformation and her symptoms began acutely with postural change, remaining unremitting for several weeks. The clinical picture is highly concerning for spontaneous intracranial hypotension (SIH) secondary to spinal cerebrospinal fluid (CSF) leak, which can mimic or coexist with Chiari I malformation and is frequently underdiagnosed.[1][2][3]      SIH and Chiari I malformation share overlapping MRI features, including low-lying cerebellar tonsils and brainstem sagging, making differentiation critical to avoid misdiagnosis and inappropriate treatment pathways.[1][2][3] The urgency is underscored by the risk of ongoing neurological morbidity, as well as the potential for rapid symptom resolution with targeted intervention such as epidural blood patching or, in refractory cases, surgical repair.[4][5]      Current consensus from the [employer] Soci[employer] Spine Committee and recent systematic reviews recommend expedited neurosurgical evaluation for patients with symptomatic Chiari malformation or progressive symptoms, especially when SIH is suspected and conservative measures have failed.[6][7] Early diagnosis and intervention are associated with improved outcomes and may prevent unnecessary surgical decompression for Chiari malformation when the true etiology is a CSF leak.[1][2][3]      Given the severity, chronicity, and disabling nature of her symptoms, as well as the diagnostic complexity, an urgent neurosurgical assessment is warranted to guide further imaging, leak localization, and definitive management. Please prioritize this referral for expedited evaluation and intervention.      ### References  1. Cerebrospinal Fluid Leaks, Spontaneous Intracranial Hypotension, and  Chiari I Malformation. Von SAUNDERS, Cutsforth-Gregory JK, Brinjikji W. [facility] [place]. 2023;34(1):185-192. doi:10.1016/j.nec.2022.08.012. 2. Overview of Spontaneous Intracranial Hypotension and Differential Diagnosis With Chiari I Malformation. Bin Wan Hassan WMN, Mistretta F, Molinaro S, et al. Journal of Clinical Medicine. 2023;12(9):3287. doi:10.3390/jcm12093287. 3. Acquired Chiari I Malformation Secondary to Spontaneous Spinal Cerebrospinal Fluid Leakage and Chronic Intracranial Hypotension Syndrome in Seven Cases. Valma RUSH, Weinshenker BG, Cleotilde PUTTY, Piepgras DG, Mokri B. Journal of Neurosurgery. 1998;88(2):237-42. doi:10.3171/jns.1998.88.2.0237. 4. Spinal Cerebrospinal Fluid Leaks/Intracranial Hypotension. Amoozegar F. [facility] [place]. 2025;36(2):299-309. doi:10.1016/j.nec.2024.11.004. 5. Surgical Treatment of Spontaneous Intracranial Hypotension From Spinal Cerebrospinal Fluid Leak: Single Institution Case Series. Beverley SAUNDERS, Pansy DELENA Thaddeus DELENA, et al. Neurosurgery. 2025;:[id number]. doi:10.1227/neu.9999999999996456. 6. Indications for Surgery and Surgical Options in Chiari Malformation: WFNS Spine Committee Recommendations. Visocchi M, Signorelli F, Alves L, et al. Spine. 2025;50(11):760-766. doi:10.1097/BRS.0000000000005288. 7. Chiari Malformation: Diagnosis, Classifications, Natural History, and Conservative Management. World Marine scientist. [place] F, Ait Benali S, Dantas F, et al. Spine. 2025;50(11):767-778. doi:10.1097/BRS.0000000000005289.     On the day of the visit, I dedicated 37 minutes to both direct and indirect patient care activities.  The time was spent: Preparation: I reviewed the patient's records before and  during the visit to support individualized clinical decision making. History: I obtained, documented, and reviewed a thorough medical history. I reviewed the patient's  reported symptoms and clarified their context and significance in relation to the current visit. Examination: I conducted a medically appropriate physical evaluation. Data Synthesis: I synthesized information for clinical decision-making. Communication: I communicated clinical status and plan to the patient and/or family/caregiver. Counseling & Education: I provided personalized counseling on condition and treatment. Documentation: Documenting clinical findings and medical decision-making, and creating and providing documentation for patient review. Treatment Plan: I worked collaboratively with the patient to formulate and communicate an individualized plan (including shared decision-making). Interpretation of Results: I independently interpreted the results of tests and procedures, which are not separately reported Orders: I placed necessary orders (medications, labs, imaging, referrals) in the EMR. Referrals and Communication: I referred the patient to other health care professionals as needed and communicated with them to ensure coordinated care.  This time was spent independently of any separately billable procedures. Please note that this statement is intended to provide a clear and comprehensive account of the time and services provided during the patient's visit.  The extended time spent was necessary to provide safe, effective, and comprehensive care due to the following factors:, Data Analysis & Complex Decision-Making: I performed in-depth data review and complex treatment planning tailored to the patient's unique clinical profile., Patient Requested Extended Education & Communication: At the patient's request, I spent additional time educating the patient and/or family members (e.g., spouse, child) to ensure understanding of complex health information, answer questions, and support shared decision-making-time that exceeded a typical visit duration and was not separately billable., Patient  Requested In-Depth Education on Disease Management: At the patient's request, I provided additional time to thoroughly educate them on self management of their chronic condition, review lifestyle modifications, and clarify follow up plans-time that exceeded typical encounter duration and was not separately billable., and Complex Medication Education Requested by Patient: The patient requested more detailed teaching about their new medication regimen-including dosing, side effects, monitoring-which required extended education time not reflected in standard medication counseling.     This document was synthesized by artificial intelligence (Abridge) using HIPAA-compliant recording of the clinical interaction;   We discussed the use of AI scribe software for clinical note transcription with the patient, who gave verbal consent to proceed. additional Info: This encounter employed state-of-the-art, real-time, collaborative documentation. The patient actively reviewed and assisted in updating their electronic medical record on a shared screen, ensuring transparency and facilitating joint problem-solving for the problem list, overview, and plan. This approach promotes accurate, informed care. The treatment plan was discussed and reviewed in detail, including medication safety, potential side effects, and all patient questions. We confirmed understanding and comfort with the plan. Follow-up instructions were established, including contacting the office for any concerns, returning if symptoms worsen, persist, or new symptoms develop, and precautions for potential emergency department visits.

## 2024-08-18 ENCOUNTER — Telehealth: Payer: Self-pay

## 2024-08-18 NOTE — Telephone Encounter (Signed)
 no auth required sent to GI per patient request, she didn't want to schedule at [place].

## 2024-08-18 NOTE — Progress Notes (Signed)
 Care Guide Pharmacy Note  08/18/2024 Name: Diane [name] MRN: [id number] DOB: 07/23/1981  Referred By: Jesus Bernardino MATSU, MD Reason for referral: Complex Care Management (Outreach to schedule with Pharm d )   Diane [name] is a 43 y.o. year old female who is a primary care patient of Jesus Bernardino MATSU, MD.  Diane [name] was referred to the pharmacist for assistance related to: polypharmacy   Successful contact was made with the patient to discuss pharmacy services including being ready for the pharmacist to call at least 5 minutes before the scheduled appointment time and to have medication bottles and any blood pressure readings ready for review. The patient agreed to meet with the pharmacist via telephone visit on (date/time).  Jeoffrey Buffalo , RMA     [facility]  [facility], [facility] Guide  Direct Dial: [phone]  Website: [email or web address]

## 2024-08-22 ENCOUNTER — Ambulatory Visit
Admission: RE | Admit: 2024-08-22 | Discharge: 2024-08-22 | Disposition: A | Discharge status: Home / Self Care | Source: Ambulatory Visit | Attending: Neurology | Admitting: Neurology

## 2024-08-22 DIAGNOSIS — G935 Compression of brain: Secondary | ICD-10-CM | POA: Diagnosis not present

## 2024-08-22 DIAGNOSIS — R202 Paresthesia of skin: Secondary | ICD-10-CM | POA: Diagnosis not present

## 2024-08-22 DIAGNOSIS — G44209 Tension-type headache, unspecified, not intractable: Secondary | ICD-10-CM

## 2024-08-23 ENCOUNTER — Ambulatory Visit (INDEPENDENT_AMBULATORY_CARE_PROVIDER_SITE_OTHER): Admitting: Neurosurgery

## 2024-08-23 ENCOUNTER — Ambulatory Visit: Payer: Self-pay | Admitting: Neurology

## 2024-08-23 VITALS — BP 131/83 | HR 91

## 2024-08-23 DIAGNOSIS — G932 Benign intracranial hypertension: Secondary | ICD-10-CM | POA: Diagnosis not present

## 2024-08-23 NOTE — Progress Notes (Unsigned)
 Assessment : 43 year old lady with a past medical history significant for headaches as a child.  These headaches have persisted over her childhood, adolescence and adult life.  At the age of 86, she was seen at [facility] where the diagnosis of pseudotumor cerebri was made.  At that time she had a catheter placed in her lower back and spinal fluid was taken off but according to her, the nurse left it open and everything drained which resulted in prolonged headaches and need for rehab even.  She did not follow-up with anybody since then.  She has been battling headaches and her migraines have been appropriately treated with Emgality .  A few weeks ago, she woke up in the morning and had severe headaches which have persisted ever since.  These are frontal and she has pulsatile tinnitus in her right ear.  She also has severe vertigo with this.  The headaches are not positional and they are the worst in the morning and as the day goes along and get better.  She was evaluated and had an MRI of the brain to rule out a Chiari malformation given the fact that she was having pain in the back of her head and fortunately this did not show a Chiari.  She does not seem to have an empty sella either.  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  Plan : As I listen to her story, with the past medical history positive for the diagnosis of pseudotumor cerebri, it is most likely that she has that.  I do not suspect her of having low pressure headaches as the MRI does not indicate any findings suggestive of that and secondly, her symptoms are worse in the morning when she wakes up.  She does have a mild case of sleep apnea but says that she does not need a machine though in the future this may need to be revisited.  I shared with her that I would like to get her optometrist/ophthalmologist to get an eye exam and do an fundoscopy to evaluate her for papilledema.  She says that she  does not have any blurry vision but she cannot drive at night because of the oncoming lights are too bright.  Secondly, I would like to get an MRV to look at her veins and lastly, I will see her back in a couple weeks.  Her MRI of the brain that was previously done has a thin slice sequence already and I do not believe that we need another MRI of the brain.  Lastly, I would like to do a lumbar puncture and went over this with her.  This would be to check pressures and CSF would be taken off to see if this improves her headaches.  I will see her back in a few weeks and she will bring the eye exam report with her.   Social History   Socioeconomic History   Marital status: Married    Spouse name: Not on file   Number of children: 2   Years of education: Not on file   Highest education level: Not on file  Occupational History   Occupation: Investment banker, 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  Tobacco Use   Smoking status: Former    Current packs/day: 0.50    Average packs/day: 0.5 packs/day for 15.0 years (7.5 ttl pk-yrs)    Types: Cigarettes   Smokeless tobacco: Never  Vaping Use  Vaping status: Every Day   Substances: Nicotine   Substance and Sexual Activity   Alcohol use: Yes    Comment: occasional   Drug use: No   Sexual activity: Yes    Birth control/protection: Surgical    Comment: partial hysterectomy  Other Topics Concern   Not on file  Social History Narrative   Not on file   Social Drivers of Health   Financial Resource Strain: Not on file  Food Insecurity: Not on file  Transportation Needs: Not on file  Physical Activity: Not on file  Stress: Not on file  Social Connections: Unknown (03/26/2022)   Received [facility]   Social Network    Social Network: Not on file  Intimate Partner Violence: Unknown (02/19/2022)   Received from [facility]   HITS    Physically Hurt: Not on file    Insult or Talk Down To: Not on file    Threaten Physical  Harm: Not on file    Scream or Curse: Not on file    Family History  Problem Relation Age of Onset   Learning disabilities Mother    Asthma Mother    Arthritis Mother    Irritable bowel syndrome Mother    Miscarriages / [place] Mother    Hypertension Father    Early death Father    Drug abuse Father    Depression Father    Cancer Father    Arthritis Father    Alcohol abuse Father    Brain cancer Father    Anesthesia problems Father        hard to wake up post-op   Lung cancer Father    Hyperlipidemia Maternal Grandmother    Hypertension Maternal Grandmother    Cancer Maternal Grandmother    Asthma Maternal Grandmother    Arthritis Maternal Grandmother    Breast cancer Maternal Grandmother    Ovarian cancer Maternal Grandmother    Diabetes Maternal Grandmother    Heart disease Maternal Grandmother    Heart attack Maternal Grandmother    Hypertension Maternal Grandfather    Hyperlipidemia Maternal Grandfather    Arthritis Maternal Grandfather    Alcohol abuse Maternal Grandfather    Cancer Maternal Grandfather    Prostate cancer Maternal Grandfather    Melanoma Maternal Grandfather    Cancer - Prostate Maternal Grandfather    Hypertension Paternal Grandmother    COPD Paternal Grandmother    Arthritis Paternal Grandmother    Learning disabilities Daughter    Learning disabilities Son    Colon cancer Neg Hx     Allergies  Allergen Reactions   Droperidol  Other (See Comments)    Jittery and restlessness   Metoclopramide  Other (See Comments)    Reglan    Nickel Rash    any jewelry that's not real/ rash and itching    Prochlorperazine Other (See Comments)   Nsaids Other (See Comments)    DUE TO HISTORY OF GASTRIC ULCER   Tolmetin Itching and Other (See Comments)    DUE TO HISTORY OF GASTRIC ULCER  DUE TO HISTORY OF GASTRIC ULCER  DUE TO HISTORY OF GASTRIC ULCER, DUE TO HISTORY OF GASTRIC ULCER   Oxybutynin  Other (See Comments)    severe dry mouth    Adhesive [Tape] Rash   Metoclopramide  Hcl Other (See Comments)    RESTLESSNESS/JITTERY   Prochlorperazine Edisylate Other (See Comments)    RESTLESSNESS/JITTERY    Sulfa Antibiotics Rash    Past Medical History:  Diagnosis Date   Acute injury of  left knee cartilage 05/24/2024   ADD (attention deficit disorder with hyperactivity)    Allergy     Anxiety    Arthritis    Atypical chest pain 07/26/2021   Atypical chest pain     Benign intracranial hypertension 06/16/2008   Qualifier: Diagnosis of   By: Zimonjic, Milica         Choking    due to food and pills get stuck   Cholelithiasis and acute cholecystitis without obstruction 12/02/2016   Chronic cough 12/27/2013   Depression    Dry mouth 08/12/2017   Dysmenorrhea    Family history of adverse reaction to anesthesia    pt's father has hx. of being hard to wake up post-op   Gastric ulcer 03/16/2015   GERD (gastroesophageal reflux disease)    Headache 07/09/2007   Qualifier: Diagnosis of   By: Tammie MD, Bruce      Replacing diagnoses that were inactivated after the 02/16/23 regulatory import     History of gastric ulcer    History of kidney stones    Hyperlipidemia    IBS (irritable bowel syndrome)    Lower extremity edema 07/26/2021   Lower extremity edema     Migraines    neurologist-  dr c. hagen ([facility] in [place])-- treated with Emgality  injection every 30 days   Mild obstructive sleep apnea    per study in epic 03-21-2017 mild osa, recommendation cpap, mouth appliance, wt loss   Nausea and vomiting 12/03/2016   OAB (overactive bladder) 08/12/2017   Osteoarthritis    right ankle   Osteochondral lesion 09/2016   right ankle   Osteomyelitis of knee region [facility])    Peripheral vascular disease    NERVE DAMAGE RIGHT LEG AND FOOT DUE TO INJURGY.    Plantar fasciitis of left foot    Pleural effusion 12/15/2013   PONV (postoperative nausea and vomiting)    AND HEADACHES   Pseudotumor cerebri    at  back head   RUQ pain    S/P dilatation of esophageal stricture 07/2018   Sinusitis 08/12/2018   RESOLVED WITH ANTIOBIOTIC   SUI (stress urinary incontinence, female)    Wears contact lenses    Wears partial dentures    upper    Past Surgical History:  Procedure Laterality Date   ANKLE ARTHROSCOPY Right 10/22/2016   Procedure: ANKLE ARTHROSCOPY;  Surgeon: Donnice JONELLE Fees, DPM;  Location: [facility];  Service: Podiatry;  Laterality: Right;  GENERAL/REG BLOCK   ANKLE ARTHROSCOPY Right 09-15-2017   @[facility]    BLADDER SURGERY  child   x 2 - as a child to stretch bladder   CESAREAN SECTION  12/24/2009   CESAREAN SECTION  11/20/2011   Procedure: CESAREAN SECTION;  Surgeon: Charlie JINNY Flowers, MD;  Location: [facility];  Service: Gynecology;  Laterality: N/A;   CHOLECYSTECTOMY N/A 12/03/2016   Procedure: LAPAROSCOPIC CHOLECYSTECTOMY WITH INTRAOPERATIVE CHOLANGIOGRAM;  Surgeon: Krystal Spinner, MD;  Location: [facility];  Service: General;  Laterality: N/A;   CHONDROPLASTY Right 01/12/2024   Procedure: RIGHT KNEE ARTHROSCOPY LATERAL FEMORAL CHONDROPLASTY;  Surgeon: Genelle Standing, MD;  Location: [facility];  Service: Orthopedics;  Laterality: Right;   CYSTOSCOPY WITH RETROGRADE PYELOGRAM, URETEROSCOPY AND STENT PLACEMENT Left 08/10/2006   ESOPHAGOGASTRODUODENOSCOPY (EGD) WITH ESOPHAGEAL DILATION  07/2018   HARDWARE REMOVAL Right 07/01/2016   Procedure: HARDWARE REMOVAL;  Surgeon: Glendia Cordella Hutchinson, MD;  Location: [facility];  Service: Orthopedics;  Laterality: Right;   HARVEST BONE GRAFT Right  01/12/2024   Procedure: RIGHT BONE MARROW ASPIRATE FROM ILIAC CREST DECOMPRESSION AND PLACEMENT RIGHT LATERAL FEMORAL CONDYLE;  Surgeon: Genelle Standing, MD;  Location: [facility];  Service: Orthopedics;  Laterality: Right;   IMPLANTATION, MATRIX-INDUCED AUTOLOGOUS CHONDROCYTES Left 05/24/2024   Procedure: IMPLANTATION, MATRIX-INDUCED AUTOLOGOUS CHONDROCYTES;  Surgeon: Genelle Standing,  MD;  Location: [facility];  Service: Orthopedics;  Laterality: Left;  LEFT KNEE MATRIX ASSOCIATED CHONDROCYTE IMPLANTATION   KNEE ARTHROSCOPY Left 05/24/2024   Procedure: ARTHROSCOPY, KNEE;  Surgeon: Genelle Standing, MD;  Location: [facility];  Service: Orthopedics;  Laterality: Left;   KNEE ARTHROSCOPY WITH LATERAL RELEASE Right 12/13/2014   Procedure: KNEE ARTHROSCOPY WITH LATERAL RELEASE;  Surgeon: LELON JONETTA Shari Mickey., MD;  Location: [facility];  Service: Orthopedics;  Laterality: Right;   LAPAROSCOPIC APPENDECTOMY N/A 06/11/2014   Procedure: APPENDECTOMY LAPAROSCOPIC;  Surgeon: Krystal CHRISTELLA Spinner, MD;  Location: [facility];  Service: General;  Laterality: N/A;   ORIF ANKLE FRACTURE Right 01/05/2016   Procedure: OPEN REDUCTION INTERNAL FIXATION (ORIF) ANKLE FRACTURE;  Surgeon: Glendia Cordella Hutchinson, MD;  Location: [facility];  Service: Orthopedics;  Laterality: Right;   OVARIAN CYST SURGERY Right 03/05/2007   AND CHROMOPERTUBATION  VIA LAPAROSCOPY   ROBOTIC ASSISTED LAPAROSCOPIC HYSTERECTOMY AND SALPINGECTOMY Bilateral 08/27/2018   Procedure: XI ROBOTIC ASSISTED LAPAROSCOPIC HYSTERECTOMY AND BILATERAL SALPINGECTOMY;  Surgeon: Gorge Ade, MD;  Location: [facility];  Service: Gynecology;  Laterality: Bilateral;  [facility] for 23hr OBS   SYNOVECTOMY Right 12/13/2014   Procedure: PLICA SYNOVECTOMY;  Surgeon: LELON JONETTA Shari Mickey., MD;  Location: [facility];  Service: Orthopedics;  Laterality: Right;   TONSILLECTOMY AND ADENOIDECTOMY  child   WISDOM TOOTH EXTRACTION       Physical Exam HENT:     Head: Normocephalic.     Nose: Nose normal.  Eyes:     Pupils: Pupils are equal, round, and reactive to light.  Cardiovascular:     Rate and Rhythm: Normal rate.  Pulmonary:     Effort: Pulmonary effort is normal.  Abdominal:     General: Abdomen is flat.  Musculoskeletal:     Cervical back: Normal range of motion.  Neurological:     Mental Status: She is alert.      Cranial Nerves: Cranial nerves 2-12 are intact.     Sensory: Sensation is intact.     Motor: Motor function is intact.     Coordination: Coordination is intact.        Results for orders placed or performed during the hospital encounter of 08/22/24  MR BRAIN WO CONTRAST   Narrative     [facility] [address] [place], [place] [address] [phone]  NEUROIMAGING REPORT   STUDY DATE: 08/22/2024 PATIENT NAME: Diane [name] DOB: May 19, 1981 MRN: [id number]  EXAM: MRI Brain without contrast  ORDERING CLINICIAN: Pastor Falling, MD CLINICAL HISTORY: 43 year old woman with headaches, paresthesias and  Chiari malformation COMPARISON FILMS: CT scan of the head 07/29/2024  TECHNIQUE: MRI of the brain without contrast was obtained utilizing 5 mm  axial slices with T1, T2, T2 flair, SWI and diffusion weighted views.  T1  sagittal and T2 coronal views were obtained. CONTRAST: none IMAGING SITE: [facility], [address] [place], [place]  FINDINGS: On sagittal images, the spinal cord is imaged caudally to C3 and  is normal in caliber.   The cerebellar tonsils extend to the level of the  foramen magnum but not below.  This  is normal.  The pituitary gland and  optic chiasm appear normal.    Brain volume appears normal.   The  ventricles are normal in size and without distortion.  There are no  abnormal extra-axial collections of fluid.    The cerebellum and brainstem appears normal.   The deep gray matter  appears normal.  The cerebral hemispheres appear normal.  Diffusion  weighted images are normal.  Susceptibility weighted images are normal.     The orbits appear normal.   The VIIth/VIIIth nerve complex appears normal.   The mastoid air cells appear normal.  The paranasal sinuses appear  normal.  Flow voids are identified within the major intracerebral  arteries.        Impression   This is a normal noncontrasted MRI of the brain.  There is  no  evidence of a Chiari malformation.    INTERPRETING PHYSICIAN:  Richard A. Vear, MD, PhD, FAAN Certified in  Neuroimaging by [employer]       Results for orders placed or performed during the hospital encounter of 11/21/23  CT Head Wo Contrast   Narrative   CLINICAL DATA:  Head trauma, GCS=15, no focal neuro findings (low risk). Fall with loss of consciousness.  EXAM: CT HEAD WITHOUT CONTRAST  TECHNIQUE: Contiguous axial images were obtained from the base of the skull through the vertex without intravenous contrast.  RADIATION DOSE REDUCTION: This exam was performed according to the departmental dose-optimization program which includes automated exposure control, adjustment of the mA and/or kV according to patient size and/or use of iterative reconstruction technique.  COMPARISON:  Head MRI 04/13/2008  FINDINGS: Brain: There is no evidence of an acute infarct, intracranial hemorrhage, mass, midline shift, or extra-axial fluid collection. The ventricles and sulci are normal.  Vascular: No hyperdense vessel.  Skull: No acute fracture or suspicious osseous lesion.  Sinuses/Orbits: Visualized paranasal sinuses and mastoid air cells are clear. Unremarkable orbits.  Other: None.  IMPRESSION: Negative head CT.   Electronically Signed   By: Dasie Hamburg M.D.   On: 11/21/2023 14:47   Results for orders placed or performed during the hospital encounter of 04/13/08  MR Brain W Wo Contrast   Narrative   Clinical Data: Bilateral objective pulsatile tinnitus.  Severe headaches.   MRI HEAD WITHOUT AND WITH CONTRAST   Technique:  Multiplanar, multiecho pulse sequences of the brain and surrounding structures were obtained according to standard protocol without and with intravenous contrast   Contrast: 15 ml Multihance   Comparison: Sinus CT 06/19/2004   Findings: The brain itself has a normal appearance without evidence of atrophy,  infarction, mass lesion, hemorrhage, hydrocephalus or extra-axial collection.  No abnormal enhancement occurs.  The cerebellar tonsils are at the level of the foramen magnum, but do not protrude through it.  Therefore, there is not Chiari malformation.  There is no imaging sign of intracranial hypertension or hypotension.  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   Temporal bone regions appear normal.  No abnormal arterial or venous structures are seen.  Paranasal sinuses, middle ears and mastoids are clear.  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   IMPRESSION: Negative examination.  See above discussion.  Provider: Olam Fillers

## 2024-08-24 ENCOUNTER — Other Ambulatory Visit

## 2024-08-25 ENCOUNTER — Encounter: Payer: Self-pay | Admitting: Internal Medicine

## 2024-08-25 ENCOUNTER — Ambulatory Visit
Admission: RE | Admit: 2024-08-25 | Discharge: 2024-08-25 | Disposition: A | Discharge status: Home / Self Care | Source: Ambulatory Visit | Attending: Neurosurgery | Admitting: Neurosurgery

## 2024-08-25 ENCOUNTER — Ambulatory Visit: Admitting: Internal Medicine

## 2024-08-25 VITALS — BP 126/76 | HR 88 | Temp 98.0°F | Ht 66.0 in | Wt 254.8 lb

## 2024-08-25 DIAGNOSIS — G912 (Idiopathic) normal pressure hydrocephalus: Secondary | ICD-10-CM

## 2024-08-25 DIAGNOSIS — G932 Benign intracranial hypertension: Secondary | ICD-10-CM | POA: Diagnosis not present

## 2024-08-25 DIAGNOSIS — Z79899 Other long term (current) drug therapy: Secondary | ICD-10-CM

## 2024-08-25 DIAGNOSIS — G4453 Primary thunderclap headache: Secondary | ICD-10-CM

## 2024-08-25 DIAGNOSIS — G4733 Obstructive sleep apnea (adult) (pediatric): Secondary | ICD-10-CM

## 2024-08-25 MED ORDER — TIRZEPATIDE-WEIGHT MANAGEMENT 2.5 MG/0.5ML ~~LOC~~ SOAJ: 2.5000 mg | SUBCUTANEOUS | 11 refills | Status: DC

## 2024-08-25 MED ORDER — KETOROLAC TROMETHAMINE 60 MG/2ML IM SOLN
60.0000 mg | Freq: Once | INTRAMUSCULAR | Status: AC
  Administered 2024-08-25: 60 mg via INTRAMUSCULAR

## 2024-08-25 MED ORDER — ERENUMAB-AOOE 140 MG/ML ~~LOC~~ SOAJ: 140.0000 mg | Freq: Once | SUBCUTANEOUS | Status: AC

## 2024-08-25 MED ORDER — OXYCODONE-ACETAMINOPHEN 10-325 MG PO TABS: 1.0000 | ORAL_TABLET | Freq: Three times a day (TID) | ORAL | 0 refills | Status: AC | PRN

## 2024-08-25 MED ORDER — GADOPICLENOL 0.5 MMOL/ML IV SOLN: 10.0000 mL | Freq: Once | INTRAVENOUS | Status: DC | PRN

## 2024-08-25 NOTE — Assessment & Plan Note (Signed)
 Idiopathic intracranial hypertension (pseudotumor cerebri)   Idiopathic intracranial hypertension is suspected due to symptoms and history of pseudotumor cerebri, with Chiari I malformation ruled out. High intracranial pressure is likely related to weight gain. Neurology and neurosurgery following with - neurosurgery Ordered an MR venogram to evaluate for sinus thrombosis and Refer to an ophthalmologist to assess for papilledema. And Schedule a lumbar puncture to measure intracranial pressure, considering her history of trauma from a previous procedure. Discuss potential GLP-1 agonist therapy for weight management to reduce intracranial pressure.  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 Current management focuses on idiopathic intracranial hypertension as a potential cause. Chronic migraine without aura remains a concern.  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

## 2024-08-25 NOTE — Patient Instructions (Signed)
 It was a pleasure seeing you today! Your health and satisfaction are our top priorities.  Bernardino Cone, MD  VISIT SUMMARY: During your visit, we discussed your persistent headaches and dizziness, which are likely related to your history of pseudotumor cerebri. We reviewed your past experiences and concerns, including your traumatic experience with a previous lumbar puncture and your difficulties with weight loss. We also discussed the potential for vision loss if your condition is not managed effectively.  YOUR PLAN: -IDIOPATHIC INTRACRANIAL HYPERTENSION (PSEUDOTUMOR CEREBRI): Idiopathic intracranial hypertension is a condition where there is increased pressure around your brain, often related to weight gain. We will order an MR venogram to check for sinus thrombosis and refer you to an ophthalmologist to assess for any swelling in your eyes. We will also schedule a lumbar puncture to measure the pressure around your brain, keeping in mind your previous traumatic experience. Additionally, we will discuss the potential use of GLP-1 agonist therapy to help with weight management and reduce intracranial pressure.  -THUNDERCLAP HEADACHE AND CHRONIC MIGRAINE WITHOUT AURA: A thunderclap headache is a severe headache that comes on suddenly, and it was previously evaluated for a potential ruptured aneurysm. We will continue to manage your condition with a focus on idiopathic intracranial hypertension as a potential cause. Chronic migraine without aura, which is a type of headache that occurs frequently without warning signs, remains a concern.  -OBESITY: Obesity is contributing to your idiopathic intracranial hypertension. We recommend weight loss to help alleviate your symptoms. We will pursue insurance coverage for GLP-1 agonist therapy, and if insurance denies coverage, we will consider alternative medication procurement through a compounding pharmacy.  INSTRUCTIONS: Please follow up with the MR venogram as  scheduled to check for sinus thrombosis. We will also arrange an appointment with an ophthalmologist to assess for papilledema. Additionally, we will schedule a lumbar puncture to measure your intracranial pressure. Please continue to discuss GLP-1 agonist therapy with your primary care provider and explore options for insurance coverage or alternative procurement methods.  Your Providers PCP: Cone Bernardino MATSU, MD,  [phone]) Referring Provider: Cone Bernardino MATSU, MD,  [phone]) Care Team Provider: Gorge Ade, MD,  [phone]) Care Team Provider: Domenica Reusing, MD,  [phone])  NEXT STEPS: [x]  Early Intervention: Schedule sooner appointment, call our on-call services, or go to emergency room if there is any significant Increase in pain or discomfort New or worsening symptoms Sudden or severe changes in your health [x]  Flexible Follow-Up: We recommend a No follow-ups on file. for optimal routine care. This allows for progress monitoring and treatment adjustments. [x]  Preventive Care: Schedule your annual preventive care visit! It's typically covered by insurance and helps identify potential health issues early. [x]  Lab & X-ray Appointments: Incomplete tests scheduled today, or call to schedule. X-rays: [facility] (M-F, 8:30am-noon or 1pm-5pm). [x]  Medical Information Release: Sign a release form at front desk to obtain relevant medical information we don't have.  MAKING THE MOST OF OUR FOCUSED 20 MINUTE APPOINTMENTS: [x]   Clearly state your top concerns at the beginning of the visit to focus our discussion [x]   If you anticipate you will need more time, please inform the front desk during scheduling - we can book multiple appointments in the same week. [x]   If you have transportation problems- use our convenient video appointments or ask about transportation support. [x]   We can get down to business faster if you use MyChart to update information before  the visit and submit non-urgent questions before your  visit. Thank you for taking the time to provide details through MyChart.  Let our nurse know and she can import this information into your encounter documents.  Arrival and Wait Times: [x]   Arriving on time ensures that everyone receives prompt attention. [x]   Early morning (8a) and afternoon (1p) appointments tend to have shortest wait times. [x]   Unfortunately, we cannot delay appointments for late arrivals or hold slots during phone calls.  Getting Answers and Following Up [x]   Simple Questions & Concerns: For quick questions or basic follow-up after your visit, reach us  at [phone] or MyChart messaging. [x]   Complex Concerns: If your concern is more complex, scheduling an appointment might be best. Discuss this with the staff to find the most suitable option. [x]   Lab & Imaging Results: We'll contact you directly if results are abnormal or you don't use MyChart. Most normal results will be on MyChart within 2-3 business days, with a review message from Dr. Jesus. Haven't heard back in 2 weeks? Need results sooner? Contact us  at [phone]. [x]   Referrals: Our referral coordinator will manage specialist referrals. The specialist's office should contact you within 2 weeks to schedule an appointment. Call us  if you haven't heard from them after 2 weeks.  Staying Connected [x]   MyChart: Activate your MyChart for the fastest way to access results and message us . See the last page of this paperwork for instructions on how to activate.  Bring to Your Next Appointment [x]   Medications: Please bring all your medication bottles to your next appointment to ensure we have an accurate record of your prescriptions. [x]   Health Diaries: If you're monitoring any health conditions at home, keeping a diary of your readings can be very helpful for discussions at your next appointment.  Billing [x]   X-ray & Lab Orders: These are billed by  separate companies. Contact the invoicing company directly for questions or concerns. [x]   Visit Charges: Discuss any billing inquiries with our administrative services team.  Your Satisfaction Matters [x]   Share Your Experience: We strive for your satisfaction! If you have any complaints, or preferably compliments, please let Dr. Jesus know directly or contact our Practice Administrators, Manuelita Rubin or [employer], by asking at the front desk.   Reviewing Your Records [x]   Review this early draft of your clinical encounter notes below and the final encounter summary tomorrow on MyChart after its been completed.  All orders placed so far are visible here: Thunderclap headache -     Ketorolac  Tromethamine  -     oxyCODONE -Acetaminophen ; Take 1 tablet by mouth every 8 (eight) hours as needed for up to 5 days for pain.  Dispense: 11 tablet; Refill: 0 -     Erenumab-aooe  Pseudotumor cerebri  NPH (normal pressure hydrocephalus) (HCC)  Morbid obesity (HCC)  OSA (obstructive sleep apnea)

## 2024-08-25 NOTE — Progress Notes (Signed)
 ==============================  [facility]: [phone]   -- Medical Office Visit --  Patient: Diane [name]      Age: 43 y.o.       Sex:  female  Date:   08/25/2024 Today's Healthcare Provider: Bernardino KANDICE Cone, MD  ==============================   Chief Complaint: Hypertension Specifically, was advised by neurosurgery and has remote history intracranial hypertension / pseudotumore cerebri  Discussed the use of AI scribe software for clinical note transcription with the patient, who gave verbal consent to proceed.  History of Present Illness 43 year old female with pseudotumor cerebri who presents with persistent headaches and dizziness.  She has a history of pseudotumor cerebri diagnosed 20 years ago and is currently experiencing persistent headaches and dizziness. She recalls a thunderclap headache that was initially suspected to be due to a ruptured aneurysm, a condition that runs in her family. Further evaluation ruled out Chiari malformation. She has undergone an MRI and is scheduled for a magnetic resonance venogram (MRV) to check for sinus thrombosis.  She recounts a traumatic experience from 20 years ago when she was hospitalized for pseudotumor cerebri, which led to an addiction to pain medication after being sent home with Dilaudid . During that hospitalization, a spinal drain was left open, causing significant distress and leading her to leave the hospital prematurely. This experience has left her apprehensive about undergoing another lumbar puncture.  She has been experiencing difficulty losing weight, which she believes is contributing to her current symptoms. She has discussed the use of GLP-1 medications with her primary care provider, but insurance coverage has been a barrier. She is concerned about the potential for vision loss if her condition is not managed effectively.  During the review of symptoms, she was asked about swelling  around her eyes, which she was unsure about. She does not currently have a cerebrospinal fluid drain in place.  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), Choking, Cholelithiasis and acute cholecystitis without obstruction (12/02/2016), Chronic cough (12/27/2013), Depression, Dry mouth (08/12/2017), Dysmenorrhea, Family history of adverse reaction to anesthesia, Gastric ulcer (03/16/2015), GERD (gastroesophageal reflux disease), Headache (07/09/2007), History of gastric ulcer, History of kidney stones, Hyperlipidemia, IBS (irritable bowel syndrome), Lower extremity edema (07/26/2021), Migraines, Mild obstructive sleep apnea, Nausea and vomiting (12/03/2016), OAB (overactive bladder) (08/12/2017), Osteoarthritis, Osteochondral lesion (09/2016), Osteomyelitis of knee region [facility]), Peripheral vascular disease, Plantar fasciitis of left foot, Pleural effusion (12/15/2013), PONV (postoperative nausea and vomiting),  Pseudotumor cerebri, RUQ pain, S/P dilatation of esophageal stricture (07/2018), Sinusitis (08/12/2018), SUI (stress urinary incontinence, female), Wears contact lenses, and Wears partial dentures. Past Surgical History:   has a past surgical history that includes Cesarean section (12/24/2009); Cesarean section (11/20/2011); laparoscopic appendectomy (N/A, 06/11/2014); Bladder  surgery (child); Wisdom tooth extraction; Ovarian cyst surgery (Right, 03/05/2007); Cystoscopy with retrograde pyelogram, ureteroscopy and stent placement (Left, 08/10/2006); Synovectomy (Right, 12/13/2014); Knee arthroscopy with lateral release (Right, 12/13/2014); ORIF ankle fracture (Right, 01/05/2016); Hardware Removal (Right, 07/01/2016); Ankle arthroscopy (Right, 10/22/2016); Cholecystectomy (N/A, 12/03/2016); Ankle arthroscopy (Right, 09-15-2017   @[facility] ); Tonsillectomy and adenoidectomy (child); Esophagogastroduodenoscopy (egd) with esophageal dilation (07/2018); Robotic assisted laparoscopic hysterectomy and salpingectomy (Bilateral, 08/27/2018); Chondroplasty (Right, 01/12/2024); Harvest bone graft (Right, 01/12/2024); Implantation, matrix-induced autologous chondrocytes (Left, 05/24/2024); and Knee arthroscopy (Left, 05/24/2024). Social History:   reports that she has quit smoking. Her smoking use included cigarettes. She has a 7.5 pack-year smoking history. She has never used smokeless tobacco. She reports current alcohol use. She reports that she does not use drugs. Family History:  family history includes Alcohol abuse in her father and maternal grandfather; Anesthesia problems in her father; Arthritis in her father, maternal grandfather, maternal grandmother, mother, and paternal grandmother; Asthma in her maternal grandmother and mother; Brain cancer in her father; Breast cancer in her maternal grandmother; COPD in her paternal grandmother; Cancer in her father, maternal grandfather, and maternal grandmother; Cancer - Prostate in her  maternal grandfather; Depression in her father; Diabetes in her maternal grandmother; Drug abuse in her father; Early death in her father; Heart attack in her maternal grandmother; Heart disease in her maternal grandmother; Hyperlipidemia in her maternal grandfather and maternal grandmother; Hypertension in her father, maternal grandfather, maternal grandmother, and paternal grandmother; Irritable bowel syndrome in her mother; Learning disabilities in her daughter, mother, and son; Lung cancer in her father; Melanoma in her maternal grandfather; Miscarriages / [place] in her mother; Ovarian cancer in her maternal grandmother; Prostate cancer in her maternal grandfather. Allergies:  is allergic to droperidol , metoclopramide , nickel, prochlorperazine, nsaids, tolmetin, oxybutynin , adhesive [tape], metoclopramide  hcl, prochlorperazine edisylate, and sulfa antibiotics.   Medication Reconciliation: Current Outpatient Medications on File Prior to Visit  Medication Sig   ADDERALL XR 20 MG 24 hr capsule Take 20 mg by mouth every morning.   albuterol  (PROVENTIL  HFA;VENTOLIN  HFA) 108 (90 Base) MCG/ACT inhaler Inhale 2 puffs into the lungs every 6 (six) hours as needed for wheezing or shortness of breath.    ALPRAZolam (XANAX) 0.25 MG tablet SMARTSIG:0.5 Tablet(s) By Mouth PRN   amoxicillin -clavulanate (AUGMENTIN ) 875-125 MG tablet Take 1 tablet by mouth 2 (two) times daily. (Patient not taking: Reported on 08/23/2024)   aspirin  EC 325 MG tablet Take 1 tablet (325 mg total) by mouth daily. (Patient not taking: Reported on 08/23/2024)   atorvastatin  (LIPITOR) 40 MG tablet TAKE 1 TABLET BY MOUTH EVERY DAY   Bacillus Coagulans-Inulin (PROBIOTIC) 1-250 BILLION-MG CAPS    cetirizine (ZYRTEC) 10 MG tablet Take by mouth.   diclofenac  Sodium (VOLTAREN ) 1 % GEL Apply 4 g topically 4 (four) times daily.   EMGALITY  120 MG/ML SOAJ Inject 120 mg into the skin every 30 (thirty) days.   estradiol  (VIVELLE -DOT) 0.05 MG/24HR  patch Place 1 patch (0.05 mg total) onto the skin 2 (two) times a week.   Fezolinetant  (VEOZAH ) 45 MG TABS Take 1 tablet (45 mg total) by mouth daily at 6 (six) AM.   Fezolinetant  (VEOZAH ) 45 MG TABS Take 1 tablet (45 mg total) by mouth daily at 6 (six) AM.   fluconazole  (DIFLUCAN ) 150 MG tablet Take 1 tablet by mouth. If symptoms persist, may take an additional tablet in 3-5 days. May repeat for a total of 3 tablets.   fluticasone  (FLONASE ) 50 MCG/ACT nasal spray Place 2  sprays into both nostrils daily.   gabapentin  (NEURONTIN ) 300 MG capsule TAKE 2 CAPSULES BY MOUTH 3 TIMES DAILY (Patient taking differently: Take 600-1,200 mg by mouth See admin instructions. Take 600mg  by mouth in the morning and 1,200mg  in the evening)   lubiprostone (AMITIZA) 24 MCG capsule TAKE ONE CAPSULE (24 MCG DOSE) BY MOUTH 2 (TWO) TIMES DAILY WITH MEALS.   magnesium  oxide (MAG-OX) 400 MG tablet Take by mouth.   meclizine  (ANTIVERT ) 25 MG tablet Take 1 tablet (25 mg total) by mouth 3 (three) times daily as needed for dizziness.   MELATONIN GUMMIES PO Take 1-2 each by mouth at bedtime.   methylPREDNISolone  (MEDROL  DOSEPAK) 4 MG TBPK tablet As directed on the box (Patient not taking: Reported on 08/23/2024)   mirtazapine  (REMERON ) 15 MG tablet Take 1 tablet (15 mg total) by mouth daily.   Multiple Vitamins-Minerals (EQL [facility] WOMENS) TABS    NUCYNTA 100 MG TABS Take 1 tablet by mouth every 8 (eight) hours as needed.   omeprazole (PRILOSEC) 40 MG capsule Take 40 mg by mouth daily.   ondansetron  (ZOFRAN -ODT) 8 MG disintegrating tablet Take 1 tablet (8 mg total) by mouth every 8 (eight) hours as needed.   PARoxetine  (PAXIL ) 10 MG tablet Take 1 tablet (10 mg total) by mouth daily. Replaces Effexor , for hot flushes, switching out will be not feeling good for a few days (Patient taking differently: Take 5 mg by mouth daily. Replaces Effexor , for hot flushes, switching out will be not feeling good for a few days)   progesterone   (PROMETRIUM ) 100 MG capsule Take 3 capsules (300 mg total) by mouth daily.   promethazine  (PHENERGAN ) 25 MG tablet Take 1 tablet (25 mg total) by mouth every 8 (eight) hours as needed for nausea or vomiting.   senna-docusate (SENOKOT-S) 8.6-50 MG tablet Take 1 tablet by mouth daily.   tiZANidine  (ZANAFLEX ) 4 MG tablet Take 4 mg by mouth every 6 (six) hours as needed for muscle spasms.    topiramate  (TOPAMAX ) 25 MG tablet Take 25 mg by mouth daily.   traZODone (DESYREL) 50 MG tablet Take 1-2 tablets by mouth at bedtime as needed.   triamcinolone  cream (KENALOG ) 0.1 % APPLY TO AFFECTED AREA TWICE A DAY   venlafaxine  XR (EFFEXOR -XR) 150 MG 24 hr capsule TAKE 2 CAPS BY MOUTH DAILY   zolmitriptan  (ZOMIG ) 5 MG tablet Take 1 tablet (5 mg total) by mouth as needed for migraine.   zonisamide (ZONEGRAN) 100 MG capsule Take 100 mg by mouth at bedtime.   No current facility-administered medications on file prior to visit.  There are no discontinued medications.   Physical Exam:    08/25/2024   10:49 AM 08/23/2024   12:37 PM 08/17/2024   11:06 AM  Vitals with BMI  Height 5' 6  5' 6  Weight 254 lbs 13 oz  246 lbs 3 oz  BMI 41.15  39.76  Systolic 126 131 897  Diastolic 76 83 64  Pulse 88 91 105  Vital signs reviewed.  Nursing notes reviewed. Weight trend reviewed. Physical Activity: Not on file   General Appearance:  No acute distress appreciable.   Well-groomed, healthy-appearing female.  Well proportioned with no abnormal fat distribution.  Good muscle tone. Pulmonary:  Normal work of breathing at rest, no respiratory distress apparent. SpO2: 98 %  Musculoskeletal: All extremities are intact.  Neurological:  Awake, alert, oriented, and engaged.  No obvious focal neurological deficits or cognitive impairments.  Sensorium seems unclouded.  Speech is clear and coherent with logical content. Psychiatric:  Appropriate mood, pleasant and cooperative demeanor, thoughtful and engaged during the  exam    Verbalized to patient: Physical Exam HEENT: Normocephalic, normal oropharynx, moist mucous membranes, no swelling of the optic disc bilaterally.Looked into eyes with panoptic plus but could not appreciate papilledema but exam limited by equipment.  Results RADIOLOGY Brain MRI: No Chiari malformation  DIAGNOSTIC Ophthalmic examination: No swelling of the optic disc (08/25/2024)     06/17/2024    1:13 PM 03/08/2024   10:54 AM 06/17/2023   10:20 AM 08/12/2017    2:45 PM  PHQ 2/9 Scores  PHQ - 2 Score 1 0 2 0  PHQ- 9 Score 3 0 7 0   Admission on 07/29/2024, Discharged on 07/30/2024  Component Date Value Ref Range Status   Sodium 07/29/2024 136  135 - 145 mmol/L Final   Potassium 07/29/2024 3.8  3.5 - 5.1 mmol/L Final   Chloride 07/29/2024 101  98 - 111 mmol/L Final   CO2 07/29/2024 21 (L)  22 - 32 mmol/L Final   Glucose, Bld 07/29/2024 106 (H)  70 - 99 mg/dL Final   BUN 90/87/7974 7  6 - 20 mg/dL Final   Creatinine, Ser 07/29/2024 0.86  0.44 - 1.00 mg/dL Final   Calcium  07/29/2024 9.1  8.9 - 10.3 mg/dL Final   GFR, Estimated 07/29/2024 >60  >60 mL/min Final   Anion gap 07/29/2024 14  5 - 15 Final   WBC 07/29/2024 8.3  4.0 - 10.5 K/uL Final   RBC 07/29/2024 4.49  3.87 - 5.11 MIL/uL Final   Hemoglobin 07/29/2024 12.1  12.0 - 15.0 g/dL Final   HCT 90/87/7974 38.7  36.0 - 46.0 % Final   MCV 07/29/2024 86.2  80.0 - 100.0 fL Final   MCH 07/29/2024 26.9  26.0 - 34.0 pg Final   MCHC 07/29/2024 31.3  30.0 - 36.0 g/dL Final   RDW 90/87/7974 14.6  11.5 - 15.5 % Final   Platelets 07/29/2024 362  150 - 400 K/uL Final   nRBC 07/29/2024 0.0  0.0 - 0.2 % Final   Neutrophils Relative % 07/29/2024 54  % Final   Neutro Abs 07/29/2024 4.4  1.7 - 7.7 K/uL Final   Lymphocytes Relative 07/29/2024 35  % Final   Lymphs Abs 07/29/2024 3.0  0.7 - 4.0 K/uL Final   Monocytes Relative 07/29/2024 6  % Final   Monocytes Absolute 07/29/2024 0.5  0.1 - 1.0 K/uL Final   Eosinophils Relative  07/29/2024 4  % Final   Eosinophils Absolute 07/29/2024 0.4  0.0 - 0.5 K/uL Final   Basophils Relative 07/29/2024 1  % Final   Basophils Absolute 07/29/2024 0.1  0.0 - 0.1 K/uL Final   Immature Granulocytes 07/29/2024 0  % Final   Abs Immature Granulocytes 07/29/2024 0.02  0.00 - 0.07 K/uL Final  Office Visit on 03/02/2024  Component Date Value Ref Range Status   D-Dimer, Quant 03/02/2024 1.69 (H)  <0.50 mcg/mL FEU Final  Admission on 02/29/2024, Discharged on 03/01/2024  Component Date Value Ref Range Status   Sodium 02/29/2024 139  135 - 145 mmol/L Final   Potassium 02/29/2024 3.5  3.5 - 5.1 mmol/L Final   Chloride 02/29/2024 103  98 - 111 mmol/L Final   CO2 02/29/2024 26  22 - 32 mmol/L Final   Glucose, Bld 02/29/2024 112 (H)  70 - 99 mg/dL Final   BUN 95/85/7974 9  6 - 20 mg/dL Final  Creatinine, Ser 02/29/2024 0.71  0.44 - 1.00 mg/dL Final   Calcium  02/29/2024 8.8 (L)  8.9 - 10.3 mg/dL Final   GFR, Estimated 02/29/2024 >60  >60 mL/min Final   Anion gap 02/29/2024 10  5 - 15 Final   WBC 02/29/2024 8.9  4.0 - 10.5 K/uL Final   RBC 02/29/2024 4.48  3.87 - 5.11 MIL/uL Final   Hemoglobin 02/29/2024 12.6  12.0 - 15.0 g/dL Final   HCT 95/85/7974 38.6  36.0 - 46.0 % Final   MCV 02/29/2024 86.2  80.0 - 100.0 fL Final   MCH 02/29/2024 28.1  26.0 - 34.0 pg Final   MCHC 02/29/2024 32.6  30.0 - 36.0 g/dL Final   RDW 95/85/7974 13.9  11.5 - 15.5 % Final   Platelets 02/29/2024 313  150 - 400 K/uL Final   nRBC 02/29/2024 0.0  0.0 - 0.2 % Final   Troponin I (High Sensitivity) 02/29/2024 3  <18 ng/L Final   TSH 02/29/2024 1.090  0.350 - 4.500 uIU/mL Final  Office Visit on 02/02/2024  Component Date Value Ref Range Status   Albumin 02/02/2024 4.1  3.9 - 4.9 g/dL Final   Dehydroepiandrosterone 02/02/2024 38  31 - 701 ng/dL Final   LH 96/81/7974 65.0  mIU/mL Final   FSH 02/02/2024 24.3  mIU/mL Final   Prolactin 02/02/2024 16.9  4.8 - 33.4 ng/mL Final   Progesterone  02/02/2024 0.3  ng/mL  Final   Estrogen 02/02/2024 276  pg/mL Final   Sex Hormone Binding 02/02/2024 31.8  24.6 - 122.0 nmol/L Final   Vit D, 25-Hydroxy 02/02/2024 44.8  30.0 - 100.0 ng/mL Final  Office Visit on 09/09/2023  Component Date Value Ref Range Status   Rapid Strep A Screen 09/09/2023 Positive (A)  Negative Final   Influenza A, POC 09/09/2023 Negative  Negative Final   Influenza B, POC 09/09/2023 Negative  Negative Final   SARS Coronavirus 2 Ag 09/09/2023 Negative  Negative Final  Office Visit on 07/13/2023  Component Date Value Ref Range Status   Hemoglobin A1C 07/13/2023 5.8 (A)  4.0 - 5.6 % Final  Admission on 06/20/2023, Discharged on 06/20/2023  Component Date Value Ref Range Status   Lipase 06/20/2023 26  11 - 51 U/L Final   Sodium 06/20/2023 138  135 - 145 mmol/L Final   Potassium 06/20/2023 3.7  3.5 - 5.1 mmol/L Final   Chloride 06/20/2023 103  98 - 111 mmol/L Final   CO2 06/20/2023 27  22 - 32 mmol/L Final   Glucose, Bld 06/20/2023 119 (H)  70 - 99 mg/dL Final   BUN 91/96/7975 10  6 - 20 mg/dL Final   Creatinine, Ser 06/20/2023 0.75  0.44 - 1.00 mg/dL Final   Calcium  06/20/2023 9.4  8.9 - 10.3 mg/dL Final   Total Protein 91/96/7975 7.3  6.5 - 8.1 g/dL Final   Albumin 91/96/7975 4.0  3.5 - 5.0 g/dL Final   AST 91/96/7975 28  15 - 41 U/L Final   ALT 06/20/2023 23  0 - 44 U/L Final   Alkaline Phosphatase 06/20/2023 108  38 - 126 U/L Final   Total Bilirubin 06/20/2023 0.3  0.3 - 1.2 mg/dL Final   GFR, Estimated 06/20/2023 >60  >60 mL/min Final   Anion gap 06/20/2023 8  5 - 15 Final   WBC 06/20/2023 4.8  4.0 - 10.5 K/uL Final   RBC 06/20/2023 4.20  3.87 - 5.11 MIL/uL Final   Hemoglobin 06/20/2023 11.6 (L)  12.0 - 15.0 g/dL Final  HCT 06/20/2023 35.3 (L)  36.0 - 46.0 % Final   MCV 06/20/2023 84.0  80.0 - 100.0 fL Final   MCH 06/20/2023 27.6  26.0 - 34.0 pg Final   MCHC 06/20/2023 32.9  30.0 - 36.0 g/dL Final   RDW 91/96/7975 13.8  11.5 - 15.5 % Final   Platelets 06/20/2023 263  150 -  400 K/uL Final   nRBC 06/20/2023 0.0  0.0 - 0.2 % Final   Color, Urine 06/20/2023 YELLOW  YELLOW Final   APPearance 06/20/2023 CLEAR  CLEAR Final   Specific Gravity, Urine 06/20/2023 >1.046 (H)  1.005 - 1.030 Final   pH 06/20/2023 7.5  5.0 - 8.0 Final   Glucose, UA 06/20/2023 NEGATIVE  NEGATIVE mg/dL Final   Hgb urine dipstick 06/20/2023 NEGATIVE  NEGATIVE Final   Bilirubin Urine 06/20/2023 NEGATIVE  NEGATIVE Final   Ketones, ur 06/20/2023 NEGATIVE  NEGATIVE mg/dL Final   Protein, ur 91/96/7975 TRACE (A)  NEGATIVE mg/dL Final   Nitrite 91/96/7975 NEGATIVE  NEGATIVE Final   Leukocytes,Ua 06/20/2023 NEGATIVE  NEGATIVE Final  Office Visit on 06/17/2023  Component Date Value Ref Range Status   Hepatitis C Ab 06/17/2023 NON-REACTIVE  NON-REACTIVE Final   WBC 06/17/2023 6.0  4.0 - 10.5 K/uL Final   RBC 06/17/2023 4.50  3.87 - 5.11 Mil/uL Final   Platelets 06/17/2023 300.0  150.0 - 400.0 K/uL Final   Hemoglobin 06/17/2023 12.3  12.0 - 15.0 g/dL Final   HCT 92/68/7975 37.6  36.0 - 46.0 % Final   MCV 06/17/2023 83.7  78.0 - 100.0 fl Final   MCHC 06/17/2023 32.6  30.0 - 36.0 g/dL Final   RDW 92/68/7975 14.1  11.5 - 15.5 % Final   Sodium 06/17/2023 138  135 - 145 mEq/L Final   Potassium 06/17/2023 4.3  3.5 - 5.1 mEq/L Final   Chloride 06/17/2023 104  96 - 112 mEq/L Final   CO2 06/17/2023 26  19 - 32 mEq/L Final   Glucose, Bld 06/17/2023 89  70 - 99 mg/dL Final   BUN 92/68/7975 13  6 - 23 mg/dL Final   Creatinine, Ser 06/17/2023 0.84  0.40 - 1.20 mg/dL Final   Total Bilirubin 06/17/2023 0.3  0.2 - 1.2 mg/dL Final   Alkaline Phosphatase 06/17/2023 119 (H)  39 - 117 U/L Final   AST 06/17/2023 30  0 - 37 U/L Final   ALT 06/17/2023 27  0 - 35 U/L Final   Total Protein 06/17/2023 7.8  6.0 - 8.3 g/dL Final   Albumin 92/68/7975 4.3  3.5 - 5.2 g/dL Final   GFR 92/68/7975 86.68  >60.00 mL/min Final   Calcium  06/17/2023 9.4  8.4 - 10.5 mg/dL Final   Cholesterol 92/68/7975 157  0 - 200 mg/dL Final    Triglycerides 06/17/2023 184.0 (H)  0.0 - 149.0 mg/dL Final   HDL 92/68/7975 37.20 (L)  >60.99 mg/dL Final   VLDL 92/68/7975 36.8  0.0 - 40.0 mg/dL Final   LDL Cholesterol 06/17/2023 83  0 - 99 mg/dL Final   Total CHOL/HDL Ratio 06/17/2023 4   Final   NonHDL 06/17/2023 120.15   Final   HIV 1&2 Ab, 4th Generation 06/17/2023 NON-REACTIVE  NON-REACTIVE Final   TSH 06/17/2023 1.62  0.35 - 5.50 uIU/mL Final  Admission on 09/01/2022, Discharged on 09/01/2022  Component Date Value Ref Range Status   SARS Coronavirus 2 by RT PCR 09/01/2022 NEGATIVE  NEGATIVE Final   Influenza A by PCR 09/01/2022 POSITIVE (A)  NEGATIVE Final  Influenza B by PCR 09/01/2022 NEGATIVE  NEGATIVE Final   Group A Strep by PCR 09/01/2022 NOT DETECTED  NOT DETECTED Final  No image results found. MR BRAIN WO CONTRAST Result Date: 08/22/2024  [facility] [address] [place], [place] [address] [phone] NEUROIMAGING REPORT STUDY DATE: 08/22/2024 PATIENT NAME: DESHONDA CRYDERMAN DOB: [birth year] MRN: [id number] EXAM: MRI Brain without contrast ORDERING CLINICIAN: Pastor Falling, MD CLINICAL HISTORY: 43 year old woman with headaches, paresthesias and Chiari malformation COMPARISON FILMS: CT scan of the head 07/29/2024 TECHNIQUE: MRI of the brain without contrast was obtained utilizing 5 mm axial slices with T1, T2, T2 flair, SWI and diffusion weighted views.  T1 sagittal and T2 coronal views were obtained. CONTRAST: none IMAGING SITE:  imaging, [address] [place], [place] FINDINGS: On sagittal images, the spinal cord is imaged caudally to C3 and is normal in caliber.   The cerebellar tonsils extend to the level of the foramen magnum but not below.  This is normal.  The pituitary gland and optic chiasm appear normal.    Brain volume appears normal.   The ventricles are normal in size and without distortion.  There are no abnormal extra-axial collections of fluid.  The cerebellum and brainstem appears  normal.   The deep gray matter appears normal.  The cerebral hemispheres appear normal.  Diffusion weighted images are normal.  Susceptibility weighted images are normal.  The orbits appear normal.   The VIIth/VIIIth nerve complex appears normal.  The mastoid air cells appear normal.  The paranasal sinuses appear normal.  Flow voids are identified within the major intracerebral arteries.     This is a normal noncontrasted MRI of the brain.  There is no evidence of a Chiari malformation. INTERPRETING PHYSICIAN: Richard A. Vear, MD, PhD, FAAN Certified in  Neuroimaging by [employer]    CT Angio Head Neck W WO CM Result Date: 07/29/2024 CLINICAL DATA:  Initial evaluation for acute neuro deficit, stroke suspected. Headache. EXAM: CT ANGIOGRAPHY HEAD AND NECK WITH AND WITHOUT CONTRAST TECHNIQUE: Multidetector CT imaging of the head and neck was performed using the standard protocol during bolus administration of intravenous contrast. Multiplanar CT image reconstructions and MIPs were obtained to evaluate the vascular anatomy. Carotid stenosis measurements (when applicable) are obtained utilizing NASCET criteria, using the distal internal carotid diameter as the denominator. RADIATION DOSE REDUCTION: This exam was performed according to the departmental dose-optimization program which includes automated exposure control, adjustment of the mA and/or kV according to patient size and/or use of iterative reconstruction technique. CONTRAST:  75mL OMNIPAQUE  IOHEXOL  350 MG/ML SOLN COMPARISON:  Prior study from 11/21/2023. FINDINGS: CT HEAD FINDINGS Brain: Cerebral volume within normal limits for patient age. No acute intracranial hemorrhage. No acute large vessel territory infarct. No mass lesion, midline shift, or mass effect. Ventricles are normal in size without hydrocephalus. No extra-axial fluid collection. Mild cerebellar tonsillar ectopia without overt Chiari malformation noted. Vascular: No  abnormal hyperdense vessel. Skull: Scalp soft tissues demonstrate no acute abnormality. Calvarium intact. Sinuses/Orbits: Globes and orbital soft tissues within normal limits. Visualized paranasal sinuses are largely clear. No significant mastoid effusion. CTA NECK FINDINGS Aortic arch: Standard branching. Imaged portion shows no evidence of aneurysm or dissection. No significant stenosis of the major arch vessel origins. Right carotid system: No evidence of dissection, stenosis (50% or greater), or occlusion. Left carotid system: No evidence of dissection, stenosis (50% or greater), or occlusion. Vertebral arteries: No evidence of dissection, stenosis (50% or greater), or  occlusion. Skeleton: No discrete or worrisome osseous lesions. Patient is largely edentulous. Other neck: No other acute finding. Upper chest: No other acute finding. Review of the MIP images confirms the above findings CTA HEAD FINDINGS Anterior circulation: Both internal carotid arteries widely patent to the termini without stenosis. A1 segments widely patent. Normal anterior communicating artery complex. Both anterior cerebral arteries widely patent to their distal aspects without stenosis. No M1 stenosis or occlusion. Normal MCA bifurcations. Distal MCA branches well perfused and symmetric. Posterior circulation: Both V4 segments patent without stenosis. Both PICA patent. Basilar patent without stenosis. Superior cerebral arteries patent bilaterally. Right PCA supplied via the basilar. Fetal type origin left PCA. Both PCAs patent without stenosis. Venous sinuses: Patent allowing for timing the contrast bolus. Anatomic variants: As above.  No aneurysm. Review of the MIP images confirms the above findings IMPRESSION: 1. Normal CTA of the head and neck. No large vessel occlusion, hemodynamically significant stenosis, or other acute vascular abnormality. No aneurysm. 2. No other acute intracranial abnormality. Electronically Signed   By: Morene Hoard M.D.   On: 07/29/2024 19:44   MR Knee Right Wo Contrast Result Date: 07/26/2024 MR KNEE WITHOUT IV CONTRAST RIGHT COMPARISON: 12/10/2023 CLINICAL HISTORY: Meniscal injury, knee. Acute knee pain. History of prior knee surgery. PULSE SEQUENCES: Ax PD FS, Sag T2 ACL, Sag PD FS, Cor PD FS & COR T1 FINDINGS: Bones: There is improved edema in the lateral femoral condyle with compared with the prior examination. 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 There is no fracture or contusion pattern. 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 This has progressed when compared with the prior examination. Evaluation is somewhat limited secondary to motion. Stable chondromalacia the medial and lateral compartments. Extensor mechanism is intact. There is a moderate joint effusion. Ligaments: Mild mucoid degeneration is seen in the ACL with intact fibers. The PCL, MCL and fibular collateral ligaments are intact. Menisci: Medial and lateral menisci are unremarkable. No discrete meniscal tear is present. 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 Reactive edema in the posterior lateral femoral condyle. Small reactive joint effusion. Progressive cartilage loss in the central and lateral patella as above. Correlation for anterior knee symptoms. No ligamentous or meniscal injury. Electronically signed by: Norleen Satchel MD 07/26/2024 11:34 AM EDT RP Workstation: [id number]         ASSESSMENT & PLAN   Assessment & Plan Thunderclap headache Pseudotumor cerebri NPH (normal pressure hydrocephalus) (HCC) Idiopathic intracranial hypertension (pseudotumor cerebri)   Idiopathic intracranial hypertension is suspected due to symptoms and history of pseudotumor cerebri, with Chiari I malformation ruled  out. High intracranial pressure is likely related to weight gain. Neurology and neurosurgery following with - neurosurgery Ordered an MR venogram to evaluate for sinus thrombosis and Refer to an ophthalmologist to assess for papilledema. And Schedule a lumbar puncture to measure intracranial pressure, considering her history of trauma from a previous procedure. Discuss potential GLP-1 agonist therapy for weight management to reduce intracranial pressure.  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 Current management focuses on idiopathic intracranial hypertension as a potential cause. Chronic migraine without aura remains a concern.  Today I agreed to prescribe 11 tablets of oxycodone  after patient pleated for something to help her sleep and agreed to  Toradol  but reported that she already has buprenorphine and it does not help and makes her feel jittery insurance would not pay for Nurtec or [place] she has some other medication that she received from neurology that is not working and open evidence was reviewed and suggest only Tylenol  for headaches from normal pressure hydrocephalus and opioids if this is ineffective as anti-inflammatories are not helpful and she had tried them anyway and they were not working her mother was present and we both expressed our concern about potential for overuse relapse and triggering but at the same time understood that she is having insomnia persistent severe headache unable to get sleep so I gave her enough tablets to get to planned lumbar puncture to have 1 for each night for sleep  Morbid obesity (HCC) Obesity is contributing to idiopathic intracranial hypertension. Weight loss is recommended to alleviate symptoms. Pursue insurance coverage for GLP-1 agonist therapy. Consider alternative medication procurement through a compounding pharmacy if insurance denies coverage. OSA  (obstructive sleep apnea) Will try for Zepbound     ORDER ASSOCIATIONS  #   DIAGNOSIS / CONDITION ICD-10 ENCOUNTER ORDER     ICD-10-CM   1. Thunderclap headache  G44.53 ketorolac  (TORADOL ) injection 60 mg    oxyCODONE -acetaminophen  (PERCOCET) 10-325 MG tablet    Erenumab-aooe SOAJ 140 mg    2. Pseudotumor cerebri  G93.2     3. NPH (normal pressure hydrocephalus) (HCC)  G91.2          Orders Placed in Encounter:    Meds ordered this encounter  Medications   ketorolac  (TORADOL ) injection 60 mg   oxyCODONE -acetaminophen  (PERCOCET) 10-325 MG tablet    Sig: Take 1 tablet by mouth every 8 (eight) hours as needed for up to 5 days for pain.    Dispense:  11 tablet    Refill:  0   Erenumab-aooe SOAJ 140 mg      This document was synthesized by artificial intelligence (Abridge) using HIPAA-compliant recording of the clinical interaction;   We discussed the use of AI scribe software for clinical note transcription with the patient, who gave verbal consent to proceed. additional Info: This encounter employed state-of-the-art, real-time, collaborative documentation. The patient actively reviewed and assisted in updating their electronic medical record on a shared screen, ensuring transparency and facilitating joint problem-solving for the problem list, overview, and plan. This approach promotes accurate, informed care. The treatment plan was discussed and reviewed in detail, including medication safety, potential side effects, and all patient questions. We confirmed understanding and comfort with the plan. Follow-up instructions were established, including contacting the office for any concerns, returning if symptoms worsen, persist, or new symptoms develop, and precautions for potential emergency department visits.   Prior authorization support letterwritten today:  Diagnosis Codes:  E66.01 (Morbid obesity) G93.2 (Idiopathic intracranial hypertension) G47.33 (Obstructive sleep  apnea) Medication Requested: Zepbound  (tirzepatide ) Dose: [Specify dose requested] Duration: [Specify duration]  Clinical Rationale:  Ms. Chrestman is a 43 year old female with a complex history including idiopathic intracranial hypertension (IIH/pseudotumor cerebri), morbid obesity (BMI >40), and obstructive sleep apnea (OSA). She has experienced persistent, debilitating headaches, pulsatile tinnitus, and vertigo, with a documented diagnosis of IIH dating back over 20 years. Her condition has been evaluated and confirmed by neurology and neurosurgery, with imaging ruling out alternative etiologies (e.g., Chiari malformation, aneurysm, mass lesion).  Weight loss is a first-line, evidence-based intervention for IIH, as it directly reduces intracranial pressure and mitigates risk of vision loss and neurologic sequelae. Ms. Mcginness has struggled to  lose weight despite lifestyle interventions and faces significant barriers to improvement due to chronic pain, mobility limitations, and comorbid psychiatric conditions.  She also has mild OSA, further exacerbated by obesity, and is at increased risk for cardiometabolic complications.  Recent neurosurgical evaluation specifically recommends initiation of a GLP-1 agonist for weight management, as conservative measures have failed and her condition remains refractory. Zepbound  (tirzepatide ) is requested due to its dual GLP-1/GIP agonism and superior efficacy in weight reduction compared to alternatives. This therapy is medically necessary to address her IIH, reduce intracranial pressure, and improve her overall health trajectory.  Alternative treatments:  Lifestyle interventions have been attempted and have not resulted in adequate weight loss. Other anti-obesity medications are either contraindicated, less effective, or not tolerated. Risks of untreated obesity/IIH:  Progressive headaches, risk of vision loss, and potential for permanent neurologic  injury. Worsening OSA and cardiometabolic risk. Supporting documentation:  Neurology and neurosurgery notes documenting IIH and recommendation for GLP-1 therapy. BMI consistently >40. History of failed weight loss attempts. MRI and CT imaging ruling out alternative causes. Request: We request approval for Zepbound  (tirzepatide ) for this patient, as it is medically necessary for the management of idiopathic intracranial hypertension and morbid obesity, with the goal of reducing intracranial pressure, preventing vision loss, and improving comorbid OSA and overall health.  Please contact me for any additional information or documentation.  Bernardino KANDICE Cone MD, ABIM-certified [place] Medical License #[id number] [facility] [address] [place] [address] Dept: [phone] Dept Fax: [phone]    Attachments:  Neurology/neurosurgery notes Recent BMI/vitals Imaging reports Medication history Failed weight loss documentation Tips:  Attach relevant clinical notes, imaging, and neurosurgical recommendations. Specify failed attempts at lifestyle and other therapies. If available, cite guidelines (e.g., AAN, Endocrine Society) supporting GLP-1 use in IIH/obesity. Let me know if you need this in a specific format (fax, EMR template, etc.) or want to add more details!  copyIcon RegenerateRegenerate Jump to downJump to Bottom Type a message or type / to select a prompt... Response Mode responseButtonAuto-Detectdownarrow

## 2024-08-25 NOTE — Assessment & Plan Note (Signed)
 Obesity is contributing to idiopathic intracranial hypertension. Weight loss is recommended to alleviate symptoms. Pursue insurance coverage for GLP-1 agonist therapy. Consider alternative medication procurement through a compounding pharmacy if insurance denies coverage.

## 2024-08-25 NOTE — Assessment & Plan Note (Signed)
 Will try for Zepbound

## 2024-08-26 ENCOUNTER — Ambulatory Visit
Admission: RE | Admit: 2024-08-26 | Discharge: 2024-08-26 | Disposition: A | Discharge status: Home / Self Care | Source: Ambulatory Visit | Attending: Neurosurgery | Admitting: Neurosurgery

## 2024-08-26 ENCOUNTER — Telehealth: Payer: Self-pay

## 2024-08-26 ENCOUNTER — Other Ambulatory Visit: Payer: Self-pay | Admitting: Pharmacist

## 2024-08-26 ENCOUNTER — Inpatient Hospital Stay: Admission: RE | Admit: 2024-08-26 | Source: Ambulatory Visit

## 2024-08-26 ENCOUNTER — Encounter: Payer: Self-pay | Admitting: Pharmacist

## 2024-08-26 ENCOUNTER — Other Ambulatory Visit (HOSPITAL_COMMUNITY): Payer: Self-pay

## 2024-08-26 ENCOUNTER — Other Ambulatory Visit: Payer: Self-pay | Admitting: *Deleted

## 2024-08-26 DIAGNOSIS — Z006 Encounter for examination for normal comparison and control in clinical research program: Secondary | ICD-10-CM

## 2024-08-26 DIAGNOSIS — G932 Benign intracranial hypertension: Secondary | ICD-10-CM

## 2024-08-26 NOTE — Telephone Encounter (Signed)
 Pharmacy Patient Advocate Encounter   Received notification from Physician's Office that prior authorization for Zepbound  2.5MG /0.5ML Auto-injectors is required/requested.   Insurance verification completed.   The patient is insured through [facility].     Per test claim, medication is not covered due to plan/benefit exclusion, PA not submitted at this time

## 2024-08-26 NOTE — Discharge Instructions (Signed)
 Lumbar Puncture Discharge Instructions  You may resume normal activities; however, do not exert yourself strongly or do any heavy lifting today and tomorrow.   DO NOT drive today.    You may resume your normal diet and medications unless otherwise indicated. Drink a lot of extra fluids today and tomorrow.   The incidence of a spinal headache (headache, nausea and/or vomiting is about 5% (one in 20 patients).  If you develop a headache when you are sitting up or standing that gets better when you lie down, please lie flat for 24 hours and drink plenty of fluids until the headache goes away.  Caffeinated beverages may be helpful.   If you develop severe nausea and vomiting or a headache that does not go away with the flat bedrest after 48 hours, please call [phone].   Call your physician for a follow-up appointment.  The results of your myelogram will be sent directly to your physician by the following day.  Please call us  at [phone] if you have any questions or if complications develop after you arrive home.     Thank you for visiting [facility] today!

## 2024-08-29 ENCOUNTER — Other Ambulatory Visit: Payer: Self-pay

## 2024-08-29 ENCOUNTER — Telehealth: Payer: Self-pay

## 2024-08-29 ENCOUNTER — Encounter: Payer: Self-pay | Admitting: Internal Medicine

## 2024-08-29 DIAGNOSIS — G932 Benign intracranial hypertension: Secondary | ICD-10-CM

## 2024-08-29 DIAGNOSIS — G971 Other reaction to spinal and lumbar puncture: Secondary | ICD-10-CM

## 2024-08-29 NOTE — Telephone Encounter (Signed)
 Patient called office asking if Dr. Rosslyn could place a STAT order for Blood Patch at the [facility] due to her concerns of headache after her Lumbar Puncture on Friday.   Patient states that she did 48 hours of bed rest after her LP. Her headache has been worse since the procedure.  She woke up this morning and experienced severe positional headaches with nausea. She states there is a noticeable improvement in her head when she is laying down. If possible she would like to have this done today, she has already spoke to staff at [facility] who can do it as soon as the order is placed.   I explained that Dr. Janjua is in the OR today. I told her that I would send an urgent message to both providers to see if they could give me a verbal to place this order.

## 2024-08-29 NOTE — Telephone Encounter (Signed)
 Copied from CRM [id number]. Topic: Clinical - Request for Lab/Test Order >> Aug 29, 2024  8:19 AM Berneda FALCON wrote: Reason for CRM: Patient states on Friday, she had to have a spinal tap done and now she has a severe spinal headache. States they need an order sent over for a spinal blood patch. [facility] needs order sent over STAT order sent over. Patient states Dr. Jesus know about this already and was already thinking of doing this for her.  This is the [facility] on [address] [place] [address]  Patient callback is [phone] (home)

## 2024-08-29 NOTE — Addendum Note (Signed)
 Addended by: Heavyn Yearsley G on: 08/29/2024 09:30 PM   Modules accepted: Orders

## 2024-08-30 ENCOUNTER — Other Ambulatory Visit: Payer: Self-pay | Admitting: Pharmacist

## 2024-08-30 ENCOUNTER — Other Ambulatory Visit: Payer: Self-pay | Admitting: Internal Medicine

## 2024-08-30 ENCOUNTER — Encounter: Payer: Self-pay | Admitting: Internal Medicine

## 2024-08-30 ENCOUNTER — Encounter: Payer: Self-pay | Admitting: Pharmacist

## 2024-08-30 ENCOUNTER — Ambulatory Visit
Admission: RE | Admit: 2024-08-30 | Discharge: 2024-08-30 | Disposition: A | Discharge status: Home / Self Care | Source: Ambulatory Visit | Attending: Internal Medicine | Admitting: Internal Medicine

## 2024-08-30 DIAGNOSIS — G4733 Obstructive sleep apnea (adult) (pediatric): Secondary | ICD-10-CM

## 2024-08-30 DIAGNOSIS — G4453 Primary thunderclap headache: Secondary | ICD-10-CM

## 2024-08-30 DIAGNOSIS — G971 Other reaction to spinal and lumbar puncture: Secondary | ICD-10-CM

## 2024-08-30 DIAGNOSIS — G912 (Idiopathic) normal pressure hydrocephalus: Secondary | ICD-10-CM

## 2024-08-30 DIAGNOSIS — G932 Benign intracranial hypertension: Secondary | ICD-10-CM

## 2024-08-30 MED ORDER — IOPAMIDOL (ISOVUE-M 200) INJECTION 41%
1.0000 mL | Freq: Once | INTRAMUSCULAR | Status: AC
  Administered 2024-08-30: 1 mL via EPIDURAL

## 2024-08-30 NOTE — Discharge Instructions (Signed)
 Lumbar Puncture Discharge Instructions  You may resume normal activities; however, do not exert yourself strongly or do any heavy lifting today and tomorrow.   DO NOT drive today.    You may resume your normal diet and medications unless otherwise indicated. Drink a lot of extra fluids today and tomorrow.   The incidence of a spinal headache (headache, nausea and/or vomiting is about 5% (one in 20 patients).  If you develop a headache when you are sitting up or standing that gets better when you lie down, please lie flat for 24 hours and drink plenty of fluids until the headache goes away.  Caffeinated beverages may be helpful.   If you develop severe nausea and vomiting or a headache that does not go away with the flat bedrest after 48 hours, please call [phone].   Call your physician for a follow-up appointment.  The results of your myelogram will be sent directly to your physician by the following day.  Please call us  at [phone] if you have any questions or if complications develop after you arrive home.     Thank you for visiting [facility] today!

## 2024-08-30 NOTE — Telephone Encounter (Signed)
 Placed order

## 2024-08-31 ENCOUNTER — Ambulatory Visit (INDEPENDENT_AMBULATORY_CARE_PROVIDER_SITE_OTHER): Admitting: Physician Assistant

## 2024-08-31 ENCOUNTER — Emergency Department (HOSPITAL_BASED_OUTPATIENT_CLINIC_OR_DEPARTMENT_OTHER)
Admission: EM | Admit: 2024-08-31 | Discharge: 2024-08-31 | Disposition: A | Discharge status: Home / Self Care | Attending: Emergency Medicine | Admitting: Emergency Medicine

## 2024-08-31 ENCOUNTER — Other Ambulatory Visit: Payer: Self-pay

## 2024-08-31 ENCOUNTER — Encounter: Payer: Self-pay | Admitting: Physician Assistant

## 2024-08-31 ENCOUNTER — Telehealth: Payer: Self-pay

## 2024-08-31 ENCOUNTER — Encounter (HOSPITAL_BASED_OUTPATIENT_CLINIC_OR_DEPARTMENT_OTHER): Payer: Self-pay | Admitting: *Deleted

## 2024-08-31 ENCOUNTER — Ambulatory Visit: Payer: Self-pay

## 2024-08-31 ENCOUNTER — Other Ambulatory Visit (HOSPITAL_COMMUNITY): Payer: Self-pay

## 2024-08-31 VITALS — BP 102/70 | HR 94 | Temp 96.8°F | Ht 66.0 in | Wt 253.5 lb

## 2024-08-31 DIAGNOSIS — L509 Urticaria, unspecified: Secondary | ICD-10-CM | POA: Diagnosis not present

## 2024-08-31 DIAGNOSIS — H6192 Disorder of left external ear, unspecified: Secondary | ICD-10-CM

## 2024-08-31 DIAGNOSIS — R21 Rash and other nonspecific skin eruption: Secondary | ICD-10-CM

## 2024-08-31 DIAGNOSIS — R591 Generalized enlarged lymph nodes: Secondary | ICD-10-CM | POA: Diagnosis not present

## 2024-08-31 MED ORDER — PREDNISONE 10 MG PO TABS: ORAL_TABLET | ORAL | 0 refills | Status: DC

## 2024-08-31 MED ORDER — METHYLPREDNISOLONE ACETATE 80 MG/ML IJ SUSP
80.0000 mg | Freq: Once | INTRAMUSCULAR | Status: AC
  Administered 2024-08-31: 80 mg via INTRAMUSCULAR

## 2024-08-31 MED ORDER — DIPHENHYDRAMINE HCL 50 MG/ML IJ SOLN
25.0000 mg | Freq: Once | INTRAMUSCULAR | Status: AC
  Administered 2024-08-31: 25 mg via INTRAVENOUS
  Filled 2024-08-31: qty 1

## 2024-08-31 MED ORDER — ALPRAZOLAM 0.5 MG PO TABS: 0.5000 mg | ORAL_TABLET | Freq: Three times a day (TID) | ORAL | 0 refills | Status: DC | PRN

## 2024-08-31 MED ORDER — METHYLPREDNISOLONE SODIUM SUCC 125 MG IJ SOLR
125.0000 mg | Freq: Once | INTRAMUSCULAR | Status: AC
  Administered 2024-08-31: 125 mg via INTRAVENOUS
  Filled 2024-08-31: qty 2

## 2024-08-31 MED ORDER — HYDROXYZINE HCL 50 MG PO TABS: 50.0000 mg | ORAL_TABLET | Freq: Three times a day (TID) | ORAL | 0 refills | Status: DC | PRN

## 2024-08-31 MED ORDER — FAMOTIDINE IN NACL 20-0.9 MG/50ML-% IV SOLN
20.0000 mg | Freq: Once | INTRAVENOUS | Status: AC
  Administered 2024-08-31: 20 mg via INTRAVENOUS
  Filled 2024-08-31: qty 50

## 2024-08-31 NOTE — ED Triage Notes (Addendum)
 Pt to ED reporting a rash covering her entire body that started yesterday. Pt had a blood patch done yesterday after having migraines from a spinal tap 1 week ago for a pseudotumor in her brain. When rash and itching started today patient was taking Benadryl  q 4 hours without relief. Patient went to PCP and was given a steroid shot and hydroxyzine PO without improvement.   Patient reporting she was given contrast dye during the procedure.

## 2024-08-31 NOTE — Discharge Instructions (Signed)
 Continue the antihistamines and the steroid medication prescribed by her doctor.  You can increase your Xanax to 0.5 mg over the next few days to help with the itching and discomfort.

## 2024-08-31 NOTE — ED Provider Notes (Signed)
 [place] [facility]MENT AT [facility] Provider Note   CSN: [id number] Arrival date & time: 08/31/24  1752     Patient presents with: Rash   Diane [name] is a 43 y.o. female.    Rash    Patient presents to the ED for evaluation of a rash.  Patient has history of ADD depression IBS acid reflux, anxiety, peripheral vascular disease, migraines, IIH.  Patient had an LP recently.  There was concern for possible CSF leak.  Patient had a blood patch procedure.  Patient states following that procedure she started to develop a rash.  Patient states she developed urticaria and it has been increasing throughout her entire torso, head and face.  She is not having any difficulty with her breathing but she is itching.  She went saw her primary care doctor today and was given steroids and antihistamines.  She was given prescriptions.  Patient came to the ED today because she is not feeling better and feels like it is getting worse.  She is not having difficulty breathing  Prior to Admission medications   Medication Sig Start Date End Date Taking? Authorizing Provider  ADDERALL XR 20 MG 24 hr capsule Take 20 mg by mouth every morning. 07/09/18   [provider]  albuterol  (PROVENTIL  HFA;VENTOLIN  HFA) 108 (90 Base) MCG/ACT inhaler Inhale 2 puffs into the lungs every 6 (six) hours as needed for wheezing or shortness of breath.     [provider]  ALPRAZolam (XANAX) 0.5 MG tablet Take 1 tablet (0.5 mg total) by mouth 3 (three) times daily as needed for up to 12 doses for anxiety. 08/31/24   Randol Simmonds, MD  atorvastatin  (LIPITOR) 40 MG tablet TAKE 1 TABLET BY MOUTH EVERY DAY 06/03/24   Jesus Bernardino MATSU, MD  Bacillus Coagulans-Inulin (PROBIOTIC) 1-250 BILLION-MG CAPS     [provider]  cetirizine (ZYRTEC) 10 MG tablet Take by mouth. 06/16/17   [provider]  diclofenac  Sodium (VOLTAREN ) 1 % GEL Apply 4 g topically 4 (four) times daily. 07/14/24    Genelle Standing, MD  EMGALITY  120 MG/ML SOAJ Inject 120 mg into the skin every 30 (thirty) days. 06/17/24   Jesus Bernardino MATSU, MD  estradiol  (VIVELLE -DOT) 0.05 MG/24HR patch Place 1 patch (0.05 mg total) onto the skin 2 (two) times a week. 02/04/24   Jesus Bernardino MATSU, MD  Fezolinetant  (VEOZAH ) 45 MG TABS Take 1 tablet (45 mg total) by mouth daily at 6 (six) AM. 08/10/24   Jesus Bernardino MATSU, MD  Fezolinetant  (VEOZAH ) 45 MG TABS Take 1 tablet (45 mg total) by mouth daily at 6 (six) AM. 08/12/24   Jesus Bernardino MATSU, MD  fluticasone  (FLONASE ) 50 MCG/ACT nasal spray Place 2 sprays into both nostrils daily. 09/09/23   Jesus Bernardino MATSU, MD  gabapentin  (NEURONTIN ) 300 MG capsule TAKE 2 CAPSULES BY MOUTH 3 TIMES DAILY Patient taking differently: Take 600-1,200 mg by mouth See admin instructions. Take 600mg  by mouth in the morning and 1,200mg  in the evening 05/26/17   Johnny Garnette LABOR, MD  hydrOXYzine (ATARAX) 50 MG tablet Take 1 tablet (50 mg total) by mouth 3 (three) times daily as needed. 08/31/24   Job Lukes, PA  lubiprostone (AMITIZA) 24 MCG capsule TAKE ONE CAPSULE (24 MCG DOSE) BY MOUTH 2 (TWO) TIMES DAILY WITH MEALS. 05/18/24   Jesus Bernardino MATSU, MD  magnesium  oxide (MAG-OX) 400 MG tablet Take by mouth. 06/16/17   [provider]  meclizine  (ANTIVERT ) 25 MG tablet  Take 1 tablet (25 mg total) by mouth 3 (three) times daily as needed for dizziness. 07/30/24   [name], PA-C  MELATONIN GUMMIES PO Take 1-2 each by mouth at bedtime.    [provider]  mirtazapine  (REMERON ) 15 MG tablet Take 1 tablet (15 mg total) by mouth daily. 06/17/24   Jesus Bernardino MATSU, MD  Multiple Vitamins-Minerals [facility] WOMENS) TABS     [provider]  NUCYNTA 100 MG TABS Take 1 tablet by mouth every 8 (eight) hours as needed.    [provider]  omeprazole (PRILOSEC) 40 MG capsule Take 40 mg by mouth daily. 06/06/21   [provider]  ondansetron  (ZOFRAN -ODT) 8 MG disintegrating  tablet Take 1 tablet (8 mg total) by mouth every 8 (eight) hours as needed. 06/17/24   Jesus Bernardino MATSU, MD  PARoxetine  (PAXIL ) 10 MG tablet Take 1 tablet (10 mg total) by mouth daily. Replaces Effexor , for hot flushes, switching out will be not feeling good for a few days Patient taking differently: Take 5 mg by mouth daily. Replaces Effexor , for hot flushes, switching out will be not feeling good for a few days 06/19/24   Jesus Bernardino MATSU, MD  predniSONE  (DELTASONE ) 10 MG tablet Take two tablets daily x 1 week, then one tablet daily x 1 week. 08/31/24   Job Lukes, PA  progesterone  (PROMETRIUM ) 100 MG capsule Take 3 capsules (300 mg total) by mouth daily. 06/17/24   Jesus Bernardino MATSU, MD  promethazine  (PHENERGAN ) 25 MG tablet Take 1 tablet (25 mg total) by mouth every 8 (eight) hours as needed for nausea or vomiting. 08/14/17   Gladis Elsie BROCKS, PA-C  senna-docusate (SENOKOT-S) 8.6-50 MG tablet Take 1 tablet by mouth daily. 06/20/23   Prosperi, Christian H, PA-C  tirzepatide  (ZEPBOUND ) 2.5 MG/0.5ML Pen Inject 2.5 mg into the skin once a week. Patient not taking: Reported on 08/31/2024 08/25/24   Jesus Bernardino MATSU, MD  tiZANidine  (ZANAFLEX ) 4 MG tablet Take 4 mg by mouth every 6 (six) hours as needed for muscle spasms.  11/18/16   [provider]  topiramate  (TOPAMAX ) 25 MG tablet Take 25 mg by mouth daily. 07/12/24   [provider]  traZODone (DESYREL) 50 MG tablet Take 1-2 tablets by mouth at bedtime as needed. 02/11/24   [provider]  triamcinolone  cream (KENALOG ) 0.1 % APPLY TO AFFECTED AREA TWICE A DAY 08/02/24   Jesus Bernardino MATSU, MD  venlafaxine  XR (EFFEXOR -XR) 150 MG 24 hr capsule TAKE 2 CAPS BY MOUTH DAILY 08/30/24   Jesus Bernardino MATSU, MD  zolmitriptan  (ZOMIG ) 5 MG tablet Take 1 tablet (5 mg total) by mouth as needed for migraine. 08/11/24   Camara, Amadou, MD  zonisamide (ZONEGRAN) 100 MG capsule Take 100 mg by mouth at bedtime. 07/18/22   [provider]     Allergies: Droperidol , Iodinated contrast media, Metoclopramide , Nickel, Prochlorperazine, Nsaids, Tolmetin, Oxybutynin , Adhesive [tape], Metoclopramide  hcl, Prochlorperazine edisylate, and Sulfa antibiotics    Review of Systems  Skin:  Positive for rash.    Updated Vital Signs BP 136/81   Pulse 96   Temp 98.2 F (36.8 C) (Oral)   Resp 14   LMP 08/26/2018   SpO2 100%   Physical Exam Vitals and nursing note reviewed.  Constitutional:      General: She is not in acute distress.    Appearance: She is well-developed.  HENT:     Head: Normocephalic and atraumatic.     Right Ear: External ear  normal.     Left Ear: External ear normal.     Mouth/Throat:     Mouth: Mucous membranes are moist.     Pharynx: No posterior oropharyngeal erythema.     Comments: No oropharyngeal edema Eyes:     General: No scleral icterus.       Right eye: No discharge.        Left eye: No discharge.     Conjunctiva/sclera: Conjunctivae normal.  Neck:     Trachea: No tracheal deviation.  Cardiovascular:     Rate and Rhythm: Normal rate.  Pulmonary:     Effort: Pulmonary effort is normal. No respiratory distress.     Breath sounds: No stridor.  Abdominal:     General: There is no distension.  Musculoskeletal:        General: No swelling or deformity.     Cervical back: Neck supple.  Skin:    General: Skin is warm and dry.     Findings: Rash present. Rash is urticarial.     Comments: Diffuse urticarial rash involving torso and head  Neurological:     Mental Status: She is alert. Mental status is at baseline.     Cranial Nerves: No dysarthria or facial asymmetry.     Motor: No seizure activity.     (all labs ordered are listed, but only abnormal results are displayed) Labs Reviewed - No data to display  EKG: None  Radiology: [facility] ANG DIAG/THERA/INC NEEDLE/CATH/PLC EPI/LUMB/SAC W/IMG Result Date: 08/30/2024 CLINICAL DATA:  43 year old female with history of persistent headache  after lumbar puncture on 08/26/2024. EXAM: DG INJECT/THERA INC NEEDLE/CATH/PLC EPI/LUMB/SAC W/IMG PROCEDURE: The procedure, risks, benefits, and alternatives were explained to the patient. Questions regarding the procedure were encouraged and answered. The patient understands and consents to the procedure. LUMBAR EPIDURAL BLOOD PATCH: 15 ml of blood were withdrawn from the patient's antecubital fossa. An epidural approach was taken on the right at L3-L4 using a 20 gauge epidural needle. Epidural positioning was confirmed by injecting a small amount of 1.50ml Isovue -M 200. There was no vascular communication. Five ml of the patient's blood was slowly injected into the epidural space in this location. The procedure was well-tolerated and she was discharged in good condition with instructions to lie down for additional day. IMPRESSION: Lumbar epidural blood patch on the right at L3-L4. Ester Sides, MD [facility] Radiology Electronically Signed   By: Ester Sides M.D.   On: 08/30/2024 12:12     Procedures   Medications Ordered in the ED  diphenhydrAMINE  (BENADRYL ) injection 25 mg (25 mg Intravenous Given 08/31/24 1825)  methylPREDNISolone  sodium succinate (SOLU-MEDROL ) 125 mg/2 mL injection 125 mg (125 mg Intravenous Given 08/31/24 1826)  famotidine  (PEPCID ) IVPB 20 mg premix (0 mg Intravenous Stopped 08/31/24 1914)                                    Medical Decision Making Risk Prescription drug management.   Patient presents with an urticarial rash.  Patient states it could possibly be related to the contrast that was used for her blood patch.  Patient is not having any difficulty breathing.  No signs of anaphylaxis.  She certainly does have diffuse urticaria.  Will give her additional dose of steroids and antihistamines.  Encouraged her to continue the prescriptions she was given by her primary care doctor today     Final  diagnoses:   Urticaria    ED Discharge Orders          Ordered    ALPRAZolam (XANAX) 0.5 MG tablet  3 times daily PRN        08/31/24 1916               Randol Simmonds, MD 08/31/24 1916

## 2024-08-31 NOTE — Telephone Encounter (Signed)
 Noted

## 2024-08-31 NOTE — Patient Instructions (Signed)
 It was great to see you!  Continue allegra Start Pepcid   Replace benadryl  with hydroxyzine Steroid shot today and start ORAL steroid tomorrow  Use bacitracin to back of ear and follow up if no improvement  Let Primary Care Provider (PCP) know if lymph node worsens or does not fully resolve in 4 weeks  Please go to the ER if any worsening  Take care,  Lucie Buttner PA-C

## 2024-08-31 NOTE — Telephone Encounter (Signed)
 FYI Only or Action Required?: Action required by provider: update on patient condition.  Patient was last seen in primary care on 08/25/2024 by Jesus Bernardino MATSU, MD.  Called Nurse Triage reporting Allergic Reaction.  Symptoms began yesterday.  Interventions attempted: OTC medications: benadryl  .  Symptoms are: gradually worsening.  Triage Disposition: Go to ED Now (Notify PCP)  Patient/caregiver understands and will follow disposition?: No, refuses dispositionCopied from CRM [id number]. Topic: Clinical - Red Word Triage >> Aug 31, 2024  8:52 AM Thersia BROCKS wrote: Red Word that prompted transfer to Nurse Triage: Patient called in stated she had a allergy  blood test at [facility] yesterday, since then has broken out in red and itchy hives has been taking medication but isnt doing anything for it Reason for Disposition  Swollen tongue  Answer Assessment - Initial Assessment Questions Pt received blood patch yesterday due to spinal fluid withdrawal last Friday. Pt is on bedrest for 4 days. Pt stated the whole body rash started soon after getting home from receiving blood patch. Pt denies any swelling or trouble breathing.  This rash is over my whole body. Itching is severe. Ive been taking Benadryl  every 4 hours and it's not working. RN advised ED, but pt requested  to speak to office. RN transferred call to [facility]. Spoke with Zachary to take of pt.       1. APPEARANCE: What does the rash look like?      Red bumps-raised 2. LOCATION: Where is the rash located?      Whole body  3. NUMBER: How many hives are there?      na 4. SIZE: How big are the hives? (e.g., inches, cm, compare to coins) Do they all look the same or do they vary in shape and size?      na 5. ONSET: When did the hives begin? (e.g., hours or days ago)      Yesterday  6. ITCHING: Does it itch? If Yes, ask: How bad is the itch?  (e.g., none, mild, moderate, severe)     severe 7. RECURRENT PROBLEM: Have you had  hives before? If Yes, ask: When was the last time? and What happened that time?      na 8. TRIGGERS: Were you exposed to any new food, plant, cosmetic product or animal just before the hives began?     Blood patch yesterday  9. OTHER SYMPTOMS: Do you have any other symptoms? (e.g., fever, tongue swelling, difficulty breathing, abdomen pain)     denies  Protocols used: Hives-A-AH

## 2024-08-31 NOTE — Telephone Encounter (Signed)
 Pharmacy Patient Advocate Encounter  Received notification from [facility] that Prior Authorization for Zepbound  2.5MG /0.5ML Auto-injectors has been DENIED.  Full denial letter will be uploaded to the media tab. See denial reason below.    PA #/Case ID/Reference #: [id number]

## 2024-08-31 NOTE — Progress Notes (Signed)
 Diane [name] is a 43 y.o. female here for a new problem.  History of Present Illness:   Chief Complaint  Patient presents with   Rash    Pt c/o rash all over body, itching, started yesterday after having a blood patch done at Imaging. Taking Benadryl  every 4 hours no relief.   Discussed the use of AI scribe software for clinical note transcription with the patient, who gave verbal consent to proceed.  History of Present Illness   Diane [name] is a 43 year old female who presents with a widespread rash following a recent blood patch procedure.  A widespread rash developed after a blood patch procedure, starting on the breast and spreading, with significant pruritus. She takes Benadryl  every four hours and Allegra once daily, but the itching persists.  The blood patch was performed following a spinal tap due to a severe headache. The procedure was extremely painful, and she was on bed rest for 24 hours post-procedure. The headache has since improved.  No new medications or known food allergies. Her diet is unchanged. She is frustrated as the itching disrupts her bed rest.  A painful lymph node under her right ear was noticed yesterday, with no recent infections or respiratory symptoms.   A sore on the back of her left ear, accidentally scratched, bled and crusted over.        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 of being hard to wake up post-op   Gastric ulcer 03/16/2015   GERD (gastroesophageal reflux disease)    Headache 07/09/2007   Qualifier: Diagnosis of   By: Tammie MD, Bruce      Replacing diagnoses that were inactivated after the 02/16/23 regulatory import     History of gastric ulcer    History of kidney stones    Hyperlipidemia    IBS (irritable bowel syndrome)    Lower extremity edema 07/26/2021   Lower extremity edema     Migraines    neurologist-  dr c. hagen ([facility] in [place])-- treated with Emgality  injection every 30 days   Mild obstructive sleep apnea    per study in epic 03-21-2017 mild osa, recommendation cpap, mouth appliance, wt loss   Nausea and vomiting 12/03/2016   OAB (overactive bladder) 08/12/2017   Osteoarthritis    right ankle   Osteochondral lesion 09/2016   right ankle   Osteomyelitis of knee region [facility])    Peripheral vascular disease    NERVE DAMAGE RIGHT LEG AND FOOT DUE TO INJURGY.    Plantar fasciitis of left foot    Pleural effusion 12/15/2013   PONV (postoperative nausea and vomiting)    AND HEADACHES   Pseudotumor cerebri    at back head  RUQ pain    S/P dilatation of esophageal stricture 07/2018   Sinusitis 08/12/2018   RESOLVED WITH ANTIOBIOTIC   SUI (stress urinary incontinence, female)    Wears contact lenses    Wears partial dentures    upper     Social History   Tobacco Use   Smoking status: Former    Current packs/day: 0.50    Average packs/day: 0.5 packs/day for 15.0 years (7.5 ttl pk-yrs)    Types: Cigarettes   Smokeless tobacco: Never  Vaping Use   Vaping status: Every Day   Substances: Nicotine   Substance Use Topics   Alcohol use: Yes    Comment: occasional   Drug use: No    Past Surgical History:  Procedure Laterality Date   ANKLE ARTHROSCOPY Right 10/22/2016   Procedure: ANKLE ARTHROSCOPY;  Surgeon: Donnice JONELLE Fees, DPM;  Location: [facility];  Service: Podiatry;  Laterality: Right;  GENERAL/REG BLOCK   ANKLE ARTHROSCOPY Right 09-15-2017   @[facility]    BLADDER SURGERY  child   x 2 - as a child to stretch bladder   CESAREAN SECTION  12/24/2009   CESAREAN SECTION  11/20/2011   Procedure: CESAREAN SECTION;  Surgeon: Charlie JINNY Flowers, MD;  Location: [facility];  Service: Gynecology;  Laterality: N/A;   CHOLECYSTECTOMY N/A 12/03/2016   Procedure: LAPAROSCOPIC CHOLECYSTECTOMY WITH INTRAOPERATIVE CHOLANGIOGRAM;  Surgeon: Krystal Spinner, MD;  Location: [facility];  Service: General;  Laterality: N/A;   CHONDROPLASTY Right 01/12/2024   Procedure: RIGHT KNEE ARTHROSCOPY LATERAL FEMORAL CHONDROPLASTY;  Surgeon: Genelle Standing, MD;  Location: [facility];  Service: Orthopedics;  Laterality: Right;   CYSTOSCOPY WITH RETROGRADE PYELOGRAM, URETEROSCOPY AND STENT PLACEMENT Left 08/10/2006   ESOPHAGOGASTRODUODENOSCOPY (EGD) WITH ESOPHAGEAL DILATION  07/2018   HARDWARE REMOVAL Right 07/01/2016   Procedure: HARDWARE REMOVAL;  Surgeon: Glendia Cordella Hutchinson, MD;  Location: [facility];  Service: Orthopedics;  Laterality: Right;   HARVEST BONE GRAFT Right 01/12/2024   Procedure: RIGHT BONE MARROW ASPIRATE FROM ILIAC CREST DECOMPRESSION AND PLACEMENT RIGHT LATERAL FEMORAL CONDYLE;  Surgeon: Genelle Standing, MD;  Location: [facility];  Service: Orthopedics;  Laterality: Right;   IMPLANTATION, MATRIX-INDUCED AUTOLOGOUS CHONDROCYTES Left 05/24/2024   Procedure: IMPLANTATION, MATRIX-INDUCED AUTOLOGOUS CHONDROCYTES;  Surgeon: Genelle Standing, MD;  Location: [facility];  Service: Orthopedics;  Laterality: Left;  LEFT KNEE MATRIX ASSOCIATED CHONDROCYTE IMPLANTATION   KNEE ARTHROSCOPY Left 05/24/2024   Procedure: ARTHROSCOPY, KNEE;  Surgeon: Genelle Standing, MD;  Location: [facility];  Service: Orthopedics;  Laterality: Left;   KNEE ARTHROSCOPY WITH LATERAL RELEASE Right 12/13/2014   Procedure: KNEE ARTHROSCOPY WITH LATERAL  RELEASE;  Surgeon: LELON JONETTA Shari Mickey., MD;  Location: [facility];  Service: Orthopedics;  Laterality: Right;   LAPAROSCOPIC APPENDECTOMY N/A 06/11/2014   Procedure: APPENDECTOMY LAPAROSCOPIC;  Surgeon: Krystal CHRISTELLA Spinner, MD;  Location[facility];  Service: General;  Laterality: N/A;   ORIF ANKLE FRACTURE Right 01/05/2016   Procedure: OPEN REDUCTION INTERNAL FIXATION (ORIF) ANKLE FRACTURE;  Surgeon: Glendia Cordella Hutchinson, MD;  [facility];  Service: Orthopedics;  Laterality: Right;   OVARIAN CYST SURGERY Right 03/05/2007   AND CHROMOPERTUBATION  VIA LAPAROSCOPY   ROBOTIC ASSISTED LAPAROSCOPIC HYSTERECTOMY AND SALPINGECTOMY Bilateral 08/27/2018   Procedure: XI ROBOTIC ASSISTED LAPAROSCOPIC HYSTERECTOMY AND BILATERAL SALPINGECTOMY;  Surgeon: Flowers Charlie, MD;  Location[facility];  Service: Gynecology;  Laterality: Bilateral;  [facility] for 23hr OBS   SYNOVECTOMY Right 12/13/2014   Procedure: PLICA SYNOVECTOMY;  Surgeon:  LELON JONETTA Shari Mickey., MD;  Location: [facility];  Service: Orthopedics;  Laterality: Right;   TONSILLECTOMY AND ADENOIDECTOMY  child   WISDOM TOOTH EXTRACTION      Family History  Problem Relation Age of Onset   Learning disabilities Mother    Asthma Mother    Arthritis Mother    Irritable bowel syndrome Mother    Miscarriages / [place] Mother    Hypertension Father    Early death Father    Drug abuse Father    Depression Father    Cancer Father    Arthritis Father    Alcohol abuse Father    Brain cancer Father    Anesthesia problems Father        hard to wake up post-op   Lung cancer Father    Hyperlipidemia Maternal Grandmother    Hypertension Maternal Grandmother    Cancer Maternal Grandmother    Asthma Maternal Grandmother    Arthritis Maternal Grandmother    Breast cancer Maternal Grandmother    Ovarian cancer Maternal Grandmother    Diabetes Maternal Grandmother    Heart disease Maternal Grandmother    Heart attack Maternal Grandmother     Hypertension Maternal Grandfather    Hyperlipidemia Maternal Grandfather    Arthritis Maternal Grandfather    Alcohol abuse Maternal Grandfather    Cancer Maternal Grandfather    Prostate cancer Maternal Grandfather    Melanoma Maternal Grandfather    Cancer - Prostate Maternal Grandfather    Hypertension Paternal Grandmother    COPD Paternal Grandmother    Arthritis Paternal Grandmother    Learning disabilities Daughter    Learning disabilities Son    Colon cancer Neg Hx     Allergies  Allergen Reactions   Droperidol  Other (See Comments)    Jittery and restlessness   Metoclopramide  Other (See Comments)    Reglan    Nickel Rash    any jewelry that's not real/ rash and itching    Prochlorperazine Other (See Comments)   Nsaids Other (See Comments)    DUE TO HISTORY OF GASTRIC ULCER   Tolmetin Itching and Other (See Comments)    DUE TO HISTORY OF GASTRIC ULCER  DUE TO HISTORY OF GASTRIC ULCER  DUE TO HISTORY OF GASTRIC ULCER, DUE TO HISTORY OF GASTRIC ULCER   Oxybutynin  Other (See Comments)    severe dry mouth   Adhesive [Tape] Rash   Metoclopramide  Hcl Other (See Comments)    RESTLESSNESS/JITTERY   Prochlorperazine Edisylate Other (See Comments)    RESTLESSNESS/JITTERY    Sulfa Antibiotics Rash    Current Medications:   Current Outpatient Medications:    ADDERALL XR 20 MG 24 hr capsule, Take 20 mg by mouth every morning., Disp: , Rfl: 0   albuterol  (PROVENTIL  HFA;VENTOLIN  HFA) 108 (90 Base) MCG/ACT inhaler, Inhale 2 puffs into the lungs every 6 (six) hours as needed for wheezing or shortness of breath. , Disp: , Rfl:    ALPRAZolam (XANAX) 0.25 MG tablet, SMARTSIG:0.5 Tablet(s) By Mouth PRN, Disp: , Rfl:    atorvastatin  (LIPITOR) 40 MG tablet, TAKE 1 TABLET BY MOUTH EVERY DAY, Disp: 90 tablet, Rfl: 1   Bacillus Coagulans-Inulin (PROBIOTIC) 1-250 BILLION-MG CAPS, , Disp: , Rfl:    cetirizine (ZYRTEC) 10 MG tablet, Take by mouth., Disp: , Rfl:    diclofenac  Sodium  (VOLTAREN ) 1 % GEL, Apply 4 g topically 4 (four) times daily., Disp: 50 g, Rfl: 3   EMGALITY  120 MG/ML SOAJ, Inject 120 mg into  the skin every 30 (thirty) days., Disp: 3 mL, Rfl: 4   estradiol  (VIVELLE -DOT) 0.05 MG/24HR patch, Place 1 patch (0.05 mg total) onto the skin 2 (two) times a week., Disp: 8 patch, Rfl: 12   Fezolinetant  (VEOZAH ) 45 MG TABS, Take 1 tablet (45 mg total) by mouth daily at 6 (six) AM., Disp: 30 tablet, Rfl: 11   Fezolinetant  (VEOZAH ) 45 MG TABS, Take 1 tablet (45 mg total) by mouth daily at 6 (six) AM., Disp: 30 tablet, Rfl: 11   fluticasone  (FLONASE ) 50 MCG/ACT nasal spray, Place 2 sprays into both nostrils daily., Disp: 16 g, Rfl: 6   gabapentin  (NEURONTIN ) 300 MG capsule, TAKE 2 CAPSULES BY MOUTH 3 TIMES DAILY (Patient taking differently: Take 600-1,200 mg by mouth See admin instructions. Take 600mg  by mouth in the morning and 1,200mg  in the evening), Disp: 180 capsule, Rfl: 4   lubiprostone (AMITIZA) 24 MCG capsule, TAKE ONE CAPSULE (24 MCG DOSE) BY MOUTH 2 (TWO) TIMES DAILY WITH MEALS., Disp: 180 capsule, Rfl: 1   magnesium  oxide (MAG-OX) 400 MG tablet, Take by mouth., Disp: , Rfl:    meclizine  (ANTIVERT ) 25 MG tablet, Take 1 tablet (25 mg total) by mouth 3 (three) times daily as needed for dizziness., Disp: 30 tablet, Rfl: 0   MELATONIN GUMMIES PO, Take 1-2 each by mouth at bedtime., Disp: , Rfl:    mirtazapine  (REMERON ) 15 MG tablet, Take 1 tablet (15 mg total) by mouth daily., Disp: 90 tablet, Rfl: 3   Multiple Vitamins-Minerals (EQL CENTURY WOMENS) TABS, , Disp: , Rfl:    NUCYNTA 100 MG TABS, Take 1 tablet by mouth every 8 (eight) hours as needed., Disp: , Rfl:    omeprazole (PRILOSEC) 40 MG capsule, Take 40 mg by mouth daily., Disp: , Rfl:    ondansetron  (ZOFRAN -ODT) 8 MG disintegrating tablet, Take 1 tablet (8 mg total) by mouth every 8 (eight) hours as needed., Disp: 60 tablet, Rfl: 11   PARoxetine  (PAXIL ) 10 MG tablet, Take 1 tablet (10 mg total) by mouth daily.  Replaces Effexor , for hot flushes, switching out will be not feeling good for a few days (Patient taking differently: Take 5 mg by mouth daily. Replaces Effexor , for hot flushes, switching out will be not feeling good for a few days), Disp: 90 tablet, Rfl: 4   progesterone  (PROMETRIUM ) 100 MG capsule, Take 3 capsules (300 mg total) by mouth daily., Disp: 270 capsule, Rfl: 4   promethazine  (PHENERGAN ) 25 MG tablet, Take 1 tablet (25 mg total) by mouth every 8 (eight) hours as needed for nausea or vomiting., Disp: 10 tablet, Rfl: 0   senna-docusate (SENOKOT-S) 8.6-50 MG tablet, Take 1 tablet by mouth daily., Disp: 10 tablet, Rfl: 0   tiZANidine  (ZANAFLEX ) 4 MG tablet, Take 4 mg by mouth every 6 (six) hours as needed for muscle spasms. , Disp: , Rfl:    topiramate  (TOPAMAX ) 25 MG tablet, Take 25 mg by mouth daily., Disp: , Rfl:    traZODone (DESYREL) 50 MG tablet, Take 1-2 tablets by mouth at bedtime as needed., Disp: , Rfl:    triamcinolone  cream (KENALOG ) 0.1 %, APPLY TO AFFECTED AREA TWICE A DAY, Disp: 30 g, Rfl: 0   venlafaxine  XR (EFFEXOR -XR) 150 MG 24 hr capsule, TAKE 2 CAPS BY MOUTH DAILY, Disp: 180 capsule, Rfl: 1   zolmitriptan  (ZOMIG ) 5 MG tablet, Take 1 tablet (5 mg total) by mouth as needed for migraine., Disp: 10 tablet, Rfl: 3   zonisamide (ZONEGRAN) 100 MG capsule,  Take 100 mg by mouth at bedtime., Disp: , Rfl:    tirzepatide  (ZEPBOUND ) 2.5 MG/0.5ML Pen, Inject 2.5 mg into the skin once a week. (Patient not taking: Reported on 08/31/2024), Disp: 2 mL, Rfl: 11  Current Facility-Administered Medications:    Erenumab-aooe SOAJ 140 mg, 140 mg, Subcutaneous, Once,    Review of Systems:   Negative unless otherwise specified per HPI.  Vitals:   Vitals:   08/31/24 0948  BP: 102/70  Pulse: 94  Temp: (!) 96.8 F (36 C)  TempSrc: Temporal  SpO2: 96%  Weight: 253 lb 8 oz (115 kg)  Height: 5' 6 (1.676 m)     Body mass index is 40.92 kg/m.  Physical Exam:   Physical  Exam Constitutional:      Appearance: Normal appearance. She is well-developed.  HENT:     Head: Normocephalic and atraumatic.  Eyes:     General: Lids are normal.     Extraocular Movements: Extraocular movements intact.     Conjunctiva/sclera: Conjunctivae normal.  Pulmonary:     Effort: Pulmonary effort is normal.  Musculoskeletal:        General: Normal range of motion.     Cervical back: Normal range of motion and neck supple.  Lymphadenopathy:     Cervical: Cervical adenopathy present.     Right cervical: Superficial cervical adenopathy present.  Skin:    General: Skin is warm and dry.     Comments: Diffuse maculopapular erythematous rash on torso and arms  Posterior left ear with circular lesion with superficial skin loss and scant clear discharge  Neurological:     Mental Status: She is alert and oriented to person, place, and time.  Psychiatric:        Attention and Perception: Attention and perception normal.        Mood and Affect: Mood normal.        Behavior: Behavior normal.        Thought Content: Thought content normal.        Judgment: Judgment normal.         Assessment and Plan:   Assessment and Plan    Rash and nonspecific skin eruption Advised as follows: Continue allegra Start pepcid   Replace benadryl  with hydroxyzine Depomedrol injection today Start oral prednisone  tomorrow I've also alerted the provider who did blood patch and Primary Care Provider (PCP) and both are in agreement to plan Small amount of contrast is used for the blood patch procedure -- will add contrast to allergies out of abundance of caution Consider referral to allergist for further evaluation if persists/returns Recommend emergency room evaluation if worsens No obvious anaphylaxis or airway involvement on my exam  Lymphadenopathy Unclear etiology Recommend close follow up with Primary Care Provider (PCP) if no improvement in 4 weeks, then likely pursue blood  work/imaging if indicated  Skin lesion of left ear Unclear etiology Provided bacitracin samples-- recommend daily gentle cleansing and application of ointment with bandage Follow up if worsening/lack of improvement  I personally spent a total of 56 minutes in the care of the patient today including preparing to see the patient, getting/reviewing separately obtained history, performing a medically appropriate exam/evaluation, placing orders, documenting clinical information in the EHR, and coordinating care and communicating with neurosurgeon and Primary Care Provider (PCP).    Lucie Buttner, PA-C

## 2024-09-01 ENCOUNTER — Telehealth: Payer: Self-pay

## 2024-09-01 ENCOUNTER — Emergency Department (HOSPITAL_COMMUNITY)
Admission: EM | Admit: 2024-09-01 | Discharge: 2024-09-01 | Disposition: A | Discharge status: Home / Self Care | Attending: Emergency Medicine | Admitting: Emergency Medicine

## 2024-09-01 ENCOUNTER — Other Ambulatory Visit: Payer: Self-pay

## 2024-09-01 ENCOUNTER — Encounter (HOSPITAL_COMMUNITY): Payer: Self-pay

## 2024-09-01 DIAGNOSIS — L500 Allergic urticaria: Secondary | ICD-10-CM | POA: Diagnosis not present

## 2024-09-01 DIAGNOSIS — L509 Urticaria, unspecified: Secondary | ICD-10-CM | POA: Diagnosis present

## 2024-09-01 DIAGNOSIS — R58 Hemorrhage, not elsewhere classified: Secondary | ICD-10-CM

## 2024-09-01 DIAGNOSIS — H6192 Disorder of left external ear, unspecified: Secondary | ICD-10-CM

## 2024-09-01 LAB — CBC WITH DIFFERENTIAL/PLATELET
Abs Immature Granulocytes: 0.04 K/uL (ref 0.00–0.07)
Basophils Absolute: 0 K/uL (ref 0.0–0.1)
Basophils Relative: 0 %
Eosinophils Absolute: 0 K/uL (ref 0.0–0.5)
Eosinophils Relative: 0 %
HCT: 38.4 % (ref 36.0–46.0)
Hemoglobin: 12.3 g/dL (ref 12.0–15.0)
Immature Granulocytes: 0 %
Lymphocytes Relative: 6 %
Lymphs Abs: 0.6 K/uL — ABNORMAL LOW (ref 0.7–4.0)
MCH: 27.5 pg (ref 26.0–34.0)
MCHC: 32 g/dL (ref 30.0–36.0)
MCV: 85.9 fL (ref 80.0–100.0)
Monocytes Absolute: 0.4 K/uL (ref 0.1–1.0)
Monocytes Relative: 4 %
Neutro Abs: 9.1 K/uL — ABNORMAL HIGH (ref 1.7–7.7)
Neutrophils Relative %: 90 %
Platelets: 310 K/uL (ref 150–400)
RBC: 4.47 MIL/uL (ref 3.87–5.11)
RDW: 14.3 % (ref 11.5–15.5)
WBC: 10.1 K/uL (ref 4.0–10.5)
nRBC: 0 % (ref 0.0–0.2)

## 2024-09-01 LAB — BASIC METABOLIC PANEL WITH GFR
Anion gap: 12 (ref 5–15)
BUN: 9 mg/dL (ref 6–20)
CO2: 18 mmol/L — ABNORMAL LOW (ref 22–32)
Calcium: 9.3 mg/dL (ref 8.9–10.3)
Chloride: 108 mmol/L (ref 98–111)
Creatinine, Ser: 1.01 mg/dL — ABNORMAL HIGH (ref 0.44–1.00)
GFR, Estimated: >60 mL/min (ref >60)
Glucose, Bld: 143 mg/dL — ABNORMAL HIGH (ref 70–99)
Potassium: 3.7 mmol/L (ref 3.5–5.1)
Sodium: 138 mmol/L (ref 135–145)

## 2024-09-01 MED ORDER — METHYLPREDNISOLONE SODIUM SUCC 125 MG IJ SOLR
125.0000 mg | Freq: Once | INTRAMUSCULAR | Status: AC
  Administered 2024-09-01: 125 mg via INTRAVENOUS
  Filled 2024-09-01: qty 2

## 2024-09-01 MED ORDER — DIPHENHYDRAMINE HCL 50 MG/ML IJ SOLN
50.0000 mg | Freq: Once | INTRAMUSCULAR | Status: AC
  Administered 2024-09-01: 50 mg via INTRAVENOUS
  Filled 2024-09-01: qty 1

## 2024-09-01 MED ORDER — SODIUM CHLORIDE 0.9 % IV BOLUS
1000.0000 mL | Freq: Once | INTRAVENOUS | Status: AC
  Administered 2024-09-01: 1000 mL via INTRAVENOUS

## 2024-09-01 MED ORDER — LORAZEPAM 2 MG/ML IJ SOLN
1.0000 mg | Freq: Once | INTRAMUSCULAR | Status: AC
  Administered 2024-09-01: 1 mg via INTRAVENOUS
  Filled 2024-09-01: qty 1

## 2024-09-01 NOTE — ED Triage Notes (Signed)
 Pt states she had a blood patch done on Tuesday and states she has been itching since and having SHOB today. Pt states benadryl  and prednisone  did not work. Pt able to talk in complete sentences. Axox4.

## 2024-09-01 NOTE — Telephone Encounter (Signed)
 Patient called for medical advise related to recent development of Urticaria.   Patient was seen and discharged from the ED at [facility] yesterday evening.   She endorses has new symptoms of swelling of the tongue, diffuse rash over her body and now is having shortness of breath.   Patient was informed to go straight to the ER for evaluation due to respiratory symptoms and swelling of the tongue. Patient indicates that she understands that it is recommended that she goes to the ER.   I provided verbal support and told the patient to let me know if I could help her in anyway.

## 2024-09-01 NOTE — Discharge Instructions (Addendum)
 Return for any problem.  Continue prednisone  as previously prescribed.  You do not need additional prednisone  today.  Use Benadryl , hydroxyzine for itching.  Your rash should resolve over the next 3 to 4 days.

## 2024-09-01 NOTE — ED Notes (Signed)
 Patient Alert and oriented to baseline. Stable and ambulatory to baseline. Patient verbalized understanding of the discharge instructions.  Patient belongings were taken by the patient.

## 2024-09-01 NOTE — Telephone Encounter (Signed)
 Called patient and let her know I sent in referral, patient stated she had this done already. States the rash has gotten worst and she is going to call Neuro Surgeon and go to whatever hospital he recommends.

## 2024-09-01 NOTE — ED Provider Notes (Signed)
 [employer] EMERGENCY DEPARTMENT AT [facility] Provider Note   CSN: [id number] Arrival date & time: 09/01/24  1107     Patient presents with: Allergic Reaction   Diane [name] is a 43 y.o. female.   43 year old female with prior medical history as detailed below presents for evaluation.  Patient was seen at [facility] ED recently for same complaint.  Patient reports that last week she had a LP.  She ended up getting a blood patch on Tuesday -48 hours ago.  Subsequently she has developed a diffuse urticarial rash.  She complains of itchy rash across her torso, back, extremities.  She was started on prednisone  for treatment of presumed urticaria.  She took 20 mg yesterday.  She took 20 mg this morning.  She complains of continued rash and itching.  She has used Benadryl  and hydroxyzine without improvement in symptoms.  She denies oral or mucosal lesions.  She denies chest pain or shortness of breath.  She denies fever.  She denies headache.  She denies back pain.  The history is provided by the patient and medical records.       Prior to Admission medications   Medication Sig Start Date End Date Taking? Authorizing Provider  ADDERALL XR 20 MG 24 hr capsule Take 20 mg by mouth every morning. 07/09/18   [provider]  albuterol  (PROVENTIL  HFA;VENTOLIN  HFA) 108 (90 Base) MCG/ACT inhaler Inhale 2 puffs into the lungs every 6 (six) hours as needed for wheezing or shortness of breath.     [provider]  ALPRAZolam (XANAX) 0.5 MG tablet Take 1 tablet (0.5 mg total) by mouth 3 (three) times daily as needed for up to 12 doses for anxiety. 08/31/24   Randol Simmonds, MD  atorvastatin  (LIPITOR) 40 MG tablet TAKE 1 TABLET BY MOUTH EVERY DAY 06/03/24   Jesus Bernardino MATSU, MD  Bacillus Coagulans-Inulin (PROBIOTIC) 1-250 BILLION-MG CAPS     [provider]  cetirizine (ZYRTEC) 10 MG tablet Take by mouth. 06/16/17   [provider]  diclofenac  Sodium  (VOLTAREN ) 1 % GEL Apply 4 g topically 4 (four) times daily. 07/14/24   Genelle Standing, MD  EMGALITY  120 MG/ML SOAJ Inject 120 mg into the skin every 30 (thirty) days. 06/17/24   Jesus Bernardino MATSU, MD  estradiol  (VIVELLE -DOT) 0.05 MG/24HR patch Place 1 patch (0.05 mg total) onto the skin 2 (two) times a week. 02/04/24   Jesus Bernardino MATSU, MD  Fezolinetant  (VEOZAH ) 45 MG TABS Take 1 tablet (45 mg total) by mouth daily at 6 (six) AM. 08/10/24   Jesus Bernardino MATSU, MD  Fezolinetant  (VEOZAH ) 45 MG TABS Take 1 tablet (45 mg total) by mouth daily at 6 (six) AM. 08/12/24   Jesus Bernardino MATSU, MD  fluticasone  (FLONASE ) 50 MCG/ACT nasal spray Place 2 sprays into both nostrils daily. 09/09/23   Jesus Bernardino MATSU, MD  gabapentin  (NEURONTIN ) 300 MG capsule TAKE 2 CAPSULES BY MOUTH 3 TIMES DAILY Patient taking differently: Take 600-1,200 mg by mouth See admin instructions. Take 600mg  by mouth in the morning and 1,200mg  in the evening 05/26/17   Johnny Garnette LABOR, MD  hydrOXYzine (ATARAX) 50 MG tablet Take 1 tablet (50 mg total) by mouth 3 (three) times daily as needed. 08/31/24   Job Lukes, PA  lubiprostone (AMITIZA) 24 MCG capsule TAKE ONE CAPSULE (24 MCG DOSE) BY MOUTH 2 (TWO) TIMES DAILY WITH MEALS. 05/18/24   Jesus Bernardino MATSU, MD  magnesium  oxide (MAG-OX) 400 MG tablet Take by  mouth. 06/16/17   [provider]  meclizine  (ANTIVERT ) 25 MG tablet Take 1 tablet (25 mg total) by mouth 3 (three) times daily as needed for dizziness. 07/30/24   [name], PA-C  MELATONIN GUMMIES PO Take 1-2 each by mouth at bedtime.    [provider]  mirtazapine  (REMERON ) 15 MG tablet Take 1 tablet (15 mg total) by mouth daily. 06/17/24   Jesus Bernardino MATSU, MD  Multiple Vitamins-Minerals [facility] WOMENS) TABS     [provider]  NUCYNTA 100 MG TABS Take 1 tablet by mouth every 8 (eight) hours as needed.    [provider]  omeprazole (PRILOSEC) 40 MG capsule Take 40 mg by mouth daily. 06/06/21    [provider]  ondansetron  (ZOFRAN -ODT) 8 MG disintegrating tablet Take 1 tablet (8 mg total) by mouth every 8 (eight) hours as needed. 06/17/24   Jesus Bernardino MATSU, MD  PARoxetine  (PAXIL ) 10 MG tablet Take 1 tablet (10 mg total) by mouth daily. Replaces Effexor , for hot flushes, switching out will be not feeling good for a few days Patient taking differently: Take 5 mg by mouth daily. Replaces Effexor , for hot flushes, switching out will be not feeling good for a few days 06/19/24   Jesus Bernardino MATSU, MD  predniSONE  (DELTASONE ) 10 MG tablet Take two tablets daily x 1 week, then one tablet daily x 1 week. 08/31/24   Job Lukes, PA  progesterone  (PROMETRIUM ) 100 MG capsule Take 3 capsules (300 mg total) by mouth daily. 06/17/24   Jesus Bernardino MATSU, MD  promethazine  (PHENERGAN ) 25 MG tablet Take 1 tablet (25 mg total) by mouth every 8 (eight) hours as needed for nausea or vomiting. 08/14/17   Gladis Elsie BROCKS, PA-C  senna-docusate (SENOKOT-S) 8.6-50 MG tablet Take 1 tablet by mouth daily. 06/20/23   Prosperi, Christian H, PA-C  tirzepatide  (ZEPBOUND ) 2.5 MG/0.5ML Pen Inject 2.5 mg into the skin once a week. Patient not taking: Reported on 08/31/2024 08/25/24   Jesus Bernardino MATSU, MD  tiZANidine  (ZANAFLEX ) 4 MG tablet Take 4 mg by mouth every 6 (six) hours as needed for muscle spasms.  11/18/16   [provider]  topiramate  (TOPAMAX ) 25 MG tablet Take 25 mg by mouth daily. 07/12/24   [provider]  traZODone (DESYREL) 50 MG tablet Take 1-2 tablets by mouth at bedtime as needed. 02/11/24   [provider]  triamcinolone  cream (KENALOG ) 0.1 % APPLY TO AFFECTED AREA TWICE A DAY 08/02/24   Jesus Bernardino MATSU, MD  venlafaxine  XR (EFFEXOR -XR) 150 MG 24 hr capsule TAKE 2 CAPS BY MOUTH DAILY 08/30/24   Jesus Bernardino MATSU, MD  zolmitriptan  (ZOMIG ) 5 MG tablet Take 1 tablet (5 mg total) by mouth as needed for migraine. 08/11/24   Camara, Amadou, MD  zonisamide (ZONEGRAN) 100 MG capsule Take  100 mg by mouth at bedtime. 07/18/22   [provider]    Allergies: Droperidol , Iodinated contrast media, Metoclopramide , Nickel, Prochlorperazine, Nsaids, Tolmetin, Oxybutynin , Adhesive [tape], Metoclopramide  hcl, Prochlorperazine edisylate, and Sulfa antibiotics    Review of Systems  All other systems reviewed and are negative.   Updated Vital Signs BP (!) 138/102 (BP Location: Right Arm)   Pulse (!) 118   Temp 98.2 F (36.8 C) (Oral)   Resp (!) 22   LMP 08/26/2018   SpO2 98%   Physical Exam Vitals and nursing note reviewed.  Constitutional:      General: She is not in acute distress.    Appearance:  Normal appearance. She is well-developed.  HENT:     Head: Normocephalic and atraumatic.     Mouth/Throat:     Comments: Oropharynx is without mucosal lesions, no edema noted, uvula midline, normal speech, no stridor, no difficulty handling her own secretions. Eyes:     Conjunctiva/sclera: Conjunctivae normal.     Pupils: Pupils are equal, round, and reactive to light.  Cardiovascular:     Rate and Rhythm: Normal rate and regular rhythm.     Heart sounds: Normal heart sounds.  Pulmonary:     Effort: Pulmonary effort is normal. No respiratory distress.     Breath sounds: Normal breath sounds.  Abdominal:     General: There is no distension.     Palpations: Abdomen is soft.     Tenderness: There is no abdominal tenderness.  Musculoskeletal:        General: No deformity. Normal range of motion.     Cervical back: Normal range of motion and neck supple.  Skin:    General: Skin is warm and dry.     Comments: Diffuse urticarial rash/drug reaction rash present.  No mucosal involvement.  Neurological:     General: No focal deficit present.     Mental Status: She is alert and oriented to person, place, and time.     (all labs ordered are listed, but only abnormal results are displayed) Labs Reviewed  CBC WITH DIFFERENTIAL/PLATELET  BASIC METABOLIC PANEL WITH GFR     EKG: None  Radiology: No results found.   Procedures   Medications Ordered in the ED  diphenhydrAMINE  (BENADRYL ) injection 50 mg (has no administration in time range)  methylPREDNISolone  sodium succinate (SOLU-MEDROL ) 125 mg/2 mL injection 125 mg (has no administration in time range)                                    Medical Decision Making Patient with urticaria/drug reaction rash.  Additional steroids, anti-histamines, IVF given.   Ativan  provided most symptomatic relief.  Patient is appropriate for discharge. Importance of close FU stressed. Strict return precautions given and understood.    Amount and/or Complexity of Data Reviewed Labs: ordered.  Risk Prescription drug management.        Final diagnoses:  Urticaria    ED Discharge Orders     None          Laurice Maude BROCKS, MD 09/02/24 1034

## 2024-09-01 NOTE — ED Triage Notes (Signed)
 Pt c.o worsening itching, burning all over her body. Was seen here yesterday and given rx but it is much worse today. Pt took 2 prednisone  and 1 hydroxyzine today

## 2024-09-01 NOTE — Telephone Encounter (Signed)
 Patient is scheduled to see Dr. Janjua on Friday.

## 2024-09-02 ENCOUNTER — Other Ambulatory Visit

## 2024-09-02 ENCOUNTER — Telehealth: Payer: Self-pay | Admitting: Neurosurgery

## 2024-09-02 ENCOUNTER — Ambulatory Visit: Admitting: Neurosurgery

## 2024-09-02 NOTE — Telephone Encounter (Signed)
 Patient called stating that she spoke with you about a rash and said it has not gotten any better since speaking with you. She requested a call back please.

## 2024-09-05 ENCOUNTER — Inpatient Hospital Stay: Admission: RE | Admit: 2024-09-05 | Source: Ambulatory Visit

## 2024-09-05 NOTE — Progress Notes (Unsigned)
 43 y.o. 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 Married.  PCP: Jesus Bernardino MATSU, MD  Patient's last menstrual period was 08/26/2018.   She reports severe hot flashes for years.  Symptoms started after her hysterectomy.  She has drenching sweats that requires her to change her clothes.  Symptoms happen both during the day and at nighttime.  In addition, she reports recent flare in IIH thought to be due to weight gain. She is planning to start GLP1. She had a recent ED visit for hives on right thigh and inside buttock, following lumbar puncture and blood patch.  She is on prednisone  taper and Atarax.  She has an appointment with the clinical pharmacist this week to review medications.  She has tried several option with PCP including:  Taking 600mg  gabapentin , 1200mg  gabapentin -taken for years Effexor  300mg  QD-taken for years 02/02/2024- Veozah , has taken on and off due to insurance prior authorization; provides most hot flash relief 02/04/2024 -50mcg estardiol patch 06/17/2024- 300mg  prometrium  06/19/2024- Paxil  5mg  every day  02/02/2024: FSH 24, estradiol  276, prolactin 16, DHEA 38  Urine sample provided: Yes  Birth control: hysterectomy Last mammogram: 05/09/13 density c, birads 1 neg Sexually active: Yes   GYN HISTORY: RLH, 2019-AUB endometriosis  OB History  Gravida Para Term Preterm AB Living  2 2 1 1  0 2  SAB IAB Ectopic Multiple Live Births  0 0 0 0 2    # Outcome Date GA Lbr Len/2nd Weight Sex Type Anes PTL Lv  2 Preterm 11/20/11 [redacted]w[redacted]d  5 lb 10.7 oz (2.57 kg) F CS-LTranv Spinal  LIV  1 Term      CS-Unspec   LIV   Past Medical History:  Diagnosis Date   Acute injury of left knee cartilage 05/24/2024   ADD (attention deficit disorder with hyperactivity)    Allergy     Anxiety    Arthritis    Atypical chest pain 07/26/2021    Atypical chest pain     Benign intracranial hypertension 06/16/2008   Qualifier: Diagnosis of   By: Zimonjic, Milica         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 of being hard to wake up post-op   Gastric ulcer 03/16/2015   GERD (gastroesophageal reflux disease)    Headache 07/09/2007   Qualifier: Diagnosis of   By: Tammie MD, Bruce      Replacing diagnoses that were inactivated after the 02/16/23 regulatory import     History of gastric ulcer    History of kidney stones    Hyperlipidemia    IBS (irritable bowel syndrome)    Lower extremity edema 07/26/2021   Lower extremity edema     Migraines    neurologist-  dr c. hagen ([facility] in [place])-- treated with Emgality  injection every 30 days   Mild obstructive sleep apnea    per study in epic 03-21-2017 mild osa, recommendation cpap, mouth appliance, wt loss   Nausea and vomiting 12/03/2016   OAB (overactive bladder) 08/12/2017   Osteoarthritis    right ankle  Osteochondral lesion 09/2016   right ankle   Osteomyelitis of knee region [facility])    Peripheral vascular disease    NERVE DAMAGE RIGHT LEG AND FOOT DUE TO INJURGY.    Plantar fasciitis of left foot    Pleural effusion 12/15/2013   PONV (postoperative nausea and vomiting)    AND HEADACHES   Pseudotumor cerebri    at back head   RUQ pain    S/P dilatation of esophageal stricture 07/2018   Sinusitis 08/12/2018   RESOLVED WITH ANTIOBIOTIC   SUI (stress urinary incontinence, female)    Wears contact lenses    Wears partial dentures    upper   Past Surgical History:  Procedure Laterality Date   ANKLE ARTHROSCOPY Right 10/22/2016   Procedure: ANKLE ARTHROSCOPY;  Surgeon: Donnice JONELLE Fees, DPM;  Location: [facility];  Service: Podiatry;  Laterality: Right;  GENERAL/REG BLOCK   ANKLE ARTHROSCOPY Right 09-15-2017   @[facility]    BLADDER SURGERY  child   x 2 - as a child to stretch bladder   CESAREAN SECTION  12/24/2009   CESAREAN SECTION  11/20/2011   Procedure: CESAREAN SECTION;  Surgeon: Charlie JINNY Flowers, MD;  Location: [facility];  Service: Gynecology;  Laterality: N/A;   CHOLECYSTECTOMY N/A 12/03/2016   Procedure: LAPAROSCOPIC CHOLECYSTECTOMY WITH INTRAOPERATIVE CHOLANGIOGRAM;  Surgeon: Krystal Spinner, MD;  Location: [facility];  Service: General;  Laterality: N/A;   CHONDROPLASTY Right 01/12/2024   Procedure: RIGHT KNEE ARTHROSCOPY LATERAL FEMORAL CHONDROPLASTY;  Surgeon: Genelle Standing, MD;  Location: [facility];  Service: Orthopedics;  Laterality: Right;   CYSTOSCOPY WITH RETROGRADE PYELOGRAM, URETEROSCOPY AND STENT PLACEMENT Left 08/10/2006   ESOPHAGOGASTRODUODENOSCOPY (EGD) WITH ESOPHAGEAL DILATION  07/2018   HARDWARE REMOVAL Right 07/01/2016   Procedure: HARDWARE REMOVAL;  Surgeon: Glendia Cordella Hutchinson, MD;  Location: [facility];  Service: Orthopedics;  Laterality: Right;   HARVEST BONE GRAFT Right 01/12/2024   Procedure: RIGHT BONE MARROW ASPIRATE FROM ILIAC CREST DECOMPRESSION AND PLACEMENT RIGHT LATERAL FEMORAL CONDYLE;  Surgeon: Genelle Standing, MD;  Location: [facility];  Service: Orthopedics;  Laterality: Right;   IMPLANTATION, MATRIX-INDUCED AUTOLOGOUS CHONDROCYTES Left 05/24/2024   Procedure: IMPLANTATION, MATRIX-INDUCED AUTOLOGOUS CHONDROCYTES;  Surgeon: Genelle Standing, MD;  Location: [facility];  Service: Orthopedics;  Laterality: Left;  LEFT KNEE MATRIX ASSOCIATED CHONDROCYTE IMPLANTATION   KNEE ARTHROSCOPY Left 05/24/2024   Procedure: ARTHROSCOPY, KNEE;  Surgeon: Genelle Standing, MD;  Location: [facility];  Service: Orthopedics;  Laterality: Left;   KNEE ARTHROSCOPY WITH LATERAL RELEASE Right 12/13/2014    Procedure: KNEE ARTHROSCOPY WITH LATERAL RELEASE;  Surgeon: LELON JONETTA Shari Mickey., MD;  Location: [facility];  Service: Orthopedics;  Laterality: Right;   LAPAROSCOPIC APPENDECTOMY N/A 06/11/2014   Procedure: APPENDECTOMY LAPAROSCOPIC;  Surgeon: Krystal CHRISTELLA Spinner, MD;  Location: [facility];  Service: General;  Laterality: N/A;   ORIF ANKLE FRACTURE Right 01/05/2016   Procedure: OPEN REDUCTION INTERNAL FIXATION (ORIF) ANKLE FRACTURE;  Surgeon: Glendia Cordella Hutchinson, MD;  Location: [facility];  Service: Orthopedics;  Laterality: Right;   OVARIAN CYST SURGERY Right 03/05/2007   AND CHROMOPERTUBATION  VIA LAPAROSCOPY   ROBOTIC ASSISTED LAPAROSCOPIC HYSTERECTOMY AND SALPINGECTOMY Bilateral 08/27/2018   Procedure: XI ROBOTIC ASSISTED LAPAROSCOPIC HYSTERECTOMY AND BILATERAL SALPINGECTOMY;  Surgeon: Flowers Charlie, MD;  Location: [facility];  Service: Gynecology;  Laterality: Bilateral;  [facility] for 23hr OBS   SYNOVECTOMY Right 12/13/2014   Procedure: PLICA SYNOVECTOMY;  Surgeon: LELON JONETTA Shari Mickey., MD;  Location:  [facility];  Service: Orthopedics;  Laterality: Right;   TONSILLECTOMY AND ADENOIDECTOMY  child   WISDOM TOOTH EXTRACTION     Current Outpatient Medications on File Prior to Visit  Medication Sig Dispense Refill   ADDERALL XR 20 MG 24 hr capsule Take 20 mg by mouth every morning.  0   albuterol  (PROVENTIL  HFA;VENTOLIN  HFA) 108 (90 Base) MCG/ACT inhaler Inhale 2 puffs into the lungs every 6 (six) hours as needed for wheezing or shortness of breath.      ALPRAZolam (XANAX) 0.5 MG tablet Take 1 tablet (0.5 mg total) by mouth 3 (three) times daily as needed for up to 12 doses for anxiety. 12 tablet 0   atorvastatin  (LIPITOR) 40 MG tablet TAKE 1 TABLET BY MOUTH EVERY DAY 90 tablet 1   Bacillus Coagulans-Inulin (PROBIOTIC) 1-250 BILLION-MG CAPS      cetirizine (ZYRTEC) 10 MG tablet Take by mouth.     diclofenac  Sodium (VOLTAREN ) 1 % GEL Apply 4 g topically 4 (four) times daily. 50 g 3   EMGALITY  120  MG/ML SOAJ Inject 120 mg into the skin every 30 (thirty) days. 3 mL 4   Fezolinetant  (VEOZAH ) 45 MG TABS Take 1 tablet (45 mg total) by mouth daily at 6 (six) AM. 30 tablet 11   fluticasone  (FLONASE ) 50 MCG/ACT nasal spray Place 2 sprays into both nostrils daily. 16 g 6   gabapentin  (NEURONTIN ) 300 MG capsule TAKE 2 CAPSULES BY MOUTH 3 TIMES DAILY 180 capsule 4   hydrOXYzine (ATARAX) 50 MG tablet Take 1 tablet (50 mg total) by mouth 3 (three) times daily as needed. 30 tablet 0   lubiprostone (AMITIZA) 24 MCG capsule TAKE ONE CAPSULE (24 MCG DOSE) BY MOUTH 2 (TWO) TIMES DAILY WITH MEALS. 180 capsule 1   magnesium  oxide (MAG-OX) 400 MG tablet Take by mouth.     meclizine  (ANTIVERT ) 25 MG tablet Take 1 tablet (25 mg total) by mouth 3 (three) times daily as needed for dizziness. 30 tablet 0   MELATONIN GUMMIES PO Take 1-2 each by mouth at bedtime.     mirtazapine  (REMERON ) 15 MG tablet Take 1 tablet (15 mg total) by mouth daily. 90 tablet 3   Multiple Vitamins-Minerals ([facility] WOMENS) TABS      NUCYNTA 100 MG TABS Take 1 tablet by mouth every 8 (eight) hours as needed.     omeprazole (PRILOSEC) 40 MG capsule Take 40 mg by mouth daily.     ondansetron  (ZOFRAN -ODT) 8 MG disintegrating tablet Take 1 tablet (8 mg total) by mouth every 8 (eight) hours as needed. 60 tablet 11   predniSONE  (DELTASONE ) 10 MG tablet Take two tablets daily x 1 week, then one tablet daily x 1 week. 21 tablet 0   progesterone  (PROMETRIUM ) 100 MG capsule Take 3 capsules (300 mg total) by mouth daily. 270 capsule 4   promethazine  (PHENERGAN ) 25 MG tablet Take 1 tablet (25 mg total) by mouth every 8 (eight) hours as needed for nausea or vomiting. 10 tablet 0   senna-docusate (SENOKOT-S) 8.6-50 MG tablet Take 1 tablet by mouth daily. 10 tablet 0   tiZANidine  (ZANAFLEX ) 4 MG tablet Take 4 mg by mouth every 6 (six) hours as needed for muscle spasms.      topiramate  (TOPAMAX ) 25 MG tablet Take 25 mg by mouth daily.     traZODone  (DESYREL) 50 MG tablet Take 1-2 tablets by mouth at bedtime as needed.     triamcinolone  cream (KENALOG ) 0.1 %  APPLY TO AFFECTED AREA TWICE A DAY 30 g 0   venlafaxine  XR (EFFEXOR -XR) 150 MG 24 hr capsule TAKE 2 CAPS BY MOUTH DAILY 180 capsule 1   zolmitriptan  (ZOMIG ) 5 MG tablet Take 1 tablet (5 mg total) by mouth as needed for migraine. 10 tablet 3   zonisamide (ZONEGRAN) 100 MG capsule Take 100 mg by mouth at bedtime.     Fezolinetant  (VEOZAH ) 45 MG TABS Take 1 tablet (45 mg total) by mouth daily at 6 (six) AM. 30 tablet 11   tirzepatide  (ZEPBOUND ) 2.5 MG/0.5ML Pen Inject 2.5 mg into the skin once a week. (Patient not taking: Reported on 09/06/2024) 2 mL 11   Current Facility-Administered Medications on File Prior to Visit  Medication Dose Route Frequency Provider Last Rate Last Admin   Erenumab-aooe SOAJ 140 mg  140 mg Subcutaneous Once        Allergies  Allergen Reactions   Droperidol  Other (See Comments)    Jittery and restlessness   Iodinated Contrast Media Rash    Occurred with blood patch Oct 2025   Metoclopramide  Other (See Comments)    Reglan    Nickel Rash    any jewelry that's not real/ rash and itching    Prochlorperazine Other (See Comments)   Nsaids Other (See Comments)    DUE TO HISTORY OF GASTRIC ULCER   Tolmetin Itching and Other (See Comments)    DUE TO HISTORY OF GASTRIC ULCER  DUE TO HISTORY OF GASTRIC ULCER  DUE TO HISTORY OF GASTRIC ULCER, DUE TO HISTORY OF GASTRIC ULCER   Oxybutynin  Other (See Comments)    severe dry mouth   Adhesive [Tape] Rash   Metoclopramide  Hcl Other (See Comments)    RESTLESSNESS/JITTERY   Prednisone  Itching    Patient had itching    Prochlorperazine Edisylate Other (See Comments)    RESTLESSNESS/JITTERY    Sulfa Antibiotics Rash      PE Today's Vitals   09/06/24 1108  BP: 104/62  Pulse: 100  Temp: 98.4 F (36.9 C)  TempSrc: Oral  SpO2: 97%  Weight: 250 lb (113.4 kg)  Height: 5' 6.25 (1.683 m)   Body mass  index is 40.05 kg/m.  Physical Exam Vitals reviewed.  Constitutional:      General: She is not in acute distress.    Appearance: Normal appearance.  HENT:     Head: Normocephalic and atraumatic.     Nose: Nose normal.  Eyes:     Extraocular Movements: Extraocular movements intact.     Conjunctiva/sclera: Conjunctivae normal.  Pulmonary:     Effort: Pulmonary effort is normal.  Musculoskeletal:        General: Normal range of motion.     Cervical back: Normal range of motion.  Neurological:     General: No focal deficit present.     Mental Status: She is alert.  Psychiatric:        Mood and Affect: Mood normal.        Behavior: Behavior normal.      Assessment and Plan:        Hot flashes  Normal hormonal labs 2025 Patient s/p benign RLH in 2019 Patient started experiencing severe hot flashes a few years ago.   She had already been prescribed gabapentin  and Effexor  when her hot flashes began for other conditions. She has since been started on Veozah , HRT (E+P), and Paxil .  We reviewed today that she is on 1st and 2nd line therapies for hormone replacement therapy with minimal  relief.  Therefore, her hot flashes may be a medication side effect or a drug interaction.  An extensive review was done of her medications and the following was found:  Medications that can cause hot flashes, flushing or diaphoresis: Xanax  Zomig  Adderrall Topamax  Trazadone in combination zofran , zanaflex , mirtazpine, topirmates > will cause sweating  2. Medications that could cause flushing or diaphoresis: Antivert  Zanaflex   Additionally, we discussed that Veozah  cannot be taken with estradiol .  Given normal estrogen levels this year and greater reported symptom relief with Veozah , I recommend that she stop estradiol .  She can continue to take Prometrium  and Veozah  for the treatment of hot flashes, as these do not share any interactions.  I would recommend that she stop Paxil , as she is  likely getting any improvement from this class of medications from Effexor .  She will continue Effexor  and gabapentin  as these have been longstanding medications of hers.  We reviewed that these are nonhormonal therapies for treatment of hot flashes.  She would benefit greatly from a clinical pharmacology consultation to review the additional medications listed above, as I am not in a position to recommend alternatives of replacements for these medications and they could be contributing to her hot flashes.  Proceed as scheduled on 09/09/24.  Patient is considering discontinuing trazodone, Topamax , Xanax and she rarely uses Zanaflex .  She will discuss this further with appropriate providers.  Follow-up in 3 months for annual exam.  We will reassess symptoms at that time.  45 minutes  total time was spent for this patient encounter, including preparation, face-to-face counseling with the patient, coordination of care, and documentation of the encounter.   Vera LULLA Pa, MD

## 2024-09-06 ENCOUNTER — Encounter: Payer: Self-pay | Admitting: Obstetrics and Gynecology

## 2024-09-06 ENCOUNTER — Ambulatory Visit: Admitting: Obstetrics and Gynecology

## 2024-09-06 VITALS — BP 104/62 | HR 100 | Temp 98.4°F | Ht 66.25 in | Wt 250.0 lb

## 2024-09-06 DIAGNOSIS — R232 Flushing: Secondary | ICD-10-CM

## 2024-09-06 DIAGNOSIS — N951 Menopausal and female climacteric states: Secondary | ICD-10-CM

## 2024-09-09 ENCOUNTER — Other Ambulatory Visit: Payer: Self-pay | Admitting: Pharmacist

## 2024-09-09 ENCOUNTER — Ambulatory Visit: Admitting: Neurosurgery

## 2024-09-09 ENCOUNTER — Encounter: Payer: Self-pay | Admitting: Neurosurgery

## 2024-09-09 ENCOUNTER — Telehealth: Payer: Self-pay | Admitting: Pharmacist

## 2024-09-09 VITALS — BP 130/87 | HR 103 | Temp 99.0°F | Ht 67.0 in | Wt 247.6 lb

## 2024-09-09 DIAGNOSIS — G932 Benign intracranial hypertension: Secondary | ICD-10-CM

## 2024-09-09 MED ORDER — TIRZEPATIDE-WEIGHT MANAGEMENT 2.5 MG/0.5ML ~~LOC~~ SOAJ: 2.5000 mg | SUBCUTANEOUS | 11 refills | Status: DC

## 2024-09-09 NOTE — Progress Notes (Signed)
 43 year old lady with idiopathic intracranial hypertension suspicion who underwent lumbar puncture which demonstrated opening pressure of 30 cm.  She felt that after this the pressure in her head got better.  Unfortunately the pressure has returned as expected.  She unfortunately needed a blood patch.  After the blood patch, she has had a pretty significant rash all over her body and I suspect that she has developed a contrast allergy .  She has taken ointments and medications and hopefully over time this is going to get better.  It is noted in her chart that she had this allergy .  I went over the options with her.  Because she has a sulfa allergy , she cannot have Diamox.  She was started on Topamax  but this has to be weaned off because they suspect that her hot flashes are from this.  She is currently on 100 mg of zonisamide and I recommended that we go up to 200 mg.  I went over the interventional options with her and explained to her what these entail in layman's terms.  This conversation was recorded by her mom on her phone.  I explained to them what an endovascular treatment that this stent entails as well as what a shunt entails and went over the risks and benefits and peculiarities of these treatments with them.  For now, we will try doing a milligrams of zonisamide and see how she does thereafter.

## 2024-09-09 NOTE — Telephone Encounter (Signed)
 Attempt was made to contact patient by phone today for an initial visit with the Clinical Pharmacist regarding polypharmacy and medication adherence and access.  I tried calling patient 3 times on office phone at [facility] but call would not go thru - received message. call could not be completed as dialed x 3.  I then tried to call using J[employer]. Call did go thru this time but unable to reach patient. LM on VM with my contact number [phone].

## 2024-09-12 ENCOUNTER — Encounter: Payer: Self-pay | Admitting: Internal Medicine

## 2024-09-13 ENCOUNTER — Other Ambulatory Visit (HOSPITAL_COMMUNITY): Payer: Self-pay

## 2024-09-13 NOTE — Telephone Encounter (Signed)
 Review and advise if can place referral for patient thanks

## 2024-09-14 ENCOUNTER — Other Ambulatory Visit: Payer: Self-pay

## 2024-09-14 DIAGNOSIS — L309 Dermatitis, unspecified: Secondary | ICD-10-CM

## 2024-09-14 NOTE — Telephone Encounter (Signed)
 placed

## 2024-09-15 ENCOUNTER — Other Ambulatory Visit (HOSPITAL_BASED_OUTPATIENT_CLINIC_OR_DEPARTMENT_OTHER): Payer: Self-pay

## 2024-09-15 ENCOUNTER — Telehealth: Payer: Self-pay | Admitting: Pharmacist

## 2024-09-15 MED ORDER — AMPHETAMINE-DEXTROAMPHET ER 20 MG PO CP24
20.0000 mg | ORAL_CAPSULE | Freq: Every day | ORAL | 0 refills | Status: DC
  Filled 2024-09-15: qty 30, 30d supply, fill #0

## 2024-09-15 NOTE — Telephone Encounter (Signed)
 [employer] implemented a policy change effective May 17, 2024, to no longer cover Zepbound . Unfortunately, this is a plan exclusion, and an appeal will not overturn this decision.   If clinically appropriate, [employer]  may be an option; however, we do not know the specifics of the patient's plan coverage.

## 2024-09-16 ENCOUNTER — Ambulatory Visit: Admitting: Internal Medicine

## 2024-09-19 ENCOUNTER — Other Ambulatory Visit: Payer: Self-pay | Admitting: Internal Medicine

## 2024-09-19 ENCOUNTER — Encounter: Payer: Self-pay | Admitting: Internal Medicine

## 2024-09-19 ENCOUNTER — Encounter: Payer: Self-pay | Admitting: Radiology

## 2024-09-20 ENCOUNTER — Ambulatory Visit: Admitting: Neurosurgery

## 2024-09-27 ENCOUNTER — Ambulatory Visit: Admitting: Internal Medicine

## 2024-09-28 ENCOUNTER — Telehealth: Payer: Self-pay

## 2024-09-28 NOTE — Progress Notes (Signed)
 Complex Care Management Care Guide Note  09/28/2024 Name: JOHNESHA ACHEAMPONG MRN: [id number] DOB: [birth year]  SABRIYAH WILCHER is a 43 y.o. year old female who is a primary care patient of Jesus Bernardino MATSU, MD and is actively engaged with the care management team. I reached out to Rosina GORMAN Abate by phone today to assist with re-scheduling  with the Pharmacist.  Follow up plan: Unsuccessful telephone outreach attempt made. A HIPAA compliant phone message was left for the patient providing contact information and requesting a return call.  Jeoffrey Buffalo , RMA     [facility]  [facility], [facility] Guide  Direct Dial: [phone]  Website: [email or web address]

## 2024-10-07 ENCOUNTER — Ambulatory Visit: Admitting: Neurosurgery

## 2024-10-07 NOTE — Progress Notes (Signed)
 Complex Care Management Care Guide Note  10/07/2024 Name: DAI APEL MRN: [id number] DOB: September 04, 1981  Diane [name] is a 43 y.o. year old female who is a primary care patient of Jesus Bernardino MATSU, MD and is actively engaged with the care management team. I reached out to Rosina GORMAN Abate by phone today to assist with re-scheduling  with the Pharmacist.  Follow up plan: Unsuccessful telephone outreach attempt made. A HIPAA compliant phone message was left for the patient providing contact information and requesting a return call.  Jeoffrey Buffalo , RMA     [facility]  [facility], [facility] Guide  Direct Dial: [phone]  Website: [email or web address]

## 2024-10-12 ENCOUNTER — Other Ambulatory Visit (HOSPITAL_COMMUNITY): Payer: Self-pay

## 2024-10-12 ENCOUNTER — Other Ambulatory Visit (HOSPITAL_BASED_OUTPATIENT_CLINIC_OR_DEPARTMENT_OTHER): Payer: Self-pay

## 2024-10-12 MED ORDER — NUCYNTA 100 MG PO TABS
100.0000 mg | ORAL_TABLET | Freq: Three times a day (TID) | ORAL | 0 refills | Status: DC | PRN
  Filled 2024-10-12 (×2): qty 90, 30d supply, fill #0

## 2024-10-12 MED ORDER — ALPRAZOLAM 0.25 MG PO TABS
0.2500 mg | ORAL_TABLET | Freq: Every day | ORAL | 0 refills | Status: DC | PRN
  Filled 2024-10-12: qty 20, 20d supply, fill #0

## 2024-10-12 MED ORDER — LISDEXAMFETAMINE DIMESYLATE 30 MG PO CAPS
30.0000 mg | ORAL_CAPSULE | Freq: Every day | ORAL | 0 refills | Status: DC
  Filled 2024-10-12: qty 30, 30d supply, fill #0

## 2024-10-17 ENCOUNTER — Other Ambulatory Visit (HOSPITAL_BASED_OUTPATIENT_CLINIC_OR_DEPARTMENT_OTHER): Payer: Self-pay

## 2024-10-17 MED ORDER — CEFDINIR 300 MG PO CAPS
600.0000 mg | ORAL_CAPSULE | Freq: Every day | ORAL | 0 refills | Status: AC
  Filled 2024-10-17: qty 20, 10d supply, fill #0

## 2024-10-17 MED ORDER — FLUCONAZOLE 150 MG PO TABS
150.0000 mg | ORAL_TABLET | ORAL | 0 refills | Status: DC
  Filled 2024-10-17: qty 3, 9d supply, fill #0

## 2024-10-18 ENCOUNTER — Ambulatory Visit: Payer: Self-pay

## 2024-10-18 ENCOUNTER — Ambulatory Visit: Admitting: Neurosurgery

## 2024-10-18 NOTE — Telephone Encounter (Signed)
 No openings at PCP office--Patient scheduled at [facility] Patient states she will get a mask to wear when she gets to the office  FYI Only or Action Required?: FYI only for provider: appointment scheduled on 10/19/2024 at 8:30am with Dr Wolm Scarlet.  Patient was last seen in primary care on 08/31/2024 by Job Lukes, PA.  Called Nurse Triage reporting Diarrhea.  Symptoms began yesterday.  Interventions attempted: OTC medications: Tylenol .  Symptoms are: gradually worsening.  Triage Disposition: See Physician Within 24 Hours  Patient/caregiver understands and will follow disposition?: Yes                 Copied from CRM [id number]. Topic: Clinical - Red Word Triage >> Oct 18, 2024  1:25 PM Shereese L wrote: [facility] that prompted transfer to Nurse Triage: Patient has flu like symptoms , throwing up, congested, diarrhea Reason for Disposition  Earache  Answer Assessment - Initial Assessment Questions Patient states that she took her son to Urgent Care yesterday and last night patient started having diarrhea & she vomited today. She states that she was seen at the Urgent Care yesterday as well. She was tested for the flu and it came up negative but patient's symptoms did not start until after that visit. Patient also has body aches and chills & congestion and sore throat with pressure/pain in her ears. Mother wanted to see about if she could get Tamiflu after being evaluated by a provider.  No openings at PCP office--Patient scheduled at [facility] Patient states she will get a mask to wear when she gets to the office Patient is advised to call us  back if anything changes or with any further questions/concerns. Patient is advised that if anything worsens to go to the Emergency Room. Patient verbalized understanding.       2. ONSET: When did your flu symptoms start?      Last night (10/17/2024) 3. COUGH: How bad is the cough?       Dry some 4.  RESPIRATORY DISTRESS: Describe your breathing.      denies 5. FEVER: Do you have a fever? If Yes, ask: What is your temperature, how was it measured, and when did it start?     Unknown but patient states she has chills and body aches 6. EXPOSURE: Were you exposed to someone with influenza?       Yes-- son age 26 diagnosed yesterday and he received Tamiflu 7. FLU VACCINE: Did you get a flu shot this year?     yes 9. PREGNANCY: Is there any chance you are pregnant? When was your last menstrual period?     No--hysterectomy 10. OTHER SYMPTOMS: Do you have any other symptoms?  (e.g., runny nose, muscle aches, headache, sore throat)       Abd cramping with diarrhea episodes, sore throat, congestion, body aches  Protocols used: Influenza - [facility]

## 2024-10-18 NOTE — Telephone Encounter (Signed)
 Appt tomorrow

## 2024-10-19 ENCOUNTER — Ambulatory Visit: Admitting: Family Medicine

## 2024-10-19 ENCOUNTER — Telehealth: Payer: Self-pay | Admitting: Neurosurgery

## 2024-10-19 ENCOUNTER — Other Ambulatory Visit: Payer: Self-pay

## 2024-10-19 ENCOUNTER — Encounter: Payer: Self-pay | Admitting: Family Medicine

## 2024-10-19 VITALS — BP 120/78 | HR 88 | Temp 97.6°F | Wt 236.5 lb

## 2024-10-19 DIAGNOSIS — J029 Acute pharyngitis, unspecified: Secondary | ICD-10-CM

## 2024-10-19 DIAGNOSIS — T50905A Adverse effect of unspecified drugs, medicaments and biological substances, initial encounter: Secondary | ICD-10-CM

## 2024-10-19 DIAGNOSIS — K625 Hemorrhage of anus and rectum: Secondary | ICD-10-CM

## 2024-10-19 DIAGNOSIS — K529 Noninfective gastroenteritis and colitis, unspecified: Secondary | ICD-10-CM

## 2024-10-19 DIAGNOSIS — R52 Pain, unspecified: Secondary | ICD-10-CM

## 2024-10-19 DIAGNOSIS — J069 Acute upper respiratory infection, unspecified: Secondary | ICD-10-CM

## 2024-10-19 LAB — POCT INFLUENZA A/B
Influenza A, POC: NEGATIVE
Influenza B, POC: NEGATIVE

## 2024-10-19 LAB — POC COVID19 BINAXNOW: SARS Coronavirus 2 Ag: NEGATIVE

## 2024-10-19 MED ORDER — PROMETHAZINE HCL 25 MG PO TABS: 25.0000 mg | ORAL_TABLET | Freq: Three times a day (TID) | ORAL | 0 refills | Status: DC | PRN

## 2024-10-19 NOTE — Progress Notes (Signed)
 OFFICE VISIT  10/19/2024  CC:  Chief Complaint  Patient presents with   Flu-Like Symptoms    Head, nasal, chest congestion; sore throat, diarrhea, vomiting, body aches/chills. Her son was diagnosed with flu yesterday    Patient is a 43 y.o. female who presents for cough.  HPI: About a week ago she developed nasal congestion and sinus pressure and cough.   The cough resolved but her upper respiratory symptoms persisted so 2 days ago she went to the urgent care and cefdinir  was prescribed for acute sinusitis. She says strep testing was negative. Since that time she has had nausea, vomiting, abdominal pain, and for about 24 hours had urgent loose stools.  Today the loose stools have resolved but she feels a sense of fecal urgency and is only passing some gas.  She does have diffuse body aches.  No rash. There is no blood in the stool but she has had bright red blood on toilet paper when wiping for the last couple of weeks.  No rectal pain.  She has some subjective fever at night. She is taking Tylenol  and can keep most liquids down. Urine is dark yellow.  Her son was diagnosed with influenza yesterday.  ROS as above, plus--> she has had some mild pain in both maxillary sinus regions. No CP, no SOB, no wheezing, no dizziness, no HAs,   No focal weakness, paresthesias, or tremors.  No acute vision or hearing abnormalities.  No dysuria or unusual/new urinary urgency or frequency.  No recent changes in lower legs. No palpitations.    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 of being hard to wake up post-op   Gastric ulcer 03/16/2015   GERD (gastroesophageal reflux disease)    Headache 07/09/2007   Qualifier: Diagnosis of   By: Tammie MD, Bruce      Replacing diagnoses that were inactivated after the 02/16/23 regulatory import     History of gastric ulcer    History of kidney stones    Hyperlipidemia    IBS (irritable bowel syndrome)    Lower extremity edema 07/26/2021   Lower extremity edema     Migraines    neurologist-  dr c. hagen ([facility] in [place])-- treated with Emgality  injection every 30 days   Mild obstructive sleep apnea    per study in epic 03-21-2017 mild osa, recommendation cpap, mouth appliance, wt loss   Nausea and vomiting 12/03/2016   OAB (overactive bladder) 08/12/2017   Osteoarthritis    right ankle   Osteochondral lesion 09/2016   right ankle   Osteomyelitis of knee region [facility])    Peripheral vascular disease    NERVE DAMAGE RIGHT LEG AND FOOT DUE TO INJURGY.    Plantar fasciitis of left  foot    Pleural effusion 12/15/2013   PONV (postoperative nausea and vomiting)    AND HEADACHES   Pseudotumor cerebri    at back head   RUQ pain    S/P dilatation of esophageal stricture 07/2018   Sinusitis 08/12/2018   RESOLVED WITH ANTIOBIOTIC   SUI (stress urinary incontinence, female)    Wears contact lenses    Wears partial dentures    upper    Past Surgical History:  Procedure Laterality Date   ANKLE ARTHROSCOPY Right 10/22/2016   Procedure: ANKLE ARTHROSCOPY;  Surgeon: Donnice JONELLE Fees, DPM;  Location: [facility];  Service: Podiatry;  Laterality: Right;  GENERAL/REG BLOCK   ANKLE ARTHROSCOPY Right 09-15-2017   @[facility]    BLADDER SURGERY  child   x 2 - as a child to stretch bladder   CESAREAN  SECTION  12/24/2009   CESAREAN SECTION  11/20/2011   Procedure: CESAREAN SECTION;  Surgeon: Charlie JINNY Flowers, MD;  Location: [facility];  Service: Gynecology;  Laterality: N/A;   CHOLECYSTECTOMY N/A 12/03/2016   Procedure: LAPAROSCOPIC CHOLECYSTECTOMY WITH INTRAOPERATIVE CHOLANGIOGRAM;  Surgeon: Krystal Spinner, MD;  Location: [facility];  Service: General;  Laterality: N/A;   CHONDROPLASTY Right 01/12/2024   Procedure: RIGHT KNEE ARTHROSCOPY LATERAL FEMORAL CHONDROPLASTY;  Surgeon: Genelle Standing, MD;  Location: [facility];  Service: Orthopedics;  Laterality: Right;   CYSTOSCOPY WITH RETROGRADE PYELOGRAM, URETEROSCOPY AND STENT PLACEMENT Left 08/10/2006   ESOPHAGOGASTRODUODENOSCOPY (EGD) WITH ESOPHAGEAL DILATION  07/2018   HARDWARE REMOVAL Right 07/01/2016   Procedure: HARDWARE REMOVAL;  Surgeon: Glendia Cordella Hutchinson, MD;  Location: [facility];  Service: Orthopedics;  Laterality: Right;   HARVEST BONE GRAFT Right 01/12/2024   Procedure: RIGHT BONE MARROW ASPIRATE FROM ILIAC CREST DECOMPRESSION AND PLACEMENT RIGHT LATERAL FEMORAL CONDYLE;  Surgeon: Genelle Standing, MD;  Location: [facility];  Service: Orthopedics;  Laterality: Right;   IMPLANTATION, MATRIX-INDUCED AUTOLOGOUS CHONDROCYTES Left 05/24/2024   Procedure: IMPLANTATION, MATRIX-INDUCED AUTOLOGOUS CHONDROCYTES;  Surgeon: Genelle Standing, MD;  Location: [facility];  Service: Orthopedics;  Laterality: Left;  LEFT KNEE MATRIX ASSOCIATED CHONDROCYTE IMPLANTATION   KNEE ARTHROSCOPY Left 05/24/2024   Procedure: ARTHROSCOPY, KNEE;  Surgeon: Genelle Standing, MD;  Location: [facility];  Service: Orthopedics;  Laterality: Left;   KNEE ARTHROSCOPY WITH LATERAL RELEASE Right 12/13/2014   Procedure: KNEE ARTHROSCOPY WITH LATERAL RELEASE;  Surgeon: LELON JONETTA Shari Mickey., MD;  Location: [facility];  Service: Orthopedics;  Laterality: Right;   LAPAROSCOPIC APPENDECTOMY N/A 06/11/2014   Procedure: APPENDECTOMY  LAPAROSCOPIC;  Surgeon: Krystal CHRISTELLA Spinner, MD;  Location: [facility];  Service: General;  Laterality: N/A;   ORIF ANKLE FRACTURE Right 01/05/2016   Procedure: OPEN REDUCTION INTERNAL FIXATION (ORIF) ANKLE FRACTURE;  Surgeon: Glendia Cordella Hutchinson, MD;  Location: [facility];  Service: Orthopedics;  Laterality: Right;   OVARIAN CYST SURGERY Right 03/05/2007   AND CHROMOPERTUBATION  VIA LAPAROSCOPY   ROBOTIC ASSISTED LAPAROSCOPIC HYSTERECTOMY AND SALPINGECTOMY Bilateral 08/27/2018   Procedure: XI ROBOTIC ASSISTED LAPAROSCOPIC HYSTERECTOMY AND BILATERAL SALPINGECTOMY;  Surgeon: Flowers Charlie, MD;  Location: [facility];  Service: Gynecology;  Laterality: Bilateral;  [facility] for 23hr OBS   SYNOVECTOMY Right 12/13/2014   Procedure: PLICA SYNOVECTOMY;  Surgeon: LELON JONETTA Shari Mickey., MD;  Location: [facility];  Service: Orthopedics;  Laterality: Right;   TONSILLECTOMY AND ADENOIDECTOMY  child   WISDOM TOOTH EXTRACTION      Outpatient Medications Prior to Visit  Medication Sig Dispense Refill  albuterol  (PROVENTIL  HFA;VENTOLIN  HFA) 108 (90 Base) MCG/ACT inhaler Inhale 2 puffs into the lungs every 6 (six) hours as needed for wheezing or shortness of breath.      ALPRAZolam  (XANAX ) 0.25 MG tablet Take 1 Tablet by mouth daily, as needed, use as little as possible and not after 3pm 20 tablet 0   ALPRAZolam  (XANAX ) 0.5 MG tablet Take 1 tablet (0.5 mg total) by mouth 3 (three) times daily as needed for up to 12 doses for anxiety. 12 tablet 0   amphetamine -dextroamphetamine  (ADDERALL XR) 20 MG 24 hr capsule Take 1 capsule (20 mg total) by mouth daily. 30 capsule 0   atorvastatin  (LIPITOR) 40 MG tablet TAKE 1 TABLET BY MOUTH EVERY DAY 90 tablet 1   Bacillus Coagulans-Inulin (PROBIOTIC) 1-250 BILLION-MG CAPS      cefdinir (OMNICEF) 300 MG capsule Take 2 capsules (600 mg total) by mouth daily for 10 days. 20 capsule 0   cetirizine (ZYRTEC) 10 MG tablet Take by mouth.     diclofenac  Sodium (VOLTAREN ) 1 % GEL Apply 4 g  topically 4 (four) times daily. 50 g 3   EMGALITY  120 MG/ML SOAJ Inject 120 mg into the skin every 30 (thirty) days. 3 mL 4   fluconazole  (DIFLUCAN ) 150 MG tablet Take 1 tablet (150 mg total) by mouth every 3 (three) days for yeast infection. 3 tablet 0   fluticasone  (FLONASE ) 50 MCG/ACT nasal spray Place 2 sprays into both nostrils daily. 16 g 6   gabapentin  (NEURONTIN ) 300 MG capsule TAKE 2 CAPSULES BY MOUTH 3 TIMES DAILY 180 capsule 4   hydrOXYzine  (ATARAX ) 50 MG tablet Take 1 tablet (50 mg total) by mouth 3 (three) times daily as needed. 30 tablet 0   lisdexamfetamine (VYVANSE ) 30 MG capsule Take 1 capsule (30 mg total) by mouth daily. 30 capsule 0   lubiprostone (AMITIZA) 24 MCG capsule TAKE ONE CAPSULE (24 MCG DOSE) BY MOUTH 2 (TWO) TIMES DAILY WITH MEALS. 180 capsule 1   magnesium  oxide (MAG-OX) 400 MG tablet Take by mouth.     meclizine  (ANTIVERT ) 25 MG tablet Take 1 tablet (25 mg total) by mouth 3 (three) times daily as needed for dizziness. 30 tablet 0   MELATONIN GUMMIES PO Take 1-2 each by mouth at bedtime.     Multiple Vitamins-Minerals (EQL CENTURY WOMENS) TABS      NUCYNTA  100 MG TABS Take 1 tablet by mouth every 8 (eight) hours as needed.     omeprazole (PRILOSEC) 40 MG capsule TAKE 1 (ONE) CAPSUL CAPSULE BY MOUTH DAILY 90 capsule 3   ondansetron  (ZOFRAN -ODT) 8 MG disintegrating tablet Take 1 tablet (8 mg total) by mouth every 8 (eight) hours as needed. 60 tablet 11   progesterone  (PROMETRIUM ) 100 MG capsule Take 3 capsules (300 mg total) by mouth daily. 270 capsule 4   senna-docusate (SENOKOT-S) 8.6-50 MG tablet Take 1 tablet by mouth daily. 10 tablet 0   Tapentadol  HCl (NUCYNTA ) 100 MG TABS Take 1 tablet (100 mg total) by mouth every 8 (eight) hours as needed. 90 tablet 0   tiZANidine  (ZANAFLEX ) 4 MG tablet Take 4 mg by mouth every 6 (six) hours as needed for muscle spasms.      traZODone (DESYREL) 50 MG tablet Take 1-2 tablets by mouth at bedtime as needed.     triamcinolone   cream (KENALOG ) 0.1 % APPLY TO AFFECTED AREA TWICE A DAY 30 g 0   venlafaxine  XR (EFFEXOR -XR) 150 MG 24 hr capsule TAKE 2 CAPS BY MOUTH  DAILY 180 capsule 1   zolmitriptan  (ZOMIG ) 5 MG tablet Take 1 tablet (5 mg total) by mouth as needed for migraine. 10 tablet 3   zonisamide (ZONEGRAN) 100 MG capsule Take 200 mg by mouth at bedtime.     predniSONE  (DELTASONE ) 10 MG tablet Take two tablets daily x 1 week, then one tablet daily x 1 week. 21 tablet 0   promethazine  (PHENERGAN ) 25 MG tablet Take 1 tablet (25 mg total) by mouth every 8 (eight) hours as needed for nausea or vomiting. 10 tablet 0   tirzepatide  (ZEPBOUND ) 2.5 MG/0.5ML Pen Inject 2.5 mg into the skin once a week. 2 mL 11   Fezolinetant  (VEOZAH ) 45 MG TABS Take 1 tablet (45 mg total) by mouth daily at 6 (six) AM. (Patient not taking: Reported on 10/19/2024) 30 tablet 11   ADDERALL XR 20 MG 24 hr capsule Take 20 mg by mouth every morning.  0   Fezolinetant  (VEOZAH ) 45 MG TABS Take 1 tablet (45 mg total) by mouth daily at 6 (six) AM. (Patient not taking: Reported on 09/09/2024) 30 tablet 11   mirtazapine  (REMERON ) 15 MG tablet Take 1 tablet (15 mg total) by mouth daily. 90 tablet 3   topiramate  (TOPAMAX ) 25 MG tablet Take 25 mg by mouth daily.     Facility-Administered Medications Prior to Visit  Medication Dose Route Frequency Provider Last Rate Last Admin   Erenumab -aooe SOAJ 140 mg  140 mg Subcutaneous Once         Allergies  Allergen Reactions   Droperidol  Other (See Comments)    Jittery and restlessness   Iodinated Contrast Media Rash    Occurred with blood patch Oct 2025   Metoclopramide  Other (See Comments)    Reglan    Nickel Rash    any jewelry that's not real/ rash and itching    Prochlorperazine Other (See Comments)   Nsaids Other (See Comments)    DUE TO HISTORY OF GASTRIC ULCER   Tolmetin Itching and Other (See Comments)    DUE TO HISTORY OF GASTRIC ULCER  DUE TO HISTORY OF GASTRIC ULCER  DUE TO HISTORY OF  GASTRIC ULCER, DUE TO HISTORY OF GASTRIC ULCER   Oxybutynin  Other (See Comments)    severe dry mouth   Adhesive [Tape] Rash   Metoclopramide  Hcl Other (See Comments)    RESTLESSNESS/JITTERY   Prednisone  Itching    Patient had itching    Prochlorperazine Edisylate Other (See Comments)    RESTLESSNESS/JITTERY    Sulfa Antibiotics Rash    Review of Systems  As per HPI  PE:    10/19/2024    4:08 PM 09/09/2024    1:02 PM 09/06/2024   11:08 AM  Vitals with BMI  Height  5' 7 5' 6.25  Weight 236 lbs 8 oz 247 lbs 10 oz 250 lbs  BMI  38.77 40.04  Systolic 120 130 895  Diastolic 78 87 62  Pulse 88 103 100   02 sat 98% RA  Physical Exam Exam chaperoned by Bobbetta Degree, CMA.  Gen: Alert, well appearing.  Patient is oriented to person, place, time, and situation. AFFECT: pleasant, lucid thought and speech. ENT: Ears: EACs clear, normal epithelium.  TMs with good light reflex and landmarks bilaterally.  Eyes: no injection, icteris, swelling, or exudate.  EOMI, PERRLA. Nose: no drainage or turbinate edema/swelling.  No injection or focal lesion.  Mouth: lips without lesion/swelling.  Oral mucosa pink and moist.  Dentition intact and without obvious caries or gingival  swelling.  Oropharynx without erythema, exudate, or swelling.  CV: RRR, no m/r/g.   LUNGS: CTA bilat, nonlabored resps, good aeration in all lung fields. ABD: soft, mild distention, mod lower and periumbilical TTP w/out guarding or rebound.  Bowel sounds slightly hypoactive. Extremities: No edema.  LABS:    Chemistry      Component Value Date/Time   NA 138 09/01/2024 1155   NA 139 07/26/2021 1053   K 3.7 09/01/2024 1155   CL 108 09/01/2024 1155   CO2 18 (L) 09/01/2024 1155   BUN 9 09/01/2024 1155   BUN 9 07/26/2021 1053   CREATININE 1.01 (H) 09/01/2024 1155      Component Value Date/Time   CALCIUM  9.3 09/01/2024 1155   ALKPHOS 108 06/20/2023 1542   AST 28 06/20/2023 1542   ALT 23 06/20/2023 1542    BILITOT 0.3 06/20/2023 1542   BILITOT 0.2 07/26/2021 1053     Lab Results  Component Value Date   WBC 10.1 09/01/2024   HGB 12.3 09/01/2024   HCT 38.4 09/01/2024   MCV 85.9 09/01/2024   PLT 310 09/01/2024   IMPRESSION AND PLAN:  #1 URI/sinusitis. Most likely viral.  Advised her to stop the cefdinir because this may be causing her recent gastrointestinal symptoms. Encouraged use of saline nasal spray.  2.  Nausea, vomiting, abdominal pain, and diarrhea. She does have mild dehydration. Certainly could be viral gastroenteritis but also question whether this may all be side effect from cefdinir. Stop cefdinir. Promethazine  25 mg tabs, 1 tab every 8 hours as needed, #10 tabs, no refill. Push fluids.  3.  Bright red blood per rectum. She may have internal hemorrhoid bleeding. She will address this with her PCP.  Rapid flu and rapid COVID test negative here today.  An After Visit Summary was printed and given to the patient.  FOLLOW UP: Return if symptoms worsen or fail to improve.  Signed:  Gerlene Hockey, MD           10/19/2024

## 2024-10-19 NOTE — Telephone Encounter (Signed)
 Patient called to rescheduled missed appt from 12/2. She stated she was sick

## 2024-10-20 ENCOUNTER — Telehealth: Payer: Self-pay | Admitting: Internal Medicine

## 2024-10-20 ENCOUNTER — Ambulatory Visit: Payer: Self-pay

## 2024-10-20 DIAGNOSIS — K625 Hemorrhage of anus and rectum: Secondary | ICD-10-CM

## 2024-10-20 NOTE — Telephone Encounter (Signed)
 Returned call to patient No answer  Left VM

## 2024-10-20 NOTE — Telephone Encounter (Signed)
 LVM to possibly triage pt regarding Blood in Stool.

## 2024-10-20 NOTE — Telephone Encounter (Signed)
 Please Advise if GI referral can be ordered

## 2024-10-20 NOTE — Telephone Encounter (Signed)
 FYI Only or Action Required?: Action required by provider: referral request and update on patient condition.  Patient was last seen in primary care on 10/19/2024 by McGowen, Aleene DEL, MD.  Called Nurse Triage reporting Rectal Bleeding.  Symptoms began several days ago.  Interventions attempted: Rest, hydration, or home remedies.  Symptoms are: stable.  Triage Disposition: Call PCP Now (overriding See Physician Within 24 Hours)  Patient/caregiver understands and will follow disposition?: Yes            Copied from CRM #[id number]. Topic: Clinical - Red Word Triage >> Oct 20, 2024  1:54 PM Sasha M wrote: Red Word that prompted transfer to Nurse Triage: Pt has blood in stool for couple weeks, diarrhea, stopped for about 3-4 days and came back once pt got sick with stomach bug Reason for Disposition  MODERATE rectal bleeding (e.g., small blood clots, passing blood without stool, or toilet water turns red)  Answer Assessment - Initial Assessment Questions The patient was seen yesterday in office. She states she spoke with Dr Candise about her rectal bleeding, abdominal pain and vomiting/diarrhea. Per MD's note he states bleeding was just a few drops on toilet tissue which patient denies. He mentions in note for patient to address with PCP due to potential bleeding hemorrhoid. Patient scheduled herself with PCP Jesus, next available not until January. Clinic called and LVM for patient to speak with nurse triage.   1. APPEARANCE of BLOOD: What color is it? Is it passed separately, on the surface of the stool, or mixed in with the stool?      Bright red, turns the water red. Denies blood clots.  2. AMOUNT: How much blood was passed?      More than a few drops on toilet tissue, turns toilet water red.  3. FREQUENCY: How many times has blood been passed with the stools?      Every time she uses the bathroom.  4. ONSET: When was the blood first seen in the stools? (Days or  weeks)      Couple weeks ago, stopped for a few days and then has been daily for the past 4 days.  5. DIARRHEA: Is there also some diarrhea? If Yes, ask: How many diarrhea stools in the past 24 hours?      Yes, 4-5 episodes in past 24 hours.  6. CONSTIPATION: Do you have constipation? If Yes, ask: How bad is it?     Yes, chronic constipation due to IBS. She states she takes senna nightly for it.  7. RECURRENT SYMPTOMS: Have you had blood in your stools before? If Yes, ask: When was the last time? and What happened that time?      Yes, she states she has a history of ulcers and about 10+ years ago had blood in her stool.  8. BLOOD THINNERS: Do you take any blood thinners? (e.g., aspirin , clopidogrel / Plavix, coumadin, heparin ). Notes: Other strong blood thinners include: Arixtra (fondaparinux), Eliquis (apixaban), Pradaxa (dabigatran), and Xarelto  (rivaroxaban ).     No aspirin  or blood thinners.  9. OTHER SYMPTOMS: Do you have any other symptoms?  (e.g., abdomen pain, vomiting, dizziness, fever)     Constant abdominal pain 6/10. Vomiting has resolved (last episode 2 days ago), states she is tolerating PO fluids. She was placed on Omnicef on 10/17/24 and told to stop due to potential for antibiotic to make the diarrhea worse. Dizzy/lightheaded mild (able to stand, walk).  Protocols used: Rectal Bleeding-A-AH

## 2024-10-21 ENCOUNTER — Encounter: Payer: Self-pay | Admitting: Internal Medicine

## 2024-10-21 NOTE — Telephone Encounter (Signed)
 Britt, I entered order to GI but pls send an FYI to Dr. Jesus to just make him aware. thx

## 2024-10-26 NOTE — Telephone Encounter (Signed)
 Given alarm symptoms reported via MyChart messaging, urgent workup initiated upon message review: Stat laboratory studies ordered:  CBC with differential (evaluate for anemia, infection) CMP (evaluate electrolytes, renal function, nutritional status) Amylase/lipase Lactic acid  Patient advised she may present to laboratory today without appointment. Urgent GI referral placed:  Referral submitted with documentation of alarm symptoms Marked as URGENT Clinical indication: Hematochezia with 18 lb unintentional weight loss and inability to eat, duration weeks Patient/family education provided:  Advised that emergency department evaluation remains clinically indicated given symptom severity Provided specific warning signs requiring immediate ED evaluation or 911:  Hemodynamic instability (lightheadedness, syncope, presyncope) Increased bleeding volume Worsening abdominal pain Tachycardia Altered mental status Acknowledged patient's reported barriers to ED care (prior prolonged wait, childcare constraints) Emphasized that ED evaluation becomes medically necessary if symptoms worsen or labs reveal significant anemia Guidance on appropriate care channels provided:  Patient/family advised that urgent clinical concerns are most appropriately communicated via telephone triage to ensure timely response MyChart messaging is designed for non-urgent communication with expected response time of up to 3 business days  FOLLOW-UP PLAN: Will review laboratory results upon completion - STAT If significant anemia identified (Hgb < 10 or significant drop from baseline), will contact patient directly and advise ED evaluation is medically necessary Will follow up on GI referral to ensure urgent scheduling Patient/family advised to contact office via telephone or seek emergency care if any worsening symptoms  TIME SPENT: Approximately 15 minutes spent reviewing MyChart messages, reviewing relevant history,  formulating clinical plan, placing laboratory and referral orders, and composing detailed responses to patient and family member. Greater than 50% of encounter time spent in counseling, coordination of care, and medical decision-making.  ATTESTATION: This patient's symptom presentation was communicated via MyChart secure messaging rather than telephone triage or emergency services. Upon review of messages in inbox queue, urgent clinical action was initiated as documented above. Patient/family educated on appropriate utilization of communication channels for future urgent concerns to facilitate timely response.

## 2024-10-27 ENCOUNTER — Other Ambulatory Visit (INDEPENDENT_AMBULATORY_CARE_PROVIDER_SITE_OTHER)

## 2024-10-27 ENCOUNTER — Other Ambulatory Visit: Payer: Self-pay | Admitting: Internal Medicine

## 2024-10-27 ENCOUNTER — Ambulatory Visit: Payer: Self-pay | Admitting: Internal Medicine

## 2024-10-27 DIAGNOSIS — R634 Abnormal weight loss: Secondary | ICD-10-CM

## 2024-10-27 DIAGNOSIS — R109 Unspecified abdominal pain: Secondary | ICD-10-CM

## 2024-10-27 DIAGNOSIS — K922 Gastrointestinal hemorrhage, unspecified: Secondary | ICD-10-CM

## 2024-10-27 LAB — COMPREHENSIVE METABOLIC PANEL WITH GFR
ALT: 19 U/L (ref 0–35)
AST: 24 U/L (ref 0–37)
Albumin: 4.4 g/dL (ref 3.5–5.2)
Alkaline Phosphatase: 123 U/L — ABNORMAL HIGH (ref 39–117)
BUN: 9 mg/dL (ref 6–23)
CO2: 24 meq/L (ref 19–32)
Calcium: 9.5 mg/dL (ref 8.4–10.5)
Chloride: 102 meq/L (ref 96–112)
Creatinine, Ser: 0.89 mg/dL (ref 0.40–1.20)
GFR: 80.1 mL/min (ref >60.00)
Glucose, Bld: 107 mg/dL — ABNORMAL HIGH (ref 70–99)
Potassium: 3.1 meq/L — ABNORMAL LOW (ref 3.5–5.1)
Sodium: 138 meq/L (ref 135–145)
Total Bilirubin: 0.3 mg/dL (ref 0.2–1.2)
Total Protein: 7.5 g/dL (ref 6.0–8.3)

## 2024-10-27 LAB — CBC WITH DIFFERENTIAL/PLATELET
Basophils Absolute: 0.1 K/uL (ref 0.0–0.1)
Basophils Relative: 1 % (ref 0.0–3.0)
Eosinophils Absolute: 0.2 K/uL (ref 0.0–0.7)
Eosinophils Relative: 2.9 % (ref 0.0–5.0)
HCT: 37.8 % (ref 36.0–46.0)
Hemoglobin: 12.4 g/dL (ref 12.0–15.0)
Lymphocytes Relative: 29.4 % (ref 12.0–46.0)
Lymphs Abs: 1.7 K/uL (ref 0.7–4.0)
MCHC: 32.8 g/dL (ref 30.0–36.0)
MCV: 83.3 fl (ref 78.0–100.0)
Monocytes Absolute: 0.5 K/uL (ref 0.1–1.0)
Monocytes Relative: 7.8 % (ref 3.0–12.0)
Neutro Abs: 3.5 K/uL (ref 1.4–7.7)
Neutrophils Relative %: 58.9 % (ref 43.0–77.0)
Platelets: 247 K/uL (ref 150.0–400.0)
RBC: 4.54 Mil/uL (ref 3.87–5.11)
RDW: 14.1 % (ref 11.5–15.5)
WBC: 5.9 K/uL (ref 4.0–10.5)

## 2024-10-27 LAB — AMYLASE: Amylase: 24 U/L — ABNORMAL LOW (ref 27–131)

## 2024-10-27 LAB — LIPASE: Lipase: 22 U/L (ref 11.0–59.0)

## 2024-10-27 NOTE — Progress Notes (Signed)
 Share to referral team we only need one urgent gastrointestinal referral to go through she has gastrointestinal bleeding and these labs can be added on.

## 2024-10-27 NOTE — Telephone Encounter (Signed)
 Labs reviewed. All good. Was able to get scheduled in am off cancellation.

## 2024-10-28 ENCOUNTER — Ambulatory Visit: Admitting: Internal Medicine

## 2024-10-28 ENCOUNTER — Telehealth: Payer: Self-pay

## 2024-10-28 ENCOUNTER — Other Ambulatory Visit: Payer: Self-pay

## 2024-10-28 ENCOUNTER — Ambulatory Visit

## 2024-10-28 ENCOUNTER — Encounter: Payer: Self-pay | Admitting: Internal Medicine

## 2024-10-28 VITALS — BP 122/80 | HR 90 | Temp 98.0°F | Ht 67.0 in | Wt 230.8 lb

## 2024-10-28 DIAGNOSIS — B3731 Acute candidiasis of vulva and vagina: Secondary | ICD-10-CM | POA: Diagnosis not present

## 2024-10-28 DIAGNOSIS — N951 Menopausal and female climacteric states: Secondary | ICD-10-CM

## 2024-10-28 DIAGNOSIS — R110 Nausea: Secondary | ICD-10-CM | POA: Diagnosis not present

## 2024-10-28 DIAGNOSIS — R1084 Generalized abdominal pain: Secondary | ICD-10-CM

## 2024-10-28 DIAGNOSIS — K922 Gastrointestinal hemorrhage, unspecified: Secondary | ICD-10-CM

## 2024-10-28 DIAGNOSIS — J320 Chronic maxillary sinusitis: Secondary | ICD-10-CM | POA: Diagnosis not present

## 2024-10-28 DIAGNOSIS — Z9889 Other specified postprocedural states: Secondary | ICD-10-CM | POA: Diagnosis not present

## 2024-10-28 MED ORDER — FLUCONAZOLE 150 MG PO TABS: 150.0000 mg | ORAL_TABLET | ORAL | 0 refills | Status: DC

## 2024-10-28 MED ORDER — AMOXICILLIN-POT CLAVULANATE 875-125 MG PO TABS: 1.0000 | ORAL_TABLET | Freq: Two times a day (BID) | ORAL | 0 refills | Status: DC

## 2024-10-28 MED ORDER — ONDANSETRON 8 MG PO TBDP: 8.0000 mg | ORAL_TABLET | Freq: Three times a day (TID) | ORAL | 11 refills | Status: AC | PRN

## 2024-10-28 MED ORDER — GABAPENTIN 300 MG PO CAPS: 600.0000 mg | ORAL_CAPSULE | Freq: Three times a day (TID) | ORAL | 4 refills | Status: AC

## 2024-10-28 MED ORDER — PEG 3350-KCL-NA BICARB-NACL 420 G PO SOLR: 4000.0000 mL | Freq: Once | ORAL | 0 refills | Status: AC

## 2024-10-28 NOTE — Patient Instructions (Addendum)
 It was a pleasure seeing you today! Your health and satisfaction are our top priorities.  Bernardino Cone, MD  VISIT SUMMARY: During your visit, we discussed your recent abdominal pain, weight loss, and gastrointestinal bleeding. We also addressed your sinus infection, menopausal symptoms, and the potential for a yeast infection due to antibiotic use.  YOUR PLAN: -PARTIAL SMALL BOWEL OBSTRUCTION DUE TO ADHESIONS: A partial small bowel obstruction means that your intestines are partially blocked, likely due to scar tissue from previous surgeries. We have ordered a CT scan with contrast and an abdominal x-ray to check for blockages. You will need to do a full bowel prep with 4000 mL of New Lightly before the CT scan. Zofran  is prescribed to help with nausea. You are also referred to surgery for a possible procedure to remove the adhesions and a colonoscopy.  -GASTROINTESTINAL HEMORRHAGE: Gastrointestinal hemorrhage means there is bleeding in your digestive tract, likely due to straining and the partial bowel obstruction. We recommend a full bowel prep to help identify the source of the bleeding. Please monitor for severe bleeding, such as large amounts of blood in your stools, and seek immediate medical attention if this occurs.  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 We have prescribed Augmentin  to treat the sinus infection and medication to prevent a yeast infection from the antibiotics.  -MENOPAUSAL SYMPTOMS: Menopausal symptoms are changes you experience due to reduced hormone levels. You are currently managing these symptoms with Veozah  and progesterone . Continue taking these medications as prescribed.  -VAGINAL CANDIDIASIS: Vaginal candidiasis is a yeast infection that can occur due to antibiotic use. We have prescribed medication to prevent this type of infection  while you are taking antibiotics for your sinus infection.  INSTRUCTIONS: Please complete the full bowel prep with 4000 mL of New Lightly before your CT scan. Take Zofran  as needed for nausea. Monitor for severe gastrointestinal bleeding and seek immediate medical attention if you notice large amounts of blood in your stools. Follow up with the surgery department for a potential procedure to remove adhesions and a colonoscopy. Continue taking Augmentin  for your sinus infection and the yeast infection medication as prescribed. Keep taking Veozah  and progesterone  for your menopausal symptoms as directed.  Your Providers PCP: Cone Bernardino MATSU, MD,  [phone]) Referring Provider: Cone Bernardino MATSU, MD,  [phone]) Care Team Provider: Gorge Ade, MD,  [phone]) Care Team Provider: Domenica Reusing, MD,  [phone])  NEXT STEPS: [x]  Early Intervention: Schedule sooner appointment, call our on-call services, or go to emergency room if there is any significant Increase in pain or discomfort New or worsening symptoms Sudden or severe changes in your health [x]  Flexible Follow-Up: We recommend a No follow-ups on file. for optimal routine care. This allows for progress monitoring and treatment adjustments. [x]  Preventive Care: Schedule your annual preventive care visit! It's typically covered by insurance and helps identify potential health issues early. [x]  Lab & X-ray Appointments: Incomplete tests scheduled today, or call to schedule. X-rays: [facility] (M-F, 8:30am-noon or 1pm-5pm). [x]  Medical Information Release: Sign a release form at front desk to obtain relevant medical information we don't have.  MAKING THE MOST OF OUR FOCUSED 20 MINUTE APPOINTMENTS: [x]   Clearly state your top concerns at the beginning of the visit to focus our discussion [x]   If you anticipate you will need more time, please inform the front desk during scheduling -  we can book multiple  appointments in the same week. [x]   If you have transportation problems- use our convenient video appointments or ask about transportation support. [x]   We can get down to business faster if you use MyChart to update information before the visit and submit non-urgent questions before your visit. Thank you for taking the time to provide details through MyChart.  Let our nurse know and she can import this information into your encounter documents.  Arrival and Wait Times: [x]   Arriving on time ensures that everyone receives prompt attention. [x]   Early morning (8a) and afternoon (1p) appointments tend to have shortest wait times. [x]   Unfortunately, we cannot delay appointments for late arrivals or hold slots during phone calls.  Getting Answers and Following Up [x]   Simple Questions & Concerns: For quick questions or basic follow-up after your visit, reach us  at [phone] or MyChart messaging. [x]   Complex Concerns: If your concern is more complex, scheduling an appointment might be best. Discuss this with the staff to find the most suitable option. [x]   Lab & Imaging Results: We'll contact you directly if results are abnormal or you don't use MyChart. Most normal results will be on MyChart within 2-3 business days, with a review message from Dr. Jesus. Haven't heard back in 2 weeks? Need results sooner? Contact us  at [phone]. [x]   Referrals: Our referral coordinator will manage specialist referrals. The specialist's office should contact you within 2 weeks to schedule an appointment. Call us  if you haven't heard from them after 2 weeks.  Staying Connected [x]   MyChart: Activate your MyChart for the fastest way to access results and message us . See the last page of this paperwork for instructions on how to activate.  Bring to Your Next Appointment [x]   Medications: Please bring all your medication bottles to your next appointment to ensure we have an accurate record of your  prescriptions. [x]   Health Diaries: If you're monitoring any health conditions at home, keeping a diary of your readings can be very helpful for discussions at your next appointment.  Billing [x]   X-ray & Lab Orders: These are billed by separate companies. Contact the invoicing company directly for questions or concerns. [x]   Visit Charges: Discuss any billing inquiries with our administrative services team.  Your Satisfaction Matters [x]   Share Your Experience: We strive for your satisfaction! If you have any complaints, or preferably compliments, please let Dr. Jesus know directly or contact our Practice Administrators, Manuelita Rubin or [employer], by asking at the front desk.   Reviewing Your Records [x]   Review this early draft of your clinical encounter notes below and the final encounter summary tomorrow on MyChart after its been completed.  All orders placed so far are visible here: Generalized abdominal pain -     DG Abd 2 Views; Future -     CT ABDOMEN PELVIS W CONTRAST; Future -     Ondansetron ; Take 1 tablet (8 mg total) by mouth every 8 (eight) hours as needed.  Dispense: 60 tablet; Refill: 11 -     Ambulatory referral to General Surgery  Gastrointestinal hemorrhage, unspecified gastrointestinal hemorrhage type -     DG Abd 2 Views; Future -     CT ABDOMEN PELVIS W CONTRAST; Future -     Ondansetron ; Take 1 tablet (8 mg total) by mouth every 8 (eight) hours as needed.  Dispense: 60 tablet; Refill: 11 -     Ambulatory referral to General Surgery  Nausea -     Ondansetron ; Take 1 tablet (8 mg total) by mouth every 8 (eight) hours as needed.  Dispense: 60 tablet; Refill: 11  Hot flushes, perimenopausal -     Gabapentin ; Take 2 capsules (600 mg total) by mouth 3 (three) times daily.  Dispense: 540 capsule; Refill: 4  History of surgery -     Ambulatory referral to General Surgery  Chronic maxillary sinusitis -     Amoxicillin -Pot Clavulanate; Take 1 tablet by mouth 2  (two) times daily.  Dispense: 20 tablet; Refill: 0  Vaginal candidiasis -     Fluconazole ; Take 1 tablet (150 mg total) by mouth every 3 (three) days for yeast infection.  Dispense: 3 tablet; Refill: 0  Other orders -     PEG 3350 -KCl-Na Bicarb-NaCl; Take 4,000 mLs by mouth once for 1 dose.  Dispense: 4000 mL; Refill: 0         Now that you have been diagnosed with worsening anemia, it's important to be aware of the following symptoms that may indicate a need for immediate medical attention:  Extreme fatigue or weakness Shortness of breath Rapid or irregular heartbeat Chest pain Dizziness or lightheadedness Pale or yellowish skin Cold hands and feet Headaches  Emergency symptoms specific to anemia include: A feeling that you're going to pass out Low blood pressure Low blood oxygen  saturation, which may be measured with a pulse oximeter at home3 Loss of consciousness Please advise the patient to seek emergency care if they experience any of these symptoms, or any other symptoms that cause concern.  It's always better to be safe and get checked out by a healthcare professional if you are unsure.    When to recheck the anemia depends on how you are feeling... but if you feel fine and dont see any significant bleeding, then I'd suggest doing it in about a month.  In the meantime be sure to consistently check toilet to assess your bleeding and discuss whether to take blood thinners with me.

## 2024-10-28 NOTE — Telephone Encounter (Signed)
 No note

## 2024-10-28 NOTE — Assessment & Plan Note (Signed)
 Partial small bowel obstruction due to adhesions  suspected  but differential is broad. Partial small bowel obstruction is likely due to adhesions from previous surgeries, including C-sections, gallbladder, appendix, and hysterectomy. Symptoms include abdominal pain, constipation, and weight loss over the past 2-3 weeks. A CT scan with contrast is ordered to evaluate for blockages and other abnormalities, given the alarm symptoms. An abdominal x-ray is also ordered to assess for bowel obstruction. A full bowel prep with 4000 mL of New Lightly is recommended before the CT scan to improve imaging clarity and potentially alleviate symptoms. Zofran  is prescribed for nausea associated with the bowel prep. She is referred to surgery for potential adhesion lysis and colonoscopy.  Gastrointestinal hemorrhage   Intermittent gastrointestinal bleeding is likely due to straining and partial bowel obstruction, with symptoms of dark stools and weight loss. Bleeding may be exacerbated by the obstruction and straining. A full bowel prep is recommended to potentially identify and address the source of bleeding. She is instructed to monitor for signs of severe bleeding, such as large amounts of blood in stools, and to seek immediate medical attention if these occur.

## 2024-10-28 NOTE — Telephone Encounter (Signed)
 See telephone note  Dr. Jennefer informed of situation, see note, aware that prep will not be called in for pt for upcoming CT Scan, pt denies allergy  to contrast or prednisone , states that when she received blood patch, she broke out in hives that night and next morning, but has had other tests completed with contrast and no reaction was noted  Pt to reach out to medical doctor and have him change allergies in EPIC chart

## 2024-10-28 NOTE — Telephone Encounter (Signed)
 See telephone note

## 2024-10-28 NOTE — Progress Notes (Signed)
 ==============================  [facility]: [phone]   -- Medical Office Visit --  Patient: Diane [name]      Age: 43 y.o.       Sex:  female  Date:   10/28/2024 Today's Healthcare Provider: Bernardino KANDICE Cone, MD  ==============================   Chief Complaint: Blood In Stools (Going on for 2 weeks has lost 20lbs in two weeks or so stomach pains are so bad. Bleeding comes and goes not every day) and Diarrhea (Stomach pains off and on )  Discussed the use of AI scribe software for clinical note transcription with the patient, who gave verbal consent to proceed.  History of Present Illness 43 year old female with a history of gastric ulcers who presents with abdominal pain, weight loss, and gastrointestinal bleeding.  Repeatedly she has not followed guidance to go to emergency room and continues to insist she will not go; history long waits and child care responsibilities interfere.  She has been experiencing severe abdominal pain for the past two to three weeks, accompanied by significant weight loss of 20 pounds. The pain is intense and persistent, with episodes of diarrhea and nausea. Initially, she thought it was a stomach bug, but a doctor at [facility] ruled that out. The pain is described as cramp-like, sometimes relieved by bowel movements, but not consistently. She also reports a sensation of fullness and bloating in her abdomen.  She mentions experiencing gastrointestinal bleeding, which occurs intermittently, lasting about a week before slowing down. Her stools have been very dark, and she has been unable to eat properly due to nausea and diarrhea, leading to further weight loss.  Her past surgical history includes two C-sections, gallbladder removal, appendectomy, and hysterectomy. She is currently taking Senna daily, omeprazole, and Amitiza for her gastrointestinal symptoms. She also takes gabapentin  for migraines and other symptoms, and  she is on Veozah  and progesterone . She has a history of gastric ulcers and is currently on omeprazole 40 mg. No known allergies to contrast dye, despite a previous note indicating otherwise.  She reports symptoms of fatigue, weakness, dizziness, and headaches, but as of the last blood work, she was not anemic.  Socially, she is currently caring for a friend's children, which she finds rewarding but notes that it exposes her to frequent illnesses, such as a sinus infection she suspects she is developing. 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), Choking, Cholelithiasis and acute cholecystitis without obstruction (12/02/2016), Chronic cough (12/27/2013), Depression, Dry mouth (08/12/2017), Dysmenorrhea, Family history of adverse reaction to anesthesia, Gastric ulcer (03/16/2015), GERD (gastroesophageal reflux disease), Headache (07/09/2007), History of gastric ulcer, History of kidney  stones, Hyperlipidemia, IBS (irritable bowel syndrome), Lower extremity edema (07/26/2021), Migraines,  Mild obstructive sleep apnea, Nausea and vomiting (12/03/2016), OAB (overactive bladder) (08/12/2017), Osteoarthritis, Osteochondral lesion (09/2016), Osteomyelitis of knee region [facility]), Peripheral vascular disease, Plantar fasciitis of left foot, Pleural effusion (12/15/2013), PONV (postoperative nausea and vomiting), Pseudotumor cerebri, RUQ pain, S/P dilatation of esophageal stricture (07/2018), Sinusitis (08/12/2018), SUI (stress urinary incontinence, female), Wears contact lenses, and Wears partial dentures. Past Surgical History:   has a past surgical history that includes Cesarean section (12/24/2009); Cesarean section (11/20/2011); laparoscopic appendectomy (N/A, 06/11/2014); Bladder surgery (child); Wisdom tooth extraction; Ovarian cyst surgery (Right, 03/05/2007); Cystoscopy with retrograde pyelogram, ureteroscopy and stent placement (Left, 08/10/2006); Synovectomy (Right, 12/13/2014); Knee arthroscopy with lateral release (Right, 12/13/2014); ORIF ankle fracture (Right, 01/05/2016); Hardware Removal (Right, 07/01/2016); Ankle arthroscopy (Right, 10/22/2016); Cholecystectomy (N/A, 12/03/2016); Ankle arthroscopy (Right, 09-15-2017   @[facility] ); Tonsillectomy and adenoidectomy (child); Esophagogastroduodenoscopy (egd) with esophageal dilation (07/2018); Robotic assisted laparoscopic hysterectomy and salpingectomy (Bilateral, 08/27/2018); Chondroplasty (Right, 01/12/2024); Harvest bone graft (Right, 01/12/2024); Implantation, matrix-induced autologous chondrocytes (Left, 05/24/2024); and Knee arthroscopy (Left, 05/24/2024). Social History:   reports that she has quit smoking. Her smoking use included cigarettes. She has a 7.5 pack-year smoking history. She uses smokeless tobacco. She reports current alcohol use of about 2.0 standard drinks of alcohol per week. She reports that she does not use drugs. Family History:  family  history includes Alcohol abuse in her father and maternal grandfather; Anesthesia problems in her father; Arthritis in her father, maternal grandfather, maternal grandmother, mother, and paternal grandmother; Asthma in her maternal grandmother and mother; Brain cancer in her father; Breast cancer in her maternal grandmother; COPD in her paternal grandmother; Cancer in her father, maternal grandfather, and maternal grandmother; Cancer - Prostate in her maternal grandfather; Depression in her father; Diabetes in her maternal grandmother; Drug abuse in her father; Early death in her father; Heart attack in her maternal grandmother; Heart disease in her maternal grandmother; Hyperlipidemia in her maternal grandfather and maternal grandmother; Hypertension in her father, maternal grandfather, maternal grandmother, and paternal grandmother; Irritable bowel syndrome in her mother; Learning disabilities in her daughter, mother, and son; Lung cancer in her father; Melanoma in her maternal grandfather; Miscarriages / Stillbirths in her mother; Multiple myeloma in her mother; Ovarian cancer in her maternal grandmother; Prostate cancer in her maternal grandfather. Allergies:  is allergic to droperidol , iodinated contrast media, metoclopramide , nickel, prochlorperazine, nsaids, tolmetin, oxybutynin , adhesive [tape], metoclopramide  hcl, prednisone , prochlorperazine edisylate, and sulfa antibiotics.   Medication Reconciliation: Current Outpatient Medications on File Prior to Visit  Medication Sig   lisdexamfetamine (VYVANSE ) 30 MG capsule Take 1 capsule (30 mg total) by mouth daily.   albuterol  (PROVENTIL  HFA;VENTOLIN  HFA) 108 (90 Base) MCG/ACT inhaler Inhale 2 puffs into the lungs every 6 (six) hours as needed for wheezing or shortness of breath.    ALPRAZolam  (XANAX ) 0.25 MG tablet Take 1 Tablet by mouth daily, as needed, use as little as possible and not after 3pm   ALPRAZolam  (XANAX ) 0.5 MG tablet Take 1 tablet (0.5  mg total) by mouth 3 (three) times daily as needed for up to 12 doses for anxiety.   amphetamine -dextroamphetamine  (ADDERALL XR) 20 MG 24 hr capsule Take 1 capsule (20 mg total) by mouth daily. (Patient not taking: Reported on 10/28/2024)   atorvastatin  (LIPITOR) 40 MG tablet TAKE 1 TABLET BY MOUTH EVERY DAY   Bacillus Coagulans-Inulin (PROBIOTIC) 1-250 BILLION-MG CAPS    cetirizine (ZYRTEC) 10 MG tablet Take by mouth.   diclofenac  Sodium (VOLTAREN ) 1 % GEL Apply 4 g topically 4 (  four) times daily.   EMGALITY  120 MG/ML SOAJ Inject 120 mg into the skin every 30 (thirty) days.   Fezolinetant  (VEOZAH ) 45 MG TABS Take 1 tablet (45 mg total) by mouth daily at 6 (six) AM. (Patient not taking: Reported on 10/19/2024)   fluticasone  (FLONASE ) 50 MCG/ACT nasal spray Place 2 sprays into both nostrils daily.   hydrOXYzine  (ATARAX ) 50 MG tablet Take 1 tablet (50 mg total) by mouth 3 (three) times daily as needed.   lubiprostone (AMITIZA) 24 MCG capsule TAKE ONE CAPSULE (24 MCG DOSE) BY MOUTH 2 (TWO) TIMES DAILY WITH MEALS.   magnesium  oxide (MAG-OX) 400 MG tablet Take by mouth.   meclizine  (ANTIVERT ) 25 MG tablet Take 1 tablet (25 mg total) by mouth 3 (three) times daily as needed for dizziness.   MELATONIN GUMMIES PO Take 1-2 each by mouth at bedtime.   Multiple Vitamins-Minerals (EQL CENTURY WOMENS) TABS    NUCYNTA  100 MG TABS Take 1 tablet by mouth every 8 (eight) hours as needed.   omeprazole (PRILOSEC) 40 MG capsule TAKE 1 (ONE) CAPSUL CAPSULE BY MOUTH DAILY   progesterone  (PROMETRIUM ) 100 MG capsule Take 3 capsules (300 mg total) by mouth daily.   promethazine  (PHENERGAN ) 25 MG tablet Take 1 tablet (25 mg total) by mouth every 8 (eight) hours as needed for nausea or vomiting.   senna-docusate (SENOKOT-S) 8.6-50 MG tablet Take 1 tablet by mouth daily.   Tapentadol  HCl (NUCYNTA ) 100 MG TABS Take 1 tablet (100 mg total) by mouth every 8 (eight) hours as needed.   tiZANidine  (ZANAFLEX ) 4 MG tablet Take 4 mg  by mouth every 6 (six) hours as needed for muscle spasms.    traZODone (DESYREL) 50 MG tablet Take 1-2 tablets by mouth at bedtime as needed. (Patient not taking: Reported on 10/28/2024)   triamcinolone  cream (KENALOG ) 0.1 % APPLY TO AFFECTED AREA TWICE A DAY   venlafaxine  XR (EFFEXOR -XR) 150 MG 24 hr capsule TAKE 2 CAPS BY MOUTH DAILY   zolmitriptan  (ZOMIG ) 5 MG tablet Take 1 tablet (5 mg total) by mouth as needed for migraine.   zonisamide (ZONEGRAN) 100 MG capsule Take 200 mg by mouth at bedtime.   Current Facility-Administered Medications on File Prior to Visit  Medication   Erenumab -aooe SOAJ 140 mg   Medications Discontinued During This Encounter  Medication Reason   ondansetron  (ZOFRAN -ODT) 8 MG disintegrating tablet Reorder   gabapentin  (NEURONTIN ) 300 MG capsule Reorder   fluconazole  (DIFLUCAN ) 150 MG tablet Reorder     Physical Exam:    10/28/2024    9:05 AM 10/19/2024    4:08 PM 09/09/2024    1:02 PM  Vitals with BMI  Height 5' 7  5' 7  Weight 230 lbs 13 oz 236 lbs 8 oz 247 lbs 10 oz  BMI 36.14  38.77  Systolic 122 120 869  Diastolic 80 78 87  Pulse 90 88 103  Vital signs reviewed.  Nursing notes reviewed. Weight trend reviewed. Physical Activity: Not on file   General Appearance:  No acute distress appreciable.   Well-groomed, healthy-appearing female.  Well proportioned with no abnormal fat distribution.  Good muscle tone. Pulmonary:  Normal work of breathing at rest, no respiratory distress apparent. SpO2: 98 %  Musculoskeletal: All extremities are intact.  Neurological:  Awake, alert, oriented, and engaged.  No obvious focal neurological deficits or cognitive impairments.  Sensorium seems unclouded.   Speech is clear and coherent with logical content. Psychiatric:  Appropriate mood, pleasant and cooperative demeanor,  thoughtful and engaged during the exam  Tinkling sounds but not high pitched all 4 quadrants soft, diffusely tender,mildly distended        10/28/2024    9:19 AM 06/17/2024    1:13 PM 03/08/2024   10:54 AM 06/17/2023   10:20 AM  PHQ 2/9 Scores  PHQ - 2 Score 2 1 0 2  PHQ- 9 Score 10 3  0  7      Data saved with a previous flowsheet row definition   Lab on 10/27/2024  Component Date Value Ref Range Status   Lipase 10/27/2024 22.0  11.0 - 59.0 U/L Final   Amylase 10/27/2024 24 (L)  27 - 131 U/L Final   Sodium 10/27/2024 138  135 - 145 mEq/L Final   Potassium 10/27/2024 3.1 (L)  3.5 - 5.1 mEq/L Final   Chloride 10/27/2024 102  96 - 112 mEq/L Final   CO2 10/27/2024 24  19 - 32 mEq/L Final   Glucose, Bld 10/27/2024 107 (H)  70 - 99 mg/dL Final   BUN 87/88/7974 9  6 - 23 mg/dL Final   Creatinine, Ser 10/27/2024 0.89  0.40 - 1.20 mg/dL Final   Total Bilirubin 10/27/2024 0.3  0.2 - 1.2 mg/dL Final   Alkaline Phosphatase 10/27/2024 123 (H)  39 - 117 U/L Final   AST 10/27/2024 24  0 - 37 U/L Final   ALT 10/27/2024 19  0 - 35 U/L Final   Total Protein 10/27/2024 7.5  6.0 - 8.3 g/dL Final   Albumin 87/88/7974 4.4  3.5 - 5.2 g/dL Final   GFR 87/88/7974 80.10  >60.00 mL/min Final   Calcium  10/27/2024 9.5  8.4 - 10.5 mg/dL Final   WBC 87/88/7974 5.9  4.0 - 10.5 K/uL Final   RBC 10/27/2024 4.54  3.87 - 5.11 Mil/uL Final   Hemoglobin 10/27/2024 12.4  12.0 - 15.0 g/dL Final   HCT 87/88/7974 37.8  36.0 - 46.0 % Final   MCV 10/27/2024 83.3  78.0 - 100.0 fl Final   MCHC 10/27/2024 32.8  30.0 - 36.0 g/dL Final   RDW 87/88/7974 14.1  11.5 - 15.5 % Final   Platelets 10/27/2024 247.0  150.0 - 400.0 K/uL Final   Neutrophils Relative % 10/27/2024 58.9  43.0 - 77.0 % Final   Lymphocytes Relative 10/27/2024 29.4  12.0 - 46.0 % Final   Monocytes Relative 10/27/2024 7.8  3.0 - 12.0 % Final   Eosinophils Relative 10/27/2024 2.9  0.0 - 5.0 % Final   Basophils Relative 10/27/2024 1.0  0.0 - 3.0 % Final   Neutro Abs 10/27/2024 3.5  1.4 - 7.7 K/uL Final   Lymphs Abs 10/27/2024 1.7  0.7 - 4.0 K/uL Final   Monocytes Absolute 10/27/2024 0.5  0.1 - 1.0  K/uL Final   Eosinophils Absolute 10/27/2024 0.2  0.0 - 0.7 K/uL Final   Basophils Absolute 10/27/2024 0.1  0.0 - 0.1 K/uL Final  Office Visit on 10/19/2024  Component Date Value Ref Range Status   Influenza A, POC 10/19/2024 Negative  Negative Final   Influenza B, POC 10/19/2024 Negative  Negative Final   SARS Coronavirus 2 Ag 10/19/2024 Negative  Negative Final  Admission on 09/01/2024, Discharged on 09/01/2024  Component Date Value Ref Range Status   WBC 09/01/2024 10.1  4.0 - 10.5 K/uL Final   RBC 09/01/2024 4.47  3.87 - 5.11 MIL/uL Final   Hemoglobin 09/01/2024 12.3  12.0 - 15.0 g/dL Final   HCT 89/83/7974 38.4  36.0 - 46.0 %  Final   MCV 09/01/2024 85.9  80.0 - 100.0 fL Final   MCH 09/01/2024 27.5  26.0 - 34.0 pg Final   MCHC 09/01/2024 32.0  30.0 - 36.0 g/dL Final   RDW 89/83/7974 14.3  11.5 - 15.5 % Final   Platelets 09/01/2024 310  150 - 400 K/uL Final   nRBC 09/01/2024 0.0  0.0 - 0.2 % Final   Neutrophils Relative % 09/01/2024 90  % Final   Neutro Abs 09/01/2024 9.1 (H)  1.7 - 7.7 K/uL Final   Lymphocytes Relative 09/01/2024 6  % Final   Lymphs Abs 09/01/2024 0.6 (L)  0.7 - 4.0 K/uL Final   Monocytes Relative 09/01/2024 4  % Final   Monocytes Absolute 09/01/2024 0.4  0.1 - 1.0 K/uL Final   Eosinophils Relative 09/01/2024 0  % Final   Eosinophils Absolute 09/01/2024 0.0  0.0 - 0.5 K/uL Final   Basophils Relative 09/01/2024 0  % Final   Basophils Absolute 09/01/2024 0.0  0.0 - 0.1 K/uL Final   Immature Granulocytes 09/01/2024 0  % Final   Abs Immature Granulocytes 09/01/2024 0.04  0.00 - 0.07 K/uL Final   Sodium 09/01/2024 138  135 - 145 mmol/L Final   Potassium 09/01/2024 3.7  3.5 - 5.1 mmol/L Final   Chloride 09/01/2024 108  98 - 111 mmol/L Final   CO2 09/01/2024 18 (L)  22 - 32 mmol/L Final   Glucose, Bld 09/01/2024 143 (H)  70 - 99 mg/dL Final   BUN 89/83/7974 9  6 - 20 mg/dL Final   Creatinine, Ser 09/01/2024 1.01 (H)  0.44 - 1.00 mg/dL Final   Calcium  09/01/2024  9.3  8.9 - 10.3 mg/dL Final   GFR, Estimated 09/01/2024 >60  >60 mL/min Final   Anion gap 09/01/2024 12  5 - 15 Final  Admission on 07/29/2024, Discharged on 07/30/2024  Component Date Value Ref Range Status   Sodium 07/29/2024 136  135 - 145 mmol/L Final   Potassium 07/29/2024 3.8  3.5 - 5.1 mmol/L Final   Chloride 07/29/2024 101  98 - 111 mmol/L Final   CO2 07/29/2024 21 (L)  22 - 32 mmol/L Final   Glucose, Bld 07/29/2024 106 (H)  70 - 99 mg/dL Final   BUN 90/87/7974 7  6 - 20 mg/dL Final   Creatinine, Ser 07/29/2024 0.86  0.44 - 1.00 mg/dL Final   Calcium  07/29/2024 9.1  8.9 - 10.3 mg/dL Final   GFR, Estimated 07/29/2024 >60  >60 mL/min Final   Anion gap 07/29/2024 14  5 - 15 Final   WBC 07/29/2024 8.3  4.0 - 10.5 K/uL Final   RBC 07/29/2024 4.49  3.87 - 5.11 MIL/uL Final   Hemoglobin 07/29/2024 12.1  12.0 - 15.0 g/dL Final   HCT 90/87/7974 38.7  36.0 - 46.0 % Final   MCV 07/29/2024 86.2  80.0 - 100.0 fL Final   MCH 07/29/2024 26.9  26.0 - 34.0 pg Final   MCHC 07/29/2024 31.3  30.0 - 36.0 g/dL Final   RDW 90/87/7974 14.6  11.5 - 15.5 % Final   Platelets 07/29/2024 362  150 - 400 K/uL Final   nRBC 07/29/2024 0.0  0.0 - 0.2 % Final   Neutrophils Relative % 07/29/2024 54  % Final   Neutro Abs 07/29/2024 4.4  1.7 - 7.7 K/uL Final   Lymphocytes Relative 07/29/2024 35  % Final   Lymphs Abs 07/29/2024 3.0  0.7 - 4.0 K/uL Final   Monocytes Relative 07/29/2024 6  %  Final   Monocytes Absolute 07/29/2024 0.5  0.1 - 1.0 K/uL Final   Eosinophils Relative 07/29/2024 4  % Final   Eosinophils Absolute 07/29/2024 0.4  0.0 - 0.5 K/uL Final   Basophils Relative 07/29/2024 1  % Final   Basophils Absolute 07/29/2024 0.1  0.0 - 0.1 K/uL Final   Immature Granulocytes 07/29/2024 0  % Final   Abs Immature Granulocytes 07/29/2024 0.02  0.00 - 0.07 K/uL Final  Office Visit on 03/02/2024  Component Date Value Ref Range Status   D-Dimer, Quant 03/02/2024 1.69 (H)  <0.50 mcg/mL FEU Final  Admission on  02/29/2024, Discharged on 03/01/2024  Component Date Value Ref Range Status   Sodium 02/29/2024 139  135 - 145 mmol/L Final   Potassium 02/29/2024 3.5  3.5 - 5.1 mmol/L Final   Chloride 02/29/2024 103  98 - 111 mmol/L Final   CO2 02/29/2024 26  22 - 32 mmol/L Final   Glucose, Bld 02/29/2024 112 (H)  70 - 99 mg/dL Final   BUN 95/85/7974 9  6 - 20 mg/dL Final   Creatinine, Ser 02/29/2024 0.71  0.44 - 1.00 mg/dL Final   Calcium  02/29/2024 8.8 (L)  8.9 - 10.3 mg/dL Final   GFR, Estimated 02/29/2024 >60  >60 mL/min Final   Anion gap 02/29/2024 10  5 - 15 Final   WBC 02/29/2024 8.9  4.0 - 10.5 K/uL Final   RBC 02/29/2024 4.48  3.87 - 5.11 MIL/uL Final   Hemoglobin 02/29/2024 12.6  12.0 - 15.0 g/dL Final   HCT 95/85/7974 38.6  36.0 - 46.0 % Final   MCV 02/29/2024 86.2  80.0 - 100.0 fL Final   MCH 02/29/2024 28.1  26.0 - 34.0 pg Final   MCHC 02/29/2024 32.6  30.0 - 36.0 g/dL Final   RDW 95/85/7974 13.9  11.5 - 15.5 % Final   Platelets 02/29/2024 313  150 - 400 K/uL Final   nRBC 02/29/2024 0.0  0.0 - 0.2 % Final   Troponin I (High Sensitivity) 02/29/2024 3  <18 ng/L Final   TSH 02/29/2024 1.090  0.350 - 4.500 uIU/mL Final  Office Visit on 02/02/2024  Component Date Value Ref Range Status   Albumin 02/02/2024 4.1  3.9 - 4.9 g/dL Final   Dehydroepiandrosterone 02/02/2024 38  31 - 701 ng/dL Final   LH 96/81/7974 65.0  mIU/mL Final   FSH 02/02/2024 24.3  mIU/mL Final   Prolactin 02/02/2024 16.9  4.8 - 33.4 ng/mL Final   Progesterone  02/02/2024 0.3  ng/mL Final   Estrogen 02/02/2024 276  pg/mL Final   Sex Hormone Binding 02/02/2024 31.8  24.6 - 122.0 nmol/L Final   Vit D, 25-Hydroxy 02/02/2024 44.8  30.0 - 100.0 ng/mL Final  Office Visit on 09/09/2023  Component Date Value Ref Range Status   Rapid Strep A Screen 09/09/2023 Positive (A)  Negative Final   Influenza A, POC 09/09/2023 Negative  Negative Final   Influenza B, POC 09/09/2023 Negative  Negative Final   SARS Coronavirus 2 Ag  09/09/2023 Negative  Negative Final  Office Visit on 07/13/2023  Component Date Value Ref Range Status   Hemoglobin A1C 07/13/2023 5.8 (A)  4.0 - 5.6 % Final  Admission on 06/20/2023, Discharged on 06/20/2023  Component Date Value Ref Range Status   Lipase 06/20/2023 26  11 - 51 U/L Final   Sodium 06/20/2023 138  135 - 145 mmol/L Final   Potassium 06/20/2023 3.7  3.5 - 5.1 mmol/L Final   Chloride 06/20/2023 103  98 -  111 mmol/L Final   CO2 06/20/2023 27  22 - 32 mmol/L Final   Glucose, Bld 06/20/2023 119 (H)  70 - 99 mg/dL Final   BUN 91/96/7975 10  6 - 20 mg/dL Final   Creatinine, Ser 06/20/2023 0.75  0.44 - 1.00 mg/dL Final   Calcium  06/20/2023 9.4  8.9 - 10.3 mg/dL Final   Total Protein 91/96/7975 7.3  6.5 - 8.1 g/dL Final   Albumin 91/96/7975 4.0  3.5 - 5.0 g/dL Final   AST 91/96/7975 28  15 - 41 U/L Final   ALT 06/20/2023 23  0 - 44 U/L Final   Alkaline Phosphatase 06/20/2023 108  38 - 126 U/L Final   Total Bilirubin 06/20/2023 0.3  0.3 - 1.2 mg/dL Final   GFR, Estimated 06/20/2023 >60  >60 mL/min Final   Anion gap 06/20/2023 8  5 - 15 Final   WBC 06/20/2023 4.8  4.0 - 10.5 K/uL Final   RBC 06/20/2023 4.20  3.87 - 5.11 MIL/uL Final   Hemoglobin 06/20/2023 11.6 (L)  12.0 - 15.0 g/dL Final   HCT 91/96/7975 35.3 (L)  36.0 - 46.0 % Final   MCV 06/20/2023 84.0  80.0 - 100.0 fL Final   MCH 06/20/2023 27.6  26.0 - 34.0 pg Final   MCHC 06/20/2023 32.9  30.0 - 36.0 g/dL Final   RDW 91/96/7975 13.8  11.5 - 15.5 % Final   Platelets 06/20/2023 263  150 - 400 K/uL Final   nRBC 06/20/2023 0.0  0.0 - 0.2 % Final   Color, Urine 06/20/2023 YELLOW  YELLOW Final   APPearance 06/20/2023 CLEAR  CLEAR Final   Specific Gravity, Urine 06/20/2023 >1.046 (H)  1.005 - 1.030 Final   pH 06/20/2023 7.5  5.0 - 8.0 Final   Glucose, UA 06/20/2023 NEGATIVE  NEGATIVE mg/dL Final   Hgb urine dipstick 06/20/2023 NEGATIVE  NEGATIVE Final   Bilirubin Urine 06/20/2023 NEGATIVE  NEGATIVE Final   Ketones, ur  06/20/2023 NEGATIVE  NEGATIVE mg/dL Final   Protein, ur 91/96/7975 TRACE (A)  NEGATIVE mg/dL Final   Nitrite 91/96/7975 NEGATIVE  NEGATIVE Final   Leukocytes,Ua 06/20/2023 NEGATIVE  NEGATIVE Final  There may be more visits with results that are not included.  No image results found. DG INJECT DIAG/THERA/INC NEEDLE/CATH/PLC EPI/LUMB/SAC W/IMG Result Date: 08/30/2024 CLINICAL DATA:  43 year old female with history of persistent headache after lumbar puncture on 08/26/2024. EXAM: DG INJECT/THERA INC NEEDLE/CATH/PLC EPI/LUMB/SAC W/IMG PROCEDURE: The procedure, risks, benefits, and alternatives were explained to the patient. Questions regarding the procedure were encouraged and answered. The patient understands and consents to the procedure. LUMBAR EPIDURAL BLOOD PATCH: 15 ml of blood were withdrawn from the patient's antecubital fossa. An epidural approach was taken on the right at L3-L4 using a 20 gauge epidural needle. Epidural positioning was confirmed by injecting a small amount of 1.62ml Isovue -M 200. There was no vascular communication. Five ml of the patient's blood was slowly injected into the epidural space in this location. The procedure was well-tolerated and she was discharged in good condition with instructions to lie down for additional day. IMPRESSION: Lumbar epidural blood patch on the right at L3-L4. Ester Sides, MD [facility] Electronically Signed   By: Ester Sides M.D.   On: 08/30/2024 12:12   MR BRAIN WO CONTRAST Result Date: 08/26/2024 EXAM: MRI BRAIN WITHOUT CONTRAST 08/22/2024 10:47:11 AM TECHNIQUE: Multiplanar multisequence MRI of the head/brain was performed without the administration of intravenous contrast. COMPARISON: CTA head and  neck 07/29/2024. MRI head 04/13/2008. CLINICAL HISTORY: Headache, neuro deficit, dizziness for 3 weeks. Evaluation for Chiari malformation. FINDINGS: BRAIN AND VENTRICLES: There is no evidence  of an acute infarct, intracranial hemorrhage, mass, midline shift, hydrocephalus, or extra-axial fluid collection. Cerebral volume is normal. The ventricles are normal in size. The cerebellar tonsils are normally positioned. The brain is normal in signal. The sella is unremarkable. Normal flow voids. ORBITS: No acute abnormality. SINUSES AND MASTOIDS: No acute abnormality. BONES AND SOFT TISSUES: Normal marrow signal. No acute soft tissue abnormality. IMPRESSION: 1. Negative head MRI. Electronically signed by: Dasie Hamburg MD 08/26/2024 12:24 PM EDT RP Workstation: [id number]   MR Venogram Head Result Date: 08/26/2024 EXAM: MRV BRAIN 08/25/2024 05:23:46 PM TECHNIQUE: Multiplanar multisequence MRV of the head was performed with and without the administration of 10 ml of Vueway  intravenous contrast. Multiplanar 2D and 3D reformatted images are provided for review. COMPARISON: CTA head and neck 07/29/2024 CLINICAL HISTORY: idiopathic intracranial hypertension. FINDINGS: Stenoses of the transverse-sigmoid sinus junctions appear moderate to severe on the recent prior CTA but are less conspicuous on this MRI. There is no dural venous sinus thrombosis. IMPRESSION: 1. No dural venous sinus thrombosis. 2. Bilateral transversesigmoid sinus junction stenoses, more apparent on the prior CTA. Electronically signed by: Dasie Hamburg MD 08/26/2024 12:20 PM EDT RP Workstation: [id number]   DG FL GUIDED LUMBAR PUNCTURE Result Date: 08/26/2024 EXAM: FLUOROSCOPIC GUIDED LUMBAR PUNCTURE 08/26/2024 11:11:27 AM FLUOROSCOPY DOSE AND TYPE: Radiation Dose Index: Reference Air Kerma (in mGy) = 2.60 COMPARISON: None available CLINICAL HISTORY: IIH. PROCEDURE: OPERATOR: Informed consent was obtained after the risks and benefits of the procedure were discussed with the patient and all questions were answered fully. Universal protocol was observed and a standard timeout was performed. The patient was positioned prone and the back was  prepped and draped in the normal sterile fashion. 1% lidocaine  was used for local anesthesia. The subarachnoid space was accessed with a 22-gauge 6-inch Gertie Marx spinal needle via a right interlaminar approach at the L3-L4 level. The patient was then rolled into the left lateral decubitus position, and an opening pressure of 30 cm water was measured. 21 ml of CSF were removed and discarded. A closing pressure of 20 cm water was measured. The stylet was reinserted, spinal needle was removed and brief pressure was applied at the puncture site. There were no immediate complications and the patient tolerated the procedure well. IMPRESSION: 1. Successful fluoroscopically-guided lumbar puncture with elevated opening pressure of 30 cm water. Electronically signed by: Dasie Hamburg MD 08/26/2024 12:08 PM EDT RP Workstation: [id number]       ASSESSMENT & PLAN   Assessment & Plan Generalized abdominal pain History of surgery Gastrointestinal hemorrhage, unspecified gastrointestinal hemorrhage type Nausea Partial small bowel obstruction due to adhesions  suspected  but differential is broad. Partial small bowel obstruction is likely due to adhesions from previous surgeries, including C-sections, gallbladder, appendix, and hysterectomy. Symptoms include abdominal pain, constipation, and weight loss over the past 2-3 weeks. A CT scan with contrast is ordered to evaluate for blockages and other abnormalities, given the alarm symptoms. An abdominal x-ray is also ordered to assess for bowel obstruction. A full bowel prep with 4000 mL of [place] is recommended before the CT scan to improve imaging clarity and potentially alleviate symptoms. Zofran  is prescribed for nausea associated with the bowel prep. She is referred to surgery for potential adhesion lysis and colonoscopy.  Gastrointestinal hemorrhage   Intermittent gastrointestinal bleeding is likely due to  straining and partial bowel obstruction, with symptoms  of dark stools and weight loss. Bleeding may be exacerbated by the obstruction and straining. A full bowel prep is recommended to potentially identify and address the source of bleeding. She is instructed to monitor for signs of severe bleeding, such as large amounts of blood in stools, and to seek immediate medical attention if these occur. Hot flushes, perimenopausal Menopausal symptoms   Menopausal symptoms are managed with Veozah  and progesterone . She is aware of the interaction between Veozah  and the patch, which can lead to excessive hormone levels. Continue Veozah  and progesterone  as prescribed. Chronic maxillary sinusitis  Chronic maxillary sinusitis with acute sinus infection   There is an acute exacerbation of chronic maxillary sinusitis, likely due to exposure to sick children, with symptoms of sinus congestion and discomfort. Augmentin  is prescribed for the sinus infection, and yeast infection medication is prescribed to prevent antibiotic-associated yeast infection. Vaginal candidiasis There is a potential for yeast infection due to antibiotic use for the sinus infection. Yeast infection medication is prescribed to prevent antibiotic-associated yeast infection.  ORDER ASSOCIATIONS  #   DIAGNOSIS / CONDITION ICD-10 ENCOUNTER ORDER     ICD-10-CM   1. Generalized abdominal pain  R10.84 DG Abd 2 Views    CT ABDOMEN PELVIS W CONTRAST    ondansetron  (ZOFRAN -ODT) 8 MG disintegrating tablet    Ambulatory referral to General Surgery    2. Gastrointestinal hemorrhage, unspecified gastrointestinal hemorrhage type  K92.2 DG Abd 2 Views    CT ABDOMEN PELVIS W CONTRAST    ondansetron  (ZOFRAN -ODT) 8 MG disintegrating tablet    Ambulatory referral to General Surgery    3. Nausea  R11.0 ondansetron  (ZOFRAN -ODT) 8 MG disintegrating tablet    4. Hot flushes, perimenopausal  N95.1 gabapentin  (NEURONTIN ) 300 MG capsule    5. History of surgery  Z98.890 Ambulatory referral to General Surgery     6. Chronic maxillary sinusitis  J32.0 amoxicillin -clavulanate (AUGMENTIN ) 875-125 MG tablet    7. Vaginal candidiasis  B37.31 fluconazole  (DIFLUCAN ) 150 MG tablet           Orders Placed in Encounter:   Imaging Orders         DG Abd 2 Views         CT ABDOMEN PELVIS W CONTRAST     Referral Orders         Ambulatory referral to General Surgery     Meds ordered this encounter  Medications   polyethylene glycol-electrolytes (NULYTELY ) 420 g solution    Sig: Take 4,000 mLs by mouth once for 1 dose.    Dispense:  4000 mL    Refill:  0   ondansetron  (ZOFRAN -ODT) 8 MG disintegrating tablet    Sig: Take 1 tablet (8 mg total) by mouth every 8 (eight) hours as needed.    Dispense:  60 tablet    Refill:  11   gabapentin  (NEURONTIN ) 300 MG capsule    Sig: Take 2 capsules (600 mg total) by mouth 3 (three) times daily.    Dispense:  540 capsule    Refill:  4   amoxicillin -clavulanate (AUGMENTIN ) 875-125 MG tablet    Sig: Take 1 tablet by mouth 2 (two) times daily.    Dispense:  20 tablet    Refill:  0   fluconazole  (DIFLUCAN ) 150 MG tablet    Sig: Take 1 tablet (150 mg total) by mouth every 3 (three) days for yeast infection.    Dispense:  3 tablet  Refill:  0    Orders Placed This Encounter  Procedures   DG Abd 2 Views    Standing Status:   Future    Number of Occurrences:   1    Expiration Date:   10/28/2025    Reason for Exam (SYMPTOM  OR DIAGNOSIS REQUIRED):   severe abdominal pain and gi bleeding    Is patient pregnant?:   No    Preferred imaging location?:   Internal   CT ABDOMEN PELVIS W CONTRAST    Patient has contrast dye allergy  listed but it wasn't the actual dye she was allergic to, it was a skin reaction from a blood patch procedure. BCBS epic WT 230 No Diabetes No allergies to contrast dye No kidney cancer/kidney surgery/yes both kidneys No special needs/No spinal cord stimulator/body  injector/glucose monitor/port/ attached) If you need to cancel your  appt, please do so 24 hrs prior to your appointment to avoid getting charged a no show fee of $75 PT is aware/PT verbal understood instructions given Pt aware to p/u contrast-pansy Stacy aware of allergies-pansy    Standing Status:   Future    Expiration Date:   10/28/2025    If indicated for the ordered procedure, I authorize the administration of contrast media per Radiology protocol:   Yes    Does the patient have a contrast media/X-ray dye allergy ?:   No    Is patient pregnant?:   No    Preferred imaging location?:   [facility]    If indicated for the ordered procedure, I authorize the administration of oral contrast media per Radiology protocol:   Yes   Ambulatory referral to General Surgery    Referral Priority:   Routine    Referral Type:   Surgical    Referral Reason:   Specialty Services Required    Requested Specialty:   General Surgery    Number of Visits Requested:   1      This document was synthesized by artificial intelligence (Abridge) using HIPAA-compliant recording of the clinical interaction;   We discussed the use of AI scribe software for clinical note transcription with the patient, who gave verbal consent to proceed. additional Info: This encounter employed state-of-the-art, real-time, collaborative documentation. The patient actively reviewed and assisted in updating their electronic medical record on a shared screen, ensuring transparency and facilitating joint problem-solving for the problem list, overview, and plan. This approach promotes accurate, informed care. The treatment plan was discussed and reviewed in detail, including medication safety, potential side effects, and all patient questions. We confirmed understanding and comfort with the plan. Follow-up instructions were established, including contacting the office for any concerns, returning if symptoms worsen, persist, or new symptoms develop, and precautions for potential emergency department visits.

## 2024-10-28 NOTE — Progress Notes (Signed)
 Placed new referral to urgent and placed in the team stat chat

## 2024-10-31 ENCOUNTER — Encounter: Payer: Self-pay | Admitting: Internal Medicine

## 2024-10-31 ENCOUNTER — Other Ambulatory Visit: Payer: Self-pay

## 2024-10-31 ENCOUNTER — Emergency Department (HOSPITAL_COMMUNITY)
Admission: EM | Admit: 2024-10-31 | Discharge: 2024-11-01 | Disposition: A | Source: Ambulatory Visit | Attending: Emergency Medicine | Admitting: Emergency Medicine

## 2024-10-31 ENCOUNTER — Encounter (HOSPITAL_COMMUNITY): Payer: Self-pay

## 2024-10-31 ENCOUNTER — Emergency Department (HOSPITAL_COMMUNITY)

## 2024-10-31 DIAGNOSIS — R509 Fever, unspecified: Secondary | ICD-10-CM | POA: Diagnosis not present

## 2024-10-31 DIAGNOSIS — R1084 Generalized abdominal pain: Secondary | ICD-10-CM | POA: Diagnosis not present

## 2024-10-31 DIAGNOSIS — R197 Diarrhea, unspecified: Secondary | ICD-10-CM | POA: Diagnosis not present

## 2024-10-31 DIAGNOSIS — E876 Hypokalemia: Secondary | ICD-10-CM | POA: Diagnosis not present

## 2024-10-31 DIAGNOSIS — R112 Nausea with vomiting, unspecified: Secondary | ICD-10-CM | POA: Insufficient documentation

## 2024-10-31 DIAGNOSIS — R110 Nausea: Secondary | ICD-10-CM

## 2024-10-31 DIAGNOSIS — K529 Noninfective gastroenteritis and colitis, unspecified: Secondary | ICD-10-CM

## 2024-10-31 DIAGNOSIS — R109 Unspecified abdominal pain: Secondary | ICD-10-CM

## 2024-10-31 LAB — COMPREHENSIVE METABOLIC PANEL WITH GFR
ALT: 19 U/L (ref 0–44)
AST: 26 U/L (ref 15–41)
Albumin: 4.3 g/dL (ref 3.5–5.0)
Alkaline Phosphatase: 141 U/L — ABNORMAL HIGH (ref 38–126)
Anion gap: 14 (ref 5–15)
BUN: 7 mg/dL (ref 6–20)
CO2: 23 mmol/L (ref 22–32)
Calcium: 9.5 mg/dL (ref 8.9–10.3)
Chloride: 103 mmol/L (ref 98–111)
Creatinine, Ser: 0.89 mg/dL (ref 0.44–1.00)
GFR, Estimated: >60 mL/min (ref >60)
Glucose, Bld: 94 mg/dL (ref 70–99)
Potassium: 2.9 mmol/L — ABNORMAL LOW (ref 3.5–5.1)
Sodium: 140 mmol/L (ref 135–145)
Total Bilirubin: 0.3 mg/dL (ref 0.0–1.2)
Total Protein: 7.7 g/dL (ref 6.5–8.1)

## 2024-10-31 LAB — CBC
HCT: 41.6 % (ref 36.0–46.0)
Hemoglobin: 13.3 g/dL (ref 12.0–15.0)
MCH: 27.3 pg (ref 26.0–34.0)
MCHC: 32 g/dL (ref 30.0–36.0)
MCV: 85.4 fL (ref 80.0–100.0)
Platelets: 280 K/uL (ref 150–400)
RBC: 4.87 MIL/uL (ref 3.87–5.11)
RDW: 13.6 % (ref 11.5–15.5)
WBC: 7.9 K/uL (ref 4.0–10.5)
nRBC: 0 % (ref 0.0–0.2)

## 2024-10-31 LAB — LIPASE, BLOOD: Lipase: 23 U/L (ref 11–51)

## 2024-10-31 LAB — URINALYSIS, ROUTINE W REFLEX MICROSCOPIC
Bilirubin Urine: NEGATIVE
Glucose, UA: NEGATIVE mg/dL
Hgb urine dipstick: NEGATIVE
Ketones, ur: 5 mg/dL — AB
Nitrite: NEGATIVE
Protein, ur: 100 mg/dL — AB
Specific Gravity, Urine: 1.029 (ref 1.005–1.030)
pH: 5 (ref 5.0–8.0)

## 2024-10-31 LAB — POC OCCULT BLOOD, ED: Fecal Occult Bld: NEGATIVE

## 2024-10-31 LAB — LACTIC ACID, PLASMA: LACTIC ACID: 1.6 mmol/L (ref 0.4–1.8)

## 2024-10-31 MED ORDER — SODIUM CHLORIDE 0.9 % IV SOLN
12.5000 mg | Freq: Four times a day (QID) | INTRAVENOUS | Status: DC | PRN
  Administered 2024-10-31: 22:00:00 12.5 mg via INTRAVENOUS
  Filled 2024-10-31: qty 12.5

## 2024-10-31 MED ORDER — HYDROMORPHONE HCL 1 MG/ML IJ SOLN
1.0000 mg | Freq: Once | INTRAMUSCULAR | Status: AC
  Administered 2024-10-31: 20:00:00 1 mg via INTRAVENOUS
  Filled 2024-10-31: qty 1

## 2024-10-31 MED ORDER — DICYCLOMINE HCL 10 MG/ML IM SOLN
20.0000 mg | Freq: Once | INTRAMUSCULAR | Status: AC
  Administered 2024-10-31: 21:00:00 20 mg via INTRAMUSCULAR
  Filled 2024-10-31: qty 2

## 2024-10-31 MED ORDER — HYDROMORPHONE HCL 1 MG/ML IJ SOLN
1.0000 mg | Freq: Once | INTRAMUSCULAR | Status: AC
  Administered 2024-10-31: 23:00:00 1 mg via INTRAVENOUS
  Filled 2024-10-31: qty 1

## 2024-10-31 MED ORDER — HYDROMORPHONE HCL 1 MG/ML IJ SOLN
1.0000 mg | Freq: Once | INTRAMUSCULAR | Status: AC
  Administered 2024-10-31: 19:00:00 1 mg via INTRAVENOUS
  Filled 2024-10-31: qty 1

## 2024-10-31 MED ORDER — ONDANSETRON HCL 4 MG/2ML IJ SOLN
4.0000 mg | Freq: Once | INTRAMUSCULAR | Status: AC
  Administered 2024-10-31: 19:00:00 4 mg via INTRAVENOUS
  Filled 2024-10-31: qty 2

## 2024-10-31 MED ORDER — SODIUM CHLORIDE 0.9 % IV BOLUS
1000.0000 mL | Freq: Once | INTRAVENOUS | Status: AC
  Administered 2024-10-31: 19:00:00 1000 mL via INTRAVENOUS

## 2024-10-31 MED ORDER — ONDANSETRON HCL 4 MG/2ML IJ SOLN
4.0000 mg | Freq: Once | INTRAMUSCULAR | Status: AC
  Administered 2024-10-31: 16:00:00 4 mg via INTRAVENOUS
  Filled 2024-10-31: qty 2

## 2024-10-31 MED ORDER — IOHEXOL 300 MG/ML  SOLN
100.0000 mL | Freq: Once | INTRAMUSCULAR | Status: AC | PRN
  Administered 2024-10-31: 20:00:00 100 mL via INTRAVENOUS

## 2024-10-31 NOTE — ED Notes (Signed)
 Assisted patient to the restroom with mother. Patient is going to attempt to leave stool sample.

## 2024-10-31 NOTE — ED Triage Notes (Signed)
 Mid abdominal pain for several weeks, pt sent by PCP. Vomiting for 2.5 weeks. Reports she has had liquid leaking from rectum and it is bloody and dark. Concerned for bowel obstruction, has not had normal BM in a month.

## 2024-10-31 NOTE — ED Provider Notes (Incomplete)
 [place] [facility] DEPARTMENT AT [facility] Provider Note   CSN: [id number] Arrival date & time: 10/31/24  1412     Patient presents with: Abdominal Pain   Diane [name] is a 43 y.o. female with a history of kidney stones gastric ulcer, GERD, cholelithiasis who presents to the ED with abdominal pain that began 3 to 4 weeks ago. The symptoms started with hematochezia that she states filled the toilet with blood.  The patient states that since then, she has had associated vomiting and has not been able to keep any food or hydration down in 2 weeks.  Patient states that now her stools are dark and she stopped seeing the bright blood. Associated symptoms include muscle cramping. The patient reports no ***. There is *** of prior episodes or relevant medical history, including ***. No recent travel. No sick contacts. The patients social history is notable for ***.    {Add pertinent medical, surgical, social history, OB history to HPI:32947}  Abdominal Pain      Prior to Admission medications  Medication Sig Start Date End Date Taking? Authorizing Provider  albuterol  (PROVENTIL  HFA;VENTOLIN  HFA) 108 (90 Base) MCG/ACT inhaler Inhale 2 puffs into the lungs every 6 (six) hours as needed for wheezing or shortness of breath.     [provider]  ALPRAZolam  (XANAX ) 0.25 MG tablet Take 1 Tablet by mouth daily, as needed, use as little as possible and not after 3pm 10/11/24     ALPRAZolam  (XANAX ) 0.5 MG tablet Take 1 tablet (0.5 mg total) by mouth 3 (three) times daily as needed for up to 12 doses for anxiety. 08/31/24   Randol Simmonds, MD  amoxicillin -clavulanate (AUGMENTIN ) 875-125 MG tablet Take 1 tablet by mouth 2 (two) times daily. 10/28/24   Jesus Bernardino MATSU, MD  amphetamine -dextroamphetamine  (ADDERALL XR) 20 MG 24 hr capsule Take 1 capsule (20 mg total) by mouth daily. Patient not taking: Reported on 10/28/2024 09/15/24     atorvastatin  (LIPITOR) 40 MG tablet TAKE 1  TABLET BY MOUTH EVERY DAY 06/03/24   Jesus Bernardino MATSU, MD  Bacillus Coagulans-Inulin (PROBIOTIC) 1-250 BILLION-MG CAPS     [provider]  cetirizine (ZYRTEC) 10 MG tablet Take by mouth. 06/16/17   [provider]  diclofenac  Sodium (VOLTAREN ) 1 % GEL Apply 4 g topically 4 (four) times daily. 07/14/24   Genelle Standing, MD  EMGALITY  120 MG/ML SOAJ Inject 120 mg into the skin every 30 (thirty) days. 06/17/24   Jesus Bernardino MATSU, MD  Fezolinetant  (VEOZAH ) 45 MG TABS Take 1 tablet (45 mg total) by mouth daily at 6 (six) AM. Patient not taking: Reported on 10/19/2024 08/12/24   Jesus Bernardino MATSU, MD  fluconazole  (DIFLUCAN ) 150 MG tablet Take 1 tablet (150 mg total) by mouth every 3 (three) days for yeast infection. 10/28/24   Jesus Bernardino MATSU, MD  fluticasone  (FLONASE ) 50 MCG/ACT nasal spray Place 2 sprays into both nostrils daily. 09/09/23   Jesus Bernardino MATSU, MD  gabapentin  (NEURONTIN ) 300 MG capsule Take 2 capsules (600 mg total) by mouth 3 (three) times daily. 10/28/24 01/26/25  Jesus Bernardino MATSU, MD  hydrOXYzine  (ATARAX ) 50 MG tablet Take 1 tablet (50 mg total) by mouth 3 (three) times daily as needed. 08/31/24   Job Lukes, PA  lisdexamfetamine  (VYVANSE ) 30 MG capsule Take 1 capsule (30 mg total) by mouth daily. 10/11/24     lubiprostone (AMITIZA) 24 MCG capsule TAKE ONE CAPSULE (24 MCG DOSE) BY MOUTH 2 (TWO) TIMES DAILY WITH  MEALS. 10/27/24   Jesus Bernardino MATSU, MD  magnesium  oxide (MAG-OX) 400 MG tablet Take by mouth. 06/16/17   [provider]  meclizine  (ANTIVERT ) 25 MG tablet Take 1 tablet (25 mg total) by mouth 3 (three) times daily as needed for dizziness. 07/30/24   [name], PA-C  MELATONIN GUMMIES PO Take 1-2 each by mouth at bedtime.    [provider]  Multiple Vitamins-Minerals (EQL CENTURY WOMENS) TABS     [provider]  NUCYNTA  100 MG TABS Take 1 tablet by mouth every 8 (eight) hours as needed.    [provider]  omeprazole  (PRILOSEC) 40 MG capsule TAKE 1 (ONE) CAPSUL CAPSULE BY MOUTH DAILY 09/19/24   Jesus Bernardino MATSU, MD  ondansetron  (ZOFRAN -ODT) 8 MG disintegrating tablet Take 1 tablet (8 mg total) by mouth every 8 (eight) hours as needed. 10/28/24   Jesus Bernardino MATSU, MD  progesterone  (PROMETRIUM ) 100 MG capsule Take 3 capsules (300 mg total) by mouth daily. 06/17/24   Jesus Bernardino MATSU, MD  promethazine  (PHENERGAN ) 25 MG tablet Take 1 tablet (25 mg total) by mouth every 8 (eight) hours as needed for nausea or vomiting. 10/19/24   McGowen, Aleene DEL, MD  senna-docusate (SENOKOT-S) 8.6-50 MG tablet Take 1 tablet by mouth daily. 06/20/23   Prosperi, Christian H, PA-C  Tapentadol  HCl (NUCYNTA ) 100 MG TABS Take 1 tablet (100 mg total) by mouth every 8 (eight) hours as needed. 10/11/24     tiZANidine  (ZANAFLEX ) 4 MG tablet Take 4 mg by mouth every 6 (six) hours as needed for muscle spasms.  11/18/16   [provider]  traZODone (DESYREL) 50 MG tablet Take 1-2 tablets by mouth at bedtime as needed. Patient not taking: Reported on 10/28/2024 02/11/24   [provider]  triamcinolone  cream (KENALOG ) 0.1 % APPLY TO AFFECTED AREA TWICE A DAY 08/02/24   Jesus Bernardino MATSU, MD  venlafaxine  XR (EFFEXOR -XR) 150 MG 24 hr capsule TAKE 2 CAPS BY MOUTH DAILY 08/30/24   Jesus Bernardino MATSU, MD  zolmitriptan  (ZOMIG ) 5 MG tablet Take 1 tablet (5 mg total) by mouth as needed for migraine. 08/11/24   Camara, Amadou, MD  zonisamide  (ZONEGRAN ) 100 MG capsule Take 200 mg by mouth at bedtime. 07/18/22   [provider]    Allergies: Droperidol , Iodinated contrast media, Metoclopramide , Nickel, Prochlorperazine, Nsaids, Tolmetin, Oxybutynin , Adhesive [tape], Metoclopramide  hcl, Prednisone , Prochlorperazine edisylate, and Sulfa antibiotics    Review of Systems  Gastrointestinal:  Positive for abdominal pain.    Updated Vital Signs BP (!) 149/102 (BP Location: Right Arm)   Pulse 98   Temp 98.3 F (36.8 C) (Oral)   Resp 20   Ht 5'  7 (1.702 m)   Wt 104.3 kg   LMP 08/26/2018   SpO2 100%   BMI 36.02 kg/m   Physical Exam Vitals and nursing note reviewed.  Constitutional:      General: She is not in acute distress.    Appearance: Normal appearance.  HENT:     Head: Normocephalic and atraumatic.  Eyes:     Extraocular Movements: Extraocular movements intact.     Conjunctiva/sclera: Conjunctivae normal.     Pupils: Pupils are equal, round, and reactive to light.  Cardiovascular:     Rate and Rhythm: Normal rate and regular rhythm.     Pulses: Normal pulses.  Pulmonary:     Effort: Pulmonary effort is normal. No respiratory distress.  Abdominal:     General: Abdomen is flat.  Palpations: Abdomen is soft.     Tenderness: There is abdominal tenderness.     Comments: Diffuse, generalized abdominal tenderness on palpation.  No CVA tenderness, guarding, or rebound.  Musculoskeletal:        General: Normal range of motion.     Cervical back: Normal range of motion.  Skin:    General: Skin is warm and dry.     Capillary Refill: Capillary refill takes less than 2 seconds.  Neurological:     General: No focal deficit present.     Mental Status: She is alert. Mental status is at baseline.  Psychiatric:        Mood and Affect: Mood normal.     (all labs ordered are listed, but only abnormal results are displayed) Labs Reviewed  COMPREHENSIVE METABOLIC PANEL WITH GFR - Abnormal; Notable for the following components:      Result Value   Potassium 2.9 (*)    Alkaline Phosphatase 141 (*)    All other components within normal limits  URINALYSIS, ROUTINE W REFLEX MICROSCOPIC - Abnormal; Notable for the following components:   APPearance HAZY (*)    Ketones, ur 5 (*)    Protein, ur 100 (*)    Leukocytes,Ua SMALL (*)    Bacteria, UA RARE (*)    All other components within normal limits  LIPASE, BLOOD  CBC    EKG: None  Radiology: No results found.  {Document cardiac monitor, telemetry assessment  procedure when appropriate:32947} Procedures   Medications Ordered in the ED  sodium chloride  0.9 % bolus 1,000 mL (has no administration in time range)  ondansetron  (ZOFRAN ) injection 4 mg (4 mg Intravenous Given 10/31/24 1554)    Clinical Course as of 10/31/24 1834  Mon Oct 31, 2024  1834 Temp: 98.3 F (36.8 C) Febrile, vital stable, patient no acute distress [ML]  1834 Urinalysis, Routine w reflex microscopic -Urine, Clean Catch(!) Unremarkable [ML]  1834 Comprehensive metabolic panel(!) Hypokalemia [ML]  1834 CBC Unremarkable [ML]    Clinical Course User Index [ML] Willma Duwaine CROME, PA   {Click here for ABCD2, HEART and other calculators REFRESH Note before signing:1}                              Medical Decision Making Amount and/or Complexity of Data Reviewed Labs: ordered. Decision-making details documented in ED Course.   ***  {Document critical care time when appropriate  Document review of labs and clinical decision tools ie CHADS2VASC2, etc  Document your independent review of radiology images and any outside records  Document your discussion with family members, caretakers and with consultants  Document social determinants of health affecting pt's care  Document your decision making why or why not admission, treatments were needed:32947:::1}   Final diagnoses:  None    ED Discharge Orders     None

## 2024-11-01 ENCOUNTER — Ambulatory Visit: Payer: Self-pay | Admitting: Internal Medicine

## 2024-11-01 ENCOUNTER — Telehealth (HOSPITAL_BASED_OUTPATIENT_CLINIC_OR_DEPARTMENT_OTHER): Payer: Self-pay | Admitting: Emergency Medicine

## 2024-11-01 ENCOUNTER — Other Ambulatory Visit (HOSPITAL_BASED_OUTPATIENT_CLINIC_OR_DEPARTMENT_OTHER): Payer: Self-pay

## 2024-11-01 ENCOUNTER — Other Ambulatory Visit: Payer: Self-pay

## 2024-11-01 ENCOUNTER — Encounter: Payer: Self-pay | Admitting: Internal Medicine

## 2024-11-01 ENCOUNTER — Ambulatory Visit: Admitting: Neurosurgery

## 2024-11-01 ENCOUNTER — Encounter: Payer: Self-pay | Admitting: Neurosurgery

## 2024-11-01 ENCOUNTER — Inpatient Hospital Stay: Admission: RE | Admit: 2024-11-01

## 2024-11-01 VITALS — BP 133/85 | HR 87 | Temp 98.0°F | Ht 67.0 in | Wt 233.0 lb

## 2024-11-01 DIAGNOSIS — M436 Torticollis: Secondary | ICD-10-CM | POA: Diagnosis not present

## 2024-11-01 DIAGNOSIS — R519 Headache, unspecified: Secondary | ICD-10-CM | POA: Diagnosis not present

## 2024-11-01 DIAGNOSIS — H539 Unspecified visual disturbance: Secondary | ICD-10-CM

## 2024-11-01 DIAGNOSIS — H9190 Unspecified hearing loss, unspecified ear: Secondary | ICD-10-CM | POA: Diagnosis not present

## 2024-11-01 DIAGNOSIS — G932 Benign intracranial hypertension: Secondary | ICD-10-CM

## 2024-11-01 DIAGNOSIS — H93A2 Pulsatile tinnitus, left ear: Secondary | ICD-10-CM

## 2024-11-01 LAB — CLOSTRIDIUM DIFFICILE BY PCR, REFLEXED
Hypervirulent Strain: NEGATIVE
Toxigenic C. Difficile by PCR: POSITIVE — AB

## 2024-11-01 LAB — GASTROINTESTINAL PANEL BY PCR, STOOL (REPLACES STOOL CULTURE)

## 2024-11-01 LAB — C DIFFICILE QUICK SCREEN W PCR REFLEX
C Diff antigen: POSITIVE — AB
C Diff toxin: NEGATIVE

## 2024-11-01 MED ORDER — POTASSIUM CHLORIDE CRYS ER 20 MEQ PO TBCR
20.0000 meq | EXTENDED_RELEASE_TABLET | Freq: Every day | ORAL | 0 refills | Status: DC
  Filled 2024-11-01: qty 5, 5d supply, fill #0

## 2024-11-01 MED ORDER — VANCOMYCIN HCL 125 MG PO CAPS
125.0000 mg | ORAL_CAPSULE | Freq: Four times a day (QID) | ORAL | 0 refills | Status: AC
  Filled 2024-11-01: qty 40, 10d supply, fill #0

## 2024-11-01 MED ORDER — ZONISAMIDE 25 MG PO CAPS
25.0000 mg | ORAL_CAPSULE | Freq: Three times a day (TID) | ORAL | 3 refills | Status: AC
  Filled 2024-11-01: qty 90, 30d supply, fill #0

## 2024-11-01 MED ORDER — PROMETHAZINE HCL 25 MG PO TABS
25.0000 mg | ORAL_TABLET | Freq: Three times a day (TID) | ORAL | 0 refills | Status: AC | PRN
  Filled 2024-11-01: qty 10, 4d supply, fill #0

## 2024-11-01 NOTE — Progress Notes (Signed)
 Following an increase in zonisamide  from 100mg  to 200mg  on 10/24 the patient reports that she felt significantly improved in early to mid November. Toward the end of November, she began experiencing an increase in headache frequency to approximately every other day. She describes the headaches as a band-like pressure involving the forehead and the posterior aspect of the head. Symptoms are reported to be worse at night, with improvement when lying down. She also reports pulsatile tinnitus in the left ear when bending over, along with muffled hearing and autophony described as hearing an echo of her own voice.  She also reports neck stiffness, pain behind the ear, nausea, and generalized worsening of vision in both eyes. She denies floaters, spots, or photopsias.  She is uncertain about the exact timing, but reports developing nausea and vomiting with an associated unintentional 25lbs loss, which she believes began approximately three weeks ago. On 12/12, she presented to her PCP for nausea, vomiting, hematochezia, and symptoms of a sinus infection. At that visit, lab studies and an abdominal Xray were obtained. She was also started on Augmentin . Due to concern for possible bowel obstruction, she was referred to [facility]. After reviewing the imaging, the surgical team recommended she go to the ER. On 12/15 she was diagnosed with Cdiff.   I had a long conversation with her and her family.  I think that she, even though inadvertently, has lost weight and she is overmedicated.  I would like for her to go down to 275 mg of zonisamide  a day and have given her prescription for 25 mg capsules.  I also think that she needs to be seen by a professional weight loss team and warned her against going back to her old habits and gaining weight.  She pledged to watch what she eats and exercise.  I will see her back in 2 months.  She was understanding that at 175 is still too much, that she is okay to go down  to 150.

## 2024-11-01 NOTE — Discharge Instructions (Signed)
 Thank you for visiting the Emergency Department today. It was a pleasure to be part of your healthcare team.  Your workup today was reassuring, and your CT showed signs of possible infectious colitis.  Your stool samples are still pending.  As discussed, please follow-up with gastroenterology for further evaluation and care.  At home rest, hydrate, eat bland foods until you are able to tolerate further.  It is important to watch for warning signs such as worsening pain or fever. If any of these happen, return to the Emergency Department or call 911. Thank you for trusting us  with your health.

## 2024-11-01 NOTE — Progress Notes (Signed)
 Reviewed ER notification. Patient seen in Emergency Department yesterday and C. Diff returned positive - Dr. Lenor started on PO vancomycin , potassium repleted, phenergan  sent. Patient instructed to follow up with PCP and GI. We need to work her in for follow-up as scheduled; please arrange priority scheduling / cancellation list scheduling.  Education Administrator Visit (Marketing Executive) is ok, goal in 5-10 days .  I predict she will do well with vancomycin  and can cancel surgery referral IF she does well.

## 2024-11-01 NOTE — Telephone Encounter (Signed)
 Patient C. difficile came back positive from yesterday.  Will start her on oral vancomycin .  I also noticed that her potassium is low in the ED.  Will start her on some potassium supplementation and she request a prescription for Phenergan  as she is almost out.  I did notify the patient of these results and that she will need to follow-up with her primary care doctor to make sure that she is improving and also to have her potassium rechecked.  She is also going to contact the gastroenterologist that she was given information about yesterday to get a follow-up appointment.

## 2024-11-02 ENCOUNTER — Telehealth: Payer: Self-pay

## 2024-11-02 NOTE — Telephone Encounter (Signed)
 Spoke with pt about vancomycin   also pt is asking should she countine the amoxillin with the that? Pt is still having a lot of pain and would like to know how long does this last.  Copied from CRM #[id number]. Topic: General - Other >> Nov 01, 2024  1:35 PM Diane [name] wrote: Reason for CRM: patient called stating she went to the ER last night and need to go over the treatments with the nurse. She need to know what to do after she received some test that came back.  Patient want to know if c.diff is contagious   [phone] ----------------------------------------------------------------------- From previous Reason for Contact - Red Word Triage: Red Word that prompted transfer to Nurse Triage: er last night and need to  go over the  treaments with the nurse

## 2024-11-02 NOTE — Progress Notes (Signed)
 Sent message to admin to make appt asap if possible

## 2024-11-02 NOTE — Telephone Encounter (Signed)
 Copied from CRM #[id number]. Topic: General - Other >> Nov 02, 2024 10:16 AM Laymon HERO wrote: Reason for CRM: Patient asking for [facility], CMA to return call- she said that she has more questions on the phone call from earlier that she wanted to discuss  Spoke with pt and have another note about this

## 2024-11-07 ENCOUNTER — Encounter: Payer: Self-pay | Admitting: Internal Medicine

## 2024-11-07 ENCOUNTER — Telehealth: Admitting: Internal Medicine

## 2024-11-07 DIAGNOSIS — G932 Benign intracranial hypertension: Secondary | ICD-10-CM

## 2024-11-07 DIAGNOSIS — M6250 Muscle wasting and atrophy, not elsewhere classified, unspecified site: Secondary | ICD-10-CM | POA: Diagnosis not present

## 2024-11-07 DIAGNOSIS — E669 Obesity, unspecified: Secondary | ICD-10-CM

## 2024-11-07 DIAGNOSIS — E878 Other disorders of electrolyte and fluid balance, not elsewhere classified: Secondary | ICD-10-CM | POA: Diagnosis not present

## 2024-11-07 DIAGNOSIS — T368X5A Adverse effect of other systemic antibiotics, initial encounter: Secondary | ICD-10-CM

## 2024-11-07 DIAGNOSIS — A0472 Enterocolitis due to Clostridium difficile, not specified as recurrent: Secondary | ICD-10-CM

## 2024-11-07 DIAGNOSIS — G4733 Obstructive sleep apnea (adult) (pediatric): Secondary | ICD-10-CM | POA: Diagnosis not present

## 2024-11-07 DIAGNOSIS — R110 Nausea: Secondary | ICD-10-CM

## 2024-11-07 DIAGNOSIS — R809 Proteinuria, unspecified: Secondary | ICD-10-CM | POA: Diagnosis not present

## 2024-11-07 DIAGNOSIS — R8290 Unspecified abnormal findings in urine: Secondary | ICD-10-CM

## 2024-11-07 MED ORDER — FIDAXOMICIN 200 MG PO TABS: 200.0000 mg | ORAL_TABLET | Freq: Two times a day (BID) | ORAL | 0 refills | Status: AC

## 2024-11-07 MED ORDER — POTASSIUM CHLORIDE CRYS ER 20 MEQ PO TBCR: 20.0000 meq | EXTENDED_RELEASE_TABLET | Freq: Every day | ORAL | 3 refills | Status: AC

## 2024-11-07 MED ORDER — WEGOVY 0.25 MG/0.5ML ~~LOC~~ SOAJ: 0.2500 mg | SUBCUTANEOUS | 2 refills | Status: DC

## 2024-11-07 NOTE — Assessment & Plan Note (Signed)
 Rapid weight loss with muscle wasting   Rapid weight loss has led to low intracranial pressures and muscle wasting due to inadequate nutrition and illness. Maintaining muscle mass is important, and physical therapy may be needed post-recovery. Nutritional intake with [employer] is encouraged.

## 2024-11-07 NOTE — Assessment & Plan Note (Signed)
 Shared decision-making done; patient understood rationale and agreed to [facility]

## 2024-11-07 NOTE — Progress Notes (Signed)
 ====================================  [facility]: [phone]   --  Virtual Video Medical Office Visit --  Patient: Diane [name]      Age: 43 y.o.       Sex:  female  Date:   11/07/2024 Today's Healthcare Provider: Bernardino KANDICE Cone, MD  ====================================    Chief Complaint/Reason For Visit: Hospitalization Follow-up Emergency room follow up C.diff AND wants weight loss medication(s).    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), Choking, Cholelithiasis and acute cholecystitis without obstruction (12/02/2016), Chronic cough (12/27/2013), Depression, Dry mouth (08/12/2017), Dysmenorrhea, Family history of adverse reaction to anesthesia, Gastric ulcer (03/16/2015), GERD (gastroesophageal reflux disease), Headache (07/09/2007), History of gastric ulcer, History of kidney stones, Hyperlipidemia, IBS (irritable bowel syndrome), Lower  extremity edema (07/26/2021), Migraines, Mild obstructive sleep apnea, Nausea and vomiting (12/03/2016), OAB (overactive bladder) (08/12/2017), Osteoarthritis, Osteochondral lesion (09/2016), Osteomyelitis of knee region [facility]), Peripheral vascular disease, Plantar fasciitis of left foot, Pleural effusion (12/15/2013), PONV (postoperative nausea and vomiting), Pseudotumor cerebri, RUQ pain, S/P dilatation of esophageal stricture (07/2018), Sinusitis (08/12/2018), SUI (stress urinary incontinence, female), Thunderclap headache (08/17/2024), Wears contact lenses, and Wears partial dentures. Discussed the use of AI scribe software for clinical note transcription with the patient, who gave verbal consent to proceed.  History of Present Illness History of Present Illness The patient, with C. diff colitis, presents with abdominal pain and nausea.  They experience persistent abdominal pain and nausea, described as constant and aching, which worsens with eating. They can only consume small amounts of food, such as saltine crackers, due to the pain and nausea, and feel an impending sense of vomiting after eating more than a few bites.  They are currently on vancomycin  for C. diff colitis, which has reduced diarrhea but not the abdominal pain and nausea. They take Phenergan  at night and Zofran  during the day to manage nausea but continue to struggle with maintaining adequate nutrition. They have mushy bowel movements every morning, indicating ongoing gastrointestinal issues. They report being able to take medications and fluids. Potassium supplements are being taken due to previously low potassium levels identified in the ER.  They have experienced rapid weight loss, leading to low blood pressure issues. They are not currently on blood pressure medications but have had adjustments to their diuretic therapy. Concerned about muscle loss due to inadequate nutrition, they are considering nutritional supplements like  Premier protein drinks.  They have a history of pseudotumor cerebri, previously associated with weight gain, and are scheduled to see a weight loss and nutrition specialist to manage their weight in a healthy manner. No passing out, no solid bowel movements, only mushy ones. They confirm regular urination and the ability to hold down medications  and fluids.   Medications reviewed: Current Outpatient Medications on File Prior to Visit  Medication Sig   albuterol  (PROVENTIL  HFA;VENTOLIN  HFA) 108 (90 Base) MCG/ACT inhaler Inhale 2 puffs into the lungs every 6 (six) hours as needed for wheezing or shortness of breath.    ALPRAZolam  (XANAX ) 0.25 MG tablet Take 1 Tablet by mouth daily, as needed, use as little as possible and not after 3pm   atorvastatin  (LIPITOR) 40 MG tablet TAKE 1 TABLET BY MOUTH EVERY DAY   Bacillus Coagulans-Inulin (PROBIOTIC) 1-250 BILLION-MG CAPS    cetirizine (ZYRTEC) 10 MG tablet Take by mouth.   diclofenac  Sodium (VOLTAREN ) 1 % GEL Apply 4 g topically 4 (four) times daily.   EMGALITY  120 MG/ML SOAJ Inject 120 mg into the skin every 30 (thirty) days.   Fezolinetant  (VEOZAH ) 45 MG TABS Take 1 tablet (45 mg total) by mouth daily at 6 (six) AM.   fluconazole  (DIFLUCAN ) 150 MG tablet Take 1 tablet (150 mg total) by mouth every 3 (three) days for yeast infection.   fluticasone  (FLONASE ) 50 MCG/ACT nasal spray Place 2 sprays into both nostrils daily.   gabapentin  (NEURONTIN ) 300 MG capsule Take 2 capsules (600 mg total) by mouth 3 (three) times daily.   lisdexamfetamine  (VYVANSE ) 30 MG capsule Take 1 capsule (30 mg total) by mouth daily.   lubiprostone (AMITIZA) 24 MCG capsule TAKE ONE CAPSULE (24 MCG DOSE) BY MOUTH 2 (TWO) TIMES DAILY WITH MEALS.   magnesium  oxide (MAG-OX) 400 MG tablet Take by mouth.   meclizine  (ANTIVERT ) 25 MG tablet Take 1 tablet (25 mg total) by mouth 3 (three) times daily as needed for dizziness.   Multiple Vitamins-Minerals (EQL [facility]) TABS     NUCYNTA  100 MG TABS Take 1 tablet by mouth every 8 (eight) hours as needed.   omeprazole (PRILOSEC) 40 MG capsule TAKE 1 (ONE) CAPSUL CAPSULE BY MOUTH DAILY   ondansetron  (ZOFRAN -ODT) 8 MG disintegrating tablet Take 1 tablet (8 mg total) by mouth every 8 (eight) hours as needed.   progesterone  (PROMETRIUM ) 100 MG capsule Take 3 capsules (300 mg total) by mouth daily.   promethazine  (PHENERGAN ) 25 MG tablet Take 1 tablet (25 mg total) by mouth every 8 (eight) hours as needed for nausea or vomiting.   senna-docusate (SENOKOT-S) 8.6-50 MG tablet Take 1 tablet by mouth daily.   Tapentadol  HCl (NUCYNTA ) 100 MG TABS Take 1 tablet (100 mg total) by mouth every 8 (eight) hours as needed.   tiZANidine  (ZANAFLEX ) 4 MG tablet Take 4 mg by mouth every 6 (six) hours as needed for muscle spasms.    traZODone (DESYREL) 50 MG tablet Take 1-2 tablets by mouth at bedtime as needed.   triamcinolone  cream (KENALOG ) 0.1 % APPLY TO AFFECTED AREA TWICE A DAY   vancomycin  (VANCOCIN ) 125 MG capsule Take 1 capsule (125 mg total) by mouth 4 (four) times daily for 10 days.   venlafaxine  XR (EFFEXOR -XR) 150 MG 24 hr capsule TAKE 2 CAPS BY MOUTH DAILY   zolmitriptan  (ZOMIG ) 5 MG tablet Take 1 tablet (5 mg total) by mouth as needed for migraine.   zonisamide  (ZONEGRAN ) 100 MG capsule Take 200 mg by mouth at bedtime.   zonisamide  (ZONEGRAN ) 25 MG capsule Take 1 capsule (25 mg total) by mouth in the morning, at noon, and at bedtime.   Current Facility-Administered Medications on File Prior to Visit  Medication   Erenumab -aooe SOAJ 140 mg   Medications Discontinued During This Encounter  Medication Reason   amoxicillin -clavulanate (  AUGMENTIN ) 875-125 MG tablet    MELATONIN GUMMIES PO    hydrOXYzine  (ATARAX ) 50 MG tablet    ALPRAZolam  (XANAX ) 0.5 MG tablet    amphetamine -dextroamphetamine  (ADDERALL XR) 20 MG 24 hr capsule    potassium chloride  SA (KLOR-CON  M) 20 MEQ tablet Reorder       Virtual Physical Exam General  Appearance:  Well Developed, Well Nourished, No Acute Distress by Limited Video Assessment Pulmonary:  No Respiratory Distress Apparent. Normal Work of Breathing.   Neurological:  Awake, Alert. No Obvious Focal Neurological Deficits or Cognitive Impairments.  Sensorium Seems Unclouded. Psychiatric:  Appropriate Mood, Pleasant Demeanor, Calm, Articulate, Good Mood        No results found for any visits on 11/07/24. Admission on 10/31/2024, Discharged on 11/01/2024  Component Date Value   Lipase 10/31/2024 23    Sodium 10/31/2024 140    Potassium 10/31/2024 2.9 (L)    Chloride 10/31/2024 103    CO2 10/31/2024 23    Glucose, Bld 10/31/2024 94    BUN 10/31/2024 7    Creatinine, Ser 10/31/2024 0.89    Calcium  10/31/2024 9.5    Total Protein 10/31/2024 7.7    Albumin 10/31/2024 4.3    AST 10/31/2024 26    ALT 10/31/2024 19    Alkaline Phosphatase 10/31/2024 141 (H)    Total Bilirubin 10/31/2024 0.3    GFR, Estimated 10/31/2024 >60    Anion gap 10/31/2024 14    WBC 10/31/2024 7.9    RBC 10/31/2024 4.87    Hemoglobin 10/31/2024 13.3    HCT 10/31/2024 41.6    MCV 10/31/2024 85.4    MCH 10/31/2024 27.3    MCHC 10/31/2024 32.0    RDW 10/31/2024 13.6    Platelets 10/31/2024 280    nRBC 10/31/2024 0.0    Color, Urine 10/31/2024 YELLOW    APPearance 10/31/2024 HAZY (A)    Specific Gravity, Urine 10/31/2024 1.029    pH 10/31/2024 5.0    Glucose, UA 10/31/2024 NEGATIVE    Hgb urine dipstick 10/31/2024 NEGATIVE    Bilirubin Urine 10/31/2024 NEGATIVE    Ketones, ur 10/31/2024 5 (A)    Protein, ur 10/31/2024 100 (A)    Nitrite 10/31/2024 NEGATIVE    Leukocytes,Ua 10/31/2024 SMALL (A)    RBC / HPF 10/31/2024 0-5    WBC, UA 10/31/2024 11-20    Bacteria, UA 10/31/2024 RARE (A)    Squamous Epithelial / HPF 10/31/2024 21-50    Mucus 10/31/2024 PRESENT    Hyaline Casts, UA 10/31/2024 PRESENT    Ca Oxalate Crys, UA 10/31/2024 PRESENT    Fecal Occult Bld 10/31/2024 NEGATIVE     Campylobacter species 10/31/2024 NOT DETECTED    Plesimonas shigelloides 10/31/2024 NOT DETECTED    Salmonella species 10/31/2024 NOT DETECTED    Yersinia enterocolitica 10/31/2024 NOT DETECTED    Vibrio species 10/31/2024 NOT DETECTED    Vibrio cholerae 10/31/2024 NOT DETECTED    Enteroaggregative E coli* 10/31/2024 NOT DETECTED    Enteropathogenic E coli * 10/31/2024 NOT DETECTED    Enterotoxigenic E coli (* 10/31/2024 NOT DETECTED    Shiga like toxin produci* 10/31/2024 NOT DETECTED    Shigella/Enteroinvasive * 10/31/2024 NOT DETECTED    Cryptosporidium 10/31/2024 NOT DETECTED    Cyclospora cayetanensis 10/31/2024 NOT DETECTED    Entamoeba histolytica 10/31/2024 NOT DETECTED    Giardia lamblia 10/31/2024 NOT DETECTED    Adenovirus F40/41 10/31/2024 NOT DETECTED    Astrovirus 10/31/2024 NOT DETECTED    Norovirus GI/GII 10/31/2024 NOT DETECTED  Rotavirus A 10/31/2024 NOT DETECTED    Sapovirus (I, II, IV, an* 10/31/2024 NOT DETECTED    C Diff antigen 10/31/2024 POSITIVE (A)    C Diff toxin 10/31/2024 NEGATIVE    C Diff interpretation 10/31/2024 Results are indeterminate. See PCR results.    Toxigenic C. Difficile b* 10/31/2024 POSITIVE (A)    Hypervirulent Strain 10/31/2024 PRESUMPTIVE NEGATIVE   Lab on 10/27/2024  Component Date Value   LACTIC ACID 10/27/2024 1.6    Lipase 10/27/2024 22.0    Amylase 10/27/2024 24 (L)    Sodium 10/27/2024 138    Potassium 10/27/2024 3.1 (L)    Chloride 10/27/2024 102    CO2 10/27/2024 24    Glucose, Bld 10/27/2024 107 (H)    BUN 10/27/2024 9    Creatinine, Ser 10/27/2024 0.89    Total Bilirubin 10/27/2024 0.3    Alkaline Phosphatase 10/27/2024 123 (H)    AST 10/27/2024 24    ALT 10/27/2024 19    Total Protein 10/27/2024 7.5    Albumin 10/27/2024 4.4    GFR 10/27/2024 80.10    Calcium  10/27/2024 9.5    WBC 10/27/2024 5.9    RBC 10/27/2024 4.54    Hemoglobin 10/27/2024 12.4    HCT 10/27/2024 37.8    MCV 10/27/2024 83.3    MCHC  10/27/2024 32.8    RDW 10/27/2024 14.1    Platelets 10/27/2024 247.0    Neutrophils Relative % 10/27/2024 58.9    Lymphocytes Relative 10/27/2024 29.4    Monocytes Relative 10/27/2024 7.8    Eosinophils Relative 10/27/2024 2.9    Basophils Relative 10/27/2024 1.0    Neutro Abs 10/27/2024 3.5    Lymphs Abs 10/27/2024 1.7    Monocytes Absolute 10/27/2024 0.5    Eosinophils Absolute 10/27/2024 0.2    Basophils Absolute 10/27/2024 0.1   Office Visit on 10/19/2024  Component Date Value   Influenza A, POC 10/19/2024 Negative    Influenza B, POC 10/19/2024 Negative    SARS Coronavirus 2 Ag 10/19/2024 Negative   Admission on 09/01/2024, Discharged on 09/01/2024  Component Date Value   WBC 09/01/2024 10.1    RBC 09/01/2024 4.47    Hemoglobin 09/01/2024 12.3    HCT 09/01/2024 38.4    MCV 09/01/2024 85.9    MCH 09/01/2024 27.5    MCHC 09/01/2024 32.0    RDW 09/01/2024 14.3    Platelets 09/01/2024 310    nRBC 09/01/2024 0.0    Neutrophils Relative % 09/01/2024 90    Neutro Abs 09/01/2024 9.1 (H)    Lymphocytes Relative 09/01/2024 6    Lymphs Abs 09/01/2024 0.6 (L)    Monocytes Relative 09/01/2024 4    Monocytes Absolute 09/01/2024 0.4    Eosinophils Relative 09/01/2024 0    Eosinophils Absolute 09/01/2024 0.0    Basophils Relative 09/01/2024 0    Basophils Absolute 09/01/2024 0.0    Immature Granulocytes 09/01/2024 0    Abs Immature Granulocytes 09/01/2024 0.04    Sodium 09/01/2024 138    Potassium 09/01/2024 3.7    Chloride 09/01/2024 108    CO2 09/01/2024 18 (L)    Glucose, Bld 09/01/2024 143 (H)    BUN 09/01/2024 9    Creatinine, Ser 09/01/2024 1.01 (H)    Calcium  09/01/2024 9.3    GFR, Estimated 09/01/2024 >60    Anion gap 09/01/2024 12   Admission on 07/29/2024, Discharged on 07/30/2024  Component Date Value   Sodium 07/29/2024 136    Potassium 07/29/2024 3.8    Chloride 07/29/2024 101  CO2 07/29/2024 21 (L)    Glucose, Bld 07/29/2024 106 (H)    BUN 07/29/2024 7     Creatinine, Ser 07/29/2024 0.86    Calcium  07/29/2024 9.1    GFR, Estimated 07/29/2024 >60    Anion gap 07/29/2024 14    WBC 07/29/2024 8.3    RBC 07/29/2024 4.49    Hemoglobin 07/29/2024 12.1    HCT 07/29/2024 38.7    MCV 07/29/2024 86.2    MCH 07/29/2024 26.9    MCHC 07/29/2024 31.3    RDW 07/29/2024 14.6    Platelets 07/29/2024 362    nRBC 07/29/2024 0.0    Neutrophils Relative % 07/29/2024 54    Neutro Abs 07/29/2024 4.4    Lymphocytes Relative 07/29/2024 35    Lymphs Abs 07/29/2024 3.0    Monocytes Relative 07/29/2024 6    Monocytes Absolute 07/29/2024 0.5    Eosinophils Relative 07/29/2024 4    Eosinophils Absolute 07/29/2024 0.4    Basophils Relative 07/29/2024 1    Basophils Absolute 07/29/2024 0.1    Immature Granulocytes 07/29/2024 0    Abs Immature Granulocytes 07/29/2024 0.02   Office Visit on 03/02/2024  Component Date Value   D-Dimer, Quant 03/02/2024 1.69 (H)   Admission on 02/29/2024, Discharged on 03/01/2024  Component Date Value   Sodium 02/29/2024 139    Potassium 02/29/2024 3.5    Chloride 02/29/2024 103    CO2 02/29/2024 26    Glucose, Bld 02/29/2024 112 (H)    BUN 02/29/2024 9    Creatinine, Ser 02/29/2024 0.71    Calcium  02/29/2024 8.8 (L)    GFR, Estimated 02/29/2024 >60    Anion gap 02/29/2024 10    WBC 02/29/2024 8.9    RBC 02/29/2024 4.48    Hemoglobin 02/29/2024 12.6    HCT 02/29/2024 38.6    MCV 02/29/2024 86.2    MCH 02/29/2024 28.1    MCHC 02/29/2024 32.6    RDW 02/29/2024 13.9    Platelets 02/29/2024 313    nRBC 02/29/2024 0.0    Troponin I (High Sensiti* 02/29/2024 3    TSH 02/29/2024 1.090   Office Visit on 02/02/2024  Component Date Value   Albumin 02/02/2024 4.1    Dehydroepiandrosterone 02/02/2024 38    LH 02/02/2024 34.9    FSH 02/02/2024 24.3    Prolactin 02/02/2024 16.9    Progesterone  02/02/2024 0.3    Estrogen 02/02/2024 276    Sex Hormone Binding 02/02/2024 31.8    Vit D, 25-Hydroxy 02/02/2024 44.8   No  image results found. CT ABDOMEN PELVIS W CONTRAST Result Date: 10/31/2024 EXAM: CT ABDOMEN AND PELVIS WITH CONTRAST 10/31/2024 08:05:39 PM TECHNIQUE: CT of the abdomen and pelvis was performed with the administration of 100 mL of iohexol  (OMNIPAQUE ) 300 MG/ML solution. Multiplanar reformatted images are provided for review. Automated exposure control, iterative reconstruction, and/or weight-based adjustment of the mA/kV was utilized to reduce the radiation dose to as low as reasonably achievable. COMPARISON: Abdominal radiographs 10/28/2024 and CT abdomen and pelvis 06/20/2023. CLINICAL HISTORY: Abdominal pain, acute, nonlocalized. Vomiting for 2.5 weeks. Bloody discharge per rectum. Abnormal bowel movements. FINDINGS: LOWER CHEST: Lung bases are clear. LIVER: The liver is unremarkable. GALLBLADDER AND BILE DUCTS: Surgical absence of the gallbladder. No biliary ductal dilatation. SPLEEN: The spleen is unremarkable. PANCREAS: The pancreas is unremarkable. ADRENAL GLANDS: The adrenal glands are unremarkable. KIDNEYS, URETERS AND BLADDER: The kidneys are unremarkable. No stones in the kidneys or ureters. No hydronephrosis. No perinephric or periureteral stranding. The bladder is decompressed.  GI AND BOWEL: Stomach and small bowel are decompressed. Liquid stool demonstrated throughout the colon without wall thickening or inflammatory stranding. No colonic distention. The appendix is normal. PERITONEUM AND RETROPERITONEUM: No ascites. No free air. VASCULATURE: The abdominal aorta is unremarkable. LYMPH NODES: Retroperitoneal lymph nodes are unremarkable. REPRODUCTIVE ORGANS: No acute abnormality. BONES AND SOFT TISSUES: No acute osseous abnormality. No focal soft tissue abnormality. IMPRESSION: 1. Fluid throughout the colon consistent with liquid stool suggesting possible infectious colitis. No wall thickening or obstruction. Electronically signed by: Elsie Gravely MD 10/31/2024 08:15 PM EST RP Workstation:  [id number]   DG Abd 2 Views Result Date: 10/31/2024 CLINICAL DATA:  Generalized abdominal pain. Gastrointestinal hemorrhage. EXAM: ABDOMEN - 2 VIEW COMPARISON:  Abdominopelvic CT 06/20/2023 FINDINGS: No free intra-abdominal air. No bowel dilatation or evidence of obstruction. Few bowel air-fluid levels are seen in the right upper quadrant. Small volume of formed stool in the colon. Rounded densities in the right abdomen likely retained enteric contents. There are multiple pelvic phleboliths. Cholecystectomy clips in the right upper quadrant. No visible radiopaque calculi. No acute osseous findings. IMPRESSION: Few bowel air-fluid levels in the right upper quadrant, can be seen with enteritis or diarrheal process. No bowel obstruction or free air. Electronically Signed   By: Andrea Gasman M.D.   On: 10/31/2024 11:39   DG INJECT DIAG/THERA/INC NEEDLE/CATH/PLC EPI/LUMB/SAC W/IMG Result Date: 08/30/2024 CLINICAL DATA:  43 year old female with history of persistent headache after lumbar puncture on 08/26/2024. EXAM: DG INJECT/THERA INC NEEDLE/CATH/PLC EPI/LUMB/SAC W/IMG PROCEDURE: The procedure, risks, benefits, and alternatives were explained to the patient. Questions regarding the procedure were encouraged and answered. The patient understands and consents to the procedure. LUMBAR EPIDURAL BLOOD PATCH: 15 ml of blood were withdrawn from the patient's antecubital fossa. An epidural approach was taken on the right at L3-L4 using a 20 gauge epidural needle. Epidural positioning was confirmed by injecting a small amount of 1.62ml Isovue -M 200. There was no vascular communication. Five ml of the patient's blood was slowly injected into the epidural space in this location. The procedure was well-tolerated and she was discharged in good condition with instructions to lie down for additional day. IMPRESSION: Lumbar epidural blood patch on the right at L3-L4. Ester Sides, MD [facility] Electronically Signed   By: Ester Sides M.D.   On: 08/30/2024 12:12   MR BRAIN WO CONTRAST Result Date: 08/26/2024 EXAM: MRI BRAIN WITHOUT CONTRAST 08/22/2024 10:47:11 AM TECHNIQUE: Multiplanar multisequence MRI of the head/brain was performed without the administration of intravenous contrast. COMPARISON: CTA head and neck 07/29/2024. MRI head 04/13/2008. CLINICAL HISTORY: Headache, neuro deficit, dizziness for 3 weeks. Evaluation for Chiari malformation. FINDINGS: BRAIN AND VENTRICLES: There is no evidence of an acute infarct, intracranial hemorrhage, mass, midline shift, hydrocephalus, or extra-axial fluid collection. Cerebral volume is normal. The ventricles are normal in size. The cerebellar tonsils are normally positioned. The brain is normal in signal. The sella is unremarkable. Normal flow voids. ORBITS: No acute abnormality. SINUSES AND MASTOIDS: No acute abnormality. BONES AND SOFT TISSUES: Normal marrow signal. No acute soft tissue abnormality. IMPRESSION: 1. Negative head MRI. Electronically signed by: Dasie Hamburg MD 08/26/2024 12:24 PM EDT RP Workstation: [id number]   MR Venogram Head Result Date: 08/26/2024 EXAM: MRV BRAIN 08/25/2024 05:23:46 PM TECHNIQUE: Multiplanar multisequence MRV of the head was performed with and without the administration of 10 ml of Vueway  intravenous contrast. Multiplanar 2D and 3D reformatted images are provided for review. COMPARISON: CTA head and  neck 07/29/2024 CLINICAL HISTORY: idiopathic intracranial hypertension. FINDINGS: Stenoses of the transverse-sigmoid sinus junctions appear moderate to severe on the recent prior CTA but are less conspicuous on this MRI. There is no dural venous sinus thrombosis. IMPRESSION: 1. No dural venous sinus thrombosis. 2. Bilateral transversesigmoid sinus junction stenoses, more apparent on the prior CTA. Electronically signed by: Dasie Hamburg MD 08/26/2024 12:20 PM EDT RP Workstation: [id number]    DG FL GUIDED LUMBAR PUNCTURE Result Date: 08/26/2024 EXAM: FLUOROSCOPIC GUIDED LUMBAR PUNCTURE 08/26/2024 11:11:27 AM FLUOROSCOPY DOSE AND TYPE: Radiation Dose Index: Reference Air Kerma (in mGy) = 2.60 COMPARISON: None available CLINICAL HISTORY: IIH. PROCEDURE: OPERATOR: Informed consent was obtained after the risks and benefits of the procedure were discussed with the patient and all questions were answered fully. Universal protocol was observed and a standard timeout was performed. The patient was positioned prone and the back was prepped and draped in the normal sterile fashion. 1% lidocaine  was used for local anesthesia. The subarachnoid space was accessed with a 22-gauge 6-inch Gertie Marx spinal needle via a right interlaminar approach at the L3-L4 level. The patient was then rolled into the left lateral decubitus position, and an opening pressure of 30 cm water was measured. 21 ml of CSF were removed and discarded. A closing pressure of 20 cm water was measured. The stylet was reinserted, spinal needle was removed and brief pressure was applied at the puncture site. There were no immediate complications and the patient tolerated the procedure well. IMPRESSION: 1. Successful fluoroscopically-guided lumbar puncture with elevated opening pressure of 30 cm water. Electronically signed by: Dasie Hamburg MD 08/26/2024 12:08 PM EDT RP Workstation: [id number]  CT ABDOMEN PELVIS W CONTRAST Result Date: 10/31/2024 EXAM: CT ABDOMEN AND PELVIS WITH CONTRAST 10/31/2024 08:05:39 PM TECHNIQUE: CT of the abdomen and pelvis was performed with the administration of 100 mL of iohexol  (OMNIPAQUE ) 300 MG/ML solution. Multiplanar reformatted images are provided for review. Automated exposure control, iterative reconstruction, and/or weight-based adjustment of the mA/kV was utilized to reduce the radiation dose to as low as reasonably achievable. COMPARISON: Abdominal radiographs 10/28/2024 and CT abdomen and pelvis  06/20/2023. CLINICAL HISTORY: Abdominal pain, acute, nonlocalized. Vomiting for 2.5 weeks. Bloody discharge per rectum. Abnormal bowel movements. FINDINGS: LOWER CHEST: Lung bases are clear. LIVER: The liver is unremarkable. GALLBLADDER AND BILE DUCTS: Surgical absence of the gallbladder. No biliary ductal dilatation. SPLEEN: The spleen is unremarkable. PANCREAS: The pancreas is unremarkable. ADRENAL GLANDS: The adrenal glands are unremarkable. KIDNEYS, URETERS AND BLADDER: The kidneys are unremarkable. No stones in the kidneys or ureters. No hydronephrosis. No perinephric or periureteral stranding. The bladder is decompressed. GI AND BOWEL: Stomach and small bowel are decompressed. Liquid stool demonstrated throughout the colon without wall thickening or inflammatory stranding. No colonic distention. The appendix is normal. PERITONEUM AND RETROPERITONEUM: No ascites. No free air. VASCULATURE: The abdominal aorta is unremarkable. LYMPH NODES: Retroperitoneal lymph nodes are unremarkable. REPRODUCTIVE ORGANS: No acute abnormality. BONES AND SOFT TISSUES: No acute osseous abnormality. No focal soft tissue abnormality. IMPRESSION: 1. Fluid throughout the colon consistent with liquid stool suggesting possible infectious colitis. No wall thickening or obstruction. Electronically signed by: Elsie Gravely MD 10/31/2024 08:15 PM EST RP Workstation: [id number]        ASSESSMENT & PLAN   Assessment & Plan C. difficile colitis Diarrhea has improved with vancomycin , but abdominal pain persists and bowel movements remain mushy. Vancomycin  is effective, though it causes nausea and abdominal pain. Fidaxomicin  was considered as an alternative, but its cost  is prohibitive. They will continue vancomycin  as the infection is improving. Insurance coverage for fidaxomicin  will be checked. Hydration and nutrition with [employer] are encouraged. Obesity (BMI 30-39.9) OSA (obstructive sleep apnea) Zepbound  was denied, try  to get Wegovy  coverage morbid obesity BMI 35+ with obstructive sleep apnea and pseudotumor cerebri Abnormal urine Proteinuria, unspecified type Shared decision-making done; patient understood rationale and agreed to [facility]  Electrolyte abnormality Electrolyte depletion (hypokalemia, hypomagnesemia)   Potassium was low at the ER visit, likely due to vomiting and inadequate food intake. Magnesium  levels may also be low. Maintaining electrolyte balance is crucial. Potassium and magnesium  levels have been ordered. Hydration with Gatorade is encouraged, and additional potassium supplements are prescribed. Intractable nausea Adverse effect of vancomycin , initial encounter Vancomycin -induced nausea and abdominal pain   Nausea and abdominal pain are likely side effects of vancomycin , worsening with eating. Zofran  and Phenergan  are used for nausea management. Fidaxomicin  may be considered if vancomycin  side effects worsen. They will continue Zofran  and Phenergan  for nausea. Muscular atrophy, unspecified site Rapid weight loss with muscle wasting   Rapid weight loss has led to low intracranial pressures and muscle wasting due to inadequate nutrition and illness. Maintaining muscle mass is important, and physical therapy may be needed post-recovery. Nutritional intake with [employer] is encouraged. Pseudotumor cerebri Idiopathic intracranial hypertension   Intracranial pressures have decreased due to rapid weight loss, with no current symptoms of syncope. The neurosurgeon has adjusted vanesamide due to low pressures. Weight management is important.   ORDER ASSOCIATIONS  #   DIAGNOSIS / CONDITION ICD-10 ENCOUNTER ORDER     ICD-10-CM   1. C. difficile colitis  A04.72 fidaxomicin  (DIFICID ) 200 MG TABS tablet    Comp Met (CMET)    potassium chloride  SA (KLOR-CON  M) 20 MEQ tablet    Magnesium     2. Intractable nausea  R11.0 fidaxomicin  (DIFICID ) 200 MG TABS tablet    Comp Met (CMET)    3. Obesity  (BMI 30-39.9)  E66.9 semaglutide -weight management (WEGOVY ) 0.25 MG/0.5ML SOAJ SQ injection    4. OSA (obstructive sleep apnea)  G47.33 semaglutide -weight management (WEGOVY ) 0.25 MG/0.5ML SOAJ SQ injection    5. Abnormal urine  R82.90 Protein / creatinine ratio, urine    Urinalysis w microscopic + reflex cultur    CANCELED: Urinalysis w microscopic + reflex cultur    CANCELED: Protein / creatinine ratio, urine    6. Proteinuria, unspecified type  R80.9     7. Electrolyte abnormality  E87.8     8. Adverse effect of vancomycin , initial encounter  T36.8X5A fidaxomicin  (DIFICID ) 200 MG TABS tablet    9. Muscular atrophy, unspecified site  M62.50     10. Pseudotumor cerebri  G93.2            Orders Placed in Encounter:   Lab Orders         Comp Met (CMET)         Magnesium          Protein / creatinine ratio, urine         Urinalysis w microscopic + reflex cultur     Meds ordered this encounter  Medications   fidaxomicin  (DIFICID ) 200 MG TABS tablet    Sig: Take 1 tablet (200 mg total) by mouth 2 (two) times daily for 10 days.    Dispense:  20 tablet    Refill:  0   potassium chloride  SA (KLOR-CON  M) 20 MEQ tablet    Sig: Take 1 tablet (20 mEq  total) by mouth daily.    Dispense:  30 tablet    Refill:  3   semaglutide -weight management (WEGOVY ) 0.25 MG/0.5ML SOAJ SQ injection    Sig: Inject 0.25 mg into the skin once a week.    Dispense:  2 mL    Refill:  2         Treatment plan discussed and reviewed in detail. Explained medication safety and potential side effects.  Answered all patient questions and confirmed understanding and comfort with the plan. Encouraged patient to contact our office if they have any questions or concerns.  Agreed on patient coming for a sooner office visit if symptoms worsen, persist, or new symptoms develop. Discussed precautions in case of needing to visit the Emergency Department.     ----------------------------------------------------- Attestation:  Today's Healthcare Provider Bernardino KANDICE Cone, MD was located at office at [facility] [address] [place] [address].  The patient was located at home. All video encounter participant identities and locations confirmed visually and verbally.Today's Telemedicine visit was conducted via synchronous Video after consent for telemedicine was obtained:  Video connection was never lost   This document was transcribed and resynthesized, in part, by artificial intelligence ([employer])  using HIPAA-compliant recording of the clinical interaction;   We have discussed the our use of AI scribe software for clinical note transcription with the patient, who has given verbal consent to proceed.

## 2024-11-07 NOTE — Assessment & Plan Note (Signed)
 Zepbound  was denied, try to get Wegovy  coverage morbid obesity BMI 35+ with obstructive sleep apnea and pseudotumor cerebri

## 2024-11-07 NOTE — Assessment & Plan Note (Signed)
 Idiopathic intracranial hypertension   Intracranial pressures have decreased due to rapid weight loss, with no current symptoms of syncope. The neurosurgeon has adjusted vanesamide due to low pressures. Weight management is important.

## 2024-11-07 NOTE — Patient Instructions (Signed)
 VISIT SUMMARY:  You came in today because of ongoing abdominal pain and nausea related to your C. diff colitis. While your diarrhea has improved with vancomycin , you are still experiencing significant discomfort and difficulty eating. We discussed your current medications and nutritional challenges, and we have made some adjustments to help manage your symptoms and improve your overall health.  YOUR PLAN:  -CLOSTRIDIOIDES DIFFICILE COLITIS: C. diff colitis is an infection of the colon caused by the bacteria Clostridioides difficile. Your diarrhea has improved with vancomycin , but you still have abdominal pain and mushy bowel movements. We will continue with vancomycin  as it is helping with the infection, and we will check with your insurance about fidaxomicin  as an alternative. Please continue to stay hydrated and consider nutritional supplements like [employer] to support your nutrition.  -ELECTROLYTE DEPLETION (HYPOKALEMIA, HYPOMAGNESEMIA): Electrolyte depletion means that your body has low levels of important minerals like potassium and magnesium , which can happen due to vomiting and not eating enough. We have ordered tests to check your potassium and magnesium  levels. In the meantime, please continue taking your potassium supplements and drink Gatorade to help maintain your electrolyte balance.  -VANCOMYCIN -INDUCED NAUSEA AND ABDOMINAL PAIN: Vancomycin , the antibiotic you are taking, can cause nausea and abdominal pain, especially when you eat. You are already taking Zofran  and Phenergan  to help with the nausea. If these side effects get worse, we may consider switching to fidaxomicin . For now, continue with Zofran  and Phenergan  as prescribed.  -RAPID WEIGHT LOSS WITH MUSCLE WASTING: Rapid weight loss can lead to muscle loss and other health issues. It is important to maintain your muscle mass and overall nutrition. We encourage you to continue with nutritional supplements like [employer] and  consider physical therapy after you recover to help rebuild muscle.  -IDIOPATHIC INTRACRANIAL HYPERTENSION: Idiopathic intracranial hypertension is a condition where there is increased pressure around the brain. Your intracranial pressures have decreased due to your recent weight loss, and you are not experiencing symptoms like passing out. Your neurosurgeon has adjusted your medication accordingly. Managing your weight is important to keep this condition under control.  INSTRUCTIONS:  Please follow up with the weight loss and nutrition specialist as scheduled. Continue taking your medications and supplements as prescribed. Stay hydrated and try to maintain your nutrition with small, frequent meals and nutritional supplements. We will check your potassium and magnesium  levels and follow up with you regarding the insurance coverage for fidaxomicin .  It was a pleasure seeing you today! Your health and satisfaction are our top priorities.  Bernardino Cone, MD     Your Providers PCP: Cone Bernardino MATSU, MD,  [phone]) Referring Provider: Cone Bernardino MATSU, MD,  [phone]) Care Team Provider: Gorge Ade, MD,  [phone]) Care Team Provider: Domenica Reusing, MD,  [phone])  NEXT STEPS: [x]  Early Intervention: Schedule sooner appointment, call our on-call services, or go to emergency room if there is any significant Increase in pain or discomfort New or worsening symptoms Sudden or severe changes in your health [x]  Flexible Follow-Up: We recommend a No follow-ups on file. for optimal routine care. This allows for progress monitoring and treatment adjustments. [x]  Preventive Care: Schedule your annual preventive care visit! It's typically covered by insurance and helps identify potential health issues early. [x]  Lab & X-ray Appointments: Incomplete tests scheduled today, or call to schedule. X-rays: [facility] (M-F, 8:30am-noon or 1pm-5pm). [x]  Medical  Information Release: Sign a release form at front desk to obtain relevant medical information we don't  have.  MAKING THE MOST OF OUR FOCUSED 20 MINUTE APPOINTMENTS: [x]   Clearly state your top concerns at the beginning of the visit to focus our discussion [x]   If you anticipate you will need more time, please inform the front desk during scheduling - we can book multiple appointments in the same week. [x]   If you have transportation problems- use our convenient video appointments or ask about transportation support. [x]   We can get down to business faster if you use MyChart to update information before the visit and submit non-urgent questions before your visit. Thank you for taking the time to provide details through MyChart.  Let our nurse know and she can import this information into your encounter documents.  Arrival and Wait Times: [x]   Arriving on time ensures that everyone receives prompt attention. [x]   Early morning (8a) and afternoon (1p) appointments tend to have shortest wait times. [x]   Unfortunately, we cannot delay appointments for late arrivals or hold slots during phone calls.  Getting Answers and Following Up [x]   Simple Questions & Concerns: For quick questions or basic follow-up after your visit, reach us  at [phone] or MyChart messaging. [x]   Complex Concerns: If your concern is more complex, scheduling an appointment might be best. Discuss this with the staff to find the most suitable option. [x]   Lab & Imaging Results: We'll contact you directly if results are abnormal or you don't use MyChart. Most normal results will be on MyChart within 2-3 business days, with a review message from Dr. Jesus. Haven't heard back in 2 weeks? Need results sooner? Contact us  at [phone]. [x]   Referrals: Our referral coordinator will manage specialist referrals. The specialist's office should contact you within 2 weeks to schedule an appointment. Call us  if you haven't heard from  them after 2 weeks.  Staying Connected [x]   MyChart: Activate your MyChart for the fastest way to access results and message us . See the last page of this paperwork for instructions on how to activate.  Bring to Your Next Appointment [x]   Medications: Please bring all your medication bottles to your next appointment to ensure we have an accurate record of your prescriptions. [x]   Health Diaries: If you're monitoring any health conditions at home, keeping a diary of your readings can be very helpful for discussions at your next appointment.  Billing [x]   X-ray & Lab Orders: These are billed by separate companies. Contact the invoicing company directly for questions or concerns. [x]   Visit Charges: Discuss any billing inquiries with our administrative services team.  Your Satisfaction Matters [x]   Share Your Experience: We strive for your satisfaction! If you have any complaints, or preferably compliments, please let Dr. Jesus know directly or contact our Practice Administrators, Manuelita Rubin or [employer], by asking at the front desk.   Reviewing Your Records [x]   Review this early draft of your clinical encounter notes below and the final encounter summary tomorrow on MyChart after its been completed.  All orders placed so far are visible here: C. difficile colitis -     Fidaxomicin ; Take 1 tablet (200 mg total) by mouth 2 (two) times daily for 10 days.  Dispense: 20 tablet; Refill: 0 -     Comprehensive metabolic panel with GFR; Future -     Potassium Chloride  [facility]; Take 1 tablet (20 mEq total) by mouth daily.  Dispense: 30 tablet; Refill: 3 -     Magnesium ; Future  Intractable nausea -  Fidaxomicin ; Take 1 tablet (200 mg total) by mouth 2 (two) times daily for 10 days.  Dispense: 20 tablet; Refill: 0 -     Comprehensive metabolic panel with GFR; Future  Obesity (BMI 30-39.9) -     Wegovy ; Inject 0.25 mg into the skin once a week.  Dispense: 2 mL; Refill: 2  OSA  (obstructive sleep apnea) -     Wegovy ; Inject 0.25 mg into the skin once a week.  Dispense: 2 mL; Refill: 2  Abnormal urine -     Protein / creatinine ratio, urine; Future -     Urinalysis w microscopic + reflex cultur; Future  Proteinuria, unspecified type  Electrolyte abnormality  Adverse effect of vancomycin , initial encounter -     Fidaxomicin ; Take 1 tablet (200 mg total) by mouth 2 (two) times daily for 10 days.  Dispense: 20 tablet; Refill: 0  Muscular atrophy, unspecified site  Pseudotumor cerebri

## 2024-11-08 ENCOUNTER — Other Ambulatory Visit

## 2024-11-08 ENCOUNTER — Other Ambulatory Visit (INDEPENDENT_AMBULATORY_CARE_PROVIDER_SITE_OTHER)

## 2024-11-08 ENCOUNTER — Telehealth: Payer: Self-pay

## 2024-11-08 DIAGNOSIS — R110 Nausea: Secondary | ICD-10-CM

## 2024-11-08 DIAGNOSIS — R8290 Unspecified abnormal findings in urine: Secondary | ICD-10-CM

## 2024-11-08 DIAGNOSIS — A0472 Enterocolitis due to Clostridium difficile, not specified as recurrent: Secondary | ICD-10-CM

## 2024-11-08 NOTE — Telephone Encounter (Signed)
 Pharmacy Patient Advocate Encounter   Received notification from Onbase that prior authorization for Wegovy  0.25MG /0.5ML auto-injectors is required/requested.   Insurance verification completed.   The patient is insured through [facility].   Per test claim: PA required; PA submitted to above mentioned insurance via Latent Key/confirmation #/EOC [id number] Status is pending

## 2024-11-09 LAB — COMPREHENSIVE METABOLIC PANEL WITH GFR
ALT: 19 U/L (ref 3–35)
AST: 21 U/L (ref 5–37)
Albumin: 4.1 g/dL (ref 3.5–5.2)
Alkaline Phosphatase: 109 U/L (ref 39–117)
BUN: 14 mg/dL (ref 6–23)
CO2: 23 meq/L (ref 19–32)
Calcium: 9.3 mg/dL (ref 8.4–10.5)
Chloride: 103 meq/L (ref 96–112)
Creatinine, Ser: 0.78 mg/dL (ref 0.40–1.20)
GFR: 93.82 mL/min
Glucose, Bld: 107 mg/dL — ABNORMAL HIGH (ref 70–99)
Potassium: 3.2 meq/L — ABNORMAL LOW (ref 3.5–5.1)
Sodium: 137 meq/L (ref 135–145)
Total Bilirubin: 0.4 mg/dL (ref 0.2–1.2)
Total Protein: 7.1 g/dL (ref 6.0–8.3)

## 2024-11-09 LAB — URINALYSIS W MICROSCOPIC + REFLEX CULTURE
Bilirubin Urine: NEGATIVE
Glucose, UA: NEGATIVE
Hgb urine dipstick: NEGATIVE
Leukocyte Esterase: NEGATIVE
Nitrites, Initial: NEGATIVE
RBC / HPF: NONE SEEN /HPF (ref 0–2)
Specific Gravity, Urine: 1.031 (ref 1.001–1.035)
Yeast: NONE SEEN /HPF
pH: 5.5 (ref 5.0–8.0)

## 2024-11-09 LAB — PROTEIN / CREATININE RATIO, URINE
Creatinine, Urine: 251 mg/dL (ref 20–275)
Protein/Creat Ratio: 100 mg/g{creat} (ref 24–184)
Protein/Creatinine Ratio: 0.1 mg/mg{creat} (ref 0.024–0.184)
Total Protein, Urine: 25 mg/dL — ABNORMAL HIGH (ref 5–24)

## 2024-11-09 LAB — MAGNESIUM: Magnesium: 1.6 mg/dL (ref 1.5–2.5)

## 2024-11-09 LAB — NO CULTURE INDICATED

## 2024-11-09 NOTE — Telephone Encounter (Signed)
 Pharmacy Patient Advocate Encounter  Received notification from [facility] that Prior Authorization for Wegovy  0.25MG /0.5ML auto-injectors  has been DENIED.  Full denial letter will be uploaded to the media tab. See denial reason below.   PA #/Case ID/Reference #: [id number]

## 2024-11-09 NOTE — Telephone Encounter (Signed)
 Please advise on another treatment plan or advise on how to proceed with insurance denying medications.

## 2024-11-10 ENCOUNTER — Ambulatory Visit: Payer: Self-pay | Admitting: Internal Medicine

## 2024-11-11 ENCOUNTER — Inpatient Hospital Stay: Admitting: Internal Medicine

## 2024-11-11 ENCOUNTER — Ambulatory Visit: Admitting: Internal Medicine

## 2024-11-11 NOTE — Telephone Encounter (Signed)
 read by Rosina GORMAN Abate at 3:46PM on 11/10/2024

## 2024-11-14 ENCOUNTER — Encounter: Payer: Self-pay | Admitting: Family Medicine

## 2024-11-14 ENCOUNTER — Ambulatory Visit: Admitting: Family Medicine

## 2024-11-14 ENCOUNTER — Other Ambulatory Visit (HOSPITAL_BASED_OUTPATIENT_CLINIC_OR_DEPARTMENT_OTHER): Payer: Self-pay

## 2024-11-14 VITALS — BP 134/88 | HR 103 | Temp 98.0°F | Ht 65.5 in | Wt 223.0 lb

## 2024-11-14 DIAGNOSIS — R7303 Prediabetes: Secondary | ICD-10-CM

## 2024-11-14 DIAGNOSIS — E785 Hyperlipidemia, unspecified: Secondary | ICD-10-CM | POA: Diagnosis not present

## 2024-11-14 DIAGNOSIS — Z6836 Body mass index (BMI) 36.0-36.9, adult: Secondary | ICD-10-CM

## 2024-11-14 DIAGNOSIS — G932 Benign intracranial hypertension: Secondary | ICD-10-CM | POA: Diagnosis not present

## 2024-11-14 MED ORDER — ALPRAZOLAM 0.25 MG PO TABS
0.2500 mg | ORAL_TABLET | Freq: Every day | ORAL | 0 refills | Status: AC | PRN
  Filled 2024-11-14: qty 20, 20d supply, fill #0

## 2024-11-14 MED ORDER — LISDEXAMFETAMINE DIMESYLATE 30 MG PO CAPS
30.0000 mg | ORAL_CAPSULE | Freq: Every day | ORAL | 0 refills | Status: AC
  Filled 2024-11-14: qty 30, 30d supply, fill #0

## 2024-11-14 MED ORDER — GABAPENTIN 300 MG PO CAPS
600.0000 mg | ORAL_CAPSULE | Freq: Three times a day (TID) | ORAL | 1 refills | Status: DC
  Filled 2024-11-14: qty 180, 30d supply, fill #0

## 2024-11-14 MED ORDER — NUCYNTA 100 MG PO TABS
100.0000 mg | ORAL_TABLET | Freq: Three times a day (TID) | ORAL | 0 refills | Status: AC | PRN
  Filled 2024-11-14: qty 90, 30d supply, fill #0

## 2024-11-14 NOTE — Progress Notes (Signed)
 " Office: [phone]  /  Fax: [phone]   Initial Visit  Diane [name] was seen in clinic today to evaluate for obesity. She is interested in losing weight to improve overall health and reduce the risk of weight related complications. She presents today to review program treatment options, initial physical assessment, and evaluation.     She was referred by: Specialist  When asked what else they would like to accomplish? She states: Adopt a healthier eating pattern and lifestyle, Improve energy levels and physical activity, Improve existing medical conditions, Reduce number of medications, Improve quality of life, and Improve appearance  Weight history: max weight 255 lb ( 5 weeks ago) with a lowest weight of 175 lb. Was normal weight before having kids.   Had a TAH at age 26.  Has never used AOMs Recently had C. Diff and has lost weight  When asked how has your weight affected you? She states: Contributed to medical problems and Having fatigue  Some associated conditions: Hyperlipidemia, OSA, Prediabetes, and Other: IIH  Contributing factors: family history of obesity, use of obesogenic medications: None, moderate to high levels of stress, chronic skipping of meals, mental health problems, and menopause  Weight promoting medications identified: None  Current nutrition plan: None  Current level of physical activity: NEAT  Current or previous pharmacotherapy: None  Response to medication: was prescribed Wegovy  (not taking due to cost) and is fearful of SE due to mom's side effects   Past medical history includes:   Past Medical History:  Diagnosis Date   Acute injury of left knee cartilage 05/24/2024   ADD (attention deficit disorder with hyperactivity)    Allergy     Anxiety    Arthritis    Atypical chest pain 07/26/2021   Atypical chest pain     Benign intracranial hypertension 06/16/2008   Qualifier: Diagnosis of   By: Zimonjic, Milica         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 of being hard to wake up post-op   Gastric ulcer 03/16/2015   GERD (gastroesophageal reflux disease)    Headache 07/09/2007   Qualifier: Diagnosis of   By: Tammie MD, Bruce      Replacing diagnoses that were inactivated after the 02/16/23 regulatory import     History of gastric ulcer    History of kidney stones    Hyperlipidemia    IBS (irritable bowel syndrome)    Lower extremity edema 07/26/2021   Lower extremity edema     Migraines    neurologist-  dr c. hagen ([facility] in [place])-- treated with Emgality  injection every 30 days   Mild obstructive sleep apnea    per study in epic 03-21-2017 mild osa, recommendation cpap, mouth appliance, wt loss   Nausea and vomiting 12/03/2016   OAB (overactive bladder) 08/12/2017   Osteoarthritis    right ankle   Osteochondral lesion 09/2016   right ankle   Osteomyelitis of knee region [facility])    Peripheral vascular disease    NERVE DAMAGE RIGHT LEG AND FOOT DUE TO INJURGY.    Plantar fasciitis of left foot    Pleural effusion 12/15/2013   PONV (postoperative  nausea and vomiting)    AND HEADACHES   Pseudotumor cerebri    at back head   RUQ pain    S/P dilatation of esophageal stricture 07/2018   Sinusitis 08/12/2018   RESOLVED WITH ANTIOBIOTIC   SUI (stress urinary incontinence, female)    Thunderclap headache 08/17/2024   Wears contact lenses    Wears partial dentures    upper     Objective:   BP 134/88   Pulse (!) 103   Temp 98 F (36.7 C)   Ht 5' 5.5 (1.664 m)   Wt 223 lb (101.2 kg)   LMP 08/26/2018   SpO2 96%   BMI 36.54 kg/m  She was weighed on the bioimpedance scale: Body mass index is 36.54 kg/m.  Peak  Weight:255 , Body Fat%:49.2, Visceral Fat Rating:12, Weight trend over the last 12 months: Decreasing  General:  Alert, oriented and cooperative. Patient is in no acute distress.  Respiratory: Normal respiratory effort, no problems with respiration noted   Gait: able to ambulate independently  Mental Status: Normal mood and affect. Normal behavior. Normal judgment and thought content.   DIAGNOSTIC DATA REVIEWED:  BMET    Component Value Date/Time   NA 137 11/08/2024 1540   NA 139 07/26/2021 1053   K 3.2 (L) 11/08/2024 1540   CL 103 11/08/2024 1540   CO2 23 11/08/2024 1540   GLUCOSE 107 (H) 11/08/2024 1540   BUN 14 11/08/2024 1540   BUN 9 07/26/2021 1053   CREATININE 0.78 11/08/2024 1540   CALCIUM  9.3 11/08/2024 1540   GFRNONAA >60 10/31/2024 1540   GFRAA >60 07/13/2020 2143   Lab Results  Component Value Date   HGBA1C 5.8 (A) 07/13/2023   HGBA1C 5.6 11/12/2016   No results found for: INSULIN CBC    Component Value Date/Time   WBC 7.9 10/31/2024 1540   RBC 4.87 10/31/2024 1540   HGB 13.3 10/31/2024 1540   HCT 41.6 10/31/2024 1540   PLT 280 10/31/2024 1540   MCV 85.4 10/31/2024 1540   MCH 27.3 10/31/2024 1540   MCHC 32.0 10/31/2024 1540   RDW 13.6 10/31/2024 1540   Iron /TIBC/Ferritin/ %Sat    Component Value Date/Time   IRON  14 (L) 02/25/2017 1614   IRONPCTSAT 4.1 (L) 02/25/2017 1614   Lipid Panel     Component Value Date/Time   CHOL 157 06/17/2023 1159   CHOL 246 (H) 07/26/2021 1053   TRIG 184.0 (H) 06/17/2023 1159   HDL 37.20 (L) 06/17/2023 1159   HDL 35 (L) 07/26/2021 1053   CHOLHDL 4 06/17/2023 1159   VLDL 36.8 06/17/2023 1159   LDLCALC 83 06/17/2023 1159   LDLCALC 180 (H) 07/26/2021 1053   Hepatic Function Panel     Component Value Date/Time   PROT 7.1 11/08/2024 1540   PROT 6.8 07/26/2021 1053   ALBUMIN 4.1 11/08/2024 1540   ALBUMIN 4.1 02/02/2024 1146   AST 21 11/08/2024 1540   ALT 19 11/08/2024 1540   ALKPHOS 109 11/08/2024 1540    BILITOT 0.4 11/08/2024 1540   BILITOT 0.2 07/26/2021 1053   BILIDIR <0.10 07/26/2021 1053      Component Value Date/Time   TSH 1.090 02/29/2024 2200   TSH 1.62 06/17/2023 1159     Assessment and Plan:   IIH (idiopathic intracranial hypertension) Continue Zonegran  25 mg tid per Dr Rosslyn Begin active plan for BMI reduction  Morbid obesity (HCC)  BMI 36.0-36.9,adult  Dyslipidemia (high LDL; low HDL) On atorvastatin  40 mg daily Look for  improvements with weight loss  Prediabetes Lab Results  Component Value Date   HGBA1C 5.8 (A) 07/13/2023   Look for improvements with dietary changes       Obesity Treatment / Action Plan:  Patient will work on garnering support from family and friends to begin weight loss journey. Will work on eliminating or reducing the presence of highly palatable, calorie dense foods in the home. Will complete provided nutritional and psychosocial assessment questionnaire before the next appointment. Will be scheduled for indirect calorimetry to determine resting energy expenditure in a fasting state.  This will allow us  to create a reduced calorie, high-protein meal plan to promote loss of fat mass while preserving muscle mass. Will think about ideas on how to incorporate physical activity into their daily routine. Was counseled on nutritional approaches to weight loss and benefits of reducing processed foods and consuming plant-based foods and high quality protein as part of nutritional weight management. Was counseled on pharmacotherapy and role as an adjunct in weight management.   Obesity Education Performed Today:  She was weighed on the bioimpedance scale and results were discussed and documented in the synopsis.  We discussed obesity as a disease and the importance of a more detailed evaluation of all the factors contributing to the disease.  We discussed the importance of long term lifestyle changes which include nutrition, exercise and  behavioral modifications as well as the importance of customizing this to her specific health and social needs.  We discussed the benefits of reaching a healthier weight to alleviate the symptoms of existing conditions and reduce the risks of the biomechanical, metabolic and psychological effects of obesity.  BRIANNI MANTHE appears to be in the action stage of change and states they are ready to start intensive lifestyle modifications and behavioral modifications.  21 minutes was spent today on this visit including the above counseling, pre-visit chart review, and post-visit documentation.  Reviewed by clinician on day of visit: allergies, medications, problem list, medical history, surgical history, family history, social history, and previous encounter notes pertinent to obesity diagnosis.    Darice Haddock, D.O. DABFM, [facility] [address] [place], [place] [address] [phone]   "

## 2024-11-21 ENCOUNTER — Ambulatory Visit: Admitting: Internal Medicine

## 2024-11-23 ENCOUNTER — Ambulatory Visit
Admission: RE | Admit: 2024-11-23 | Discharge: 2024-11-23 | Disposition: A | Discharge status: Home / Self Care | Source: Ambulatory Visit | Attending: Internal Medicine | Admitting: Internal Medicine

## 2024-11-23 ENCOUNTER — Telehealth: Payer: Self-pay | Admitting: Internal Medicine

## 2024-11-23 DIAGNOSIS — R58 Hemorrhage, not elsewhere classified: Secondary | ICD-10-CM

## 2024-11-23 NOTE — Telephone Encounter (Signed)
 PT referred/ asking to be seen today 11/24/23.

## 2024-11-23 NOTE — Telephone Encounter (Signed)
 Patient has been seen previously by our office in 2019 then has had 3 other GI providers since, with [facility] being the last point of contact 11/02/24. Per current [employer] guidelines, patient will need to request a transfer of care to one of our physicians and will need to have all records available for review with an acceptance from our physician before she can be placed on the schedule. Please advise patient of this.

## 2024-11-25 ENCOUNTER — Encounter: Payer: Self-pay | Admitting: Internal Medicine

## 2024-11-25 ENCOUNTER — Telehealth: Payer: Self-pay

## 2024-11-25 ENCOUNTER — Ambulatory Visit: Admitting: Internal Medicine

## 2024-11-25 VITALS — BP 110/72 | HR 74 | Temp 98.0°F | Ht 65.5 in | Wt 225.4 lb

## 2024-11-25 DIAGNOSIS — N63 Unspecified lump in unspecified breast: Secondary | ICD-10-CM

## 2024-11-25 DIAGNOSIS — R8290 Unspecified abnormal findings in urine: Secondary | ICD-10-CM

## 2024-11-25 DIAGNOSIS — K529 Noninfective gastroenteritis and colitis, unspecified: Secondary | ICD-10-CM

## 2024-11-25 DIAGNOSIS — E860 Dehydration: Secondary | ICD-10-CM | POA: Diagnosis not present

## 2024-11-25 DIAGNOSIS — R1084 Generalized abdominal pain: Secondary | ICD-10-CM | POA: Diagnosis not present

## 2024-11-25 DIAGNOSIS — A0472 Enterocolitis due to Clostridium difficile, not specified as recurrent: Secondary | ICD-10-CM

## 2024-11-25 DIAGNOSIS — G932 Benign intracranial hypertension: Secondary | ICD-10-CM

## 2024-11-25 DIAGNOSIS — E669 Obesity, unspecified: Secondary | ICD-10-CM

## 2024-11-25 LAB — CBC WITH DIFFERENTIAL/PLATELET
Basophils Absolute: 0 K/uL (ref 0.0–0.1)
Basophils Relative: 0.9 % (ref 0.0–3.0)
Eosinophils Absolute: 0.1 K/uL (ref 0.0–0.7)
Eosinophils Relative: 2.7 % (ref 0.0–5.0)
HCT: 39.8 % (ref 36.0–46.0)
Hemoglobin: 13 g/dL (ref 12.0–15.0)
Lymphocytes Relative: 30.1 % (ref 12.0–46.0)
Lymphs Abs: 1.6 K/uL (ref 0.7–4.0)
MCHC: 32.7 g/dL (ref 30.0–36.0)
MCV: 84 fl (ref 78.0–100.0)
Monocytes Absolute: 0.3 K/uL (ref 0.1–1.0)
Monocytes Relative: 6.1 % (ref 3.0–12.0)
Neutro Abs: 3.1 K/uL (ref 1.4–7.7)
Neutrophils Relative %: 60.2 % (ref 43.0–77.0)
Platelets: 240 K/uL (ref 150.0–400.0)
RBC: 4.73 Mil/uL (ref 3.87–5.11)
RDW: 14.5 % (ref 11.5–15.5)
WBC: 5.2 K/uL (ref 4.0–10.5)

## 2024-11-25 LAB — COMPREHENSIVE METABOLIC PANEL WITH GFR
ALT: 16 U/L (ref 3–35)
AST: 19 U/L (ref 5–37)
Albumin: 4.4 g/dL (ref 3.5–5.2)
Alkaline Phosphatase: 121 U/L — ABNORMAL HIGH (ref 39–117)
BUN: 11 mg/dL (ref 6–23)
CO2: 25 meq/L (ref 19–32)
Calcium: 9.3 mg/dL (ref 8.4–10.5)
Chloride: 105 meq/L (ref 96–112)
Creatinine, Ser: 0.79 mg/dL (ref 0.40–1.20)
GFR: 92.37 mL/min
Glucose, Bld: 93 mg/dL (ref 70–99)
Potassium: 4 meq/L (ref 3.5–5.1)
Sodium: 137 meq/L (ref 135–145)
Total Bilirubin: 0.3 mg/dL (ref 0.2–1.2)
Total Protein: 7.7 g/dL (ref 6.0–8.3)

## 2024-11-25 MED ORDER — CHOLESTYRAMINE 4 G PO PACK: 4.0000 g | PACK | Freq: Three times a day (TID) | ORAL | 12 refills | Status: AC

## 2024-11-25 NOTE — Telephone Encounter (Signed)
 We received a fax from [facility] requesting  a new script to be placed for 100mg  BID of zonisamide .   I reviewed the last office visit note where Dr. Rosslyn states  I would like her to go down to 275 mg of zonisamide  a day and have given her a prescription for 25 mg capsules.SABRASABRAI will see her back in 2 months.  She was understanding that at 175 is still too much, that she is okay to go down to 150.   The medication script is being requested above the recommended dose at this visit.   I called the patient and left a voicemail to verify the dose she is currently taking. If changes to the script need to be made I will forward this on to Sam P NP to review and adjust orders as needed.

## 2024-11-25 NOTE — Progress Notes (Unsigned)
 ==============================  [facility]: [phone]   -- Medical Office Visit --  Patient: Diane [name]      Age: 44 y.o.       Sex:  female  Date:   11/25/2024 Today's Healthcare Provider: Bernardino KANDICE Cone, MD  ==============================   Chief Complaint: Diarrhea (Still having diarrhea stomach pains not any better. States hard to get in with gi dr )  Discussed the use of AI scribe software for clinical note transcription with the patient, who gave verbal consent to proceed. History of Present Illness 44 year old female who presents with persistent diarrhea and abdominal pain.  She has been experiencing persistent diarrhea and abdominal pain, described as cramping. The diarrhea is liquid and has been ongoing for an extended period. She has been eating only once a day, consuming about half of a meal each time. She completed a full course of vancomycin  and fidaxomicin , which provided some relief, but symptoms persist. She suspects she may have contracted C. diff due to exposure to contaminated materials at home.  She is part of a healthy weight and wellness program and has lost weight rapidly, which has caused her blood pressure to drop. She is concerned about dehydration and reports feeling exhausted, sleeping during the day, and experiencing daily headaches. She is taking probiotics and a hydration supplement to manage dehydration. No fever is present, but she reports daily headaches and fatigue. No significant swelling in her ankles.  She mentions a recent mammogram due to finding a lump in her right breast. She has a family history of breast cancer, as her grandmother had the disease. The mammogram results were negative, but she remains concerned due to the family history and the presence of the lump.  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), Choking, Cholelithiasis and acute cholecystitis without obstruction (12/02/2016), Chronic cough (12/27/2013), Depression, Dry mouth (08/12/2017), Dysmenorrhea, Family history of adverse reaction to anesthesia, Gastric ulcer (03/16/2015), GERD (gastroesophageal reflux disease), Headache (07/09/2007), History of gastric ulcer, History of kidney stones, Hyperlipidemia, IBS (irritable bowel syndrome), Lower extremity edema (07/26/2021), Migraines, Mild obstructive sleep apnea, Nausea and vomiting (12/03/2016), OAB (overactive bladder) (08/12/2017), Osteoarthritis, Osteochondral lesion (09/2016), 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 Past Surgical History:   has a past surgical history that includes Cesarean section (12/24/2009); Cesarean section (11/20/2011); laparoscopic appendectomy (N/A, 06/11/2014); Bladder surgery (child); Wisdom tooth extraction; Ovarian cyst surgery (Right, 03/05/2007); Cystoscopy with  retrograde pyelogram, ureteroscopy and stent placement (Left, 08/10/2006); Synovectomy (Right, 12/13/2014); Knee arthroscopy with lateral release (Right, 12/13/2014); ORIF ankle fracture (Right, 01/05/2016); Hardware Removal (Right, 07/01/2016); Ankle arthroscopy (Right, 10/22/2016); Cholecystectomy (N/A, 12/03/2016); Ankle arthroscopy (Right, 09-15-2017   @[facility] ); Tonsillectomy and adenoidectomy (child); Esophagogastroduodenoscopy (egd) with esophageal dilation (07/2018); Robotic assisted laparoscopic hysterectomy and salpingectomy (Bilateral, 08/27/2018); Chondroplasty (Right, 01/12/2024); Harvest bone graft (Right, 01/12/2024); Implantation, matrix-induced autologous chondrocytes (Left, 05/24/2024); and Knee arthroscopy (Left, 05/24/2024). Social History:   reports that she has quit smoking. Her smoking use included cigarettes. She has a 7.5 pack-year smoking history. She uses smokeless tobacco. She reports current alcohol use of about 2.0 standard drinks of alcohol per week. She reports that she does not use drugs. Family History:  family history includes Alcohol abuse in her father and maternal grandfather; Anesthesia problems in her father; Arthritis in her father, maternal grandfather, maternal grandmother, mother, and paternal grandmother; Asthma in her maternal grandmother and mother; Brain cancer in her father; Breast cancer in her maternal grandmother; COPD in her paternal grandmother; Cancer in her father, maternal grandfather, and maternal grandmother; Cancer - Prostate in her maternal grandfather; Depression in her father; Diabetes in her maternal grandmother; Drug abuse in her father; Early death in her  father; Heart attack in her maternal grandmother; Heart disease in her maternal grandmother; Hyperlipidemia in her maternal grandfather and maternal grandmother; Hypertension in her father, maternal grandfather, maternal grandmother, and paternal grandmother; Irritable bowel syndrome in her mother; Learning disabilities in her daughter, mother, and son; Lung cancer in her father; Melanoma in her maternal grandfather; Miscarriages / Stillbirths in her mother; Multiple myeloma in her mother; Ovarian cancer in her maternal grandmother; Prostate cancer in her maternal grandfather. Allergies:  is allergic to droperidol , metoclopramide , nickel, prochlorperazine, nsaids, tolmetin, oxybutynin , adhesive [tape], metoclopramide  hcl, prochlorperazine edisylate, and sulfa antibiotics.   Medication Reconciliation: Current Outpatient Medications on File Prior to Visit  Medication Sig   senna-docusate (SENOKOT-S) 8.6-50 MG tablet Take 1 tablet by mouth daily.   Tapentadol  HCl (NUCYNTA ) 100 MG TABS Take 1 tablet (100 mg total) by mouth every 8 (eight) hours as needed.   tiZANidine  (ZANAFLEX ) 4 MG tablet Take 4 mg by mouth every 6 (six) hours as needed for muscle spasms.    triamcinolone  cream (KENALOG ) 0.1 % APPLY TO AFFECTED AREA TWICE A DAY   venlafaxine  XR (EFFEXOR -XR) 150 MG 24 hr capsule TAKE 2 CAPS BY MOUTH DAILY   zolmitriptan  (ZOMIG ) 5 MG tablet Take 1 tablet (5 mg total) by mouth as needed for migraine.   zonisamide  (ZONEGRAN ) 100 MG capsule Take 200 mg by mouth at bedtime.   zonisamide  (ZONEGRAN ) 25 MG capsule Take 1 capsule (25 mg total) by mouth in the morning, at noon, and at bedtime.   albuterol  (PROVENTIL  HFA;VENTOLIN  HFA) 108 (90 Base) MCG/ACT inhaler Inhale 2 puffs into the lungs every 6 (six) hours as needed for wheezing or shortness of breath.    ALPRAZolam  (XANAX ) 0.25 MG tablet Take 1 tablet (0.25 mg total) by mouth daily as needed. Use as little as possible and not after 3pm   atorvastatin   (LIPITOR) 40 MG tablet TAKE 1 TABLET BY MOUTH EVERY DAY   Bacillus Coagulans-Inulin (PROBIOTIC) 1-250 BILLION-MG CAPS    cetirizine (ZYRTEC) 10 MG tablet Take by mouth.   diclofenac  Sodium (VOLTAREN ) 1 % GEL Apply 4 g topically 4 (four) times daily.   EMGALITY  120 MG/ML SOAJ Inject 120 mg into the skin every 30 (thirty) days.   Fezolinetant  (VEOZAH )  45 MG TABS Take 1 tablet (45 mg total) by mouth daily at 6 (six) AM.   fluconazole  (DIFLUCAN ) 150 MG tablet Take 1 tablet (150 mg total) by mouth every 3 (three) days for yeast infection.   fluticasone  (FLONASE ) 50 MCG/ACT nasal spray Place 2 sprays into both nostrils daily.   gabapentin  (NEURONTIN ) 300 MG capsule Take 2 capsules (600 mg total) by mouth 3 (three) times daily.   gabapentin  (NEURONTIN ) 300 MG capsule Take 2 capsules (600 mg total) by mouth 3 (three) times daily.   lisdexamfetamine  (VYVANSE ) 30 MG capsule Take 1 capsule (30 mg total) by mouth daily.   lubiprostone (AMITIZA) 24 MCG capsule TAKE ONE CAPSULE (24 MCG DOSE) BY MOUTH 2 (TWO) TIMES DAILY WITH MEALS.   magnesium  oxide (MAG-OX) 400 MG tablet Take by mouth.   meclizine  (ANTIVERT ) 25 MG tablet Take 1 tablet (25 mg total) by mouth 3 (three) times daily as needed for dizziness.   Multiple Vitamins-Minerals (EQL CENTURY WOMENS) TABS    NUCYNTA  100 MG TABS Take 1 tablet by mouth every 8 (eight) hours as needed.   omeprazole (PRILOSEC) 40 MG capsule TAKE 1 (ONE) CAPSUL CAPSULE BY MOUTH DAILY   ondansetron  (ZOFRAN -ODT) 8 MG disintegrating tablet Take 1 tablet (8 mg total) by mouth every 8 (eight) hours as needed.   potassium chloride  SA (KLOR-CON  M) 20 MEQ tablet Take 1 tablet (20 mEq total) by mouth daily.   progesterone  (PROMETRIUM ) 100 MG capsule Take 3 capsules (300 mg total) by mouth daily.   promethazine  (PHENERGAN ) 25 MG tablet Take 1 tablet (25 mg total) by mouth every 8 (eight) hours as needed for nausea or vomiting.   traZODone (DESYREL) 50 MG tablet Take 1-2 tablets by mouth  at bedtime as needed. (Patient not taking: Reported on 11/25/2024)   Current Facility-Administered Medications on File Prior to Visit  Medication   Erenumab -aooe SOAJ 140 mg  There are no discontinued medications.   Physical Exam:    11/25/2024    9:01 AM 11/14/2024    1:00 PM 11/01/2024    8:48 AM  Vitals with BMI  Height 5' 5.5 5' 5.5 5' 7  Weight 225 lbs 6 oz 223 lbs 233 lbs  BMI 36.92 36.53 36.48  Systolic 110 134 866  Diastolic 72 88 85  Pulse 74 103 87  Vital signs reviewed.  Nursing notes reviewed. Weight trend reviewed. Physical Activity: Not on file   General Appearance:  No acute distress appreciable.   Well-groomed, uncomfortable-appearing female.  Well proportioned with no abnormal fat distribution.  Good muscle tone. Pulmonary:  Normal work of breathing at rest, no respiratory distress apparent.    Musculoskeletal: All extremities are intact.  Neurological:  Awake, alert, oriented, and engaged.  No obvious focal neurological deficits or cognitive impairments.  Sensorium seems unclouded.   Speech is clear and coherent with logical content. Psychiatric:  Appropriate mood, pleasant and cooperative demeanor, thoughtful and engaged during the exam Truncal adiposity  Results Labs Urinalysis: Proteinuria, ketonuria, pyuria, rare bacteria  Radiology 3D mammogram, right breast (11/23/2024): Negative     10/28/2024    9:19 AM 06/17/2024    1:13 PM 03/08/2024   10:54 AM 06/17/2023   10:20 AM  PHQ 2/9 Scores  PHQ - 2 Score 2 1 0 2  PHQ- 9 Score 10 3  0  7      Data saved with a previous flowsheet row definition   Lab on 11/08/2024  Component Date Value Ref Range Status  Color, Urine 11/08/2024 DARK YELLOW  YELLOW Final   APPearance 11/08/2024 CLOUDY (A)  CLEAR Final   Specific Gravity, Urine 11/08/2024 1.031  1.001 - 1.035 Final   pH 11/08/2024 5.5  5.0 - 8.0 Final   Glucose, UA 11/08/2024 NEGATIVE  NEGATIVE Final   Bilirubin Urine 11/08/2024 NEGATIVE  NEGATIVE  Final   Ketones, ur 11/08/2024 TRACE (A)  NEGATIVE Final   Hgb urine dipstick 11/08/2024 NEGATIVE  NEGATIVE Final   Protein, ur 11/08/2024 TRACE (A)  NEGATIVE Final   Nitrites, Initial 11/08/2024 NEGATIVE  NEGATIVE Final   Leukocyte Esterase 11/08/2024 NEGATIVE  NEGATIVE Final   WBC, UA 11/08/2024 0-5  0 - 5 /HPF Final   RBC / HPF 11/08/2024 NONE SEEN  0 - 2 /HPF Final   Squamous Epithelial / HPF 11/08/2024 40-60 (A)  < OR = 5 /HPF Final   Bacteria, UA 11/08/2024 MODERATE (A)  NONE SEEN /HPF Final   Calcium  Oxalate Crystal 11/08/2024 MANY (A)  NONE OR FEW /HPF Final   Hyaline Cast 11/08/2024 40-60 (A)  NONE SEEN /LPF Final   Yeast 11/08/2024 NONE SEEN  NONE SEEN /HPF Final   Note 11/08/2024    Final   Creatinine, Urine 11/08/2024 251  20 - 275 mg/dL Final   Protein/Creat Ratio 11/08/2024 100  24 - 184 mg/g creat Final   Protein/Creatinine Ratio 11/08/2024 0.100  0.024 - 0.184 mg/mg creat Final   Total Protein, Urine 11/08/2024 25 (H)  5 - 24 mg/dL Final   Magnesium  11/08/2024 1.6  1.5 - 2.5 mg/dL Final   Sodium 87/76/7974 137  135 - 145 mEq/L Final   Potassium 11/08/2024 3.2 (L)  3.5 - 5.1 mEq/L Final   Chloride 11/08/2024 103  96 - 112 mEq/L Final   CO2 11/08/2024 23  19 - 32 mEq/L Final   Glucose, Bld 11/08/2024 107 (H)  70 - 99 mg/dL Final   BUN 87/76/7974 14  6 - 23 mg/dL Final   Creatinine, Ser 11/08/2024 0.78  0.40 - 1.20 mg/dL Final   Total Bilirubin 11/08/2024 0.4  0.2 - 1.2 mg/dL Final   Alkaline Phosphatase 11/08/2024 109  39 - 117 U/L Final   AST 11/08/2024 21  5 - 37 U/L Final   ALT 11/08/2024 19  3 - 35 U/L Final   Total Protein 11/08/2024 7.1  6.0 - 8.3 g/dL Final   Albumin 87/76/7974 4.1  3.5 - 5.2 g/dL Final   GFR 87/76/7974 93.82  >60.00 mL/min Final   Calcium  11/08/2024 9.3  8.4 - 10.5 mg/dL Final   Reflexve Urine Culture 11/08/2024    Final  Admission on 10/31/2024, Discharged on 11/01/2024  Component Date Value Ref Range Status   Lipase 10/31/2024 23  11 - 51  U/L Final   Sodium 10/31/2024 140  135 - 145 mmol/L Final   Potassium 10/31/2024 2.9 (L)  3.5 - 5.1 mmol/L Final   Chloride 10/31/2024 103  98 - 111 mmol/L Final   CO2 10/31/2024 23  22 - 32 mmol/L Final   Glucose, Bld 10/31/2024 94  70 - 99 mg/dL Final   BUN 87/84/7974 7  6 - 20 mg/dL Final   Creatinine, Ser 10/31/2024 0.89  0.44 - 1.00 mg/dL Final   Calcium  10/31/2024 9.5  8.9 - 10.3 mg/dL Final   Total Protein 87/84/7974 7.7  6.5 - 8.1 g/dL Final   Albumin 87/84/7974 4.3  3.5 - 5.0 g/dL Final   AST 87/84/7974 26  15 - 41 U/L Final  ALT 10/31/2024 19  0 - 44 U/L Final   Alkaline Phosphatase 10/31/2024 141 (H)  38 - 126 U/L Final   Total Bilirubin 10/31/2024 0.3  0.0 - 1.2 mg/dL Final   GFR, Estimated 10/31/2024 >60  >60 mL/min Final   Anion gap 10/31/2024 14  5 - 15 Final   WBC 10/31/2024 7.9  4.0 - 10.5 K/uL Final   RBC 10/31/2024 4.87  3.87 - 5.11 MIL/uL Final   Hemoglobin 10/31/2024 13.3  12.0 - 15.0 g/dL Final   HCT 87/84/7974 41.6  36.0 - 46.0 % Final   MCV 10/31/2024 85.4  80.0 - 100.0 fL Final   MCH 10/31/2024 27.3  26.0 - 34.0 pg Final   MCHC 10/31/2024 32.0  30.0 - 36.0 g/dL Final   RDW 87/84/7974 13.6  11.5 - 15.5 % Final   Platelets 10/31/2024 280  150 - 400 K/uL Final   nRBC 10/31/2024 0.0  0.0 - 0.2 % Final   Color, Urine 10/31/2024 YELLOW  YELLOW Final   APPearance 10/31/2024 HAZY (A)  CLEAR Final   Specific Gravity, Urine 10/31/2024 1.029  1.005 - 1.030 Final   pH 10/31/2024 5.0  5.0 - 8.0 Final   Glucose, UA 10/31/2024 NEGATIVE  NEGATIVE mg/dL Final   Hgb urine dipstick 10/31/2024 NEGATIVE  NEGATIVE Final   Bilirubin Urine 10/31/2024 NEGATIVE  NEGATIVE Final   Ketones, ur 10/31/2024 5 (A)  NEGATIVE mg/dL Final   Protein, ur 87/84/7974 100 (A)  NEGATIVE mg/dL Final   Nitrite 87/84/7974 NEGATIVE  NEGATIVE Final   Leukocytes,Ua 10/31/2024 SMALL (A)  NEGATIVE Final   RBC / HPF 10/31/2024 0-5  0 - 5 RBC/hpf Final   WBC, UA 10/31/2024 11-20  0 - 5 WBC/hpf Final    Bacteria, UA 10/31/2024 RARE (A)  NONE SEEN Final   Squamous Epithelial / HPF 10/31/2024 21-50  0 - 5 /HPF Final   Mucus 10/31/2024 PRESENT   Final   Hyaline Casts, UA 10/31/2024 PRESENT   Final   Ca Oxalate Crys, UA 10/31/2024 PRESENT   Final   Fecal Occult Bld 10/31/2024 NEGATIVE  NEGATIVE Final   Campylobacter species 10/31/2024 NOT DETECTED  NOT DETECTED Final   Plesimonas shigelloides 10/31/2024 NOT DETECTED  NOT DETECTED Final   Salmonella species 10/31/2024 NOT DETECTED  NOT DETECTED Final   Yersinia enterocolitica 10/31/2024 NOT DETECTED  NOT DETECTED Final   Vibrio species 10/31/2024 NOT DETECTED  NOT DETECTED Final   Vibrio cholerae 10/31/2024 NOT DETECTED  NOT DETECTED Final   Enteroaggregative E coli (EAEC) 10/31/2024 NOT DETECTED  NOT DETECTED Final   Enteropathogenic E coli (EPEC) 10/31/2024 NOT DETECTED  NOT DETECTED Final   Enterotoxigenic E coli (ETEC) 10/31/2024 NOT DETECTED  NOT DETECTED Final   Shiga like toxin producing E coli * 10/31/2024 NOT DETECTED  NOT DETECTED Final   Shigella/Enteroinvasive E coli (EI* 10/31/2024 NOT DETECTED  NOT DETECTED Final   Cryptosporidium 10/31/2024 NOT DETECTED  NOT DETECTED Final   Cyclospora cayetanensis 10/31/2024 NOT DETECTED  NOT DETECTED Final   Entamoeba histolytica 10/31/2024 NOT DETECTED  NOT DETECTED Final   Giardia lamblia 10/31/2024 NOT DETECTED  NOT DETECTED Final   Adenovirus F40/41 10/31/2024 NOT DETECTED  NOT DETECTED Final   Astrovirus 10/31/2024 NOT DETECTED  NOT DETECTED Final   Norovirus GI/GII 10/31/2024 NOT DETECTED  NOT DETECTED Final   Rotavirus A 10/31/2024 NOT DETECTED  NOT DETECTED Final   Sapovirus (I, II, IV, and V) 10/31/2024 NOT DETECTED  NOT DETECTED Final  C Diff antigen 10/31/2024 POSITIVE (A)  NEGATIVE Final   C Diff toxin 10/31/2024 NEGATIVE  NEGATIVE Final   C Diff interpretation 10/31/2024 Results are indeterminate. See PCR results.   Final   Toxigenic C. Difficile by PCR 10/31/2024 POSITIVE (A)   NEGATIVE Final   Hypervirulent Strain 10/31/2024 PRESUMPTIVE NEGATIVE  PRESUMPTIVE NEGATIVE Final  Lab on 10/27/2024  Component Date Value Ref Range Status   LACTIC ACID 10/27/2024 1.6  0.4 - 1.8 mmol/L Final   Lipase 10/27/2024 22.0  11.0 - 59.0 U/L Final   Amylase 10/27/2024 24 (L)  27 - 131 U/L Final   Sodium 10/27/2024 138  135 - 145 mEq/L Final   Potassium 10/27/2024 3.1 (L)  3.5 - 5.1 mEq/L Final   Chloride 10/27/2024 102  96 - 112 mEq/L Final   CO2 10/27/2024 24  19 - 32 mEq/L Final   Glucose, Bld 10/27/2024 107 (H)  70 - 99 mg/dL Final   BUN 87/88/7974 9  6 - 23 mg/dL Final   Creatinine, Ser 10/27/2024 0.89  0.40 - 1.20 mg/dL Final   Total Bilirubin 10/27/2024 0.3  0.2 - 1.2 mg/dL Final   Alkaline Phosphatase 10/27/2024 123 (H)  39 - 117 U/L Final   AST 10/27/2024 24  0 - 37 U/L Final   ALT 10/27/2024 19  0 - 35 U/L Final   Total Protein 10/27/2024 7.5  6.0 - 8.3 g/dL Final   Albumin 87/88/7974 4.4  3.5 - 5.2 g/dL Final   GFR 87/88/7974 80.10  >60.00 mL/min Final   Calcium  10/27/2024 9.5  8.4 - 10.5 mg/dL Final   WBC 87/88/7974 5.9  4.0 - 10.5 K/uL Final   RBC 10/27/2024 4.54  3.87 - 5.11 Mil/uL Final   Hemoglobin 10/27/2024 12.4  12.0 - 15.0 g/dL Final   HCT 87/88/7974 37.8  36.0 - 46.0 % Final   MCV 10/27/2024 83.3  78.0 - 100.0 fl Final   MCHC 10/27/2024 32.8  30.0 - 36.0 g/dL Final   RDW 87/88/7974 14.1  11.5 - 15.5 % Final   Platelets 10/27/2024 247.0  150.0 - 400.0 K/uL Final   Neutrophils Relative % 10/27/2024 58.9  43.0 - 77.0 % Final   Lymphocytes Relative 10/27/2024 29.4  12.0 - 46.0 % Final   Monocytes Relative 10/27/2024 7.8  3.0 - 12.0 % Final   Eosinophils Relative 10/27/2024 2.9  0.0 - 5.0 % Final   Basophils Relative 10/27/2024 1.0  0.0 - 3.0 % Final   Neutro Abs 10/27/2024 3.5  1.4 - 7.7 K/uL Final   Lymphs Abs 10/27/2024 1.7  0.7 - 4.0 K/uL Final   Monocytes Absolute 10/27/2024 0.5  0.1 - 1.0 K/uL Final   Eosinophils Absolute 10/27/2024 0.2  0.0 - 0.7  K/uL Final   Basophils Absolute 10/27/2024 0.1  0.0 - 0.1 K/uL Final  Office Visit on 10/19/2024  Component Date Value Ref Range Status   Influenza A, POC 10/19/2024 Negative  Negative Final   Influenza B, POC 10/19/2024 Negative  Negative Final   SARS Coronavirus 2 Ag 10/19/2024 Negative  Negative Final  Admission on 09/01/2024, Discharged on 09/01/2024  Component Date Value Ref Range Status   WBC 09/01/2024 10.1  4.0 - 10.5 K/uL Final   RBC 09/01/2024 4.47  3.87 - 5.11 MIL/uL Final   Hemoglobin 09/01/2024 12.3  12.0 - 15.0 g/dL Final   HCT 89/83/7974 38.4  36.0 - 46.0 % Final   MCV 09/01/2024 85.9  80.0 - 100.0 fL Final  MCH 09/01/2024 27.5  26.0 - 34.0 pg Final   MCHC 09/01/2024 32.0  30.0 - 36.0 g/dL Final   RDW 89/83/7974 14.3  11.5 - 15.5 % Final   Platelets 09/01/2024 310  150 - 400 K/uL Final   nRBC 09/01/2024 0.0  0.0 - 0.2 % Final   Neutrophils Relative % 09/01/2024 90  % Final   Neutro Abs 09/01/2024 9.1 (H)  1.7 - 7.7 K/uL Final   Lymphocytes Relative 09/01/2024 6  % Final   Lymphs Abs 09/01/2024 0.6 (L)  0.7 - 4.0 K/uL Final   Monocytes Relative 09/01/2024 4  % Final   Monocytes Absolute 09/01/2024 0.4  0.1 - 1.0 K/uL Final   Eosinophils Relative 09/01/2024 0  % Final   Eosinophils Absolute 09/01/2024 0.0  0.0 - 0.5 K/uL Final   Basophils Relative 09/01/2024 0  % Final   Basophils Absolute 09/01/2024 0.0  0.0 - 0.1 K/uL Final   Immature Granulocytes 09/01/2024 0  % Final   Abs Immature Granulocytes 09/01/2024 0.04  0.00 - 0.07 K/uL Final   Sodium 09/01/2024 138  135 - 145 mmol/L Final   Potassium 09/01/2024 3.7  3.5 - 5.1 mmol/L Final   Chloride 09/01/2024 108  98 - 111 mmol/L Final   CO2 09/01/2024 18 (L)  22 - 32 mmol/L Final   Glucose, Bld 09/01/2024 143 (H)  70 - 99 mg/dL Final   BUN 89/83/7974 9  6 - 20 mg/dL Final   Creatinine, Ser 09/01/2024 1.01 (H)  0.44 - 1.00 mg/dL Final   Calcium  09/01/2024 9.3  8.9 - 10.3 mg/dL Final   GFR, Estimated 09/01/2024 >60   >60 mL/min Final   Anion gap 09/01/2024 12  5 - 15 Final  Admission on 07/29/2024, Discharged on 07/30/2024  Component Date Value Ref Range Status   Sodium 07/29/2024 136  135 - 145 mmol/L Final   Potassium 07/29/2024 3.8  3.5 - 5.1 mmol/L Final   Chloride 07/29/2024 101  98 - 111 mmol/L Final   CO2 07/29/2024 21 (L)  22 - 32 mmol/L Final   Glucose, Bld 07/29/2024 106 (H)  70 - 99 mg/dL Final   BUN 90/87/7974 7  6 - 20 mg/dL Final   Creatinine, Ser 07/29/2024 0.86  0.44 - 1.00 mg/dL Final   Calcium  07/29/2024 9.1  8.9 - 10.3 mg/dL Final   GFR, Estimated 07/29/2024 >60  >60 mL/min Final   Anion gap 07/29/2024 14  5 - 15 Final   WBC 07/29/2024 8.3  4.0 - 10.5 K/uL Final   RBC 07/29/2024 4.49  3.87 - 5.11 MIL/uL Final   Hemoglobin 07/29/2024 12.1  12.0 - 15.0 g/dL Final   HCT 90/87/7974 38.7  36.0 - 46.0 % Final   MCV 07/29/2024 86.2  80.0 - 100.0 fL Final   MCH 07/29/2024 26.9  26.0 - 34.0 pg Final   MCHC 07/29/2024 31.3  30.0 - 36.0 g/dL Final   RDW 90/87/7974 14.6  11.5 - 15.5 % Final   Platelets 07/29/2024 362  150 - 400 K/uL Final   nRBC 07/29/2024 0.0  0.0 - 0.2 % Final   Neutrophils Relative % 07/29/2024 54  % Final   Neutro Abs 07/29/2024 4.4  1.7 - 7.7 K/uL Final   Lymphocytes Relative 07/29/2024 35  % Final   Lymphs Abs 07/29/2024 3.0  0.7 - 4.0 K/uL Final   Monocytes Relative 07/29/2024 6  % Final   Monocytes Absolute 07/29/2024 0.5  0.1 - 1.0 K/uL Final  Eosinophils Relative 07/29/2024 4  % Final   Eosinophils Absolute 07/29/2024 0.4  0.0 - 0.5 K/uL Final   Basophils Relative 07/29/2024 1  % Final   Basophils Absolute 07/29/2024 0.1  0.0 - 0.1 K/uL Final   Immature Granulocytes 07/29/2024 0  % Final   Abs Immature Granulocytes 07/29/2024 0.02  0.00 - 0.07 K/uL Final  Office Visit on 03/02/2024  Component Date Value Ref Range Status   D-Dimer, Quant 03/02/2024 1.69 (H)  <0.50 mcg/mL FEU Final  Admission on 02/29/2024, Discharged on 03/01/2024  Component Date Value Ref  Range Status   Sodium 02/29/2024 139  135 - 145 mmol/L Final   Potassium 02/29/2024 3.5  3.5 - 5.1 mmol/L Final   Chloride 02/29/2024 103  98 - 111 mmol/L Final   CO2 02/29/2024 26  22 - 32 mmol/L Final   Glucose, Bld 02/29/2024 112 (H)  70 - 99 mg/dL Final   BUN 95/85/7974 9  6 - 20 mg/dL Final   Creatinine, Ser 02/29/2024 0.71  0.44 - 1.00 mg/dL Final   Calcium  02/29/2024 8.8 (L)  8.9 - 10.3 mg/dL Final   GFR, Estimated 02/29/2024 >60  >60 mL/min Final   Anion gap 02/29/2024 10  5 - 15 Final   WBC 02/29/2024 8.9  4.0 - 10.5 K/uL Final   RBC 02/29/2024 4.48  3.87 - 5.11 MIL/uL Final   Hemoglobin 02/29/2024 12.6  12.0 - 15.0 g/dL Final   HCT 95/85/7974 38.6  36.0 - 46.0 % Final   MCV 02/29/2024 86.2  80.0 - 100.0 fL Final   MCH 02/29/2024 28.1  26.0 - 34.0 pg Final   MCHC 02/29/2024 32.6  30.0 - 36.0 g/dL Final   RDW 95/85/7974 13.9  11.5 - 15.5 % Final   Platelets 02/29/2024 313  150 - 400 K/uL Final   nRBC 02/29/2024 0.0  0.0 - 0.2 % Final   Troponin I (High Sensitivity) 02/29/2024 3  <18 ng/L Final   TSH 02/29/2024 1.090  0.350 - 4.500 uIU/mL Final  Office Visit on 02/02/2024  Component Date Value Ref Range Status   Albumin 02/02/2024 4.1  3.9 - 4.9 g/dL Final   Dehydroepiandrosterone 02/02/2024 38  31 - 701 ng/dL Final   LH 96/81/7974 65.0  mIU/mL Final   FSH 02/02/2024 24.3  mIU/mL Final   Prolactin 02/02/2024 16.9  4.8 - 33.4 ng/mL Final   Progesterone  02/02/2024 0.3  ng/mL Final   Estrogen 02/02/2024 276  pg/mL Final   Sex Hormone Binding 02/02/2024 31.8  24.6 - 122.0 nmol/L Final   Vit D, 25-Hydroxy 02/02/2024 44.8  30.0 - 100.0 ng/mL Final  Office Visit on 09/09/2023  Component Date Value Ref Range Status   Rapid Strep A Screen 09/09/2023 Positive (A)  Negative Final   Influenza A, POC 09/09/2023 Negative  Negative Final   Influenza B, POC 09/09/2023 Negative  Negative Final   SARS Coronavirus 2 Ag 09/09/2023 Negative  Negative Final  There may be more visits with  results that are not included.  No image results found. MM 3D SCREENING MAMMOGRAM BILATERAL BREAST Result Date: 11/25/2024 CLINICAL DATA:  Screening. EXAM: DIGITAL SCREENING BILATERAL MAMMOGRAM WITH TOMOSYNTHESIS AND CAD TECHNIQUE: Bilateral screening digital craniocaudal and mediolateral oblique mammograms were obtained. Bilateral screening digital breast tomosynthesis was performed. The images were evaluated with computer-aided detection. COMPARISON:  Previous exam(s). ACR Breast Density Category c: The breasts are heterogeneously dense, which may obscure small masses. FINDINGS: There are no findings suspicious for malignancy. IMPRESSION: No mammographic  evidence of malignancy. A result letter of this screening mammogram will be mailed directly to the patient. RECOMMENDATION: Screening mammogram in one year. (Code:[id number]) BI-RADS CATEGORY  1: Negative. Electronically Signed   By: Alm Parkins M.D.   On: 11/25/2024 08:10   CT ABDOMEN PELVIS W CONTRAST Result Date: 10/31/2024 EXAM: CT ABDOMEN AND PELVIS WITH CONTRAST 10/31/2024 08:05:39 PM TECHNIQUE: CT of the abdomen and pelvis was performed with the administration of 100 mL of iohexol  (OMNIPAQUE ) 300 MG/ML solution. Multiplanar reformatted images are provided for review. Automated exposure control, iterative reconstruction, and/or weight-based adjustment of the mA/kV was utilized to reduce the radiation dose to as low as reasonably achievable. COMPARISON: Abdominal radiographs 10/28/2024 and CT abdomen and pelvis 06/20/2023. CLINICAL HISTORY: Abdominal pain, acute, nonlocalized. Vomiting for 2.5 weeks. Bloody discharge per rectum. Abnormal bowel movements. FINDINGS: LOWER CHEST: Lung bases are clear. LIVER: The liver is unremarkable. GALLBLADDER AND BILE DUCTS: Surgical absence of the gallbladder. No biliary ductal dilatation. SPLEEN: The spleen is unremarkable. PANCREAS: The pancreas is unremarkable. ADRENAL GLANDS: The adrenal glands are unremarkable.  KIDNEYS, URETERS AND BLADDER: The kidneys are unremarkable. No stones in the kidneys or ureters. No hydronephrosis. No perinephric or periureteral stranding. The bladder is decompressed. GI AND BOWEL: Stomach and small bowel are decompressed. Liquid stool demonstrated throughout the colon without wall thickening or inflammatory stranding. No colonic distention. The appendix is normal. PERITONEUM AND RETROPERITONEUM: No ascites. No free air. VASCULATURE: The abdominal aorta is unremarkable. LYMPH NODES: Retroperitoneal lymph nodes are unremarkable. REPRODUCTIVE ORGANS: No acute abnormality. BONES AND SOFT TISSUES: No acute osseous abnormality. No focal soft tissue abnormality. IMPRESSION: 1. Fluid throughout the colon consistent with liquid stool suggesting possible infectious colitis. No wall thickening or obstruction. Electronically signed by: Elsie Gravely MD 10/31/2024 08:15 PM EST RP Workstation: [id number]   DG Abd 2 Views Result Date: 10/31/2024 CLINICAL DATA:  Generalized abdominal pain. Gastrointestinal hemorrhage. EXAM: ABDOMEN - 2 VIEW COMPARISON:  Abdominopelvic CT 06/20/2023 FINDINGS: No free intra-abdominal air. No bowel dilatation or evidence of obstruction. Few bowel air-fluid levels are seen in the right upper quadrant. Small volume of formed stool in the colon. Rounded densities in the right abdomen likely retained enteric contents. There are multiple pelvic phleboliths. Cholecystectomy clips in the right upper quadrant. No visible radiopaque calculi. No acute osseous findings. IMPRESSION: Few bowel air-fluid levels in the right upper quadrant, can be seen with enteritis or diarrheal process. No bowel obstruction or free air. Electronically Signed   By: Andrea Gasman M.D.   On: 10/31/2024 11:39   DG INJECT DIAG/THERA/INC NEEDLE/CATH/PLC EPI/LUMB/SAC W/IMG Result Date: 08/30/2024 CLINICAL DATA:  44 year old female with history of persistent headache after lumbar puncture on 08/26/2024.  EXAM: DG INJECT/THERA INC NEEDLE/CATH/PLC EPI/LUMB/SAC W/IMG PROCEDURE: The procedure, risks, benefits, and alternatives were explained to the patient. Questions regarding the procedure were encouraged and answered. The patient understands and consents to the procedure. LUMBAR EPIDURAL BLOOD PATCH: 15 ml of blood were withdrawn from the patient's antecubital fossa. An epidural approach was taken on the right at L3-L4 using a 20 gauge epidural needle. Epidural positioning was confirmed by injecting a small amount of 1.16ml Isovue -M 200. There was no vascular communication. Five ml of the patient's blood was slowly injected into the epidural space in this location. The procedure was well-tolerated and she was discharged in good condition with instructions to lie down for additional day. IMPRESSION: Lumbar epidural blood patch on the right at L3-L4. Ester Sides, MD [facility]  [facility] Electronically Signed   By: Ester Sides M.D.   On: 08/30/2024 12:12         ASSESSMENT & PLAN   Assessment & Plan Chronic diarrhea Generalized abdominal pain C. 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 True treatment failures are rare, but recurrent C. difficile colitis remains a differential. Discussed fecal microbiota transplantation if symptoms persist without confirmed C. difficile. Ordered NAAT and C. diff EIA to evaluate for recurrence. Will consider a pulsed long course of fidaxomicin  if confirmed. Referred to an infectious disease specialist for further evaluation and potential fecal microbiota transplantation. Prescribed cholestyramine  or colestipol 4 grams three times or  twice daily to bind C. difficile toxin. Supportive care with increased fluid intake and electrolyte replacement encouraged. Abnormal urine High proteins and ketones indicate dehydration, with white blood cells likely from bladder inflammation due to dehydration. Bacteria presence is irrelevant. Rechecked urine analysis. Dehydration Dehydration and electrolyte abnormality secondary to chronic diarrhea   Severe dehydration and electrolyte imbalance likely due to chronic diarrhea, with symptoms of headache and fatigue. Potassium levels likely low. Rechecked potassium levels and blood counts. Increased fluid intake with electrolytes encouraged. Breast lump in female A right breast lump was identified, with a recent 3D mammogram negative. Due to family history of breast cancer, further evaluation is necessary. Ordered a diagnostic breast ultrasound. Obesity (BMI 30-39.9) Participating in a healthy weight and wellness program, with weight loss achieved but complications from rapid weight loss noted. Continue participation in the program. IIH (idiopathic intracranial hypertension) Pseudotumor cerebri   Recent mammogram negative. Emphasized the importance of monitoring the breast lump due to family history of breast cancer. Continue routine health maintenance and monitoring of the breast lump. Headaches likely exacerbated by dehydration, with symptoms including daily headaches and fatigue. Hydration encouraged to alleviate symptoms.  ORDER ASSOCIATIONS  #   DIAGNOSIS / CONDITION ICD-10 ENCOUNTER ORDER     ICD-10-CM   1. Chronic diarrhea  K52.9 Clostridium Difficile by PCR    Clostridium difficile EIA    CBC with Differential/Platelet    Comp Met (CMET)    Ambulatory referral to Infectious Disease    cholestyramine  (QUESTRAN ) 4 g packet    2. C. difficile colitis  A04.72 Clostridium Difficile by PCR    Clostridium difficile EIA    CBC with Differential/Platelet    Comp Met (CMET)    Ambulatory  referral to Infectious Disease    cholestyramine  (QUESTRAN ) 4 g packet    3. Generalized abdominal pain  R10.84 Clostridium Difficile by PCR    Clostridium difficile EIA    CBC with Differential/Platelet    Comp Met (CMET)    Ambulatory referral to Infectious Disease    cholestyramine  (QUESTRAN ) 4 g packet    4. Abnormal urine  R82.90 Urinalysis w microscopic + reflex cultur    Ambulatory referral to Infectious Disease    cholestyramine  (QUESTRAN ) 4 g packet    5. Dehydration  E86.0 cholestyramine  (QUESTRAN ) 4 g packet    6. Breast lump in female  N63.0 US  LIMITED ULTRASOUND INCLUDING AXILLA RIGHT BREAST    MM 3D DIAGNOSTIC MAMMOGRAM UNILATERAL RIGHT BREAST    7. Obesity (BMI 30-39.9)  E66.9     8. IIH (idiopathic intracranial hypertension)  G93.2  Orders Placed in Encounter:   Lab Orders         Clostridium Difficile by PCR         Clostridium difficile EIA         CBC with Differential/Platelet         Comp Met (CMET)         Urinalysis w microscopic + reflex cultur     Imaging Orders         US  LIMITED ULTRASOUND INCLUDING AXILLA RIGHT BREAST         MM 3D DIAGNOSTIC MAMMOGRAM UNILATERAL RIGHT BREAST     Referral Orders         Ambulatory referral to Infectious Disease     Meds ordered this encounter  Medications   cholestyramine  (QUESTRAN ) 4 g packet    Sig: Take 1 packet (4 g total) by mouth 3 (three) times daily with meals.    Dispense:  60 each    Refill:  12   Orders Placed This Encounter  Procedures   Clostridium Difficile by PCR   Clostridium difficile EIA   US  LIMITED ULTRASOUND INCLUDING AXILLA RIGHT BREAST    Standing Status:   Future    Expiration Date:   01/23/2026    Reason for Exam (SYMPTOM  OR DIAGNOSIS REQUIRED):   breast lump lateral right breast    Preferred imaging location?:   [facility] [place]   MM 3D DIAGNOSTIC MAMMOGRAM UNILATERAL RIGHT BREAST    Standing Status:   Future    Expiration Date:   11/25/2025    Scheduling  Instructions:     [facility]    Reason for Exam (SYMPTOM  OR DIAGNOSIS REQUIRED):   brieast lump right lateral    Is the patient pregnant?:   No    Preferred imaging location?:   External   CBC with Differential/Platelet   Comp Met (CMET)   Urinalysis w microscopic + reflex cultur   Ambulatory referral to Infectious Disease    Referral Priority:   Urgent    Referral Type:   Consultation    Referral Reason:   Specialty Services Required    Requested Specialty:   Infectious Diseases    Number of Visits Requested:   1     This document was synthesized by artificial intelligence (Abridge) using HIPAA-compliant recording of the clinical interaction;   We discussed the use of AI scribe software for clinical note transcription with the patient, who gave verbal consent to proceed. additional Info: This encounter employed state-of-the-art, real-time, collaborative documentation. The patient actively reviewed and assisted in updating their electronic medical record on a shared screen, ensuring transparency and facilitating joint problem-solving for the problem list, overview, and plan. This approach promotes accurate, informed care. The treatment plan was discussed and reviewed in detail, including medication safety, potential side effects, and all patient questions. We confirmed understanding and comfort with the plan. Follow-up instructions were established, including contacting the office for any concerns, returning if symptoms worsen, persist, or new symptoms develop, and precautions for potential emergency department visits.

## 2024-11-25 NOTE — Assessment & Plan Note (Signed)
 Pseudotumor cerebri   Recent mammogram negative. Emphasized the importance of monitoring the breast lump due to family history of breast cancer. Continue routine health maintenance and monitoring of the breast lump. Headaches likely exacerbated by dehydration, with symptoms including daily headaches and fatigue. Hydration encouraged to alleviate symptoms.

## 2024-11-25 NOTE — Assessment & Plan Note (Signed)
 High proteins and ketones indicate dehydration, with white blood cells likely from bladder inflammation due to dehydration. Bacteria presence is irrelevant. Rechecked urine analysis.

## 2024-11-25 NOTE — Patient Instructions (Signed)
 It was a pleasure seeing you today! Your health and satisfaction are our top priorities.  Bernardino Cone, MD  VISIT SUMMARY: During your visit, we discussed your persistent diarrhea and abdominal pain, dehydration, abnormal urine findings, breast lump, weight loss, and headaches. We reviewed your recent mammogram results and your participation in a healthy weight and wellness program.  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 We will test for recurrent C. difficile and consider a long course of fidaxomicin  if confirmed. You have been referred to an infectious disease specialist for further evaluation and potential fecal microbiota transplantation. You are prescribed cholestyramine  or colestipol to bind the C. difficile toxin. Please increase your fluid intake and replace electrolytes.  -DEHYDRATION AND ELECTROLYTE ABNORMALITY: Your dehydration and electrolyte imbalance are likely due to chronic diarrhea, causing symptoms like headache and fatigue. We rechecked your potassium levels and blood counts. Please increase your fluid intake with electrolytes.  -ABNORMAL URINE FINDINGS: Your urine showed high proteins and ketones, indicating dehydration. White blood cells are likely from bladder inflammation due to dehydration. We rechecked your urine analysis.  -BREAST LUMP, RIGHT: A lump was found in your right breast, and although your recent mammogram was negative, further evaluation is necessary due to your family history of breast cancer. A diagnostic breast ultrasound has been ordered.  -OBESITY: You are participating in a healthy weight and wellness program and have achieved weight loss. However, rapid weight loss has caused some complications. Please continue with the program.  -PSEUDOTUMOR CEREBRI: Your headaches are likely worsened by dehydration, causing  daily headaches and fatigue. Please stay hydrated to help alleviate these symptoms.  -GENERAL HEALTH MAINTENANCE: Your recent mammogram was negative. It is important to continue monitoring the breast lump due to your family history of breast cancer. Please continue with routine health maintenance and monitoring of the breast lump.  INSTRUCTIONS: Follow up with the infectious disease specialist for further evaluation and potential fecal microbiota transplantation. Complete the ordered tests for C. difficile, potassium levels, blood counts, and urine analysis. Attend the diagnostic breast ultrasound for further evaluation of the breast lump. Continue with the healthy weight and wellness program, and maintain increased fluid intake with electrolytes.  Your Providers PCP: Cone Bernardino MATSU, MD,  [phone]) Referring Provider: Cone Bernardino MATSU, MD,  [phone]) Care Team Provider: Gorge Ade, MD,  [phone]) Care Team Provider: Domenica Reusing, MD,  [phone])  NEXT STEPS: [x]  Early Intervention: Schedule sooner appointment, call our on-call services, or go to emergency room if there is any significant Increase in pain or discomfort New or worsening symptoms Sudden or severe changes in your health [x]  Flexible Follow-Up: We recommend a No follow-ups on file. for optimal routine care. This allows for progress monitoring and treatment adjustments. [x]  Preventive Care: Schedule your annual preventive care visit! It's typically covered by insurance and helps identify potential health issues early. [x]  Lab & X-ray Appointments: Incomplete tests scheduled today, or call to schedule. X-rays: [facility] (M-F, 8:30am-noon or 1pm-5pm). [x]  Medical Information Release: Sign a release form at front desk to obtain relevant medical information we don't have.  MAKING THE MOST OF OUR FOCUSED 20 MINUTE APPOINTMENTS: [x]   Clearly state your top concerns at the beginning of the  visit to focus our discussion [x]   If you anticipate you will need more time, please inform the front desk during scheduling - we can  book multiple appointments in the same week. [x]   If you have transportation problems- use our convenient video appointments or ask about transportation support. [x]   We can get down to business faster if you use MyChart to update information before the visit and submit non-urgent questions before your visit. Thank you for taking the time to provide details through MyChart.  Let our nurse know and she can import this information into your encounter documents.  Arrival and Wait Times: [x]   Arriving on time ensures that everyone receives prompt attention. [x]   Early morning (8a) and afternoon (1p) appointments tend to have shortest wait times. [x]   Unfortunately, we cannot delay appointments for late arrivals or hold slots during phone calls.  Getting Answers and Following Up [x]   Simple Questions & Concerns: For quick questions or basic follow-up after your visit, reach us  at [phone] or MyChart messaging. [x]   Complex Concerns: If your concern is more complex, scheduling an appointment might be best. Discuss this with the staff to find the most suitable option. [x]   Lab & Imaging Results: We'll contact you directly if results are abnormal or you don't use MyChart. Most normal results will be on MyChart within 2-3 business days, with a review message from Dr. Jesus. Haven't heard back in 2 weeks? Need results sooner? Contact us  at [phone]. [x]   Referrals: Our referral coordinator will manage specialist referrals. The specialist's office should contact you within 2 weeks to schedule an appointment. Call us  if you haven't heard from them after 2 weeks.  Staying Connected [x]   MyChart: Activate your MyChart for the fastest way to access results and message us . See the last page of this paperwork for instructions on how to activate.  Bring to Your Next  Appointment [x]   Medications: Please bring all your medication bottles to your next appointment to ensure we have an accurate record of your prescriptions. [x]   Health Diaries: If you're monitoring any health conditions at home, keeping a diary of your readings can be very helpful for discussions at your next appointment.  Billing [x]   X-ray & Lab Orders: These are billed by separate companies. Contact the invoicing company directly for questions or concerns. [x]   Visit Charges: Discuss any billing inquiries with our administrative services team.  Your Satisfaction Matters [x]   Share Your Experience: We strive for your satisfaction! If you have any complaints, or preferably compliments, please let Dr. Jesus know directly or contact our Practice Administrators, Manuelita Rubin or [employer], by asking at the front desk.   Reviewing Your Records [x]   Review this early draft of your clinical encounter notes below and the final encounter summary tomorrow on MyChart after its been completed.  All orders placed so far are visible here: Chronic diarrhea -     Clostridium Difficile by PCR -     Clostridium difficile EIA -     CBC with Differential/Platelet -     Comprehensive metabolic panel with GFR -     Ambulatory referral to Infectious Disease -     Cholestyramine ; Take 1 packet (4 g total) by mouth 3 (three) times daily with meals.  Dispense: 60 each; Refill: 12  C. difficile colitis -     Clostridium Difficile by PCR -     Clostridium difficile EIA -     CBC with Differential/Platelet -     Comprehensive metabolic panel with GFR -     Ambulatory referral to Infectious Disease -     Cholestyramine ;  Take 1 packet (4 g total) by mouth 3 (three) times daily with meals.  Dispense: 60 each; Refill: 12  Generalized abdominal pain -     Clostridium Difficile by PCR -     Clostridium difficile EIA -     CBC with Differential/Platelet -     Comprehensive metabolic panel with GFR -      Ambulatory referral to Infectious Disease -     Cholestyramine ; Take 1 packet (4 g total) by mouth 3 (three) times daily with meals.  Dispense: 60 each; Refill: 12  Abnormal urine -     Urinalysis w microscopic + reflex cultur -     Ambulatory referral to Infectious Disease -     Cholestyramine ; Take 1 packet (4 g total) by mouth 3 (three) times daily with meals.  Dispense: 60 each; Refill: 12  Dehydration -     Cholestyramine ; Take 1 packet (4 g total) by mouth 3 (three) times daily with meals.  Dispense: 60 each; Refill: 12  Breast lump in female -     US  LIMITED ULTRASOUND INCLUDING AXILLA RIGHT BREAST; Future -     MM 3D DIAGNOSTIC MAMMOGRAM UNILATERAL RIGHT BREAST; Future  Obesity (BMI 30-39.9)  IIH (idiopathic intracranial hypertension)

## 2024-11-25 NOTE — Assessment & Plan Note (Signed)
 Participating in a healthy weight and wellness program, with weight loss achieved but complications from rapid weight loss noted. Continue participation in the program.

## 2024-11-26 ENCOUNTER — Encounter: Payer: Self-pay | Admitting: Neurosurgery

## 2024-11-26 ENCOUNTER — Ambulatory Visit: Payer: Self-pay | Admitting: Internal Medicine

## 2024-11-26 DIAGNOSIS — R82998 Other abnormal findings in urine: Secondary | ICD-10-CM | POA: Insufficient documentation

## 2024-11-27 ENCOUNTER — Ambulatory Visit: Payer: Self-pay | Admitting: Internal Medicine

## 2024-11-27 LAB — CULTURE INDICATED

## 2024-11-27 LAB — URINE CULTURE
MICRO NUMBER:: 17451704
SPECIMEN QUALITY:: ADEQUATE

## 2024-11-27 LAB — URINALYSIS W MICROSCOPIC + REFLEX CULTURE
Bacteria, UA: NONE SEEN /HPF
Bilirubin Urine: NEGATIVE
Glucose, UA: NEGATIVE
Hgb urine dipstick: NEGATIVE
Hyaline Cast: NONE SEEN /LPF
Ketones, ur: NEGATIVE
Nitrites, Initial: NEGATIVE
Protein, ur: NEGATIVE
RBC / HPF: NONE SEEN /HPF (ref 0–2)
Specific Gravity, Urine: 1.026 (ref 1.001–1.035)
WBC, UA: NONE SEEN /HPF (ref 0–5)
pH: 5.5 (ref 5.0–8.0)

## 2024-11-28 ENCOUNTER — Other Ambulatory Visit

## 2024-11-30 LAB — CLOSTRIDIUM DIFFICILE EIA: C difficile Toxins A+B, EIA: NEGATIVE

## 2024-11-30 LAB — CLOSTRIDIUM DIFFICILE BY PCR: Toxigenic C. Difficile by PCR: POSITIVE — AB

## 2024-12-01 ENCOUNTER — Encounter: Payer: Self-pay | Admitting: Internal Medicine

## 2024-12-01 ENCOUNTER — Other Ambulatory Visit (HOSPITAL_BASED_OUTPATIENT_CLINIC_OR_DEPARTMENT_OTHER): Payer: Self-pay

## 2024-12-04 ENCOUNTER — Other Ambulatory Visit: Payer: Self-pay | Admitting: Neurology

## 2024-12-05 ENCOUNTER — Telehealth: Payer: Self-pay

## 2024-12-05 ENCOUNTER — Encounter: Payer: Self-pay | Admitting: Internal Medicine

## 2024-12-05 NOTE — Telephone Encounter (Signed)
 Reason for the call pt sent mychart message advised to make appt.Called pt she has flu went to urgent care gave her tamiflu cough is worse no better. Did advised if she gets worse with sob during the evening or night to go to the ED pt understood pt has appt tomorrow with pcp [facility].

## 2024-12-06 ENCOUNTER — Other Ambulatory Visit (HOSPITAL_BASED_OUTPATIENT_CLINIC_OR_DEPARTMENT_OTHER): Payer: Self-pay

## 2024-12-06 ENCOUNTER — Telehealth: Admitting: Internal Medicine

## 2024-12-06 ENCOUNTER — Other Ambulatory Visit: Payer: Self-pay

## 2024-12-06 DIAGNOSIS — J4541 Moderate persistent asthma with (acute) exacerbation: Secondary | ICD-10-CM

## 2024-12-06 MED ORDER — NEBULIZER/TUBING/MOUTHPIECE KIT
1.0000 | PACK | 1 refills | Status: AC | PRN
  Filled 2024-12-06: qty 1, fill #0

## 2024-12-06 MED ORDER — FLUTICASONE PROPIONATE 50 MCG/ACT NA SUSP
2.0000 | Freq: Every day | NASAL | 6 refills | Status: AC
  Filled 2024-12-06: qty 16, 25d supply, fill #0
  Filled 2024-12-06: qty 16, 30d supply, fill #0

## 2024-12-06 MED ORDER — SALINE NASAL SPRAY 0.65 % NA SOLN
NASAL | 11 refills | Status: AC
  Filled 2024-12-06 (×2): qty 44, 30d supply, fill #0

## 2024-12-06 MED ORDER — PROMETHAZINE-DM 6.25-15 MG/5ML PO SYRP
5.0000 mL | ORAL_SOLUTION | Freq: Four times a day (QID) | ORAL | 0 refills | Status: DC | PRN
  Filled 2024-12-06 (×2): qty 118, 6d supply, fill #0

## 2024-12-06 MED ORDER — LEVOCETIRIZINE DIHYDROCHLORIDE 5 MG PO TABS
5.0000 mg | ORAL_TABLET | Freq: Every evening | ORAL | 3 refills | Status: AC
  Filled 2024-12-06 (×2): qty 90, 90d supply, fill #0

## 2024-12-06 MED ORDER — IPRATROPIUM-ALBUTEROL 0.5-2.5 (3) MG/3ML IN SOLN
3.0000 mL | RESPIRATORY_TRACT | 1 refills | Status: AC | PRN
  Filled 2024-12-06: qty 120, 7d supply, fill #0
  Filled 2024-12-06: qty 180, 15d supply, fill #0

## 2024-12-06 NOTE — Patient Instructions (Addendum)
 Here is a prescription for you to take to pharmacy      It was a pleasure seeing you today! Your health and satisfaction are our top priorities.  Bernardino Cone, MD  VISIT SUMMARY: During your visit, we discussed your symptoms, which are suggestive of the flu and concerns about bronchitis. You have experienced high fever, severe fatigue, wheezing, chest pain, and a sore throat. Despite starting Tamiflu, your symptoms have worsened. We also reviewed your history of Clostridioides difficile infection and its impact on your current condition.  YOUR PLAN: -MODERATE PERSISTENT ASTHMA WITH ACUTE EXACERBATION: This means your asthma symptoms have worsened, likely due to bronchitis. Your symptoms include wheezing, chest pain, and difficulty breathing. Your pulse oximetry reading is 93%. We recommend obtaining a nebulizer for home use, continuing with Delsym for cough relief, and using popsicles to soothe your throat. Monitor your vital signs closely and seek hospital care if you experience difficulty breathing, inability to urinate, or if your urine is dark brown. Use saline rinses and nasal sprays for congestion, and take Xyzal  for allergy  symptoms.  -RECENT CLOSTRIDIOIDES DIFFICILE COLITIS: This is a bacterial infection that causes severe diarrhea and other intestinal issues. To prevent recurrence, continue to avoid antibiotics.  INSTRUCTIONS: Please obtain a nebulizer from [facility] for home use. Monitor your vital signs: heart rate over 120, oxygen  under 90, blood pressure under 90 systolic, or temperature over 894Q. Seek hospital care if you experience difficulty breathing, inability to urinate, or if your urine is dark brown. Use saline rinses and nasal sprays for congestion, and take Xyzal  for allergy  symptoms.  Your Providers PCP: Cone Bernardino MATSU, MD,  [phone]) Referring Provider: Cone Bernardino MATSU, MD,  [phone]) Care Team Provider: Gorge Ade, MD,  [phone]) Care Team  Provider: Domenica Reusing, MD,  [phone])  NEXT STEPS: [x]  Early Intervention: Schedule sooner appointment, call our on-call services, or go to emergency room if there is any significant Increase in pain or discomfort New or worsening symptoms Sudden or severe changes in your health [x]  Flexible Follow-Up: We recommend a No follow-ups on file. for optimal routine care. This allows for progress monitoring and treatment adjustments. [x]  Preventive Care: Schedule your annual preventive care visit! It's typically covered by insurance and helps identify potential health issues early. [x]  Lab & X-ray Appointments: Incomplete tests scheduled today, or call to schedule. X-rays: [facility] (M-F, 8:30am-noon or 1pm-5pm). [x]  Medical Information Release: Sign a release form at front desk to obtain relevant medical information we don't have.  MAKING THE MOST OF OUR FOCUSED 20 MINUTE APPOINTMENTS: [x]   Clearly state your top concerns at the beginning of the visit to focus our discussion [x]   If you anticipate you will need more time, please inform the front desk during scheduling - we can book multiple appointments in the same week. [x]   If you have transportation problems- use our convenient video appointments or ask about transportation support. [x]   We can get down to business faster if you use MyChart to update information before the visit and submit non-urgent questions before your visit. Thank you for taking the time to provide details through MyChart.  Let our nurse know and she can import this information into your encounter documents.  Arrival and Wait Times: [x]   Arriving on time ensures that everyone receives prompt attention. [x]   Early morning (8a) and afternoon (1p) appointments tend to have shortest wait times. [x]   Unfortunately, we cannot delay appointments for late arrivals or hold slots during  phone calls.  Getting Answers and Following Up [x]   Simple Questions &  Concerns: For quick questions or basic follow-up after your visit, reach us  at [phone] or MyChart messaging. [x]   Complex Concerns: If your concern is more complex, scheduling an appointment might be best. Discuss this with the staff to find the most suitable option. [x]   Lab & Imaging Results: We'll contact you directly if results are abnormal or you don't use MyChart. Most normal results will be on MyChart within 2-3 business days, with a review message from Dr. Jesus. Haven't heard back in 2 weeks? Need results sooner? Contact us  at [phone]. [x]   Referrals: Our referral coordinator will manage specialist referrals. The specialist's office should contact you within 2 weeks to schedule an appointment. Call us  if you haven't heard from them after 2 weeks.  Staying Connected [x]   MyChart: Activate your MyChart for the fastest way to access results and message us . See the last page of this paperwork for instructions on how to activate.  Bring to Your Next Appointment [x]   Medications: Please bring all your medication bottles to your next appointment to ensure we have an accurate record of your prescriptions. [x]   Health Diaries: If you're monitoring any health conditions at home, keeping a diary of your readings can be very helpful for discussions at your next appointment.  Billing [x]   X-ray & Lab Orders: These are billed by separate companies. Contact the invoicing company directly for questions or concerns. [x]   Visit Charges: Discuss any billing inquiries with our administrative services team.  Your Satisfaction Matters [x]   Share Your Experience: We strive for your satisfaction! If you have any complaints, or preferably compliments, please let Dr. Jesus know directly or contact our Practice Administrators, Manuelita Rubin or [employer], by asking at the front desk.   Reviewing Your Records [x]   Review this early draft of your clinical encounter notes below and the final  encounter summary tomorrow on MyChart after its been completed.  All orders placed so far are visible here: Moderate persistent asthmatic bronchitis with acute exacerbation -     For home use only DME Nebulizer machine -     Nebulizer/Tubing/Mouthpiece; 1 each by Does not apply route every 4 (four) hours as needed.  Dispense: 1 kit; Refill: 1 -     Ipratropium-Albuterol ; Take 3 mLs by nebulization every 4 (four) hours as needed.  Dispense: 120 mL; Refill: 1 -     Fluticasone  Propionate; Place 2 sprays into both nostrils daily.  Dispense: 16 g; Refill: 6 -     Saline Nasal Spray; Use nightly for sinus hygiene long-term. Can also be used as many times daily as desired to assist with cleaning congested sinuses.  Dispense: 44 mL; Refill: 11 -     Promethazine -DM; Take 5 mLs by mouth 4 (four) times daily as needed for cough.  Dispense: 118 mL; Refill: 0 -     Levocetirizine Dihydrochloride ; Take 1 tablet (5 mg total) by mouth every evening.  Dispense: 90 tablet; Refill: 3

## 2024-12-06 NOTE — Progress Notes (Signed)
 ====================================  [facility]: [phone]   --  Virtual Video Medical Office Visit --  Patient: Diane [name]      Age: 44 y.o.       Sex:  female  Date:   12/06/2024 Today's Healthcare Provider: Bernardino KANDICE Cone, MD  ====================================    Chief Complaint/Reason For Visit: Cough (Has really  bad cough and has flu ) Flu and C.diff  Chart reviewed: has Depressive disorder; Anxiety state; Opioid use disorder in remission; IIH (idiopathic intracranial hypertension); back pain, chronic; Knee pain, right, chronic; Ankle pain, chronic; OSA (obstructive sleep apnea); Chronic migraine without aura; Osteochondral lesion; Dysphagia; Intractable pain; Chronic constipation; Other idiopathic scoliosis, lumbar region; Obesity (BMI 30-39.9); Hyperlipidemia; Menopausal syndrome (hot flushes); Prediabetes; Ankle swelling; 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), Choking, Cholelithiasis and acute cholecystitis without obstruction (12/02/2016), Chronic cough (12/27/2013), Depression, Dry mouth (08/12/2017), Dysmenorrhea, Family history of adverse reaction to anesthesia, Gastric ulcer (03/16/2015), GERD (gastroesophageal reflux disease), Headache (07/09/2007), History of gastric ulcer, History of kidney stones, Hyperlipidemia, IBS (irritable bowel  syndrome), Lower extremity edema (07/26/2021), Migraines, Mild obstructive sleep apnea, Nausea and vomiting (12/03/2016), OAB (overactive bladder) (08/12/2017), Osteoarthritis, Osteochondral lesion (09/2016), Osteomyelitis of knee region [facility]), Peripheral vascular disease, Plantar fasciitis of left foot, Pleural effusion (12/15/2013), PONV (postoperative nausea and vomiting), Pseudotumor cerebri, RUQ pain, S/P dilatation of esophageal stricture (07/2018), Sinusitis (08/12/2018), SUI (stress urinary incontinence, female), Thunderclap headache (08/17/2024), Wears contact lenses, and Wears partial dentures. Discussed the use of AI scribe software for clinical note transcription with the patient, who gave verbal consent to proceed.  History of Present Illness 44 year old female who presents with symptoms suggestive of flu and concerns about bronchitis.  She initially experienced symptoms including a high fever reaching 104F last night and 102.84F today, severe fatigue, wheezing, chest pain, and a sore throat from coughing. Despite being started on Tamiflu, her symptoms have worsened. She did not test positive for flu A or B, but was treated based on her symptoms.  She has a history of Clostridioides difficile infection, which has led to increased diarrhea recently. Antibiotics were avoided to prevent recurrence of C. difficile. She gets bronchitis easily and is currently experiencing wheezing.  Her current medications include Delsym for her cough, and she uses popsicles to soothe her throat. She is also taking tapentadol  (Nucynta ) for pain management.  She reports a pulse oximetry reading of 93%.  In terms of her social history, she lives with her mother, who assists in her care, helping with obtaining medications and monitoring her health status.   Medications reviewed: Current Outpatient Medications on File Prior to Visit  Medication Sig   albuterol  (PROVENTIL  HFA;VENTOLIN  HFA) 108 (90 Base)  MCG/ACT inhaler Inhale 2 puffs into the lungs every 6 (six) hours as needed for wheezing or shortness of breath.    ALPRAZolam  (XANAX ) 0.25 MG tablet Take 1 tablet (0.25 mg total) by mouth daily as needed. Use as little as possible and not after 3pm  atorvastatin  (LIPITOR) 40 MG tablet TAKE 1 TABLET BY MOUTH EVERY DAY   Bacillus Coagulans-Inulin (PROBIOTIC) 1-250 BILLION-MG CAPS    cetirizine (ZYRTEC) 10 MG tablet Take by mouth.   cholestyramine  (QUESTRAN ) 4 g packet Take 1 packet (4 g total) by mouth 3 (three) times daily with meals.   diclofenac  Sodium (VOLTAREN ) 1 % GEL Apply 4 g topically 4 (four) times daily.   EMGALITY  120 MG/ML SOAJ Inject 120 mg into the skin every 30 (thirty) days.   Fezolinetant  (VEOZAH ) 45 MG TABS Take 1 tablet (45 mg total) by mouth daily at 6 (six) AM.   fluconazole  (DIFLUCAN ) 150 MG tablet Take 1 tablet (150 mg total) by mouth every 3 (three) days for yeast infection.   fluticasone  (FLONASE ) 50 MCG/ACT nasal spray Place 2 sprays into both nostrils daily.   gabapentin  (NEURONTIN ) 300 MG capsule Take 2 capsules (600 mg total) by mouth 3 (three) times daily.   gabapentin  (NEURONTIN ) 300 MG capsule Take 2 capsules (600 mg total) by mouth 3 (three) times daily.   lisdexamfetamine  (VYVANSE ) 30 MG capsule Take 1 capsule (30 mg total) by mouth daily.   lubiprostone (AMITIZA) 24 MCG capsule TAKE ONE CAPSULE (24 MCG DOSE) BY MOUTH 2 (TWO) TIMES DAILY WITH MEALS.   magnesium  oxide (MAG-OX) 400 MG tablet Take by mouth.   meclizine  (ANTIVERT ) 25 MG tablet Take 1 tablet (25 mg total) by mouth 3 (three) times daily as needed for dizziness.   Multiple Vitamins-Minerals (EQL [facility] WOMENS) TABS    NUCYNTA  100 MG TABS Take 1 tablet by mouth every 8 (eight) hours as needed.   omeprazole (PRILOSEC) 40 MG capsule TAKE 1 (ONE) CAPSUL CAPSULE BY MOUTH DAILY   ondansetron  (ZOFRAN -ODT) 8 MG disintegrating tablet Take 1 tablet (8 mg total) by mouth every 8 (eight) hours as needed.    potassium chloride  SA (KLOR-CON  M) 20 MEQ tablet Take 1 tablet (20 mEq total) by mouth daily.   progesterone  (PROMETRIUM ) 100 MG capsule Take 3 capsules (300 mg total) by mouth daily.   promethazine  (PHENERGAN ) 25 MG tablet Take 1 tablet (25 mg total) by mouth every 8 (eight) hours as needed for nausea or vomiting.   senna-docusate (SENOKOT-S) 8.6-50 MG tablet Take 1 tablet by mouth daily.   Tapentadol  HCl (NUCYNTA ) 100 MG TABS Take 1 tablet (100 mg total) by mouth every 8 (eight) hours as needed.   tiZANidine  (ZANAFLEX ) 4 MG tablet Take 4 mg by mouth every 6 (six) hours as needed for muscle spasms.    traZODone (DESYREL) 50 MG tablet Take 1-2 tablets by mouth at bedtime as needed. (Patient not taking: Reported on 11/25/2024)   triamcinolone  cream (KENALOG ) 0.1 % APPLY TO AFFECTED AREA TWICE A DAY   venlafaxine  XR (EFFEXOR -XR) 150 MG 24 hr capsule TAKE 2 CAPS BY MOUTH DAILY   zolmitriptan  (ZOMIG ) 5 MG tablet TAKE 1 TABLET BY MOUTH AS NEEDED FOR MIGRAINE.   zonisamide  (ZONEGRAN ) 100 MG capsule Take 200 mg by mouth at bedtime.   zonisamide  (ZONEGRAN ) 25 MG capsule Take 1 capsule (25 mg total) by mouth in the morning, at noon, and at bedtime.   Current Facility-Administered Medications on File Prior to Visit  Medication   Erenumab -aooe SOAJ 140 mg  There are no discontinued medications.     Virtual Physical Exam:  General Appearance:  ill-appearing in bed. Well Developed, Well Nourished, No Acute Distress by Limited Video Assessment Pulmonary:  No Respiratory Distress Apparent. Normal Work of Breathing.  Frequent wheezy cough Neurological:  Awake, Alert. No Obvious Focal Neurological Deficits or Cognitive Impairments.  Sensorium Seems Unclouded. Psychiatric:  Appropriate Mood, Pleasant Demeanor, Calm, Articulate, Good Mood        No results found for any visits on 12/06/24. Office Visit on 11/25/2024  Component Date Value   Toxigenic C. Difficile b* 11/28/2024 Positive (A)    C  difficile Toxins A+B, * 11/28/2024 Negative    WBC 11/25/2024 5.2    RBC 11/25/2024 4.73    Hemoglobin 11/25/2024 13.0    HCT 11/25/2024 39.8    MCV 11/25/2024 84.0    MCHC 11/25/2024 32.7    RDW 11/25/2024 14.5    Platelets 11/25/2024 240.0    Neutrophils Relative % 11/25/2024 60.2    Lymphocytes Relative 11/25/2024 30.1    Monocytes Relative 11/25/2024 6.1    Eosinophils Relative 11/25/2024 2.7    Basophils Relative 11/25/2024 0.9    Neutro Abs 11/25/2024 3.1    Lymphs Abs 11/25/2024 1.6    Monocytes Absolute 11/25/2024 0.3    Eosinophils Absolute 11/25/2024 0.1    Basophils Absolute 11/25/2024 0.0    Sodium 11/25/2024 137    Potassium 11/25/2024 4.0    Chloride 11/25/2024 105    CO2 11/25/2024 25    Glucose, Bld 11/25/2024 93    BUN 11/25/2024 11    Creatinine, Ser 11/25/2024 0.79    Total Bilirubin 11/25/2024 0.3    Alkaline Phosphatase 11/25/2024 121 (H)    AST 11/25/2024 19    ALT 11/25/2024 16    Total Protein 11/25/2024 7.7    Albumin 11/25/2024 4.4    GFR 11/25/2024 92.37    Calcium  11/25/2024 9.3    Color, Urine 11/25/2024 YELLOW    APPearance 11/25/2024 CLOUDY (A)    Specific Gravity, Urine 11/25/2024 1.026    pH 11/25/2024 5.5    Glucose, UA 11/25/2024 NEGATIVE    Bilirubin Urine 11/25/2024 NEGATIVE    Ketones, ur 11/25/2024 NEGATIVE    Hgb urine dipstick 11/25/2024 NEGATIVE    Protein, ur 11/25/2024 NEGATIVE    Nitrites, Initial 11/25/2024 NEGATIVE    Leukocyte Esterase 11/25/2024 1+ (A)    WBC, UA 11/25/2024 NONE SEEN    RBC / HPF 11/25/2024 NONE SEEN    Squamous Epithelial / HPF 11/25/2024 10-20 (A)    Bacteria, UA 11/25/2024 NONE SEEN    Calcium  Oxalate Crystal 11/25/2024 MANY (A)    Hyaline Cast 11/25/2024 NONE SEEN    Note 11/25/2024     MICRO NUMBER: 11/25/2024 [id number]    SPECIMEN QUALITY: 11/25/2024 Adequate    Sample Source 11/25/2024 URINE    STATUS: 11/25/2024 FINAL    Result: 11/25/2024                     Value:Mixed genital flora  isolated. These superficial bacteria are not indicative of a urinary tract infection. No further organism identification is warranted on this specimen. If clinically indicated, recollect clean-catch, mid-stream urine and transfer  immediately to Urine Culture Transport Tube.    REFLEXIVE URINE CULTURE 11/25/2024    Lab on 11/08/2024  Component Date Value   Color, Urine 11/08/2024 DARK YELLOW    APPearance 11/08/2024 CLOUDY (A)    Specific Gravity, Urine 11/08/2024 1.031    pH 11/08/2024 5.5    Glucose, UA 11/08/2024 NEGATIVE    Bilirubin Urine 11/08/2024 NEGATIVE    Ketones, ur 11/08/2024 TRACE (A)    Hgb urine dipstick 11/08/2024 NEGATIVE    Protein, ur 11/08/2024 TRACE (A)    Nitrites, Initial  11/08/2024 NEGATIVE    Leukocyte Esterase 11/08/2024 NEGATIVE    WBC, UA 11/08/2024 0-5    RBC / HPF 11/08/2024 NONE SEEN    Squamous Epithelial / HPF 11/08/2024 40-60 (A)    Bacteria, UA 11/08/2024 MODERATE (A)    Calcium  Oxalate Crystal 11/08/2024 MANY (A)    Hyaline Cast 11/08/2024 40-60 (A)    Yeast 11/08/2024 NONE SEEN    Note 11/08/2024     Creatinine, Urine 11/08/2024 251    Protein/Creat Ratio 11/08/2024 100    Protein/Creatinine Ratio 11/08/2024 0.100    Total Protein, Urine 11/08/2024 25 (H)    Magnesium  11/08/2024 1.6    Sodium 11/08/2024 137    Potassium 11/08/2024 3.2 (L)    Chloride 11/08/2024 103    CO2 11/08/2024 23    Glucose, Bld 11/08/2024 107 (H)    BUN 11/08/2024 14    Creatinine, Ser 11/08/2024 0.78    Total Bilirubin 11/08/2024 0.4    Alkaline Phosphatase 11/08/2024 109    AST 11/08/2024 21    ALT 11/08/2024 19    Total Protein 11/08/2024 7.1    Albumin 11/08/2024 4.1    GFR 11/08/2024 93.82    Calcium  11/08/2024 9.3    Reflexve Urine Culture 11/08/2024    Admission on 10/31/2024, Discharged on 11/01/2024  Component Date Value   Lipase 10/31/2024 23    Sodium 10/31/2024 140    Potassium 10/31/2024 2.9 (L)    Chloride 10/31/2024 103    CO2 10/31/2024  23    Glucose, Bld 10/31/2024 94    BUN 10/31/2024 7    Creatinine, Ser 10/31/2024 0.89    Calcium  10/31/2024 9.5    Total Protein 10/31/2024 7.7    Albumin 10/31/2024 4.3    AST 10/31/2024 26    ALT 10/31/2024 19    Alkaline Phosphatase 10/31/2024 141 (H)    Total Bilirubin 10/31/2024 0.3    GFR, Estimated 10/31/2024 >60    Anion gap 10/31/2024 14    WBC 10/31/2024 7.9    RBC 10/31/2024 4.87    Hemoglobin 10/31/2024 13.3    HCT 10/31/2024 41.6    MCV 10/31/2024 85.4    MCH 10/31/2024 27.3    MCHC 10/31/2024 32.0    RDW 10/31/2024 13.6    Platelets 10/31/2024 280    nRBC 10/31/2024 0.0    Color, Urine 10/31/2024 YELLOW    APPearance 10/31/2024 HAZY (A)    Specific Gravity, Urine 10/31/2024 1.029    pH 10/31/2024 5.0    Glucose, UA 10/31/2024 NEGATIVE    Hgb urine dipstick 10/31/2024 NEGATIVE    Bilirubin Urine 10/31/2024 NEGATIVE    Ketones, ur 10/31/2024 5 (A)    Protein, ur 10/31/2024 100 (A)    Nitrite 10/31/2024 NEGATIVE    Leukocytes,Ua 10/31/2024 SMALL (A)    RBC / HPF 10/31/2024 0-5    WBC, UA 10/31/2024 11-20    Bacteria, UA 10/31/2024 RARE (A)    Squamous Epithelial / HPF 10/31/2024 21-50    Mucus 10/31/2024 PRESENT    Hyaline Casts, UA 10/31/2024 PRESENT    Ca Oxalate Crys, UA 10/31/2024 PRESENT    Fecal Occult Bld 10/31/2024 NEGATIVE    Campylobacter species 10/31/2024 NOT DETECTED    Plesimonas shigelloides 10/31/2024 NOT DETECTED    Salmonella species 10/31/2024 NOT DETECTED    Yersinia enterocolitica 10/31/2024 NOT DETECTED    Vibrio species 10/31/2024 NOT DETECTED    Vibrio cholerae 10/31/2024 NOT DETECTED    Enteroaggregative E coli* 10/31/2024 NOT DETECTED    Enteropathogenic  E coli * 10/31/2024 NOT DETECTED    Enterotoxigenic E coli (* 10/31/2024 NOT DETECTED    Shiga like toxin produci* 10/31/2024 NOT DETECTED    Shigella/Enteroinvasive * 10/31/2024 NOT DETECTED    Cryptosporidium 10/31/2024 NOT DETECTED    Cyclospora cayetanensis 10/31/2024  NOT DETECTED    Entamoeba histolytica 10/31/2024 NOT DETECTED    Giardia lamblia 10/31/2024 NOT DETECTED    Adenovirus F40/41 10/31/2024 NOT DETECTED    Astrovirus 10/31/2024 NOT DETECTED    Norovirus GI/GII 10/31/2024 NOT DETECTED    Rotavirus A 10/31/2024 NOT DETECTED    Sapovirus (I, II, IV, an* 10/31/2024 NOT DETECTED    C Diff antigen 10/31/2024 POSITIVE (A)    C Diff toxin 10/31/2024 NEGATIVE    C Diff interpretation 10/31/2024 Results are indeterminate. See PCR results.    Toxigenic C. Difficile b* 10/31/2024 POSITIVE (A)    Hypervirulent Strain 10/31/2024 PRESUMPTIVE NEGATIVE   Lab on 10/27/2024  Component Date Value   LACTIC ACID 10/27/2024 1.6    Lipase 10/27/2024 22.0    Amylase 10/27/2024 24 (L)    Sodium 10/27/2024 138    Potassium 10/27/2024 3.1 (L)    Chloride 10/27/2024 102    CO2 10/27/2024 24    Glucose, Bld 10/27/2024 107 (H)    BUN 10/27/2024 9    Creatinine, Ser 10/27/2024 0.89    Total Bilirubin 10/27/2024 0.3    Alkaline Phosphatase 10/27/2024 123 (H)    AST 10/27/2024 24    ALT 10/27/2024 19    Total Protein 10/27/2024 7.5    Albumin 10/27/2024 4.4    GFR 10/27/2024 80.10    Calcium  10/27/2024 9.5    WBC 10/27/2024 5.9    RBC 10/27/2024 4.54    Hemoglobin 10/27/2024 12.4    HCT 10/27/2024 37.8    MCV 10/27/2024 83.3    MCHC 10/27/2024 32.8    RDW 10/27/2024 14.1    Platelets 10/27/2024 247.0    Neutrophils Relative % 10/27/2024 58.9    Lymphocytes Relative 10/27/2024 29.4    Monocytes Relative 10/27/2024 7.8    Eosinophils Relative 10/27/2024 2.9    Basophils Relative 10/27/2024 1.0    Neutro Abs 10/27/2024 3.5    Lymphs Abs 10/27/2024 1.7    Monocytes Absolute 10/27/2024 0.5    Eosinophils Absolute 10/27/2024 0.2    Basophils Absolute 10/27/2024 0.1   Office Visit on 10/19/2024  Component Date Value   Influenza A, POC 10/19/2024 Negative    Influenza B, POC 10/19/2024 Negative    SARS Coronavirus 2 Ag 10/19/2024 Negative   Admission on  09/01/2024, Discharged on 09/01/2024  Component Date Value   WBC 09/01/2024 10.1    RBC 09/01/2024 4.47    Hemoglobin 09/01/2024 12.3    HCT 09/01/2024 38.4    MCV 09/01/2024 85.9    MCH 09/01/2024 27.5    MCHC 09/01/2024 32.0    RDW 09/01/2024 14.3    Platelets 09/01/2024 310    nRBC 09/01/2024 0.0    Neutrophils Relative % 09/01/2024 90    Neutro Abs 09/01/2024 9.1 (H)    Lymphocytes Relative 09/01/2024 6    Lymphs Abs 09/01/2024 0.6 (L)    Monocytes Relative 09/01/2024 4    Monocytes Absolute 09/01/2024 0.4    Eosinophils Relative 09/01/2024 0    Eosinophils Absolute 09/01/2024 0.0    Basophils Relative 09/01/2024 0    Basophils Absolute 09/01/2024 0.0    Immature Granulocytes 09/01/2024 0    Abs Immature Granulocytes 09/01/2024 0.04    Sodium 09/01/2024  138    Potassium 09/01/2024 3.7    Chloride 09/01/2024 108    CO2 09/01/2024 18 (L)    Glucose, Bld 09/01/2024 143 (H)    BUN 09/01/2024 9    Creatinine, Ser 09/01/2024 1.01 (H)    Calcium  09/01/2024 9.3    GFR, Estimated 09/01/2024 >60    Anion gap 09/01/2024 12   Admission on 07/29/2024, Discharged on 07/30/2024  Component Date Value   Sodium 07/29/2024 136    Potassium 07/29/2024 3.8    Chloride 07/29/2024 101    CO2 07/29/2024 21 (L)    Glucose, Bld 07/29/2024 106 (H)    BUN 07/29/2024 7    Creatinine, Ser 07/29/2024 0.86    Calcium  07/29/2024 9.1    GFR, Estimated 07/29/2024 >60    Anion gap 07/29/2024 14    WBC 07/29/2024 8.3    RBC 07/29/2024 4.49    Hemoglobin 07/29/2024 12.1    HCT 07/29/2024 38.7    MCV 07/29/2024 86.2    MCH 07/29/2024 26.9    MCHC 07/29/2024 31.3    RDW 07/29/2024 14.6    Platelets 07/29/2024 362    nRBC 07/29/2024 0.0    Neutrophils Relative % 07/29/2024 54    Neutro Abs 07/29/2024 4.4    Lymphocytes Relative 07/29/2024 35    Lymphs Abs 07/29/2024 3.0    Monocytes Relative 07/29/2024 6    Monocytes Absolute 07/29/2024 0.5    Eosinophils Relative 07/29/2024 4     Eosinophils Absolute 07/29/2024 0.4    Basophils Relative 07/29/2024 1    Basophils Absolute 07/29/2024 0.1    Immature Granulocytes 07/29/2024 0    Abs Immature Granulocytes 07/29/2024 0.02   Office Visit on 03/02/2024  Component Date Value   D-Dimer, Quant 03/02/2024 1.69 (H)   Admission on 02/29/2024, Discharged on 03/01/2024  Component Date Value   Sodium 02/29/2024 139    Potassium 02/29/2024 3.5    Chloride 02/29/2024 103    CO2 02/29/2024 26    Glucose, Bld 02/29/2024 112 (H)    BUN 02/29/2024 9    Creatinine, Ser 02/29/2024 0.71    Calcium  02/29/2024 8.8 (L)    GFR, Estimated 02/29/2024 >60    Anion gap 02/29/2024 10    WBC 02/29/2024 8.9    RBC 02/29/2024 4.48    Hemoglobin 02/29/2024 12.6    HCT 02/29/2024 38.6    MCV 02/29/2024 86.2    MCH 02/29/2024 28.1    MCHC 02/29/2024 32.6    RDW 02/29/2024 13.9    Platelets 02/29/2024 313    nRBC 02/29/2024 0.0    Troponin I (High Sensiti* 02/29/2024 3    TSH 02/29/2024 1.090   Office Visit on 02/02/2024  Component Date Value   Albumin 02/02/2024 4.1    Dehydroepiandrosterone 02/02/2024 38    LH 02/02/2024 34.9    FSH 02/02/2024 24.3    Prolactin 02/02/2024 16.9    Progesterone  02/02/2024 0.3    Estrogen 02/02/2024 276    Sex Hormone Binding 02/02/2024 31.8    Vit D, 25-Hydroxy 02/02/2024 44.8   There may be more visits with results that are not included.  No image results found. MM 3D SCREENING MAMMOGRAM BILATERAL BREAST Result Date: 11/25/2024 CLINICAL DATA:  Screening. EXAM: DIGITAL SCREENING BILATERAL MAMMOGRAM WITH TOMOSYNTHESIS AND CAD TECHNIQUE: Bilateral screening digital craniocaudal and mediolateral oblique mammograms were obtained. Bilateral screening digital breast tomosynthesis was performed. The images were evaluated with computer-aided detection. COMPARISON:  Previous exam(s). ACR Breast Density Category c: The breasts are heterogeneously  dense, which may obscure small masses. FINDINGS: There are no  findings suspicious for malignancy. IMPRESSION: No mammographic evidence of malignancy. A result letter of this screening mammogram will be mailed directly to the patient. RECOMMENDATION: Screening mammogram in one year. (Code:SM-B-01Y) BI-RADS CATEGORY  1: Negative. Electronically Signed   By: Alm Parkins M.D.   On: 11/25/2024 08:10   CT ABDOMEN PELVIS W CONTRAST Result Date: 10/31/2024 EXAM: CT ABDOMEN AND PELVIS WITH CONTRAST 10/31/2024 08:05:39 PM TECHNIQUE: CT of the abdomen and pelvis was performed with the administration of 100 mL of iohexol  (OMNIPAQUE ) 300 MG/ML solution. Multiplanar reformatted images are provided for review. Automated exposure control, iterative reconstruction, and/or weight-based adjustment of the mA/kV was utilized to reduce the radiation dose to as low as reasonably achievable. COMPARISON: Abdominal radiographs 10/28/2024 and CT abdomen and pelvis 06/20/2023. CLINICAL HISTORY: Abdominal pain, acute, nonlocalized. Vomiting for 2.5 weeks. Bloody discharge per rectum. Abnormal bowel movements. FINDINGS: LOWER CHEST: Lung bases are clear. LIVER: The liver is unremarkable. GALLBLADDER AND BILE DUCTS: Surgical absence of the gallbladder. No biliary ductal dilatation. SPLEEN: The spleen is unremarkable. PANCREAS: The pancreas is unremarkable. ADRENAL GLANDS: The adrenal glands are unremarkable. KIDNEYS, URETERS AND BLADDER: The kidneys are unremarkable. No stones in the kidneys or ureters. No hydronephrosis. No perinephric or periureteral stranding. The bladder is decompressed. GI AND BOWEL: Stomach and small bowel are decompressed. Liquid stool demonstrated throughout the colon without wall thickening or inflammatory stranding. No colonic distention. The appendix is normal. PERITONEUM AND RETROPERITONEUM: No ascites. No free air. VASCULATURE: The abdominal aorta is unremarkable. LYMPH NODES: Retroperitoneal lymph nodes are unremarkable. REPRODUCTIVE ORGANS: No acute abnormality. BONES  AND SOFT TISSUES: No acute osseous abnormality. No focal soft tissue abnormality. IMPRESSION: 1. Fluid throughout the colon consistent with liquid stool suggesting possible infectious colitis. No wall thickening or obstruction. Electronically signed by: Elsie Gravely MD 10/31/2024 08:15 PM EST RP Workstation: [id number]   DG Abd 2 Views Result Date: 10/31/2024 CLINICAL DATA:  Generalized abdominal pain. Gastrointestinal hemorrhage. EXAM: ABDOMEN - 2 VIEW COMPARISON:  Abdominopelvic CT 06/20/2023 FINDINGS: No free intra-abdominal air. No bowel dilatation or evidence of obstruction. Few bowel air-fluid levels are seen in the right upper quadrant. Small volume of formed stool in the colon. Rounded densities in the right abdomen likely retained enteric contents. There are multiple pelvic phleboliths. Cholecystectomy clips in the right upper quadrant. No visible radiopaque calculi. No acute osseous findings. IMPRESSION: Few bowel air-fluid levels in the right upper quadrant, can be seen with enteritis or diarrheal process. No bowel obstruction or free air. Electronically Signed   By: Andrea Gasman M.D.   On: 10/31/2024 11:39  MM 3D SCREENING MAMMOGRAM BILATERAL BREAST Result Date: 11/25/2024 CLINICAL DATA:  Screening. EXAM: DIGITAL SCREENING BILATERAL MAMMOGRAM WITH TOMOSYNTHESIS AND CAD TECHNIQUE: Bilateral screening digital craniocaudal and mediolateral oblique mammograms were obtained. Bilateral screening digital breast tomosynthesis was performed. The images were evaluated with computer-aided detection. COMPARISON:  Previous exam(s). ACR Breast Density Category c: The breasts are heterogeneously dense, which may obscure small masses. FINDINGS: There are no findings suspicious for malignancy. IMPRESSION: No mammographic evidence of malignancy. A result letter of this screening mammogram will be mailed directly to the patient. RECOMMENDATION: Screening mammogram in one year. (Code:SM-B-01Y) BI-RADS CATEGORY   1: Negative. Electronically Signed   By: Alm Parkins M.D.   On: 11/25/2024 08:10        ASSESSMENT & PLAN   Assessment & Plan Moderate persistent asthmatic bronchitis with acute exacerbation Moderate persistent  asthma with acute exacerbation   The acute exacerbation is likely due to bronchitis, with symptoms of wheezing, chest pain, and difficulty breathing. Pulse oximetry is at 93%, with potential need for oxygen  therapy if it drops below 90%. Fever is present but not the primary concern. Antibiotics are avoided due to recent C. difficile colitis. Obtain a nebulizer from [place] for home use. Use Delsym for cough relief and consume popsicles for throat soothing. Monitor vital signs: heart rate over 120, oxygen  under 90, blood pressure under 90 systolic, or temperature over 894Q. Seek hospital care if experiencing difficulty breathing, inability to urinate, or if urine is dark brown. Use saline rinses and nasal sprays for congestion. Take Xyzal  for allergy  symptoms.  Recent Clostridioides difficile colitis   Continue to avoid antibiotics to prevent recurrence of C. difficile colitis.   ORDER ASSOCIATIONS  #   DIAGNOSIS / CONDITION ICD-10 ENCOUNTER ORDER     ICD-10-CM   1. Moderate persistent asthmatic bronchitis with acute exacerbation  J45.41 For home use only DME Nebulizer machine    Respiratory Therapy Supplies (NEBULIZER/TUBING/MOUTHPIECE) KIT    ipratropium-albuterol  (DUONEB) 0.5-2.5 (3) MG/3ML SOLN    fluticasone  (FLONASE ) 50 MCG/ACT nasal spray    sodium chloride  (SALINE MIST) 0.65 % nasal spray    promethazine -dextromethorphan (PROMETHAZINE -DM) 6.25-15 MG/5ML syrup    levocetirizine (XYZAL ) 5 MG tablet           Orders Placed in Encounter:   Lab Orders  No laboratory test(s) ordered today   Imaging Orders  No imaging studies ordered today   Referral Orders  No referral(s) requested today   Meds ordered this encounter  Medications   Respiratory Therapy Supplies  (NEBULIZER/TUBING/MOUTHPIECE) KIT    Sig: 1 each by Does not apply route every 4 (four) hours as needed.    Dispense:  1 kit    Refill:  1   ipratropium-albuterol  (DUONEB) 0.5-2.5 (3) MG/3ML SOLN    Sig: Take 3 mLs by nebulization every 4 (four) hours as needed.    Dispense:  120 mL    Refill:  1   fluticasone  (FLONASE ) 50 MCG/ACT nasal spray    Sig: Place 2 sprays into both nostrils daily.    Dispense:  16 g    Refill:  6   sodium chloride  (SALINE MIST) 0.65 % nasal spray    Sig: Use nightly for sinus hygiene long-term. Can also be used as many times daily as desired to assist with cleaning congested sinuses.    Dispense:  44 mL    Refill:  11   promethazine -dextromethorphan (PROMETHAZINE -DM) 6.25-15 MG/5ML syrup    Sig: Take 5 mLs by mouth 4 (four) times daily as needed for cough.    Dispense:  118 mL    Refill:  0   levocetirizine (XYZAL ) 5 MG tablet    Sig: Take 1 tablet (5 mg total) by mouth every evening.    Dispense:  90 tablet    Refill:  3      Durable Medical Equipment  (From admission, onward)           Start     Ordered   12/06/24 0000  For home use only DME Nebulizer machine  (Order Panel)       Question Answer Comment  Patient needs a nebulizer to treat with the following condition COPD (chronic obstructive pulmonary disease) (HCC)   Length of Need Lifetime      12/06/24 1215  Orders Placed This Encounter  Procedures   For home use only DME Nebulizer machine    Patient needs a nebulizer to treat with the following condition:   COPD (chronic obstructive pulmonary disease) (HCC) [833511]    Length of Need:   Lifetime   ED Discharge Orders          Ordered    For home use only DME Nebulizer machine        12/06/24 1215    Respiratory Therapy Supplies (NEBULIZER/TUBING/MOUTHPIECE) KIT  Every 4 hours PRN        12/06/24 1215    ipratropium-albuterol  (DUONEB) 0.5-2.5 (3) MG/3ML SOLN  Every 4 hours PRN        12/06/24 1215    fluticasone   (FLONASE ) 50 MCG/ACT nasal spray  Daily        12/06/24 1224    sodium chloride  (SALINE MIST) 0.65 % nasal spray  As directed        12/06/24 1224    promethazine -dextromethorphan (PROMETHAZINE -DM) 6.25-15 MG/5ML syrup  4 times daily PRN        12/06/24 1224    levocetirizine (XYZAL ) 5 MG tablet  Every evening        12/06/24 1224                Treatment plan discussed and reviewed in detail. Explained medication safety and potential side effects.  Answered all patient questions and confirmed understanding and comfort with the plan. Encouraged patient to contact our office if they have any questions or concerns.  Agreed on patient coming for a sooner office visit if symptoms worsen, persist, or new symptoms develop. Discussed precautions in case of needing to visit the Emergency Department.    ----------------------------------------------------- Attestation:  Today's Healthcare Provider Bernardino KANDICE Cone, MD was located at office at [facility] at [facility] [address] [place] [address].  The patient was located at home. All video encounter participant identities and locations confirmed visually and verbally.Today's Telemedicine visit was conducted via synchronous Video after consent for telemedicine was obtained:  Video connection was never lost   This document was transcribed and resynthesized, in part, by artificial intelligence (Abridge)  using HIPAA-compliant recording of the clinical interaction;   We have discussed the our use of AI scribe software for clinical note transcription with the patient, who has given verbal consent to proceed.

## 2024-12-07 ENCOUNTER — Encounter: Payer: Self-pay | Admitting: Internal Medicine

## 2024-12-07 ENCOUNTER — Emergency Department (HOSPITAL_COMMUNITY)

## 2024-12-07 ENCOUNTER — Observation Stay (HOSPITAL_COMMUNITY)
Admission: EM | Admit: 2024-12-07 | Discharge: 2024-12-10 | Disposition: A | Discharge status: Home / Self Care | Attending: Internal Medicine | Admitting: Internal Medicine

## 2024-12-07 DIAGNOSIS — J45909 Unspecified asthma, uncomplicated: Secondary | ICD-10-CM | POA: Insufficient documentation

## 2024-12-07 DIAGNOSIS — Z8619 Personal history of other infectious and parasitic diseases: Secondary | ICD-10-CM | POA: Diagnosis not present

## 2024-12-07 DIAGNOSIS — E785 Hyperlipidemia, unspecified: Secondary | ICD-10-CM | POA: Insufficient documentation

## 2024-12-07 DIAGNOSIS — R7303 Prediabetes: Secondary | ICD-10-CM | POA: Insufficient documentation

## 2024-12-07 DIAGNOSIS — H6592 Unspecified nonsuppurative otitis media, left ear: Principal | ICD-10-CM

## 2024-12-07 DIAGNOSIS — F17290 Nicotine dependence, other tobacco product, uncomplicated: Secondary | ICD-10-CM | POA: Insufficient documentation

## 2024-12-07 DIAGNOSIS — F418 Other specified anxiety disorders: Secondary | ICD-10-CM | POA: Insufficient documentation

## 2024-12-07 DIAGNOSIS — R0602 Shortness of breath: Secondary | ICD-10-CM | POA: Diagnosis present

## 2024-12-07 DIAGNOSIS — R739 Hyperglycemia, unspecified: Secondary | ICD-10-CM | POA: Insufficient documentation

## 2024-12-07 DIAGNOSIS — J189 Pneumonia, unspecified organism: Secondary | ICD-10-CM | POA: Diagnosis not present

## 2024-12-07 DIAGNOSIS — R059 Cough, unspecified: Secondary | ICD-10-CM | POA: Diagnosis not present

## 2024-12-07 DIAGNOSIS — U070 Vaping-related disorder: Secondary | ICD-10-CM | POA: Diagnosis not present

## 2024-12-07 DIAGNOSIS — F909 Attention-deficit hyperactivity disorder, unspecified type: Secondary | ICD-10-CM | POA: Insufficient documentation

## 2024-12-07 LAB — COMPREHENSIVE METABOLIC PANEL WITH GFR
ALT: 13 U/L (ref 0–44)
AST: 28 U/L (ref 15–41)
Albumin: 3.8 g/dL (ref 3.5–5.0)
Alkaline Phosphatase: 139 U/L — ABNORMAL HIGH (ref 38–126)
Anion gap: 12 (ref 5–15)
BUN: 11 mg/dL (ref 6–20)
CO2: 22 mmol/L (ref 22–32)
Calcium: 9.1 mg/dL (ref 8.9–10.3)
Chloride: 99 mmol/L (ref 98–111)
Creatinine, Ser: 0.87 mg/dL (ref 0.44–1.00)
GFR, Estimated: >60 mL/min
Glucose, Bld: 89 mg/dL (ref 70–99)
Potassium: 4.4 mmol/L (ref 3.5–5.1)
Sodium: 132 mmol/L — ABNORMAL LOW (ref 135–145)
Total Bilirubin: 0.3 mg/dL (ref 0.0–1.2)
Total Protein: 7.3 g/dL (ref 6.5–8.1)

## 2024-12-07 LAB — CBC WITH DIFFERENTIAL/PLATELET
Abs Immature Granulocytes: 0.01 K/uL (ref 0.00–0.07)
Basophils Absolute: 0 K/uL (ref 0.0–0.1)
Basophils Relative: 0 %
Eosinophils Absolute: 0 K/uL (ref 0.0–0.5)
Eosinophils Relative: 1 %
HCT: 37.2 % (ref 36.0–46.0)
Hemoglobin: 12.4 g/dL (ref 12.0–15.0)
Immature Granulocytes: 0 %
Lymphocytes Relative: 42 %
Lymphs Abs: 1.4 K/uL (ref 0.7–4.0)
MCH: 27.9 pg (ref 26.0–34.0)
MCHC: 33.3 g/dL (ref 30.0–36.0)
MCV: 83.8 fL (ref 80.0–100.0)
Monocytes Absolute: 0.2 K/uL (ref 0.1–1.0)
Monocytes Relative: 7 %
Neutro Abs: 1.7 K/uL (ref 1.7–7.7)
Neutrophils Relative %: 50 %
Platelets: 183 K/uL (ref 150–400)
RBC: 4.44 MIL/uL (ref 3.87–5.11)
RDW: 13.9 % (ref 11.5–15.5)
WBC: 3.4 K/uL — ABNORMAL LOW (ref 4.0–10.5)
nRBC: 0 % (ref 0.0–0.2)

## 2024-12-07 LAB — RESP PANEL BY RT-PCR (RSV, FLU A&B, COVID)  RVPGX2
Influenza A by PCR: NEGATIVE
Influenza B by PCR: NEGATIVE
Resp Syncytial Virus by PCR: NEGATIVE
SARS Coronavirus 2 by RT PCR: NEGATIVE

## 2024-12-07 MED ORDER — ALPRAZOLAM 0.25 MG PO TABS: 0.2500 mg | ORAL_TABLET | Freq: Every day | ORAL | Status: DC | PRN

## 2024-12-07 MED ORDER — SODIUM CHLORIDE 0.9 % IV SOLN
100.0000 mg | Freq: Once | INTRAVENOUS | Status: AC
  Administered 2024-12-07: 100 mg via INTRAVENOUS
  Filled 2024-12-07: qty 100

## 2024-12-07 MED ORDER — SODIUM CHLORIDE 0.9 % IV SOLN
100.0000 mg | Freq: Two times a day (BID) | INTRAVENOUS | Status: DC
  Administered 2024-12-08: 100 mg via INTRAVENOUS
  Filled 2024-12-07: qty 100

## 2024-12-07 MED ORDER — CHOLESTYRAMINE LIGHT 4 G PO PACK
4.0000 g | PACK | Freq: Three times a day (TID) | ORAL | Status: DC
  Filled 2024-12-07 (×6): qty 1

## 2024-12-07 MED ORDER — SODIUM CHLORIDE 0.9 % IV SOLN: 2.0000 g | INTRAVENOUS | Status: DC

## 2024-12-07 MED ORDER — SODIUM CHLORIDE 0.9 % IV SOLN: 500.0000 mg | Freq: Once | INTRAVENOUS | Status: DC

## 2024-12-07 MED ORDER — IPRATROPIUM-ALBUTEROL 0.5-2.5 (3) MG/3ML IN SOLN
3.0000 mL | Freq: Once | RESPIRATORY_TRACT | Status: AC
  Administered 2024-12-07: 3 mL via RESPIRATORY_TRACT
  Filled 2024-12-07: qty 3

## 2024-12-07 MED ORDER — SACCHAROMYCES BOULARDII 250 MG PO CAPS
250.0000 mg | ORAL_CAPSULE | Freq: Two times a day (BID) | ORAL | Status: DC
  Administered 2024-12-07 – 2024-12-10 (×6): 250 mg via ORAL
  Filled 2024-12-07 (×6): qty 1

## 2024-12-07 MED ORDER — IPRATROPIUM-ALBUTEROL 0.5-2.5 (3) MG/3ML IN SOLN
3.0000 mL | Freq: Four times a day (QID) | RESPIRATORY_TRACT | Status: DC
  Administered 2024-12-08 – 2024-12-10 (×11): 3 mL via RESPIRATORY_TRACT
  Filled 2024-12-07 (×10): qty 3

## 2024-12-07 MED ORDER — METHYLPREDNISOLONE SODIUM SUCC 125 MG IJ SOLR
125.0000 mg | INTRAMUSCULAR | Status: AC
  Administered 2024-12-07: 125 mg via INTRAVENOUS
  Filled 2024-12-07: qty 2

## 2024-12-07 MED ORDER — POLYETHYLENE GLYCOL 3350 17 G PO PACK
17.0000 g | PACK | Freq: Every day | ORAL | Status: DC | PRN
  Administered 2024-12-08: 17 g via ORAL
  Filled 2024-12-07: qty 1

## 2024-12-07 MED ORDER — SODIUM CHLORIDE 0.9 % IV SOLN
1.0000 g | Freq: Once | INTRAVENOUS | Status: AC
  Administered 2024-12-08: 1 g via INTRAVENOUS
  Filled 2024-12-07: qty 10

## 2024-12-07 MED ORDER — SODIUM CHLORIDE 0.9 % IV SOLN: INTRAVENOUS | Status: DC

## 2024-12-07 MED ORDER — LEVOCETIRIZINE DIHYDROCHLORIDE 5 MG PO TABS: 5.0000 mg | ORAL_TABLET | Freq: Every evening | ORAL | Status: DC

## 2024-12-07 MED ORDER — DEXAMETHASONE SOD PHOSPHATE PF 10 MG/ML IJ SOLN
10.0000 mg | Freq: Every day | INTRAMUSCULAR | Status: AC
  Administered 2024-12-08 – 2024-12-10 (×3): 10 mg via INTRAVENOUS
  Filled 2024-12-07 (×3): qty 1

## 2024-12-07 MED ORDER — ACETAMINOPHEN 325 MG PO TABS
650.0000 mg | ORAL_TABLET | Freq: Four times a day (QID) | ORAL | Status: AC | PRN
  Administered 2024-12-08 – 2024-12-10 (×4): 650 mg via ORAL
  Filled 2024-12-07 (×4): qty 2

## 2024-12-07 MED ORDER — HYDROCOD POLI-CHLORPHE POLI ER 10-8 MG/5ML PO SUER
5.0000 mL | Freq: Two times a day (BID) | ORAL | Status: AC | PRN
  Administered 2024-12-07 – 2024-12-08 (×2): 5 mL via ORAL
  Filled 2024-12-07 (×2): qty 5

## 2024-12-07 MED ORDER — LORATADINE 10 MG PO TABS
10.0000 mg | ORAL_TABLET | Freq: Every day | ORAL | Status: DC
  Administered 2024-12-08 – 2024-12-09 (×2): 10 mg via ORAL
  Filled 2024-12-07 (×2): qty 1

## 2024-12-07 MED ORDER — IPRATROPIUM-ALBUTEROL 0.5-2.5 (3) MG/3ML IN SOLN
3.0000 mL | RESPIRATORY_TRACT | Status: DC | PRN
  Administered 2024-12-08: 3 mL via RESPIRATORY_TRACT
  Filled 2024-12-07: qty 3

## 2024-12-07 MED ORDER — ACETAMINOPHEN 325 MG PO TABS
650.0000 mg | ORAL_TABLET | Freq: Once | ORAL | Status: AC
  Administered 2024-12-07: 650 mg via ORAL
  Filled 2024-12-07: qty 2

## 2024-12-07 MED ORDER — ATORVASTATIN CALCIUM 40 MG PO TABS
40.0000 mg | ORAL_TABLET | Freq: Every day | ORAL | Status: DC
  Administered 2024-12-08 – 2024-12-10 (×3): 40 mg via ORAL
  Filled 2024-12-07 (×3): qty 1

## 2024-12-07 MED ORDER — SODIUM CHLORIDE 0.9 % IV SOLN
1.0000 g | Freq: Once | INTRAVENOUS | Status: AC
  Administered 2024-12-07: 1 g via INTRAVENOUS
  Filled 2024-12-07: qty 10

## 2024-12-07 MED ORDER — VANCOMYCIN HCL 125 MG PO CAPS
125.0000 mg | ORAL_CAPSULE | Freq: Every day | ORAL | Status: DC
  Administered 2024-12-07 – 2024-12-10 (×4): 125 mg via ORAL
  Filled 2024-12-07 (×4): qty 1

## 2024-12-07 MED ORDER — GABAPENTIN 300 MG PO CAPS
600.0000 mg | ORAL_CAPSULE | Freq: Three times a day (TID) | ORAL | Status: DC
  Administered 2024-12-08 – 2024-12-10 (×9): 600 mg via ORAL
  Filled 2024-12-07 (×9): qty 2

## 2024-12-07 MED ORDER — ZONISAMIDE 100 MG PO CAPS
200.0000 mg | ORAL_CAPSULE | Freq: Every day | ORAL | Status: DC
  Administered 2024-12-08: 200 mg via ORAL
  Filled 2024-12-07: qty 2

## 2024-12-07 MED ORDER — ZONISAMIDE 25 MG PO CAPS
25.0000 mg | ORAL_CAPSULE | Freq: Three times a day (TID) | ORAL | Status: DC
  Administered 2024-12-08: 25 mg via ORAL
  Filled 2024-12-07 (×2): qty 1

## 2024-12-07 MED ORDER — MELATONIN 5 MG PO TABS: 5.0000 mg | ORAL_TABLET | Freq: Every evening | ORAL | Status: DC | PRN

## 2024-12-07 MED ORDER — ONDANSETRON HCL 4 MG/2ML IJ SOLN: 4.0000 mg | Freq: Four times a day (QID) | INTRAMUSCULAR | Status: DC | PRN

## 2024-12-07 MED ORDER — SODIUM CHLORIDE 0.9 % IV BOLUS
1000.0000 mL | Freq: Once | INTRAVENOUS | Status: AC
  Administered 2024-12-07: 1000 mL via INTRAVENOUS

## 2024-12-07 NOTE — ED Triage Notes (Signed)
 Patient reports fever/cough/flu like symptoms for 4 days, syncopal episode in shower, denies hitting head. Recently treated for C diff. Patient is alert and oriented x 4. Airway patent, respirations even and unlabored. Skin normal, warm and dry.

## 2024-12-07 NOTE — ED Notes (Signed)
 Pt refused EKG leads due allergies concerns. Hall Do notified

## 2024-12-07 NOTE — H&P (Incomplete)
 " History and Physical  Diane [name] FMW:[id number] DOB: 09/02/1981 DOA: 12/07/2024  Referring physician: Flint Raring, PA-EDP  PCP: Jesus Bernardino MATSU, MD  Outpatient Specialists: GI. Patient coming from: Home.  Chief Complaint: Shortness of breath and cough.  HPI: Diane [name] is a 44 y.o. female with medical history significant for vape use disorder, asthma, history of C. difficile infection, history of gastric ulcer, IBS, hyperlipidemia, GERD, obesity, who presents to the ER due to worsening shortness of breath and cough.  The patient quit tobacco use and switched to vaping.  Endorses low oxygen  level at home in the 70s and toes turning blue.  She presented to the ER for further evaluation.  In the ER, febrile with Tmax 101, tachycardic 119, and tachypneic 28.  Chest x-ray revealed multifocal airspace process consistent with multifocal pneumonia.  The patient received Rocephin  and IV doxycycline .  TRH, hospitalist service, was asked to admit for CAP.  ED Course: Temperature 98.7.  BP 142/91, pulse 74, respiratory rate 14, O2 saturation 95% on room air.  The patient is wearing acrylic nail polish which can affect readings on fingers pulse ox.  Review of Systems: Review of systems as noted in the HPI. All other systems reviewed and are negative.   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 of being hard to wake up post-op   Gastric ulcer 03/16/2015   GERD (gastroesophageal reflux disease)    Headache 07/09/2007   Qualifier: Diagnosis of   By: Tammie MD, Bruce      Replacing diagnoses that were inactivated after the 02/16/23 regulatory import     History of gastric ulcer    History of kidney stones    Hyperlipidemia    IBS (irritable bowel syndrome)    Lower extremity edema 07/26/2021   Lower extremity edema     Migraines    neurologist-  dr c. hagen ([facility] in [place])-- treated with Emgality  injection every 30 days   Mild obstructive sleep apnea    per study in epic 03-21-2017 mild osa, recommendation cpap, mouth appliance, wt loss   Nausea and vomiting 12/03/2016   OAB (overactive bladder) 08/12/2017   Osteoarthritis    right ankle   Osteochondral lesion 09/2016   right ankle   Osteomyelitis of knee region [facility])    Peripheral vascular disease    NERVE DAMAGE RIGHT LEG AND FOOT DUE TO INJURGY.    Plantar fasciitis of left foot    Pleural effusion 12/15/2013   PONV (postoperative nausea and vomiting)    AND HEADACHES   Pseudotumor cerebri    at back head   RUQ pain    S/P dilatation of esophageal stricture 07/2018   Sinusitis 08/12/2018   RESOLVED WITH ANTIOBIOTIC  SUI (stress urinary incontinence, female)    Thunderclap headache 08/17/2024   Wears contact lenses    Wears partial dentures    upper   Past Surgical History:  Procedure Laterality Date   ANKLE ARTHROSCOPY Right 10/22/2016   Procedure: ANKLE ARTHROSCOPY;  Surgeon: Donnice JONELLE Fees, DPM;  Location: [facility];  Service: Podiatry;  Laterality: Right;  GENERAL/REG BLOCK   ANKLE ARTHROSCOPY Right 09-15-2017   @[facility]    BLADDER SURGERY  child   x 2 - as a child to stretch bladder   CESAREAN SECTION  12/24/2009   CESAREAN SECTION  11/20/2011   Procedure: CESAREAN SECTION;  Surgeon: Charlie JINNY Flowers, MD;  Location: [facility];  Service: Gynecology;   Laterality: N/A;   CHOLECYSTECTOMY N/A 12/03/2016   Procedure: LAPAROSCOPIC CHOLECYSTECTOMY WITH INTRAOPERATIVE CHOLANGIOGRAM;  Surgeon: Krystal Spinner, MD;  Location: [facility];  Service: General;  Laterality: N/A;   CHONDROPLASTY Right 01/12/2024   Procedure: RIGHT KNEE ARTHROSCOPY LATERAL FEMORAL CHONDROPLASTY;  Surgeon: Genelle Standing, MD;  Location: [facility];  Service: Orthopedics;  Laterality: Right;   CYSTOSCOPY WITH RETROGRADE PYELOGRAM, URETEROSCOPY AND STENT PLACEMENT Left 08/10/2006   ESOPHAGOGASTRODUODENOSCOPY (EGD) WITH ESOPHAGEAL DILATION  07/2018   HARDWARE REMOVAL Right 07/01/2016   Procedure: HARDWARE REMOVAL;  Surgeon: Glendia Cordella Hutchinson, MD;  Location: [facility];  Service: Orthopedics;  Laterality: Right;   HARVEST BONE GRAFT Right 01/12/2024   Procedure: RIGHT BONE MARROW ASPIRATE FROM ILIAC CREST DECOMPRESSION AND PLACEMENT RIGHT LATERAL FEMORAL CONDYLE;  Surgeon: Genelle Standing, MD;  Location: [facility];  Service: Orthopedics;  Laterality: Right;   IMPLANTATION, MATRIX-INDUCED AUTOLOGOUS CHONDROCYTES Left 05/24/2024   Procedure: IMPLANTATION, MATRIX-INDUCED AUTOLOGOUS CHONDROCYTES;  Surgeon: Genelle Standing, MD;  Location: [facility];  Service: Orthopedics;  Laterality: Left;  LEFT KNEE MATRIX ASSOCIATED CHONDROCYTE IMPLANTATION   KNEE ARTHROSCOPY Left 05/24/2024   Procedure: ARTHROSCOPY, KNEE;  Surgeon: Genelle Standing, MD;  Location: [facility];  Service: Orthopedics;  Laterality: Left;   KNEE ARTHROSCOPY WITH LATERAL RELEASE Right 12/13/2014   Procedure: KNEE ARTHROSCOPY WITH LATERAL RELEASE;  Surgeon: LELON JONETTA Shari Mickey., MD;  Location: [facility];  Service: Orthopedics;  Laterality: Right;   LAPAROSCOPIC APPENDECTOMY N/A 06/11/2014   Procedure: APPENDECTOMY LAPAROSCOPIC;  Surgeon: Krystal CHRISTELLA Spinner, MD;  Location: [facility];  Service: General;  Laterality: N/A;   ORIF ANKLE FRACTURE Right 01/05/2016   Procedure: OPEN  REDUCTION INTERNAL FIXATION (ORIF) ANKLE FRACTURE;  Surgeon: Glendia Cordella Hutchinson, MD;  Location: [facility];  Service: Orthopedics;  Laterality: Right;   OVARIAN CYST SURGERY Right 03/05/2007   AND CHROMOPERTUBATION  VIA LAPAROSCOPY   ROBOTIC ASSISTED LAPAROSCOPIC HYSTERECTOMY AND SALPINGECTOMY Bilateral 08/27/2018   Procedure: XI ROBOTIC ASSISTED LAPAROSCOPIC HYSTERECTOMY AND BILATERAL SALPINGECTOMY;  Surgeon: Flowers Charlie, MD;  Location: [facility];  Service: Gynecology;  Laterality: Bilateral;  [facility] for 23hr OBS   SYNOVECTOMY Right 12/13/2014   Procedure: PLICA SYNOVECTOMY;  Surgeon: LELON JONETTA Shari Mickey., MD;  Location: [facility];  Service: Orthopedics;  Laterality: Right;   TONSILLECTOMY AND ADENOIDECTOMY  child   WISDOM TOOTH EXTRACTION      Social History:  reports that she has quit smoking. Her smoking use included cigarettes. She has a 7.5 pack-year smoking history. She uses smokeless tobacco. She reports current alcohol use of about 2.0 standard drinks of alcohol per week. She reports that she does not use drugs.   Allergies[1]  Family History  Problem Relation Age of Onset  Learning disabilities Mother    Asthma Mother    Arthritis Mother    Irritable bowel syndrome Mother    Miscarriages / Stillbirths Mother    Multiple myeloma Mother    Hypertension Father    Early death Father    Drug abuse Father    Depression Father    Cancer Father    Arthritis Father    Alcohol abuse Father    Brain cancer Father    Anesthesia problems Father        hard to wake up post-op   Lung cancer Father    Hyperlipidemia Maternal Grandmother    Hypertension Maternal Grandmother    Cancer Maternal Grandmother    Asthma Maternal Grandmother    Arthritis Maternal Grandmother    Breast cancer Maternal Grandmother    Ovarian cancer Maternal Grandmother    Diabetes Maternal Grandmother    Heart disease Maternal Grandmother    Heart attack Maternal Grandmother    Hypertension Maternal  Grandfather    Hyperlipidemia Maternal Grandfather    Arthritis Maternal Grandfather    Alcohol abuse Maternal Grandfather    Cancer Maternal Grandfather    Prostate cancer Maternal Grandfather    Melanoma Maternal Grandfather    Cancer - Prostate Maternal Grandfather    Hypertension Paternal Grandmother    COPD Paternal Grandmother    Arthritis Paternal Grandmother    Learning disabilities Daughter    Learning disabilities Son    Colon cancer Neg Hx       Prior to Admission medications  Medication Sig Start Date End Date Taking? Authorizing Provider  albuterol  (PROVENTIL  HFA;VENTOLIN  HFA) 108 (90 Base) MCG/ACT inhaler Inhale 2 puffs into the lungs every 6 (six) hours as needed for wheezing or shortness of breath.     [provider]  ALPRAZolam  (XANAX ) 0.25 MG tablet Take 1 tablet (0.25 mg total) by mouth daily as needed. Use as little as possible and not after 3pm 11/14/24     atorvastatin  (LIPITOR) 40 MG tablet TAKE 1 TABLET BY MOUTH EVERY DAY 06/03/24   Jesus Bernardino MATSU, MD  Bacillus Coagulans-Inulin (PROBIOTIC) 1-250 BILLION-MG CAPS     [provider]  cetirizine (ZYRTEC) 10 MG tablet Take by mouth. 06/16/17   [provider]  cholestyramine  (QUESTRAN ) 4 g packet Take 1 packet (4 g total) by mouth 3 (three) times daily with meals. 11/25/24   Jesus Bernardino MATSU, MD  diclofenac  Sodium (VOLTAREN ) 1 % GEL Apply 4 g topically 4 (four) times daily. 07/14/24   Genelle Standing, MD  EMGALITY  120 MG/ML SOAJ Inject 120 mg into the skin every 30 (thirty) days. 06/17/24   Jesus Bernardino MATSU, MD  Fezolinetant  (VEOZAH ) 45 MG TABS Take 1 tablet (45 mg total) by mouth daily at 6 (six) AM. 08/12/24   Jesus Bernardino MATSU, MD  fluconazole  (DIFLUCAN ) 150 MG tablet Take 1 tablet (150 mg total) by mouth every 3 (three) days for yeast infection. 10/28/24   Jesus Bernardino MATSU, MD  fluticasone  (FLONASE ) 50 MCG/ACT nasal spray Place 2 sprays into both nostrils daily. 09/09/23   Jesus Bernardino MATSU, MD   fluticasone  (FLONASE ) 50 MCG/ACT nasal spray Place 2 sprays into both nostrils daily. 12/06/24   Jesus Bernardino MATSU, MD  gabapentin  (NEURONTIN ) 300 MG capsule Take 2 capsules (600 mg total) by mouth 3 (three) times daily. 10/28/24 01/26/25  Jesus Bernardino MATSU, MD  gabapentin  (NEURONTIN ) 300 MG capsule Take 2 capsules (600 mg total) by mouth 3 (three) times daily. 11/14/24  ipratropium-albuterol  (DUONEB) 0.5-2.5 (3) MG/3ML SOLN Take 3 mLs by nebulization every 4 (four) hours as needed. 12/06/24   Jesus Bernardino MATSU, MD  levocetirizine (XYZAL ) 5 MG tablet Take 1 tablet (5 mg total) by mouth every evening. 12/06/24   Jesus Bernardino MATSU, MD  lisdexamfetamine  (VYVANSE ) 30 MG capsule Take 1 capsule (30 mg total) by mouth daily. 11/14/24     lubiprostone (AMITIZA) 24 MCG capsule TAKE ONE CAPSULE (24 MCG DOSE) BY MOUTH 2 (TWO) TIMES DAILY WITH MEALS. 10/27/24   Jesus Bernardino MATSU, MD  magnesium  oxide (MAG-OX) 400 MG tablet Take by mouth. 06/16/17   [provider]  meclizine  (ANTIVERT ) 25 MG tablet Take 1 tablet (25 mg total) by mouth 3 (three) times daily as needed for dizziness. 07/30/24   [name], PA-C  Multiple Vitamins-Minerals (EQL [facility] WOMENS) TABS     [provider]  NUCYNTA  100 MG TABS Take 1 tablet by mouth every 8 (eight) hours as needed.    [provider]  omeprazole (PRILOSEC) 40 MG capsule TAKE 1 (ONE) CAPSUL CAPSULE BY MOUTH DAILY 09/19/24   Jesus Bernardino MATSU, MD  ondansetron  (ZOFRAN -ODT) 8 MG disintegrating tablet Take 1 tablet (8 mg total) by mouth every 8 (eight) hours as needed. 10/28/24   Jesus Bernardino MATSU, MD  potassium chloride  SA (KLOR-CON  M) 20 MEQ tablet Take 1 tablet (20 mEq total) by mouth daily. 11/07/24   Jesus Bernardino MATSU, MD  progesterone  (PROMETRIUM ) 100 MG capsule Take 3 capsules (300 mg total) by mouth daily. 06/17/24   Jesus Bernardino MATSU, MD  promethazine  (PHENERGAN ) 25 MG tablet Take 1 tablet (25 mg total) by mouth every 8 (eight) hours as needed for  nausea or vomiting. 11/01/24   Lenor Hollering, MD  promethazine -dextromethorphan (PROMETHAZINE -DM) 6.25-15 MG/5ML syrup Take 5 mLs by mouth 4 (four) times daily as needed for cough. 12/06/24   Jesus Bernardino MATSU, MD  Respiratory Therapy Supplies (NEBULIZER/TUBING/MOUTHPIECE) KIT 1 each by Does not apply route every 4 (four) hours as needed. 12/06/24   Jesus Bernardino MATSU, MD  senna-docusate (SENOKOT-S) 8.6-50 MG tablet Take 1 tablet by mouth daily. 06/20/23   Prosperi, Christian H, PA-C  sodium chloride  (SALINE MIST) 0.65 % nasal spray Use nightly for sinus hygiene long-term. Can also be used as many times daily as desired to assist with cleaning congested sinuses. 12/06/24   Jesus Bernardino MATSU, MD  Tapentadol  HCl (NUCYNTA ) 100 MG TABS Take 1 tablet (100 mg total) by mouth every 8 (eight) hours as needed. 11/14/24     tiZANidine  (ZANAFLEX ) 4 MG tablet Take 4 mg by mouth every 6 (six) hours as needed for muscle spasms.  11/18/16   [provider]  traZODone (DESYREL) 50 MG tablet Take 1-2 tablets by mouth at bedtime as needed. Patient not taking: Reported on 11/25/2024 02/11/24   [provider]  triamcinolone  cream (KENALOG ) 0.1 % APPLY TO AFFECTED AREA TWICE A DAY 08/02/24   Jesus Bernardino MATSU, MD  venlafaxine  XR (EFFEXOR -XR) 150 MG 24 hr capsule TAKE 2 CAPS BY MOUTH DAILY 08/30/24   Jesus Bernardino MATSU, MD  zolmitriptan  (ZOMIG ) 5 MG tablet TAKE 1 TABLET BY MOUTH AS NEEDED FOR MIGRAINE. 12/05/24   Camara, Amadou, MD  zonisamide  (ZONEGRAN ) 100 MG capsule Take 200 mg by mouth at bedtime. 07/18/22   [provider]  zonisamide  (ZONEGRAN ) 25 MG capsule Take 1 capsule (25 mg total) by mouth in the morning, at noon, and at bedtime. 11/01/24   Janjua, Rashid M, MD  Physical Exam: BP (!) 139/92   Pulse (!) 102   Temp (!) 101 F (38.3 C) (Oral)   Resp (!) 28   LMP 08/26/2018   SpO2 95%   General: 45 y.o. year-old female well developed well nourished in no acute distress.  Alert and oriented  x3. Cardiovascular: Regular rate and rhythm with no rubs or gallops.  No thyromegaly or JVD noted.  No lower extremity edema. 2/4 pulses in all 4 extremities. Respiratory: Diffuse wheezing and rales bilaterally.  Poor inspiratory effort. Abdomen: Soft nontender nondistended with normal bowel sounds x4 quadrants. Muskuloskeletal: No cyanosis, clubbing or edema noted bilaterally Neuro: CN II-XII intact, strength, sensation, reflexes Skin: No ulcerative lesions noted or rashes Psychiatry: Judgement and insight appear normal. Mood is appropriate for condition and setting          Labs on Admission:  Basic Metabolic Panel: Recent Labs  Lab 12/07/24 1944  NA 132*  K 4.4  CL 99  CO2 22  GLUCOSE 89  BUN 11  CREATININE 0.87  CALCIUM  9.1   Liver Function Tests: Recent Labs  Lab 12/07/24 1944  AST 28  ALT 13  ALKPHOS 139*  BILITOT 0.3  PROT 7.3  ALBUMIN 3.8   No results for input(s): LIPASE, AMYLASE in the last 168 hours. No results for input(s): AMMONIA in the last 168 hours. CBC: Recent Labs  Lab 12/07/24 1944  WBC 3.4*  NEUTROABS 1.7  HGB 12.4  HCT 37.2  MCV 83.8  PLT 183   Cardiac Enzymes: No results for input(s): CKTOTAL, CKMB, CKMBINDEX, TROPONINI in the last 168 hours.  BNP (last 3 results) No results for input(s): BNP in the last 8760 hours.  ProBNP (last 3 results) No results for input(s): PROBNP in the last 8760 hours.  CBG: No results for input(s): GLUCAP in the last 168 hours.  Radiological Exams on Admission: DG Chest 2 View Result Date: 12/07/2024 CLINICAL DATA:  Cough with fever and flu like symptoms 4 days. EXAM: CHEST - 2 VIEW COMPARISON:  02/29/2024 FINDINGS: Lungs are adequately inflated with multifocal airspace process predominately over the right lung and possibly involving the left base/retrocardiac region. This airspace process worse over the right upper lobe. These findings likely due to multifocal pneumonia. No effusion.  Cardiomediastinal silhouette and remainder of the exam is unchanged. IMPRESSION: Multifocal airspace process likely due to multifocal pneumonia. Electronically Signed   By: Toribio Agreste M.D.   On: 12/07/2024 16:51    EKG: I independently viewed the EKG done and my findings are as followed: None available at the time of this visit.  Assessment/Plan Present on Admission:  CAP (community acquired pneumonia)  Principal Problem:   CAP (community acquired pneumonia)  Multifocal pneumonia in the setting of vaping use, POA Continue Rocephin  and IV doxycycline  Follow sputum culture and peripheral blood cultures x 2. DuoNebs every 6 hours, and every 2 hours as needed for shortness of breath and wheezing IV Decadron  10 mg daily x 3 days As needed antitussives Early mobilization Incentive spirometer.  Vaping use disorder Counseled on the importance of complete cessation of vaping use The patient was receptive and is planning on quitting.  History of C. difficile infection Continue p.o. vancomycin  up to 7 days after completion of antibiotics for pneumonia Continue Florastor twice daily.  Hyperlipidemia Resume home regimen.  Prediabetes with hyperglycemia Last hemoglobin A1c 5.8 on 07/13/2023 Hyperglycemia likely exacerbated by IV steroids Update hemoglobin A1c Insulin  coverage for hyperglycemia.    Time: 75 minutes.  DVT prophylaxis: SCDs, encourage ambulation.  Code Status: Full code.  Family Communication: The patient's mother at bedside.  Disposition Plan: Admitted to telemetry unit.  Consults called: None.  Admission status: Inpatient status.   Status is: Inpatient The patient requires at least 2 midnights for further evaluation and treatment of present condition.   Terry LOISE Hurst MD [facility] Pager [phone]  If 7PM-7AM, please contact night-coverage [email or web address] Password [email or web address]  12/07/2024, 9:11 PM      [1]  Allergies Allergen Reactions    Droperidol  Other (See Comments)    Jittery and restlessness   Metoclopramide  Other (See Comments)    Reglan    Nickel Rash    any jewelry that's not real/ rash and itching    Prochlorperazine Other (See Comments)   Nsaids Other (See Comments)    DUE TO HISTORY OF GASTRIC ULCER   Tolmetin Itching and Other (See Comments)    DUE TO HISTORY OF GASTRIC ULCER  DUE TO HISTORY OF GASTRIC ULCER  DUE TO HISTORY OF GASTRIC ULCER, DUE TO HISTORY OF GASTRIC ULCER   Oxybutynin  Other (See Comments)    severe dry mouth   Adhesive [Tape] Rash   Metoclopramide  Hcl Other (See Comments)    RESTLESSNESS/JITTERY   Prochlorperazine Edisylate Other (See Comments)    RESTLESSNESS/JITTERY    Sulfa Antibiotics Rash   "

## 2024-12-07 NOTE — Telephone Encounter (Signed)
 Tried to call pt to advise her to go to the ed looks like she is there now.

## 2024-12-07 NOTE — ED Notes (Signed)
Hall DO at bedside

## 2024-12-07 NOTE — ED Provider Notes (Signed)
 " [place] [facility] AT [facility] Provider Note   CSN: [id number] Arrival date & time: 12/07/24  1404     Patient presents with: Fever, Cough, and Generalized Body Aches   Diane [name] is a 44 y.o. female.   Patient reports that she has had a cough for the last 4 days.  Patient reports she was seen by her primary care physician and started on Tamiflu.  Patient reports increased weakness today.  Patient reports having a syncopal episode.  Patient reports that she has a home pulse ox and her oxygen  saturations were in the 70s.  Patient reports she was having difficulty breathing.  Patient reports that her toes were blue and her fingertips were blue.  Patient recently had CDF.  The history is provided by the patient and a caregiver. No language interpreter was used.  Fever Associated symptoms: cough   Cough Associated symptoms: fever        Prior to Admission medications  Medication Sig Start Date End Date Taking? Authorizing Provider  albuterol  (PROVENTIL  HFA;VENTOLIN  HFA) 108 (90 Base) MCG/ACT inhaler Inhale 2 puffs into the lungs every 6 (six) hours as needed for wheezing or shortness of breath.     [provider]  ALPRAZolam  (XANAX ) 0.25 MG tablet Take 1 tablet (0.25 mg total) by mouth daily as needed. Use as little as possible and not after 3pm 11/14/24     atorvastatin  (LIPITOR) 40 MG tablet TAKE 1 TABLET BY MOUTH EVERY DAY 06/03/24   Jesus Bernardino MATSU, MD  Bacillus Coagulans-Inulin (PROBIOTIC) 1-250 BILLION-MG CAPS     [provider]  cetirizine (ZYRTEC) 10 MG tablet Take by mouth. 06/16/17   [provider]  cholestyramine  (QUESTRAN ) 4 g packet Take 1 packet (4 g total) by mouth 3 (three) times daily with meals. 11/25/24   Jesus Bernardino MATSU, MD  diclofenac  Sodium (VOLTAREN ) 1 % GEL Apply 4 g topically 4 (four) times daily. 07/14/24   Genelle Standing, MD  EMGALITY  120 MG/ML SOAJ Inject 120 mg into the skin every 30 (thirty) days.  06/17/24   Jesus Bernardino MATSU, MD  Fezolinetant  (VEOZAH ) 45 MG TABS Take 1 tablet (45 mg total) by mouth daily at 6 (six) AM. 08/12/24   Jesus Bernardino MATSU, MD  fluconazole  (DIFLUCAN ) 150 MG tablet Take 1 tablet (150 mg total) by mouth every 3 (three) days for yeast infection. 10/28/24   Jesus Bernardino MATSU, MD  fluticasone  (FLONASE ) 50 MCG/ACT nasal spray Place 2 sprays into both nostrils daily. 09/09/23   Jesus Bernardino MATSU, MD  fluticasone  (FLONASE ) 50 MCG/ACT nasal spray Place 2 sprays into both nostrils daily. 12/06/24   Jesus Bernardino MATSU, MD  gabapentin  (NEURONTIN ) 300 MG capsule Take 2 capsules (600 mg total) by mouth 3 (three) times daily. 10/28/24 01/26/25  Jesus Bernardino MATSU, MD  gabapentin  (NEURONTIN ) 300 MG capsule Take 2 capsules (600 mg total) by mouth 3 (three) times daily. 11/14/24     ipratropium-albuterol  (DUONEB) 0.5-2.5 (3) MG/3ML SOLN Take 3 mLs by nebulization every 4 (four) hours as needed. 12/06/24   Jesus Bernardino MATSU, MD  levocetirizine (XYZAL ) 5 MG tablet Take 1 tablet (5 mg total) by mouth every evening. 12/06/24   Jesus Bernardino MATSU, MD  lisdexamfetamine  (VYVANSE ) 30 MG capsule Take 1 capsule (30 mg total) by mouth daily. 11/14/24     lubiprostone (AMITIZA) 24 MCG capsule TAKE ONE CAPSULE (24 MCG DOSE) BY MOUTH 2 (TWO) TIMES DAILY WITH MEALS. 10/27/24   Jesus Bernardino  G, MD  magnesium  oxide (MAG-OX) 400 MG tablet Take by mouth. 06/16/17   [provider]  meclizine  (ANTIVERT ) 25 MG tablet Take 1 tablet (25 mg total) by mouth 3 (three) times daily as needed for dizziness. 07/30/24   [name], PA-C  Multiple Vitamins-Minerals (EQL CENTUR[facility] WOMENS) TABS     [provider]  NUCYNTA  100 MG TABS Take 1 tablet by mouth every 8 (eight) hours as needed.    [provider]  omeprazole (PRILOSEC) 40 MG capsule TAKE 1 (ONE) CAPSUL CAPSULE BY MOUTH DAILY 09/19/24   Jesus Bernardino MATSU, MD  ondansetron  (ZOFRAN -ODT) 8 MG disintegrating tablet Take 1 tablet (8 mg total) by mouth  every 8 (eight) hours as needed. 10/28/24   Jesus Bernardino MATSU, MD  potassium chloride  SA (KLOR-CON  M) 20 MEQ tablet Take 1 tablet (20 mEq total) by mouth daily. 11/07/24   Jesus Bernardino MATSU, MD  progesterone  (PROMETRIUM ) 100 MG capsule Take 3 capsules (300 mg total) by mouth daily. 06/17/24   Jesus Bernardino MATSU, MD  promethazine  (PHENERGAN ) 25 MG tablet Take 1 tablet (25 mg total) by mouth every 8 (eight) hours as needed for nausea or vomiting. 11/01/24   Lenor Hollering, MD  promethazine -dextromethorphan (PROMETHAZINE -DM) 6.25-15 MG/5ML syrup Take 5 mLs by mouth 4 (four) times daily as needed for cough. 12/06/24   Jesus Bernardino MATSU, MD  Respiratory Therapy Supplies (NEBULIZER/TUBING/MOUTHPIECE) KIT 1 each by Does not apply route every 4 (four) hours as needed. 12/06/24   Jesus Bernardino MATSU, MD  senna-docusate (SENOKOT-S) 8.6-50 MG tablet Take 1 tablet by mouth daily. 06/20/23   Prosperi, Christian H, PA-C  sodium chloride  (SALINE MIST) 0.65 % nasal spray Use nightly for sinus hygiene long-term. Can also be used as many times daily as desired to assist with cleaning congested sinuses. 12/06/24   Jesus Bernardino MATSU, MD  Tapentadol  HCl (NUCYNTA ) 100 MG TABS Take 1 tablet (100 mg total) by mouth every 8 (eight) hours as needed. 11/14/24     tiZANidine  (ZANAFLEX ) 4 MG tablet Take 4 mg by mouth every 6 (six) hours as needed for muscle spasms.  11/18/16   [provider]  traZODone (DESYREL) 50 MG tablet Take 1-2 tablets by mouth at bedtime as needed. Patient not taking: Reported on 11/25/2024 02/11/24   [provider]  triamcinolone  cream (KENALOG ) 0.1 % APPLY TO AFFECTED AREA TWICE A DAY 08/02/24   Jesus Bernardino MATSU, MD  venlafaxine  XR (EFFEXOR -XR) 150 MG 24 hr capsule TAKE 2 CAPS BY MOUTH DAILY 08/30/24   Jesus Bernardino MATSU, MD  zolmitriptan  (ZOMIG ) 5 MG tablet TAKE 1 TABLET BY MOUTH AS NEEDED FOR MIGRAINE. 12/05/24   Camara, Amadou, MD  zonisamide  (ZONEGRAN ) 100 MG capsule Take 200 mg by mouth at bedtime.  07/18/22   [provider]  zonisamide  (ZONEGRAN ) 25 MG capsule Take 1 capsule (25 mg total) by mouth in the morning, at noon, and at bedtime. 11/01/24   Janjua, Rashid M, MD    Allergies: Droperidol , Metoclopramide , Nickel, Prochlorperazine, Nsaids, Tolmetin, Oxybutynin , Adhesive [tape], Metoclopramide  hcl, Prochlorperazine edisylate, and Sulfa antibiotics    Review of Systems  Constitutional:  Positive for fever.  Respiratory:  Positive for cough.   All other systems reviewed and are negative.   Updated Vital Signs BP (!) 139/92   Pulse (!) 102   Temp (!) 101 F (38.3 C) (Oral)   Resp (!) 28   LMP 08/26/2018   SpO2 95%   Physical Exam Vitals reviewed.  Constitutional:  Appearance: Normal appearance.  HENT:     Right Ear: Tympanic membrane normal.     Left Ear: Tympanic membrane normal.     Nose: Nose normal.     Mouth/Throat:     Mouth: Mucous membranes are moist.  Eyes:     Extraocular Movements: Extraocular movements intact.     Pupils: Pupils are equal, round, and reactive to light.  Cardiovascular:     Rate and Rhythm: Normal rate.  Pulmonary:     Breath sounds: Rhonchi present.  Abdominal:     General: Abdomen is flat.  Musculoskeletal:        General: Normal range of motion.     Cervical back: Normal range of motion.  Skin:    General: Skin is warm.  Neurological:     General: No focal deficit present.     Mental Status: She is alert.  Psychiatric:        Mood and Affect: Mood normal.     (all labs ordered are listed, but only abnormal results are displayed) Labs Reviewed  RESP PANEL BY RT-PCR (RSV, FLU A&B, COVID)  RVPGX2  CBC WITH DIFFERENTIAL/PLATELET  COMPREHENSIVE METABOLIC PANEL WITH GFR    EKG: None  Radiology: DG Chest 2 View Result Date: 12/07/2024 CLINICAL DATA:  Cough with fever and flu like symptoms 4 days. EXAM: CHEST - 2 VIEW COMPARISON:  02/29/2024 FINDINGS: Lungs are adequately inflated with multifocal airspace  process predominately over the right lung and possibly involving the left base/retrocardiac region. This airspace process worse over the right upper lobe. These findings likely due to multifocal pneumonia. No effusion. Cardiomediastinal silhouette and remainder of the exam is unchanged. IMPRESSION: Multifocal airspace process likely due to multifocal pneumonia. Electronically Signed   By: Toribio Agreste M.D.   On: 12/07/2024 16:51     Procedures   Medications Ordered in the ED  cefTRIAXone  (ROCEPHIN ) 1 g in sodium chloride  0.9 % 100 mL IVPB (1 g Intravenous New Bag/Given 12/07/24 1941)  doxycycline  (VIBRAMYCIN ) 100 mg in sodium chloride  0.9 % 250 mL IVPB (has no administration in time range)  acetaminophen  (TYLENOL ) tablet 650 mg (650 mg Oral Given 12/07/24 1728)  sodium chloride  0.9 % bolus 1,000 mL (1,000 mLs Intravenous New Bag/Given 12/07/24 1942)                                    Medical Decision Making Patient reports that she has been sick for the past 4 days.  Patient is concerned that she has pneumonia.  Patient recently was diagnosed with C. difficile.  Patient reports that she had an episode of passing out today at home.  Patient's family reports patient's oxygen  level was in the 70s  Amount and/or Complexity of Data Reviewed Labs: ordered. Decision-making details documented in ED Course.    Details: Influenza, covid and rsv are negative  Radiology: ordered and independent interpretation performed. Decision-making details documented in ED Course.    Details: Chest xray shows multi focal pneumonia Discussion of management or test interpretation with external provider(s): I discussed the pt with Hospitalist Dr. Shona who will admit  Risk OTC drugs.        Final diagnoses:  Pneumonia due to infectious organism, unspecified laterality, unspecified part of lung    ED Discharge Orders     None          Flint Sonny POUR, [place] 12/07/24 2111  "

## 2024-12-08 ENCOUNTER — Other Ambulatory Visit: Payer: Self-pay | Admitting: Internal Medicine

## 2024-12-08 DIAGNOSIS — J189 Pneumonia, unspecified organism: Secondary | ICD-10-CM | POA: Diagnosis not present

## 2024-12-08 DIAGNOSIS — E782 Mixed hyperlipidemia: Secondary | ICD-10-CM

## 2024-12-08 LAB — CBG MONITORING, ED: Glucose-Capillary: 228 mg/dL — ABNORMAL HIGH (ref 70–99)

## 2024-12-08 LAB — CBC WITH DIFFERENTIAL/PLATELET
Abs Immature Granulocytes: 0.01 K/uL (ref 0.00–0.07)
Basophils Absolute: 0 K/uL (ref 0.0–0.1)
Basophils Relative: 0 %
Eosinophils Absolute: 0 K/uL (ref 0.0–0.5)
Eosinophils Relative: 0 %
HCT: 36 % (ref 36.0–46.0)
Hemoglobin: 11.3 g/dL — ABNORMAL LOW (ref 12.0–15.0)
Immature Granulocytes: 0 %
Lymphocytes Relative: 15 %
Lymphs Abs: 0.5 K/uL — ABNORMAL LOW (ref 0.7–4.0)
MCH: 27.2 pg (ref 26.0–34.0)
MCHC: 31.4 g/dL (ref 30.0–36.0)
MCV: 86.5 fL (ref 80.0–100.0)
Monocytes Absolute: 0.1 K/uL (ref 0.1–1.0)
Monocytes Relative: 3 %
Neutro Abs: 2.4 K/uL (ref 1.7–7.7)
Neutrophils Relative %: 82 %
Platelets: 169 K/uL (ref 150–400)
RBC: 4.16 MIL/uL (ref 3.87–5.11)
RDW: 13.9 % (ref 11.5–15.5)
Smear Review: NORMAL
WBC: 3 K/uL — ABNORMAL LOW (ref 4.0–10.5)
nRBC: 0 % (ref 0.0–0.2)

## 2024-12-08 LAB — BASIC METABOLIC PANEL WITH GFR
Anion gap: 12 (ref 5–15)
BUN: 12 mg/dL (ref 6–20)
CO2: 21 mmol/L — ABNORMAL LOW (ref 22–32)
Calcium: 8.8 mg/dL — ABNORMAL LOW (ref 8.9–10.3)
Chloride: 103 mmol/L (ref 98–111)
Creatinine, Ser: 0.74 mg/dL (ref 0.44–1.00)
GFR, Estimated: >60 mL/min
Glucose, Bld: 183 mg/dL — ABNORMAL HIGH (ref 70–99)
Potassium: 3.7 mmol/L (ref 3.5–5.1)
Sodium: 136 mmol/L (ref 135–145)

## 2024-12-08 LAB — HEMOGLOBIN A1C
Hgb A1c MFr Bld: 6.1 % — ABNORMAL HIGH (ref 4.8–5.6)
Mean Plasma Glucose: 128.37 mg/dL

## 2024-12-08 LAB — GLUCOSE, CAPILLARY
Glucose-Capillary: 165 mg/dL — ABNORMAL HIGH (ref 70–99)
Glucose-Capillary: 221 mg/dL — ABNORMAL HIGH (ref 70–99)
Glucose-Capillary: 133 mg/dL — ABNORMAL HIGH (ref 70–99)

## 2024-12-08 LAB — PHOSPHORUS: Phosphorus: 2.2 mg/dL — ABNORMAL LOW (ref 2.5–4.6)

## 2024-12-08 LAB — MAGNESIUM: Magnesium: 2 mg/dL (ref 1.7–2.4)

## 2024-12-08 MED ORDER — ZONISAMIDE 25 MG PO CAPS
150.0000 mg | ORAL_CAPSULE | Freq: Every day | ORAL | Status: DC
  Administered 2024-12-08 – 2024-12-09 (×2): 150 mg via ORAL
  Filled 2024-12-08 (×3): qty 2

## 2024-12-08 MED ORDER — POTASSIUM PHOSPHATES 15 MMOLE/5ML IV SOLN
30.0000 mmol | Freq: Once | INTRAVENOUS | Status: DC
  Filled 2024-12-08: qty 10

## 2024-12-08 MED ORDER — SODIUM CHLORIDE 0.9 % IV SOLN
2.0000 g | INTRAVENOUS | Status: DC
  Administered 2024-12-08 – 2024-12-09 (×2): 2 g via INTRAVENOUS
  Filled 2024-12-08 (×2): qty 20

## 2024-12-08 MED ORDER — VENLAFAXINE HCL ER 150 MG PO CP24
300.0000 mg | ORAL_CAPSULE | Freq: Every day | ORAL | Status: DC
  Administered 2024-12-09 – 2024-12-10 (×2): 300 mg via ORAL
  Filled 2024-12-08 (×2): qty 2

## 2024-12-08 MED ORDER — DOXYCYCLINE HYCLATE 100 MG PO TABS
100.0000 mg | ORAL_TABLET | Freq: Two times a day (BID) | ORAL | Status: DC
  Administered 2024-12-08 – 2024-12-10 (×5): 100 mg via ORAL
  Filled 2024-12-08 (×5): qty 1

## 2024-12-08 MED ORDER — K PHOS MONO-SOD PHOS DI & MONO 155-852-130 MG PO TABS
250.0000 mg | ORAL_TABLET | Freq: Three times a day (TID) | ORAL | Status: AC
  Administered 2024-12-08 (×3): 250 mg via ORAL
  Filled 2024-12-08 (×4): qty 1

## 2024-12-08 MED ORDER — PROGESTERONE MICRONIZED 100 MG PO CAPS
300.0000 mg | ORAL_CAPSULE | Freq: Every day | ORAL | Status: DC
  Administered 2024-12-08 – 2024-12-10 (×3): 300 mg via ORAL
  Filled 2024-12-08 (×3): qty 3

## 2024-12-08 MED ORDER — FEZOLINETANT 45 MG PO TABS: 1.0000 | ORAL_TABLET | Freq: Every day | ORAL | Status: DC

## 2024-12-08 MED ORDER — OXYCODONE HCL 5 MG PO TABS
5.0000 mg | ORAL_TABLET | ORAL | Status: AC | PRN
  Administered 2024-12-08 – 2024-12-09 (×6): 10 mg via ORAL
  Filled 2024-12-08 (×6): qty 2

## 2024-12-08 MED ORDER — ENOXAPARIN SODIUM 40 MG/0.4ML IJ SOSY
40.0000 mg | PREFILLED_SYRINGE | INTRAMUSCULAR | Status: DC
  Administered 2024-12-08 – 2024-12-09 (×2): 40 mg via SUBCUTANEOUS
  Filled 2024-12-08 (×2): qty 0.4

## 2024-12-08 MED ORDER — LISDEXAMFETAMINE DIMESYLATE 30 MG PO CAPS: 30.0000 mg | ORAL_CAPSULE | Freq: Every day | ORAL | Status: DC

## 2024-12-08 MED ORDER — INSULIN ASPART 100 UNIT/ML IJ SOLN: 0.0000 [IU] | Freq: Every day | INTRAMUSCULAR | Status: DC

## 2024-12-08 MED ORDER — MORPHINE SULFATE (PF) 2 MG/ML IV SOLN
2.0000 mg | INTRAVENOUS | Status: AC
  Administered 2024-12-08: 2 mg via INTRAVENOUS
  Filled 2024-12-08: qty 1

## 2024-12-08 MED ORDER — INSULIN ASPART 100 UNIT/ML IJ SOLN
0.0000 [IU] | Freq: Three times a day (TID) | INTRAMUSCULAR | Status: DC
  Administered 2024-12-08 (×2): 3 [IU] via SUBCUTANEOUS
  Administered 2024-12-09 (×2): 1 [IU] via SUBCUTANEOUS
  Filled 2024-12-08: qty 4
  Filled 2024-12-08: qty 2
  Filled 2024-12-08: qty 1
  Filled 2024-12-08: qty 2

## 2024-12-08 NOTE — Progress Notes (Addendum)
 " Progress Note   Patient: Diane [name] FMW:[id number] DOB: December 19, 1980 DOA: 12/07/2024     1 DOS: the patient was seen and examined on 12/08/2024    Brief hospital course: Diane [name] is a 44 y.o. female with medical history significant for vape use disorder, asthma, history of C. difficile infection, history of gastric ulcer, IBS, hyperlipidemia, GERD, obesity, who presents to the ER due to worsening shortness of breath and cough.  The patient quit tobacco use and switched to vaping.  Endorses low oxygen  level at home in the 70s and toes turning blue.  She presented to the ER for further evaluation and was found to have multifocal pneumonia.    Assessment and Plan:  Multifocal pneumonia in the setting of vaping use, POA Continue Rocephin  and doxycycline  Follow sputum culture and peripheral blood cultures x 2. DuoNebs every 6 hours, and every 2 hours as needed for shortness of breath and wheezing IV Decadron  10 mg daily x 3 days As needed antitussives Early mobilization Incentive spirometer.  Left ear pain Tympanic membranes appear normal on otoscopy. - Continue above antibiotics.   Vaping use disorder Counseled on the importance of complete cessation of vaping use The patient was receptive and is planning on quitting.   History of C. difficile infection Started on prophylactic p.o. vancomycin  and will continue up to 7 days after completion of antibiotics for pneumonia Continue Florastor twice daily.   Hyperlipidemia Resume home regimen.   Prediabetes with hyperglycemia Last hemoglobin A1c 5.8 on 07/13/2023 Hyperglycemia likely exacerbated by IV steroids Update hemoglobin A1c Insulin  coverage for hyperglycemia.    ADHD On Vyvanse  at home. Not on hospital formulary. - Will hold for now.  Depression/anxiety - Continue home as needed Xanax . -Continue home Effexor  - Hold home Veozah , as not on formulary.       Subjective: Patient complains of left ear pain.  She  has been coughing a lot.  Physical Exam: BP (!) 140/84 (BP Location: Left Arm)   Pulse 84   Temp 98.4 F (36.9 C) (Oral)   Resp 19   LMP 08/26/2018   SpO2 98%     General: Alert, oriented X3  Eyes: Pupils equal, reactive  Oral cavity: moist mucous membranes  Head: Atraumatic, normocephalic  Neck: supple  Chest: Diminished breath sounds CVS: S1,S2 RRR. No murmurs  Abd: No distention, soft, non-tender. No masses palpable  Extr: No edema   MSK: No joint deformities or swelling  Neurological: Grossly intact.    Data Reviewed:    Latest Ref Rng & Units 12/08/2024    3:00 AM 12/07/2024    7:44 PM 11/25/2024    9:45 AM  CBC  WBC 4.0 - 10.5 K/uL 3.0  3.4  5.2   Hemoglobin 12.0 - 15.0 g/dL 88.6  87.5  86.9   Hematocrit 36.0 - 46.0 % 36.0  37.2  39.8   Platelets 150 - 400 K/uL 169  183  240.0       Latest Ref Rng & Units 12/08/2024    3:00 AM 12/07/2024    7:44 PM 11/25/2024    9:45 AM  BMP  Glucose 70 - 99 mg/dL 816  89  93   BUN 6 - 20 mg/dL 12  11  11    Creatinine 0.44 - 1.00 mg/dL 9.25  9.12  9.20   Sodium 135 - 145 mmol/L 136  132  137   Potassium 3.5 - 5.1 mmol/L 3.7  4.4  4.0   Chloride 98 -  111 mmol/L 103  99  105   CO2 22 - 32 mmol/L 21  22  25    Calcium  8.9 - 10.3 mg/dL 8.8  9.1  9.3      Family Communication: n/a  Disposition: Status is: Observation DVT PPx: SQ lovenox       Author: MDALA-GAUSI, Aldonia Keeven AGATHA, MD 12/08/2024 2:36 PM  For on call review [email or web address].    "

## 2024-12-08 NOTE — ED Notes (Addendum)
 SABRA

## 2024-12-08 NOTE — ED Notes (Signed)
 Pt with some shob, PRN duoneb given.

## 2024-12-08 NOTE — ED Notes (Signed)
 Pt states IV is painful. IV stopped and IV team consult placed.

## 2024-12-09 ENCOUNTER — Other Ambulatory Visit: Payer: Self-pay

## 2024-12-09 ENCOUNTER — Encounter (HOSPITAL_COMMUNITY): Payer: Self-pay | Admitting: Internal Medicine

## 2024-12-09 ENCOUNTER — Ambulatory Visit: Admitting: Obstetrics and Gynecology

## 2024-12-09 DIAGNOSIS — J189 Pneumonia, unspecified organism: Secondary | ICD-10-CM | POA: Diagnosis not present

## 2024-12-09 LAB — CBC
HCT: 33.7 % — ABNORMAL LOW (ref 36.0–46.0)
Hemoglobin: 10.4 g/dL — ABNORMAL LOW (ref 12.0–15.0)
MCH: 26.9 pg (ref 26.0–34.0)
MCHC: 30.9 g/dL (ref 30.0–36.0)
MCV: 87.1 fL (ref 80.0–100.0)
Platelets: 194 K/uL (ref 150–400)
RBC: 3.87 MIL/uL (ref 3.87–5.11)
RDW: 14.3 % (ref 11.5–15.5)
WBC: 4.1 K/uL (ref 4.0–10.5)
nRBC: 0 % (ref 0.0–0.2)

## 2024-12-09 LAB — MISC LABCORP TEST (SEND OUT): Labcorp test code: 83935

## 2024-12-09 LAB — BASIC METABOLIC PANEL WITH GFR
Anion gap: 12 (ref 5–15)
BUN: 9 mg/dL (ref 6–20)
CO2: 23 mmol/L (ref 22–32)
Calcium: 9.4 mg/dL (ref 8.9–10.3)
Chloride: 107 mmol/L (ref 98–111)
Creatinine, Ser: 0.61 mg/dL (ref 0.44–1.00)
GFR, Estimated: >60 mL/min
Glucose, Bld: 138 mg/dL — ABNORMAL HIGH (ref 70–99)
Potassium: 3.5 mmol/L (ref 3.5–5.1)
Sodium: 141 mmol/L (ref 135–145)

## 2024-12-09 LAB — PHOSPHORUS: Phosphorus: 4.7 mg/dL — ABNORMAL HIGH (ref 2.5–4.6)

## 2024-12-09 LAB — GLUCOSE, CAPILLARY
Glucose-Capillary: 130 mg/dL — ABNORMAL HIGH (ref 70–99)
Glucose-Capillary: 125 mg/dL — ABNORMAL HIGH (ref 70–99)
Glucose-Capillary: 147 mg/dL — ABNORMAL HIGH (ref 70–99)
Glucose-Capillary: 169 mg/dL — ABNORMAL HIGH (ref 70–99)

## 2024-12-09 MED ORDER — OFLOXACIN 0.3 % OP SOLN
5.0000 [drp] | Freq: Every day | OPHTHALMIC | Status: DC
  Administered 2024-12-09 – 2024-12-10 (×2): 5 [drp] via OTIC
  Filled 2024-12-09: qty 5

## 2024-12-09 MED ORDER — KETOROLAC TROMETHAMINE 15 MG/ML IJ SOLN
15.0000 mg | Freq: Four times a day (QID) | INTRAMUSCULAR | Status: AC
  Filled 2024-12-09: qty 1

## 2024-12-09 MED ORDER — GUAIFENESIN ER 600 MG PO TB12
1200.0000 mg | ORAL_TABLET | Freq: Two times a day (BID) | ORAL | Status: DC
  Administered 2024-12-09 – 2024-12-10 (×3): 1200 mg via ORAL
  Filled 2024-12-09 (×3): qty 2

## 2024-12-09 NOTE — Plan of Care (Signed)

## 2024-12-09 NOTE — Plan of Care (Signed)
" °  Problem: Coping: Goal: Ability to adjust to condition or change in health will improve Outcome: Progressing   Problem: Metabolic: Goal: Ability to maintain appropriate glucose levels will improve Outcome: Progressing   Problem: Clinical Measurements: Goal: Respiratory complications will improve Outcome: Progressing Goal: Cardiovascular complication will be avoided Outcome: Progressing   Problem: Activity: Goal: Risk for activity intolerance will decrease Outcome: Progressing   Problem: Coping: Goal: Level of anxiety will decrease Outcome: Progressing   Problem: Pain Managment: Goal: General experience of comfort will improve and/or be controlled Outcome: Progressing   Problem: Safety: Goal: Ability to remain free from injury will improve Outcome: Progressing   "

## 2024-12-09 NOTE — Progress Notes (Signed)
" °   12/09/24 1318  TOC Brief Assessment  Insurance and Status Reviewed  Patient has primary care physician Yes  Home environment has been reviewed Single family home  Prior level of function: Independent with ADL's  Prior/Current Home Services No current home services  Social Drivers of Health Review SDOH reviewed needs interventions;SDOH reviewed interventions complete  Readmission risk has been reviewed Yes  Transition of care needs no transition of care needs at this time    "

## 2024-12-09 NOTE — Progress Notes (Signed)
 " Progress Note   Patient: Diane [name] FMW:[id number] DOB: 1981-02-27 DOA: 12/07/2024     1 DOS: the patient was seen and examined on 12/09/2024    Brief hospital course: Diane [name] is a 44 y.o. female with medical history significant for vape use disorder, asthma, history of C. difficile infection, history of gastric ulcer, IBS, hyperlipidemia, GERD, obesity, who presents to the ER due to worsening shortness of breath and cough.  The patient quit tobacco use and switched to vaping.  Endorses low oxygen  level at home in the 70s and toes turning blue.  She presented to the ER for further evaluation and was found to have multifocal pneumonia.    Assessment and Plan:  Multifocal pneumonia in the setting of vaping use, POA Continue Rocephin  and doxycycline  Follow sputum culture and peripheral blood cultures x 2. DuoNebs every 6 hours, and every 2 hours as needed for shortness of breath and wheezing IV Decadron  10 mg daily x 3 days As needed antitussives Early mobilization Incentive spirometer.  Left ear pain Tympanic membranes appear normal on otoscopy. - Continue above antibiotics. - Will order IV toradol  x 4 doses.    Vaping use disorder Counseled on the importance of complete cessation of vaping use The patient was receptive and is planning on quitting.   History of C. difficile infection Started on prophylactic p.o. vancomycin  and will continue up to 7 days after completion of antibiotics for pneumonia Continue Florastor twice daily.   Hyperlipidemia Resume home regimen.   Prediabetes with hyperglycemia Last hemoglobin A1c 5.8 on 07/13/2023 Hyperglycemia likely exacerbated by IV steroids Current A1c is 6.1 - Continue insulin  coverage for hyperglycemia.    ADHD On Vyvanse  at home. Not on hospital formulary. - Will hold for now.  Depression/anxiety - Continue home as needed Xanax . -Continue home Effexor  - Hold home Veozah , as not on formulary.     Subjective:  Patient still feels very unwell.  She says she did not sleep well last night due to left ear pain.  She continues to cough a lot.  Physical Exam: BP 134/80 (BP Location: Left Arm)   Pulse 74   Temp 98.1 F (36.7 C)   Resp 20   LMP 08/26/2018   SpO2 100%     General: Alert, oriented X3  Eyes: Pupils equal, reactive  Ears: Normal tympanic membranes Oral cavity: moist mucous membranes  Head: Atraumatic, normocephalic  Neck: supple  Chest: Crackles right base CVS: S1,S2 RRR. No murmurs  Abd: No distention, soft, non-tender. No masses palpable  Extr: No edema   MSK: No joint deformities or swelling  Neurological: Grossly intact.    Data Reviewed:    Latest Ref Rng & Units 12/09/2024    6:38 AM 12/08/2024    3:00 AM 12/07/2024    7:44 PM  CBC  WBC 4.0 - 10.5 K/uL 4.1  3.0  3.4   Hemoglobin 12.0 - 15.0 g/dL 89.5  88.6  87.5   Hematocrit 36.0 - 46.0 % 33.7  36.0  37.2   Platelets 150 - 400 K/uL 194  169  183       Latest Ref Rng & Units 12/09/2024    6:38 AM 12/08/2024    3:00 AM 12/07/2024    7:44 PM  BMP  Glucose 70 - 99 mg/dL 861  816  89   BUN 6 - 20 mg/dL 9  12  11    Creatinine 0.44 - 1.00 mg/dL 9.38  9.25  9.12  Sodium 135 - 145 mmol/L 141  136  132   Potassium 3.5 - 5.1 mmol/L 3.5  3.7  4.4   Chloride 98 - 111 mmol/L 107  103  99   CO2 22 - 32 mmol/L 23  21  22    Calcium  8.9 - 10.3 mg/dL 9.4  8.8  9.1      Family Communication: n/a  Disposition: Status is: Observation DVT PPx: SQ lovenox       Author: MDALA-GAUSI, Junetta Hearn AGATHA, MD 12/09/2024 1:37 PM  For on call review [email or web address]    "

## 2024-12-09 NOTE — Plan of Care (Signed)
   Problem: Education: Goal: Ability to describe self-care measures that may prevent or decrease complications (Diabetes Survival Skills Education) will improve Outcome: Progressing Goal: Individualized Educational Video(s) Outcome: Progressing   Problem: Coping: Goal: Ability to adjust to condition or change in health will improve Outcome: Progressing

## 2024-12-09 NOTE — Discharge Instructions (Signed)
 FOOD PANTRY [employer] [facility] [address] [phone]  [facility] [employer] [facility] [address] [place] [phone]  [facility] - [facility] Distribution [facility] [address] [place] [phone]  [facility] [address] [phone]  [facility] - Food Distribution Center [address] [place], [place] [address] [phone]  UTILITIES [facility] [address] [place] [phone] Rental assistance/rental hotline: [phone] ext. [phone].    Utility assistance/utility hotline: [phone] ext. [address] Department of IT consultant (heating/cooling and water assistance) [phone] (rental and utility assistance) [phone]  [employer] - call 211

## 2024-12-10 ENCOUNTER — Observation Stay (HOSPITAL_COMMUNITY)

## 2024-12-10 DIAGNOSIS — J189 Pneumonia, unspecified organism: Secondary | ICD-10-CM | POA: Diagnosis not present

## 2024-12-10 DIAGNOSIS — H6592 Unspecified nonsuppurative otitis media, left ear: Secondary | ICD-10-CM

## 2024-12-10 LAB — CBC
HCT: 35 % — ABNORMAL LOW (ref 36.0–46.0)
Hemoglobin: 10.7 g/dL — ABNORMAL LOW (ref 12.0–15.0)
MCH: 26.8 pg (ref 26.0–34.0)
MCHC: 30.6 g/dL (ref 30.0–36.0)
MCV: 87.7 fL (ref 80.0–100.0)
Platelets: 141 10*3/uL — ABNORMAL LOW (ref 150–400)
RBC: 3.99 MIL/uL (ref 3.87–5.11)
RDW: 14.3 % (ref 11.5–15.5)
WBC: 5.1 10*3/uL (ref 4.0–10.5)
nRBC: 0 % (ref 0.0–0.2)

## 2024-12-10 LAB — GLUCOSE, CAPILLARY
Glucose-Capillary: 115 mg/dL — ABNORMAL HIGH (ref 70–99)
Glucose-Capillary: 106 mg/dL — ABNORMAL HIGH (ref 70–99)
Glucose-Capillary: 154 mg/dL — ABNORMAL HIGH (ref 70–99)

## 2024-12-10 MED ORDER — SACCHAROMYCES BOULARDII 250 MG PO CAPS: 250.0000 mg | ORAL_CAPSULE | Freq: Two times a day (BID) | ORAL | 0 refills | Status: AC

## 2024-12-10 MED ORDER — OXYCODONE HCL 5 MG PO TABS
15.0000 mg | ORAL_TABLET | ORAL | Status: DC | PRN
  Administered 2024-12-10: 15 mg via ORAL
  Filled 2024-12-10: qty 3

## 2024-12-10 MED ORDER — IOHEXOL 300 MG/ML  SOLN
100.0000 mL | Freq: Once | INTRAMUSCULAR | Status: AC | PRN
  Administered 2024-12-10: 100 mL via INTRAVENOUS

## 2024-12-10 MED ORDER — LUBIPROSTONE 24 MCG PO CAPS
24.0000 ug | ORAL_CAPSULE | Freq: Two times a day (BID) | ORAL | Status: DC
  Filled 2024-12-10: qty 1

## 2024-12-10 MED ORDER — HYDROMORPHONE HCL 1 MG/ML IJ SOLN: 0.5000 mg | Freq: Every evening | INTRAMUSCULAR | Status: DC | PRN

## 2024-12-10 MED ORDER — LIDOCAINE HCL 4 % EX SOLN
Freq: Three times a day (TID) | CUTANEOUS | Status: DC
  Filled 2024-12-10: qty 50

## 2024-12-10 MED ORDER — LIDOCAINE HCL 4 % EX SOLN: Freq: Three times a day (TID) | CUTANEOUS | 0 refills | Status: AC

## 2024-12-10 MED ORDER — CEFDINIR 300 MG PO CAPS: 300.0000 mg | ORAL_CAPSULE | Freq: Two times a day (BID) | ORAL | 0 refills | Status: AC

## 2024-12-10 MED ORDER — HYDROMORPHONE HCL 1 MG/ML IJ SOLN
0.5000 mg | Freq: Once | INTRAMUSCULAR | Status: AC
  Administered 2024-12-10: 0.5 mg via INTRAVENOUS
  Filled 2024-12-10: qty 0.5

## 2024-12-10 MED ORDER — OFLOXACIN 0.3 % OP SOLN: 5.0000 [drp] | Freq: Every day | OPHTHALMIC | 0 refills | Status: AC

## 2024-12-10 MED ORDER — DOXYCYCLINE HYCLATE 100 MG PO TABS: 100.0000 mg | ORAL_TABLET | Freq: Two times a day (BID) | ORAL | 0 refills | Status: AC

## 2024-12-10 MED ORDER — OXYCODONE HCL 15 MG PO TABS: 15.0000 mg | ORAL_TABLET | ORAL | 0 refills | Status: AC | PRN

## 2024-12-10 MED ORDER — VANCOMYCIN HCL 125 MG PO CAPS: 125.0000 mg | ORAL_CAPSULE | Freq: Every day | ORAL | 0 refills | Status: AC

## 2024-12-10 NOTE — Plan of Care (Signed)

## 2024-12-10 NOTE — Discharge Summary (Signed)
 " Physician Discharge Summary   Patient: Diane [name] MRN: [id number] DOB: [birth year]  Admit date:     12/07/2024  Discharge date: {dischdate:26783}  Discharge Physician: MDALA-GAUSI, GOLDEN PILLOW   PCP: Jesus Bernardino MATSU, MD   Recommendations at discharge:  {Tip this will not be part of the note when signed- Example include specific recommendations for outpatient follow-up, pending tests to follow-up on. (Optional):26781}  ***  Discharge Diagnoses: Principal Problem:   Left otitis media with effusion Active Problems:   CAP (community acquired pneumonia)  Resolved Problems:   * No resolved hospital problems. [facility] Course: No notes on file  Assessment and Plan: No notes have been filed under this hospital service. Service: Hospitalist     {Tip this will not be part of the note when signed Body mass index is 35.25 kg/m. , ,  (Optional):26781}  {(NOTE) Pain control PDMP Statment (Optional):26782} Consultants: *** Procedures performed: ***  Disposition: {Plan; Disposition:26390} Diet recommendation:  Discharge Diet Orders (From admission, onward)     Start     Ordered   12/10/24 0000  Diet general        12/10/24 1616           {Diet_Plan:26776} DISCHARGE MEDICATION: Allergies as of 12/10/2024       Reactions   Droperidol  Other (See Comments)   Jittery and restlessness   Metoclopramide  Other (See Comments)   Reglan    Nickel Rash   any jewelry that's not real/ rash and itching   Prochlorperazine Other (See Comments)   Nsaids Other (See Comments)   DUE TO HISTORY OF GASTRIC ULCER   Tolmetin Itching, Other (See Comments)   DUE TO HISTORY OF GASTRIC ULCER  DUE TO HISTORY OF GASTRIC ULCER DUE TO HISTORY OF GASTRIC ULCER, DUE TO HISTORY OF GASTRIC ULCER   Oxybutynin  Other (See Comments)   severe dry mouth   Adhesive [tape] Rash   Metoclopramide  Hcl Other (See Comments)   RESTLESSNESS/JITTERY   Prochlorperazine Edisylate Other (See Comments)    RESTLESSNESS/JITTERY   Sulfa Antibiotics Rash        Medication List     STOP taking these medications    diclofenac  Sodium 1 % Gel Commonly known as: Voltaren    fluconazole  150 MG tablet Commonly known as: DIFLUCAN    magnesium  oxide 400 MG tablet Commonly known as: MAG-OX   oseltamivir 75 MG capsule Commonly known as: TAMIFLU   promethazine -dextromethorphan 6.25-15 MG/5ML syrup Commonly known as: PROMETHAZINE -DM   triamcinolone  cream 0.1 % Commonly known as: KENALOG        TAKE these medications    albuterol  108 (90 Base) MCG/ACT inhaler Commonly known as: VENTOLIN  HFA Inhale 2 puffs into the lungs every 6 (six) hours as needed for wheezing or shortness of breath.   ALPRAZolam  0.25 MG tablet Commonly known as: XANAX  Take 1 tablet (0.25 mg total) by mouth daily as needed. Use as little as possible and not after 3pm   atorvastatin  40 MG tablet Commonly known as: LIPITOR TAKE 1 TABLET BY MOUTH EVERY DAY   cefdinir  300 MG capsule Commonly known as: OMNICEF  Take 1 capsule (300 mg total) by mouth 2 (two) times daily for 4 days.   cetirizine 10 MG tablet Commonly known as: ZYRTEC Take 10 mg by mouth daily as needed for allergies.   cholestyramine  4 g packet Commonly known as: Questran  Take 1 packet (4 g total) by mouth 3 (three) times daily with meals.   Deep Sea Nasal Spray 0.65 % nasal  spray Generic drug: sodium chloride  Use nightly for sinus hygiene long-term. Can also be used as many times daily as desired to assist with cleaning congested sinuses.   doxycycline  100 MG tablet Commonly known as: VIBRA -TABS Take 1 tablet (100 mg total) by mouth every 12 (twelve) hours for 4 days.   Emgality  120 MG/ML Soaj Generic drug: Galcanezumab -gnlm Inject 120 mg into the skin every 30 (thirty) days.   EQL [facility] Tabs   fluticasone  50 MCG/ACT nasal spray Commonly known as: FLONASE  Place 2 sprays into both nostrils daily.   fluticasone  50 MCG/ACT nasal  spray Commonly known as: FLONASE  Place 2 sprays into both nostrils daily.   gabapentin  300 MG capsule Commonly known as: NEURONTIN  Take 2 capsules (600 mg total) by mouth 3 (three) times daily. What changed:  how much to take when to take this additional instructions Another medication with the same name was removed. Continue taking this medication, and follow the directions you see here.   ipratropium-albuterol  0.5-2.5 (3) MG/3ML Soln Commonly known as: DUONEB Take 3 mLs by nebulization every 4 (four) hours as needed.   levocetirizine 5 MG tablet Commonly known as: XYZAL  Take 1 tablet (5 mg total) by mouth every evening.   lidocaine  4 % external solution Commonly known as: XYLOCAINE  Apply topically 3 (three) times daily for 5 days.   lisdexamfetamine  30 MG capsule Commonly known as: Vyvanse  Take 1 capsule (30 mg total) by mouth daily.   lubiprostone  24 MCG capsule Commonly known as: AMITIZA  TAKE ONE CAPSULE (24 MCG DOSE) BY MOUTH 2 (TWO) TIMES DAILY WITH MEALS.   meclizine  25 MG tablet Commonly known as: ANTIVERT  Take 1 tablet (25 mg total) by mouth 3 (three) times daily as needed for dizziness.   Nebulizer/Tubing/Mouthpiece Kit 1 each by Does not apply route every 4 (four) hours as needed.   Nucynta  100 MG Tabs Generic drug: Tapentadol  HCl Take 1 tablet (100 mg total) by mouth every 8 (eight) hours as needed. What changed: when to take this   ofloxacin  0.3 % ophthalmic solution Commonly known as: OCUFLOX  Place 5 drops into the left ear daily for 5 days. Start taking on: December 11, 2024   omeprazole 40 MG capsule Commonly known as: PRILOSEC TAKE 1 (ONE) CAPSUL CAPSULE BY MOUTH DAILY   ondansetron  8 MG disintegrating tablet Commonly known as: ZOFRAN -ODT Take 1 tablet (8 mg total) by mouth every 8 (eight) hours as needed.   oxyCODONE  15 MG immediate release tablet Commonly known as: ROXICODONE  Take 1 tablet (15 mg total) by mouth every 4 (four) hours as  needed for severe pain (pain score 7-10) or moderate pain (pain score 4-6). PLEASE DO NOT TAKE YOUR NUCYNTA  WHILE YOU ARE TAKING THIS MEDICATION.   potassium chloride  SA 20 MEQ tablet Commonly known as: KLOR-CON  M Take 1 tablet (20 mEq total) by mouth daily.   Probiotic 1-250 BILLION-MG Caps   progesterone  100 MG capsule Commonly known as: Prometrium  Take 3 capsules (300 mg total) by mouth daily.   promethazine  25 MG tablet Commonly known as: PHENERGAN  Take 1 tablet (25 mg total) by mouth every 8 (eight) hours as needed for nausea or vomiting.   saccharomyces boulardii 250 MG capsule Commonly known as: FLORASTOR Take 1 capsule (250 mg total) by mouth 2 (two) times daily.   senna-docusate 8.6-50 MG tablet Commonly known as: Senokot-S Take 1 tablet by mouth daily.   tiZANidine  4 MG tablet Commonly known as: ZANAFLEX  Take 4 mg by mouth every 6 (six)  hours as needed for muscle spasms.   traZODone 50 MG tablet Commonly known as: DESYREL Take 1-2 tablets by mouth at bedtime as needed.   vancomycin  125 MG capsule Commonly known as: VANCOCIN  Take 1 capsule (125 mg total) by mouth daily for 11 days.   venlafaxine  XR 150 MG 24 hr capsule Commonly known as: EFFEXOR -XR TAKE 2 CAPS BY MOUTH DAILY   Veozah  45 MG Tabs Generic drug: Fezolinetant  Take 1 tablet (45 mg total) by mouth daily at 6 (six) AM.   zolmitriptan  5 MG tablet Commonly known as: ZOMIG  TAKE 1 TABLET BY MOUTH AS NEEDED FOR MIGRAINE.   zonisamide  100 MG capsule Commonly known as: ZONEGRAN  Take 150 mg by mouth at bedtime. What changed: Another medication with the same name was changed. Make sure you understand how and when to take each.   zonisamide  25 MG capsule Commonly known as: ZONEGRAN  Take 1 capsule (25 mg total) by mouth in the morning, at noon, and at bedtime. What changed:  how much to take when to take this additional instructions        Discharge Exam: Filed Weights   12/09/24 2045   Weight: 102.1 kg   ***  Condition at discharge: {DC Condition:26389}  The results of significant diagnostics from this hospitalization (including imaging, microbiology, ancillary and laboratory) are listed below for reference.   Imaging Studies: CT TEMPORAL BONES W CONTRAST Result Date: 12/10/2024 EXAM: CT TEMPORAL BONES WITH CONTRAST 12/10/2024 11:31:22 AM TECHNIQUE: CT of the temporal bones was performed with the administration of intravenous contrast. Multiplanar reformatted images are provided for review. Automated exposure control, iterative reconstruction, and/or weight based adjustment of the mA/kV was utilized to reduce the radiation dose to as low as reasonably achievable. CONTRAST: 100 mL of Omnipaque  350. COMPARISON: MRI brain 08/22/2024. CTA head and neck 07/29/2024. CLINICAL HISTORY: 44 year old female with left ear and mastoid pain. FINDINGS: RIGHT TEMPORAL BONE: EXTERNAL AUDITORY CANAL: Clear. No bony erosion. Scutum is intact. MIDDLE EAR CAVITY: Right tympanic membrane is intact. Ossicular chain is intact. MASTOID AIR CELLS: Clear. INNER EAR: The cochlea and vestibule are unremarkable. Normal mineralization of the otic capsule. Thinning of bone or dehiscence over the right superior semicircular canal (series 11 image 184). Otherwise, right semicircular canals are negative. The vestibular aqueduct is not dilated. INTERNAL AUDITORY CANAL: Unremarkable. Normal bony canal of the facial nerve. Normal course of the right 7th nerve and stylomastoid foramen. LEFT TEMPORAL BONE: EXTERNAL AUDITORY CANAL: Clear. No bony erosion. Scutum is intact. MIDDLE EAR CAVITY: Left tympanic membrane is intact. Ossicular chain is intact. MASTOID AIR CELLS: Clear. INNER EAR: The cochlea and vestibule are unremarkable. Normal mineralization of the otic capsule. Thinning of bone over the left superior semicircular canal (series 11 image 185). Otherwise, left semicircular canals are negative. The vestibular aqueduct  is not dilated. INTERNAL AUDITORY CANAL: Unremarkable. Normal bony canal of the facial nerve. Normal course of the left 7th nerve and stylomastoid foramen. VASCULAR: Normal jugular bulbs. Normal carotid canals. Major vascular structures at the visible skull base are enhancing and patent. BRAIN: Negative visible brain parenchyma. ORBITS: Negative visible orbit soft tissues. SINUSES: Scattered mild new paranasal sinus mucosal thickening with bubbly opacity. Trace layering sinus fluid. Small volume retained secretions in the nasal cavity, also nasopharynx. SOFT TISSUES: Negative visible deep soft tissue spaces of the face. IMPRESSION: 1. Tympanic cavities and mastoids are clear, negative temporal bones (see #2). 2. Incidental thinning of bone or dehiscence of the superior semicircular canals, worse on the  right. 3. Evidence of acute Rhinosinusitis. Electronically signed by: Helayne Hurst MD 12/10/2024 11:45 AM EST RP Workstation: [id number]   DG Chest 2 View Result Date: 12/07/2024 CLINICAL DATA:  Cough with fever and flu like symptoms 4 days. EXAM: CHEST - 2 VIEW COMPARISON:  02/29/2024 FINDINGS: Lungs are adequately inflated with multifocal airspace process predominately over the right lung and possibly involving the left base/retrocardiac region. This airspace process worse over the right upper lobe. These findings likely due to multifocal pneumonia. No effusion. Cardiomediastinal silhouette and remainder of the exam is unchanged. IMPRESSION: Multifocal airspace process likely due to multifocal pneumonia. Electronically Signed   By: Toribio Agreste M.D.   On: 12/07/2024 16:51   MM 3D SCREENING MAMMOGRAM BILATERAL BREAST Result Date: 11/25/2024 CLINICAL DATA:  Screening. EXAM: DIGITAL SCREENING BILATERAL MAMMOGRAM WITH TOMOSYNTHESIS AND CAD TECHNIQUE: Bilateral screening digital craniocaudal and mediolateral oblique mammograms were obtained. Bilateral screening digital breast tomosynthesis was performed. The images  were evaluated with computer-aided detection. COMPARISON:  Previous exam(s). ACR Breast Density Category c: The breasts are heterogeneously dense, which may obscure small masses. FINDINGS: There are no findings suspicious for malignancy. IMPRESSION: No mammographic evidence of malignancy. A result letter of this screening mammogram will be mailed directly to the patient. RECOMMENDATION: Screening mammogram in one year. (Code:SM-B-01Y) BI-RADS CATEGORY  1: Negative. Electronically Signed   By: Alm Parkins M.D.   On: 11/25/2024 08:10    Microbiology: Results for orders placed or performed during the hospital encounter of 12/07/24  Resp panel by RT-PCR (RSV, Flu A&B, Covid) Anterior Nasal Swab     Status: None   Collection Time: 12/07/24  5:32 PM   Specimen: Anterior Nasal Swab  Result Value Ref Range Status   SARS Coronavirus 2 by RT PCR NEGATIVE NEGATIVE Final    Comment: (NOTE) SARS-CoV-2 target nucleic acids are NOT DETECTED.  The SARS-CoV-2 RNA is generally detectable in upper respiratory specimens during the acute phase of infection. The lowest concentration of SARS-CoV-2 viral copies this assay can detect is 138 copies/mL. A negative result does not preclude SARS-Cov-2 infection and should not be used as the sole basis for treatment or other patient management decisions. A negative result may occur with  improper specimen collection/handling, submission of specimen other than nasopharyngeal swab, presence of viral mutation(s) within the areas targeted by this assay, and inadequate number of viral copies(<138 copies/mL). A negative result must be combined with clinical observations, patient history, and epidemiological information. The expected result is Negative.  Fact Sheet for Patients:  bloggercourse.com  Fact Sheet for Healthcare Providers:  seriousbroker.it  This test is no t yet approved or cleared by the [place]  FDA  and  has been authorized for detection and/or diagnosis of SARS-CoV-2 by FDA under an Emergency Use Authorization (EUA). This EUA will remain  in effect (meaning this test can be used) for the duration of the COVID-19 declaration under Section 564(b)(1) of the Act, 21 U.S.C.section 360bbb-3(b)(1), unless the authorization is terminated  or revoked sooner.       Influenza A by PCR NEGATIVE NEGATIVE Final   Influenza B by PCR NEGATIVE NEGATIVE Final    Comment: (NOTE) The Xpert Xpress SARS-CoV-2/FLU/RSV plus assay is intended as an aid in the diagnosis of influenza from Nasopharyngeal swab specimens and should not be used as a sole basis for treatment. Nasal washings and aspirates are unacceptable for Xpert Xpress SARS-CoV-2/FLU/RSV testing.  Fact Sheet for Patients: bloggercourse.com  Fact Sheet for Healthcare Providers: seriousbroker.it  This  test is not yet approved or cleared by the [place]  FDA and has been authorized for detection and/or diagnosis of SARS-CoV-2 by FDA under an Emergency Use Authorization (EUA). This EUA will remain in effect (meaning this test can be used) for the duration of the COVID-19 declaration under Section 564(b)(1) of the Act, 21 U.S.C. section 360bbb-3(b)(1), unless the authorization is terminated or revoked.     Resp Syncytial Virus by PCR NEGATIVE NEGATIVE Final    Comment: (NOTE) Fact Sheet for Patients: bloggercourse.com  Fact Sheet for Healthcare Providers: seriousbroker.it  This test is not yet approved or cleared by the [place]  FDA and has been authorized for detection and/or diagnosis of SARS-CoV-2 by FDA under an Emergency Use Authorization (EUA). This EUA will remain in effect (meaning this test can be used) for the duration of the COVID-19 declaration under Section 564(b)(1) of the Act, 21 U.S.C. section 360bbb-3(b)(1),  unless the authorization is terminated or revoked.  Performed at [facility], [address], [place], [place] [address]   Culture, blood (Routine X 2) w Reflex to ID Panel     Status: None (Preliminary result)   Collection Time: 12/08/24  2:29 AM   Specimen: BLOOD  Result Value Ref Range Status   Specimen Description   Final    BLOOD SITE NOT SPECIFIED Performed at [facility], [address] [place], [place] [address]    Special Requests   Final    BOTTLES DRAWN AEROBIC AND ANAEROBIC Blood Culture results may not be optimal due to an inadequate volume of blood received in culture bottles Performed at [facility], [address] [place], [place] [address]    Culture   Final    NO GROWTH 2 DAYS Performed at [facility], [address], [place], [place] [address]    Report Status PENDING  Incomplete  Culture, blood (Routine X 2) w Reflex to ID Panel     Status: None (Preliminary result)   Collection Time: 12/08/24  3:00 AM   Specimen: BLOOD  Result Value Ref Range Status   Specimen Description   Final    BLOOD RIGHT ANTECUBITAL Performed at [facility], [address] [place], [place] [address]    Special Requests   Final    BOTTLES DRAWN AEROBIC AND ANAEROBIC Blood Culture results may not be optimal due to an inadequate volume of blood received in culture bottles Performed at [facility], [address] [place], [place] [address]    Culture   Final    NO GROWTH 2 DAYS Performed at [facility], [address], [place], [place] [address]    Report Status PENDING  Incomplete   *Note: Due to a large number of results and/or encounters for the requested time period, some results have not been displayed. A complete set of results can be found in Results Review.    Labs: CBC: Recent Labs  Lab 12/07/24 1944 12/08/24 0300 12/09/24 0638 12/10/24 0538  WBC  3.4* 3.0* 4.1 5.1  NEUTROABS 1.7 2.4  --   --   HGB 12.4 11.3* 10.4* 10.7*  HCT 37.2 36.0 33.7* 35.0*  MCV 83.8 86.5 87.1 87.7  PLT 183 169 194 141*   Basic Metabolic Panel: Recent Labs  Lab 12/07/24 1944 12/08/24 0300 12/09/24 0638  NA 132* 136 141  K 4.4 3.7 3.5  CL 99 103 107  CO2 22 21* 23  GLUCOSE 89  183* 138*  BUN 11 12 9   CREATININE 0.87 0.74 0.61  CALCIUM  9.1 8.8* 9.4  MG  --  2.0  --   PHOS  --  2.2* 4.7*   Liver Function Tests: Recent Labs  Lab 12/07/24 1944  AST 28  ALT 13  ALKPHOS 139*  BILITOT 0.3  PROT 7.3  ALBUMIN 3.8   CBG: Recent Labs  Lab 12/09/24 1133 12/09/24 1623 12/09/24 2046 12/10/24 0743 12/10/24 1155  GLUCAP 125* 147* 169* 115* 106*    Discharge time spent: {LESS THAN/GREATER THAN:26388} 30 minutes.  Signed: MDALA-GAUSI, Kyrus Hyde AGATHA, MD [facility] 12/10/2024 "

## 2024-12-10 NOTE — Progress Notes (Signed)
 " Progress Note   Patient: Diane [name] FMW:[id number] DOB: [birth year] DOA: 12/07/2024     1 DOS: the patient was seen and examined on 12/10/2024    Brief hospital course: Diane [name] is a 44 y.o. female with medical history significant for vape use disorder, asthma, history of C. difficile infection, history of gastric ulcer, IBS, hyperlipidemia, GERD, obesity, who presents to the ER due to worsening shortness of breath and cough.  The patient quit tobacco use and switched to vaping.  Endorses low oxygen  level at home in the 70s and toes turning blue.  She presented to the ER for further evaluation and was found to have multifocal pneumonia.    Assessment and Plan:  Multifocal pneumonia in the setting of vaping use, POA Continue Rocephin  and doxycycline  for total of 7 days with end date 1/27.  Follow sputum culture and peripheral blood cultures x 2. DuoNebs every 6 hours, and every 2 hours as needed for shortness of breath and wheezing IV Decadron  10 mg daily x 3 days As needed antitussives Early mobilization Incentive spirometer.  Left otitis media Effusion noted in left ear. Patient complains of severe pain. Discussed with ENT.  Recommended CT of the temporal bones to rule out complication (osteomyelitis). CT completed and with no concern for osteomyelitis.  Noted rhinosinusitis. - Continue current antibiotics (ceftriaxone , doxycycline ). - As needed oxycodone  for pain (of note, patient unable to take NSAIDs). - Ofloxacin  eardrops daily -Lidocaine  solution to be instilled in ear 3 times daily  Vaping use disorder Counseled on the importance of complete cessation of vaping use The patient was receptive and is planning on quitting.   History of C. difficile infection Started on prophylactic p.o. vancomycin  and will continue up to 7 days after completion of antibiotics for pneumonia Continue Florastor twice daily.   Hyperlipidemia Resume home regimen.   Prediabetes with  hyperglycemia Last hemoglobin A1c 5.8 on 07/13/2023 Hyperglycemia likely exacerbated by IV steroids Current A1c is 6.1 - Continue insulin  coverage for hyperglycemia.    ADHD On Vyvanse  at home. Not on hospital formulary. - Will hold for now.  Depression/anxiety - Continue home as needed Xanax . -Continue home Effexor  - Hold home Veozah , as not on formulary.     Subjective: Patient still feels very unwell.  She says she did not sleep well last night due to left ear pain.  She continues to cough a lot.  Physical Exam: BP 139/78 (BP Location: Left Arm)   Pulse 67   Temp 97.8 F (36.6 C) (Oral)   Resp 16   Ht 5' 7 (1.702 m)   Wt 102.1 kg   LMP 08/26/2018   SpO2 98%   BMI 35.25 kg/m     General: Alert, oriented X3  Eyes: Pupils equal, reactive  Ears: Normal tympanic membranes Oral cavity: moist mucous membranes  Head: Atraumatic, normocephalic  Neck: supple  Chest: Crackles right base CVS: S1,S2 RRR. No murmurs  Abd: No distention, soft, non-tender. No masses palpable  Extr: No edema   MSK: No joint deformities or swelling  Neurological: Grossly intact.    Data Reviewed:    Latest Ref Rng & Units 12/10/2024    5:38 AM 12/09/2024    6:38 AM 12/08/2024    3:00 AM  CBC  WBC 4.0 - 10.5 K/uL 5.1  4.1  3.0   Hemoglobin 12.0 - 15.0 g/dL 89.2  89.5  88.6   Hematocrit 36.0 - 46.0 % 35.0  33.7  36.0   Platelets  150 - 400 K/uL 141  194  169       Latest Ref Rng & Units 12/09/2024    6:38 AM 12/08/2024    3:00 AM 12/07/2024    7:44 PM  BMP  Glucose 70 - 99 mg/dL 861  816  89   BUN 6 - 20 mg/dL 9  12  11    Creatinine 0.44 - 1.00 mg/dL 9.38  9.25  9.12   Sodium 135 - 145 mmol/L 141  136  132   Potassium 3.5 - 5.1 mmol/L 3.5  3.7  4.4   Chloride 98 - 111 mmol/L 107  103  99   CO2 22 - 32 mmol/L 23  21  22    Calcium  8.9 - 10.3 mg/dL 9.4  8.8  9.1      Family Communication: n/a  Disposition: Status is: Observation DVT PPx: SQ  lovenox       Author: MDALA-GAUSI, Amauria Younts AGATHA, MD 12/10/2024 3:07 PM  For on call review [email or web address].    "

## 2024-12-11 ENCOUNTER — Ambulatory Visit: Payer: Self-pay | Admitting: Internal Medicine

## 2024-12-11 NOTE — Progress Notes (Signed)
 Negative blood cultures

## 2024-12-12 ENCOUNTER — Telehealth: Payer: Self-pay

## 2024-12-12 NOTE — Telephone Encounter (Signed)
 Tried to call pt per provider request to make appt for hosptial follow up no answer and not able to leave message.

## 2024-12-12 NOTE — Transitions of Care (Post Inpatient/ED Visit) (Signed)
 "  12/12/2024  Name: Diane [name] MRN: [id number] DOB: [birth year]  Today's TOC FU Call Status: Today's TOC FU Call Status:: Successful TOC FU Call Completed TOC FU Call Complete Date: 12/12/24  Patient's Name and Date of Birth confirmed. Name, DOB  Transition Care Management Follow-up Telephone Call Date of Discharge: 12/10/24 Discharge Facility: [facility]) Type of Discharge: Inpatient Admission Primary Inpatient Discharge Diagnosis:: pneumonia How have you been since you were released from the hospital?: Better Any questions or concerns?: No  Items Reviewed: Did you receive and understand the discharge instructions provided?: Yes Medications obtained,verified, and reconciled?: Yes (Medications Reviewed) Any new allergies since your discharge?: No Dietary orders reviewed?: Yes Do you have support at home?: Yes People in Home [RPT]: spouse  Medications Reviewed Today: Medications Reviewed Today     Reviewed by Emmitt Pan, LPN (Licensed Practical Nurse) on 12/12/24 at 1623  Med List Status: <None>   Medication Order Taking? Sig Documenting Provider Last Dose Status Informant  albuterol  (PROVENTIL  HFA;VENTOLIN  HFA) 108 (90 Base) MCG/ACT inhaler [id number]  Inhale 2 puffs into the lungs every 6 (six) hours as needed for wheezing or shortness of breath.  [provider]  Active Self, Pharmacy Records           Med Note VIRGIA, LAYMON JINNY [place] Dec 01, 2018 11:03 PM)    ALPRAZolam  (XANAX ) 0.25 MG tablet [id number]  Take 1 tablet (0.25 mg total) by mouth daily as needed. Use as little as possible and not after 3pm   Active Self, Pharmacy Records  atorvastatin  (LIPITOR) 40 MG tablet [id number]  TAKE 1 TABLET BY MOUTH EVERY DAY Jesus Bernardino MATSU, MD  Active   Bacillus Coagulans-Inulin (PROBIOTIC) 1-250 BILLION-MG CAPS [id number]   [provider]  Active Self, Pharmacy Records  cefdinir  (OMNICEF ) 300 MG capsule [id number]  Take 1 capsule (300 mg total) by  mouth 2 (two) times daily for 4 days. Mdala-Gausi, Masiku Agatha, MD  Active   cetirizine (ZYRTEC) 10 MG tablet [id number]  Take 10 mg by mouth daily as needed for allergies. [provider]  Active Self, Pharmacy Records  cholestyramine  (QUESTRAN ) 4 g packet [id number]  Take 1 packet (4 g total) by mouth 3 (three) times daily with meals.  Patient not taking: Reported on 12/12/2024   Jesus Bernardino MATSU, MD  Active Self, Pharmacy Records  doxycycline  (VIBRA -TABS) 100 MG tablet [id number]  Take 1 tablet (100 mg total) by mouth every 12 (twelve) hours for 4 days. Mdala-Gausi, Masiku Agatha, MD  Active   EMGALITY  120 MG/ML SOAJ [id number]  Inject 120 mg into the skin every 30 (thirty) days. Jesus Bernardino MATSU, MD  Active Self, Pharmacy Records  Erenumab -aooe SOAJ 140 mg [id number]   Jesus Bernardino MATSU, MD  Active   Fezolinetant  (VEOZAH ) 45 MG TABS [id number]  Take 1 tablet (45 mg total) by mouth daily at 6 (six) AM. Jesus Bernardino MATSU, MD  Active Self, Pharmacy Records  fluticasone  (FLONASE ) 50 MCG/ACT nasal spray [id number]  Place 2 sprays into both nostrils daily.  Patient not taking: Reported on 12/12/2024   Jesus Bernardino MATSU, MD  Active Self, Pharmacy Records  fluticasone  (FLONASE ) 50 MCG/ACT nasal spray [id number]  Place 2 sprays into both nostrils daily. Jesus Bernardino MATSU, MD  Active Self, Pharmacy Records  gabapentin  (NEURONTIN ) 300 MG capsule [id number] Yes Take 2 capsules (600 mg total) by mouth 3 (three) times daily.  Patient taking differently: Take 600-1,200 mg by mouth 2 (two) times daily. Patient takes  600mg  by mouth in the Morning and 1200mg  at night   Jesus Bernardino MATSU, MD  Active Self, Pharmacy Records  ipratropium-albuterol  (DUONEB) 0.5-2.5 (3) MG/3ML SOLN [id number]  Take 3 mLs by nebulization every 4 (four) hours as needed. Jesus Bernardino MATSU, MD  Active Self, Pharmacy Records  levocetirizine (XYZAL ) 5 MG tablet [id number]  Take 1 tablet (5 mg total) by mouth every evening. Jesus Bernardino MATSU, MD   Active Self, Pharmacy Records  lidocaine  (XYLOCAINE ) 4 % external solution [id number]  Apply topically 3 (three) times daily for 5 days. Mdala-Gausi, Masiku Agatha, MD  Active   lisdexamfetamine  (VYVANSE ) 30 MG capsule [id number]  Take 1 capsule (30 mg total) by mouth daily.   Active Self, Pharmacy Records  lubiprostone  (AMITIZA ) 24 MCG capsule [id number]  TAKE ONE CAPSULE (24 MCG DOSE) BY MOUTH 2 (TWO) TIMES DAILY WITH MEALS. Jesus Bernardino MATSU, MD  Active Self, Pharmacy Records  meclizine  (ANTIVERT ) 25 MG tablet [id number]  Take 1 tablet (25 mg total) by mouth 3 (three) times daily as needed for dizziness. [name], PA-C  Active Self, Pharmacy Records  Multiple Vitamins-Minerals [facility]) [place]ILL[place] [id number]   [provider]  Active Self, Pharmacy Records  ofloxacin  (OCUFLOX ) 0.3 % ophthalmic solution [id number]  Place 5 drops into the left ear daily for 5 days. Mdala-Gausi, Masiku Agatha, MD  Active   omeprazole (PRILOSEC) 40 MG capsule [id number]  TAKE 1 (ONE) CAPSUL CAPSULE BY MOUTH DAILY Jesus Bernardino MATSU, MD  Active Self, Pharmacy Records  ondansetron  (ZOFRAN -ODT) 8 MG disintegrating tablet [id number]  Take 1 tablet (8 mg total) by mouth every 8 (eight) hours as needed. Jesus Bernardino MATSU, MD  Active Self, Pharmacy Records  oxyCODONE  (ROXICODONE ) 15 MG immediate release tablet [id number]  Take 1 tablet (15 mg total) by mouth every 4 (four) hours as needed for severe pain (pain score 7-10) or moderate pain (pain score 4-6). PLEASE DO NOT TAKE YOUR NUCYNTA  WHILE YOU ARE TAKING THIS MEDICATION. Mdala-Gausi, Masiku Agatha, MD  Active   potassium chloride  SA (KLOR-CON  M) 20 MEQ tablet [id number]  Take 1 tablet (20 mEq total) by mouth daily. Jesus Bernardino MATSU, MD  Active Self, Pharmacy Records  progesterone  (PROMETRIUM ) 100 MG capsule [id number]  Take 3 capsules (300 mg total) by mouth daily. Jesus Bernardino MATSU, MD  Active Self, Pharmacy Records  promethazine  (PHENERGAN ) 25 MG tablet [id number]   Take 1 tablet (25 mg total) by mouth every 8 (eight) hours as needed for nausea or vomiting. Lenor Hollering, MD  Active Self, Pharmacy Records  Respiratory Therapy Supplies (NEBULIZER/TUBING/MOUTHPIECE) KIT [id number]  1 each by Does not apply route every 4 (four) hours as needed. Jesus Bernardino MATSU, MD  Active Self, Pharmacy Records  saccharomyces boulardii [facility]) 250 MG capsule [id number]  Take 1 capsule (250 mg total) by mouth 2 (two) times daily. Mdala-Gausi, Golden Pillow, MD  Active   senna-docusate (SENOKOT-S) 8.6-50 MG tablet [id number]  Take 1 tablet by mouth daily. Prosperi, Christian H, PA-C  Active Self, Pharmacy Records  sodium chloride  (SALINE MIST) 0.65 % nasal spray [id number]  Use nightly for sinus hygiene long-term. Can also be used as many times daily as desired to assist with cleaning congested sinuses. Jesus Bernardino MATSU, MD  Active Self, Pharmacy Records  Tapentadol  HCl (NUCYNTA ) 100 MG TABS [id number]  Take 1 tablet (100 mg total) by mouth every 8 (eight) hours as needed.  Patient not taking: Reported on 12/12/2024     Active Self, Pharmacy Records  tiZANidine  (ZANAFLEX )  4 MG tablet [id number]  Take 4 mg by mouth every 6 (six) hours as needed for muscle spasms.  [provider]  Active Self, Pharmacy Records           Med Note SOILA LYLE JAYSON [place] Aug 12, 2018 12:51 PM)    traZODone (DESYREL) 50 MG tablet [id number]  Take 1-2 tablets by mouth at bedtime as needed. [provider]  Active Self, Pharmacy Records  vancomycin  (VANCOCIN ) 125 MG capsule [id number]  Take 1 capsule (125 mg total) by mouth daily for 11 days. Mdala-Gausi, Masiku Agatha, MD  Active   venlafaxine  XR (EFFEXOR -XR) 150 MG 24 hr capsule [id number]  TAKE 2 CAPS BY MOUTH DAILY Jesus Bernardino MATSU, MD  Active Self, Pharmacy Records  zolmitriptan  (ZOMIG ) 5 MG tablet [id number]  TAKE 1 TABLET BY MOUTH AS NEEDED FOR MIGRAINE. Gregg Lek, MD  Active Self, Pharmacy Records  zonisamide  (ZONEGRAN ) 100 MG  capsule [id number]  Take 150 mg by mouth at bedtime. [provider]  Active Self, Pharmacy Records  zonisamide  (ZONEGRAN ) 25 MG capsule [id number] Yes Take 1 capsule (25 mg total) by mouth in the morning, at noon, and at bedtime.  Patient taking differently: Take 50 mg by mouth daily. Patient takes this medication along with the 100 mg capsule of zonisamide  for a total of 150mg    Rosslyn Dino HERO, MD  Active Self, Pharmacy Records            Home Care and Equipment/Supplies: Were Home Health Services Ordered?: NA Any new equipment or medical supplies ordered?: NA  Functional Questionnaire: Do you need assistance with bathing/showering or dressing?: No Do you need assistance with meal preparation?: No Do you need assistance with eating?: No Do you have difficulty maintaining continence: No Do you need assistance with getting out of bed/getting out of a chair/moving?: No Do you have difficulty managing or taking your medications?: No  Follow up appointments reviewed: PCP Follow-up appointment confirmed?: Yes Date of PCP follow-up appointment?: 12/20/24 Follow-up Provider: [facility] Follow-up appointment confirmed?: NA Do you need transportation to your follow-up appointment?: No Do you understand care options if your condition(s) worsen?: Yes-patient verbalized understanding    SIGNATURE Julian Lemmings, LPN [facility] Nurse Health Advisor Direct Dial [phone]  "

## 2024-12-13 ENCOUNTER — Telehealth: Admitting: Internal Medicine

## 2024-12-13 ENCOUNTER — Encounter: Payer: Self-pay | Admitting: Internal Medicine

## 2024-12-13 VITALS — Ht 67.0 in | Wt 222.0 lb

## 2024-12-13 DIAGNOSIS — K2100 Gastro-esophageal reflux disease with esophagitis, without bleeding: Secondary | ICD-10-CM

## 2024-12-13 DIAGNOSIS — A0472 Enterocolitis due to Clostridium difficile, not specified as recurrent: Secondary | ICD-10-CM | POA: Diagnosis not present

## 2024-12-13 DIAGNOSIS — R1319 Other dysphagia: Secondary | ICD-10-CM

## 2024-12-13 DIAGNOSIS — Z23 Encounter for immunization: Secondary | ICD-10-CM

## 2024-12-13 DIAGNOSIS — J189 Pneumonia, unspecified organism: Secondary | ICD-10-CM | POA: Diagnosis not present

## 2024-12-13 LAB — CULTURE, BLOOD (ROUTINE X 2)
Culture: NO GROWTH
Culture: NO GROWTH

## 2024-12-13 MED ORDER — PEPCID COMPLETE 10-800-165 MG PO CHEW: 1.0000 | CHEWABLE_TABLET | Freq: Two times a day (BID) | ORAL | 11 refills | Status: AC | PRN

## 2024-12-13 NOTE — Patient Instructions (Signed)
 It was a pleasure seeing you today! Your health and satisfaction are our top priorities.  Diane Cone, MD  VISIT SUMMARY: During your visit, we discussed your abdominal pain and diarrhea, which may be related to your history of C. diff infection and recent antibiotic use. We also addressed your recent pneumonia, which may be linked to aspiration and vaping-related lung injury. Additionally, we reviewed your ongoing issues with gastroesophageal reflux disease (GERD) and chronic swallowing difficulties.  YOUR PLAN: -CLOSTRIDIUM DIFFICILE COLITIS: Clostridium difficile colitis is an infection of the colon caused by the bacteria Clostridium difficile, often related to antibiotic use. You should continue taking vancomycin  for a total of 11 days. If diarrhea persists, a DNA test for C. difficile will be done, followed by a toxin test if the DNA test is positive. Avoid medications for cramping to prevent constipation and consume electrolyte-containing fluids like Pedialyte, Gatorade, or chicken broth. Blood work will be done to check your white count and electrolytes if symptoms persist.  -PNEUMONIA SECONDARY TO ASPIRATION AND VAPING-RELATED LUNG INJURY: Your pneumonia is likely due to aspiration from gastrointestinal upset and worsened by vaping-related lung injury. Recovery is expected as your gastrointestinal issues resolve. Promote coughing to clear lung secretions and avoid cough suppressants. A pneumonia vaccination will be given once you have fully recovered.  -GASTROESOPHAGEAL REFLUX DISEASE WITH ESOPHAGITIS: Gastroesophageal reflux disease (GERD) with esophagitis is causing indigestion and increasing your risk of aspiration. Continue taking omeprazole and add Pepcid  Complete to your regimen. If Pepcid  Complete is not covered by your insurance, consider using regular Pepcid  and Tums.  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 Follow up with a GI specialist for further evaluation and management. You have been referred to a new GI specialist for evaluation and potential EGD.  -GENERAL HEALTH MAINTENANCE: A pneumonia vaccination is recommended due to your increased risk from vaping-related lung injury and will be administered once you have fully recovered from pneumonia.  INSTRUCTIONS: Continue taking vancomycin  for a total of 11 days. If diarrhea persists, a DNA test for C. difficile will be done, followed by a toxin test if the DNA test is positive. Avoid medications for cramping to prevent constipation and consume electrolyte-containing fluids like Pedialyte, Gatorade, or chicken broth. Blood work will be done to check your white count and electrolytes if symptoms persist. Promote coughing to clear lung secretions and avoid cough suppressants. A pneumonia vaccination will be given once you have fully recovered. Continue taking omeprazole and add Pepcid  Complete to your regimen. If Pepcid  Complete is not covered by your insurance, consider using regular Pepcid  and Tums. Follow up with a GI sp ecialist for further evaluation and management. You have been referred to a new GI specialist for evaluation and potential EGD.  Your Providers PCP: [name], MD,  [phone]) Referring Provider: [name], MD,  [phone]) Care Team Provider: Gorge Ade, MD,  [phone]) Care Team Provider: Domenica Reusing, MD,  [phone])  NEXT STEPS: [x]  Early Intervention: Schedule sooner appointment, call our on-call services, or go to emergency room if there is any significant Increase in pain or discomfort New or worsening symptoms Sudden or severe changes in your health [x]  Flexible Follow-Up: We recommend a No follow-ups on file. for optimal routine care. This allows for progress monitoring and treatment adjustments. [x]  Preventive  Care: Schedule your annual preventive care visit! It's typically covered by insurance and  helps identify potential health issues early. [x]  Lab & X-ray Appointments: Incomplete tests scheduled today, or call to schedule. X-rays: [facility] (M-F, 8:30am-noon or 1pm-5pm). [x]  Medical Information Release: Sign a release form at front desk to obtain relevant medical information we don't have.  MAKING THE MOST OF OUR FOCUSED 20 MINUTE APPOINTMENTS: [x]   Clearly state your top concerns at the beginning of the visit to focus our discussion [x]   If you anticipate you will need more time, please inform the front desk during scheduling - we can book multiple appointments in the same week. [x]   If you have transportation problems- use our convenient video appointments or ask about transportation support. [x]   We can get down to business faster if you use MyChart to update information before the visit and submit non-urgent questions before your visit. Thank you for taking the time to provide details through MyChart.  Let our nurse know and she can import this information into your encounter documents.  Arrival and Wait Times: [x]   Arriving on time ensures that everyone receives prompt attention. [x]   Early morning (8a) and afternoon (1p) appointments tend to have shortest wait times. [x]   Unfortunately, we cannot delay appointments for late arrivals or hold slots during phone calls.  Getting Answers and Following Up [x]   Simple Questions & Concerns: For quick questions or basic follow-up after your visit, reach us  at [phone] or MyChart messaging. [x]   Complex Concerns: If your concern is more complex, scheduling an appointment might be best. Discuss this with the staff to find the most suitable option. [x]   Lab & Imaging Results: We'll contact you directly if results are abnormal or you don't use MyChart. Most normal results will be on MyChart within 2-3 business days, with a review  message from Dr. Jesus. Haven't heard back in 2 weeks? Need results sooner? Contact us  at [phone]. [x]   Referrals: Our referral coordinator will manage specialist referrals. The specialist's office should contact you within 2 weeks to schedule an appointment. Call us  if you haven't heard from them after 2 weeks.  Staying Connected [x]   MyChart: Activate your MyChart for the fastest way to access results and message us . See the last page of this paperwork for instructions on how to activate.  Bring to Your Next Appointment [x]   Medications: Please bring all your medication bottles to your next appointment to ensure we have an accurate record of your prescriptions. [x]   Health Diaries: If you're monitoring any health conditions at home, keeping a diary of your readings can be very helpful for discussions at your next appointment.  Billing [x]   X-ray & Lab Orders: These are billed by separate companies. Contact the invoicing company directly for questions or concerns. [x]   Visit Charges: Discuss any billing inquiries with our administrative services team.  Your Satisfaction Matters [x]   Share Your Experience: We strive for your satisfaction! If you have any complaints, or preferably compliments, please let Dr. Jesus know directly or contact our Practice Administrators, Manuelita Rubin or [employer], by asking at the front desk.   Reviewing Your Records [x]   Review this early draft of your clinical encounter notes below and the final encounter summary tomorrow on MyChart after its been completed.  All orders placed so far are visible here: C. difficile colitis -     Cdiff NAA+O+P+Stool Culture; Future -     C. difficile GDH and Toxin A/B; Future -     CBC with Differential/Platelet; Future -  Comprehensive metabolic panel with GFR; Future  Pneumonia due to infectious organism, unspecified laterality, unspecified part of lung -     Cdiff NAA+O+P+Stool Culture; Future -     C.  difficile GDH and Toxin A/B; Future -     CBC with Differential/Platelet; Future -     Comprehensive metabolic panel with GFR; Future -     Pneumococcal conjugate vaccine 20-valent  Gastroesophageal reflux disease with esophagitis, unspecified whether hemorrhage -     Pepcid  Complete; Chew 1 tablet by mouth 2 (two) times daily as needed.  Dispense: 100 tablet; Refill: 11 -     Ambulatory referral to Gastroenterology  Intermittent dysphagia  Need for vaccination

## 2024-12-13 NOTE — Progress Notes (Signed)
 ====================================  [facility]: [phone]   --  Virtual Video Medical Office Visit --  Patient: Diane [name]      Age: 44 y.o.       Sex:  female  Date:   12/13/2024 Today's Healthcare Provider: Bernardino KANDICE Cone, MD  ====================================    Chief Complaint/Reason For Visit: Hospitalization Follow-up   Chart reviewed: has Depressive disorder; Anxiety state; Opioid use disorder in remission; IIH (idiopathic intracranial hypertension); back pain, chronic; Knee pain, right, chronic; Ankle pain, chronic; OSA (obstructive sleep apnea); Chronic migraine without aura; Osteochondral lesion; Dysphagia; Intractable pain; Chronic constipation; Other idiopathic scoliosis, lumbar region; Obesity (BMI 30-39.9); Hyperlipidemia; Menopausal syndrome (hot flushes); Prediabetes; Ankle swelling; 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), Choking, Cholelithiasis and acute cholecystitis without obstruction (12/02/2016), Chronic cough (12/27/2013), Depression, Dry mouth (08/12/2017), Dysmenorrhea, Family history of adverse reaction to anesthesia, Gastric ulcer (03/16/2015), GERD (gastroesophageal reflux disease), Headache (07/09/2007), History of gastric ulcer, History of kidney  stones, Hyperlipidemia, IBS (irritable bowel syndrome), Lower extremity edema (07/26/2021), Migraines, Mild obstructive sleep apnea, Nausea and vomiting (12/03/2016), OAB (overactive bladder) (08/12/2017), Osteoarthritis, Osteochondral lesion (09/2016), Osteomyelitis of knee region [facility]), Peripheral vascular disease, Plantar fasciitis of left foot, Pleural effusion (12/15/2013), PONV (postoperative nausea and vomiting), Pseudotumor cerebri, RUQ pain, S/P dilatation of esophageal stricture (07/2018), Sinusitis (08/12/2018), SUI (stress urinary incontinence, female), Thunderclap headache (08/17/2024), Wears contact lenses, and Wears partial dentures.  Discussed the use of AI scribe software for clinical note transcription with the patient, who gave verbal consent to proceed.  History of Present Illness  44 year old female with a history of C. diff infection who presents with abdominal pain and diarrhea.  She has been experiencing significant abdominal cramping and diarrhea for the past two days. The cramping is severe and resembles indigestion in the chest. She is currently on vancomycin  and doxycycline , with the latter prescribed for only four days. She is concerned that the antibiotics may be worsening her gastrointestinal symptoms. No formed stools are reported.  She has a history of C. diff infection and is currently taking oral vancomycin  to prevent recurrence. Her symptoms include a lack of appetite and difficulty eating, with her favorite drinks like Dr. Nunzio not tasting good. She is also experiencing shortness of breath and low energy, which she attributes to her recent pneumonia diagnosis.  She recalls her oxygen  levels dropping to the seventies during her recent illness, leading to purple toes and significant fatigue. She was hospitalized for pneumonia, and she reports a history of vaping and prior gastrointestinal issues. She has a history of esophageal dilation due to swallowing difficulties  and has been taking omeprazole for indigestion.  Her breathing remains compromised, and she is actively trying to cough up mucus. She is concerned about the potential for another pneumonia episode. She also mentions a history of vaping, which she believes contributed to her lung issues.  She is currently taking omeprazole for indigestion  but reports it is not fully effective. She is also using electrolyte-containing fluids like Liquid IV to help with cramping and hydration.   Medications reviewed: Current Outpatient Medications on File Prior to Visit  Medication Sig   albuterol  (PROVENTIL  HFA;VENTOLIN  HFA) 108 (90 Base) MCG/ACT inhaler Inhale 2 puffs into the lungs every 6 (six) hours as needed for wheezing or shortness of breath.    ALPRAZolam  (XANAX ) 0.25 MG tablet Take 1 tablet (0.25 mg total) by mouth daily as needed. Use as little as possible and not after 3pm   atorvastatin  (LIPITOR) 40 MG tablet TAKE 1 TABLET BY MOUTH EVERY DAY   Bacillus Coagulans-Inulin (PROBIOTIC) 1-250 BILLION-MG CAPS    cefdinir  (OMNICEF ) 300 MG capsule Take 1 capsule (300 mg total) by mouth 2 (two) times daily for 4 days.   cetirizine (ZYRTEC) 10 MG tablet Take 10 mg by mouth daily as needed for allergies.   cholestyramine  (QUESTRAN ) 4 g packet Take 1 packet (4 g total) by mouth 3 (three) times daily with meals.   doxycycline  (VIBRA -TABS) 100 MG tablet Take 1 tablet (100 mg total) by mouth every 12 (twelve) hours for 4 days.   EMGALITY  120 MG/ML SOAJ Inject 120 mg into the skin every 30 (thirty) days.   Fezolinetant  (VEOZAH ) 45 MG TABS Take 1 tablet (45 mg total) by mouth daily at 6 (six) AM.   fluticasone  (FLONASE ) 50 MCG/ACT nasal spray Place 2 sprays into both nostrils daily.   fluticasone  (FLONASE ) 50 MCG/ACT nasal spray Place 2 sprays into both nostrils daily.   gabapentin  (NEURONTIN ) 300 MG capsule Take 2 capsules (600 mg total) by mouth 3 (three) times daily. (Patient taking differently: Take 600-1,200 mg by  mouth 2 (two) times daily. Patient takes 600mg  by mouth in the Morning and 1200mg  at night)   ipratropium-albuterol  (DUONEB) 0.5-2.5 (3) MG/3ML SOLN Take 3 mLs by nebulization every 4 (four) hours as needed.   levocetirizine (XYZAL ) 5 MG tablet Take 1 tablet (5 mg total) by mouth every evening.   lidocaine  (XYLOCAINE ) 4 % external solution Apply topically 3 (three) times daily for 5 days.   lisdexamfetamine  (VYVANSE ) 30 MG capsule Take 1 capsule (30 mg total) by mouth daily.   lubiprostone  (AMITIZA ) 24 MCG capsule TAKE ONE CAPSULE (24 MCG DOSE) BY MOUTH 2 (TWO) TIMES DAILY WITH MEALS.   meclizine  (ANTIVERT ) 25 MG tablet Take 1 tablet (25 mg total) by mouth 3 (three) times daily as needed for dizziness.   Multiple Vitamins-Minerals (EQL [facility] WOMENS) TABS    ofloxacin  (OCUFLOX ) 0.3 % ophthalmic solution Place 5 drops into the left ear daily for 5 days.   omeprazole (PRILOSEC) 40 MG capsule TAKE 1 (ONE) CAPSUL CAPSULE BY MOUTH DAILY   ondansetron  (ZOFRAN -ODT) 8 MG disintegrating tablet Take 1 tablet (8 mg total) by mouth every 8 (eight) hours as needed.   oxyCODONE  (ROXICODONE ) 15 MG immediate release tablet Take 1 tablet (15 mg total) by mouth every 4 (four) hours as needed for severe pain (pain score 7-10) or moderate pain (pain score 4-6). PLEASE DO NOT TAKE YOUR NUCYNTA  WHILE YOU ARE TAKING THIS MEDICATION.   potassium chloride  SA (KLOR-CON  M) 20 MEQ tablet Take 1 tablet (20 mEq total) by mouth daily.   progesterone  (PROMETRIUM ) 100 MG capsule Take 3 capsules (300 mg total) by mouth daily.   promethazine  (PHENERGAN ) 25 MG tablet Take 1 tablet (25 mg total) by mouth every 8 (eight) hours as needed for nausea or vomiting.   Respiratory Therapy Supplies (NEBULIZER/TUBING/MOUTHPIECE)  KIT 1 each by Does not apply route every 4 (four) hours as needed.   saccharomyces boulardii (FLORASTOR) 250 MG capsule Take 1 capsule (250 mg total) by mouth 2 (two) times daily.   senna-docusate (SENOKOT-S) 8.6-50 MG  tablet Take 1 tablet by mouth daily.   sodium chloride  (SALINE MIST) 0.65 % nasal spray Use nightly for sinus hygiene long-term. Can also be used as many times daily as desired to assist with cleaning congested sinuses.   Tapentadol  HCl (NUCYNTA ) 100 MG TABS Take 1 tablet (100 mg total) by mouth every 8 (eight) hours as needed.   tiZANidine  (ZANAFLEX ) 4 MG tablet Take 4 mg by mouth every 6 (six) hours as needed for muscle spasms.    traZODone (DESYREL) 50 MG tablet Take 1-2 tablets by mouth at bedtime as needed.   vancomycin  (VANCOCIN ) 125 MG capsule Take 1 capsule (125 mg total) by mouth daily for 11 days.   venlafaxine  XR (EFFEXOR -XR) 150 MG 24 hr capsule TAKE 2 CAPS BY MOUTH DAILY   zolmitriptan  (ZOMIG ) 5 MG tablet TAKE 1 TABLET BY MOUTH AS NEEDED FOR MIGRAINE.   zonisamide  (ZONEGRAN ) 100 MG capsule Take 150 mg by mouth at bedtime.   zonisamide  (ZONEGRAN ) 25 MG capsule Take 1 capsule (25 mg total) by mouth in the morning, at noon, and at bedtime. (Patient taking differently: Take 50 mg by mouth daily. Patient takes this medication along with the 100 mg capsule of zonisamide  for a total of 150mg )   Current Facility-Administered Medications on File Prior to Visit  Medication   Erenumab -aooe SOAJ 140 mg  There are no discontinued medications.     Virtual Physical Exam: Exam Context: Evaluation limited by virtual format; however, patient is clearly visualized, cooperative, and engaged throughout. General Appearance: Well-developed, well-nourished; no acute distress by limited video assessment. Pulmonary: No respiratory distress apparent; normal work of breathing observed. Neurological: Patient is awake, alert, and demonstrates no obvious focal neurological deficits or cognitive impairments; sensorium appears unclouded. Psychiatric/Mental Status: Mood is appropriate; demeanor is pleasant, calm, and articulate. Speech is coherent and goal-directed with no evidence of slurred or pressured speech.  No abnormal psychomotor activity noted.  Admission on 12/07/2024, Discharged on 12/10/2024  Component Date Value   SARS Coronavirus 2 by RT* 12/07/2024 NEGATIVE    Influenza A by PCR 12/07/2024 NEGATIVE    Influenza B by PCR 12/07/2024 NEGATIVE    Resp Syncytial Virus by * 12/07/2024 NEGATIVE    WBC 12/07/2024 3.4 (L)    RBC 12/07/2024 4.44    Hemoglobin 12/07/2024 12.4    HCT 12/07/2024 37.2    MCV 12/07/2024 83.8    MCH 12/07/2024 27.9    MCHC 12/07/2024 33.3    RDW 12/07/2024 13.9    Platelets 12/07/2024 183    nRBC 12/07/2024 0.0    Neutrophils Relative % 12/07/2024 50    Neutro Abs 12/07/2024 1.7    Lymphocytes Relative 12/07/2024 42    Lymphs Abs 12/07/2024 1.4    Monocytes Relative 12/07/2024 7    Monocytes Absolute 12/07/2024 0.2    Eosinophils Relative 12/07/2024 1    Eosinophils Absolute 12/07/2024 0.0    Basophils Relative 12/07/2024 0    Basophils Absolute 12/07/2024 0.0    Immature Granulocytes 12/07/2024 0    Abs Immature Granulocytes 12/07/2024 0.01    Sodium 12/07/2024 132 (L)    Potassium 12/07/2024 4.4    Chloride 12/07/2024 99    CO2 12/07/2024 22    Glucose, Bld 12/07/2024 89  BUN 12/07/2024 11    Creatinine, Ser 12/07/2024 0.87    Calcium  12/07/2024 9.1    Total Protein 12/07/2024 7.3    Albumin 12/07/2024 3.8    AST 12/07/2024 28    ALT 12/07/2024 13    Alkaline Phosphatase 12/07/2024 139 (H)    Total Bilirubin 12/07/2024 0.3    GFR, Estimated 12/07/2024 >60    Anion gap 12/07/2024 12    Specimen Description 12/08/2024                     Value:BLOOD SITE NOT SPECIFIED Performed at [facility], [address], [place], [place] [address]    Special Requests 12/08/2024                     Value:BOTTLES DRAWN AEROBIC AND ANAEROBIC Blood Culture results may not be optimal due to an inadequate volume of blood received in culture bottles Performed at [facility], [address] [place], [place] [address]     Culture 12/08/2024                     Value:NO GROWTH 5 DAYS Performed at [facility], [address] [place], [place] [address]    Report Status 12/08/2024 12/13/2024 FINAL    Specimen Description 12/08/2024                     Value:BLOOD RIGHT ANTECUBITAL Performed at [facility], [address] [place], [place] [address]    Special Requests 12/08/2024                     Value:BOTTLES DRAWN AEROBIC AND ANAEROBIC Blood Culture results may not be optimal due to an inadequate volume of blood received in culture bottles Performed at [facility], [address] [place], [place] [address]    Culture 12/08/2024                     Value:NO GROWTH 5 DAYS Performed at [facility], [address], [place], [place] [address]    Report Status 12/08/2024 12/13/2024 FINAL    WBC 12/08/2024 3.0 (L)    RBC 12/08/2024 4.16    Hemoglobin 12/08/2024 11.3 (L)    HCT 12/08/2024 36.0    MCV 12/08/2024 86.5    MCH 12/08/2024 27.2    MCHC 12/08/2024 31.4    RDW 12/08/2024 13.9    Platelets 12/08/2024 169    nRBC 12/08/2024 0.0    Neutrophils Relative % 12/08/2024 82    Neutro Abs 12/08/2024 2.4    Lymphocytes Relative 12/08/2024 15    Lymphs Abs 12/08/2024 0.5 (L)    Monocytes Relative 12/08/2024 3    Monocytes Absolute 12/08/2024 0.1    Eosinophils Relative 12/08/2024 0    Eosinophils Absolute 12/08/2024 0.0    Basophils Relative 12/08/2024 0    Basophils Absolute 12/08/2024 0.0    WBC Morphology 12/08/2024 MORPHOLOGY UNREMARKABLE    RBC Morphology 12/08/2024 MORPHOLOGY UNREMARKABLE    Smear Review 12/08/2024 Normal platelet morphology    Immature Granulocytes 12/08/2024 0    Abs Immature Granulocytes 12/08/2024 0.01    Sodium 12/08/2024 136    Potassium 12/08/2024 3.7    Chloride 12/08/2024 103    CO2 12/08/2024 21 (L)    Glucose, Bld 12/08/2024 183 (H)    BUN 12/08/2024 12    Creatinine, Ser 12/08/2024 0.74  Calcium   12/08/2024 8.8 (L)    GFR, Estimated 12/08/2024 >60    Anion gap 12/08/2024 12    Magnesium  12/08/2024 2.0    Phosphorus 12/08/2024 2.2 (L)    Hgb A1c MFr Bld 12/08/2024 6.1 (H)    Mean Plasma Glucose 12/08/2024 128.37    Glucose-Capillary 12/08/2024 228 (H)    Glucose-Capillary 12/08/2024 165 (H)    Glucose-Capillary 12/08/2024 221 (H)    [facility] test code 12/08/2024 [id number]    [facility] test name 12/08/2024 [id number]    Source ([facility]) 12/08/2024 SERUM    Misc [facility] result 12/08/2024 COMMENT    WBC 12/09/2024 4.1    RBC 12/09/2024 3.87    Hemoglobin 12/09/2024 10.4 (L)    HCT 12/09/2024 33.7 (L)    MCV 12/09/2024 87.1    MCH 12/09/2024 26.9    MCHC 12/09/2024 30.9    RDW 12/09/2024 14.3    Platelets 12/09/2024 194    nRBC 12/09/2024 0.0    Sodium 12/09/2024 141    Potassium 12/09/2024 3.5    Chloride 12/09/2024 107    CO2 12/09/2024 23    Glucose, Bld 12/09/2024 138 (H)    BUN 12/09/2024 9    Creatinine, Ser 12/09/2024 0.61    Calcium  12/09/2024 9.4    GFR, Estimated 12/09/2024 >60    Anion gap 12/09/2024 12    Phosphorus 12/09/2024 4.7 (H)    Glucose-Capillary 12/08/2024 133 (H)    Glucose-Capillary 12/09/2024 130 (H)    Glucose-Capillary 12/09/2024 125 (H)    Glucose-Capillary 12/09/2024 147 (H)    Comment 1 12/09/2024 Notify RN    WBC 12/10/2024 5.1    RBC 12/10/2024 3.99    Hemoglobin 12/10/2024 10.7 (L)    HCT 12/10/2024 35.0 (L)    MCV 12/10/2024 87.7    MCH 12/10/2024 26.8    MCHC 12/10/2024 30.6    RDW 12/10/2024 14.3    Platelets 12/10/2024 141 (L)    nRBC 12/10/2024 0.0    Glucose-Capillary 12/09/2024 169 (H)    Glucose-Capillary 12/10/2024 115 (H)    Comment 1 12/10/2024 Notify RN    Glucose-Capillary 12/10/2024 106 (H)    Comment 1 12/10/2024 Notify RN    Glucose-Capillary 12/10/2024 154 (H)    Comment 1 12/10/2024 Notify RN   Office Visit on 11/25/2024  Component Date Value   Toxigenic C. Difficile b* 11/28/2024 Positive (A)    C difficile  Toxins A+B, * 11/28/2024 Negative    WBC 11/25/2024 5.2    RBC 11/25/2024 4.73    Hemoglobin 11/25/2024 13.0    HCT 11/25/2024 39.8    MCV 11/25/2024 84.0    MCHC 11/25/2024 32.7    RDW 11/25/2024 14.5    Platelets 11/25/2024 240.0    Neutrophils Relative % 11/25/2024 60.2    Lymphocytes Relative 11/25/2024 30.1    Monocytes Relative 11/25/2024 6.1    Eosinophils Relative 11/25/2024 2.7    Basophils Relative 11/25/2024 0.9    Neutro Abs 11/25/2024 3.1    Lymphs Abs 11/25/2024 1.6    Monocytes Absolute 11/25/2024 0.3    Eosinophils Absolute 11/25/2024 0.1    Basophils Absolute 11/25/2024 0.0    Sodium 11/25/2024 137    Potassium 11/25/2024 4.0    Chloride 11/25/2024 105    CO2 11/25/2024 25    Glucose, Bld 11/25/2024 93    BUN 11/25/2024 11    Creatinine, Ser 11/25/2024 0.79    Total Bilirubin 11/25/2024 0.3    Alkaline Phosphatase 11/25/2024 121 (H)    AST 11/25/2024  19    ALT 11/25/2024 16    Total Protein 11/25/2024 7.7    Albumin 11/25/2024 4.4    GFR 11/25/2024 92.37    Calcium  11/25/2024 9.3    Color, Urine 11/25/2024 YELLOW    APPearance 11/25/2024 CLOUDY (A)    Specific Gravity, Urine 11/25/2024 1.026    pH 11/25/2024 5.5    Glucose, UA 11/25/2024 NEGATIVE    Bilirubin Urine 11/25/2024 NEGATIVE    Ketones, ur 11/25/2024 NEGATIVE    Hgb urine dipstick 11/25/2024 NEGATIVE    Protein, ur 11/25/2024 NEGATIVE    Nitrites, Initial 11/25/2024 NEGATIVE    Leukocyte Esterase 11/25/2024 1+ (A)    WBC, UA 11/25/2024 NONE SEEN    RBC / HPF 11/25/2024 NONE SEEN    Squamous Epithelial / HPF 11/25/2024 10-20 (A)    Bacteria, UA 11/25/2024 NONE SEEN    Calcium  Oxalate Crystal 11/25/2024 MANY (A)    Hyaline Cast 11/25/2024 NONE SEEN    Note 11/25/2024     MICRO NUMBER: 11/25/2024 [id number]    SPECIMEN QUALITY: 11/25/2024 Adequate    Sample Source 11/25/2024 URINE    STATUS: 11/25/2024 FINAL    Result: 11/25/2024                     Value:Mixed genital flora isolated.  These superficial bacteria are not indicative of a urinary tract infection. No further organism identification is warranted on this specimen. If clinically indicated, recollect clean-catch, mid-stream urine and transfer  immediately to Urine Culture Transport Tube.    REFLEXIVE URINE CULTURE 11/25/2024    Lab on 11/08/2024  Component Date Value   Color, Urine 11/08/2024 DARK YELLOW    APPearance 11/08/2024 CLOUDY (A)    Specific Gravity, Urine 11/08/2024 1.031    pH 11/08/2024 5.5    Glucose, UA 11/08/2024 NEGATIVE    Bilirubin Urine 11/08/2024 NEGATIVE    Ketones, ur 11/08/2024 TRACE (A)    Hgb urine dipstick 11/08/2024 NEGATIVE    Protein, ur 11/08/2024 TRACE (A)    Nitrites, Initial 11/08/2024 NEGATIVE    Leukocyte Esterase 11/08/2024 NEGATIVE    WBC, UA 11/08/2024 0-5    RBC / HPF 11/08/2024 NONE SEEN    Squamous Epithelial / HPF 11/08/2024 40-60 (A)    Bacteria, UA 11/08/2024 MODERATE (A)    Calcium  Oxalate Crystal 11/08/2024 MANY (A)    Hyaline Cast 11/08/2024 40-60 (A)    Yeast 11/08/2024 NONE SEEN    Note 11/08/2024     Creatinine, Urine 11/08/2024 251    Protein/Creat Ratio 11/08/2024 100    Protein/Creatinine Ratio 11/08/2024 0.100    Total Protein, Urine 11/08/2024 25 (H)    Magnesium  11/08/2024 1.6    Sodium 11/08/2024 137    Potassium 11/08/2024 3.2 (L)    Chloride 11/08/2024 103    CO2 11/08/2024 23    Glucose, Bld 11/08/2024 107 (H)    BUN 11/08/2024 14    Creatinine, Ser 11/08/2024 0.78    Total Bilirubin 11/08/2024 0.4    Alkaline Phosphatase 11/08/2024 109    AST 11/08/2024 21    ALT 11/08/2024 19    Total Protein 11/08/2024 7.1    Albumin 11/08/2024 4.1    GFR 11/08/2024 93.82    Calcium  11/08/2024 9.3    Reflexve Urine Culture 11/08/2024    Admission on 10/31/2024, Discharged on 11/01/2024  Component Date Value   Lipase 10/31/2024 23    Sodium 10/31/2024 140    Potassium 10/31/2024 2.9 (L)    Chloride 10/31/2024 103  CO2 10/31/2024 23     Glucose, Bld 10/31/2024 94    BUN 10/31/2024 7    Creatinine, Ser 10/31/2024 0.89    Calcium  10/31/2024 9.5    Total Protein 10/31/2024 7.7    Albumin 10/31/2024 4.3    AST 10/31/2024 26    ALT 10/31/2024 19    Alkaline Phosphatase 10/31/2024 141 (H)    Total Bilirubin 10/31/2024 0.3    GFR, Estimated 10/31/2024 >60    Anion gap 10/31/2024 14    WBC 10/31/2024 7.9    RBC 10/31/2024 4.87    Hemoglobin 10/31/2024 13.3    HCT 10/31/2024 41.6    MCV 10/31/2024 85.4    MCH 10/31/2024 27.3    MCHC 10/31/2024 32.0    RDW 10/31/2024 13.6    Platelets 10/31/2024 280    nRBC 10/31/2024 0.0    Color, Urine 10/31/2024 YELLOW    APPearance 10/31/2024 HAZY (A)    Specific Gravity, Urine 10/31/2024 1.029    pH 10/31/2024 5.0    Glucose, UA 10/31/2024 NEGATIVE    Hgb urine dipstick 10/31/2024 NEGATIVE    Bilirubin Urine 10/31/2024 NEGATIVE    Ketones, ur 10/31/2024 5 (A)    Protein, ur 10/31/2024 100 (A)    Nitrite 10/31/2024 NEGATIVE    Leukocytes,Ua 10/31/2024 SMALL (A)    RBC / HPF 10/31/2024 0-5    WBC, UA 10/31/2024 11-20    Bacteria, UA 10/31/2024 RARE (A)    Squamous Epithelial / HPF 10/31/2024 21-50    Mucus 10/31/2024 PRESENT    Hyaline Casts, UA 10/31/2024 PRESENT    Ca Oxalate Crys, UA 10/31/2024 PRESENT    Fecal Occult Bld 10/31/2024 NEGATIVE    Campylobacter species 10/31/2024 NOT DETECTED    Plesimonas shigelloides 10/31/2024 NOT DETECTED    Salmonella species 10/31/2024 NOT DETECTED    Yersinia enterocolitica 10/31/2024 NOT DETECTED    Vibrio species 10/31/2024 NOT DETECTED    Vibrio cholerae 10/31/2024 NOT DETECTED    Enteroaggregative E coli* 10/31/2024 NOT DETECTED    Enteropathogenic E coli * 10/31/2024 NOT DETECTED    Enterotoxigenic E coli (* 10/31/2024 NOT DETECTED    Shiga like toxin produci* 10/31/2024 NOT DETECTED    Shigella/Enteroinvasive * 10/31/2024 NOT DETECTED    Cryptosporidium 10/31/2024 NOT DETECTED    Cyclospora cayetanensis 10/31/2024 NOT  DETECTED    Entamoeba histolytica 10/31/2024 NOT DETECTED    Giardia lamblia 10/31/2024 NOT DETECTED    Adenovirus F40/41 10/31/2024 NOT DETECTED    Astrovirus 10/31/2024 NOT DETECTED    Norovirus GI/GII 10/31/2024 NOT DETECTED    Rotavirus A 10/31/2024 NOT DETECTED    Sapovirus (I, II, IV, an* 10/31/2024 NOT DETECTED    C Diff antigen 10/31/2024 POSITIVE (A)    C Diff toxin 10/31/2024 NEGATIVE    C Diff interpretation 10/31/2024 Results are indeterminate. See PCR results.    Toxigenic C. Difficile b* 10/31/2024 POSITIVE (A)    Hypervirulent Strain 10/31/2024 PRESUMPTIVE NEGATIVE   Lab on 10/27/2024  Component Date Value   LACTIC ACID 10/27/2024 1.6    Lipase 10/27/2024 22.0    Amylase 10/27/2024 24 (L)    Sodium 10/27/2024 138    Potassium 10/27/2024 3.1 (L)    Chloride 10/27/2024 102    CO2 10/27/2024 24    Glucose, Bld 10/27/2024 107 (H)    BUN 10/27/2024 9    Creatinine, Ser 10/27/2024 0.89    Total Bilirubin 10/27/2024 0.3    Alkaline Phosphatase 10/27/2024 123 (H)    AST 10/27/2024 24  ALT 10/27/2024 19    Total Protein 10/27/2024 7.5    Albumin 10/27/2024 4.4    GFR 10/27/2024 80.10    Calcium  10/27/2024 9.5    WBC 10/27/2024 5.9    RBC 10/27/2024 4.54    Hemoglobin 10/27/2024 12.4    HCT 10/27/2024 37.8    MCV 10/27/2024 83.3    MCHC 10/27/2024 32.8    RDW 10/27/2024 14.1    Platelets 10/27/2024 247.0    Neutrophils Relative % 10/27/2024 58.9    Lymphocytes Relative 10/27/2024 29.4    Monocytes Relative 10/27/2024 7.8    Eosinophils Relative 10/27/2024 2.9    Basophils Relative 10/27/2024 1.0    Neutro Abs 10/27/2024 3.5    Lymphs Abs 10/27/2024 1.7    Monocytes Absolute 10/27/2024 0.5    Eosinophils Absolute 10/27/2024 0.2    Basophils Absolute 10/27/2024 0.1   Office Visit on 10/19/2024  Component Date Value   Influenza A, POC 10/19/2024 Negative    Influenza B, POC 10/19/2024 Negative    SARS Coronavirus 2 Ag 10/19/2024 Negative   Admission on  09/01/2024, Discharged on 09/01/2024  Component Date Value   WBC 09/01/2024 10.1    RBC 09/01/2024 4.47    Hemoglobin 09/01/2024 12.3    HCT 09/01/2024 38.4    MCV 09/01/2024 85.9    MCH 09/01/2024 27.5    MCHC 09/01/2024 32.0    RDW 09/01/2024 14.3    Platelets 09/01/2024 310    nRBC 09/01/2024 0.0    Neutrophils Relative % 09/01/2024 90    Neutro Abs 09/01/2024 9.1 (H)    Lymphocytes Relative 09/01/2024 6    Lymphs Abs 09/01/2024 0.6 (L)    Monocytes Relative 09/01/2024 4    Monocytes Absolute 09/01/2024 0.4    Eosinophils Relative 09/01/2024 0    Eosinophils Absolute 09/01/2024 0.0    Basophils Relative 09/01/2024 0    Basophils Absolute 09/01/2024 0.0    Immature Granulocytes 09/01/2024 0    Abs Immature Granulocytes 09/01/2024 0.04    Sodium 09/01/2024 138    Potassium 09/01/2024 3.7    Chloride 09/01/2024 108    CO2 09/01/2024 18 (L)    Glucose, Bld 09/01/2024 143 (H)    BUN 09/01/2024 9    Creatinine, Ser 09/01/2024 1.01 (H)    Calcium  09/01/2024 9.3    GFR, Estimated 09/01/2024 >60    Anion gap 09/01/2024 12   Admission on 07/29/2024, Discharged on 07/30/2024  Component Date Value   Sodium 07/29/2024 136    Potassium 07/29/2024 3.8    Chloride 07/29/2024 101    CO2 07/29/2024 21 (L)    Glucose, Bld 07/29/2024 106 (H)    BUN 07/29/2024 7    Creatinine, Ser 07/29/2024 0.86    Calcium  07/29/2024 9.1    GFR, Estimated 07/29/2024 >60    Anion gap 07/29/2024 14    WBC 07/29/2024 8.3    RBC 07/29/2024 4.49    Hemoglobin 07/29/2024 12.1    HCT 07/29/2024 38.7    MCV 07/29/2024 86.2    MCH 07/29/2024 26.9    MCHC 07/29/2024 31.3    RDW 07/29/2024 14.6    Platelets 07/29/2024 362    nRBC 07/29/2024 0.0    Neutrophils Relative % 07/29/2024 54    Neutro Abs 07/29/2024 4.4    Lymphocytes Relative 07/29/2024 35    Lymphs Abs 07/29/2024 3.0    Monocytes Relative 07/29/2024 6    Monocytes Absolute 07/29/2024 0.5    Eosinophils Relative 07/29/2024 4     Eosinophils  Absolute 07/29/2024 0.4    Basophils Relative 07/29/2024 1    Basophils Absolute 07/29/2024 0.1    Immature Granulocytes 07/29/2024 0    Abs Immature Granulocytes 07/29/2024 0.02   Office Visit on 03/02/2024  Component Date Value   D-Dimer, Quant 03/02/2024 1.69 (H)   Admission on 02/29/2024, Discharged on 03/01/2024  Component Date Value   Sodium 02/29/2024 139    Potassium 02/29/2024 3.5    Chloride 02/29/2024 103    CO2 02/29/2024 26    Glucose, Bld 02/29/2024 112 (H)    BUN 02/29/2024 9    Creatinine, Ser 02/29/2024 0.71    Calcium  02/29/2024 8.8 (L)    GFR, Estimated 02/29/2024 >60    Anion gap 02/29/2024 10    WBC 02/29/2024 8.9    RBC 02/29/2024 4.48    Hemoglobin 02/29/2024 12.6    HCT 02/29/2024 38.6    MCV 02/29/2024 86.2    MCH 02/29/2024 28.1    MCHC 02/29/2024 32.6    RDW 02/29/2024 13.9    Platelets 02/29/2024 313    nRBC 02/29/2024 0.0    Troponin I (High Sensiti* 02/29/2024 3    TSH 02/29/2024 1.090   There may be more visits with results that are not included.  No image results found. CT TEMPORAL BONES W CONTRAST Result Date: 12/10/2024 EXAM: CT TEMPORAL BONES WITH CONTRAST 12/10/2024 11:31:22 AM TECHNIQUE: CT of the temporal bones was performed with the administration of intravenous contrast. Multiplanar reformatted images are provided for review. Automated exposure control, iterative reconstruction, and/or weight based adjustment of the mA/kV was utilized to reduce the radiation dose to as low as reasonably achievable. CONTRAST: 100 mL of Omnipaque  350. COMPARISON: MRI brain 08/22/2024. CTA head and neck 07/29/2024. CLINICAL HISTORY: 44 year old female with left ear and mastoid pain. FINDINGS: RIGHT TEMPORAL BONE: EXTERNAL AUDITORY CANAL: Clear. No bony erosion. Scutum is intact. MIDDLE EAR CAVITY: Right tympanic membrane is intact. Ossicular chain is intact. MASTOID AIR CELLS: Clear. INNER EAR: The cochlea and vestibule are unremarkable. Normal  mineralization of the otic capsule. Thinning of bone or dehiscence over the right superior semicircular canal (series 11 image 184). Otherwise, right semicircular canals are negative. The vestibular aqueduct is not dilated. INTERNAL AUDITORY CANAL: Unremarkable. Normal bony canal of the facial nerve. Normal course of the right 7th nerve and stylomastoid foramen. LEFT TEMPORAL BONE: EXTERNAL AUDITORY CANAL: Clear. No bony erosion. Scutum is intact. MIDDLE EAR CAVITY: Left tympanic membrane is intact. Ossicular chain is intact. MASTOID AIR CELLS: Clear. INNER EAR: The cochlea and vestibule are unremarkable. Normal mineralization of the otic capsule. Thinning of bone over the left superior semicircular canal (series 11 image 185). Otherwise, left semicircular canals are negative. The vestibular aqueduct is not dilated. INTERNAL AUDITORY CANAL: Unremarkable. Normal bony canal of the facial nerve. Normal course of the left 7th nerve and stylomastoid foramen. VASCULAR: Normal jugular bulbs. Normal carotid canals. Major vascular structures at the visible skull base are enhancing and patent. BRAIN: Negative visible brain parenchyma. ORBITS: Negative visible orbit soft tissues. SINUSES: Scattered mild new paranasal sinus mucosal thickening with bubbly opacity. Trace layering sinus fluid. Small volume retained secretions in the nasal cavity, also nasopharynx. SOFT TISSUES: Negative visible deep soft tissue spaces of the face. IMPRESSION: 1. Tympanic cavities and mastoids are clear, negative temporal bones (see #2). 2. Incidental thinning of bone or dehiscence of the superior semicircular canals, worse on the right. 3. Evidence of acute Rhinosinusitis. Electronically signed by: Helayne Hurst MD 12/10/2024 11:45 AM EST RP  Workstation: [id number]   DG Chest 2 View Result Date: 12/07/2024 CLINICAL DATA:  Cough with fever and flu like symptoms 4 days. EXAM: CHEST - 2 VIEW COMPARISON:  02/29/2024 FINDINGS: Lungs are adequately  inflated with multifocal airspace process predominately over the right lung and possibly involving the left base/retrocardiac region. This airspace process worse over the right upper lobe. These findings likely due to multifocal pneumonia. No effusion. Cardiomediastinal silhouette and remainder of the exam is unchanged. IMPRESSION: Multifocal airspace process likely due to multifocal pneumonia. Electronically Signed   By: Toribio Agreste M.D.   On: 12/07/2024 16:51   MM 3D SCREENING MAMMOGRAM BILATERAL BREAST Result Date: 11/25/2024 CLINICAL DATA:  Screening. EXAM: DIGITAL SCREENING BILATERAL MAMMOGRAM WITH TOMOSYNTHESIS AND CAD TECHNIQUE: Bilateral screening digital craniocaudal and mediolateral oblique mammograms were obtained. Bilateral screening digital breast tomosynthesis was performed. The images were evaluated with computer-aided detection. COMPARISON:  Previous exam(s). ACR Breast Density Category c: The breasts are heterogeneously dense, which may obscure small masses. FINDINGS: There are no findings suspicious for malignancy. IMPRESSION: No mammographic evidence of malignancy. A result letter of this screening mammogram will be mailed directly to the patient. RECOMMENDATION: Screening mammogram in one year. (Code:[id number]) BI-RADS CATEGORY  1: Negative. Electronically Signed   By: Alm Parkins M.D.   On: 11/25/2024 08:10   CT ABDOMEN PELVIS W CONTRAST Result Date: 10/31/2024 EXAM: CT ABDOMEN AND PELVIS WITH CONTRAST 10/31/2024 08:05:39 PM TECHNIQUE: CT of the abdomen and pelvis was performed with the administration of 100 mL of iohexol  (OMNIPAQUE ) 300 MG/ML solution. Multiplanar reformatted images are provided for review. Automated exposure control, iterative reconstruction, and/or weight-based adjustment of the mA/kV was utilized to reduce the radiation dose to as low as reasonably achievable. COMPARISON: Abdominal radiographs 10/28/2024 and CT abdomen and pelvis 06/20/2023. CLINICAL HISTORY:  Abdominal pain, acute, nonlocalized. Vomiting for 2.5 weeks. Bloody discharge per rectum. Abnormal bowel movements. FINDINGS: LOWER CHEST: Lung bases are clear. LIVER: The liver is unremarkable. GALLBLADDER AND BILE DUCTS: Surgical absence of the gallbladder. No biliary ductal dilatation. SPLEEN: The spleen is unremarkable. PANCREAS: The pancreas is unremarkable. ADRENAL GLANDS: The adrenal glands are unremarkable. KIDNEYS, URETERS AND BLADDER: The kidneys are unremarkable. No stones in the kidneys or ureters. No hydronephrosis. No perinephric or periureteral stranding. The bladder is decompressed. GI AND BOWEL: Stomach and small bowel are decompressed. Liquid stool demonstrated throughout the colon without wall thickening or inflammatory stranding. No colonic distention. The appendix is normal. PERITONEUM AND RETROPERITONEUM: No ascites. No free air. VASCULATURE: The abdominal aorta is unremarkable. LYMPH NODES: Retroperitoneal lymph nodes are unremarkable. REPRODUCTIVE ORGANS: No acute abnormality. BONES AND SOFT TISSUES: No acute osseous abnormality. No focal soft tissue abnormality. IMPRESSION: 1. Fluid throughout the colon consistent with liquid stool suggesting possible infectious colitis. No wall thickening or obstruction. Electronically signed by: Elsie Gravely MD 10/31/2024 08:15 PM EST RP Workstation: [id number]   DG Abd 2 Views Result Date: 10/31/2024 CLINICAL DATA:  Generalized abdominal pain. Gastrointestinal hemorrhage. EXAM: ABDOMEN - 2 VIEW COMPARISON:  Abdominopelvic CT 06/20/2023 FINDINGS: No free intra-abdominal air. No bowel dilatation or evidence of obstruction. Few bowel air-fluid levels are seen in the right upper quadrant. Small volume of formed stool in the colon. Rounded densities in the right abdomen likely retained enteric contents. There are multiple pelvic phleboliths. Cholecystectomy clips in the right upper quadrant. No visible radiopaque calculi. No acute osseous findings.  IMPRESSION: Few bowel air-fluid levels in the right upper quadrant, can be seen with enteritis or diarrheal process. No  bowel obstruction or free air. Electronically Signed   By: Andrea Gasman M.D.   On: 10/31/2024 11:39  DG Chest 2 View Result Date: 12/07/2024 CLINICAL DATA:  Cough with fever and flu like symptoms 4 days. EXAM: CHEST - 2 VIEW COMPARISON:  02/29/2024 FINDINGS: Lungs are adequately inflated with multifocal airspace process predominately over the right lung and possibly involving the left base/retrocardiac region. This airspace process worse over the right upper lobe. These findings likely due to multifocal pneumonia. No effusion. Cardiomediastinal silhouette and remainder of the exam is unchanged. IMPRESSION: Multifocal airspace process likely due to multifocal pneumonia. Electronically Signed   By: Toribio Agreste M.D.   On: 12/07/2024 16:51        ASSESSMENT & PLAN   Assessment & Plan C. 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 Vancomycin  is being used to manage the infection, though cramping may be due to vancomycin  rather than active infection. Lifelong oral vancomycin  prevention is not universally recommended, but may be considered for the current year until gut health is restored. Continue vancomycin  for a total of 11 days. A DNA test for C. difficile is ordered if diarrhea persists, followed by a toxin test if the DNA test is positive. She is advised against using medications for cramping to avoid constipation and should consume electrolyte-containing fluids like Pedialyte, Gatorade, or chicken broth. Blood work is ordered to check white count and electrolytes if symptoms persist. Pneumonia due to infectious organism, unspecified laterality, unspecified part of lung Pneumonia secondary to aspiration and vaping-related lung injury   Pneumonia is  likely secondary to aspiration from gastrointestinal upset and exacerbated by vaping-related lung injury, with symptoms of shortness of breath and low oxygen  levels. Recovery is expected with resolution of gastrointestinal issues. She should promote coughing to clear lung secretions and avoid cough suppressants. Pneumonia vaccination is ordered once recovery is complete. Gastroesophageal reflux disease with esophagitis, unspecified whether hemorrhage Gastroesophageal reflux disease with esophagitis   GERD with esophagitis is contributing to aspiration risk, with symptoms of indigestion and potential regurgitation. Current treatment with omeprazole is not fully controlling symptoms. Continue omeprazole and add Pepcid  Complete to the regimen, or consider regular Pepcid  and Tums if Pepcid  Complete is not covered by insurance. Intermittent dysphagia Chronic dysphagia with history of esophageal dilation   Chronic dysphagia with previous esophageal dilation may contribute to aspiration pneumonia. Follow-up with a GI specialist is necessary for further evaluation and management. She is referred to a GI specialist for evaluation and potential EGD and released from the previous GI specialist to facilitate the new referral. Need for vaccination General Health Maintenance   Pneumonia vaccination is recommended due to increased risk from vaping-related lung injury and will be administered once recovery from pneumonia is complete.  ORDER ASSOCIATIONS  #   DIAGNOSIS / CONDITION ICD-10 ENCOUNTER ORDER     ICD-10-CM   1. C. difficile colitis  A04.72 Cdiff NAA+O+P+Stool Culture    C. difficile GDH and Toxin A/B    CBC with Differential/Platelet    Comprehensive metabolic panel with GFR    2. Pneumonia due to infectious organism, unspecified laterality, unspecified part of lung  J18.9 Cdiff NAA+O+P+Stool Culture    C. difficile GDH and Toxin A/B    CBC with Differential/Platelet    Comprehensive metabolic panel  with GFR    Pneumococcal conjugate vaccine 20-valent (Prevnar 20)    3. Gastroesophageal reflux disease  with esophagitis, unspecified whether hemorrhage  K21.00 famotidine -calcium  carbonate-magnesium  hydroxide (PEPCID  COMPLETE) 10-800-165 MG chewable tablet    Ambulatory referral to Gastroenterology    4. Intermittent dysphagia  R13.19     5. Need for vaccination  Z23           Orders Placed in Encounter:   Lab Orders         Cdiff NAA+O+P+Stool Culture         C. difficile GDH and Toxin A/B         CBC with Differential/Platelet         Comprehensive metabolic panel with GFR     Meds ordered this encounter  Medications   famotidine -calcium  carbonate-magnesium  hydroxide (PEPCID  COMPLETE) 10-800-165 MG chewable tablet    Sig: Chew 1 tablet by mouth 2 (two) times daily as needed.    Dispense:  100 tablet    Refill:  11    Orders Placed This Encounter  Procedures   Cdiff NAA+O+P+Stool Culture    Standing Status:   Future    Expiration Date:   12/13/2025   Pneumococcal conjugate vaccine 20-valent (Prevnar 20)   C. difficile GDH and Toxin A/B    C. diff GDH and toxin A/B test:  Method: Enzyme immunoassay (EIA) Detects: Glutamate dehydrogenase (GDH) antigen and C. diff toxins A and B Turnaround time: Rapid (hours) Sensitivity/specificity: Moderate When to order:  Initial screening test In settings with limited resources When rapid results are needed    C. diff PCR test:  Method: Polymerase chain reaction (PCR) Detects: Toxin genes (usually tcdB) Turnaround time: Longer (1-2 days) Sensitivity/specificity: High When to order:  Confirmatory test after positive GDH/negative toxin result In high-risk patients or during outbreaks When highest accuracy is required    Key differences:  Detection target: Antigens/toxins vs. genes Accuracy: PCR is more sensitive and specific Time and cost: GDH/toxin test is faster and cheaper  Many labs use a two- or three-step  algorithm, starting with GDH/toxin test and reflexing to PCR if needed. The choice depends on clinical context, lab capabilities, and institutional protocols.    Standing Status:   Future    Expiration Date:   12/13/2025   CBC with Differential/Platelet    Standing Status:   Future    Expiration Date:   12/13/2025   Comprehensive metabolic panel with GFR    Standing Status:   Future    Expiration Date:   12/13/2025   Ambulatory referral to Gastroenterology    Referral Priority:   Routine    Referral Reason:   Specialty Services Required    Number of Visits Requested:   1   ED Discharge Orders          Ordered    Cdiff NAA+O+P+Stool Culture        12/13/24 1030    C. difficile GDH and Toxin A/B       Comments: C. diff GDH and toxin A/B test:Method: Enzyme immunoassay (EIA)Detects: Glutamate dehydrogenase (GDH) antigen and C. diff toxins A and BTurnaround time: Rapid (hours)Sensitivity/specificity: ModerateWhen to order:Initial screening testIn settings with limited resourcesWhen rapid results are neededC. diff PCR test:Method: Polymerase chain reaction (PCR)Detects: Toxin genes (usually tcdB)Turnaround time: Longer (1-2 days)Sensitivity/specificity: HighWhen to order:Confirmatory test after positive GDH/negative toxin resultIn high-risk patients or during outbreaksWhen highest accuracy is requiredKey differences:Detection target: Antigens/toxins vs. genesAccuracy: PCR is more sensitive and specificTime and cost: GDH/toxin test is faster and cheaperMany labs use a two- or three-step algorithm, starting with GDH/toxin test and reflexing  to PCR if needed. The choice depends on clinical context, lab capabilities, and institutional protocols.    12/13/24 1030    CBC with Differential/Platelet        12/13/24 1030    Comprehensive metabolic panel with GFR        12/13/24 1030    Pneumococcal conjugate vaccine 20-valent (Prevnar 20)        12/13/24 1030    famotidine -calcium  carbonate-magnesium   hydroxide (PEPCID  COMPLETE) 10-800-165 MG chewable tablet  2 times daily PRN        12/13/24 1030    Ambulatory referral to Gastroenterology       Comments: Patient would like transfer of care from digestive health specialist Has chronic dysphagia recurring history of dilation with Dr. Aneita and more recently suspected aspiration pneumonia associated with C.diff.  Evaluate for esophagoduodenoscopy.   12/13/24 1030                Treatment plan discussed and reviewed in detail. Explained medication safety and potential side effects.  Answered all patient questions and confirmed understanding and comfort with the plan. Encouraged patient to contact our office if they have any questions or concerns.  Agreed on patient coming for a sooner office visit if symptoms worsen, persist, or new symptoms develop. Discussed precautions in case of needing to visit the Emergency Department.    ----------------------------------------------------- Attestation:  Today's Healthcare Provider Bernardino KANDICE Cone, MD was located at office at [facility] at [facility] [address], [place] [address].  The patient was located at home. All video encounter participant identities and locations confirmed visually and verbally.Today's Telemedicine visit was conducted via synchronous Video after consent for telemedicine was obtained:  Video connection was never lost... Except for a 30=60 second cutout when someone else called in.   This document was transcribed and resynthesized, in part, by artificial intelligence ([employer]bridge)  using HIPAA-compliant recording of the clinical interaction;   We have discussed the our use of AI scribe software for clinical note transcription with the patient, who has given verbal consent to proceed.

## 2024-12-14 ENCOUNTER — Ambulatory Visit: Payer: Self-pay | Admitting: Internal Medicine

## 2024-12-15 ENCOUNTER — Ambulatory Visit: Admitting: Family Medicine

## 2024-12-19 ENCOUNTER — Ambulatory Visit: Admitting: Gastroenterology

## 2024-12-20 ENCOUNTER — Inpatient Hospital Stay: Admitting: Internal Medicine

## 2024-12-21 ENCOUNTER — Other Ambulatory Visit (HOSPITAL_BASED_OUTPATIENT_CLINIC_OR_DEPARTMENT_OTHER): Payer: Self-pay

## 2024-12-23 ENCOUNTER — Ambulatory Visit: Admitting: Internal Medicine

## 2024-12-29 ENCOUNTER — Ambulatory Visit: Admitting: Family Medicine

## 2025-01-03 ENCOUNTER — Ambulatory Visit: Admitting: Neurosurgery

## 2025-01-12 ENCOUNTER — Ambulatory Visit: Admitting: Family Medicine

## 2025-03-15 ENCOUNTER — Ambulatory Visit: Admitting: Neurology
